# Patient Record
Sex: Female | Born: 1940 | Race: White | Hispanic: No | Smoking: Former smoker
Health system: Southern US, Community
[De-identification: ages and names within clinical notes are randomized; demographics above are authoritative.]

## PROBLEM LIST (undated history)

## (undated) DIAGNOSIS — I2510 Atherosclerotic heart disease of native coronary artery without angina pectoris: Secondary | ICD-10-CM

## (undated) DIAGNOSIS — Z9221 Personal history of antineoplastic chemotherapy: Secondary | ICD-10-CM

## (undated) DIAGNOSIS — K219 Gastro-esophageal reflux disease without esophagitis: Secondary | ICD-10-CM

## (undated) DIAGNOSIS — Z923 Personal history of irradiation: Secondary | ICD-10-CM

## (undated) DIAGNOSIS — M1990 Unspecified osteoarthritis, unspecified site: Secondary | ICD-10-CM

## (undated) DIAGNOSIS — F329 Major depressive disorder, single episode, unspecified: Secondary | ICD-10-CM

## (undated) DIAGNOSIS — E785 Hyperlipidemia, unspecified: Secondary | ICD-10-CM

## (undated) DIAGNOSIS — J449 Chronic obstructive pulmonary disease, unspecified: Secondary | ICD-10-CM

## (undated) DIAGNOSIS — R0600 Dyspnea, unspecified: Secondary | ICD-10-CM

## (undated) DIAGNOSIS — I10 Essential (primary) hypertension: Secondary | ICD-10-CM

## (undated) DIAGNOSIS — F32A Depression, unspecified: Secondary | ICD-10-CM

## (undated) DIAGNOSIS — I5022 Chronic systolic (congestive) heart failure: Secondary | ICD-10-CM

## (undated) DIAGNOSIS — F419 Anxiety disorder, unspecified: Secondary | ICD-10-CM

## (undated) DIAGNOSIS — C801 Malignant (primary) neoplasm, unspecified: Secondary | ICD-10-CM

## (undated) HISTORY — DX: Chronic systolic (congestive) heart failure: I50.22

## (undated) HISTORY — DX: Essential (primary) hypertension: I10

## (undated) HISTORY — DX: Atherosclerotic heart disease of native coronary artery without angina pectoris: I25.10

## (undated) HISTORY — PX: DILATION AND CURETTAGE OF UTERUS: SHX78

## (undated) HISTORY — DX: Chronic obstructive pulmonary disease, unspecified: J44.9

---

## 1988-04-24 HISTORY — PX: ABDOMINAL HYSTERECTOMY: SHX81

## 2014-04-08 ENCOUNTER — Emergency Department: Payer: Self-pay | Admitting: Emergency Medicine

## 2014-04-08 LAB — CBC
HCT: 42.6 % (ref 35.0–47.0)
HGB: 13.5 g/dL (ref 12.0–16.0)
MCH: 28.3 pg (ref 26.0–34.0)
MCHC: 31.7 g/dL — ABNORMAL LOW (ref 32.0–36.0)
MCV: 89 fL (ref 80–100)
Platelet: 267 10*3/uL (ref 150–440)
RBC: 4.76 10*6/uL (ref 3.80–5.20)
RDW: 14.8 % — ABNORMAL HIGH (ref 11.5–14.5)
WBC: 13.7 10*3/uL — AB (ref 3.6–11.0)

## 2014-04-08 LAB — BASIC METABOLIC PANEL
Anion Gap: 6 — ABNORMAL LOW (ref 7–16)
BUN: 11 mg/dL (ref 7–18)
CO2: 28 mmol/L (ref 21–32)
Calcium, Total: 8.8 mg/dL (ref 8.5–10.1)
Chloride: 107 mmol/L (ref 98–107)
Creatinine: 1.09 mg/dL (ref 0.60–1.30)
EGFR (African American): >60
EGFR (Non-African Amer.): 52 — ABNORMAL LOW
Glucose: 95 mg/dL (ref 65–99)
Osmolality: 280 (ref 275–301)
Potassium: 3.9 mmol/L (ref 3.5–5.1)
Sodium: 141 mmol/L (ref 136–145)

## 2014-04-08 LAB — PRO B NATRIURETIC PEPTIDE: B-TYPE NATIURETIC PEPTID: 407 pg/mL — AB (ref 0–125)

## 2014-04-08 LAB — TROPONIN I
Troponin-I: 0.03 ng/mL
Troponin-I: 0.03 ng/mL

## 2014-04-08 IMAGING — CR DG CHEST 2V
1 series · 2 of 2 positions shown · non-contrast
Comparison: None.

CLINICAL DATA: 73-year-old female with right side chest pain since
[REDACTED], pain now radiating to the left. Initial encounter.

EXAM:
CHEST  2 VIEW

[Series 1: dxr chest pa (or ap) and lateral · 0.14mm/px · 2 of 2 slices shown]
[im 1/2]
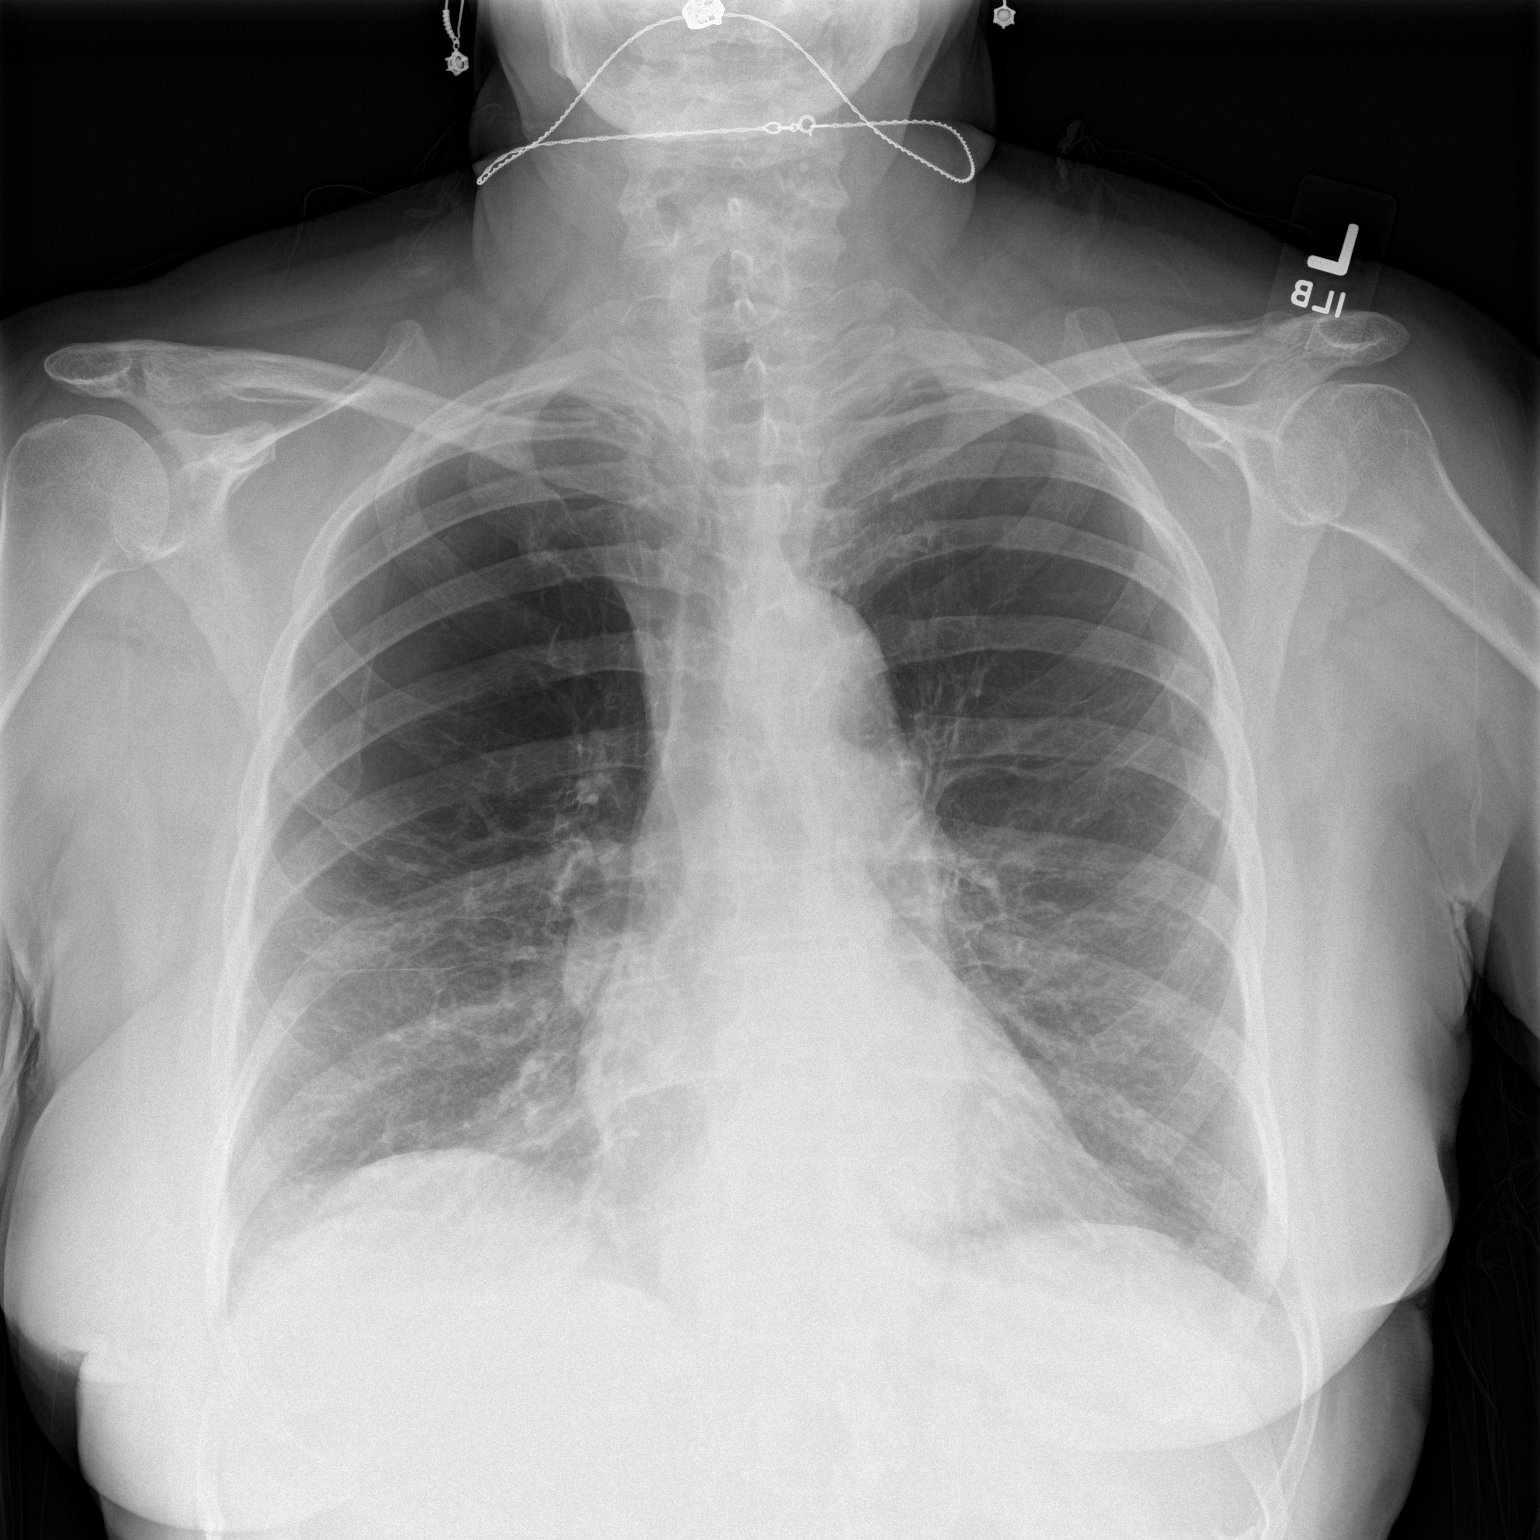
[im 2/2]
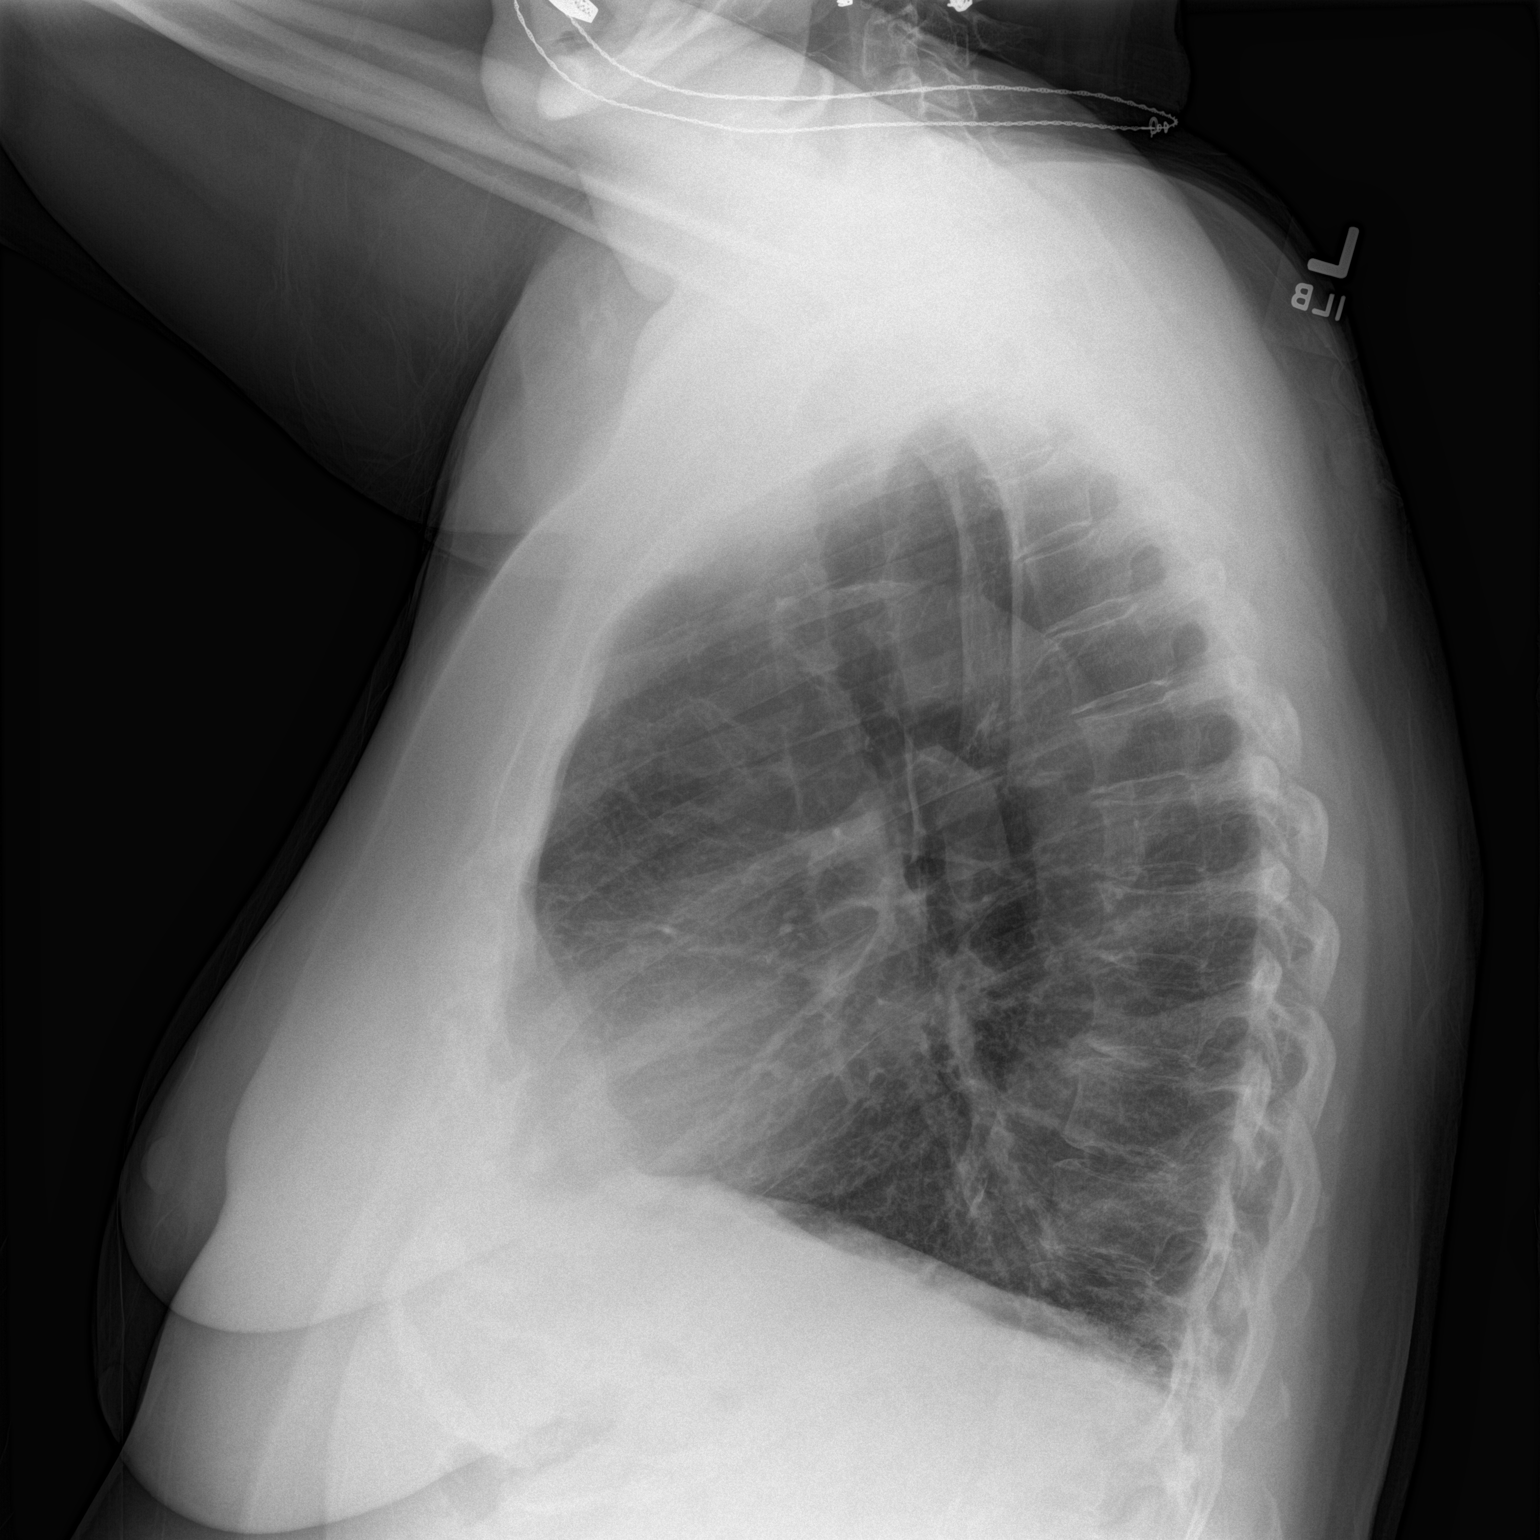

[2 of 2 positions shown; findings below may reference images not displayed]

FINDINGS: Attenuation of upper lobe bronchovascular markings compatible with
emphysema. Somewhat large lung volumes with flattening of the
diaphragm on the lateral view. Normal cardiac size and mediastinal
contours. Visualized tracheal air column is within normal limits. No
pneumothorax, pulmonary edema, pleural effusion or confluent
pulmonary opacity. Calcified atherosclerosis of the aorta.
Osteopenia. No acute osseous abnormality identified.
IMPRESSION: Emphysema.  No acute cardiopulmonary abnormality.

## 2014-04-08 IMAGING — CT CT ANGIO CHEST
2 of 6 series · 18 of 36 positions shown · IV contrast (APPLIED)
Comparison: Chest radiographs [T8] hr today.

CLINICAL DATA: 73-year-old female with chest pain radiating to the
right upper extremity. Shortness of Breath. Initial encounter.

EXAM:
CT ANGIOGRAPHY CHEST WITH CONTRAST
TECHNIQUE: Multidetector CT imaging of the chest was performed using the
standard protocol during bolus administration of intravenous
contrast. Multiplanar CT image reconstructions and MIPs were
obtained to evaluate the vascular anatomy.
CONTRAST:  100 mL Isovue 370

[Series 6: pe 1.0 thins · axial · 0.68mm/px · z∈[+652,+918]mm · 17 of 301 slices shown]
[im 17/301  lung]
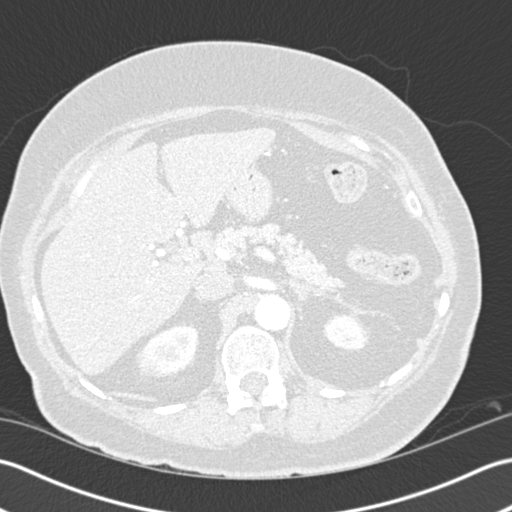
[im 34/301  mediastinal]
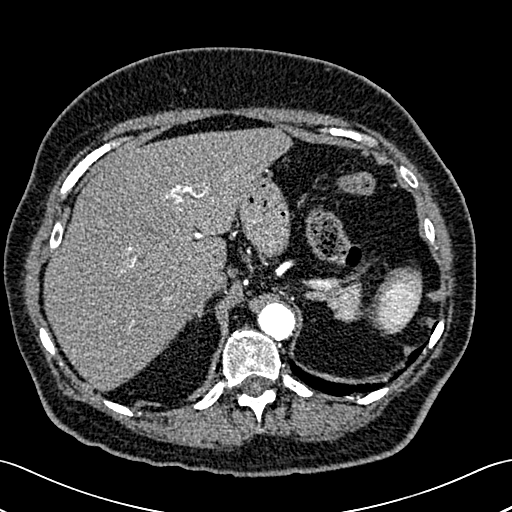
[im 51/301  lung]
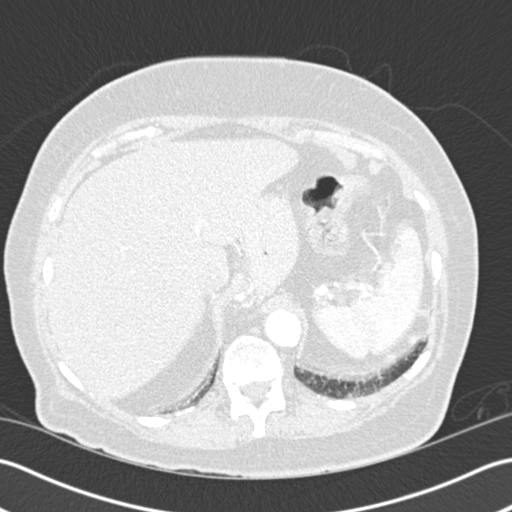
[im 67/301  mediastinal]
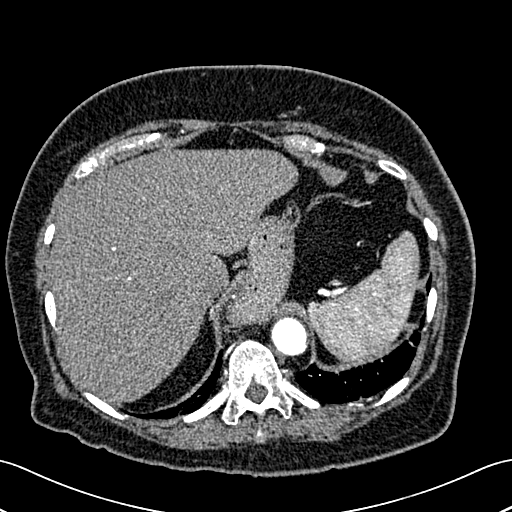
[im 84/301  lung]
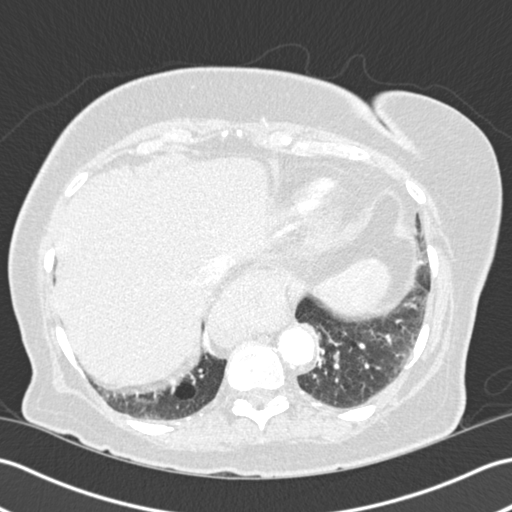
[im 101/301  mediastinal]
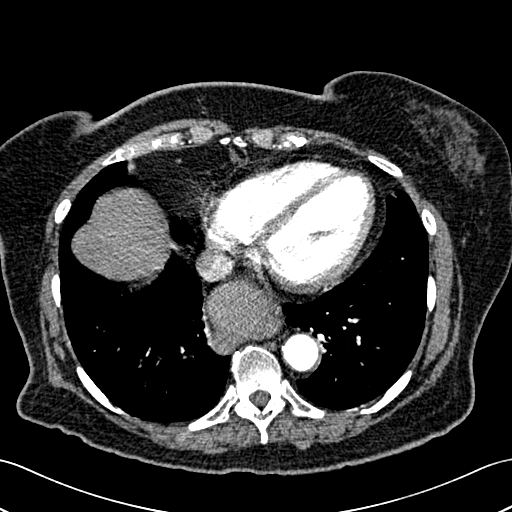
[im 117/301  lung]
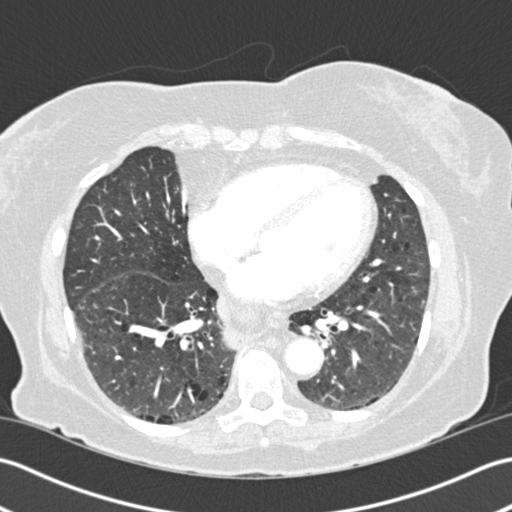
[im 134/301  mediastinal]
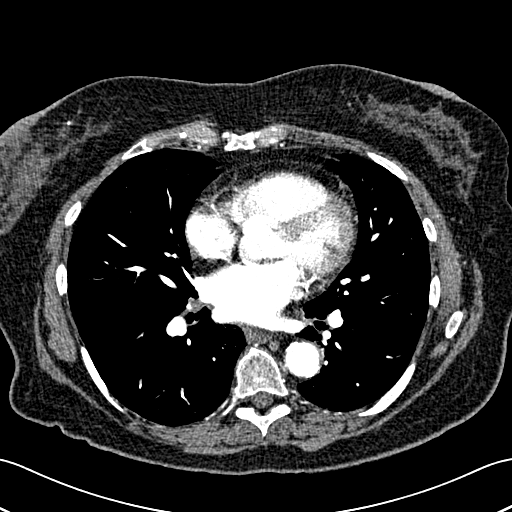
[im 151/301  lung]
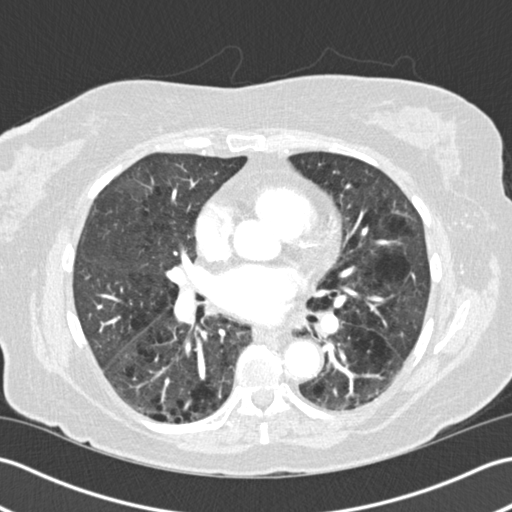
[im 167/301  mediastinal]
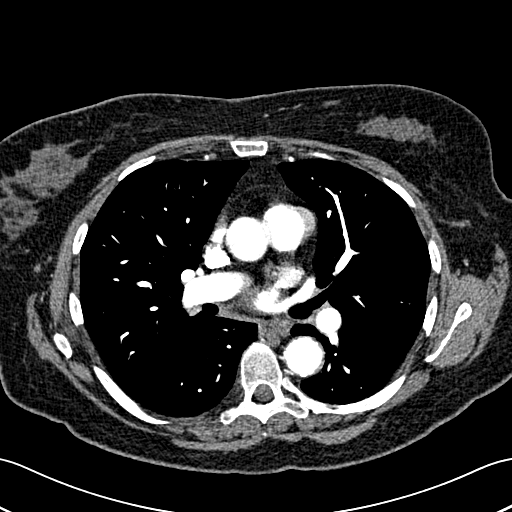
[im 184/301  lung]
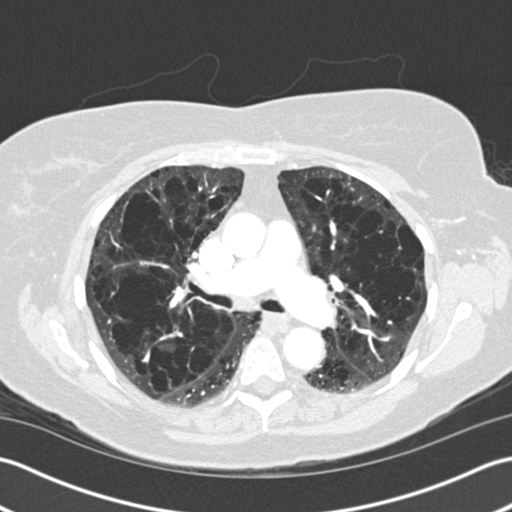
[im 201/301  mediastinal]
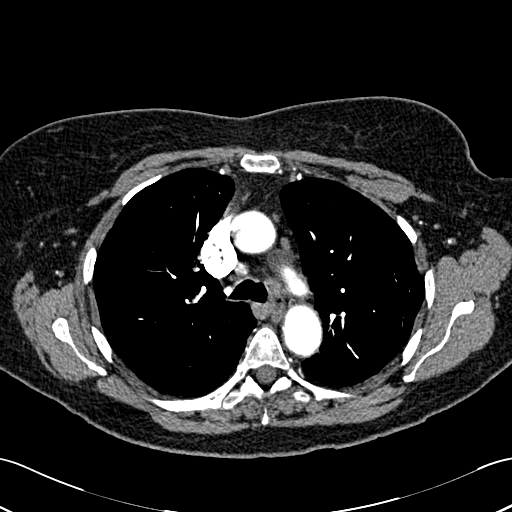
[im 217/301  lung]
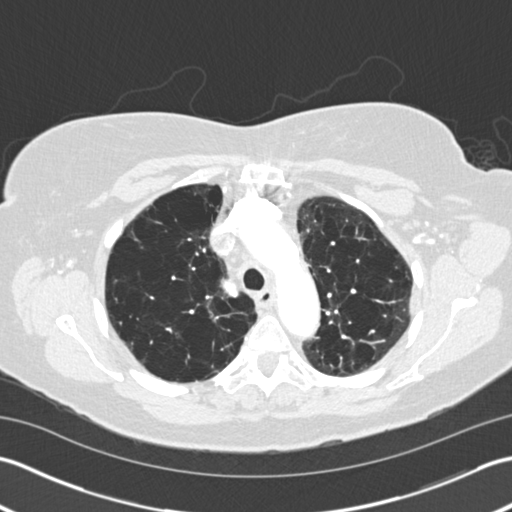
[im 234/301  mediastinal]
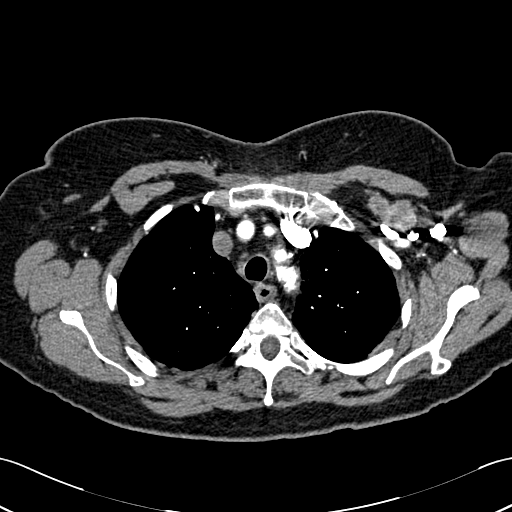
[im 251/301  lung]
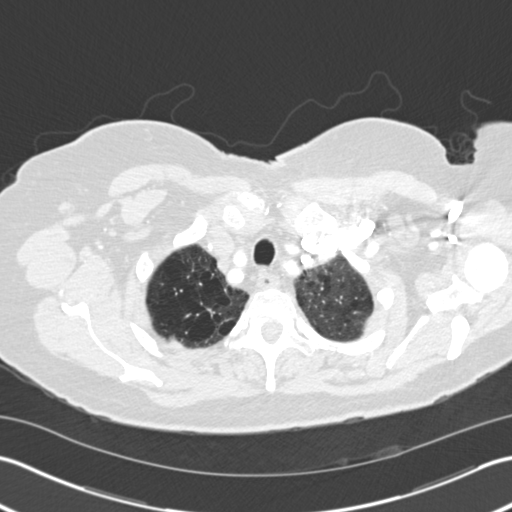
[im 267/301  mediastinal]
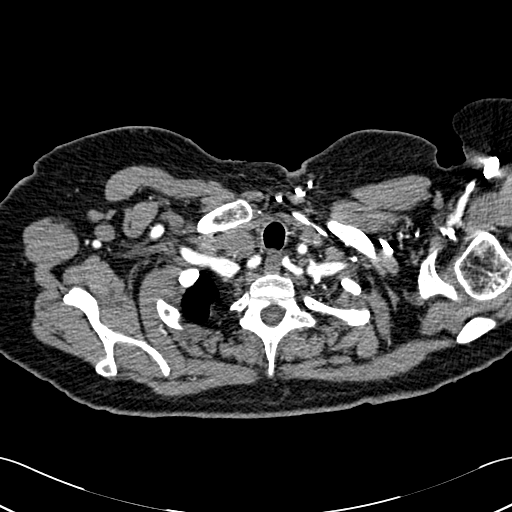
[im 284/301  lung]
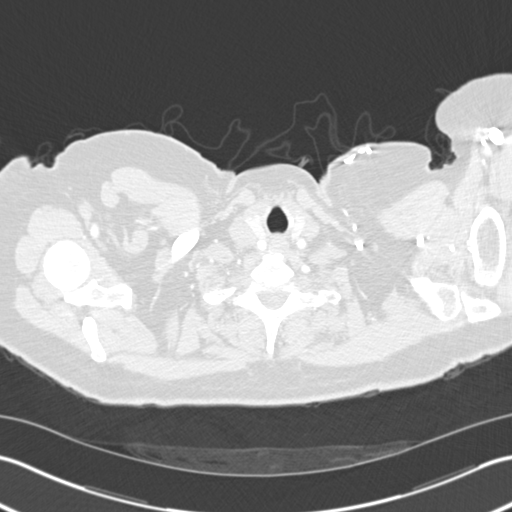

[Series 8: cor pe 2.0 mpr · coronal · 0.59mm/px · 1 of 152 slices shown]
[im 76/152  mediastinal]
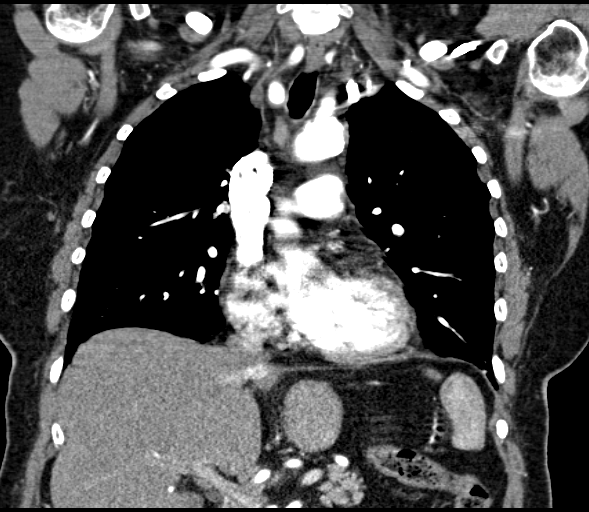

[18 of 36 positions shown; findings below may reference images not displayed]

FINDINGS: Good contrast bolus timing in the pulmonary arterial tree. Motion
artifact in the lower lobes. Some enlargement of central pulmonary
arteries with fairly rapid taper ring, especially in the upper
lobes. No focal filling defect identified in the pulmonary arterial
tree to suggest the presence of acute pulmonary embolism.

Bullous emphysema in both upper lungs. Centrilobular emphysema
elsewhere. Major airways are patent. Crowding of markings in the
costophrenic angles most resembles atelectasis. However, there are
tree-in-bud opacities in the lateral basal segment of the right
lower lobe (series 7, image 94). No consolidation. Calcified
granuloma in the right lung on series 7, image 78. 4 mm subpleural
nodule in the left costophrenic angle on image 114. No other
pulmonary nodule identified.

Mildly increased hilar nodal tissue. No pericardial or pleural
effusion. Mildly increased mediastinal nodes, appear reactive.
Negative thoracic inlet. Moderate-sized retrocardiac hiatal hernia.
Calcified atherosclerosis of the aorta and its branches, including
the proximal great vessels and the coronary arteries.

No axillary lymphadenopathy. Visible liver, gallbladder, spleen,
pancreas, adrenal glands, kidneys, and large bowel in the upper
abdomen are within normal limits.

No acute osseous abnormality identified.

Review of the MIP images confirms the above findings.
IMPRESSION: 1.  No evidence of acute pulmonary embolus.
2. Emphysema is moderate to severe with bullous disease in both
upper lobes. Tree-in-bud nodularity in the lateral basal segment of
the right lower lobe suspicious for acute distal airway infection.
3. Four mm left costophrenic angle nodule. Follow-up chest CT at 1
year is recommended. This recommendation follows the consensus
statement: Guidelines for Management of Small Pulmonary Nodules
Detected on CT Scans: A Statement from the [HOSPITAL] as
published in Radiology [T8]; [DATE].
4. Aortic and Coronary calcified atherosclerosis.

## 2014-04-09 ENCOUNTER — Ambulatory Visit: Payer: Self-pay | Admitting: Family Medicine

## 2014-04-09 IMAGING — CT CT ABD-PELV W/ CM
2 of 5 series · 16 of 46 positions shown, 18 images · IV contrast (omnipaque)
Comparison: None.

CLINICAL DATA: Left upper quadrant pain radiating to right upper
quadrant

EXAM:
CT ABDOMEN AND PELVIS WITH CONTRAST
TECHNIQUE: Multidetector CT imaging of the abdomen and pelvis was performed
using the standard protocol following bolus administration of
intravenous contrast.
CONTRAST:  100 cc Omnipaque 300

[Series 3: routine abd pel with · axial · 0.76mm/px · z∈[-436,-10]mm · 13 of 97 slices shown, 15 images]
[im 6/97  soft-tissue]
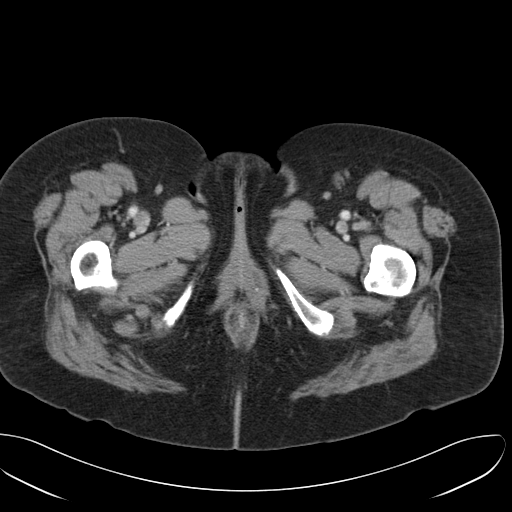
[im 6/97  bone]
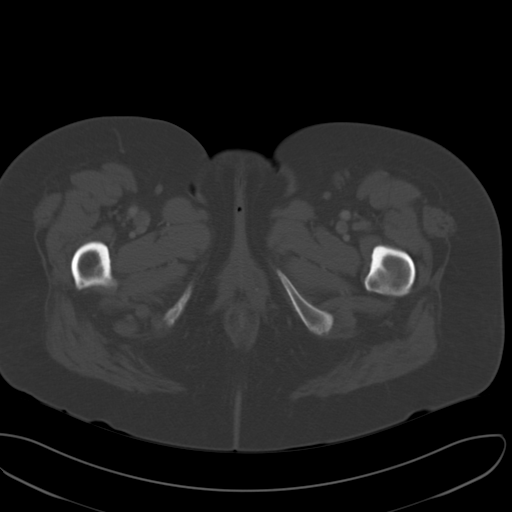
[im 16/97  soft-tissue]
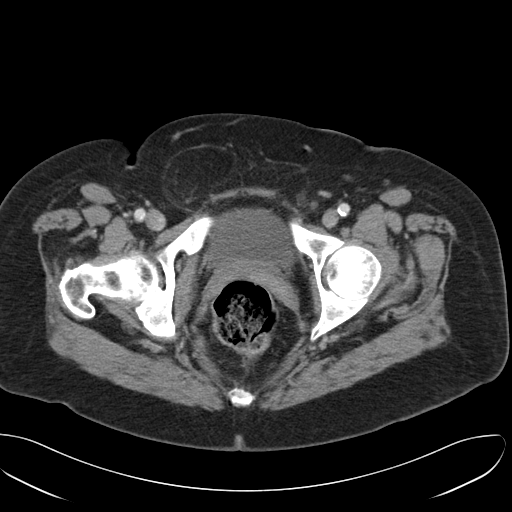
[im 21/97  soft-tissue]
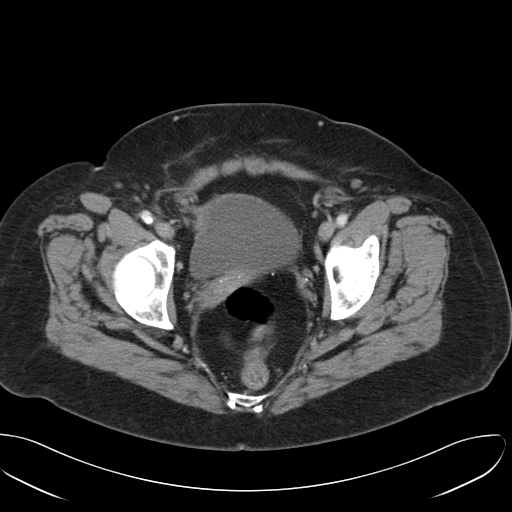
[im 26/97  soft-tissue]
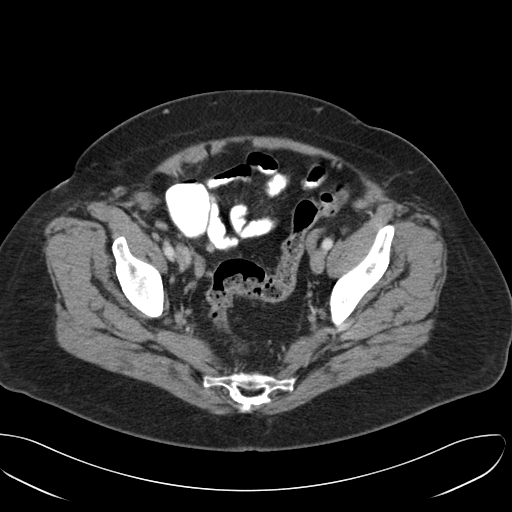
[im 36/97  soft-tissue]
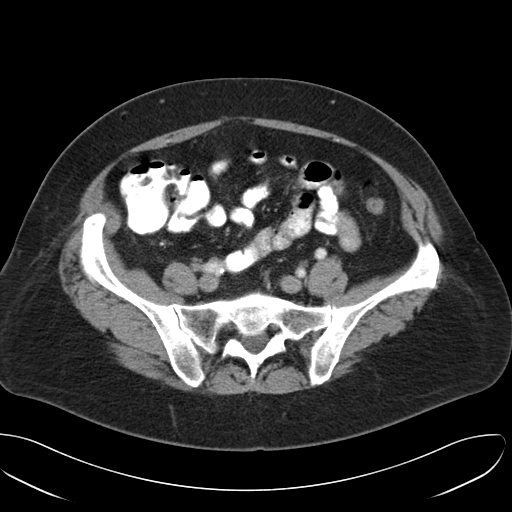
[im 41/97  soft-tissue]
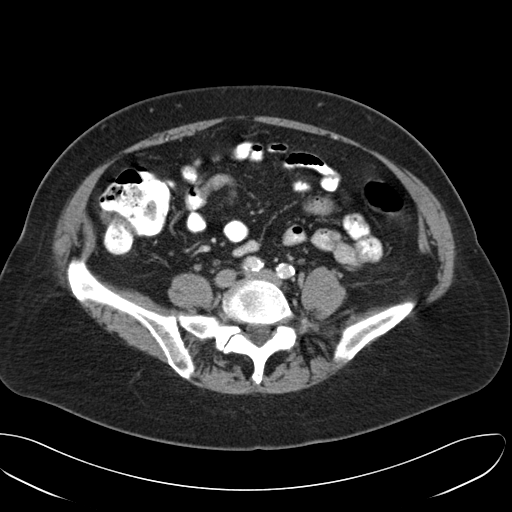
[im 51/97  soft-tissue]
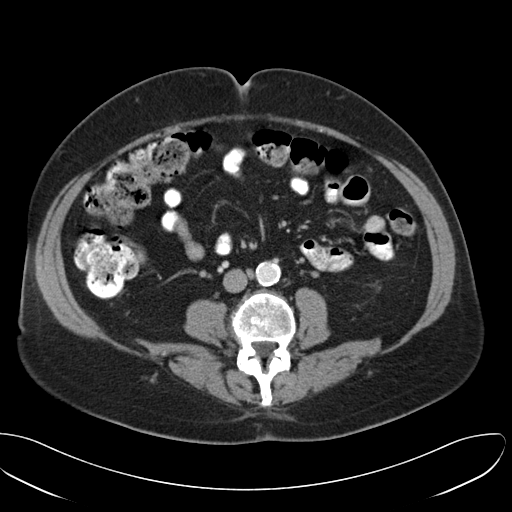
[im 56/97  soft-tissue]
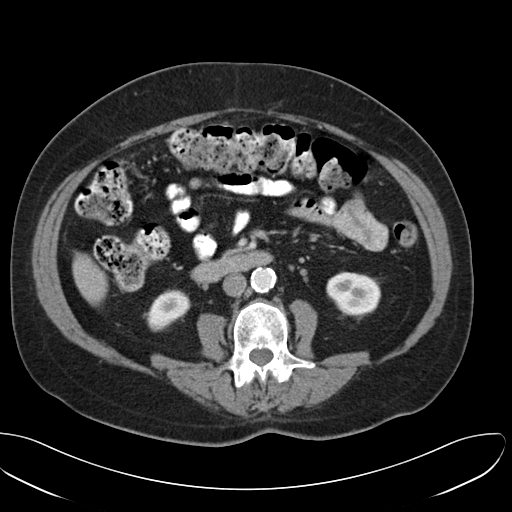
[im 61/97  soft-tissue]
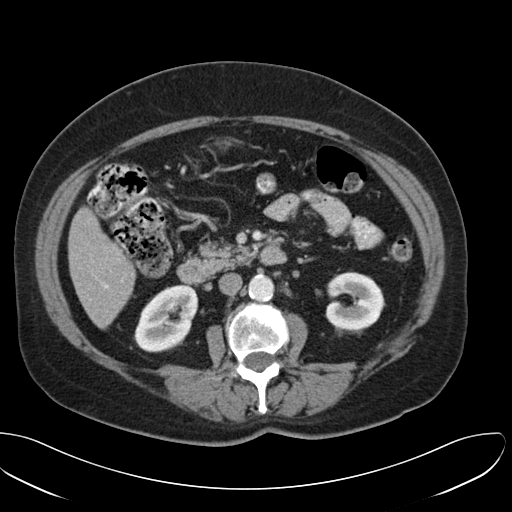
[im 61/97  bone]
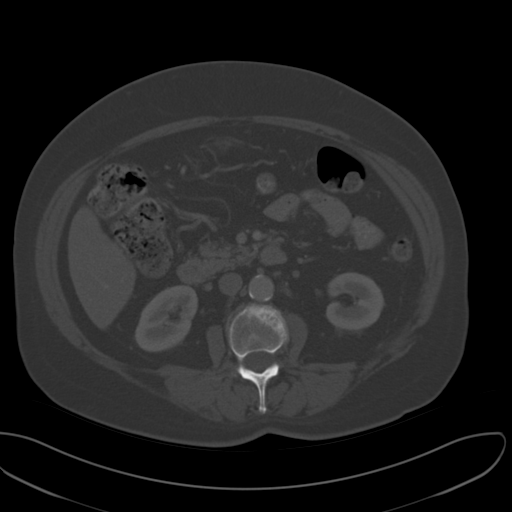
[im 71/97  soft-tissue]
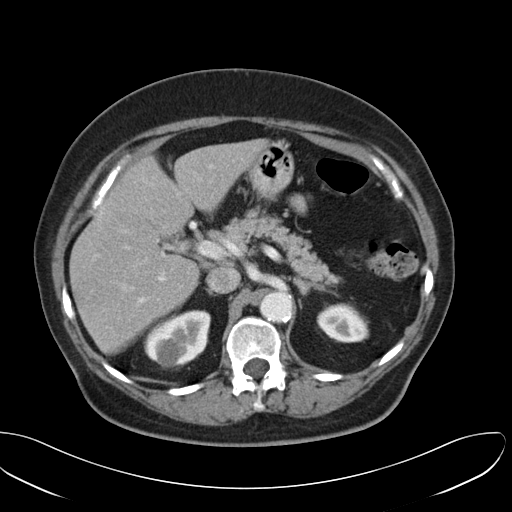
[im 76/97  soft-tissue]
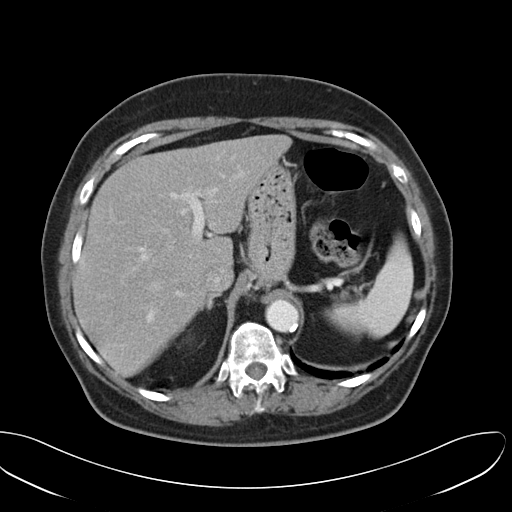
[im 81/97  soft-tissue]
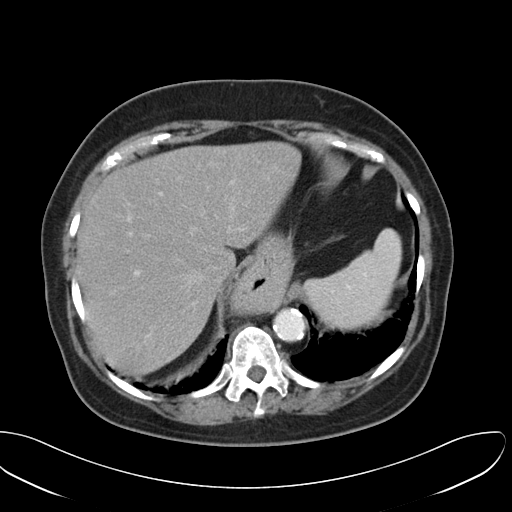
[im 91/97  soft-tissue]
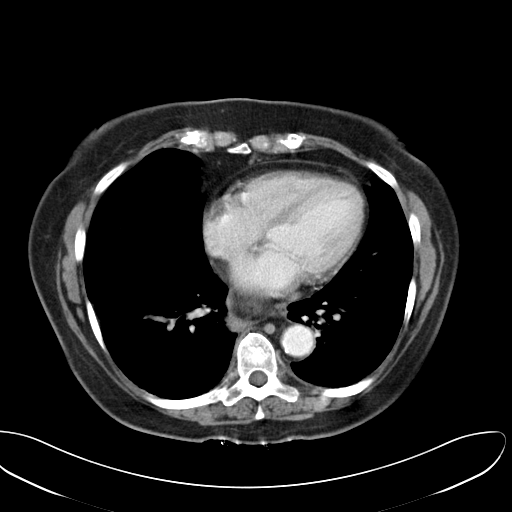

[Series 6: cor routine abd pel with · coronal · 0.77mm/px · 3 of 176 slices shown]
[im 59/176  soft-tissue]
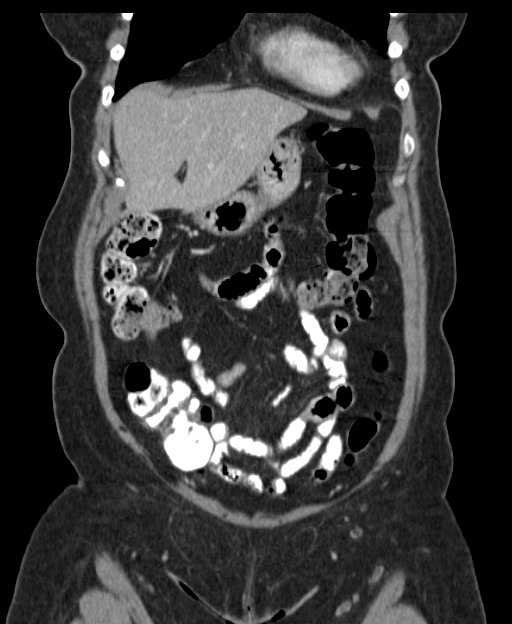
[im 78/176  soft-tissue]
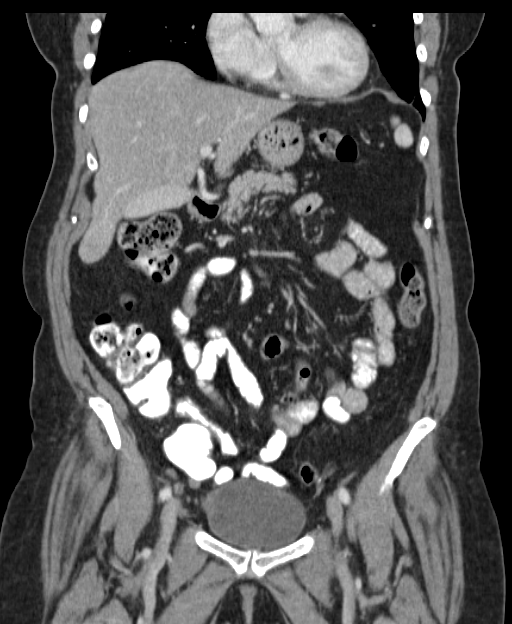
[im 98/176  soft-tissue]
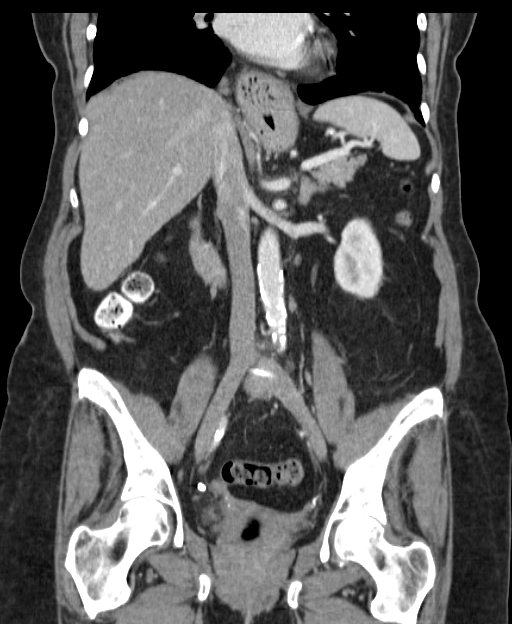

[16 of 46 positions shown; findings below may reference images not displayed]

FINDINGS: Changes of centrilobular emphysema are noted at the lung bases.
Coronary artery calcifications are present. A moderate size hiatal
hernia is noted. The liver enhances and is somewhat low in
attenuation possibly indicating mild fatty infiltration. Some
contrast is noted within the gallbladder which may indicate
vicarious excretion although a small gallstones are difficult to
exclude. No gallbladder wall thickening is seen. The pancreas is
normal in size in the pancreatic duct is not dilated. The adrenal
glands and spleen are unremarkable. The more distal stomach is
decompressed. The kidneys enhance, and there does appear to be a
cyst in the upper pole of the right kidney of 2.0 cm with
attenuation of 21 HU. No solid renal lesion is seen. On delayed
images the pelvocaliceal systems are unremarkable. The proximal
ureters are normal in caliber pre the abdominal aorta is normal in
caliber with moderate atheromatous change present. No aneurysm is
noted. No adenopathy is seen.

The urinary bladder is unremarkable. The uterus appears to have been
resected. No adnexal lesion is seen. No fluid is noted within the
pelvis. Fat does enter the right inguinal canal. The terminal ileum
and the appendix are unremarkable. Diffuse degenerative disc disease
is noted in the lumbar spine.
IMPRESSION: 1. Moderate sized hiatal hernia.
2. Somewhat low-attenuation of the liver may indicate fatty
infiltration. Correlate with LFTs.
3. Probable vicarious excretion of contrast into the gallbladder.
Small gallstones cannot be excluded.
4. Centrilobular emphysema.
5. Fat enters the right inguinal canal.

## 2014-04-10 DIAGNOSIS — E669 Obesity, unspecified: Secondary | ICD-10-CM | POA: Insufficient documentation

## 2014-04-10 DIAGNOSIS — F329 Major depressive disorder, single episode, unspecified: Secondary | ICD-10-CM | POA: Insufficient documentation

## 2014-04-10 DIAGNOSIS — J309 Allergic rhinitis, unspecified: Secondary | ICD-10-CM | POA: Insufficient documentation

## 2014-04-10 DIAGNOSIS — F32A Depression, unspecified: Secondary | ICD-10-CM | POA: Insufficient documentation

## 2014-04-28 ENCOUNTER — Ambulatory Visit: Payer: Self-pay | Admitting: Family Medicine

## 2014-04-28 IMAGING — US ABDOMEN ULTRASOUND LIMITED
1 series · 14 of 25 positions shown · non-contrast
Comparison: CT scan of the abdomen pelvis of [DATE]

CLINICAL DATA: Right upper quadrant pain evaluate for gallstones
suspected on prior CT scan.

EXAM:
US ABDOMEN LIMITED - RIGHT UPPER QUADRANT

[Series 1: abdomen ultrasound limited · 0.21mm/px · 14 of 48 slices shown]
[im 1/48]
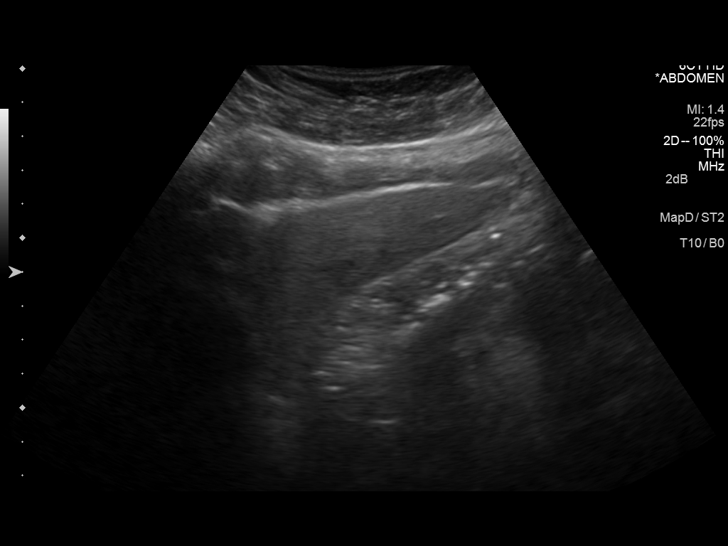
[im 4/48]
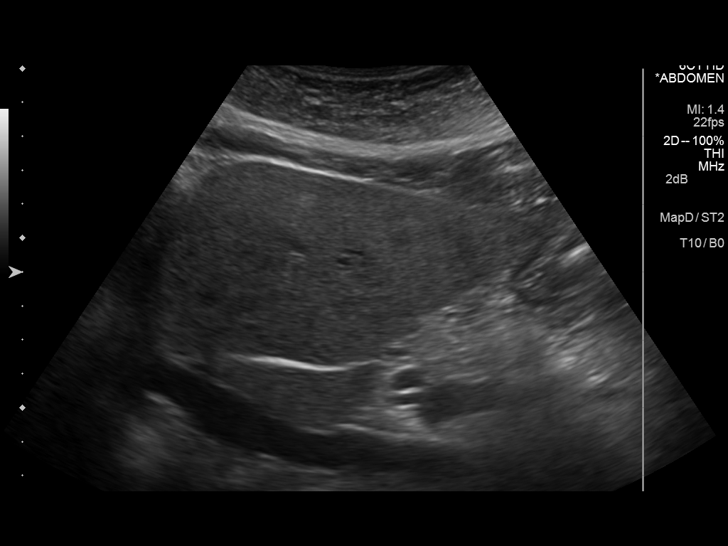
[im 8/48]
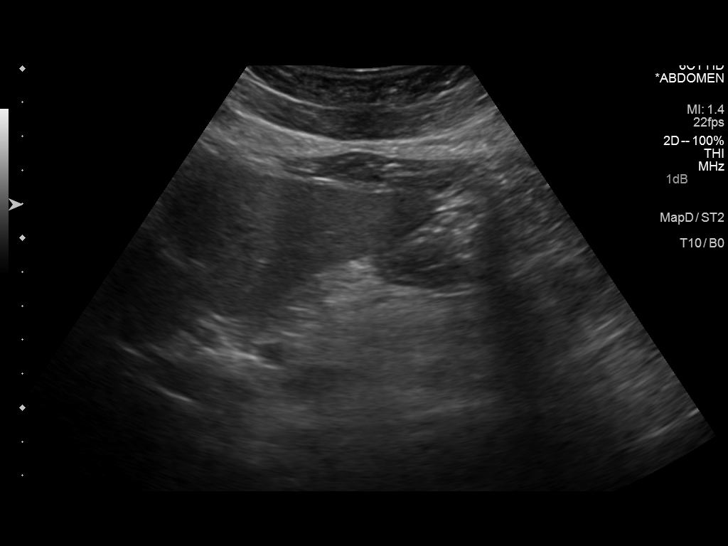
[im 12/48]
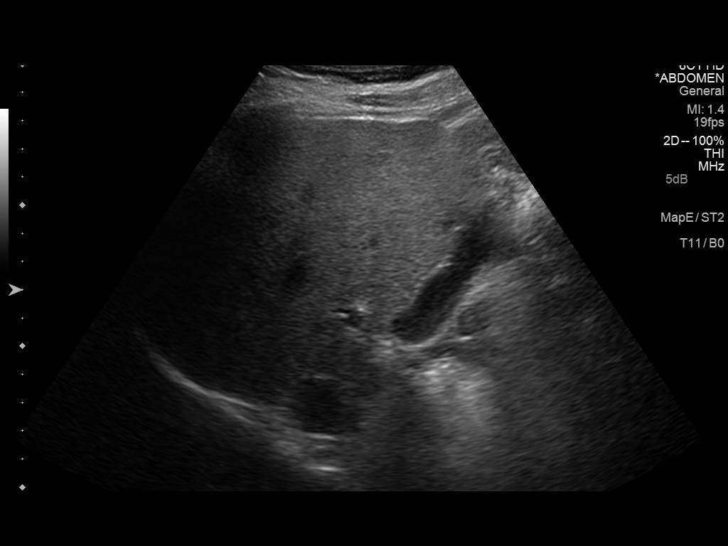
[im 16/48]
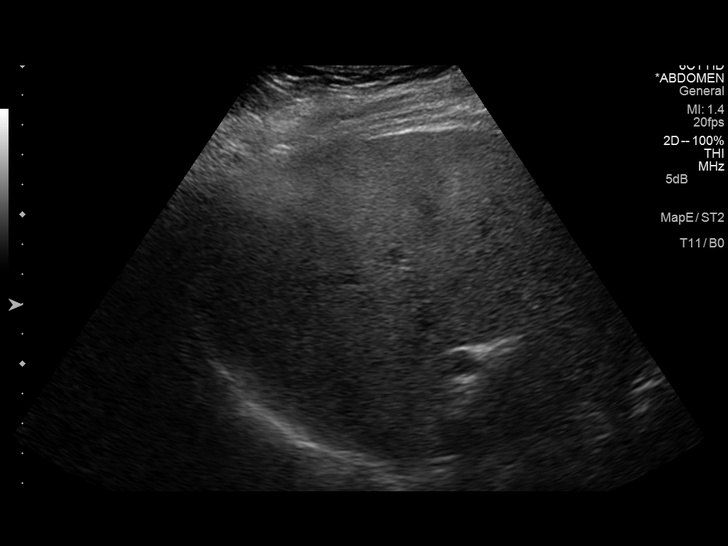
[im 18/48]
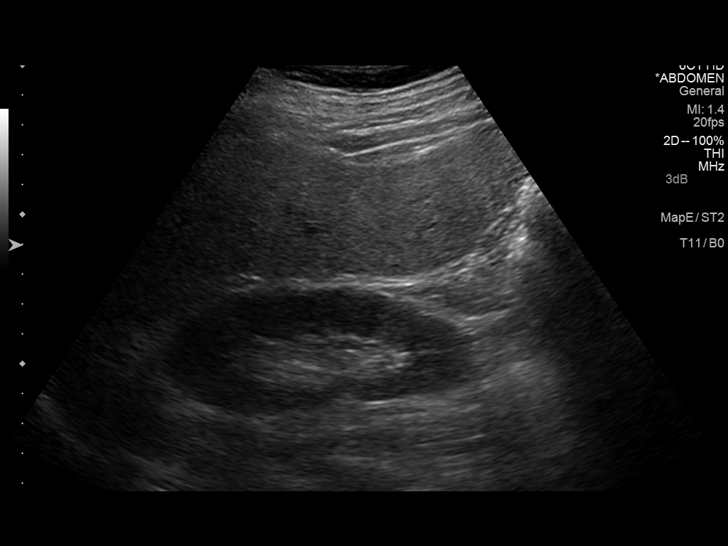
[im 22/48]
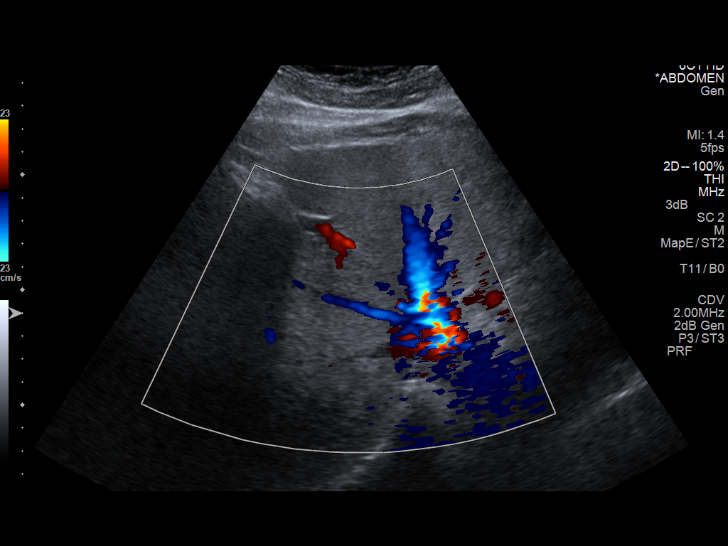
[im 26/48]
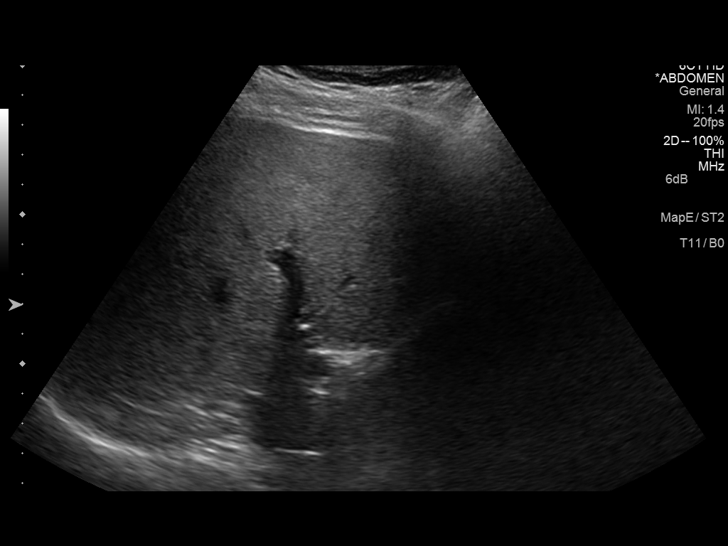
[im 30/48]
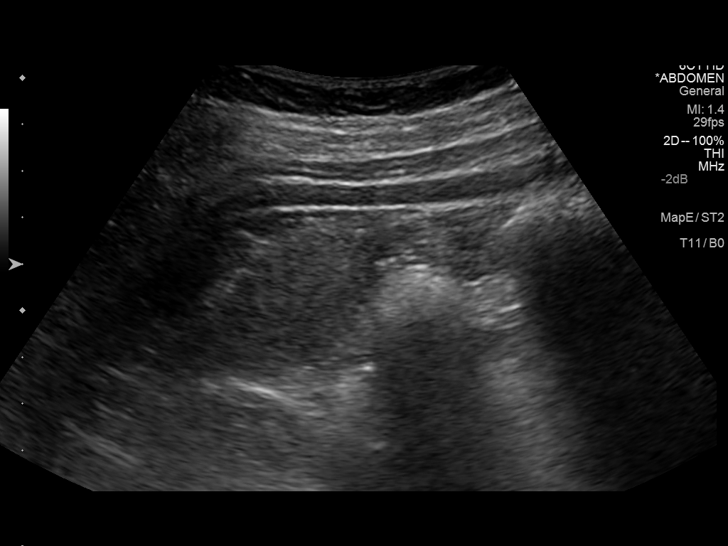
[im 32/48]
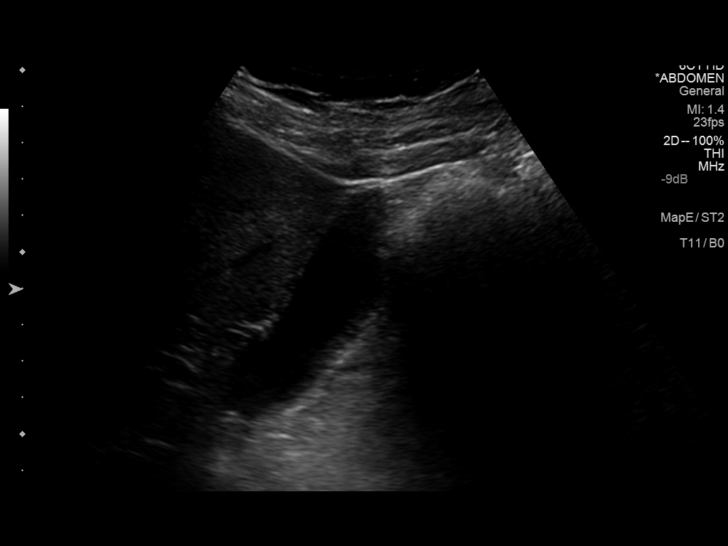
[im 36/48]
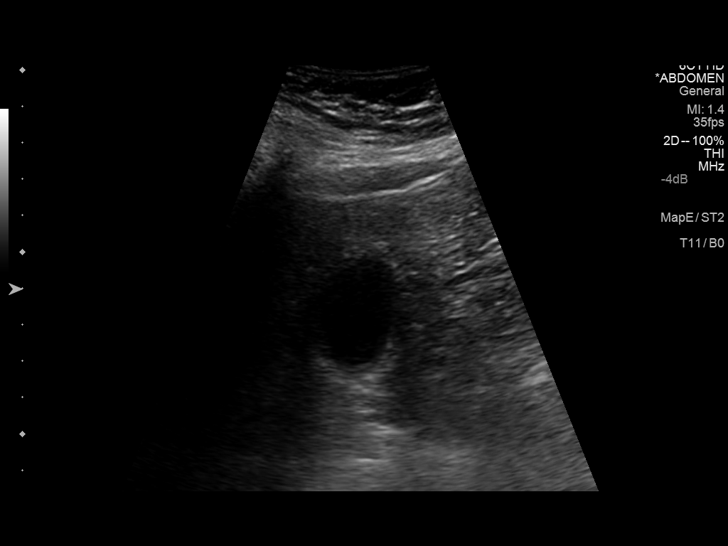
[im 40/48]
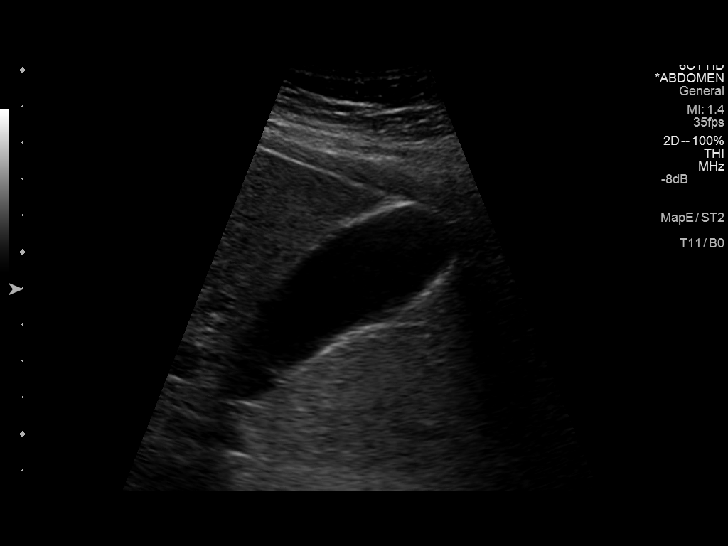
[im 44/48]
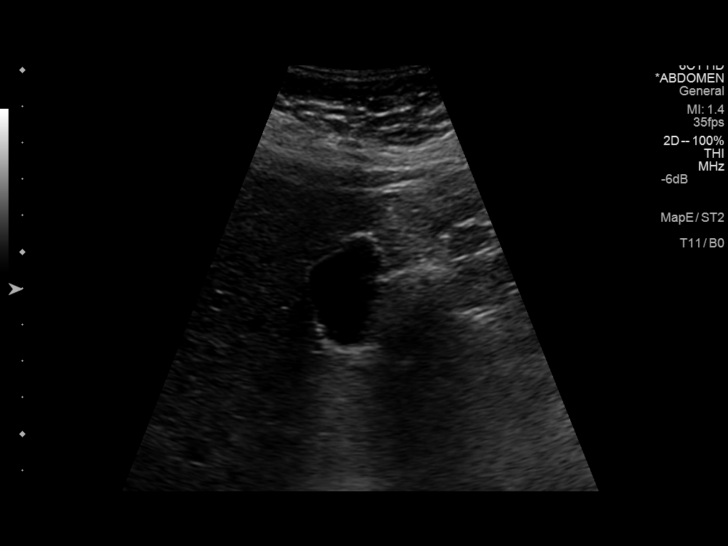
[im 48/48]
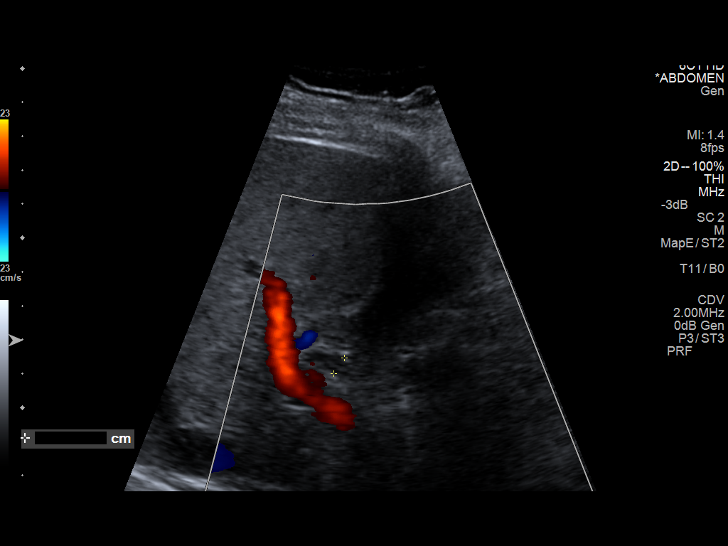

[14 of 25 positions shown; findings below may reference images not displayed]

FINDINGS: Gallbladder:

The gallbladder is adequately distended with no evidence of stones,
wall thickening, or pericholecystic fluid. There is no positive
sonographic Murphy's sign.

Common bile duct:

Diameter: 5.6 mm

Liver:

The liver exhibits increased echotexture diffusely a. There is no
focal mass or ductal dilation.
IMPRESSION: 1. No gallstones or other gallbladder pathology is demonstrated.
2. There is likely fatty infiltrative change of the liver.

## 2014-12-15 ENCOUNTER — Other Ambulatory Visit: Payer: Self-pay

## 2014-12-15 MED ORDER — DULOXETINE HCL 60 MG PO CPEP: 60.0000 mg | ORAL_CAPSULE | Freq: Every day | ORAL | Status: DC

## 2014-12-15 NOTE — Telephone Encounter (Signed)
Patient was last seen on 01/28/14, practice partner number is [phone], and pharmacy is [facility] in [place].

## 2014-12-15 NOTE — Telephone Encounter (Signed)
Needs seen further refills 

## 2014-12-16 NOTE — Telephone Encounter (Signed)
Called and left voicemail for pt to call back and schedule a follow up appointment.

## 2015-04-02 ENCOUNTER — Other Ambulatory Visit: Payer: Self-pay | Admitting: Unknown Physician Specialty

## 2015-04-19 ENCOUNTER — Emergency Department
Admission: EM | Admit: 2015-04-19 | Discharge: 2015-04-19 | Disposition: A | Discharge status: Home / Self Care | Payer: Medicare PPO | Attending: Emergency Medicine | Admitting: Emergency Medicine

## 2015-04-19 ENCOUNTER — Emergency Department: Payer: Medicare PPO

## 2015-04-19 ENCOUNTER — Encounter: Payer: Self-pay | Admitting: Emergency Medicine

## 2015-04-19 DIAGNOSIS — Z79899 Other long term (current) drug therapy: Secondary | ICD-10-CM | POA: Diagnosis not present

## 2015-04-19 DIAGNOSIS — J069 Acute upper respiratory infection, unspecified: Secondary | ICD-10-CM | POA: Diagnosis not present

## 2015-04-19 DIAGNOSIS — J439 Emphysema, unspecified: Secondary | ICD-10-CM | POA: Insufficient documentation

## 2015-04-19 DIAGNOSIS — Z87891 Personal history of nicotine dependence: Secondary | ICD-10-CM | POA: Insufficient documentation

## 2015-04-19 DIAGNOSIS — J988 Other specified respiratory disorders: Secondary | ICD-10-CM

## 2015-04-19 DIAGNOSIS — R030 Elevated blood-pressure reading, without diagnosis of hypertension: Secondary | ICD-10-CM | POA: Diagnosis not present

## 2015-04-19 DIAGNOSIS — Z880 Allergy status to penicillin: Secondary | ICD-10-CM | POA: Insufficient documentation

## 2015-04-19 DIAGNOSIS — J9811 Atelectasis: Secondary | ICD-10-CM | POA: Diagnosis not present

## 2015-04-19 DIAGNOSIS — R05 Cough: Secondary | ICD-10-CM | POA: Diagnosis present

## 2015-04-19 IMAGING — CR DG CHEST 2V
1 series · 2 of 2 positions shown · non-contrast
Comparison: CT chest [DATE]

CLINICAL DATA: Wheezing and productive cough for 3 days.

EXAM:
CHEST - 2 VIEW

[Series 1: dg chest 2 view · 0.14mm/px · 2 of 2 slices shown]
[im 1/2]
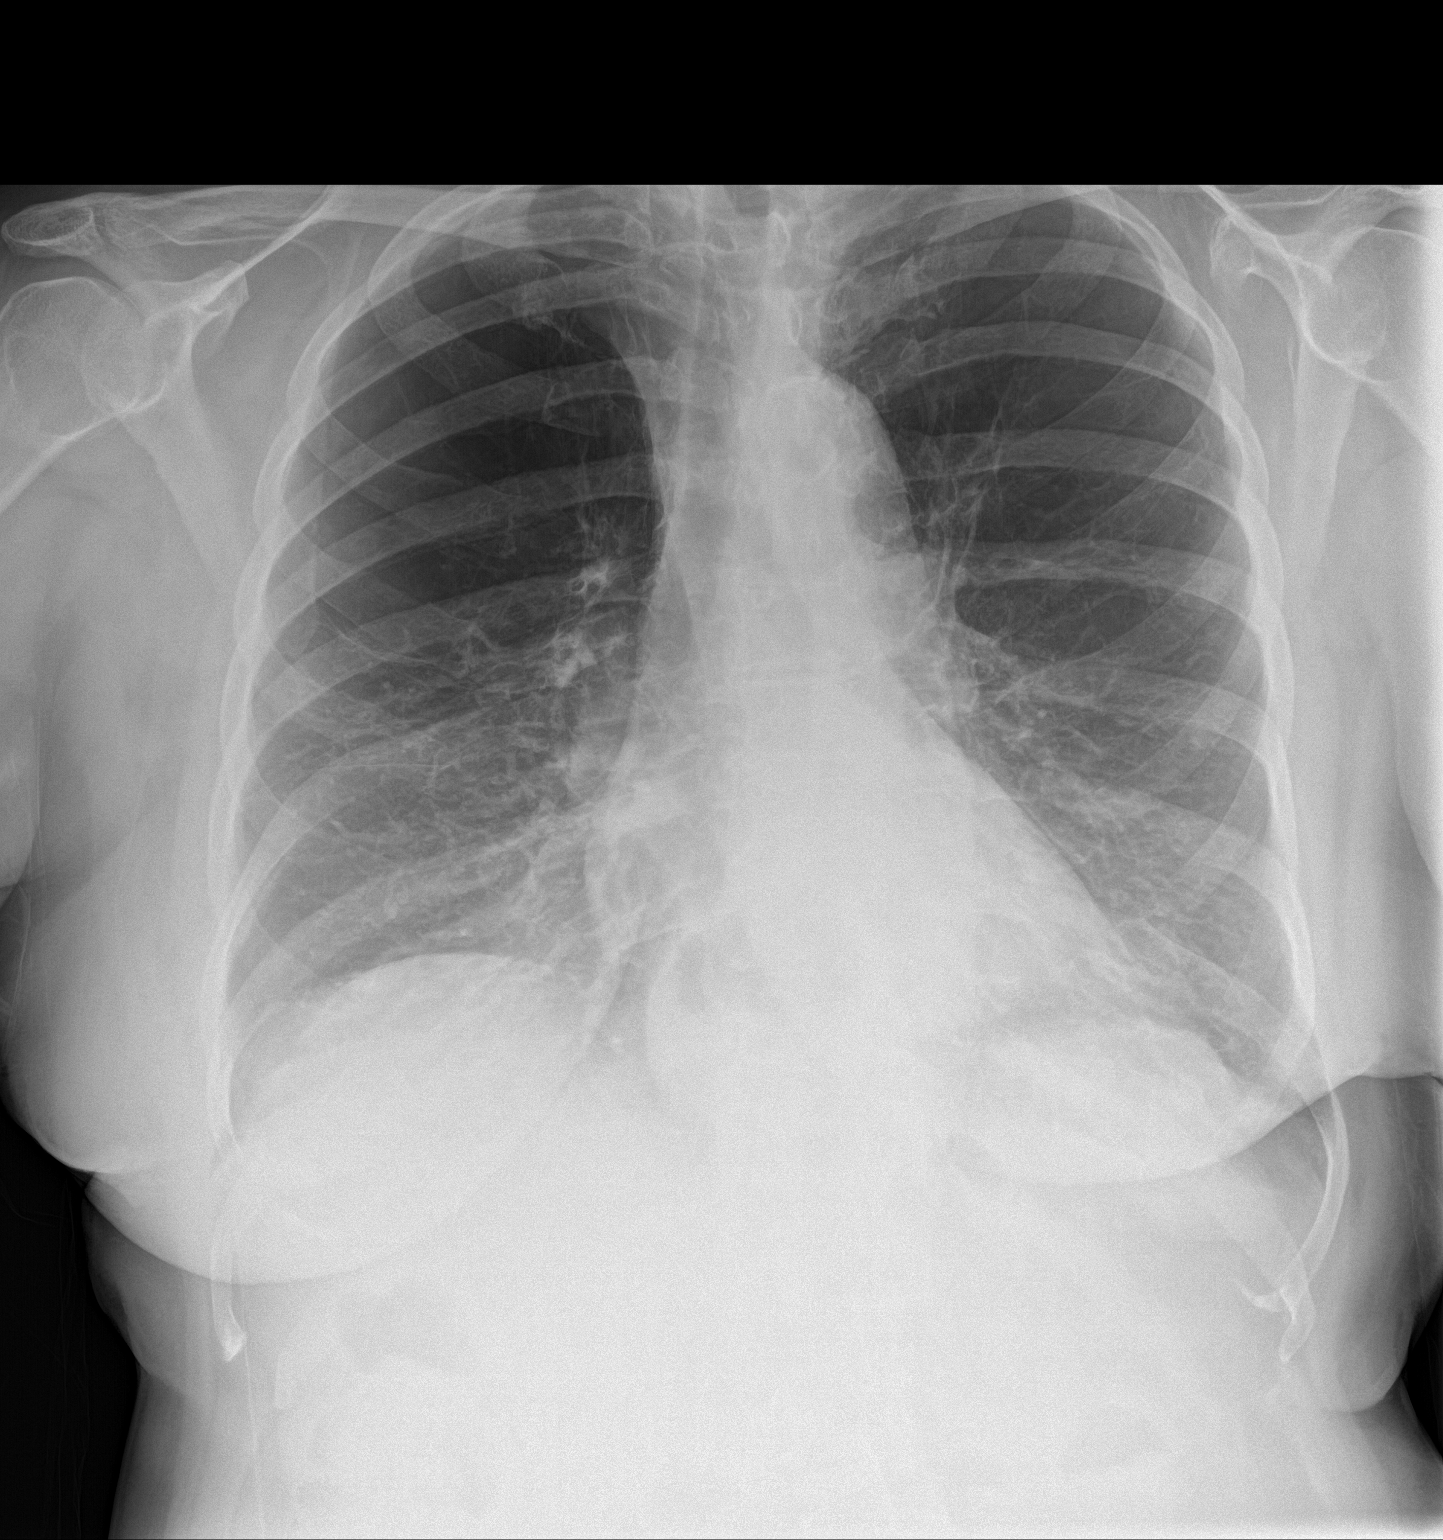
[im 2/2]
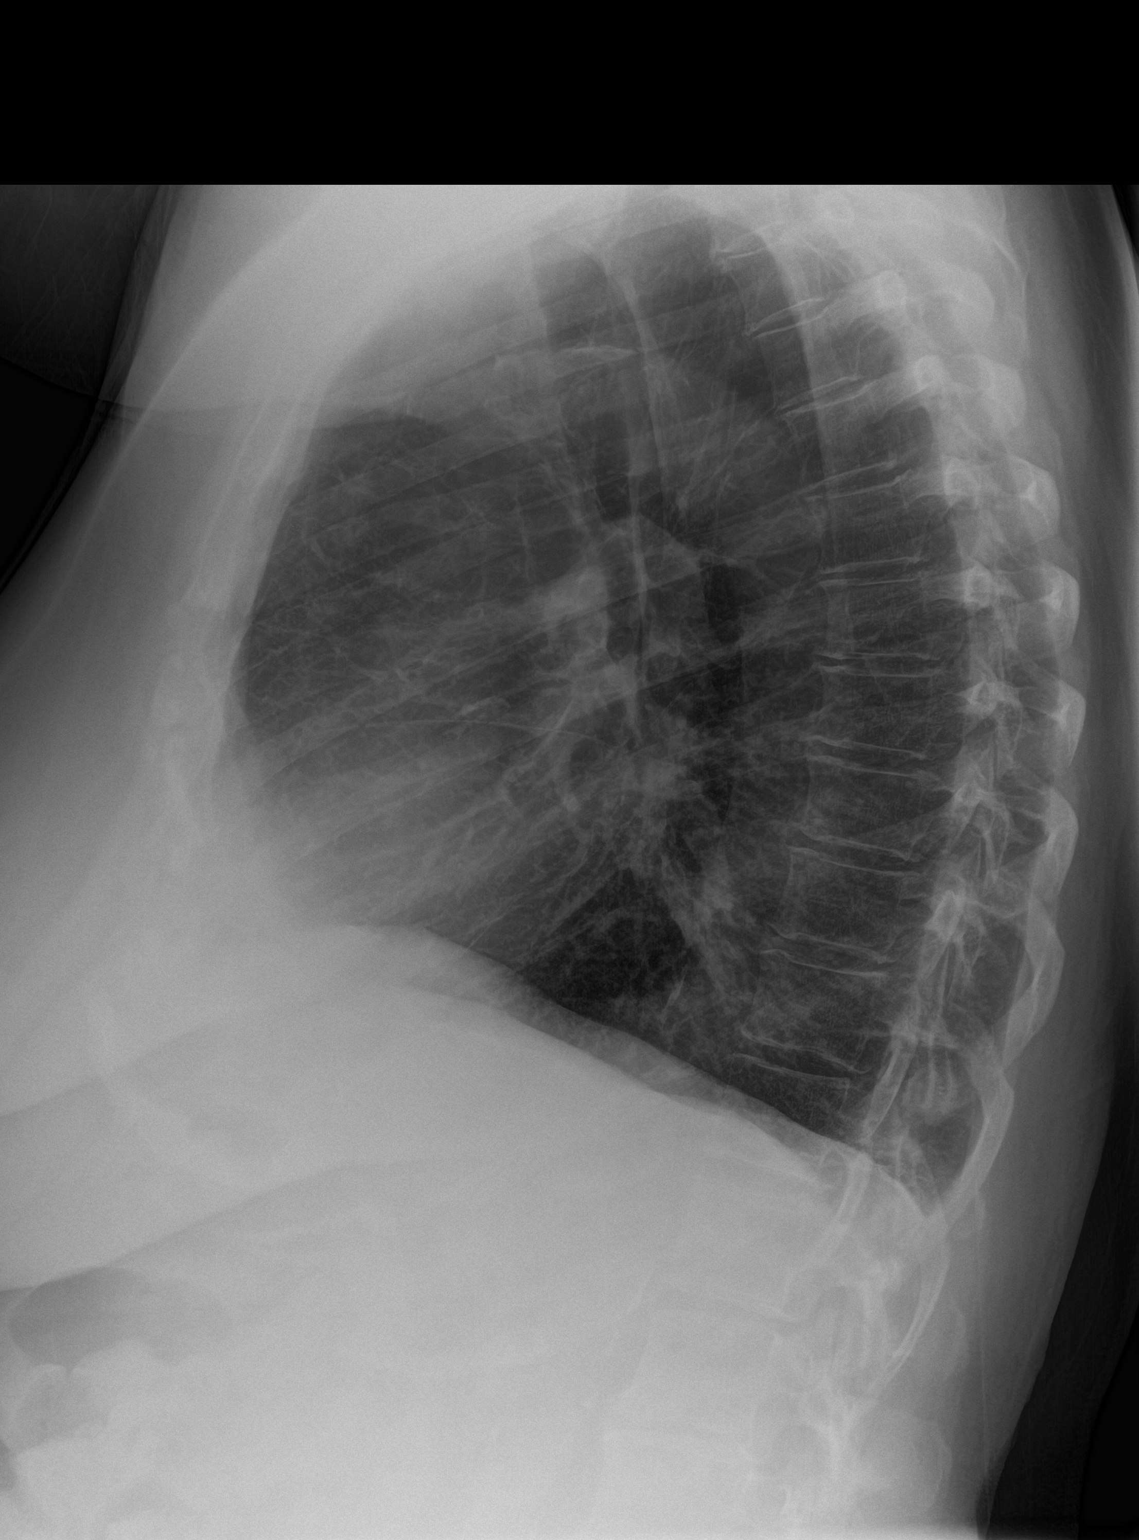

[2 of 2 positions shown; findings below may reference images not displayed]

FINDINGS: The heart is upper limits of normal for size. Ill-defined right
lower lobe airspace disease is present. There is chronic scarring
and atelectasis at the bases bilaterally. Emphysematous changes are
again noted. Atherosclerotic calcifications are present at the
aortic arch. The visualized soft tissues and bony thorax are
unremarkable.
IMPRESSION: 1. Ill-defined airspace disease at the right lung base may reflect
atelectasis.
2. Emphysema.
3. Atherosclerosis.

## 2015-04-19 MED ORDER — PROMETHAZINE-DM 6.25-15 MG/5ML PO SYRP: 5.0000 mL | ORAL_SOLUTION | Freq: Four times a day (QID) | ORAL | Status: DC | PRN

## 2015-04-19 MED ORDER — AZITHROMYCIN 250 MG PO TABS: ORAL_TABLET | ORAL | Status: AC

## 2015-04-19 MED ORDER — DEXAMETHASONE SODIUM PHOSPHATE 10 MG/ML IJ SOLN
10.0000 mg | Freq: Once | INTRAMUSCULAR | Status: AC
  Administered 2015-04-19: 10 mg via INTRAMUSCULAR
  Filled 2015-04-19: qty 1

## 2015-04-19 NOTE — ED Notes (Signed)
Pt reports cold sx's for the past 3-4 days. Pt reports coughed something up yesterday but none today. Pt denies fevers. Pt reports hx of COPD and wants to make sure she does not develop pneumonia in the future.

## 2015-04-19 NOTE — ED Provider Notes (Signed)
[facility] Emergency Department Provider Note  ____________________________________________  Time seen: Approximately 2:44 PM  I have reviewed the triage vital signs and the nursing notes.   HISTORY  Chief Complaint Cough and Nasal Congestion    HPI Krista Cisneros is a 74 y.o. female patient complaining of URI signs and symptoms for 4 days. Yesterday the patient had a productive cough but not today. Patient denies any fever or chills associated this complaint. Patient states she has a history of COPD that normally clears with a nebulized treatment. Patient patient had 2 nebulized treatments yesterday and one today which is not clearing up her complaint. Patient's concern for pneumonia. Patient currently uses 3 inhalers.  No past medical history on file.  There are no active problems to display for this patient.   No past surgical history on file.  Current Outpatient Rx  Name  Route  Sig  Dispense  Refill  . azithromycin (ZITHROMAX Z-PAK) 250 MG tablet      Take 2 tablets (500 mg) on  Day 1,  followed by 1 tablet (250 mg) once daily on Days 2 through 5.   6 each   0   . DULoxetine (CYMBALTA) 60 MG capsule   Oral   Take 1 capsule (60 mg total) by mouth daily.   30 capsule   0   . promethazine-dextromethorphan (PROMETHAZINE-DM) 6.25-15 MG/5ML syrup   Oral   Take 5 mLs by mouth 4 (four) times daily as needed for cough.   118 mL   0     Allergies Penicillins  No family history on file.  Social History Social History  Substance Use Topics  . Smoking status: Former Research scientist (life sciences)  . Smokeless tobacco: Not on file  . Alcohol Use: No    Review of Systems Constitutional: No fever/chills Eyes: No visual changes. ENT: No sore throat. Cardiovascular: Denies chest pain. Respiratory: Denies shortness of breath. Gastrointestinal: No abdominal pain.  No nausea, no vomiting.  No diarrhea.  No constipation. Genitourinary: Negative for  dysuria. Musculoskeletal: Negative for back pain. Skin: Negative for rash. Neurological: Negative for headaches, focal weakness or numbness. 10-point ROS otherwise negative.  ____________________________________________   PHYSICAL EXAM:  VITAL SIGNS: ED Triage Vitals  Enc Vitals Group     BP 04/19/15 1416 172/97 mmHg     Pulse Rate 04/19/15 1416 86     Resp 04/19/15 1416 20     Temp 04/19/15 1416 97.6 F (36.4 C)     Temp Source 04/19/15 1416 Oral     SpO2 04/19/15 1416 95 %     Weight 04/19/15 1416 180 lb (81.647 kg)     Height 04/19/15 1416 5\' 2"  (1.575 m)     Head Cir --      Peak Flow --      Pain Score --      Pain Loc --      Pain Edu? --      Excl. in [place]? --     Constitutional: Alert and oriented. Well appearing and in no acute distress. Eyes: Conjunctivae are normal. PERRL. EOMI. Head: Atraumatic. Nose: No congestion/rhinnorhea. Mouth/Throat: Mucous membranes are moist.  Oropharynx non-erythematous. Neck: No stridor. No cervical spine tenderness to palpation. Hematological/Lymphatic/Immunilogical: No cervical lymphadenopathy. Cardiovascular: Normal rate, regular rhythm. Grossly normal heart sounds.  Good peripheral circulation. Elevated blood pressure Respiratory: Normal respiratory effort.  No retractions. Lungs CTAB. Bilateral Rales Gastrointestinal: Soft and nontender. No distention. No abdominal bruits. No CVA tenderness. Musculoskeletal: No  lower extremity tenderness nor edema.  No joint effusions. Neurologic:  Normal speech and language. No gross focal neurologic deficits are appreciated. No gait instability. Skin:  Skin is warm, dry and intact. No rash noted. Psychiatric: Mood and affect are normal. Speech and behavior are normal.  ____________________________________________   LABS (all labs ordered are listed, but only abnormal results are displayed)  Labs Reviewed - No data to  display ____________________________________________  EKG   ____________________________________________  RADIOLOGY  Findings consistent with the same and COPD. I, Sable Feil, personally viewed and evaluated these images (plain radiographs) as part of my medical decision making, as well as reviewing the written report by the radiologist.  ____________________________________________   PROCEDURES  Procedure(s) performed: None  Critical Care performed: No  ____________________________________________   INITIAL IMPRESSION / ASSESSMENT AND PLAN / ED COURSE  Pertinent labs & imaging results that were available during my care of the patient were reviewed by me and considered in my medical decision making (see chart for details).  Lower respiratory infection. Chest x-ray findings with patient. Patient given a prescription for Zithromax and Phenergan DM. He is advised follow-up family doctor in one week if condition persists or return to ER if condition worsens. ____________________________________________   FINAL CLINICAL IMPRESSION(S) / ED DIAGNOSES  Final diagnoses:  Respiratory infection  Atelectasis of right lung  Emphysema, unspecified [facility])      Sable Feil, PA-C 04/19/15 1548  Lavonia Drafts, MD 04/20/15 [phone]

## 2015-05-04 ENCOUNTER — Other Ambulatory Visit: Payer: Self-pay | Admitting: Unknown Physician Specialty

## 2015-09-10 DIAGNOSIS — F332 Major depressive disorder, recurrent severe without psychotic features: Secondary | ICD-10-CM | POA: Insufficient documentation

## 2015-10-01 ENCOUNTER — Other Ambulatory Visit: Payer: Self-pay | Admitting: Unknown Physician Specialty

## 2015-10-06 ENCOUNTER — Telehealth: Payer: Self-pay

## 2015-10-06 NOTE — Telephone Encounter (Signed)
Tried calling patient because we got a refill request for patient's estradiol and the provider has already refused it stating that the patient needs appointment according to chart. So I tried calling the patient to schedule her an appointment but she did not answer so I left a voicemail asking for her to please return my call.

## 2015-10-07 NOTE — Telephone Encounter (Signed)
Called and left patient a voicemail asking for her to please return my call.  

## 2015-10-08 NOTE — Telephone Encounter (Signed)
Called and left patient a voicemail asking for her to please return my call. This was the 3rd attempt at reaching the patient so I will send her a letter.

## 2015-11-15 DIAGNOSIS — G629 Polyneuropathy, unspecified: Secondary | ICD-10-CM | POA: Insufficient documentation

## 2016-01-07 DIAGNOSIS — I700 Atherosclerosis of aorta: Secondary | ICD-10-CM | POA: Insufficient documentation

## 2016-01-07 DIAGNOSIS — R739 Hyperglycemia, unspecified: Secondary | ICD-10-CM | POA: Insufficient documentation

## 2016-04-24 DIAGNOSIS — Z9221 Personal history of antineoplastic chemotherapy: Secondary | ICD-10-CM

## 2016-04-24 HISTORY — DX: Personal history of antineoplastic chemotherapy: Z92.21

## 2016-05-29 ENCOUNTER — Ambulatory Visit (INDEPENDENT_AMBULATORY_CARE_PROVIDER_SITE_OTHER): Payer: Medicare HMO | Admitting: Vascular Surgery

## 2016-05-29 ENCOUNTER — Encounter (INDEPENDENT_AMBULATORY_CARE_PROVIDER_SITE_OTHER): Payer: Self-pay | Admitting: Vascular Surgery

## 2016-05-29 DIAGNOSIS — M79605 Pain in left leg: Secondary | ICD-10-CM

## 2016-05-29 DIAGNOSIS — I10 Essential (primary) hypertension: Secondary | ICD-10-CM | POA: Diagnosis not present

## 2016-05-29 DIAGNOSIS — M79609 Pain in unspecified limb: Secondary | ICD-10-CM | POA: Insufficient documentation

## 2016-05-29 DIAGNOSIS — M79604 Pain in right leg: Secondary | ICD-10-CM | POA: Diagnosis not present

## 2016-05-29 DIAGNOSIS — J449 Chronic obstructive pulmonary disease, unspecified: Secondary | ICD-10-CM | POA: Diagnosis not present

## 2016-05-29 NOTE — Progress Notes (Signed)
MRN : [id number]  Krista Cisneros is a 76 y.o. (1940/10/13) female who presents with chief complaint of  Chief Complaint  Patient presents with  . New Patient (Initial Visit)  .  History of Present Illness: The patient is seen for evaluation of painful lower extremities. Patient notes the pain is variable and not always associated with activity.  The pain is somewhat consistent day to day occurring on most days. The patient notes the pain also occurs with standing and routinely seems worse as the day wears on. The pain has been progressive over the past several years. The patient states these symptoms are causing  a profound negative impact on quality of life and daily activities.  The patient denies rest pain or dangling of an extremity off the side of the bed during the night for relief. No open wounds or sores at this time. No history of DVT or phlebitis. No prior interventions or surgeries.  There is a  history of back problems and DJD of the lumbar and sacral spine.   Current Meds  Medication Sig  . ADVAIR DISKUS 500-50 MCG/DOSE AEPB   . baclofen (LIORESAL) 10 MG tablet Take by mouth.  . DULoxetine (CYMBALTA) 60 MG capsule Take 1 capsule (60 mg total) by mouth daily.  Marland Kitchen losartan-hydrochlorothiazide (HYZAAR) 100-25 MG tablet   . montelukast (SINGULAIR) 10 MG tablet   . omeprazole (PRILOSEC) 20 MG capsule take 1 capsule by mouth once daily  . Potassium Chloride ER 20 MEQ TBCR   . tiotropium (SPIRIVA HANDIHALER) 18 MCG inhalation capsule inhale the contents of one capsule in the handihaler once daily    Past Medical History:  Diagnosis Date  . COPD (chronic obstructive pulmonary disease) ([place])   . Hypertension     Past Surgical History:  Procedure Laterality Date  . ABDOMINAL HYSTERECTOMY      Social History Social History  Substance Use Topics  . Smoking status: Former Research scientist (life sciences)  . Smokeless tobacco: Never Used  . Alcohol use No    Family History Family  History  Problem Relation Age of Onset  . Cancer Mother   No family history of bleeding/clotting disorders, porphyria or autoimmune disease   Allergies  Allergen Reactions  . Gabapentin Swelling  . Penicillins Swelling  . Atorvastatin Other (See Comments)     REVIEW OF SYSTEMS (Negative unless checked)  Constitutional: [] Weight loss  [] Fever  [] Chills Cardiac: [] Chest pain   [] Chest pressure   [] Palpitations   [] Shortness of breath when laying flat   [] Shortness of breath with exertion. Vascular:  [] Pain in legs with walking   [] Pain in legs at rest  [] History of DVT   [] Phlebitis   [x] Swelling in legs   [x] Varicose veins   [] Non-healing ulcers Pulmonary:   [] Uses home oxygen   [] Productive cough   [] Hemoptysis   [] Wheeze  [] COPD   [] Asthma Neurologic:  [] Dizziness   [] Seizures   [] History of stroke   [] History of TIA  [] Aphasia   [] Vissual changes   [] Weakness or numbness in arm   [] Weakness or numbness in leg Musculoskeletal:   [] Joint swelling   [] Joint pain   [] Low back pain Hematologic:  [] Easy bruising  [] Easy bleeding   [] Hypercoagulable state   [] Anemic Gastrointestinal:  [] Diarrhea   [] Vomiting  [] Gastroesophageal reflux/heartburn   [] Difficulty swallowing. Genitourinary:  [] Chronic kidney disease   [] Difficult urination  [] Frequent urination   [] Blood in urine Skin:  [] Rashes   [] Ulcers  Psychological:  [] History of  anxiety   []  History of major depression.  Physical Examination  Vitals:   05/29/16 1002  BP: (!) 174/90  Pulse: 82  Resp: 16  Weight: 183 lb 9.6 oz (83.3 kg)  Height: 5\' 2"  (1.575 m)   Body mass index is 33.58 kg/m. Gen: WD/WN, NAD Head: [place]/AT, No temporalis wasting.  Ear/Nose/Throat: Hearing grossly intact, nares w/o erythema or drainage, poor dentition Eyes: PER, EOMI, sclera nonicteric.  Neck: Supple, no masses.  No bruit or JVD.  Pulmonary:  Good air movement, clear to auscultation bilaterally, no use of accessory muscles.  Cardiac: RRR, normal  S1, S2, no Murmurs. Vascular: 3+ pitting edema with moderate venous stasis changes Vessel Right Left  Radial Palpable Palpable  Ulnar Palpable Palpable  Brachial Palpable Palpable  Carotid Palpable Palpable  Femoral Palpable Palpable  Popliteal Palpable Palpable  PT 1+ Palpable Trace Palpable  DP 2+ Palpable 2+Palpable  Gastrointestinal: soft, non-distended. No guarding/no peritoneal signs.  Musculoskeletal: M/S 5/5 throughout.  No deformity or atrophy.  Neurologic: CN 2-12 intact. Pain and light touch intact in extremities.  Symmetrical.  Speech is fluent. Motor exam as listed above. Psychiatric: Judgment intact, Mood & affect appropriate for pt's clinical situation. Dermatologic: No rashes or ulcers noted.  No changes consistent with cellulitis. Lymph : No Cervical lymphadenopathy, no lichenification or skin changes of chronic lymphedema.  CBC Lab Results  Component Value Date   WBC 13.7 (H) 04/08/2014   HGB 13.5 04/08/2014   HCT 42.6 04/08/2014   MCV 89 04/08/2014   PLT 267 04/08/2014    BMET    Component Value Date/Time   NA 141 04/08/2014 1422   K 3.9 04/08/2014 1422   CL 107 04/08/2014 1422   CO2 28 04/08/2014 1422   GLUCOSE 95 04/08/2014 1422   BUN 11 04/08/2014 1422   CREATININE 1.09 04/08/2014 1422   CALCIUM 8.8 04/08/2014 1422   GFRNONAA 52 (L) 04/08/2014 1422   GFRAA >60 04/08/2014 1422   CrCl cannot be calculated (Patient's most recent lab result is older than the maximum 21 days allowed.).  COAG No results found for: INR, PROTIME  Radiology No results found.   Assessment/Plan 1. Pain in both lower extremities  Recommend:  The patient has atypical pain symptoms for pure atherosclerotic disease. 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  Noninvasive studies including ABI's and aorta iliac ultrasound of the legs will be  obtained and the patient will follow up with me to review these studies.  The patient should continue walking and begin a more formal exercise program. The patient should continue his antiplatelet therapy and aggressive treatment of the lipid abnormalities.  The patient should begin wearing graduated compression socks 15-20 mmHg strength to control edema.   2. Essential hypertension Continue antihypertensive medications as already ordered, these medications have been reviewed and there are no changes at this time.   3. Chronic obstructive pulmonary disease, unspecified COPD type ([place]) Continue pulmonary medications and aerosols as already ordered, these medications have been reviewed and there are no changes at this time.   Hortencia Pilar, MD  05/29/2016 10:12 AM

## 2016-07-24 ENCOUNTER — Encounter (INDEPENDENT_AMBULATORY_CARE_PROVIDER_SITE_OTHER): Payer: Self-pay | Admitting: Vascular Surgery

## 2016-07-24 ENCOUNTER — Ambulatory Visit (INDEPENDENT_AMBULATORY_CARE_PROVIDER_SITE_OTHER): Payer: Medicare HMO

## 2016-07-24 ENCOUNTER — Encounter (INDEPENDENT_AMBULATORY_CARE_PROVIDER_SITE_OTHER): Payer: Self-pay

## 2016-07-24 ENCOUNTER — Ambulatory Visit (INDEPENDENT_AMBULATORY_CARE_PROVIDER_SITE_OTHER): Payer: Medicare HMO | Admitting: Vascular Surgery

## 2016-07-24 VITALS — BP 181/85 | HR 79 | Resp 16 | Wt 182.8 lb

## 2016-07-24 DIAGNOSIS — M79604 Pain in right leg: Secondary | ICD-10-CM | POA: Diagnosis not present

## 2016-07-24 DIAGNOSIS — M79605 Pain in left leg: Secondary | ICD-10-CM

## 2016-07-24 DIAGNOSIS — I10 Essential (primary) hypertension: Secondary | ICD-10-CM | POA: Diagnosis not present

## 2016-07-24 DIAGNOSIS — J449 Chronic obstructive pulmonary disease, unspecified: Secondary | ICD-10-CM | POA: Diagnosis not present

## 2016-07-24 NOTE — Progress Notes (Signed)
Subjective:    Patient ID: Krista Cisneros, female    DOB: 1940/09/30, 76 y.o.   MRN: [id number] Chief Complaint  Patient presents with  . Follow-up   Patient presents to review vascular studies. She was last seen in 05/2016 for lower extremity pain. She is being worked up for arterial vs venous disease as a contributing factor to her pain. Her symptoms are stable. Denies any rest pain or ulceration to her lower extremity. Denies any fever, nausea or vomiting. The patient underwent an ABI which showed no signs of arterial disease.    Review of Systems  Constitutional: Negative.   HENT: Negative.   Eyes: Negative.   Respiratory: Negative.   Cardiovascular: Positive for leg swelling.       Bilateral Lower Extremity Pain  Gastrointestinal: Negative.   Endocrine: Negative.   Genitourinary: Negative.   Musculoskeletal: Negative.   Skin: Negative.   Allergic/Immunologic: Negative.   Neurological: Negative.   Hematological: Negative.   Psychiatric/Behavioral: Negative.       Objective:   Physical Exam  Constitutional: She is oriented to person, place, and time. She appears well-developed and well-nourished.  HENT:  Head: Normocephalic and atraumatic.  Eyes: Conjunctivae are normal. Pupils are equal, round, and reactive to light.  Neck: Normal range of motion.  Cardiovascular: Normal rate, regular rhythm, normal heart sounds and intact distal pulses.   Pulses:      Radial pulses are 2+ on the right side, and 2+ on the left side.  Hard to appreciate pedal pulses due to body habitus and edema.  Pulmonary/Chest: Effort normal.  Musculoskeletal: Normal range of motion. She exhibits edema (Moderate Bilateral 1+ pitting edema).  Neurological: She is alert and oriented to person, place, and time.  Skin: Skin is warm and dry.  Psychiatric: She has a normal mood and affect. Her behavior is normal. Judgment and thought content normal.   BP (!) 181/85   Pulse 79   Resp 16   Wt 182  lb 12.8 oz (82.9 kg)   BMI 33.43 kg/m   Past Medical History:  Diagnosis Date  . COPD (chronic obstructive pulmonary disease) ([place])   . Hypertension    Social History   Social History  . Marital status: Widowed    Spouse name: N/A  . Number of children: N/A  . Years of education: N/A   Occupational History  . Not on file.   Social History Main Topics  . Smoking status: Former Research scientist (life sciences)  . Smokeless tobacco: Never Used  . Alcohol use No  . Drug use: No  . Sexual activity: Not on file   Other Topics Concern  . Not on file   Social History Narrative  . No narrative on file   Past Surgical History:  Procedure Laterality Date  . ABDOMINAL HYSTERECTOMY     Family History  Problem Relation Age of Onset  . Cancer Mother    Allergies  Allergen Reactions  . Gabapentin Swelling  . Penicillins Swelling  . Atorvastatin Other (See Comments)      Assessment & Plan:  Patient presents to review vascular studies. She was last seen in 05/2016 for lower extremity pain. She is being worked up for arterial vs venous disease as a contributing factor to her pain. Her symptoms are stable. Denies any rest pain or ulceration to her lower extremity. Denies any fever, nausea or vomiting. The patient underwent an ABI which showed no signs of arterial disease.   1. Pain in both  lower extremities - Stable ABI with no significant arterial disease.  Arterial insufficieny ruled out. Will bring patient back for venous reflux to rule out venous vs lymphatic disease.  The patient was encouraged to wear graduated compression stockings (20-30 mmHg) on a daily basis. The patient was instructed to begin wearing the stockings first thing in the morning and removing them in the evening. The patient was instructed specifically not to sleep in the stockings. Prescription given. In addition, behavioral modification including elevation during the day will be initiated.  - VAS [place] LOWER EXTREMITY VENOUS REFLUX;  Future  2. Chronic obstructive pulmonary disease, unspecified COPD type ([place]) - Stable Followed by PCP. :Lungs clear on PE today.   3. Essential hypertension - Stable Encouraged good control as its slows the progression of atherosclerotic disease  Current Outpatient Prescriptions on File Prior to Visit  Medication Sig Dispense Refill  . ADVAIR DISKUS 500-50 MCG/DOSE AEPB     . baclofen (LIORESAL) 10 MG tablet Take by mouth.    . DULoxetine (CYMBALTA) 60 MG capsule Take 1 capsule (60 mg total) by mouth daily. 30 capsule 0  . estradiol (ESTRACE) 1 MG tablet take 1 tablet by mouth once daily 30 tablet 4  . losartan-hydrochlorothiazide (HYZAAR) 100-25 MG tablet     . montelukast (SINGULAIR) 10 MG tablet     . omeprazole (PRILOSEC) 20 MG capsule take 1 capsule by mouth once daily    . Potassium Chloride ER 20 MEQ TBCR     . promethazine-dextromethorphan (PROMETHAZINE-DM) 6.25-15 MG/5ML syrup Take 5 mLs by mouth 4 (four) times daily as needed for cough. (Patient not taking: Reported on 05/29/2016) 118 mL 0  . tiotropium (SPIRIVA HANDIHALER) 18 MCG inhalation capsule inhale the contents of one capsule in the handihaler once daily     No current facility-administered medications on file prior to visit.     There are no Patient Instructions on file for this visit. No Follow-up on file.   Kortney Schoenfelder A Rahman Ferrall, PA-C

## 2016-08-18 ENCOUNTER — Other Ambulatory Visit: Payer: Self-pay | Admitting: Family Medicine

## 2016-08-18 DIAGNOSIS — Z1231 Encounter for screening mammogram for malignant neoplasm of breast: Secondary | ICD-10-CM

## 2016-09-11 ENCOUNTER — Ambulatory Visit
Admission: RE | Admit: 2016-09-11 | Discharge: 2016-09-11 | Disposition: A | Discharge status: Home / Self Care | Payer: Medicare HMO | Source: Ambulatory Visit | Attending: Family Medicine | Admitting: Family Medicine

## 2016-09-11 DIAGNOSIS — Z1231 Encounter for screening mammogram for malignant neoplasm of breast: Secondary | ICD-10-CM | POA: Insufficient documentation

## 2016-09-11 IMAGING — MG MM DIGITAL SCREENING BILAT W/ TOMO W/ CAD
8 of 16 series · 8 of 32 positions shown · non-contrast
Comparison: Previous exam(s).

CLINICAL DATA: Screening.

EXAM:
2D DIGITAL SCREENING BILATERAL MAMMOGRAM WITH CAD AND ADJUNCT TOMO

[L MLO (1 of 2)]
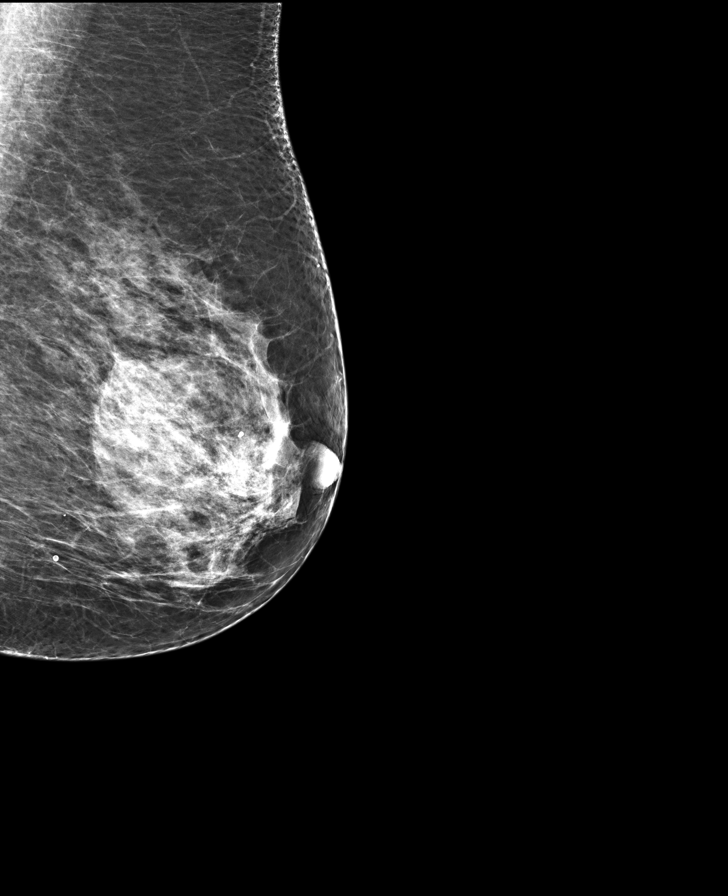

[R CC]
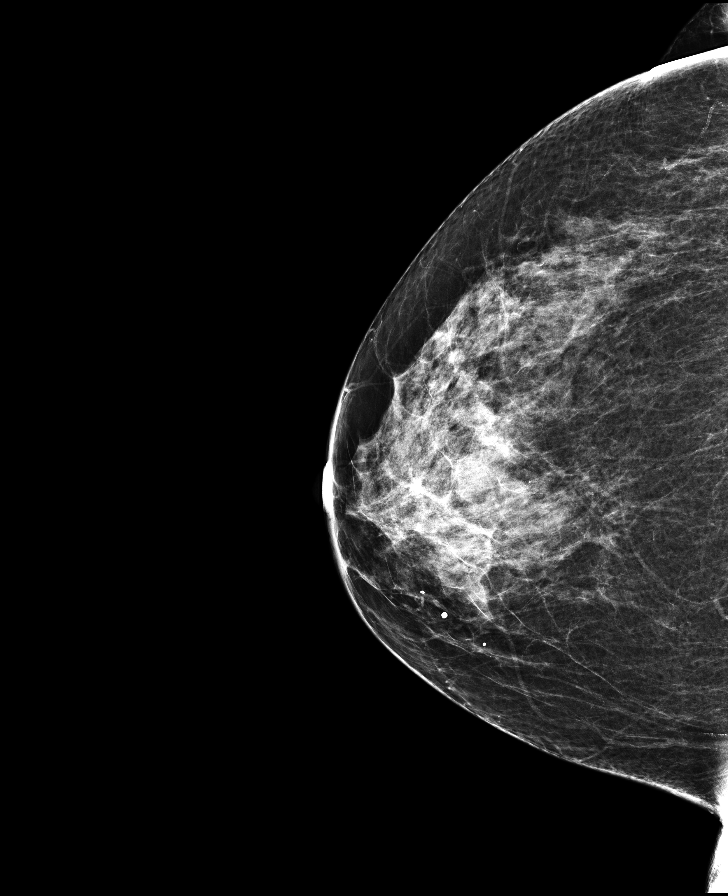

[R MLO]
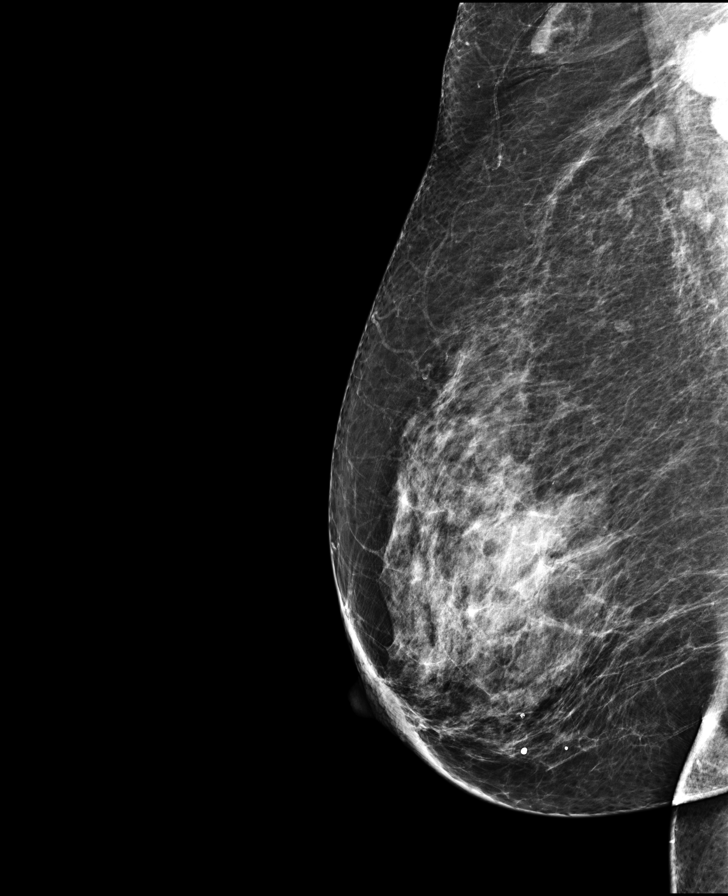

[R MLO synth-2D]
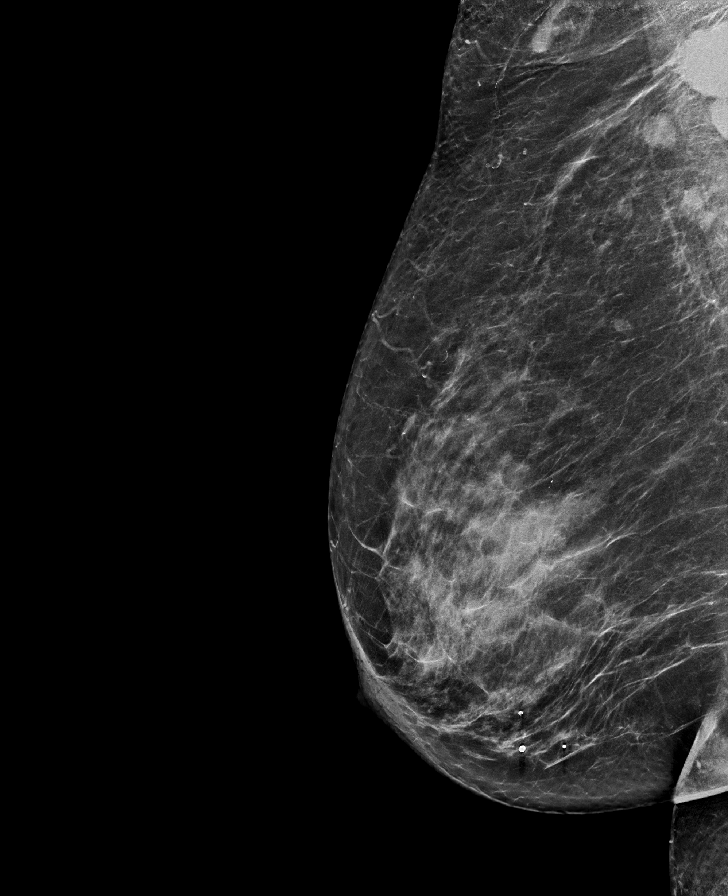

[L CC]
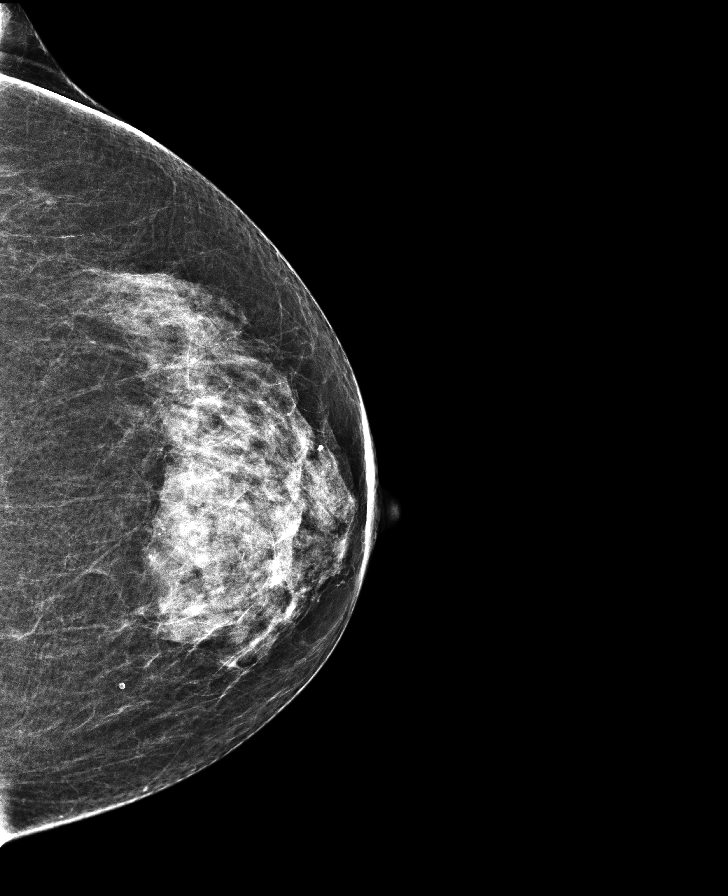

[L MLO (2 of 2)]
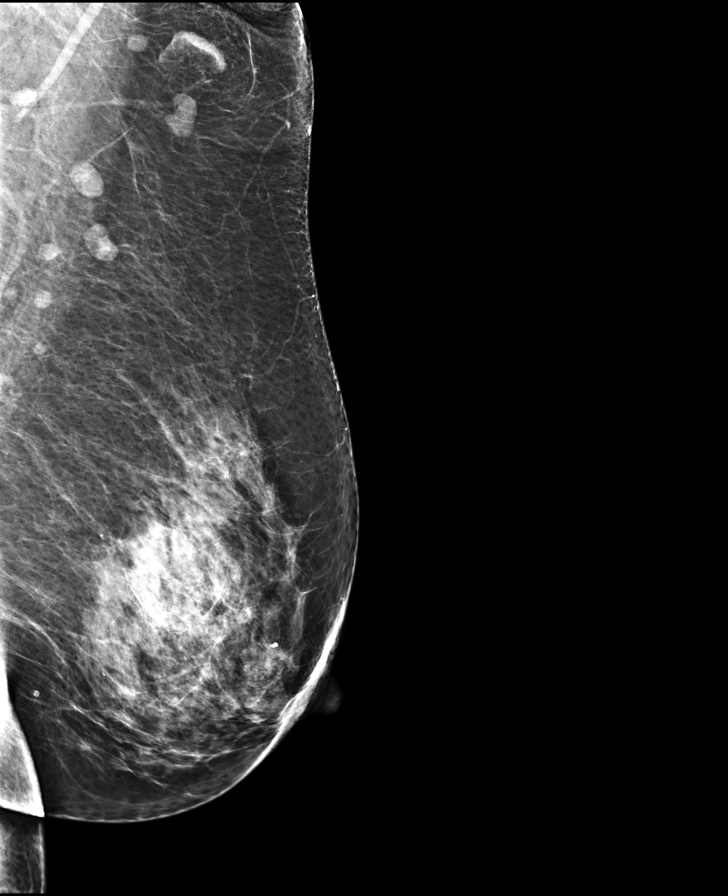

[L MLO synth-2D]
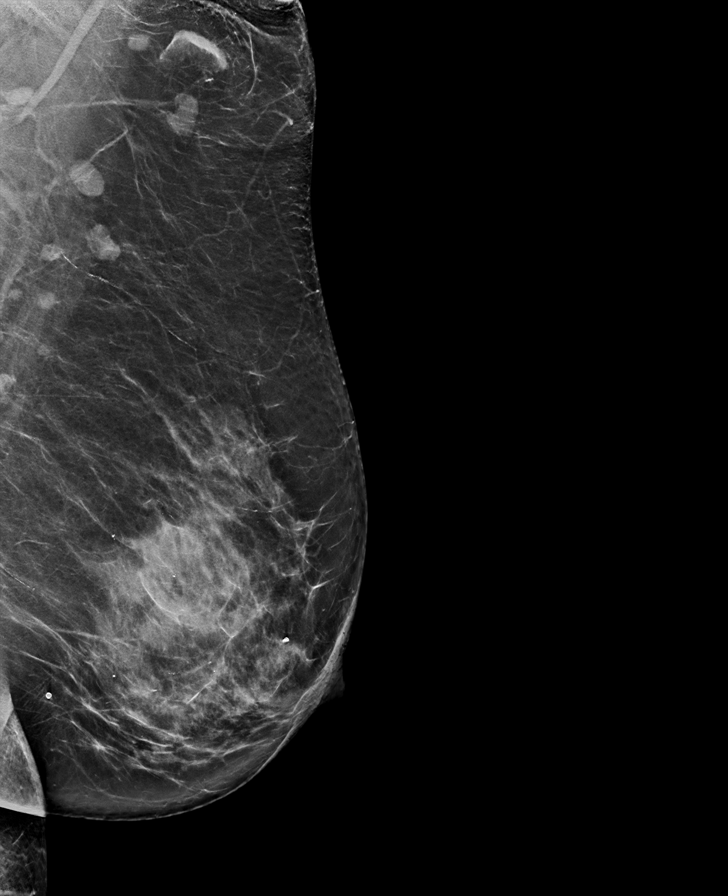

[L CC synth-2D]
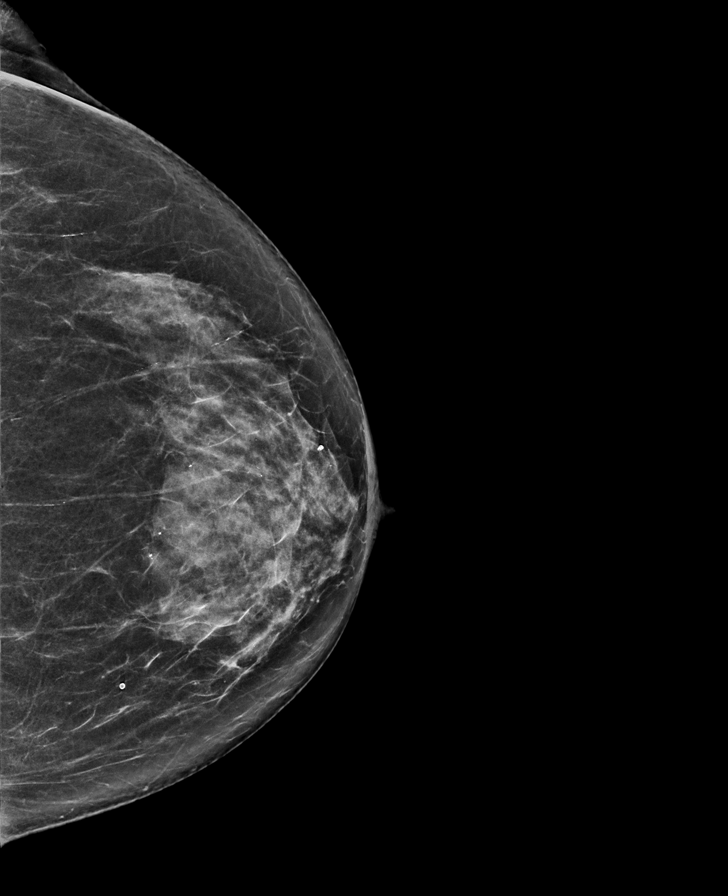

[8 of 32 positions shown; findings below may reference images not displayed]

ACR Breast Density Category c: The breast tissue is heterogeneously
dense, which may obscure small masses.
FINDINGS: In the right breast, possible asymmetries warrant further
evaluation. In the left breast, no findings suspicious for
malignancy. Images were processed with CAD.
IMPRESSION: Further evaluation is suggested for possible asymmetries in the
right breast.

RECOMMENDATION:
Diagnostic mammogram and possibly ultrasound of the right breast.
(Code:[57])

The patient will be contacted regarding the findings, and additional
imaging will be scheduled.

BI-RADS CATEGORY  0: Incomplete. Need additional imaging evaluation
and/or prior mammograms for comparison.

## 2016-09-13 ENCOUNTER — Other Ambulatory Visit: Payer: Self-pay | Admitting: *Deleted

## 2016-09-13 ENCOUNTER — Inpatient Hospital Stay
Admission: RE | Admit: 2016-09-13 | Discharge: 2016-09-13 | Disposition: A | Discharge status: Home / Self Care | Payer: Self-pay | Source: Ambulatory Visit | Attending: *Deleted | Admitting: *Deleted

## 2016-09-13 DIAGNOSIS — Z9289 Personal history of other medical treatment: Secondary | ICD-10-CM

## 2016-09-14 ENCOUNTER — Ambulatory Visit (INDEPENDENT_AMBULATORY_CARE_PROVIDER_SITE_OTHER): Payer: Medicare HMO | Admitting: Vascular Surgery

## 2016-09-14 ENCOUNTER — Encounter (INDEPENDENT_AMBULATORY_CARE_PROVIDER_SITE_OTHER): Payer: Medicare HMO

## 2016-09-14 DIAGNOSIS — I872 Venous insufficiency (chronic) (peripheral): Secondary | ICD-10-CM | POA: Insufficient documentation

## 2016-09-14 DIAGNOSIS — K219 Gastro-esophageal reflux disease without esophagitis: Secondary | ICD-10-CM | POA: Insufficient documentation

## 2016-09-14 NOTE — Progress Notes (Deleted)
MRN : [id number]  Krista Cisneros is a 76 y.o. (10/15/1940) female who presents with chief complaint of No chief complaint on file. Marland Kitchen  History of Present Illness: The patient returns to the office for followup evaluation regarding leg swelling.  The swelling has persisted and the pain associated with swelling continues. There have not been any interval development of a ulcerations or wounds.  Since the previous visit the patient has been wearing graduated compression stockings and has noted little if any improvement in the lymphedema. The patient has been using compression routinely morning until night.  The patient also states elevation during the day and exercise is being done too.   No outpatient prescriptions have been marked as taking for the 09/14/16 encounter (Appointment) with Delana Meyer, Dolores Lory, MD.    Past Medical History:  Diagnosis Date  . COPD (chronic obstructive pulmonary disease) ([place])   . Hypertension     Past Surgical History:  Procedure Laterality Date  . ABDOMINAL HYSTERECTOMY      Social History Social History  Substance Use Topics  . Smoking status: Former Research scientist (life sciences)  . Smokeless tobacco: Never Used  . Alcohol use No    Family History Family History  Problem Relation Age of Onset  . Cancer Mother     Allergies  Allergen Reactions  . Gabapentin Swelling  . Penicillins Swelling  . Atorvastatin Other (See Comments)     REVIEW OF SYSTEMS (Negative unless checked)  Constitutional: [] Weight loss  [] Fever  [] Chills Cardiac: [] Chest pain   [] Chest pressure   [] Palpitations   [] Shortness of breath when laying flat   [] Shortness of breath with exertion. Vascular:  [] Pain in legs with walking   [] Pain in legs at rest  [] History of DVT   [] Phlebitis   [] Swelling in legs   [] Varicose veins   [] Non-healing ulcers Pulmonary:   [] Uses home oxygen   [] Productive cough   [] Hemoptysis   [] Wheeze  [] COPD   [] Asthma Neurologic:  [] Dizziness   [] Seizures    [] History of stroke   [] History of TIA  [] Aphasia   [] Vissual changes   [] Weakness or numbness in arm   [] Weakness or numbness in leg Musculoskeletal:   [] Joint swelling   [] Joint pain   [] Low back pain Hematologic:  [] Easy bruising  [] Easy bleeding   [] Hypercoagulable state   [] Anemic Gastrointestinal:  [] Diarrhea   [] Vomiting  [] Gastroesophageal reflux/heartburn   [] Difficulty swallowing. Genitourinary:  [] Chronic kidney disease   [] Difficult urination  [] Frequent urination   [] Blood in urine Skin:  [] Rashes   [] Ulcers  Psychological:  [] History of anxiety   []  History of major depression.  Physical Examination  There were no vitals filed for this visit. There is no height or weight on file to calculate BMI. Gen: WD/WN, NAD Head: [place]/AT, No temporalis wasting.  Ear/Nose/Throat: Hearing grossly intact, nares w/o erythema or drainage Eyes: PER, EOMI, sclera nonicteric.  Neck: Supple, no large masses.   Pulmonary:  Good air movement, no audible wheezing bilaterally, no use of accessory muscles.  Cardiac: RRR, no JVD Vascular: *** Vessel Right Left  Radial Palpable Palpable  Ulnar Palpable Palpable  Brachial Palpable Palpable  Carotid Palpable Palpable  Femoral Palpable Palpable  Popliteal Palpable Palpable  PT Palpable Palpable  DP Palpable Palpable  Gastrointestinal: Non-distended. No guarding/no peritoneal signs.  Musculoskeletal: M/S 5/5 throughout.  No deformity or atrophy.  Neurologic: CN 2-12 intact. Symmetrical.  Speech is fluent. Motor exam as listed above. Psychiatric: Judgment intact, Mood &  affect appropriate for pt's clinical situation. Dermatologic: No rashes or ulcers noted.  No changes consistent with cellulitis. Lymph : No lichenification or skin changes of chronic lymphedema.  CBC Lab Results  Component Value Date   WBC 13.7 (H) 04/08/2014   HGB 13.5 04/08/2014   HCT 42.6 04/08/2014   MCV 89 04/08/2014   PLT 267 04/08/2014    BMET    Component Value  Date/Time   NA 141 04/08/2014 1422   K 3.9 04/08/2014 1422   CL 107 04/08/2014 1422   CO2 28 04/08/2014 1422   GLUCOSE 95 04/08/2014 1422   BUN 11 04/08/2014 1422   CREATININE 1.09 04/08/2014 1422   CALCIUM 8.8 04/08/2014 1422   GFRNONAA 52 (L) 04/08/2014 1422   GFRAA >60 04/08/2014 1422   CrCl cannot be calculated (Patient's most recent lab result is older than the maximum 21 days allowed.).  COAG No results found for: INR, PROTIME  Radiology Mm Screening Breast Tomo Bilateral  Result Date: 09/14/2016 CLINICAL DATA:  Screening. EXAM: 2D DIGITAL SCREENING BILATERAL MAMMOGRAM WITH CAD AND ADJUNCT TOMO COMPARISON:  Previous exam(s). ACR Breast Density Category c: The breast tissue is heterogeneously dense, which may obscure small masses. FINDINGS: In the right breast, possible asymmetries warrant further evaluation. In the left breast, no findings suspicious for malignancy. Images were processed with CAD. IMPRESSION: Further evaluation is suggested for possible asymmetries in the right breast. RECOMMENDATION: Diagnostic mammogram and possibly ultrasound of the right breast. (Code:FI-R-20M) The patient will be contacted regarding the findings, and additional imaging will be scheduled. BI-RADS CATEGORY  0: Incomplete. Need additional imaging evaluation and/or prior mammograms for comparison. Electronically Signed   By: Pamelia Hoit M.D.   On: 09/14/2016 06:28   Mm Outside Films Mammo  Result Date: 09/13/2016 This examination belongs to an outside facility and is stored here for comparison purposes only.  Contact the originating outside institution for any associated report or interpretation.   Outside Studies/Documentation *** pages of outside documents were reviewed.  They showed ***.  Assessment/Plan 1. Chronic venous insufficiency ***  2. Pain in both lower extremities ***  3. Essential hypertension ***  4. Chronic obstructive pulmonary disease, unspecified COPD type  ([facility]) ***  5. Gastroesophageal reflux disease without esophagitis ***    Hortencia Pilar, MD  09/14/2016 12:56 PM

## 2016-09-20 ENCOUNTER — Other Ambulatory Visit: Payer: Self-pay | Admitting: Family Medicine

## 2016-09-20 DIAGNOSIS — R928 Other abnormal and inconclusive findings on diagnostic imaging of breast: Secondary | ICD-10-CM

## 2016-09-20 DIAGNOSIS — N6489 Other specified disorders of breast: Secondary | ICD-10-CM

## 2016-09-28 ENCOUNTER — Ambulatory Visit
Admission: RE | Admit: 2016-09-28 | Discharge: 2016-09-28 | Disposition: A | Discharge status: Home / Self Care | Payer: Medicare HMO | Source: Ambulatory Visit | Attending: Family Medicine | Admitting: Family Medicine

## 2016-09-28 DIAGNOSIS — R590 Localized enlarged lymph nodes: Secondary | ICD-10-CM | POA: Insufficient documentation

## 2016-09-28 DIAGNOSIS — N6489 Other specified disorders of breast: Secondary | ICD-10-CM | POA: Diagnosis present

## 2016-09-28 DIAGNOSIS — N6331 Unspecified lump in axillary tail of the right breast: Secondary | ICD-10-CM | POA: Insufficient documentation

## 2016-09-28 DIAGNOSIS — R928 Other abnormal and inconclusive findings on diagnostic imaging of breast: Secondary | ICD-10-CM

## 2016-09-28 IMAGING — MG MM DIGITAL DIAGNOSTIC UNILAT*R* W/ TOMO W/ CAD
6 of 9 series · 6 of 21 positions shown · non-contrast
Comparison: Previous exam(s).

CLINICAL DATA: Screening recall for right breast asymmetries.

EXAM:
2D DIGITAL DIAGNOSTIC UNILATERAL RIGHT MAMMOGRAM WITH CAD AND
ADJUNCT TOMO
RIGHT BREAST ULTRASOUND

[R MLO synth-2D]
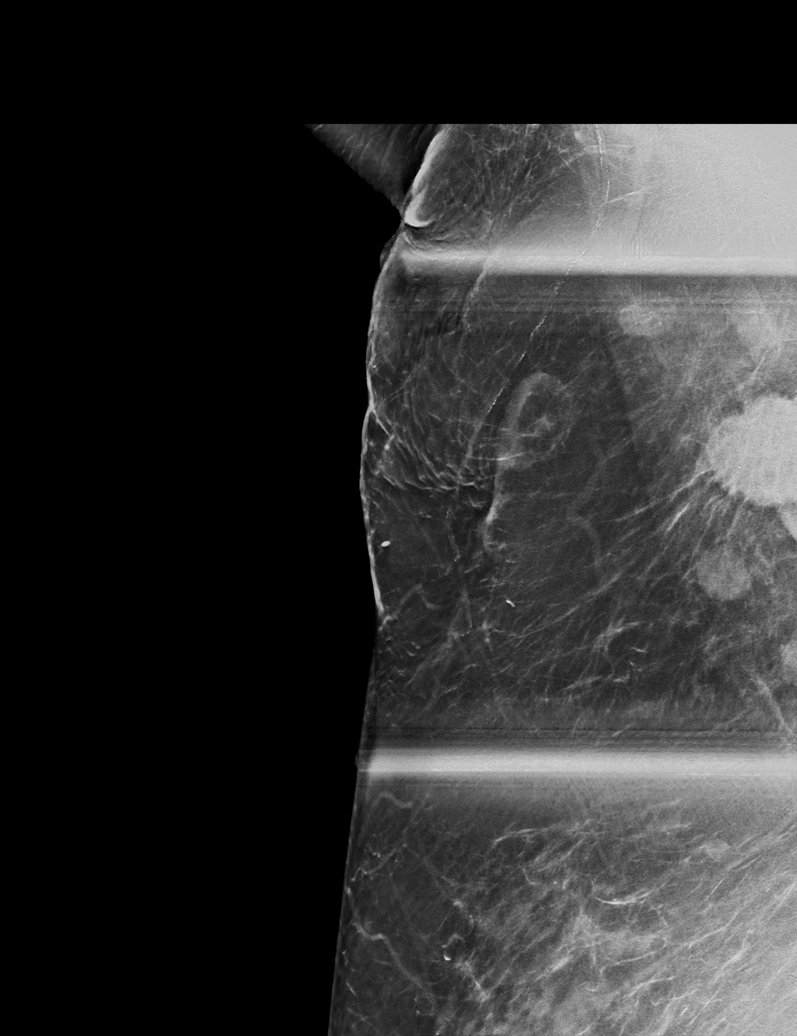

[R CC]
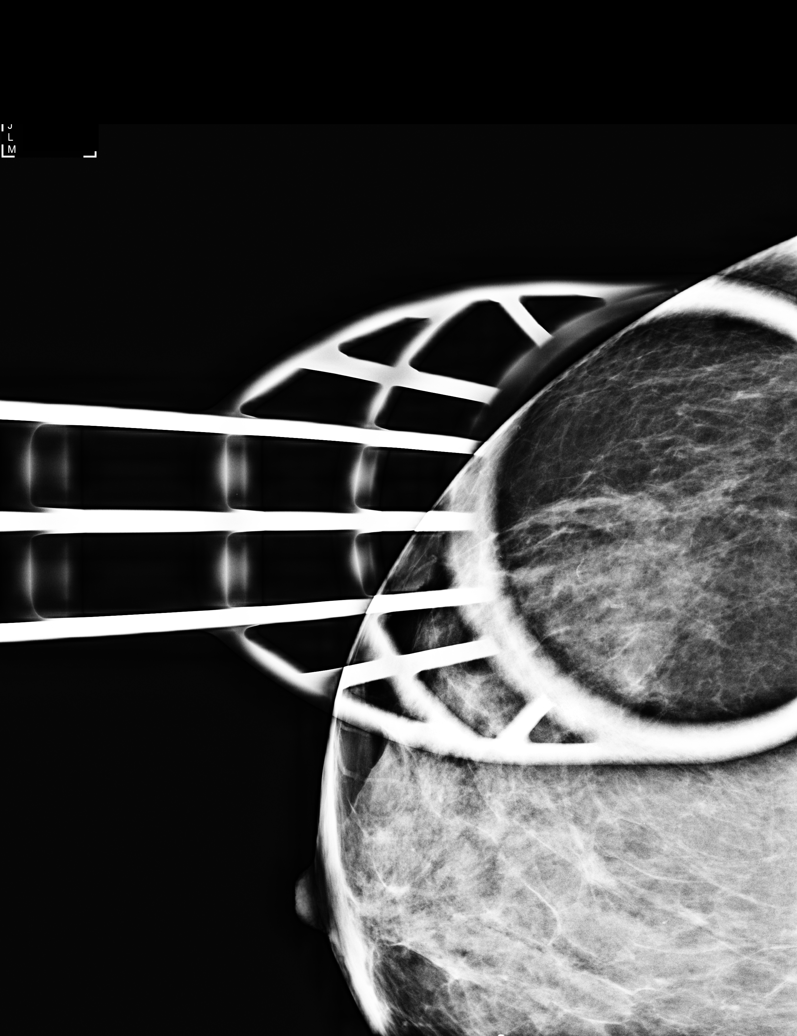

[R XCCL]
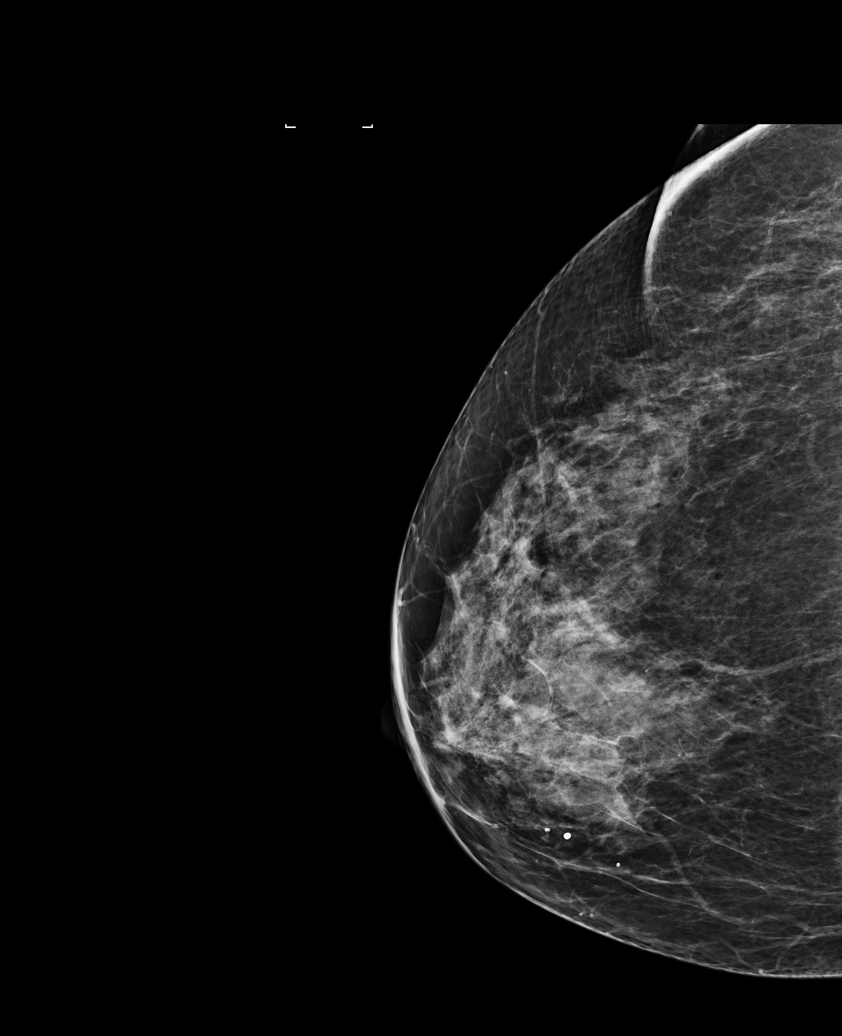

[R CC synth-2D]
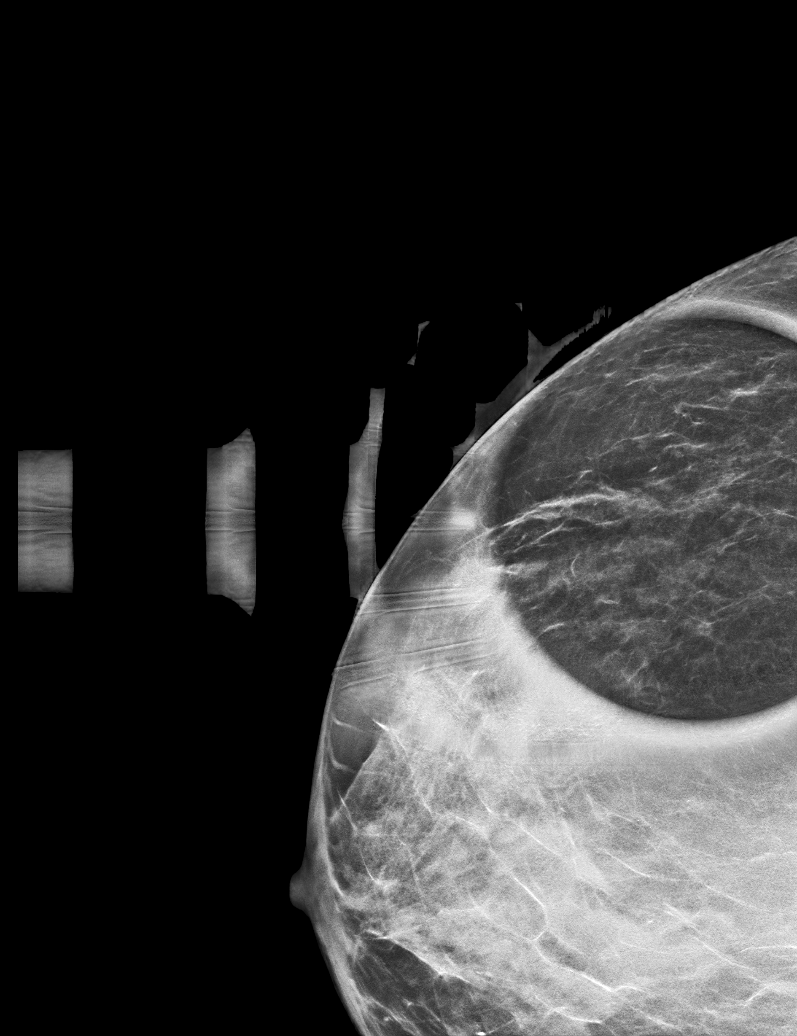

[R MLO]
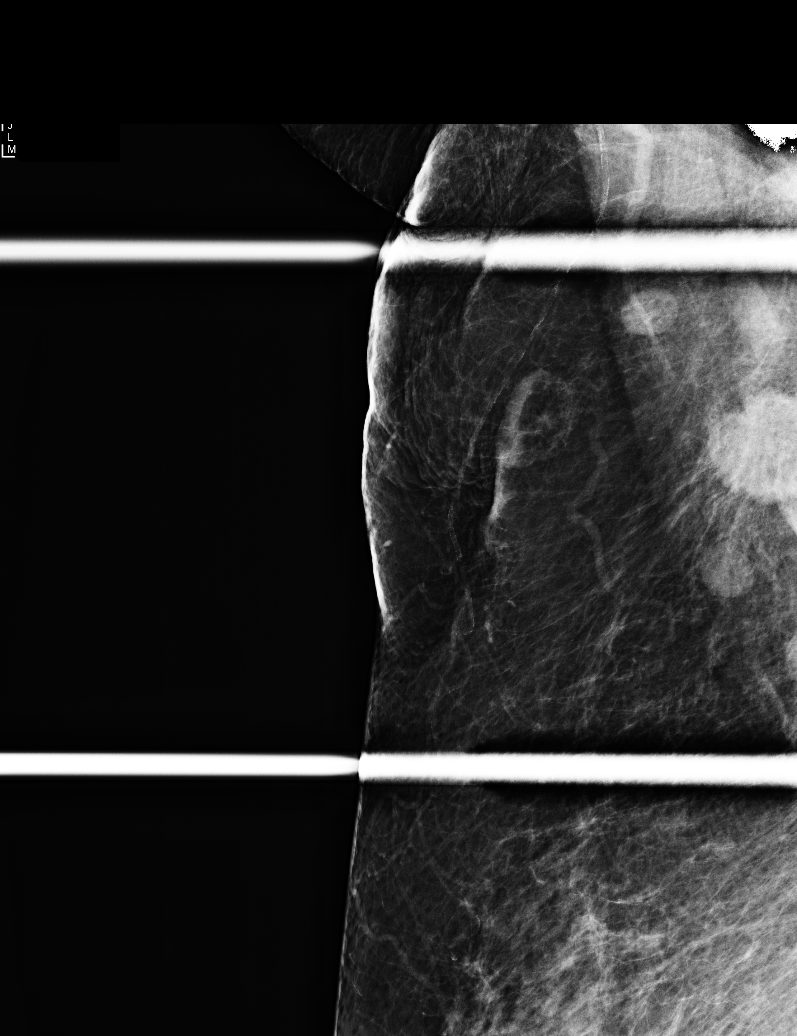

[R XCCL synth-2D]
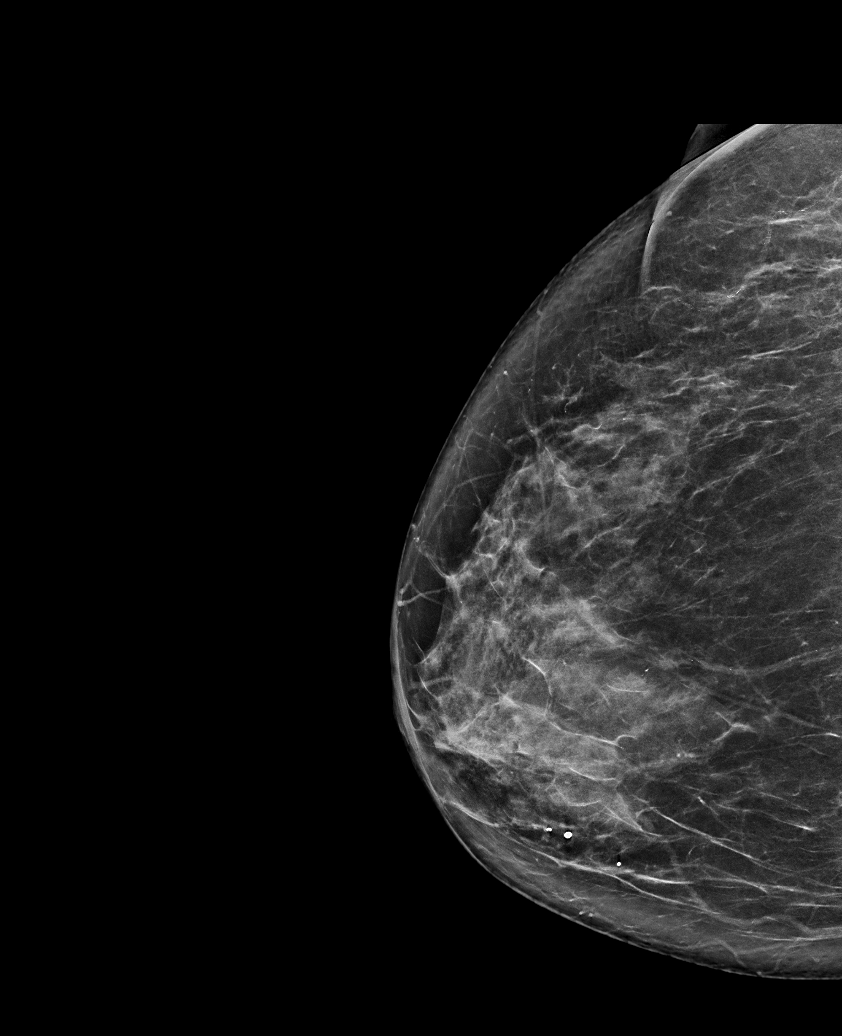

[6 of 21 positions shown; findings below may reference images not displayed]

ACR Breast Density Category c: The breast tissue is heterogeneously
dense, which may obscure small masses.
FINDINGS: Spot compression CC and MLO tomograms as well as XCCL tomograms were
performed of the right breast. The initially questioned asymmetry
seen in the outer right breast on the CC view only appears to
resolve on the additional imaging with findings suggestive of an
area of overlapping fibroglandular tissue. There is a spiculated
mass partially visualized in the right axilla with several adjacent
enlarged axillary lymph nodes, with the large mass measuring
approximately 2.4 cm.

Mammographic images were processed with CAD.

Physical examination of the right axillary tail reveals extreme
tenderness with palpation.

Targeted ultrasound of the outer right breast as well as right
axilla was performed. No definite abnormality seen sonographically
in the outer right breast.

There is an irregular hypoechoic mass in the right axillary tail
measuring 2.2 x 2.3 by 2.2 cm. This mass corresponds well with the
mass seen at mammography. At least 3 adjacent enlarged right
axillary lymph nodes are identified, the larger of which has a
cortex thickness of 0.9 cm.
IMPRESSION: 1. Highly suspicious spiculated mass in the right axillary tail,
with findings concerning for breast carcinoma versus a large nodal
metastasis with extra capsular extension.

2.  Several adjacent abnormal axillary lymph nodes.

RECOMMENDATION:
1. Recommend ultrasound-guided biopsy of the spiculated mass in the
right axillary tail.

2. Recommend ultrasound-guided biopsy of an abnormal lymph node in
the right axilla.

3. Recommend breast MRI to evaluate for primary site of malignancy
if pathology from the right axillary tail mass does not demonstrate
any lymphoid tissue.

I have discussed the findings and recommendations with the patient.
Results were also provided in writing at the conclusion of the
visit. If applicable, a reminder letter will be sent to the patient
regarding the next appointment.

BI-RADS CATEGORY  5: Highly suggestive of malignancy.

## 2016-09-29 ENCOUNTER — Other Ambulatory Visit: Payer: Self-pay | Admitting: Family Medicine

## 2016-09-29 DIAGNOSIS — R2231 Localized swelling, mass and lump, right upper limb: Secondary | ICD-10-CM

## 2016-09-29 DIAGNOSIS — R928 Other abnormal and inconclusive findings on diagnostic imaging of breast: Secondary | ICD-10-CM

## 2016-10-04 ENCOUNTER — Ambulatory Visit
Admission: RE | Admit: 2016-10-04 | Discharge: 2016-10-04 | Disposition: A | Discharge status: Home / Self Care | Payer: Medicare HMO | Source: Ambulatory Visit | Attending: Family Medicine | Admitting: Family Medicine

## 2016-10-04 DIAGNOSIS — C50611 Malignant neoplasm of axillary tail of right female breast: Secondary | ICD-10-CM | POA: Insufficient documentation

## 2016-10-04 DIAGNOSIS — R2231 Localized swelling, mass and lump, right upper limb: Secondary | ICD-10-CM

## 2016-10-04 DIAGNOSIS — R928 Other abnormal and inconclusive findings on diagnostic imaging of breast: Secondary | ICD-10-CM

## 2016-10-04 HISTORY — PX: BREAST BIOPSY: SHX20

## 2016-10-04 IMAGING — MG MM BREAST LOCALIZATION CLIP
1 series · 1 of 1 positions shown · non-contrast
Comparison: Previous exam(s).

CLINICAL DATA: 76-year-old female status post ultrasound-guided
biopsies of a right axillary tail mass and an enlarged right
axillary lymph node

EXAM:
DIAGNOSTIC RIGHT MAMMOGRAM POST ULTRASOUND BIOPSY

[R MLO]
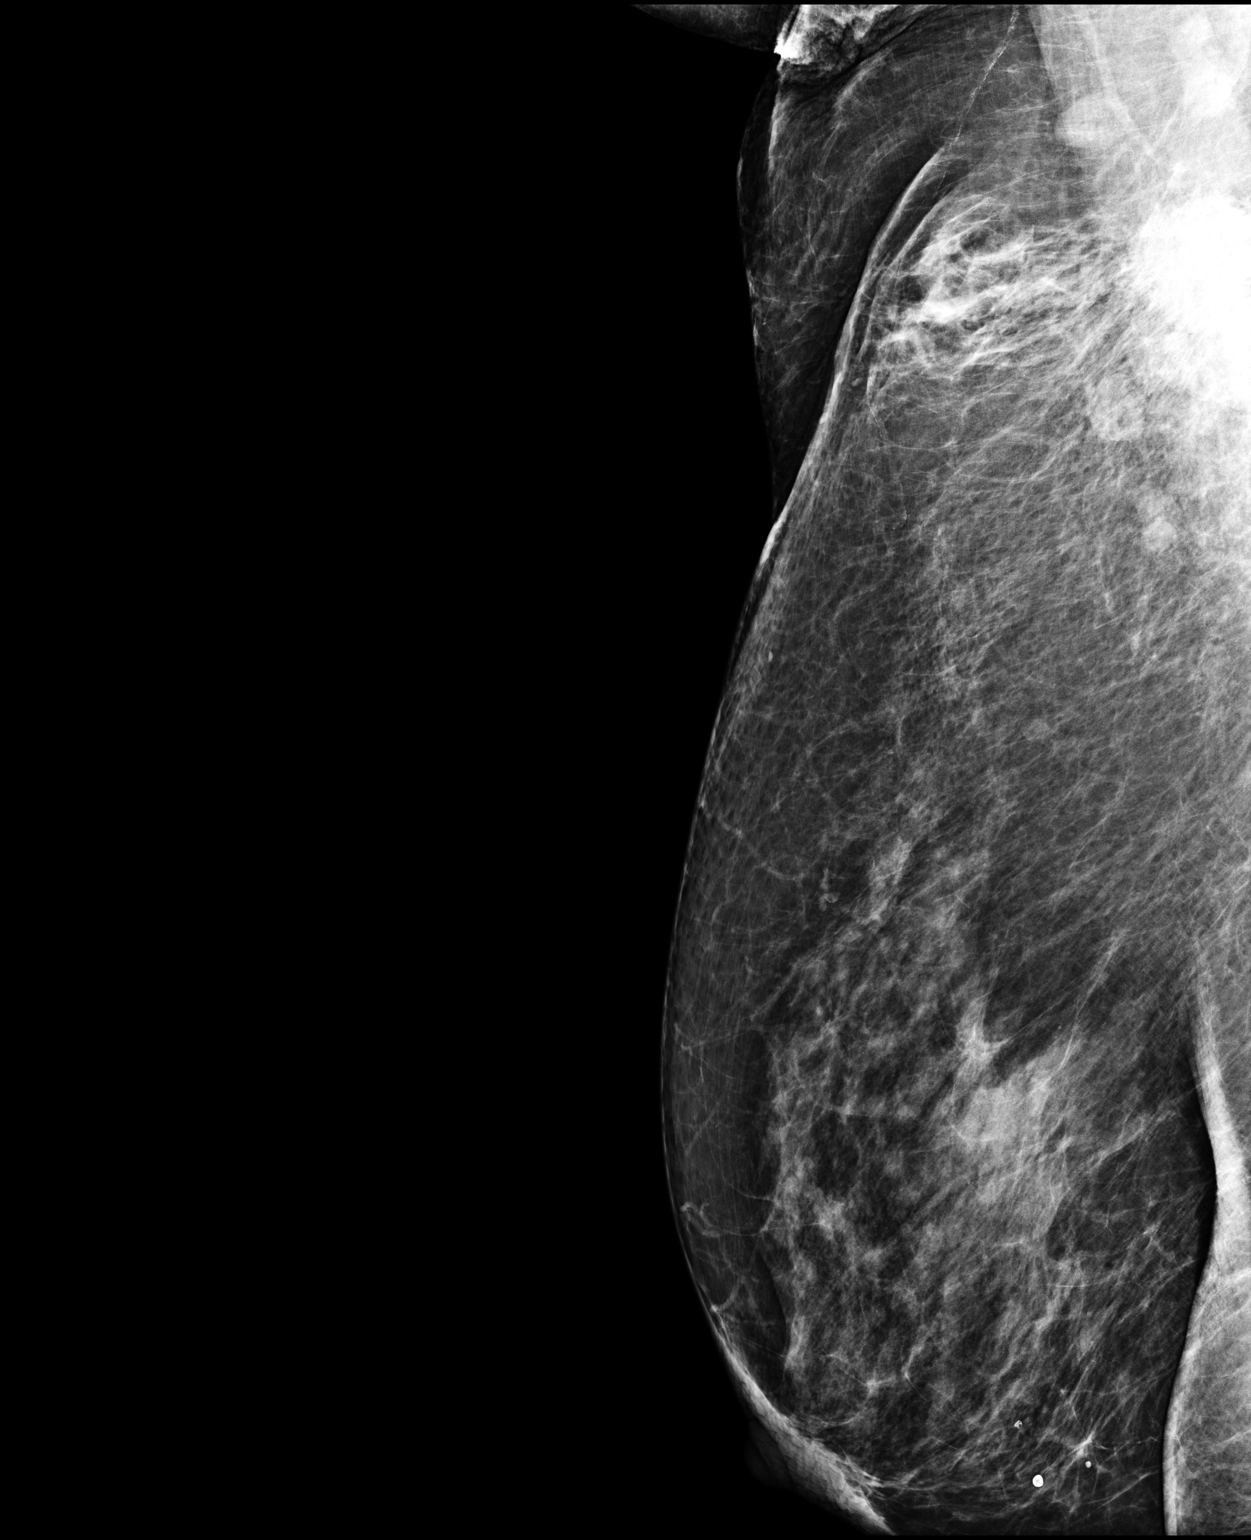

[1 of 1 positions shown; findings below may reference images not displayed]

FINDINGS: A right MLO view was obtained following ultrasound-guided biopsies
of a right axillary tail mass and an enlarged right axillary lymph
node. Post biopsy mammogram demonstrates the HydroMARK shape 4
butterfly marker to be in the expected location of the enlarged
right axillary lymph node and the ribbon shaped biopsy marker to be
in the expected location of the right axillary tail mass.
IMPRESSION: Appropriate marker positions as above.

Final Assessment: Post Procedure Mammograms for Marker Placement

## 2016-10-08 DIAGNOSIS — E782 Mixed hyperlipidemia: Secondary | ICD-10-CM | POA: Insufficient documentation

## 2016-10-09 LAB — SURGICAL PATHOLOGY

## 2016-10-10 ENCOUNTER — Other Ambulatory Visit: Payer: Self-pay

## 2016-10-12 NOTE — Progress Notes (Signed)
  Oncology Nurse Navigator Documentation  Navigator Location: [facility] (10/12/16 1000)   )Navigator Encounter Type: Introductory phone call (10/12/16 1000)   Abnormal Finding Date: 09/28/16 (10/12/16 1000) Confirmed Diagnosis Date: 10/04/16 (10/12/16 1000)               Patient Visit Type: Initial (10/12/16 1000)   Barriers/Navigation Needs: Coordination of Care;Education (10/12/16 1000) Education: Accessing Care/ Finding Providers;Coping with Diagnosis/ Prognosis;Newly Diagnosed Cancer Education (10/12/16 1000) Interventions: Coordination of Care (10/12/16 1000)   Coordination of Care: Appts (10/12/16 1000)                  Time Spent with Patient: 60 (10/12/16 1000)   Phoned patient to introduce Navigation Service, and confirm MedOnc visit scheduled for 10/18/26 at 10:00.  Reviewed data for case conference .

## 2016-10-16 DIAGNOSIS — C50911 Malignant neoplasm of unspecified site of right female breast: Secondary | ICD-10-CM | POA: Insufficient documentation

## 2016-10-16 NOTE — Progress Notes (Signed)
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  INTERVAL HISTORY: Patient is a 76 year old female who noted an enlarging mass in her right breast. Subsequent ultrasound and biopsy revealed the above stated breast cancer. She is anxious, but otherwise feels well. She has no neurologic complaints. She denies any recent fevers or illnesses. She has a good appetite and denies weight loss. She has no chest pain or shortness of breath. She denies any nausea, vomiting, constipation, or diarrhea. She has no urinary complaints. Patient offers no further specific complaints today.  REVIEW OF SYSTEMS:   Review of Systems  Constitutional: Negative.  Negative for fever, malaise/fatigue and weight loss.  Respiratory: Negative.  Negative for cough and shortness of breath.   Cardiovascular: Negative.  Negative for chest pain and leg swelling.  Gastrointestinal: Negative.  Negative for abdominal pain.  Genitourinary: Negative.   Musculoskeletal: Negative.   Skin: Negative.  Negative for rash.  Neurological: Negative.  Negative for weakness.  Psychiatric/Behavioral: The patient is nervous/anxious.     As per HPI. Otherwise, a complete review of systems is negative.  PAST MEDICAL HISTORY: Past Medical History:  Diagnosis Date  . Anxiety   . Arthritis   . Cancer [facility])    Right Breast Cancer  . COPD (chronic obstructive pulmonary disease) ([place])   . Dyspnea    with exertion  . GERD (gastroesophageal reflux disease)   . Hypertension     PAST SURGICAL HISTORY: Past Surgical History:  Procedure Laterality Date  . ABDOMINAL HYSTERECTOMY  1990   Partial  . BREAST BIOPSY Right 10/04/2016   axilla lymph node and axillay tail mass biopsy. Path pending  . DILATION AND CURETTAGE OF UTERUS      FAMILY HISTORY: Family History  Problem Relation Age of Onset  . Cancer Mother   . Breast cancer Neg Hx     ADVANCED DIRECTIVES (Y/N):  N  HEALTH MAINTENANCE: Social History  Substance Use Topics  . Smoking status: Former Smoker    Packs/day: 0.50    Types: Cigarettes    Quit date: 07/23/1988  . Smokeless tobacco: Never Used  . Alcohol use No     Colonoscopy:  PAP:  Bone density:  Lipid panel:  Allergies  Allergen Reactions  . Atorvastatin Other (See Comments)    Muscle Pain  . Gabapentin Swelling  . Penicillins Swelling    Current Outpatient Prescriptions  Medication Sig Dispense Refill  . ADVAIR DISKUS 500-50 MCG/DOSE AEPB Inhale 1 puff into the lungs 2 (two) times daily.     . baclofen (LIORESAL) 10 MG tablet Take 10 mg by mouth at bedtime.     Marland Kitchen co-enzyme Q-10 30 MG capsule Take 30 mg by mouth daily.    . DULoxetine (CYMBALTA) 60 MG capsule Take 1 capsule (60 mg total) by mouth daily. 30 capsule 0  . losartan-hydrochlorothiazide (HYZAAR) 100-25 MG tablet Take 1 tablet by mouth daily.     . montelukast (SINGULAIR) 10 MG tablet Take 10 mg by mouth daily.     . Potassium Chloride ER 20 MEQ TBCR Take 1 tablet by mouth daily.     Marland Kitchen ALPRAZolam (XANAX) 0.25 MG tablet Take 1 tablet (0.25 mg total) by mouth at bedtime as  needed for anxiety. 30 tablet 0  . DULoxetine (CYMBALTA) 30 MG capsule Take 30 mg by mouth daily.    Marland Kitchen ipratropium (ATROVENT HFA) 17 MCG/ACT inhaler Inhale 2 puffs into the lungs 3 (three) times daily.    . Omega-3 Fatty Acids (FISH OIL PO) Take 1 capsule by mouth daily.     Marland Kitchen omeprazole (PRILOSEC) 20 MG capsule Take 20 mg by mouth daily.     No current facility-administered medications for this visit.     OBJECTIVE: Vitals:   10/17/16 1038  BP: 140/72  Pulse: 75  Resp: 20  Temp: (!) 96.8 F (36 C)     Body mass index is 33.22 kg/m.    ECOG  FS:0 - Asymptomatic  General: Well-developed, well-nourished, no acute distress. Eyes: Pink conjunctiva, anicteric sclera. HEENT: Normocephalic, moist mucous membranes, clear oropharnyx. Breasts: Easily palpable right breast mass. Lungs: Clear to auscultation bilaterally. Heart: Regular rate and rhythm. No rubs, murmurs, or gallops. Abdomen: Soft, nontender, nondistended. No organomegaly noted, normoactive bowel sounds. Musculoskeletal: No edema, cyanosis, or clubbing. Neuro: Alert, answering all questions appropriately. Cranial nerves grossly intact. Skin: No rashes or petechiae noted. Psych: Normal affect. Lymphatics: No cervical, calvicular, axillary or inguinal LAD.   LAB RESULTS:  Lab Results  Component Value Date   NA 141 04/08/2014   K 3.9 04/08/2014   CL 107 04/08/2014   CO2 28 04/08/2014   GLUCOSE 95 04/08/2014   BUN 11 04/08/2014   CREATININE 1.09 04/08/2014   CALCIUM 8.8 04/08/2014   GFRNONAA 52 (L) 04/08/2014   GFRAA >60 04/08/2014    Lab Results  Component Value Date   WBC 13.7 (H) 04/08/2014   HGB 13.5 04/08/2014   HCT 42.6 04/08/2014   MCV 89 04/08/2014   PLT 267 04/08/2014     STUDIES: US Breast Ltd Uni Right Inc Axilla  Result Date: 09/28/2016 CLINICAL DATA:  Screening recall for right breast asymmetries. EXAM: 2D DIGITAL DIAGNOSTIC UNILATERAL RIGHT MAMMOGRAM WITH CAD AND ADJUNCT TOMO RIGHT BREAST ULTRASOUND COMPARISON:  Previous exam(s). ACR Breast Density Category c: The breast tissue is heterogeneously dense, which may obscure small masses. FINDINGS: Spot compression CC and MLO tomograms as well as XCCL tomograms were performed of the right breast. The initially questioned asymmetry seen in the outer right breast on the CC view only appears to resolve on the additional imaging with findings suggestive of an area of overlapping fibroglandular tissue. There is a spiculated mass partially visualized in the right axilla with several adjacent enlarged  axillary lymph nodes, with the large mass measuring approximately 2.4 cm. Mammographic images were processed with CAD. Physical examination of the right axillary tail reveals extreme tenderness with palpation. Targeted ultrasound of the outer right breast as well as right axilla was performed. No definite abnormality seen sonographically in the outer right breast. There is an irregular hypoechoic mass in the right axillary tail measuring 2.2 x 2.3 by 2.2 cm. This mass corresponds well with the mass seen at mammography. At least 3 adjacent enlarged right axillary lymph nodes are identified, the larger of which has a cortex thickness of 0.9 cm. IMPRESSION: 1. Highly suspicious spiculated mass in the right axillary tail, with findings concerning for breast carcinoma versus a large nodal metastasis with extra capsular extension. 2.  Several adjacent abnormal axillary lymph nodes. RECOMMENDATION: 1. Recommend ultrasound-guided biopsy of the spiculated mass in the right axillary tail. 2. Recommend ultrasound-guided biopsy of an abnormal lymph node in the right axilla. 3. Recommend breast MRI  to evaluate for primary site of malignancy if pathology from the right axillary tail mass does not demonstrate any lymphoid tissue. I have discussed the findings and recommendations with the patient. Results were also provided in writing at the conclusion of the visit. If applicable, a reminder letter will be sent to the patient regarding the next appointment. BI-RADS CATEGORY  5: Highly suggestive of malignancy. Electronically Signed   By: Everlean Alstrom M.D.   On: 09/28/2016 16:22   Mm Diag Breast Tomo Uni Right  Result Date: 09/28/2016 CLINICAL DATA:  Screening recall for right breast asymmetries. EXAM: 2D DIGITAL DIAGNOSTIC UNILATERAL RIGHT MAMMOGRAM WITH CAD AND ADJUNCT TOMO RIGHT BREAST ULTRASOUND COMPARISON:  Previous exam(s). ACR Breast Density Category c: The breast tissue is heterogeneously dense, which may obscure  small masses. FINDINGS: Spot compression CC and MLO tomograms as well as XCCL tomograms were performed of the right breast. The initially questioned asymmetry seen in the outer right breast on the CC view only appears to resolve on the additional imaging with findings suggestive of an area of overlapping fibroglandular tissue. There is a spiculated mass partially visualized in the right axilla with several adjacent enlarged axillary lymph nodes, with the large mass measuring approximately 2.4 cm. Mammographic images were processed with CAD. Physical examination of the right axillary tail reveals extreme tenderness with palpation. Targeted ultrasound of the outer right breast as well as right axilla was performed. No definite abnormality seen sonographically in the outer right breast. There is an irregular hypoechoic mass in the right axillary tail measuring 2.2 x 2.3 by 2.2 cm. This mass corresponds well with the mass seen at mammography. At least 3 adjacent enlarged right axillary lymph nodes are identified, the larger of which has a cortex thickness of 0.9 cm. IMPRESSION: 1. Highly suspicious spiculated mass in the right axillary tail, with findings concerning for breast carcinoma versus a large nodal metastasis with extra capsular extension. 2.  Several adjacent abnormal axillary lymph nodes. RECOMMENDATION: 1. Recommend ultrasound-guided biopsy of the spiculated mass in the right axillary tail. 2. Recommend ultrasound-guided biopsy of an abnormal lymph node in the right axilla. 3. Recommend breast MRI to evaluate for primary site of malignancy if pathology from the right axillary tail mass does not demonstrate any lymphoid tissue. I have discussed the findings and recommendations with the patient. Results were also provided in writing at the conclusion of the visit. If applicable, a reminder letter will be sent to the patient regarding the next appointment. BI-RADS CATEGORY  5: Highly suggestive of malignancy.  Electronically Signed   By: Everlean Alstrom M.D.   On: 09/28/2016 16:22   Mm Clip Placement Right  Result Date: 10/04/2016 CLINICAL DATA:  76 year old female status post ultrasound-guided biopsies of a right axillary tail mass and an enlarged right axillary lymph node EXAM: DIAGNOSTIC RIGHT MAMMOGRAM POST ULTRASOUND BIOPSY COMPARISON:  Previous exam(s). FINDINGS: A right MLO view was obtained following ultrasound-guided biopsies of a right axillary tail mass and an enlarged right axillary lymph node. Post biopsy mammogram demonstrates the [facility] shape 4 butterfly marker to be in the expected location of the enlarged right axillary lymph node and the ribbon shaped biopsy marker to be in the expected location of the right axillary tail mass. IMPRESSION: Appropriate marker positions as above. Final Assessment: Post Procedure Mammograms for Marker Placement Electronically Signed   By: Pamelia Hoit M.D.   On: 10/04/2016 09:23   [place] Rt Breast Bx W Loc Dev 1st Lesion Img Bx Spec  US Guide  Addendum Date: 10/11/2016   ADDENDUM REPORT: 10/11/2016 15:02 ADDENDUM: Pathology of the right axillary lymph node biopsy revealed A. LYMPH NODE, RIGHT AXILLA; ULTRASOUND-GUIDED CORE BIOPSY: METASTATIC MAMMARY CARCINOMA. Comment: The metastasis measures at least 10 mm. It is morphologically similar to the right axillary tail mass. Please see the concurrent biopsy from the right axillary tail mass for immunohistochemistry/biomarker results, [id number]. This was found to be concordant by Dr. Brigitte Pulse. Recommendation: Recommend bilateral breast MRI with and without contrast to evaluate for a primary site. Also recommend oncology referral. The patient was contacted by phone by Jetta Lout, [place] on 10/06/16 for a post biopsy check. The patient stated she has a bruise but no bleeding or hematoma. Post biopsy instructions were reviewed with the patient. All of her questions were answered. She was encouraged to call the [facility] with any further questions or concerns. The pathology report was not available at the time of conversation. The patient was told that she will be contacted with results as soon as possible. A preliminary report had been relayed to Dr. Iona Beard by the pathologist on 10/06/16. Per notes in [place], Dr. Iona Beard will contact the patient and make the referral. On 10/10/16 following the final report on the evening of 10/09/16, recommendations for the breast MRI was relayed to Dr. Cleon Dew nurse Durward Parcel, RN. She will relay that information to Dr. Iona Beard. The patient was contacted again on 10/11/16 by Jetta Lout, RRA to confirm that the patient had been given her results. She stated that Dr. Iona Beard called her on 10/08/16. An oncology appointment has been made with Dr. Grayland Ormond for 10/16/16 at 5:15 PM. Addendum by Jetta Lout, [place] on 10/11/16. Electronically Signed   By: Pamelia Hoit M.D.   On: 10/11/2016 15:02   Result Date: 10/11/2016 CLINICAL DATA:  76 year old female for ultrasound-guided biopsy of an enlarged right axillary lymph nodes EXAM: ULTRASOUND GUIDED CORE NEEDLE BIOPSY OF A RIGHT AXILLARY NODE COMPARISON:  Previous exam(s). FINDINGS: I met with the patient and we discussed the procedure of ultrasound-guided biopsy, including benefits and alternatives. We discussed the high likelihood of a successful procedure. We discussed the risks of the procedure, including infection, bleeding, tissue injury, clip migration, and inadequate sampling. Informed written consent was given. The usual time-out protocol was performed immediately prior to the procedure. Using sterile technique and 1% Lidocaine as local anesthetic, under direct ultrasound visualization, a 14 gauge spring-loaded device was used to perform biopsy of an enlarged right axillary lymph node using a lateral to medial approach. Core samples were sent in both formalin and saline. At the conclusion of the procedure a HydroMARK shape 4 butterfly tissue  marker clip was deployed into the biopsy cavity. Follow up was performed and dictated separately. IMPRESSION: Ultrasound guided biopsy of an enlarged right axillary lymph node. No apparent complications. Electronically Signed: By: Pamelia Hoit M.D. On: 10/04/2016 09:24   [place] Rt Breast Bx W Loc Dev Ea Add Lesion Img Bx Spec US Guide  Addendum Date: 10/11/2016   ADDENDUM REPORT: 10/11/2016 14:33 ADDENDUM: Pathology of the right axillary tail mass revealed A. AXILLARY TAIL MASS, RIGHT; ULTRASOUND-GUIDED CORE BIOPSY: INVASIVE MAMMARY CARCINOMA, NO SPECIAL TYPE, GRADE 3, SEE COMMENT. Comment: No nodal remnant, in situ component, or benign mammary tissue is present in this specimen, so it cannot be reliably determined if this is the primary site by histology. Correlation with imaging results is recommended; additional evaluation may be needed if there is concern about another possible primary  site. Size of invasive carcinoma: 14 mm in this sample. Histologic grade of invasive carcinoma: Grade 3. Ductal carcinoma in situ: Not identified. Lymphovascular invasion: Not identified. Immunohistochemistry for GATA3 was performed, and the carcinoma demonstrates diffuse moderate nuclear staining, supporting breast origin. These findings were communicated to [place] in the [facility] on 10/09/16 at 3:00 PM. Read-back procedure was performed. Please see also the separate report from the concurrent right axillary lymph node biopsy showing metastatic carcinoma ([id number]). This was found to be concordant by Dr. Brigitte Pulse. Recommendation: Recommend bilateral breast MRI with and without contrast to evaluate for a primary site. Also recommend oncology referral. The patient was contacted by phone by Jetta Lout, [facility]) for a post biopsy check on 10/06/16 at 3:40 PM. The patient stated she had done well following the biopsy with only a bruise but no bleeding or hematoma. Post biopsy instructions  were reviewed with the patient. All of her questions were answered. Results were not available at the time of conversation. The patient was told she will be contacted with results. She was encouraged to call the [facility] with any further questions or concerns. Preliminary results were discussed with Dr. Iona Beard by the pathologist on 10/06/16. The final report became available on 10/09/16. Results and recommendations for the breast MRI was relayed to Durward Parcel, RN, nurse in Dr. Cleon Dew office. She will relay that information to Dr. Iona Beard. The patient was contacted again by phone by Jetta Lout, [place] on 10/11/16. The patient stated Dr. Iona Beard had contacted her with results on 10/08/16. An oncology appointment has been made with Dr. Grayland Ormond for 10/16/16 at 5:15 PM. Addendum by Jetta Lout, [place] on 10/11/16. Electronically Signed   By: Pamelia Hoit M.D.   On: 10/11/2016 14:33   Result Date: 10/11/2016 CLINICAL DATA:  76 year old female for ultrasound-guided biopsy of a right axillary tail mass EXAM: ULTRASOUND GUIDED RIGHT BREAST CORE NEEDLE BIOPSY COMPARISON:  Previous exam(s). FINDINGS: I met with the patient and we discussed the procedure of ultrasound-guided biopsy, including benefits and alternatives. We discussed the high likelihood of a successful procedure. We discussed the risks of the procedure, including infection, bleeding, tissue injury, clip migration, and inadequate sampling. Informed written consent was given. The usual time-out protocol was performed immediately prior to the procedure. Using sterile technique and 1% Lidocaine as local anesthetic, under direct ultrasound visualization, a 14 gauge spring-loaded device was used to perform biopsy of a right axillary tail mass using a lateral to medial approach. At the conclusion of the procedure a ribbon shaped tissue marker clip was deployed into the biopsy cavity. Follow up mammogram was performed and dictated separately. IMPRESSION:  Ultrasound guided biopsy of a right axillary tail mass. No apparent complications. Electronically Signed: By: Pamelia Hoit M.D. 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  PLAN:    1. 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 Patient will require Neulasta support. Prior to initiating treatment patient will require MUGA scan as well as port placement. At the conclusion of her treatment, she will be referred to surgery for lumpectomy. Patient will require adjuvant XRT if she has a lumpectomy. Finally, given the PR positivity of her tumor  she will benefit from an aromatase inhibitor for 5 years. Return to clinic on October 31, 2016 to initiate cycle 1 of 6 of neoadjuvant TCH-P.  Patient expressed understanding and was in agreement with this plan. She also understands that She can call clinic at any time with any questions, concerns, or complaints.   Cancer Staging Primary cancer of upper outer quadrant of right female breast [facility]) Staging form: Breast, AJCC 8th Edition - Clinical stage from 10/16/2016: Stage IIB (cT2, cN1, cM0, G3, ER: Negative, PR: Positive, HER2: Positive) - Signed by Lloyd Huger, MD on 10/16/2016   Lloyd Huger, MD   10/20/2016 3:59 PM

## 2016-10-17 ENCOUNTER — Encounter: Payer: Self-pay | Admitting: *Deleted

## 2016-10-17 ENCOUNTER — Encounter (INDEPENDENT_AMBULATORY_CARE_PROVIDER_SITE_OTHER): Payer: Self-pay

## 2016-10-17 ENCOUNTER — Inpatient Hospital Stay: Payer: Medicare HMO | Attending: Oncology | Admitting: Oncology

## 2016-10-17 VITALS — BP 140/72 | HR 75 | Temp 96.8°F | Resp 20 | Wt 181.6 lb

## 2016-10-17 DIAGNOSIS — C773 Secondary and unspecified malignant neoplasm of axilla and upper limb lymph nodes: Secondary | ICD-10-CM | POA: Diagnosis not present

## 2016-10-17 DIAGNOSIS — I10 Essential (primary) hypertension: Secondary | ICD-10-CM | POA: Diagnosis not present

## 2016-10-17 DIAGNOSIS — Z171 Estrogen receptor negative status [ER-]: Secondary | ICD-10-CM | POA: Diagnosis not present

## 2016-10-17 DIAGNOSIS — K219 Gastro-esophageal reflux disease without esophagitis: Secondary | ICD-10-CM | POA: Insufficient documentation

## 2016-10-17 DIAGNOSIS — Z87891 Personal history of nicotine dependence: Secondary | ICD-10-CM | POA: Diagnosis not present

## 2016-10-17 DIAGNOSIS — C50411 Malignant neoplasm of upper-outer quadrant of right female breast: Secondary | ICD-10-CM | POA: Diagnosis present

## 2016-10-17 DIAGNOSIS — J449 Chronic obstructive pulmonary disease, unspecified: Secondary | ICD-10-CM | POA: Insufficient documentation

## 2016-10-17 DIAGNOSIS — F419 Anxiety disorder, unspecified: Secondary | ICD-10-CM | POA: Insufficient documentation

## 2016-10-17 DIAGNOSIS — Z79899 Other long term (current) drug therapy: Secondary | ICD-10-CM | POA: Diagnosis not present

## 2016-10-17 DIAGNOSIS — Z7189 Other specified counseling: Secondary | ICD-10-CM

## 2016-10-17 MED ORDER — ALPRAZOLAM 0.25 MG PO TABS: 0.2500 mg | ORAL_TABLET | Freq: Every evening | ORAL | 0 refills | Status: DC | PRN

## 2016-10-17 NOTE — Progress Notes (Signed)
Patient here today for initial evaluation regarding breast cancer.  

## 2016-10-17 NOTE — Progress Notes (Signed)
  Oncology Nurse Navigator Documentation  Navigator Location: [facility]-Med Onc (10/17/16 1400)   )Navigator Encounter Type: Initial MedOnc (10/17/16 1400)                       Treatment Phase: Pre-Tx/Tx Discussion (10/17/16 1400) Barriers/Navigation Needs: Education (10/17/16 1400) Education: Understanding Cancer/ Treatment Options;Newly Diagnosed Cancer Education (10/17/16 1400)                        Time Spent with Patient: 60 (10/17/16 1400)   Met with patient and her daughter today during her initial medical oncology consult with Dr. Grayland Ormond.  Patient is being scheduled for neoadjuvant chemotherapy with Taxotere/Carboplatin/Herceptin and Perjeta.  Gave patient breast cancer educational literature, "My Breast Cancer Treatment Handbook" by Josephine Igo, RN.  She or her daughter are to call if they have any questions or needs.

## 2016-10-18 ENCOUNTER — Other Ambulatory Visit: Payer: Self-pay

## 2016-10-19 NOTE — Patient Instructions (Signed)
Pertuzumab injection What is this medicine? PERTUZUMAB (per TOOZ ue mab) is a monoclonal antibody. It is used to treat breast cancer. This medicine may be used for other purposes; ask your health care provider or pharmacist if you have questions. COMMON BRAND NAME(S): PERJETA What should I tell my health care provider before I take this medicine? They need to know if you have any of these conditions: -heart disease -heart failure -high blood pressure -history of irregular heart beat -recent or ongoing radiation therapy -an unusual or allergic reaction to pertuzumab, other medicines, foods, dyes, or preservatives -pregnant or trying to get pregnant -breast-feeding How should I use this medicine? This medicine is for infusion into a vein. It is given by a health care professional in a hospital or clinic setting. Talk to your pediatrician regarding the use of this medicine in children. Special care may be needed. Overdosage: If you think you have taken too much of this medicine contact a poison control center or emergency room at once. NOTE: This medicine is only for you. Do not share this medicine with others. What if I miss a dose? It is important not to miss your dose. Call your doctor or health care professional if you are unable to keep an appointment. What may interact with this medicine? Interactions are not expected. Give your health care provider a list of all the medicines, herbs, non-prescription drugs, or dietary supplements you use. Also tell them if you smoke, drink alcohol, or use illegal drugs. Some items may interact with your medicine. This list may not describe all possible interactions. Give your health care provider a list of all the medicines, herbs, non-prescription drugs, or dietary supplements you use. Also tell them if you smoke, drink alcohol, or use illegal drugs. Some items may interact with your medicine. What should I watch for while using this medicine? Your  condition will be monitored carefully while you are receiving this medicine. Report any side effects. Continue your course of treatment even though you feel ill unless your doctor tells you to stop. Do not become pregnant while taking this medicine or for 7 months after stopping it. Women should inform their doctor if they wish to become pregnant or think they might be pregnant. Women of child-bearing potential will need to have a negative pregnancy test before starting this medicine. There is a potential for serious side effects to an unborn child. Talk to your health care professional or pharmacist for more information. Do not breast-feed an infant while taking this medicine or for 7 months after stopping it. Women must use effective birth control with this medicine. Call your doctor or health care professional for advice if you get a fever, chills or sore throat, or other symptoms of a cold or flu. Do not treat yourself. Try to avoid being around people who are sick. You may experience fever, chills, and headache during the infusion. Report any side effects during the infusion to your health care professional. What side effects may I notice from receiving this medicine? Side effects that you should report to your doctor or health care professional as soon as possible: -breathing problems -chest pain or palpitations -dizziness -feeling faint or lightheaded -fever or chills -skin rash, itching or hives -sore throat -swelling of the face, lips, or tongue -swelling of the legs or ankles -unusually weak or tired Side effects that usually do not require medical attention (report to your doctor or health care professional if they continue or are bothersome): -diarrhea -hair   loss -nausea, vomiting -tiredness This list may not describe all possible side effects. Call your doctor for medical advice about side effects. You may report side effects to FDA at 1-800-FDA-1088. Where should I keep my  medicine? This drug is given in a hospital or clinic and will not be stored at home. NOTE: This sheet is a summary. It may not cover all possible information. If you have questions about this medicine, talk to your doctor, pharmacist, or health care provider.  2018 Else[employer]/Gold Standard (2015-05-13 12:08:50) Trastuzumab injection for infusion What is this medicine? TRASTUZUMAB (tras TOO zoo mab) is a monoclonal antibody. It is used to treat breast cancer and stomach cancer. This medicine may be used for other purposes; ask your health care provider or pharmacist if you have questions. COMMON BRAND NAME(S): Herceptin What should I tell my health care provider before I take this medicine? They need to know if you have any of these conditions: -heart disease -heart failure -lung or breathing disease, like asthma -an unusual or allergic reaction to trastuzumab, benzyl alcohol, or other medications, foods, dyes, or preservatives -pregnant or trying to get pregnant -breast-feeding How should I use this medicine? This drug is given as an infusion into a vein. It is administered in a hospital or clinic by a specially trained health care professional. Talk to your pediatrician regarding the use of this medicine in children. This medicine is not approved for use in children. Overdosage: If you think you have taken too much of this medicine contact a poison control center or emergency room at once. NOTE: This medicine is only for you. Do not share this medicine with others. What if I miss a dose? It is important not to miss a dose. Call your doctor or health care professional if you are unable to keep an appointment. What may interact with this medicine? This medicine may interact with the following medications: -certain types of chemotherapy, such as daunorubicin, doxorubicin, epirubicin, and idarubicin This list may not describe all possible interactions. Give your health care provider a list of  all the medicines, herbs, non-prescription drugs, or dietary supplements you use. Also tell them if you smoke, drink alcohol, or use illegal drugs. Some items may interact with your medicine. What should I watch for while using this medicine? Visit your doctor for checks on your progress. Report any side effects. Continue your course of treatment even though you feel ill unless your doctor tells you to stop. Call your doctor or health care professional for advice if you get a fever, chills or sore throat, or other symptoms of a cold or flu. Do not treat yourself. Try to avoid being around people who are sick. You may experience fever, chills and shaking during your first infusion. These effects are usually mild and can be treated with other medicines. Report any side effects during the infusion to your health care professional. Fever and chills usually do not happen with later infusions. Do not become pregnant while taking this medicine or for 7 months after stopping it. Women should inform their doctor if they wish to become pregnant or think they might be pregnant. Women of child-bearing potential will need to have a negative pregnancy test before starting this medicine. There is a potential for serious side effects to an unborn child. Talk to your health care professional or pharmacist for more information. Do not breast-feed an infant while taking this medicine or for 7 months after stopping it. Women must use effective birth control   with this medicine. What side effects may I notice from receiving this medicine? Side effects that you should report to your doctor or health care professional as soon as possible: -allergic reactions like skin rash, itching or hives, swelling of the face, lips, or tongue -chest pain or palpitations -cough -dizziness -feeling faint or lightheaded, falls -fever -general ill feeling or flu-like symptoms -signs of worsening heart failure like breathing problems;  swelling in your legs and feet -unusually weak or tired Side effects that usually do not require medical attention (report to your doctor or health care professional if they continue or are bothersome): -bone pain -changes in taste -diarrhea -joint pain -nausea/vomiting -weight loss This list may not describe all possible side effects. Call your doctor for medical advice about side effects. You may report side effects to FDA at 1-800-FDA-1088. Where should I keep my medicine? This drug is given in a hospital or clinic and will not be stored at home. NOTE: This sheet is a summary. It may not cover all possible information. If you have questions about this medicine, talk to your doctor, pharmacist, or health care provider.  2018 Elsevier/Gold Standard (2016-04-04 14:37:52) Docetaxel injection What is this medicine? DOCETAXEL (doe se TAX el) is a chemotherapy drug. It targets fast dividing cells, like cancer cells, and causes these cells to die. This medicine is used to treat many types of cancers like breast cancer, certain stomach cancers, head and neck cancer, lung cancer, and prostate cancer. This medicine may be used for other purposes; ask your health care provider or pharmacist if you have questions. COMMON BRAND NAME(S): Docefrez, Taxotere What should I tell my health care provider before I take this medicine? They need to know if you have any of these conditions: -infection (especially a virus infection such as chickenpox, cold sores, or herpes) -liver disease -low blood counts, like low white cell, platelet, or red cell counts -an unusual or allergic reaction to docetaxel, polysorbate 80, other chemotherapy agents, other medicines, foods, dyes, or preservatives -pregnant or trying to get pregnant -breast-feeding How should I use this medicine? This drug is given as an infusion into a vein. It is administered in a hospital or clinic by a specially trained health care  professional. Talk to your pediatrician regarding the use of this medicine in children. Special care may be needed. Overdosage: If you think you have taken too much of this medicine contact a poison control center or emergency room at once. NOTE: This medicine is only for you. Do not share this medicine with others. What if I miss a dose? It is important not to miss your dose. Call your doctor or health care professional if you are unable to keep an appointment. What may interact with this medicine? -cyclosporine -erythromycin -ketoconazole -medicines to increase blood counts like filgrastim, pegfilgrastim, sargramostim -vaccines Talk to your doctor or health care professional before taking any of these medicines: -acetaminophen -aspirin -ibuprofen -ketoprofen -naproxen This list may not describe all possible interactions. Give your health care provider a list of all the medicines, herbs, non-prescription drugs, or dietary supplements you use. Also tell them if you smoke, drink alcohol, or use illegal drugs. Some items may interact with your medicine. What should I watch for while using this medicine? Your condition will be monitored carefully while you are receiving this medicine. You will need important blood work done while you are taking this medicine. This drug may make you feel generally unwell. This is not uncommon, as chemotherapy can   affect healthy cells as well as cancer cells. Report any side effects. Continue your course of treatment even though you feel ill unless your doctor tells you to stop. In some cases, you may be given additional medicines to help with side effects. Follow all directions for their use. Call your doctor or health care professional for advice if you get a fever, chills or sore throat, or other symptoms of a cold or flu. Do not treat yourself. This drug decreases your body's ability to fight infections. Try to avoid being around people who are sick. This  medicine may increase your risk to bruise or bleed. Call your doctor or health care professional if you notice any unusual bleeding. This medicine may contain alcohol in the product. You may get drowsy or dizzy. Do not drive, use machinery, or do anything that needs mental alertness until you know how this medicine affects you. Do not stand or sit up quickly, especially if you are an older patient. This reduces the risk of dizzy or fainting spells. Avoid alcoholic drinks. Do not become pregnant while taking this medicine. Women should inform their doctor if they wish to become pregnant or think they might be pregnant. There is a potential for serious side effects to an unborn child. Talk to your health care professional or pharmacist for more information. Do not breast-feed an infant while taking this medicine. What side effects may I notice from receiving this medicine? Side effects that you should report to your doctor or health care professional as soon as possible: -allergic reactions like skin rash, itching or hives, swelling of the face, lips, or tongue -low blood counts - This drug may decrease the number of white blood cells, red blood cells and platelets. You may be at increased risk for infections and bleeding. -signs of infection - fever or chills, cough, sore throat, pain or difficulty passing urine -signs of decreased platelets or bleeding - bruising, pinpoint red spots on the skin, black, tarry stools, nosebleeds -signs of decreased red blood cells - unusually weak or tired, fainting spells, lightheadedness -breathing problems -fast or irregular heartbeat -low blood pressure -mouth sores -nausea and vomiting -pain, swelling, redness or irritation at the injection site -pain, tingling, numbness in the hands or feet -swelling of the ankle, feet, hands -weight gain Side effects that usually do not require medical attention (report to your doctor or health care professional if they  continue or are bothersome): -bone pain -complete hair loss including hair on your head, underarms, pubic hair, eyebrows, and eyelashes -diarrhea -excessive tearing -changes in the color of fingernails -loosening of the fingernails -nausea -muscle pain -red flush to skin -sweating -weak or tired This list may not describe all possible side effects. Call your doctor for medical advice about side effects. You may report side effects to FDA at 1-800-FDA-1088. Where should I keep my medicine? This drug is given in a hospital or clinic and will not be stored at home. NOTE: This sheet is a summary. It may not cover all possible information. If you have questions about this medicine, talk to your doctor, pharmacist, or health care provider.  2018 Elsevier/Gold Standard (2015-05-13 12:32:56) Carboplatin injection What is this medicine? CARBOPLATIN (KAR boe pla tin) is a chemotherapy drug. It targets fast dividing cells, like cancer cells, and causes these cells to die. This medicine is used to treat ovarian cancer and many other cancers. This medicine may be used for other purposes; ask your health care provider or pharmacist if   you have questions. COMMON BRAND NAME(S): Paraplatin What should I tell my health care provider before I take this medicine? They need to know if you have any of these conditions: -blood disorders -hearing problems -kidney disease -recent or ongoing radiation therapy -an unusual or allergic reaction to carboplatin, cisplatin, other chemotherapy, other medicines, foods, dyes, or preservatives -pregnant or trying to get pregnant -breast-feeding How should I use this medicine? This drug is usually given as an infusion into a vein. It is administered in a hospital or clinic by a specially trained health care professional. Talk to your pediatrician regarding the use of this medicine in children. Special care may be needed. Overdosage: If you think you have taken too  much of this medicine contact a poison control center or emergency room at once. NOTE: This medicine is only for you. Do not share this medicine with others. What if I miss a dose? It is important not to miss a dose. Call your doctor or health care professional if you are unable to keep an appointment. What may interact with this medicine? -medicines for seizures -medicines to increase blood counts like filgrastim, pegfilgrastim, sargramostim -some antibiotics like amikacin, gentamicin, neomycin, streptomycin, tobramycin -vaccines Talk to your doctor or health care professional before taking any of these medicines: -acetaminophen -aspirin -ibuprofen -ketoprofen -naproxen This list may not describe all possible interactions. Give your health care provider a list of all the medicines, herbs, non-prescription drugs, or dietary supplements you use. Also tell them if you smoke, drink alcohol, or use illegal drugs. Some items may interact with your medicine. What should I watch for while using this medicine? Your condition will be monitored carefully while you are receiving this medicine. You will need important blood work done while you are taking this medicine. This drug may make you feel generally unwell. This is not uncommon, as chemotherapy can affect healthy cells as well as cancer cells. Report any side effects. Continue your course of treatment even though you feel ill unless your doctor tells you to stop. In some cases, you may be given additional medicines to help with side effects. Follow all directions for their use. Call your doctor or health care professional for advice if you get a fever, chills or sore throat, or other symptoms of a cold or flu. Do not treat yourself. This drug decreases your body's ability to fight infections. Try to avoid being around people who are sick. This medicine may increase your risk to bruise or bleed. Call your doctor or health care professional if you  notice any unusual bleeding. Be careful brushing and flossing your teeth or using a toothpick because you may get an infection or bleed more easily. If you have any dental work done, tell your dentist you are receiving this medicine. Avoid taking products that contain aspirin, acetaminophen, ibuprofen, naproxen, or ketoprofen unless instructed by your doctor. These medicines may hide a fever. Do not become pregnant while taking this medicine. Women should inform their doctor if they wish to become pregnant or think they might be pregnant. There is a potential for serious side effects to an unborn child. Talk to your health care professional or pharmacist for more information. Do not breast-feed an infant while taking this medicine. What side effects may I notice from receiving this medicine? Side effects that you should report to your doctor or health care professional as soon as possible: -allergic reactions like skin rash, itching or hives, swelling of the face, lips, or tongue -signs   of infection - fever or chills, cough, sore throat, pain or difficulty passing urine -signs of decreased platelets or bleeding - bruising, pinpoint red spots on the skin, black, tarry stools, nosebleeds -signs of decreased red blood cells - unusually weak or tired, fainting spells, lightheadedness -breathing problems -changes in hearing -changes in vision -chest pain -high blood pressure -low blood counts - This drug may decrease the number of white blood cells, red blood cells and platelets. You may be at increased risk for infections and bleeding. -nausea and vomiting -pain, swelling, redness or irritation at the injection site -pain, tingling, numbness in the hands or feet -problems with balance, talking, walking -trouble passing urine or change in the amount of urine Side effects that usually do not require medical attention (report to your doctor or health care professional if they continue or are  bothersome): -hair loss -loss of appetite -metallic taste in the mouth or changes in taste This list may not describe all possible side effects. Call your doctor for medical advice about side effects. You may report side effects to FDA at 1-800-FDA-1088. Where should I keep my medicine? This drug is given in a hospital or clinic and will not be stored at home. NOTE: This sheet is a summary. It may not cover all possible information. If you have questions about this medicine, talk to your doctor, pharmacist, or health care provider.  2018 Elsevier/Gold Standard (2007-07-16 14:38:05)  

## 2016-10-20 ENCOUNTER — Ambulatory Visit
Admission: RE | Admit: 2016-10-20 | Discharge: 2016-10-20 | Disposition: A | Discharge status: Home / Self Care | Payer: Medicare HMO | Source: Ambulatory Visit | Attending: Oncology | Admitting: Oncology

## 2016-10-20 ENCOUNTER — Ambulatory Visit
Admission: RE | Admit: 2016-10-20 | Discharge: 2016-10-20 | Disposition: A | Discharge status: Home / Self Care | Payer: Medicare HMO | Source: Ambulatory Visit | Attending: Cardiothoracic Surgery | Admitting: Cardiothoracic Surgery

## 2016-10-20 ENCOUNTER — Ambulatory Visit (INDEPENDENT_AMBULATORY_CARE_PROVIDER_SITE_OTHER): Payer: Medicare HMO | Admitting: Cardiothoracic Surgery

## 2016-10-20 ENCOUNTER — Encounter
Admission: RE | Admit: 2016-10-20 | Discharge: 2016-10-20 | Disposition: A | Discharge status: Home / Self Care | Payer: Medicare HMO | Source: Ambulatory Visit | Attending: Cardiothoracic Surgery | Admitting: Cardiothoracic Surgery

## 2016-10-20 ENCOUNTER — Encounter: Payer: Self-pay | Admitting: Cardiothoracic Surgery

## 2016-10-20 VITALS — BP 174/92 | HR 86 | Temp 98.1°F | Resp 18 | Ht 62.0 in | Wt 182.8 lb

## 2016-10-20 DIAGNOSIS — Z7189 Other specified counseling: Secondary | ICD-10-CM | POA: Insufficient documentation

## 2016-10-20 DIAGNOSIS — C50011 Malignant neoplasm of nipple and areola, right female breast: Secondary | ICD-10-CM | POA: Diagnosis not present

## 2016-10-20 DIAGNOSIS — C50411 Malignant neoplasm of upper-outer quadrant of right female breast: Secondary | ICD-10-CM | POA: Diagnosis present

## 2016-10-20 DIAGNOSIS — I10 Essential (primary) hypertension: Secondary | ICD-10-CM | POA: Insufficient documentation

## 2016-10-20 DIAGNOSIS — I700 Atherosclerosis of aorta: Secondary | ICD-10-CM | POA: Diagnosis not present

## 2016-10-20 DIAGNOSIS — Z01812 Encounter for preprocedural laboratory examination: Secondary | ICD-10-CM | POA: Diagnosis present

## 2016-10-20 DIAGNOSIS — Z01818 Encounter for other preprocedural examination: Secondary | ICD-10-CM | POA: Insufficient documentation

## 2016-10-20 DIAGNOSIS — J439 Emphysema, unspecified: Secondary | ICD-10-CM | POA: Insufficient documentation

## 2016-10-20 HISTORY — DX: Unspecified osteoarthritis, unspecified site: M19.90

## 2016-10-20 HISTORY — DX: Malignant (primary) neoplasm, unspecified: C80.1

## 2016-10-20 HISTORY — DX: Anxiety disorder, unspecified: F41.9

## 2016-10-20 HISTORY — DX: Gastro-esophageal reflux disease without esophagitis: K21.9

## 2016-10-20 HISTORY — DX: Dyspnea, unspecified: R06.00

## 2016-10-20 LAB — COMPREHENSIVE METABOLIC PANEL
ALBUMIN: 3.6 g/dL (ref 3.5–5.0)
ALT: 15 U/L (ref 14–54)
AST: 23 U/L (ref 15–41)
Alkaline Phosphatase: 70 U/L (ref 38–126)
Anion gap: 8 (ref 5–15)
BUN: 16 mg/dL (ref 6–20)
CHLORIDE: 103 mmol/L (ref 101–111)
CO2: 30 mmol/L (ref 22–32)
Calcium: 9.4 mg/dL (ref 8.9–10.3)
Creatinine, Ser: 1.03 mg/dL — ABNORMAL HIGH (ref 0.44–1.00)
GFR calc Af Amer: >60 mL/min (ref >60)
GFR calc non Af Amer: 52 mL/min — ABNORMAL LOW (ref >60)
GLUCOSE: 115 mg/dL — AB (ref 65–99)
POTASSIUM: 3.6 mmol/L (ref 3.5–5.1)
Sodium: 141 mmol/L (ref 135–145)
Total Bilirubin: 0.6 mg/dL (ref 0.3–1.2)
Total Protein: 7.7 g/dL (ref 6.5–8.1)

## 2016-10-20 LAB — CBC
HEMATOCRIT: 44.7 % (ref 35.0–47.0)
Hemoglobin: 14.5 g/dL (ref 12.0–16.0)
MCH: 29 pg (ref 26.0–34.0)
MCHC: 32.4 g/dL (ref 32.0–36.0)
MCV: 89.6 fL (ref 80.0–100.0)
PLATELETS: 316 10*3/uL (ref 150–440)
RBC: 4.99 MIL/uL (ref 3.80–5.20)
RDW: 14.4 % (ref 11.5–14.5)
WBC: 11.6 10*3/uL — AB (ref 3.6–11.0)

## 2016-10-20 LAB — PROTIME-INR
INR: 1.11
Prothrombin Time: 14.4 seconds (ref 11.4–15.2)

## 2016-10-20 LAB — APTT: APTT: 34 s (ref 24–36)

## 2016-10-20 IMAGING — NM NM CARDIA MUGA REST
7 series · 27 of 27 positions shown · non-contrast
Comparison: None.

CLINICAL DATA: Pre chemotherapy for breast cancer with cardiotoxic
drugs.

EXAM:
NUCLEAR MEDICINE CARDIAC BLOOD POOL IMAGING (MUGA)
TECHNIQUE: Cardiac multi-gated acquisition was performed at rest following
intravenous injection of [40] labeled red blood cells.
RADIOPHARMACEUTICALS:  21.2 mCi [40] MDP in-vitro labeled red
blood cells IV

[Series 1000: lao 70 · 3.30mm/px · 1 of 1 slices shown]
[im 1/1]
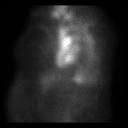

[Series 1000: ant-gated · 3.30mm/px · 6 of 24 frames shown]
[frame 3/24]
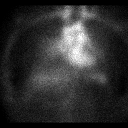
[frame 7/24]
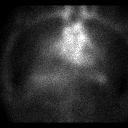
[frame 11/24]
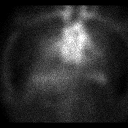
[frame 15/24]
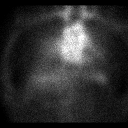
[frame 19/24]
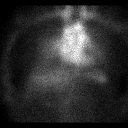
[frame 23/24]
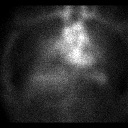

[Series 1000: ant · 3.30mm/px · 1 of 1 slices shown]
[im 1/1]
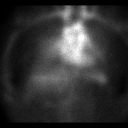

[Series 1000: lao 45-gated (results) · 3.30mm/px · 6 of 24 frames shown]
[frame 3/24]
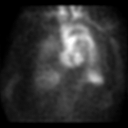
[frame 7/24]
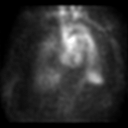
[frame 11/24]
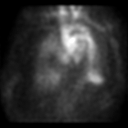
[frame 15/24]
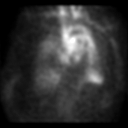
[frame 19/24]
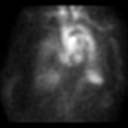
[frame 23/24]
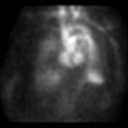

[Series 1000: lao 45 · 3.30mm/px · 1 of 1 slices shown]
[im 1/1]
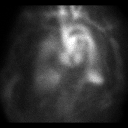

[Series 1000: lao 45-gated · 3.30mm/px · 6 of 24 frames shown]
[frame 3/24]
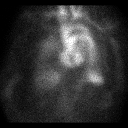
[frame 7/24]
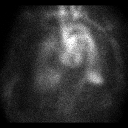
[frame 11/24]
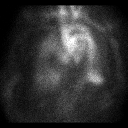
[frame 15/24]
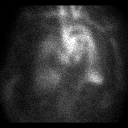
[frame 19/24]
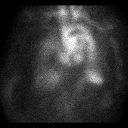
[frame 23/24]
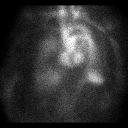

[Series 1000: lao 70-gated · 3.30mm/px · 6 of 24 frames shown]
[frame 3/24]
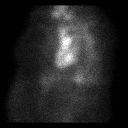
[frame 7/24]
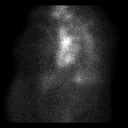
[frame 11/24]
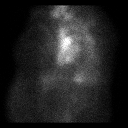
[frame 15/24]
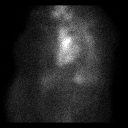
[frame 19/24]
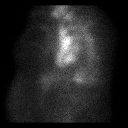
[frame 23/24]
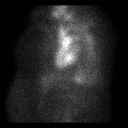

[27 of 27 positions shown; findings below may reference images not displayed]

FINDINGS: The cine images demonstrate normal left ventricular wall motion. No
akinetic or dyskinetic segments are identified. The patient's
ejection fraction was calculated at 68%.
IMPRESSION: Normal left ventricular wall motion with ejection fraction of 68%.

## 2016-10-20 IMAGING — CR DG CHEST 2V
1 series · 2 of 2 positions shown · non-contrast
Comparison: Chest x-ray dated [DATE].

CLINICAL DATA: Pre-op CXR for port placement scheduled for [DATE].
No current chest complaints. Hx of right breast cancer, COPD, HTN.
Fomer smoker.

EXAM:
CHEST  2 VIEW

[Series 1: dg chest 2 view · 0.14mm/px · 2 of 2 slices shown]
[im 1/2]
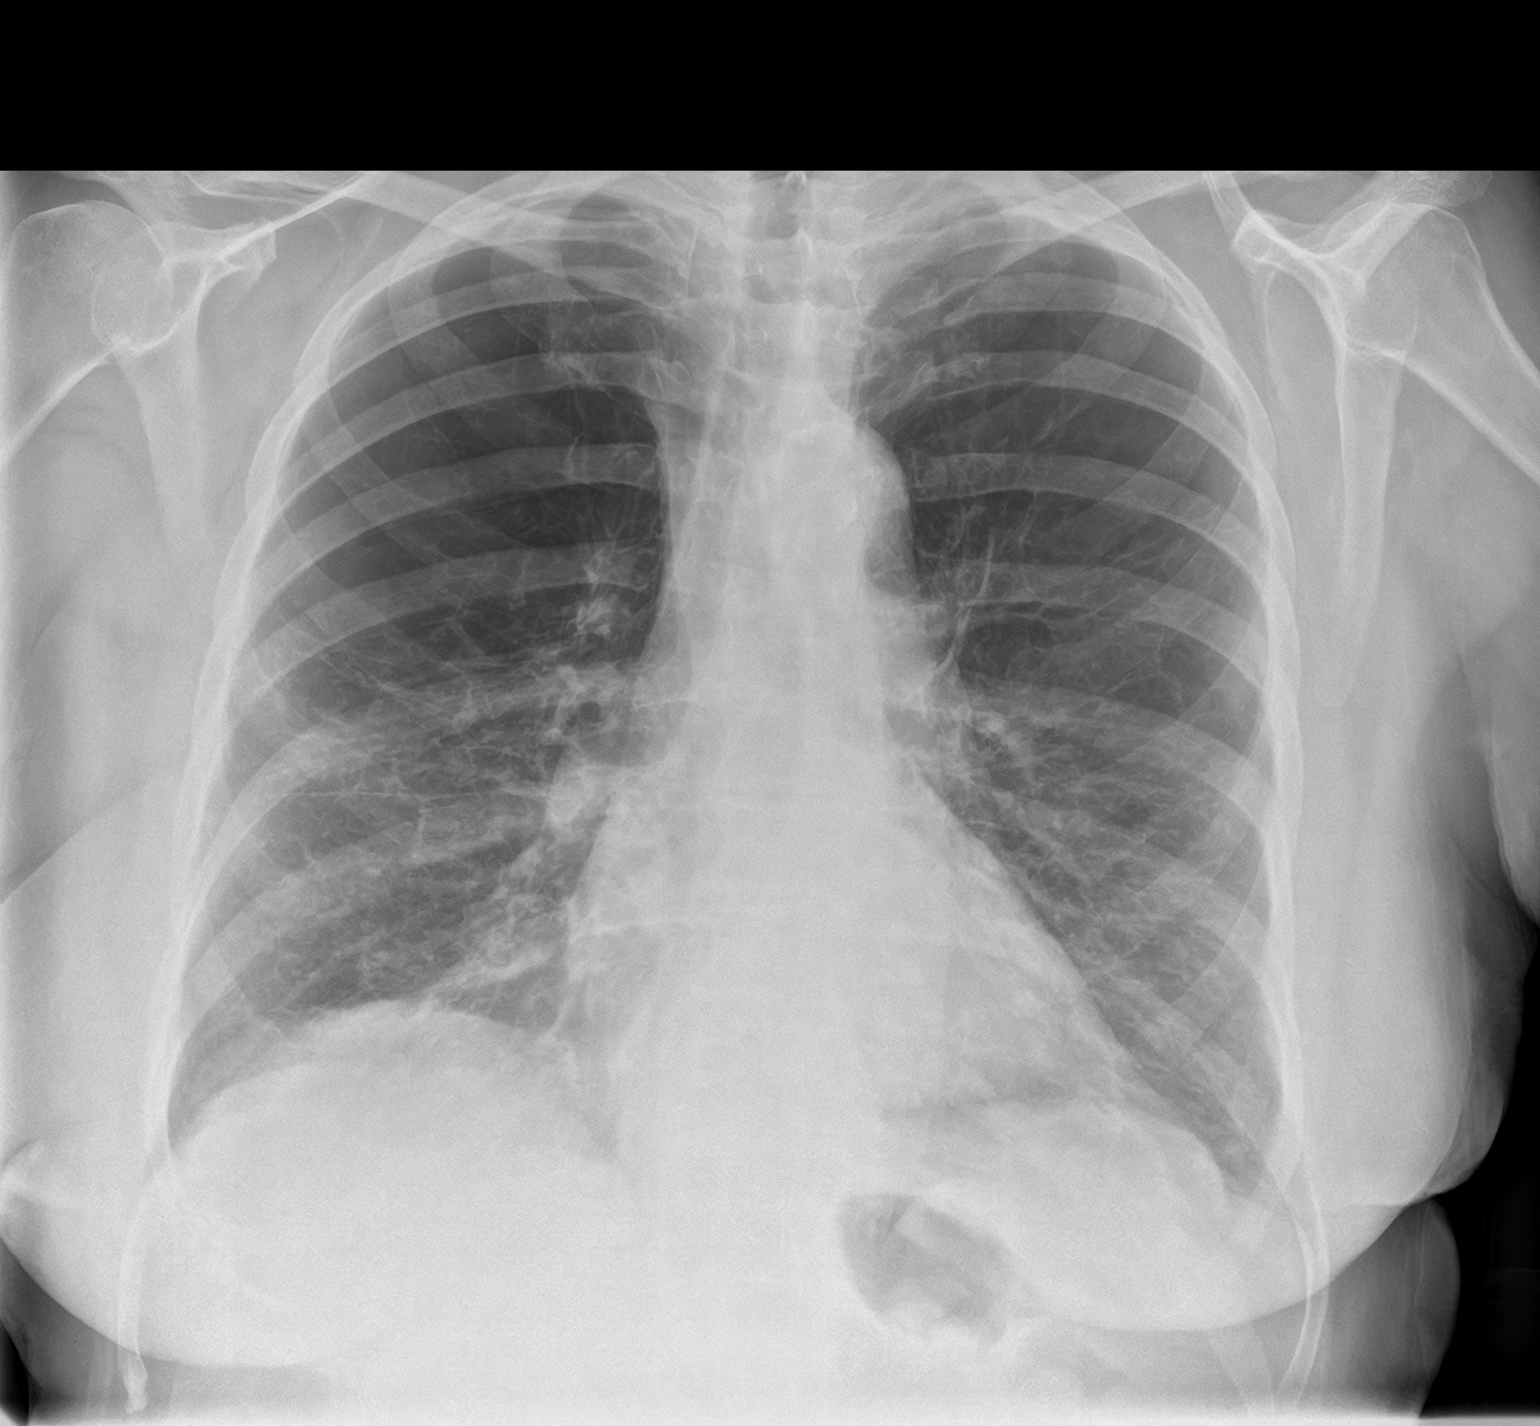
[im 2/2]
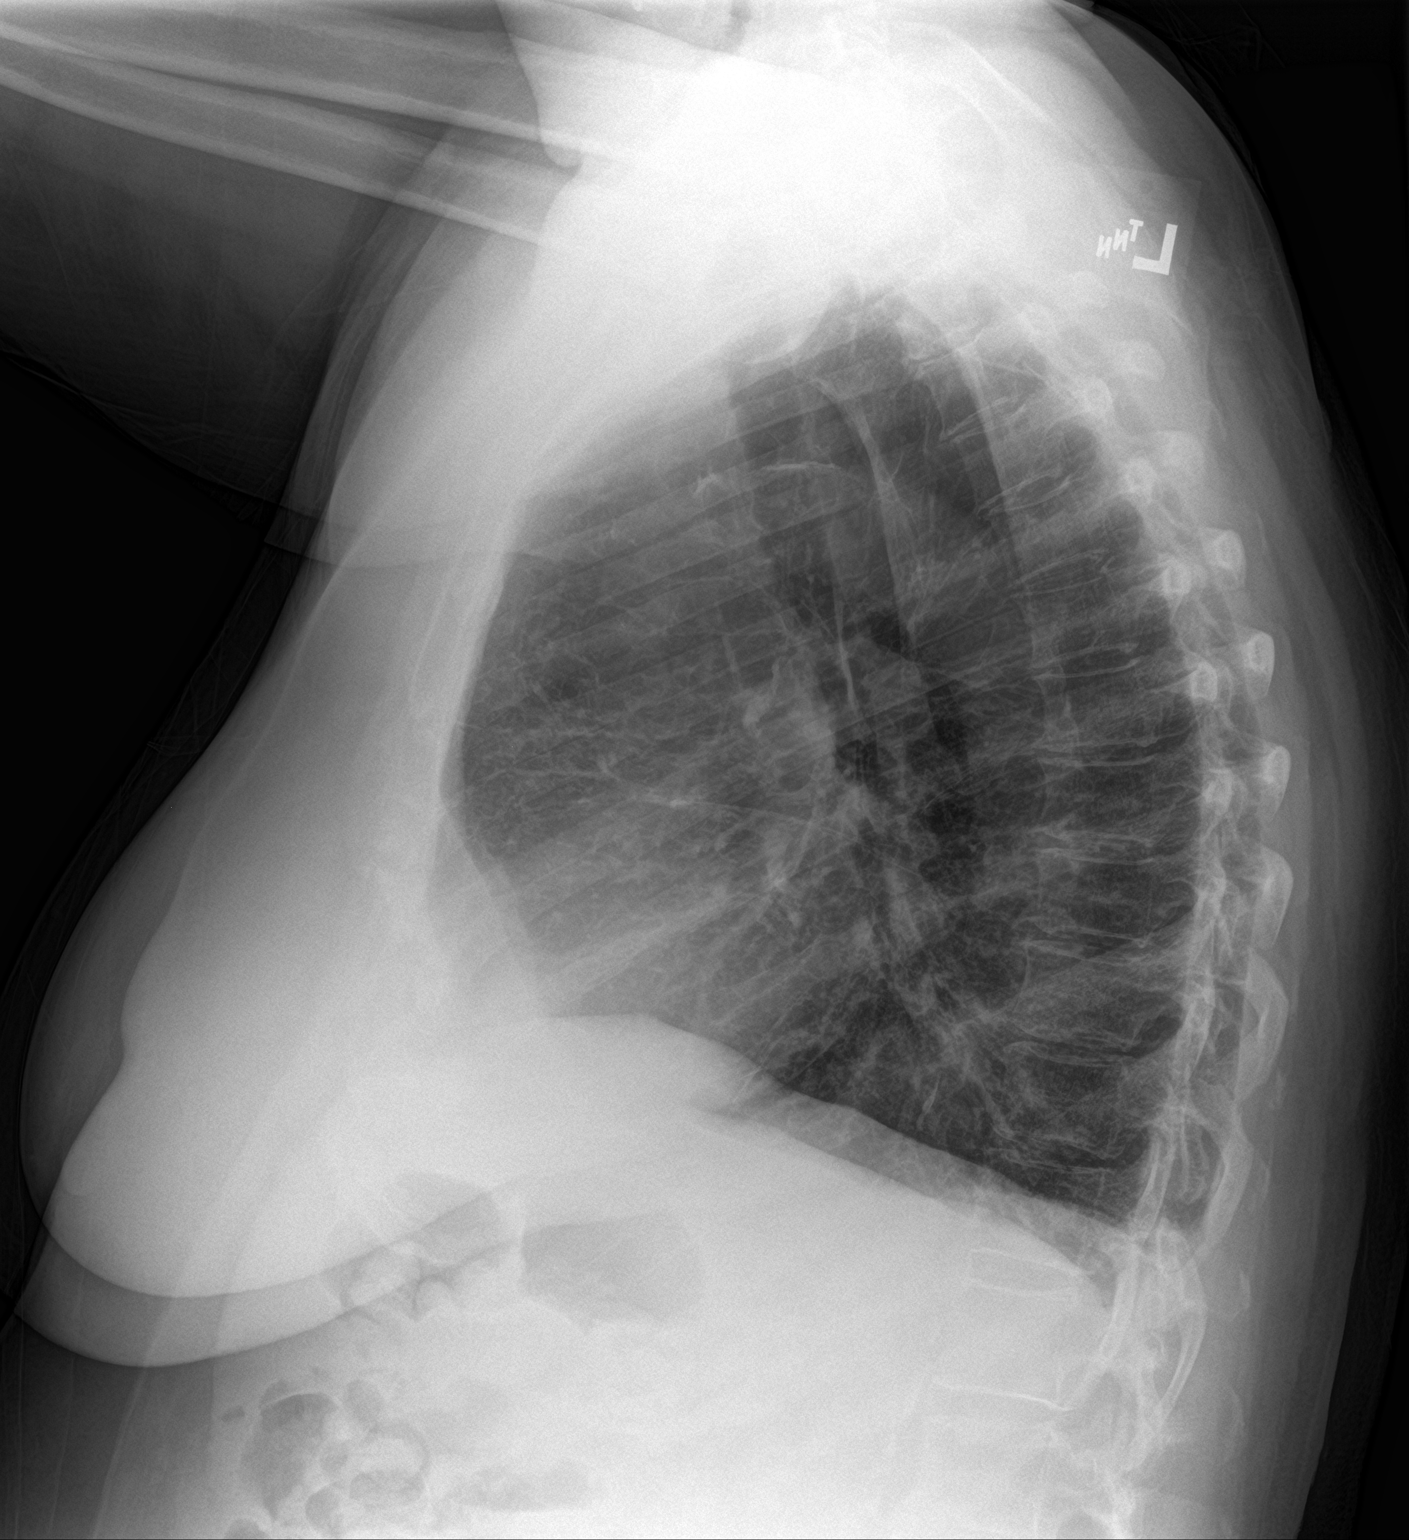

[2 of 2 positions shown; findings below may reference images not displayed]

FINDINGS: Heart size and mediastinal contours are normal. Atherosclerotic
change noted at the aortic arch.

Chronic scarring/ fibrosis again noted at each lung base, stable.
Emphysematous bulla again noted within the upper lungs, stable. No
new opacity to suggest a developing pneumonia. No pleural effusion
or pneumothorax seen. No acute or suspicious osseous finding.
IMPRESSION: 1. No acute findings.  No evidence of pneumonia or pulmonary edema.
2. Emphysema.
3. Aortic atherosclerosis.

## 2016-10-20 MED ORDER — LIDOCAINE-PRILOCAINE 2.5-2.5 % EX CREA: TOPICAL_CREAM | CUTANEOUS | 3 refills | Status: DC

## 2016-10-20 MED ORDER — ONDANSETRON HCL 8 MG PO TABS: 8.0000 mg | ORAL_TABLET | Freq: Two times a day (BID) | ORAL | 2 refills | Status: DC | PRN

## 2016-10-20 MED ORDER — PROCHLORPERAZINE MALEATE 10 MG PO TABS: 10.0000 mg | ORAL_TABLET | Freq: Four times a day (QID) | ORAL | 2 refills | Status: DC | PRN

## 2016-10-20 MED ORDER — TECHNETIUM TC 99M-LABELED RED BLOOD CELLS IV KIT
20.0000 | PACK | Freq: Once | INTRAVENOUS | Status: AC | PRN
  Administered 2016-10-20: 21.21 via INTRAVENOUS

## 2016-10-20 NOTE — Progress Notes (Signed)
Patient ID: Krista Cisneros, female   DOB: [birth year], 76 y.o.   MRN: [id number]  Chief Complaint  Patient presents with  . New Patient (Initial Visit)  . Pre-op Exam    Port Placement    Referred By Dr. Grayland Ormond Reason for Referral Insertion of Port A Cath  HPI Location, Quality, Duration, Severity, Timing, Context, Modifying Factors, Associated Signs and Symptoms.  Krista Cisneros is a 76 y.o. female.  She had her annual mammogram done and was found to have a mass in the right breast. 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 She's undergone a complete staging evaluation and requires a Port-A-Cath insertion for chemotherapy. She states that she is scheduled to begin her chemotherapy on July 10. The plan is for her to get chemotherapy followed by radiation therapy and subsequent lumpectomy and axillary node biopsy. She has no prior history of lung disease. She has no prior history of malignancy. Her only other surgery has been a hysterectomy in her early adult life. She is not a smoker. She's had no cardiovascular problems over the years.   Past Medical History:  Diagnosis Date  . COPD (chronic obstructive pulmonary disease) ([place])   . Hypertension     Past Surgical History:  Procedure Laterality Date  . ABDOMINAL HYSTERECTOMY  1990   Partial  . BREAST BIOPSY Right 10/04/2016   axilla lymph node and axillay tail mass biopsy. Path pending    Family History  Problem Relation Age of Onset  . Cancer Mother   . Breast cancer Neg Hx     Social History Social History  Substance Use Topics  . Smoking status: Former Research scientist (life sciences)  . Smokeless tobacco: Never Used  . Alcohol use No    Allergies  Allergen Reactions  . Atorvastatin Other (See Comments)    Muscle Pain  . Gabapentin Swelling  . Penicillins Swelling    Current Outpatient Prescriptions  Medication Sig Dispense Refill  . ADVAIR DISKUS 500-50 MCG/DOSE AEPB     . ALPRAZolam (XANAX)  0.25 MG tablet Take 1 tablet (0.25 mg total) by mouth at bedtime as needed for anxiety. 30 tablet 0  . baclofen (LIORESAL) 10 MG tablet Take by mouth.    . co-enzyme Q-10 30 MG capsule Take 30 mg by mouth daily.    . DULoxetine (CYMBALTA) 60 MG capsule Take 1 capsule (60 mg total) by mouth daily. 30 capsule 0  . ipratropium (ATROVENT HFA) 17 MCG/ACT inhaler Inhale 2 puffs into the lungs 3 (three) times daily.    Marland Kitchen losartan-hydrochlorothiazide (HYZAAR) 100-25 MG tablet     . montelukast (SINGULAIR) 10 MG tablet     . Omega-3 Fatty Acids (FISH OIL PO) Take 1 capsule by mouth 2 times daily at 12 noon and 4 pm.    . omeprazole (PRILOSEC) 20 MG capsule Take 20 mg by mouth daily.    . Potassium Chloride ER 20 MEQ TBCR      No current facility-administered medications for this visit.       Review of Systems A complete review of systems was asked and was negative except for the following positive findingsRecent weight loss, cough, shortness of breath, headaches, depression.  Blood pressure (!) 174/92, pulse 86, temperature 98.1 F (36.7 C), temperature source Oral, resp. rate 18, height 5\' 2"  (1.575 m), weight 182 lb 12.8 oz (82.9 kg), SpO2 98 %.  Physical Exam CONSTITUTIONAL:  Pleasant, well-developed, well-nourished, and in no acute distress. EYES: Pupils equal and reactive  to light, Sclera non-icteric EARS, NOSE, MOUTH AND THROAT:  The oropharynx was clear.  Dentition is good repair.  Oral mucosa pink and moist. LYMPH NODES:  Lymph nodes in the neck and axillae were normal RESPIRATORY:  Lungs were clear.  Normal respiratory effort without pathologic use of accessory muscles of respiration.  There were no palpable masses present within the right breast CARDIOVASCULAR: Heart was regular without murmurs.  There were no carotid bruits. GI: The abdomen was soft, nontender, and nondistended. There were no palpable masses. There was no hepatosplenomegaly. There were normal bowel sounds in all  quadrants. GU:  Rectal deferred.   MUSCULOSKELETAL:  Normal muscle strength and tone.  No clubbing or cyanosis.   SKIN:  There were no pathologic skin lesions.  There were no nodules on palpation. NEUROLOGIC:  Sensation is normal.  Cranial nerves are grossly intact. PSYCH:  Oriented to person, place and time.  Mood and affect are normal.  Data Reviewed   I have personally reviewed the patient's imaging, laboratory findings and medical records.    Assessment    Right-sided breast carcinoma with a need for Port-A-Cath insertion    Plan    I explained the patient indications and risks of Port-A-Cath insertion. Risks of bleeding, infection, pneumothorax and death were reviewed. I also discussed with her the possibility of removal once her chemotherapy is complete. All her questions were answered. She will stop her aspirin.       [name], MD 10/20/2016, 11:55 AM

## 2016-10-20 NOTE — Progress Notes (Signed)
START ON PATHWAY REGIMEN - Breast     A cycle is every 21 days:     Pertuzumab      Pertuzumab      Trastuzumab      Trastuzumab      Carboplatin      Docetaxel   **Always confirm dose/schedule in your pharmacy ordering system**    Patient Characteristics: Preoperative or Nonsurgical Candidate (Clinical Staging), Neoadjuvant Therapy followed by Surgery, Invasive Disease, Chemotherapy, HER2 Positive, ER Negative/Unknown Therapeutic Status: Preoperative or Nonsurgical Candidate (Clinical Staging) AJCC M Category: cM0 AJCC Grade: G3 Breast Surgical Plan: Neoadjuvant Therapy followed by Surgery ER Status: Negative (-) AJCC 8 Stage Grouping: IIB HER2 Status: Positive (+) AJCC T Category: cT2 AJCC N Category: cN1 PR Status: Positive (+) Intent of Therapy: Curative Intent, Discussed with Patient

## 2016-10-20 NOTE — Patient Instructions (Signed)
We have seen you today and have spoken about your port placement. This has been scheduled for 10/24/16 at [facility].  Please see the [facility]) Sheet provided for further details.  Please call our office with any questions or concerns that you have.  Please stop taking your Aspirin today.      [employer], Adult A central line is a soft, flexible tube (catheter) that can be used to collect blood for testing or to give medicine or nutrition through a vein. The tip of the central line ends in a large vein just above the heart called the vena cava. A central line may be placed because:  You need to get medicines or fluids through an IV tube for a long period of time.  You need nutrition but cannot eat or absorb nutrients.  The veins in your hands or arms are hard to access.  You need to have blood taken often for blood tests.  You need a blood transfusion  You need chemotherapy or dialysis.  There are many types of central lines:  Peripherally inserted central catheter (PICC) line. This type is used for intermediate access to long-term access of one week or more. It can be used to draw blood and give fluids or medicines. A PICC looks like an IV tube, but it goes up the arm to the heart. It is usually inserted in the upper arm and taped in place on the arm.  Tunneled central line. This type is used for long-term therapy and dialysis. It is placed in a large vein in the neck, chest, or groin. A tunneled central line is inserted through a small incision made over the vein and is advanced into the heart. It is tunneled beneath the skin and brought out through a second incision.  Non-tunneled central line. This type is used for short-term access, usually of a maximum of 7 days. It is often used in the emergency department. A non-tunneled central line is inserted in the neck, chest, or groin.  Implanted port. This type is used for long-term therapy. It can stay in place longer than other  types of central lines. An implanted port is normally inserted in the upper chest but can also be placed in the upper arm or in the abdomen. It is inserted and removed with surgery, and it is accessed using a special needle.  The type of central line that you receive depends on how long you will need it, your medical condition, and the condition of your veins. What are the risks? Using any type of central line has risks that you should be aware of, including:  Infection.  A blood clot that blocks the central line or forms in the vein and travels to the heart.  Bleeding from the place where the central line was put in.  Developing a hole or crack within the central line. If this happens, the central line will need to be replaced.  Developing an abnormal heart rhythm (arrhythmia). This is rare.  Central line failure.  Follow these instructions at home: Flushing and cleaning the central line  Follow instructions from the health care provider about flushing and cleaning the central line.  Wear a mask when flushing or cleaning the central line.  Before you flush or clean the central line: ? Wash your hands with soap and water. ? Clean the central line hub with rubbing alcohol. Insertion site care  Keep the insertion site of your central line clean and dry at all  times.  Check your incision or central line site every day for signs of infection. Check for: ? More redness, swelling, or pain. ? More fluid or blood. ? Warmth. ? Pus or a bad smell. General instructions  Follow instructions from your health care provider for the type of device that you have.  If the central line accidentally gets pulled on, make sure: ? The bandage (dressing) is okay. ? There is no bleeding. ? The line has not been pulled out.  Return to your normal activities as told by your health care provider. Ask your health care provider what activities are safe for you. You may be restricted from lifting or  making repetitive arm movements on the side with the catheter.  Do not swim or bathe unless your health care provider approves.  Keep your dressing dry. Your health care provider can instruct you about how to keep your specific type of dressing from getting wet.  Keep all follow-up visits as told by your health care provider. This is important. Contact a health care provider if:  You have more redness, swelling, or pain around your incision.  You have more fluid or blood coming from your incision.  Your incision feels warm to the touch.  You have pus or a bad smell coming from your incision. Get help right away if:  You have: ? Chills. ? A fever. ? Shortness of breath. ? Trouble breathing. ? Chest pain. ? Swelling in your neck, face, chest, or arm on the side of your central line.  You are coughing.  You feel your heart beating rapidly or skipping beats.  You feel dizzy or you faint.  Your incision or central line site has red streaks spreading away from the area.  Your incision or central line site is bleeding and does not stop.  Your central line is difficult to flush or will not flush.  You do not get a blood return from the central line.  Your central line gets loose or comes out.  Your central line gets damaged.  Your catheter leaks when flushed or when fluids are infused into it. This information is not intended to replace advice given to you by your health care provider. Make sure you discuss any questions you have with your health care provider. Document Released: 06/01/2005 Document Revised: 12/08/2015 Document Reviewed: 11/17/2015 Elsevier Interactive Patient Education  2017 [employer].

## 2016-10-20 NOTE — Patient Instructions (Signed)
Your procedure is scheduled on: October 24, 2016 [facility]) Report to Same Day Surgery 2nd floor medical mall ([place] Entrance-take elevator on left to 2nd floor.  Check in with surgery information desk.) To find out your arrival time please call [phone] between 1PM - 3PM on October 23, 2016 (MONDAY)    Remember: Instructions that are not followed completely may result in serious medical risk, up to and including death, or upon the discretion of your surgeon and anesthesiologist your surgery may need to be rescheduled.    _x___ 1. Do not eat food or drink liquids after midnight. No gum chewing or hard candies                                   __x__ 2. No Alcohol for 24 hours before or after surgery.   __x__3. No Smoking for 24 prior to surgery.   ____  4. Bring all medications with you on the day of surgery if instructed.    __x__ 5. Notify your doctor if there is any change in your medical condition     (cold, fever, infections).     Do not wear jewelry, make-up, hairpins, clips or nail polish.  Do not wear lotions, powders, or perfumes.   Do not shave 48 hours prior to surgery. Men may shave face and neck.  Do not bring valuables to the hospital.    [facility] is not responsible for any belongings or valuables.               Contacts, dentures or bridgework may not be worn into surgery.  Leave your suitcase in the car. After surgery it may be brought to your room.  For patients admitted to the hospital, discharge time is determined by your treatment team                       Patients discharged the day of surgery will not be allowed to drive home.  You will need someone to drive you home and stay with you the night of your procedure.    Please read over the following fact sheets that you were given:   [facility] Preparing for Surgery and or MRSA Information   _x___ Take the following medications with a sip of water the morning of surgery :   1.CYMBALTA  2.SINGULAIR.  3.  PRILOSEC (PRILOSEC AT BEDTIME ON MONDAY NIGHT PRIOR TO SURGERY )    ____Fleets enema or Magnesium Citrate as directed.   _x___ Use CHG Soap or sage wipes as directed on instruction sheet   _x___ Use inhalers on the day of surgery and bring to hospital day of surgery (USE ADVAIR AND [place] )  ____ Stop Metformin and Janumet 2 days prior to surgery.    ____ Take 1/2 of usual insulin dose the night before surgery and none on the morning surgery  _x___ Follow recommendations from Cardiologist, Pulmonologist or PCP regarding          stopping Aspirin, Coumadin, Plavix ,Eliquis, Effient, or Pradaxa, and Pletal. (PATIENT STOPPED ASPIRIN TODAY PER DR OAKS INSTRUCTIONS PER PATIENT )  X____Stop Anti-inflammatories such as Advil, Aleve, Ibuprofen, Motrin, Naproxen, Naprosyn, Goodies powders or aspirin products. OK to take Tylenol   _x___ Stop supplements until after surgery.  But may continue Vitamin D, Vitamin B, and  multivitamin . (STOP FISH OIL AND CO Q 10 NOW)        ____ Bring C-Pap to the hospital.

## 2016-10-23 ENCOUNTER — Encounter: Payer: Self-pay | Admitting: *Deleted

## 2016-10-23 ENCOUNTER — Telehealth: Payer: Self-pay | Admitting: Cardiothoracic Surgery

## 2016-10-23 MED ORDER — VANCOMYCIN HCL IN DEXTROSE 1-5 GM/200ML-% IV SOLN
1000.0000 mg | INTRAVENOUS | Status: AC
  Administered 2016-10-24: 1000 mg via INTRAVENOUS

## 2016-10-23 NOTE — Pre-Procedure Instructions (Signed)
AS INSTRUCTED BY DR P CARROLL, EKG CALLED AND FAXED TO DR Iona Beard. SPOKE WITH ASHLEY

## 2016-10-23 NOTE — Telephone Encounter (Signed)
Pt advised of pre op date/time and sx date. Sx: 10/24/16 with Dr Rolley Sims Placement Pre op: 10/20/16 @ 12:00pm--office.   Patient made aware to call [phone], between 1-3:00pm the day before surgery, to find out what time to arrive.

## 2016-10-23 NOTE — Pre-Procedure Instructions (Signed)
Dr. Hoy Morn cleared patient for upcoming surgery ( insertion of port-a-cath).

## 2016-10-24 ENCOUNTER — Inpatient Hospital Stay: Payer: Medicare HMO

## 2016-10-24 ENCOUNTER — Ambulatory Visit: Payer: Medicare HMO | Admitting: Anesthesiology

## 2016-10-24 ENCOUNTER — Ambulatory Visit
Admission: RE | Admit: 2016-10-24 | Discharge: 2016-10-24 | Disposition: A | Discharge status: Home / Self Care | Payer: Medicare HMO | Source: Ambulatory Visit | Attending: Cardiothoracic Surgery | Admitting: Cardiothoracic Surgery

## 2016-10-24 ENCOUNTER — Ambulatory Visit: Payer: Medicare HMO

## 2016-10-24 ENCOUNTER — Encounter: Payer: Self-pay | Admitting: *Deleted

## 2016-10-24 ENCOUNTER — Encounter
Admission: RE | Disposition: A | Discharge status: Home / Self Care | Payer: Self-pay | Source: Ambulatory Visit | Attending: Cardiothoracic Surgery

## 2016-10-24 DIAGNOSIS — F419 Anxiety disorder, unspecified: Secondary | ICD-10-CM | POA: Diagnosis not present

## 2016-10-24 DIAGNOSIS — I10 Essential (primary) hypertension: Secondary | ICD-10-CM | POA: Insufficient documentation

## 2016-10-24 DIAGNOSIS — C50011 Malignant neoplasm of nipple and areola, right female breast: Secondary | ICD-10-CM | POA: Diagnosis not present

## 2016-10-24 DIAGNOSIS — M1990 Unspecified osteoarthritis, unspecified site: Secondary | ICD-10-CM | POA: Insufficient documentation

## 2016-10-24 DIAGNOSIS — K219 Gastro-esophageal reflux disease without esophagitis: Secondary | ICD-10-CM | POA: Insufficient documentation

## 2016-10-24 DIAGNOSIS — I739 Peripheral vascular disease, unspecified: Secondary | ICD-10-CM | POA: Insufficient documentation

## 2016-10-24 DIAGNOSIS — F329 Major depressive disorder, single episode, unspecified: Secondary | ICD-10-CM | POA: Diagnosis not present

## 2016-10-24 DIAGNOSIS — J449 Chronic obstructive pulmonary disease, unspecified: Secondary | ICD-10-CM | POA: Diagnosis not present

## 2016-10-24 DIAGNOSIS — Z87891 Personal history of nicotine dependence: Secondary | ICD-10-CM | POA: Insufficient documentation

## 2016-10-24 DIAGNOSIS — Z888 Allergy status to other drugs, medicaments and biological substances status: Secondary | ICD-10-CM | POA: Diagnosis not present

## 2016-10-24 DIAGNOSIS — C50911 Malignant neoplasm of unspecified site of right female breast: Secondary | ICD-10-CM | POA: Insufficient documentation

## 2016-10-24 DIAGNOSIS — Z79899 Other long term (current) drug therapy: Secondary | ICD-10-CM | POA: Insufficient documentation

## 2016-10-24 DIAGNOSIS — Z09 Encounter for follow-up examination after completed treatment for conditions other than malignant neoplasm: Secondary | ICD-10-CM

## 2016-10-24 DIAGNOSIS — Z880 Allergy status to penicillin: Secondary | ICD-10-CM | POA: Insufficient documentation

## 2016-10-24 HISTORY — PX: PORTACATH PLACEMENT: SHX2246

## 2016-10-24 IMAGING — DX DG CHEST 1V PORT
1 series · 1 of 1 positions shown · non-contrast
Comparison: [DATE]; [DATE]

CLINICAL DATA: Post Port a catheter insertion. History of COPD and
hypertension. Former smoker.

EXAM:
PORTABLE CHEST 1 VIEW

[chest ap]
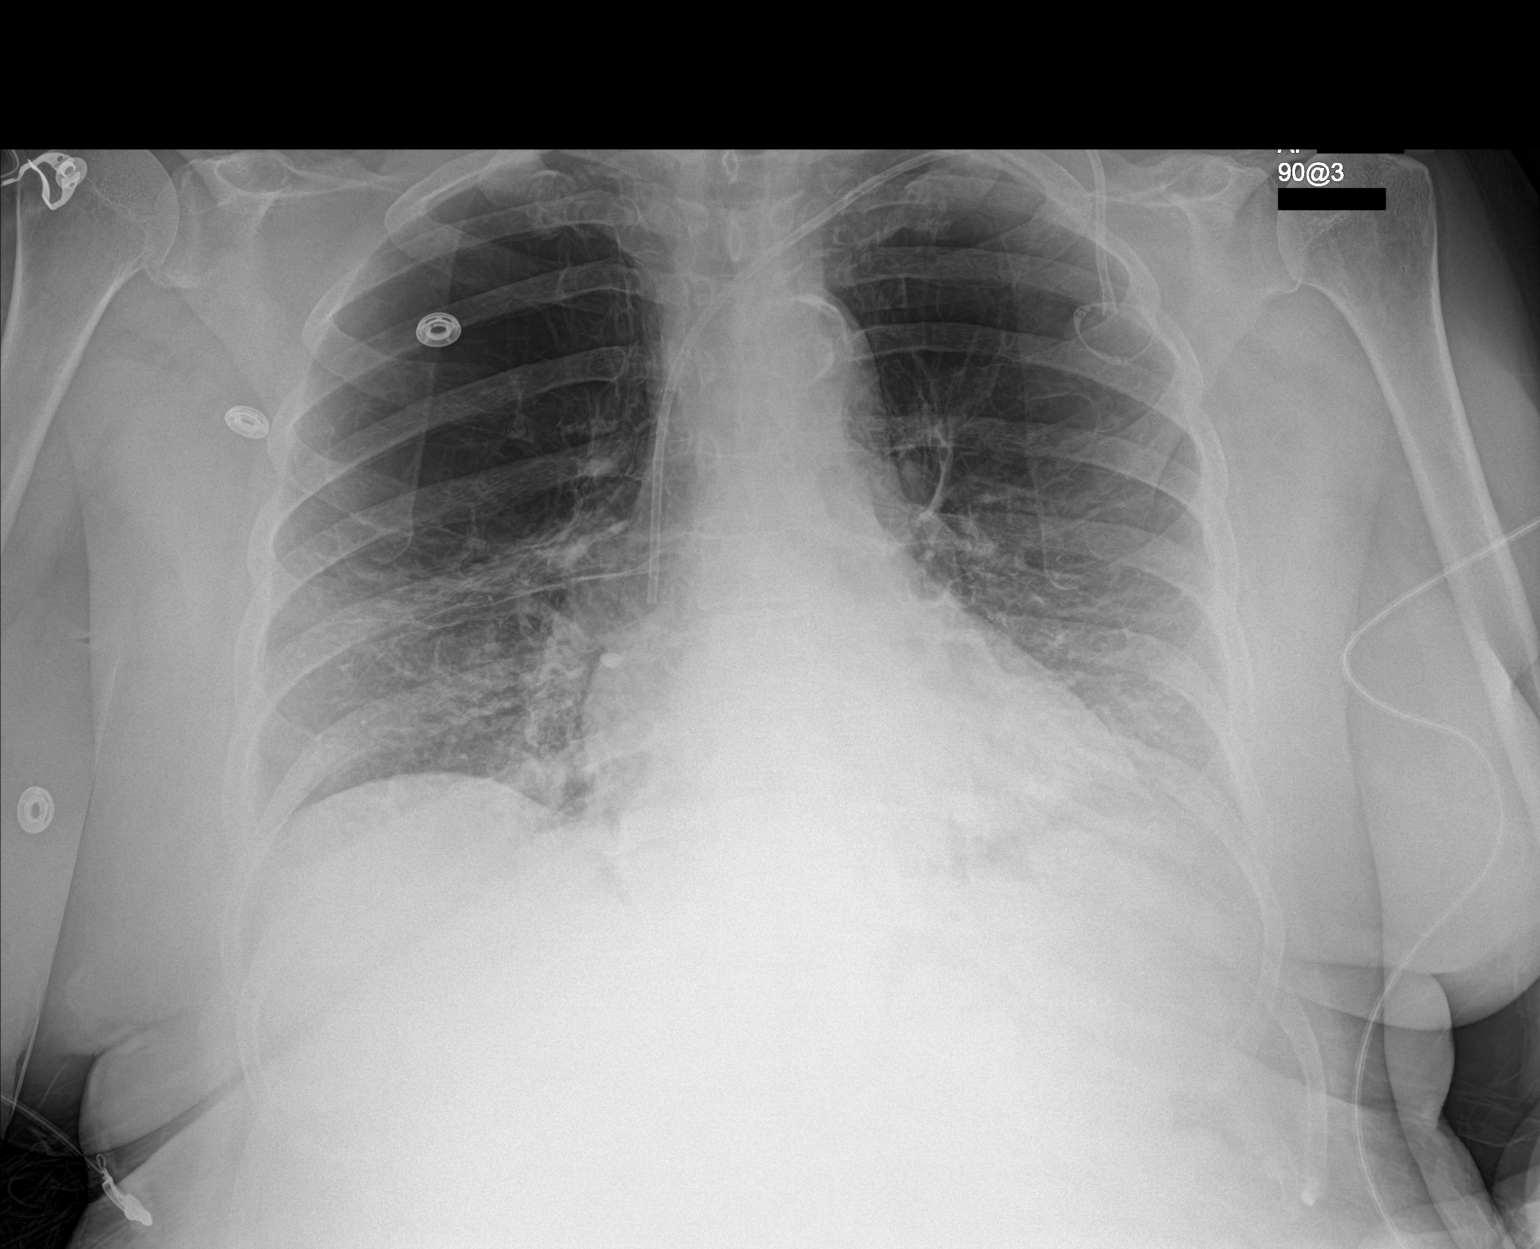

[1 of 1 positions shown; findings below may reference images not displayed]

FINDINGS: Grossly unchanged cardiac silhouette and mediastinal contours with
atherosclerotic plaque within thoracic aorta. Interval placement of
left anterior chest wall subclavian vein approach port a catheter
with tip projected over the superior cavoatrial junction.

The lungs remain hyperexpanded with flattening of the diaphragms and
thinning of the biapical pulmonary parenchyma. Slight worsening of
bilateral mid and lower lung heterogeneous opacities favored to
represent atelectasis. No definite pleural effusion. No evidence of
edema. No acute osseus abnormalities.
IMPRESSION: 1. Uncomplicated placement of left subclavian vein approach port a
catheter with tip projected over the superior cavoatrial junction.
Specifically, no evidence of pneumothorax.
2. Worsening bibasilar atelectasis without superimposed acute
cardiopulmonary disease.
3. Aortic Atherosclerosis ([22]-[22]) and Emphysema ([22]-[22]).

## 2016-10-24 SURGERY — INSERTION, TUNNELED CENTRAL VENOUS DEVICE, WITH PORT
Anesthesia: General | Laterality: Left | Wound class: Clean

## 2016-10-24 MED ORDER — OXYCODONE HCL 5 MG PO TABS: 5.0000 mg | ORAL_TABLET | Freq: Once | ORAL | 0 refills | Status: DC | PRN

## 2016-10-24 MED ORDER — ONDANSETRON HCL 4 MG/2ML IJ SOLN
INTRAMUSCULAR | Status: DC | PRN
  Administered 2016-10-24: 4 mg via INTRAVENOUS

## 2016-10-24 MED ORDER — LIDOCAINE HCL (PF) 1 % IJ SOLN
INTRAMUSCULAR | Status: AC
  Filled 2016-10-24: qty 30

## 2016-10-24 MED ORDER — DEXAMETHASONE SODIUM PHOSPHATE 10 MG/ML IJ SOLN
INTRAMUSCULAR | Status: DC | PRN
  Administered 2016-10-24: 8 mg via INTRAVENOUS

## 2016-10-24 MED ORDER — PHENYLEPHRINE HCL 10 MG/ML IJ SOLN
INTRAMUSCULAR | Status: DC | PRN
  Administered 2016-10-24 (×5): 100 ug via INTRAVENOUS

## 2016-10-24 MED ORDER — EPHEDRINE SULFATE 50 MG/ML IJ SOLN
INTRAMUSCULAR | Status: DC | PRN
  Administered 2016-10-24: 10 mg via INTRAVENOUS
  Administered 2016-10-24: 5 mg via INTRAVENOUS

## 2016-10-24 MED ORDER — MIDAZOLAM HCL 2 MG/2ML IJ SOLN
INTRAMUSCULAR | Status: DC | PRN
  Administered 2016-10-24: 1 mg via INTRAVENOUS

## 2016-10-24 MED ORDER — LACTATED RINGERS IV SOLN
INTRAVENOUS | Status: DC
  Administered 2016-10-24: 08:00:00 via INTRAVENOUS

## 2016-10-24 MED ORDER — OXYCODONE HCL 5 MG PO TABS: 5.0000 mg | ORAL_TABLET | Freq: Once | ORAL | Status: DC | PRN

## 2016-10-24 MED ORDER — LIDOCAINE HCL 1 % IJ SOLN
INTRAMUSCULAR | Status: DC | PRN
  Administered 2016-10-24: 18 mL

## 2016-10-24 MED ORDER — FENTANYL CITRATE (PF) 100 MCG/2ML IJ SOLN
INTRAMUSCULAR | Status: DC | PRN
  Administered 2016-10-24: 50 ug via INTRAVENOUS
  Administered 2016-10-24: 25 ug via INTRAVENOUS

## 2016-10-24 MED ORDER — GLYCOPYRROLATE 0.2 MG/ML IJ SOLN
INTRAMUSCULAR | Status: DC | PRN
  Administered 2016-10-24: .2 mg via INTRAVENOUS

## 2016-10-24 MED ORDER — LIDOCAINE HCL (CARDIAC) 20 MG/ML IV SOLN
INTRAVENOUS | Status: DC | PRN
  Administered 2016-10-24: 40 mg via INTRAVENOUS

## 2016-10-24 MED ORDER — HEPARIN SODIUM (PORCINE) 5000 UNIT/ML IJ SOLN
INTRAMUSCULAR | Status: AC
  Filled 2016-10-24: qty 1

## 2016-10-24 MED ORDER — FENTANYL CITRATE (PF) 100 MCG/2ML IJ SOLN: 25.0000 ug | INTRAMUSCULAR | Status: DC | PRN

## 2016-10-24 MED ORDER — PROPOFOL 10 MG/ML IV BOLUS
INTRAVENOUS | Status: DC | PRN
  Administered 2016-10-24: 30 mg via INTRAVENOUS
  Administered 2016-10-24: 120 mg via INTRAVENOUS

## 2016-10-24 MED ORDER — OXYCODONE HCL 5 MG/5ML PO SOLN: 5.0000 mg | Freq: Once | ORAL | Status: DC | PRN

## 2016-10-24 SURGICAL SUPPLY — 38 items
BAG DECANTER FOR FLEXI CONT (MISCELLANEOUS) ×2 IMPLANT
BLADE SURG SZ11 CARB STEEL (BLADE) ×2 IMPLANT
CANISTER SUCT 1200ML W/VALVE (MISCELLANEOUS) ×2 IMPLANT
CHLORAPREP W/TINT 26ML (MISCELLANEOUS) ×2 IMPLANT
COVER LIGHT HANDLE STERIS (MISCELLANEOUS) IMPLANT
DRAPE C-ARM XRAY 36X54 (DRAPES) ×2 IMPLANT
DRSG TEGADERM 2-3/8X2-3/4 SM (GAUZE/BANDAGES/DRESSINGS) ×2 IMPLANT
DRSG TEGADERM 4X4.75 (GAUZE/BANDAGES/DRESSINGS) ×2 IMPLANT
DRSG TELFA 4X3 1S NADH ST (GAUZE/BANDAGES/DRESSINGS) ×2 IMPLANT
ELECT CAUTERY BLADE TIP 2.5 (TIP) ×2
ELECT REM PT RETURN 9FT ADLT (ELECTROSURGICAL) ×2
ELECTRODE CAUTERY BLDE TIP 2.5 (TIP) ×1 IMPLANT
ELECTRODE REM PT RTRN 9FT ADLT (ELECTROSURGICAL) ×1 IMPLANT
GLOVE SURG SYN 7.5  E (GLOVE) ×1
GLOVE SURG SYN 7.5 E (GLOVE) ×1 IMPLANT
GOWN STRL REUS W/ TWL LRG LVL3 (GOWN DISPOSABLE) ×2 IMPLANT
GOWN STRL REUS W/TWL LRG LVL3 (GOWN DISPOSABLE) ×2
IV NS 500ML (IV SOLUTION) ×1
IV NS 500ML BAXH (IV SOLUTION) ×1 IMPLANT
KIT PORT POWER 8FR ISP CVUE (Catheter) ×2 IMPLANT
KIT RM TURNOVER STRD PROC AR (KITS) ×2 IMPLANT
LABEL OR SOLS (LABEL) ×2 IMPLANT
MARKER SKIN DUAL TIP RULER LAB (MISCELLANEOUS) ×2 IMPLANT
NDL SAFETY 22GX1.5 (NEEDLE) ×2 IMPLANT
NEEDLE FILTER BLUNT 18X 1/2SAF (NEEDLE) ×1
NEEDLE FILTER BLUNT 18X1 1/2 (NEEDLE) ×1 IMPLANT
NS IRRIG 500ML POUR BTL (IV SOLUTION) IMPLANT
PACK PORT-A-CATH (MISCELLANEOUS) ×2 IMPLANT
PAD TELFA 2X3 NADH STRL (GAUZE/BANDAGES/DRESSINGS) ×2 IMPLANT
SUT ETHILON 4-0 (SUTURE) ×2
SUT ETHILON 4-0 FS2 18XMFL BLK (SUTURE) ×2
SUT PROLENE 2 0 SH DA (SUTURE) ×4 IMPLANT
SUT VIC AB 3-0 SH 27 (SUTURE) ×1
SUT VIC AB 3-0 SH 27X BRD (SUTURE) ×1 IMPLANT
SUTURE ETHLN 4-0 FS2 18XMF BLK (SUTURE) ×2 IMPLANT
SYR 3ML LL SCALE MARK (SYRINGE) ×2 IMPLANT
SYRINGE 10CC LL (SYRINGE) ×2 IMPLANT
TAPE TRANSPORE STRL 2 31045 (GAUZE/BANDAGES/DRESSINGS) ×2 IMPLANT

## 2016-10-24 NOTE — Anesthesia Post-op Follow-up Note (Cosign Needed)
Anesthesia QCDR form completed.        

## 2016-10-24 NOTE — OR Nursing (Signed)
Patient and daughter state that they have never received prescriptions for anti nausea meds or Emla cream.  To date, nothing has been called into their pharmacy. Will follow up with oncology.

## 2016-10-24 NOTE — Anesthesia Postprocedure Evaluation (Signed)
Anesthesia Post Note  Patient: Krista Cisneros  Procedure(s) Performed: Procedure(s) (LRB): INSERTION PORT-A-CATH (Left)  Patient location during evaluation: PACU Anesthesia Type: General Level of consciousness: awake and alert Pain management: pain level controlled Vital Signs Assessment: post-procedure vital signs reviewed and stable Respiratory status: spontaneous breathing, nonlabored ventilation, respiratory function stable and patient connected to nasal cannula oxygen Cardiovascular status: blood pressure returned to baseline and stable Postop Assessment: no signs of nausea or vomiting Anesthetic complications: no     Last Vitals:  Vitals:   10/24/16 0922 10/24/16 0927  BP: (!) 145/82 (!) 168/88  Pulse: 83 84  Resp: 14 15  Temp: 36.4 C (!) 36 C    Last Pain:  Vitals:   10/24/16 0927  TempSrc: Temporal  PainSc: 0-No pain                 Precious Haws Jenille Laszlo

## 2016-10-24 NOTE — Transfer of Care (Addendum)
Immediate Anesthesia Transfer of Care Note  Patient: Krista Cisneros  Procedure(s) Performed: Procedure(s): INSERTION PORT-A-CATH (Left)  Patient Location: PACU  Anesthesia Type:General  Level of Consciousness: sedated  Airway & Oxygen Therapy: Patient Spontanous Breathing  Post-op Assessment: Report given to RN and Post -op Vital signs reviewed and stable  Post vital signs: Reviewed and stable  Last Vitals:  Vitals:   10/24/16 0901 10/24/16 0916  BP: (!) 154/81 (!) 152/77  Pulse: 90 86  Resp: 18 14  Temp:      Last Pain:  Vitals:   10/24/16 0916  TempSrc:   PainSc: 0-No pain         Complications: No apparent anesthesia complications

## 2016-10-24 NOTE — Interval H&P Note (Signed)
History and Physical Interval Note:  10/24/2016 7:06 AM  Krista Cisneros  has presented today for surgery, with the diagnosis of CANCER RIGHT BREAST  The various methods of treatment have been discussed with the patient and family. After consideration of risks, benefits and other options for treatment, the patient has consented to  Procedure(s): INSERTION PORT-A-CATH (N/A) as a surgical intervention .  The patient's history has been reviewed, patient examined, no change in status, stable for surgery.  I have reviewed the patient's chart and labs.  Questions were answered to the patient's satisfaction.     [name]

## 2016-10-24 NOTE — OR Nursing (Signed)
/  Dr. Genevive Bi here to rewrite pain script and to evaluate incision. All cleared to go home.

## 2016-10-24 NOTE — Discharge Instructions (Signed)
AMBULATORY SURGERY  DISCHARGE INSTRUCTIONS   1) The drugs that you were given will stay in your system until tomorrow so for the next 24 hours you should not:  A) Drive an automobile B) Make any legal decisions C) Drink any alcoholic beverage   2) You may resume regular meals tomorrow.  Today it is better to start with liquids and gradually work up to solid foods.  You may eat anything you prefer, but it is better to start with liquids, then soup and crackers, and gradually work up to solid foods.   3) Please notify your doctor immediately if you have any unusual bleeding, trouble breathing, redness and pain at the surgery site, drainage, fever, or pain not relieved by medication.    4) Additional Instructions:  Drink plenty of fluids.  Do not use left arm to carry pocket book or heavy items. Do not use repetitive motion with left arm.  You may remove the dressing on Thursday and shower after that. Pat incision area dry. Do not apply any creams or lotions. May reapply a bandaid if need protection over site. Otherwise, you can leave open to air.    Please contact your physician with any problems or Same Day Surgery at [phone], Monday through Friday 6 am to 4 pm, or [place] at [facility] number at [phone].

## 2016-10-24 NOTE — Anesthesia Preprocedure Evaluation (Signed)
Anesthesia Evaluation  Patient identified by MRN, date of birth, ID band Patient awake    Reviewed: Allergy & Precautions, H&P , NPO status , Patient's Chart, lab work & pertinent test results  History of Anesthesia Complications Negative for: history of anesthetic complications  Airway Mallampati: III  TM Distance: >3 FB Neck ROM: limited    Dental  (+) Poor Dentition, Missing, Upper Dentures, Lower Dentures   Pulmonary neg shortness of breath, COPD,  COPD inhaler, former smoker,           Cardiovascular Exercise Tolerance: Good hypertension, (-) angina+ Peripheral Vascular Disease  (-) Past MI      Neuro/Psych PSYCHIATRIC DISORDERS Anxiety Depression negative neurological ROS     GI/Hepatic Neg liver ROS, GERD  Controlled and Medicated,  Endo/Other  negative endocrine ROS  Renal/GU      Musculoskeletal  (+) Arthritis ,   Abdominal   Peds  Hematology negative hematology ROS (+)   Anesthesia Other Findings Past Medical History: No date: Anxiety No date: Arthritis No date: Cancer [facility])     Comment: Right Breast Cancer No date: COPD (chronic obstructive pulmonary disease) (* No date: Dyspnea     Comment: with exertion No date: GERD (gastroesophageal reflux disease) No date: Hypertension  Past Surgical History: 1990: ABDOMINAL HYSTERECTOMY     Comment: Partial 10/04/2016: BREAST BIOPSY Right     Comment: axilla lymph node and axillay tail mass               biopsy. Path pending No date: DILATION AND CURETTAGE OF UTERUS  BMI    Body Mass Index:  29.26 kg/m      Reproductive/Obstetrics negative OB ROS                             Anesthesia Physical Anesthesia Plan  ASA: III  Anesthesia Plan: General   Post-op Pain Management:    Induction: Intravenous  PONV Risk Score and Plan: 3 and Ondansetron, Dexamethasone, Propofol and Midazolam  Airway Management Planned:  LMA  Additional Equipment:   Intra-op Plan:   Post-operative Plan: Extubation in OR  Informed Consent: I have reviewed the patients History and Physical, chart, labs and discussed the procedure including the risks, benefits and alternatives for the proposed anesthesia with the patient or authorized representative who has indicated his/her understanding and acceptance.   Dental Advisory Given  Plan Discussed with: Anesthesiologist, CRNA and Surgeon  Anesthesia Plan Comments: (Patient consented for risks of anesthesia including but not limited to:  - adverse reactions to medications - damage to teeth, lips or other oral mucosa - sore throat or hoarseness - Damage to heart, brain, lungs or loss of life  Patient voiced understanding.)        Anesthesia Quick Evaluation

## 2016-10-24 NOTE — H&P (View-Only) (Signed)
Patient ID: Krista Cisneros, female   DOB: 21-Jul-1940, 76 y.o.   MRN: [id number]  Chief Complaint  Patient presents with  . New Patient (Initial Visit)  . Pre-op Exam    Port Placement    Referred By Dr. Grayland Ormond Reason for Referral Insertion of Port A Cath  HPI Location, Quality, Duration, Severity, Timing, Context, Modifying Factors, Associated Signs and Symptoms.  Krista Cisneros is a 76 y.o. female.  She had her annual mammogram done and was found to have a mass in the right breast. 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 She's undergone a complete staging evaluation and requires a Port-A-Cath insertion for chemotherapy. She states that she is scheduled to begin her chemotherapy on July 10. The plan is for her to get chemotherapy followed by radiation therapy and subsequent lumpectomy and axillary node biopsy. She has no prior history of lung disease. She has no prior history of malignancy. Her only other surgery has been a hysterectomy in her early adult life. She is not a smoker. She's had no cardiovascular problems over the years.   Past Medical History:  Diagnosis Date  . COPD (chronic obstructive pulmonary disease) ([place])   . Hypertension     Past Surgical History:  Procedure Laterality Date  . ABDOMINAL HYSTERECTOMY  1990   Partial  . BREAST BIOPSY Right 10/04/2016   axilla lymph node and axillay tail mass biopsy. Path pending    Family History  Problem Relation Age of Onset  . Cancer Mother   . Breast cancer Neg Hx     Social History Social History  Substance Use Topics  . Smoking status: Former Research scientist (life sciences)  . Smokeless tobacco: Never Used  . Alcohol use No    Allergies  Allergen Reactions  . Atorvastatin Other (See Comments)    Muscle Pain  . Gabapentin Swelling  . Penicillins Swelling    Current Outpatient Prescriptions  Medication Sig Dispense Refill  . ADVAIR DISKUS 500-50 MCG/DOSE AEPB     . ALPRAZolam (XANAX)  0.25 MG tablet Take 1 tablet (0.25 mg total) by mouth at bedtime as needed for anxiety. 30 tablet 0  . baclofen (LIORESAL) 10 MG tablet Take by mouth.    . co-enzyme Q-10 30 MG capsule Take 30 mg by mouth daily.    . DULoxetine (CYMBALTA) 60 MG capsule Take 1 capsule (60 mg total) by mouth daily. 30 capsule 0  . ipratropium (ATROVENT HFA) 17 MCG/ACT inhaler Inhale 2 puffs into the lungs 3 (three) times daily.    Marland Kitchen losartan-hydrochlorothiazide (HYZAAR) 100-25 MG tablet     . montelukast (SINGULAIR) 10 MG tablet     . Omega-3 Fatty Acids (FISH OIL PO) Take 1 capsule by mouth 2 times daily at 12 noon and 4 pm.    . omeprazole (PRILOSEC) 20 MG capsule Take 20 mg by mouth daily.    . Potassium Chloride ER 20 MEQ TBCR      No current facility-administered medications for this visit.       Review of Systems A complete review of systems was asked and was negative except for the following positive findingsRecent weight loss, cough, shortness of breath, headaches, depression.  Blood pressure (!) 174/92, pulse 86, temperature 98.1 F (36.7 C), temperature source Oral, resp. rate 18, height 5\' 2"  (1.575 m), weight 182 lb 12.8 oz (82.9 kg), SpO2 98 %.  Physical Exam CONSTITUTIONAL:  Pleasant, well-developed, well-nourished, and in no acute distress. EYES: Pupils equal and reactive  to light, Sclera non-icteric EARS, NOSE, MOUTH AND THROAT:  The oropharynx was clear.  Dentition is good repair.  Oral mucosa pink and moist. LYMPH NODES:  Lymph nodes in the neck and axillae were normal RESPIRATORY:  Lungs were clear.  Normal respiratory effort without pathologic use of accessory muscles of respiration.  There were no palpable masses present within the right breast CARDIOVASCULAR: Heart was regular without murmurs.  There were no carotid bruits. GI: The abdomen was soft, nontender, and nondistended. There were no palpable masses. There was no hepatosplenomegaly. There were normal bowel sounds in all  quadrants. GU:  Rectal deferred.   MUSCULOSKELETAL:  Normal muscle strength and tone.  No clubbing or cyanosis.   SKIN:  There were no pathologic skin lesions.  There were no nodules on palpation. NEUROLOGIC:  Sensation is normal.  Cranial nerves are grossly intact. PSYCH:  Oriented to person, place and time.  Mood and affect are normal.  Data Reviewed   I have personally reviewed the patient's imaging, laboratory findings and medical records.    Assessment    Right-sided breast carcinoma with a need for Port-A-Cath insertion    Plan    I explained the patient indications and risks of Port-A-Cath insertion. Risks of bleeding, infection, pneumothorax and death were reviewed. I also discussed with her the possibility of removal once her chemotherapy is complete. All her questions were answered. She will stop her aspirin.       [name], MD 10/20/2016, 11:55 AM

## 2016-10-24 NOTE — Anesthesia Procedure Notes (Signed)
Procedure Name: LMA Insertion Date/Time: 10/24/2016 7:45 AM Performed by: Allean Found Pre-anesthesia Checklist: Patient identified, Emergency Drugs available, Suction available, Patient being monitored and Timeout performed Patient Re-evaluated:Patient Re-evaluated prior to inductionOxygen Delivery Method: Circle system utilized Preoxygenation: Pre-oxygenation with 100% oxygen Intubation Type: IV induction Ventilation: Mask ventilation without difficulty LMA: LMA inserted LMA Size: 4.0 Number of attempts: 1 Placement Confirmation: positive ETCO2 Tube secured with: Tape Dental Injury: Teeth and Oropharynx as per pre-operative assessment

## 2016-10-24 NOTE — Op Note (Signed)
10/24/2016  8:45 AM  PATIENT:  Krista Cisneros  76 y.o. female  PRE-OPERATIVE DIAGNOSIS:  CANCER RIGHT BREAST  POST-OPERATIVE DIAGNOSIS:  CANCER RIGHT BREAST  PROCEDURE:  Procedure(s): INSERTION PORT-A-CATH (Left)  SURGEON:  Surgeon(s) and Role:    * [name], MD - Primary  ASSISTANTS:    ANESTHESIA:     DICTATION:   The patient was brought to the operating suite and placed in the supine position. LMA was placed.  The patient was then prepped and draped in usual sterile fashion. The left subclavian vein was percutaneously catheterized. A wire was placed into the venous system under fluoroscopic guidance. An appropriate site was selected on the chest wall and a Port-A-Cath pocket was created. The catheter was tunneled from the port site up to the insertion site. The catheter was then inserted through a peel-away sheath and positioned at the appropriate level in the superior vena cava. The catheter was then assembled and aspirated and flushed easily. It was then secured to the anterior chest wall with interrupted Prolene sutures. The catheter was flushed one last time and the wounds were then closed. The subcutaneous tissues were closed with running absorbable sutures and the skin with nylon. Sterile dressings were applied. Patient was then transported to the recovery room in stable condition.   [name], MD

## 2016-10-26 ENCOUNTER — Other Ambulatory Visit: Payer: Self-pay | Admitting: *Deleted

## 2016-10-26 ENCOUNTER — Telehealth: Payer: Self-pay

## 2016-10-26 ENCOUNTER — Inpatient Hospital Stay: Payer: Medicare HMO | Attending: Oncology

## 2016-10-26 DIAGNOSIS — C773 Secondary and unspecified malignant neoplasm of axilla and upper limb lymph nodes: Secondary | ICD-10-CM | POA: Insufficient documentation

## 2016-10-26 DIAGNOSIS — R110 Nausea: Secondary | ICD-10-CM | POA: Insufficient documentation

## 2016-10-26 DIAGNOSIS — I10 Essential (primary) hypertension: Secondary | ICD-10-CM | POA: Insufficient documentation

## 2016-10-26 DIAGNOSIS — F17210 Nicotine dependence, cigarettes, uncomplicated: Secondary | ICD-10-CM | POA: Insufficient documentation

## 2016-10-26 DIAGNOSIS — E876 Hypokalemia: Secondary | ICD-10-CM | POA: Insufficient documentation

## 2016-10-26 DIAGNOSIS — D6481 Anemia due to antineoplastic chemotherapy: Secondary | ICD-10-CM | POA: Insufficient documentation

## 2016-10-26 DIAGNOSIS — Z5111 Encounter for antineoplastic chemotherapy: Secondary | ICD-10-CM | POA: Insufficient documentation

## 2016-10-26 DIAGNOSIS — Z171 Estrogen receptor negative status [ER-]: Secondary | ICD-10-CM | POA: Insufficient documentation

## 2016-10-26 DIAGNOSIS — K219 Gastro-esophageal reflux disease without esophagitis: Secondary | ICD-10-CM | POA: Insufficient documentation

## 2016-10-26 DIAGNOSIS — J449 Chronic obstructive pulmonary disease, unspecified: Secondary | ICD-10-CM | POA: Insufficient documentation

## 2016-10-26 DIAGNOSIS — R197 Diarrhea, unspecified: Secondary | ICD-10-CM | POA: Insufficient documentation

## 2016-10-26 DIAGNOSIS — Z79899 Other long term (current) drug therapy: Secondary | ICD-10-CM | POA: Insufficient documentation

## 2016-10-26 DIAGNOSIS — R918 Other nonspecific abnormal finding of lung field: Secondary | ICD-10-CM | POA: Insufficient documentation

## 2016-10-26 DIAGNOSIS — F419 Anxiety disorder, unspecified: Secondary | ICD-10-CM | POA: Insufficient documentation

## 2016-10-26 DIAGNOSIS — Z7689 Persons encountering health services in other specified circumstances: Secondary | ICD-10-CM | POA: Insufficient documentation

## 2016-10-26 DIAGNOSIS — R531 Weakness: Secondary | ICD-10-CM | POA: Insufficient documentation

## 2016-10-26 DIAGNOSIS — C50411 Malignant neoplasm of upper-outer quadrant of right female breast: Secondary | ICD-10-CM | POA: Insufficient documentation

## 2016-10-26 NOTE — Telephone Encounter (Signed)
Post-op call made to patient at this time. Left message for patient asking how she was feeling since having her port placement and if she had any questions or concerns to give our office a call back.

## 2016-10-27 ENCOUNTER — Telehealth: Payer: Self-pay | Admitting: *Deleted

## 2016-10-27 NOTE — Progress Notes (Signed)
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  INTERVAL HISTORY: Patient returns to clinic today to initiate cycle 1 of 6 of Taxotere, carboplatinum, Herceptin, and Perjeta. She continues to be anxious, but otherwise feels well. She has no neurologic complaints. She denies any recent fevers or illnesses. She has a good appetite and denies weight loss. She has no chest pain or shortness of breath. She denies any nausea, vomiting, constipation, or diarrhea. She has no urinary complaints. Patient offers no further specific complaints today.  REVIEW OF SYSTEMS:   Review of Systems  Constitutional: Negative.  Negative for fever, malaise/fatigue and weight loss.  Respiratory: Negative.  Negative for cough and shortness of breath.   Cardiovascular: Negative.  Negative for chest pain and leg swelling.  Gastrointestinal: Negative.  Negative for abdominal pain.  Genitourinary: Negative.   Musculoskeletal: Negative.   Skin: Negative.  Negative for rash.  Neurological: Negative.  Negative for weakness.  Psychiatric/Behavioral: The patient is nervous/anxious.     As per HPI. Otherwise, a complete review of systems is negative.  PAST MEDICAL HISTORY: Past Medical History:  Diagnosis Date  . Anxiety   . Arthritis   . Cancer [facility])    Right Breast Cancer  . COPD (chronic obstructive pulmonary disease) ([place])   . Dyspnea    with exertion  . GERD (gastroesophageal reflux disease)   . Hypertension     PAST SURGICAL HISTORY: Past Surgical History:  Procedure Laterality Date  . ABDOMINAL HYSTERECTOMY  1990   Partial  . BREAST BIOPSY Right 10/04/2016   axilla lymph node and axillay tail  mass biopsy. Path pending  . DILATION AND CURETTAGE OF UTERUS    . PORTACATH PLACEMENT Left 10/24/2016   Procedure: INSERTION PORT-A-CATH;  Surgeon: [name], MD;  Location: [facility] ORS;  Service: General;  Laterality: Left;    FAMILY HISTORY: Family History  Problem Relation Age of Onset  . Cancer Mother   . Breast cancer Neg Hx     ADVANCED DIRECTIVES (Y/N):  N  HEALTH MAINTENANCE: Social History  Substance Use Topics  . Smoking status: Former Smoker    Packs/day: 0.50    Types: Cigarettes    Quit date: 07/23/1988  . Smokeless tobacco: Never Used  . Alcohol use No     Colonoscopy:  PAP:  Bone density:  Lipid panel:  Allergies  Allergen Reactions  . Atorvastatin Other (See Comments)    Muscle Pain  . Gabapentin Swelling  . Penicillins Swelling    Current Outpatient Prescriptions  Medication Sig Dispense Refill  . ADVAIR DISKUS 500-50 MCG/DOSE AEPB Inhale 1 puff into the lungs 2 (two) times daily.     Marland Kitchen ALPRAZolam (XANAX) 0.25 MG tablet Take 1 tablet (0.25 mg total) by mouth at bedtime as needed for anxiety. 30 tablet 0  . baclofen (LIORESAL) 10 MG tablet Take 10 mg by mouth at bedtime.     Marland Kitchen co-enzyme Q-10 30 MG capsule Take 30 mg by mouth daily.    . DULoxetine (CYMBALTA) 30 MG capsule Take 30 mg by mouth daily.    . DULoxetine (CYMBALTA) 60 MG capsule Take 1 capsule (60 mg total) by mouth daily. 30 capsule 0  . ipratropium (ATROVENT HFA) 17  MCG/ACT inhaler Inhale 2 puffs into the lungs 3 (three) times daily.    Marland Kitchen lidocaine-prilocaine (EMLA) cream Apply to affected area once 30 g 3  . losartan-hydrochlorothiazide (HYZAAR) 100-25 MG tablet Take 1 tablet by mouth daily.     . montelukast (SINGULAIR) 10 MG tablet Take 10 mg by mouth daily.     . Omega-3 Fatty Acids (FISH OIL PO) Take 1 capsule by mouth daily.     Marland Kitchen omeprazole (PRILOSEC) 20 MG capsule Take 20 mg by mouth daily.    . ondansetron (ZOFRAN) 8 MG tablet Take 1 tablet (8 mg total) by mouth 2 (two) times  daily as needed for refractory nausea / vomiting. 60 tablet 2  . oxyCODONE (OXY IR/ROXICODONE) 5 MG immediate release tablet Take 1-2 tablets (5-10 mg total) by mouth once as needed for moderate pain. 15 tablet 0  . Potassium Chloride ER 20 MEQ TBCR Take 1 tablet by mouth daily.     . prochlorperazine (COMPAZINE) 10 MG tablet Take 1 tablet (10 mg total) by mouth every 6 (six) hours as needed (Nausea or vomiting). 60 tablet 2   No current facility-administered medications for this visit.     OBJECTIVE: Vitals:   10/31/16 0900  BP: (!) 141/85  Pulse: 90  Resp: 20  Temp: 98.4 F (36.9 C)     Body mass index is 33.25 kg/m.    ECOG FS:0 - Asymptomatic  General: Well-developed, well-nourished, no acute distress. Eyes: Pink conjunctiva, anicteric sclera. Breasts: Easily palpable right breast mass. Lungs: Clear to auscultation bilaterally. Heart: Regular rate and rhythm. No rubs, murmurs, or gallops. Abdomen: Soft, nontender, nondistended. No organomegaly noted, normoactive bowel sounds. Musculoskeletal: No edema, cyanosis, or clubbing. Neuro: Alert, answering all questions appropriately. Cranial nerves grossly intact. Skin: No rashes or petechiae noted. Psych: Normal affect.   LAB RESULTS:  Lab Results  Component Value Date   NA 138 10/31/2016   K 3.6 10/31/2016   CL 103 10/31/2016   CO2 25 10/31/2016   GLUCOSE 182 (H) 10/31/2016   BUN 19 10/31/2016   CREATININE 1.08 (H) 10/31/2016   CALCIUM 8.8 (L) 10/31/2016   PROT 7.1 10/31/2016   ALBUMIN 3.3 (L) 10/31/2016   AST 24 10/31/2016   ALT 14 10/31/2016   ALKPHOS 65 10/31/2016   BILITOT 0.6 10/31/2016   GFRNONAA 49 (L) 10/31/2016   GFRAA 57 (L) 10/31/2016    Lab Results  Component Value Date   WBC 14.7 (H) 10/31/2016   NEUTROABS 9.5 (H) 10/31/2016   HGB 13.5 10/31/2016   HCT 40.1 10/31/2016   MCV 87.1 10/31/2016   PLT 315 10/31/2016     STUDIES: Dg Chest 2 View  Result Date: 10/20/2016 CLINICAL DATA:  Pre-op  CXR for port placement scheduled for 10-24-16. No current chest complaints. Hx of right breast cancer, COPD, HTN. Fomer smoker. EXAM: CHEST  2 VIEW COMPARISON:  Chest x-ray dated 04/19/2015. FINDINGS: Heart size and mediastinal contours are normal. Atherosclerotic change noted at the aortic arch. 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 Emphysematous bulla again noted within the upper lungs, stable. No new opacity to suggest a developing pneumonia. No pleural effusion or pneumothorax seen. No acute or suspicious osseous finding. IMPRESSION: 1. No acute findings.  No evidence of pneumonia or pulmonary edema. 2. Emphysema. 3. Aortic atherosclerosis. Electronically Signed   By: Franki Cabot M.D.   On: 10/20/2016 16:02   Nm Cardiac Muga Rest  Result Date: 10/20/2016 CLINICAL DATA:  Pre chemotherapy  for breast cancer with cardiotoxic drugs. EXAM: NUCLEAR MEDICINE CARDIAC BLOOD POOL IMAGING (MUGA) TECHNIQUE: Cardiac multi-gated acquisition was performed at rest following intravenous injection of Tc-63mlabeled red blood cells. RADIOPHARMACEUTICALS:  21.2 mCi Tc-965mDP in-vitro labeled red blood cells IV COMPARISON:  None. FINDINGS: The cine images demonstrate normal left ventricular wall motion. No akinetic or dyskinetic segments are identified. The patient's ejection fraction was calculated at 68%. IMPRESSION: Normal left ventricular wall motion with ejection fraction of 68%. Electronically Signed   By: P.Marijo Sanes.D.   On: 10/20/2016 16:46   Dg Chest Port 1 View  Result Date: 10/24/2016 CLINICAL DATA:  Post Port a catheter insertion. History of COPD and hypertension. Former smoker. EXAM: PORTABLE CHEST 1 VIEW COMPARISON:  10/20/2016; 04/19/2015 FINDINGS: Grossly unchanged cardiac silhouette and mediastinal contours with atherosclerotic plaque within thoracic aorta. Interval placement of left anterior chest wall subclavian vein approach port a catheter with tip projected over the  superior cavoatrial junction. The lungs remain hyperexpanded with flattening of the diaphragms and thinning of the biapical pulmonary parenchyma. Slight worsening of bilateral mid and lower lung heterogeneous opacities favored to represent atelectasis. No definite pleural effusion. No evidence of edema. No acute osseus abnormalities. IMPRESSION: 1. Uncomplicated placement of left subclavian vein approach port a catheter with tip projected over the superior cavoatrial junction. Specifically, no evidence of pneumothorax. 2. Worsening bibasilar atelectasis without superimposed acute cardiopulmonary disease. 3. Aortic Atherosclerosis (ICD10-I70.0) and Emphysema (ICD10-J43.9). Electronically Signed   By: JoSandi Mariscal.D.   On: 10/24/2016 09:15   Dg C-arm 1-60 Min-no Report  Result Date: 10/24/2016 Fluoroscopy was utilized by the requesting physician.  No radiographic interpretation.   Mm Clip Placement Right  Result Date: 10/04/2016 CLINICAL DATA:  [age]ear old female status post ultrasound-guided biopsies of a right axillary tail mass and an enlarged right axillary lymph node EXAM: DIAGNOSTIC RIGHT MAMMOGRAM POST ULTRASOUND BIOPSY COMPARISON:  Previous exam(s). FINDINGS: A right MLO view was obtained following ultrasound-guided biopsies of a right axillary tail mass and an enlarged right axillary lymph node. Post biopsy mammogram demonstrates the [facility] 4 butterfly marker to be in the expected location of the enlarged right axillary lymph node and the ribbon shaped biopsy marker to be in the expected location of the right axillary tail mass. IMPRESSION: Appropriate marker positions as above. Final Assessment: Post Procedure Mammograms for Marker Placement Electronically Signed   By: ErPamelia Hoit.D.   On: 10/04/2016 09:23   Us[place]oreat Breast Bx W Loc Dev 1st Lesion Img Bx Spec UsKoreauide  Addendum Date: 10/11/2016   ADDENDUM REPORT: 10/11/2016 15:02 ADDENDUM: Pathology of the right axillary lymph node biopsy  revealed A. LYMPH NODE, RIGHT AXILLA; ULTRASOUND-GUIDED CORE BIOPSY: METASTATIC MAMMARY CARCINOMA. Comment: The metastasis measures at least 10 mm. It is morphologically similar to the right axillary tail mass. Please see the concurrent biopsy from the right axillary tail mass for immunohistochemistry/biomarker results, [id number]. This was found to be concordant by Dr. ShBrigitte PulseRecommendation: Recommend bilateral breast MRI with and without contrast to evaluate for a primary site. Also recommend oncology referral. The patient was contacted by phone by JoJetta LoutRRAnon Raicesn 10/06/16 for a post biopsy check. The patient stated she has a bruise but no bleeding or hematoma. Post biopsy instructions were reviewed with the patient. All of her questions were answered. She was encouraged to [facility] any further questions or concerns. The pathology report was not available at the time of conversation. The  patient was told that she will be contacted with results as soon as possible. A preliminary report had been relayed to Dr. Iona Beard by the pathologist on 10/06/16. Per notes in [place], Dr. Iona Beard will contact the patient and make the referral. On 10/10/16 following the final report on the evening of 10/09/16, recommendations for the breast MRI was relayed to Dr. Cleon Dew nurse Durward Parcel, RN. She will relay that information to Dr. Iona Beard. The patient was contacted again on 10/11/16 by Jetta Lout, RRA to confirm that the patient had been given her results. She stated that Dr. Iona Beard called her on 10/08/16. An oncology appointment has been made with Dr. Grayland Ormond for 10/16/16 at 5:15 PM. Addendum by Jetta Lout, [place] on 10/11/16. Electronically Signed   By: Pamelia Hoit M.D.   On: 10/11/2016 15:02   Result Date: 10/11/2016 CLINICAL DATA:  76 year old female for ultrasound-guided biopsy of an enlarged right axillary lymph nodes EXAM: ULTRASOUND GUIDED CORE NEEDLE BIOPSY OF A RIGHT AXILLARY NODE  COMPARISON:  Previous exam(s). FINDINGS: I met with the patient and we discussed the procedure of ultrasound-guided biopsy, including benefits and alternatives. We discussed the high likelihood of a successful procedure. We discussed the risks of the procedure, including infection, bleeding, tissue injury, clip migration, and inadequate sampling. Informed written consent was given. The usual time-out protocol was performed immediately prior to the procedure. Using sterile technique and 1% Lidocaine as local anesthetic, under direct ultrasound visualization, a 14 gauge spring-loaded device was used to perform biopsy of an enlarged right axillary lymph node using a lateral to medial approach. Core samples were sent in both formalin and saline. At the conclusion of the procedure a HydroMARK shape 4 butterfly tissue marker clip was deployed into the biopsy cavity. Follow up was performed and dictated separately. IMPRESSION: Ultrasound guided biopsy of an enlarged right axillary lymph node. No apparent complications. Electronically Signed: By: Pamelia Hoit M.D. On: 10/04/2016 09:24   [place] Rt Breast Bx W Loc Dev Ea Add Lesion Img Bx Spec US Guide  Addendum Date: 10/11/2016   ADDENDUM REPORT: 10/11/2016 14:33 ADDENDUM: Pathology of the right axillary tail mass revealed A. AXILLARY TAIL MASS, RIGHT; ULTRASOUND-GUIDED CORE BIOPSY: INVASIVE MAMMARY CARCINOMA, NO SPECIAL TYPE, GRADE 3, SEE COMMENT. Comment: No nodal remnant, in situ component, or benign mammary tissue is present in this specimen, so it cannot be reliably determined if this is the primary site by histology. Correlation with imaging results is recommended; additional evaluation may be needed if there is concern about another possible primary site. Size of invasive carcinoma: 14 mm in this sample. Histologic grade of invasive carcinoma: Grade 3. Ductal carcinoma in situ: Not identified. Lymphovascular invasion: Not identified. Immunohistochemistry for GATA3 was  performed, and the carcinoma demonstrates diffuse moderate nuclear staining, supporting breast origin. These findings were communicated to [place] in the [facility] on 10/09/16 at 3:00 PM. Read-back procedure was performed. Please see also the separate report from the concurrent right axillary lymph node biopsy showing metastatic carcinoma ([id number]). This was found to be concordant by Dr. Brigitte Pulse. Recommendation: Recommend bilateral breast MRI with and without contrast to evaluate for a primary site. Also recommend oncology referral. The patient was contacted by phone by Jetta Lout, [facility]) for a post biopsy check on 10/06/16 at 3:40 PM. The patient stated she had done well following the biopsy with only a bruise but no bleeding or hematoma. Post biopsy instructions were reviewed with the patient. All of her questions  were answered. Results were not available at the time of conversation. The patient was told she will be contacted with results. She was encouraged to call the [facility] with any further questions or concerns. Preliminary results were discussed with Dr. Iona Beard by the pathologist on 10/06/16. The final report became available on 10/09/16. Results and recommendations for the breast MRI was relayed to Durward Parcel, RN, nurse in Dr. Cleon Dew office. She will relay that information to Dr. Iona Beard. The patient was contacted again by phone by Jetta Lout, [place] on 10/11/16. The patient stated Dr. Iona Beard had contacted her with results on 10/08/16. An oncology appointment has been made with Dr. Grayland Ormond for 10/16/16 at 5:15 PM. Addendum by Jetta Lout, [place] on 10/11/16. Electronically Signed   By: Pamelia Hoit M.D.   On: 10/11/2016 14:33   Result Date: 10/11/2016 CLINICAL DATA:  76 year old female for ultrasound-guided biopsy of a right axillary tail mass EXAM: ULTRASOUND GUIDED RIGHT BREAST CORE NEEDLE BIOPSY COMPARISON:  Previous exam(s). FINDINGS: I  met with the patient and we discussed the procedure of ultrasound-guided biopsy, including benefits and alternatives. We discussed the high likelihood of a successful procedure. We discussed the risks of the procedure, including infection, bleeding, tissue injury, clip migration, and inadequate sampling. Informed written consent was given. The usual time-out protocol was performed immediately prior to the procedure. Using sterile technique and 1% Lidocaine as local anesthetic, under direct ultrasound visualization, a 14 gauge spring-loaded device was used to perform biopsy of a right axillary tail mass using a lateral to medial approach. At the conclusion of the procedure a ribbon shaped tissue marker clip was deployed into the biopsy cavity. Follow up mammogram was performed and dictated separately. IMPRESSION: Ultrasound guided biopsy of a right axillary tail mass. No apparent complications. Electronically Signed: By: Pamelia Hoit M.D. 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  PLAN:    1. 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 Patient will require Neulasta support. Pretreatment MUGA scan revealed an EF of 68%. Repeat in October 2018. Proceed with cycle 1 of 6 of Taxotere, carboplatinum, Herceptin, and Perjeta. Patient will also require a total of one year of Herceptin. At the conclusion of her treatment, she will be referred to surgery for lumpectomy. Patient will require adjuvant XRT if she has a lumpectomy. Finally, given the PR positivity of her tumor she will benefit from an aromatase inhibitor for 5 years. Return to clinic on in 1 week for laboratory work and  further evaluation and then in 3 weeks for consideration of cycle 2.  Approximately 30 minutes was spent in discussion of which greater than 50% was consultation.   Patient expressed understanding and was in agreement with this plan. She also understands that She can call clinic at any time with any questions, concerns, or complaints.   Cancer Staging Malignant neoplasm of upper outer quadrant of female breast [facility]) Staging form: Breast, AJCC 8th Edition - Clinical stage from 10/16/2016: Stage IIB (cT2, cN1, cM0, G3, ER: Negative, PR: Positive, HER2: Positive) - Signed by Lloyd Huger, MD on 10/16/2016   Lloyd Huger, MD   11/01/2016 11:49 AM

## 2016-10-27 NOTE — Telephone Encounter (Signed)
error 

## 2016-10-30 ENCOUNTER — Ambulatory Visit (INDEPENDENT_AMBULATORY_CARE_PROVIDER_SITE_OTHER): Payer: Medicare HMO | Admitting: Cardiothoracic Surgery

## 2016-10-30 ENCOUNTER — Encounter: Payer: Self-pay | Admitting: Cardiothoracic Surgery

## 2016-10-30 VITALS — BP 149/86 | HR 97 | Temp 98.2°F | Resp 18 | Ht 62.0 in | Wt 181.6 lb

## 2016-10-30 DIAGNOSIS — C50411 Malignant neoplasm of upper-outer quadrant of right female breast: Secondary | ICD-10-CM

## 2016-10-30 NOTE — Patient Instructions (Signed)
We would like for you to see DR.Eulogio Bear today @ 3:40. We will plan to remove your sutures on 11-03-16. Please come to our office around 9 am. If you have any questions or concerns please call our office.

## 2016-10-30 NOTE — Progress Notes (Signed)
She returns today in follow-up. She is now 6 days out from her Port-A-Cath insertion. The only new complaint she has today is that she passed some bright red blood per vagina. This has been the first time occurrence. She stopped after one episode. There's been no further bleeding. It began this morning. I told her that should need to follow-up with her primary care physician for that. Our office will contact her primary care physician and arrange for that.  Her Port-A-Cath incisions are all well-healed. The Port-A-Cath itself is in good position without any problems. I would like her to leave the sutures in for a few more days as they do not appear to have any significant reaction to them. She'll come back later this week to have those removed. No further follow-up was made.

## 2016-10-31 ENCOUNTER — Inpatient Hospital Stay (HOSPITAL_BASED_OUTPATIENT_CLINIC_OR_DEPARTMENT_OTHER): Payer: Medicare HMO | Admitting: Oncology

## 2016-10-31 ENCOUNTER — Inpatient Hospital Stay: Payer: Medicare HMO

## 2016-10-31 ENCOUNTER — Encounter: Payer: Self-pay | Admitting: *Deleted

## 2016-10-31 VITALS — BP 141/85 | HR 90 | Temp 98.4°F | Resp 20 | Wt 181.8 lb

## 2016-10-31 VITALS — BP 140/81 | HR 85 | Temp 98.3°F | Resp 18

## 2016-10-31 DIAGNOSIS — I10 Essential (primary) hypertension: Secondary | ICD-10-CM | POA: Diagnosis not present

## 2016-10-31 DIAGNOSIS — J449 Chronic obstructive pulmonary disease, unspecified: Secondary | ICD-10-CM | POA: Diagnosis not present

## 2016-10-31 DIAGNOSIS — C773 Secondary and unspecified malignant neoplasm of axilla and upper limb lymph nodes: Secondary | ICD-10-CM | POA: Diagnosis not present

## 2016-10-31 DIAGNOSIS — R197 Diarrhea, unspecified: Secondary | ICD-10-CM | POA: Diagnosis not present

## 2016-10-31 DIAGNOSIS — C50411 Malignant neoplasm of upper-outer quadrant of right female breast: Secondary | ICD-10-CM | POA: Diagnosis not present

## 2016-10-31 DIAGNOSIS — R531 Weakness: Secondary | ICD-10-CM | POA: Diagnosis not present

## 2016-10-31 DIAGNOSIS — R918 Other nonspecific abnormal finding of lung field: Secondary | ICD-10-CM

## 2016-10-31 DIAGNOSIS — F17210 Nicotine dependence, cigarettes, uncomplicated: Secondary | ICD-10-CM | POA: Diagnosis not present

## 2016-10-31 DIAGNOSIS — K219 Gastro-esophageal reflux disease without esophagitis: Secondary | ICD-10-CM | POA: Diagnosis not present

## 2016-10-31 DIAGNOSIS — R110 Nausea: Secondary | ICD-10-CM | POA: Diagnosis not present

## 2016-10-31 DIAGNOSIS — Z79899 Other long term (current) drug therapy: Secondary | ICD-10-CM

## 2016-10-31 DIAGNOSIS — Z5111 Encounter for antineoplastic chemotherapy: Secondary | ICD-10-CM | POA: Diagnosis present

## 2016-10-31 DIAGNOSIS — Z171 Estrogen receptor negative status [ER-]: Secondary | ICD-10-CM

## 2016-10-31 DIAGNOSIS — E876 Hypokalemia: Secondary | ICD-10-CM | POA: Diagnosis not present

## 2016-10-31 DIAGNOSIS — F419 Anxiety disorder, unspecified: Secondary | ICD-10-CM | POA: Diagnosis not present

## 2016-10-31 DIAGNOSIS — D6481 Anemia due to antineoplastic chemotherapy: Secondary | ICD-10-CM | POA: Diagnosis not present

## 2016-10-31 DIAGNOSIS — Z7689 Persons encountering health services in other specified circumstances: Secondary | ICD-10-CM | POA: Diagnosis not present

## 2016-10-31 LAB — CBC WITH DIFFERENTIAL/PLATELET
BASOS ABS: 0.2 10*3/uL — AB (ref 0–0.1)
Basophils Relative: 1 %
EOS PCT: 6 %
Eosinophils Absolute: 0.9 10*3/uL — ABNORMAL HIGH (ref 0–0.7)
HCT: 40.1 % (ref 35.0–47.0)
Hemoglobin: 13.5 g/dL (ref 12.0–16.0)
LYMPHS PCT: 18 %
Lymphs Abs: 2.7 10*3/uL (ref 1.0–3.6)
MCH: 29.4 pg (ref 26.0–34.0)
MCHC: 33.8 g/dL (ref 32.0–36.0)
MCV: 87.1 fL (ref 80.0–100.0)
MONO ABS: 1.3 10*3/uL — AB (ref 0.2–0.9)
MONOS PCT: 9 %
Neutro Abs: 9.5 10*3/uL — ABNORMAL HIGH (ref 1.4–6.5)
Neutrophils Relative %: 66 %
PLATELETS: 315 10*3/uL (ref 150–440)
RBC: 4.6 MIL/uL (ref 3.80–5.20)
RDW: 14.3 % (ref 11.5–14.5)
WBC: 14.7 10*3/uL — ABNORMAL HIGH (ref 3.6–11.0)

## 2016-10-31 LAB — COMPREHENSIVE METABOLIC PANEL
ALT: 14 U/L (ref 14–54)
ANION GAP: 10 (ref 5–15)
AST: 24 U/L (ref 15–41)
Albumin: 3.3 g/dL — ABNORMAL LOW (ref 3.5–5.0)
Alkaline Phosphatase: 65 U/L (ref 38–126)
BUN: 19 mg/dL (ref 6–20)
CHLORIDE: 103 mmol/L (ref 101–111)
CO2: 25 mmol/L (ref 22–32)
Calcium: 8.8 mg/dL — ABNORMAL LOW (ref 8.9–10.3)
Creatinine, Ser: 1.08 mg/dL — ABNORMAL HIGH (ref 0.44–1.00)
GFR, EST AFRICAN AMERICAN: 57 mL/min — AB (ref >60)
GFR, EST NON AFRICAN AMERICAN: 49 mL/min — AB (ref >60)
Glucose, Bld: 182 mg/dL — ABNORMAL HIGH (ref 65–99)
POTASSIUM: 3.6 mmol/L (ref 3.5–5.1)
Sodium: 138 mmol/L (ref 135–145)
Total Bilirubin: 0.6 mg/dL (ref 0.3–1.2)
Total Protein: 7.1 g/dL (ref 6.5–8.1)

## 2016-10-31 MED ORDER — SODIUM CHLORIDE 0.9 % IV SOLN
Freq: Once | INTRAVENOUS | Status: AC
  Filled 2016-10-31: qty 1000

## 2016-10-31 MED ORDER — SODIUM CHLORIDE 0.9 % IV SOLN: 459.6000 mg | Freq: Once | INTRAVENOUS | Status: DC

## 2016-10-31 MED ORDER — DEXAMETHASONE SODIUM PHOSPHATE 10 MG/ML IJ SOLN
10.0000 mg | Freq: Once | INTRAMUSCULAR | Status: AC
  Administered 2016-10-31: 10 mg via INTRAVENOUS

## 2016-10-31 MED ORDER — TRASTUZUMAB CHEMO 150 MG IV SOLR
600.0000 mg | Freq: Once | INTRAVENOUS | Status: AC
  Administered 2016-10-31: 600 mg via INTRAVENOUS
  Filled 2016-10-31: qty 28.57

## 2016-10-31 MED ORDER — SODIUM CHLORIDE 0.9 % IV SOLN
500.0000 mg | Freq: Once | INTRAVENOUS | Status: AC
  Administered 2016-10-31: 500 mg via INTRAVENOUS
  Filled 2016-10-31: qty 50

## 2016-10-31 MED ORDER — SODIUM CHLORIDE 0.9% FLUSH
10.0000 mL | Freq: Once | INTRAVENOUS | Status: AC
  Administered 2016-10-31: 10 mL via INTRAVENOUS
  Filled 2016-10-31: qty 10

## 2016-10-31 MED ORDER — DIPHENHYDRAMINE HCL 25 MG PO CAPS
25.0000 mg | ORAL_CAPSULE | Freq: Once | ORAL | Status: AC
  Administered 2016-10-31: 25 mg via ORAL

## 2016-10-31 MED ORDER — PALONOSETRON HCL INJECTION 0.25 MG/5ML
0.2500 mg | Freq: Once | INTRAVENOUS | Status: AC
  Administered 2016-10-31: 0.25 mg via INTRAVENOUS

## 2016-10-31 MED ORDER — HEPARIN SOD (PORK) LOCK FLUSH 100 UNIT/ML IV SOLN
500.0000 [IU] | Freq: Once | INTRAVENOUS | Status: AC
  Administered 2016-10-31: 500 [IU] via INTRAVENOUS

## 2016-10-31 MED ORDER — SODIUM CHLORIDE 0.9 % IV SOLN
840.0000 mg | Freq: Once | INTRAVENOUS | Status: AC
  Administered 2016-10-31: 840 mg via INTRAVENOUS
  Filled 2016-10-31: qty 28

## 2016-10-31 MED ORDER — PEGFILGRASTIM 6 MG/0.6ML ~~LOC~~ PSKT
6.0000 mg | PREFILLED_SYRINGE | Freq: Once | SUBCUTANEOUS | Status: AC
  Administered 2016-10-31: 6 mg via SUBCUTANEOUS

## 2016-10-31 MED ORDER — DOCETAXEL CHEMO INJECTION 160 MG/16ML
75.0000 mg/m2 | Freq: Once | INTRAVENOUS | Status: AC
  Administered 2016-10-31: 140 mg via INTRAVENOUS
  Filled 2016-10-31: qty 14

## 2016-10-31 MED ORDER — DEXAMETHASONE SODIUM PHOSPHATE 100 MG/10ML IJ SOLN: 10.0000 mg | Freq: Once | INTRAMUSCULAR | Status: DC

## 2016-10-31 MED ORDER — ACETAMINOPHEN 325 MG PO TABS
650.0000 mg | ORAL_TABLET | Freq: Once | ORAL | Status: AC
  Administered 2016-10-31: 650 mg via ORAL

## 2016-10-31 MED ORDER — DOCETAXEL CHEMO INJECTION 160 MG/16ML: 75.0000 mg/m2 | Freq: Once | INTRAVENOUS | Status: DC

## 2016-10-31 NOTE — Progress Notes (Signed)
  Oncology Nurse Navigator Documentation  Navigator Location: [facility] (10/31/16 1400)   )Navigator Encounter Type: Treatment (10/31/16 1400)                       Treatment Phase: First Chemo Tx (10/31/16 1400)                            Time Spent with Patient: 15 (10/31/16 1400)   Met patient and her daughter during her first chemo treatment.  Offered support.  She is to call with any questions or needs.

## 2016-10-31 NOTE — Progress Notes (Signed)
Patient here today for ongoing follow up and treatment consideration for breast cancer, reports feeling anxious about first treatment today.

## 2016-11-03 ENCOUNTER — Ambulatory Visit: Payer: Medicare HMO | Admitting: Cardiothoracic Surgery

## 2016-11-03 VITALS — BP 144/83 | HR 96 | Ht 62.0 in | Wt 179.0 lb

## 2016-11-03 DIAGNOSIS — Z4802 Encounter for removal of sutures: Secondary | ICD-10-CM

## 2016-11-03 NOTE — Progress Notes (Signed)
Incision looked well healed. No redness, no drainage.  Sutures removed without difficulty and steri strips applied. Discussed signs and symptoms of infection and patient was asked to call our office if she has any issues.

## 2016-11-03 NOTE — Patient Instructions (Signed)
Your sutures were removed today and steri strips were placed over the incision. These will begin to fall off in 7-10 days.  Please call our office if you have any questions or concerns.

## 2016-11-05 NOTE — Progress Notes (Signed)
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  INTERVAL HISTORY: Patient returns to clinic today for further evaluation and to assess her toleration of cycle 1 of 6 of Taxotere, carboplatinum, Herceptin, and Perjeta. She had increased nausea which was not helped with either Compazine or Zofran, but states Phenergan did work for her. She continues to have a poor appetite and increased diarrhea. She has increased weakness and fatigue. She has no neurologic complaints. She denies any recent fevers or illnesses. She has no chest pain or shortness of breath.  She has no urinary complaints. Patient feels generally terrible, but offers no further specific complaints today.  REVIEW OF SYSTEMS:   Review of Systems  Constitutional: Positive for malaise/fatigue and weight loss. Negative for fever.  Respiratory: Negative.  Negative for cough and shortness of breath.   Cardiovascular: Negative.  Negative for chest pain and leg swelling.  Gastrointestinal: Positive for diarrhea and nausea. Negative for abdominal pain.  Genitourinary: Negative.   Musculoskeletal: Negative.   Skin: Negative.  Negative for rash.  Neurological: Positive for weakness.  Psychiatric/Behavioral: The patient is nervous/anxious.     As per HPI. Otherwise, a complete review of systems is negative.  PAST MEDICAL HISTORY: Past Medical History:  Diagnosis Date  . Anxiety   . Arthritis   . Cancer [facility])    Right Breast Cancer  . COPD (chronic obstructive pulmonary disease) ([place])   . Dyspnea    with exertion  . GERD (gastroesophageal reflux disease)   . Hypertension     PAST SURGICAL HISTORY: Past Surgical History:   Procedure Laterality Date  . ABDOMINAL HYSTERECTOMY  1990   Partial  . BREAST BIOPSY Right 10/04/2016   axilla lymph node and axillay tail mass biopsy. Path pending  . DILATION AND CURETTAGE OF UTERUS    . PORTACATH PLACEMENT Left 10/24/2016   Procedure: INSERTION PORT-A-CATH;  Surgeon: [name], MD;  Location: [facility] ORS;  Service: General;  Laterality: Left;    FAMILY HISTORY: Family History  Problem Relation Age of Onset  . Cancer Mother   . Breast cancer Neg Hx     ADVANCED DIRECTIVES (Y/N):  N  HEALTH MAINTENANCE: Social History  Substance Use Topics  . Smoking status: Former Smoker    Packs/day: 0.50    Types: Cigarettes    Quit date: 07/23/1988  . Smokeless tobacco: Never Used  . Alcohol use No     Colonoscopy:  PAP:  Bone density:  Lipid panel:  Allergies  Allergen Reactions  . Atorvastatin Other (See Comments)    Muscle Pain  . Gabapentin Swelling  . Penicillins Swelling    Current Outpatient Prescriptions  Medication Sig Dispense Refill  . ADVAIR DISKUS 500-50 MCG/DOSE AEPB Inhale 1 puff into the lungs 2 (two) times daily.     Marland Kitchen ALPRAZolam (XANAX) 0.25 MG tablet Take 1 tablet (0.25 mg total) by mouth at bedtime as needed for anxiety. 30 tablet 0  . baclofen (LIORESAL) 10 MG tablet Take 10 mg by mouth at bedtime.     Marland Kitchen co-enzyme Q-10 30 MG capsule Take 30 mg by mouth daily.    . DULoxetine (CYMBALTA) 30 MG capsule Take 30 mg by mouth daily.    Marland Kitchen  DULoxetine (CYMBALTA) 60 MG capsule Take 1 capsule (60 mg total) by mouth daily. 30 capsule 0  . ipratropium (ATROVENT HFA) 17 MCG/ACT inhaler Inhale 2 puffs into the lungs 3 (three) times daily.    Marland Kitchen lidocaine-prilocaine (EMLA) cream Apply to affected area once 30 g 3  . losartan-hydrochlorothiazide (HYZAAR) 100-25 MG tablet Take 1 tablet by mouth daily.     . montelukast (SINGULAIR) 10 MG tablet Take 10 mg by mouth daily.     . Omega-3 Fatty Acids (FISH OIL PO) Take 1 capsule by mouth daily.     Marland Kitchen omeprazole  (PRILOSEC) 20 MG capsule Take 20 mg by mouth daily.    . ondansetron (ZOFRAN) 8 MG tablet Take 1 tablet (8 mg total) by mouth 2 (two) times daily as needed for refractory nausea / vomiting. 60 tablet 2  . oxyCODONE (OXY IR/ROXICODONE) 5 MG immediate release tablet Take 1-2 tablets (5-10 mg total) by mouth once as needed for moderate pain. 15 tablet 0  . Potassium Chloride ER 20 MEQ TBCR Take 1 tablet by mouth daily.     . prochlorperazine (COMPAZINE) 10 MG tablet Take 1 tablet (10 mg total) by mouth every 6 (six) hours as needed (Nausea or vomiting). 60 tablet 2  . promethazine (PHENERGAN) 25 MG tablet TAKE 1 TABLET BY MOUTH EVERY 6-8 HOURS AS NEEDED FOR NAUSEA  2  . diphenoxylate-atropine (LOMOTIL) 2.5-0.025 MG tablet Take 1 tablet by mouth 4 (four) times daily as needed for diarrhea or loose stools. 30 tablet 1   No current facility-administered medications for this visit.     OBJECTIVE: Vitals:   11/07/16 1516  BP: 123/81  Pulse: 97  Resp: 18  Temp: 98.7 F (37.1 C)     Body mass index is 32.17 kg/m.    ECOG FS:0 - Asymptomatic  General: Well-developed, well-nourished, no acute distress. Eyes: Pink conjunctiva, anicteric sclera. Breasts: Easily palpable right breast mass. Lungs: Clear to auscultation bilaterally. Heart: Regular rate and rhythm. No rubs, murmurs, or gallops. Abdomen: Soft, nontender, nondistended. No organomegaly noted, normoactive bowel sounds. Musculoskeletal: No edema, cyanosis, or clubbing. Neuro: Alert, answering all questions appropriately. Cranial nerves grossly intact. Skin: No rashes or petechiae noted. Psych: Normal affect.   LAB RESULTS:  Lab Results  Component Value Date   NA 134 (L) 11/07/2016   K 4.2 11/07/2016   CL 100 (L) 11/07/2016   CO2 25 11/07/2016   GLUCOSE 115 (H) 11/07/2016   BUN 31 (H) 11/07/2016   CREATININE 1.57 (H) 11/07/2016   CALCIUM 9.3 11/07/2016   PROT 7.6 11/07/2016   ALBUMIN 3.4 (L) 11/07/2016   AST 28 11/07/2016    ALT 27 11/07/2016   ALKPHOS 91 11/07/2016   BILITOT 0.4 11/07/2016   GFRNONAA 31 (L) 11/07/2016   GFRAA 36 (L) 11/07/2016    Lab Results  Component Value Date   WBC 17.1 (H) 11/07/2016   NEUTROABS 11.8 (H) 11/07/2016   HGB 14.1 11/07/2016   HCT 42.3 11/07/2016   MCV 86.6 11/07/2016   PLT 297 11/07/2016     STUDIES: Dg Chest 2 View  Result Date: 10/20/2016 CLINICAL DATA:  Pre-op CXR for port placement scheduled for 10-24-16. No current chest complaints. Hx of right breast cancer, COPD, HTN. Fomer smoker. EXAM: CHEST  2 VIEW COMPARISON:  Chest x-ray dated 04/19/2015. FINDINGS: Heart size and mediastinal contours are normal. Atherosclerotic change noted at the aortic arch. 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 Emphysematous bulla again noted within  the upper lungs, stable. No new opacity to suggest a developing pneumonia. No pleural effusion or pneumothorax seen. No acute or suspicious osseous finding. IMPRESSION: 1. No acute findings.  No evidence of pneumonia or pulmonary edema. 2. Emphysema. 3. Aortic atherosclerosis. Electronically Signed   By: Franki Cabot M.D.   On: 10/20/2016 16:02   Nm Cardiac Muga Rest  Result Date: 10/20/2016 CLINICAL DATA:  Pre chemotherapy for breast cancer with cardiotoxic drugs. EXAM: NUCLEAR MEDICINE CARDIAC BLOOD POOL IMAGING (MUGA) TECHNIQUE: Cardiac multi-gated acquisition was performed at rest following intravenous injection of Tc-13mlabeled red blood cells. RADIOPHARMACEUTICALS:  21.2 mCi Tc-964mDP in-vitro labeled red blood cells IV COMPARISON:  None. FINDINGS: The cine images demonstrate normal left ventricular wall motion. No akinetic or dyskinetic segments are identified. The patient's ejection fraction was calculated at 68%. IMPRESSION: Normal left ventricular wall motion with ejection fraction of 68%. Electronically Signed   By: P.Marijo Sanes.D.   On: 10/20/2016 16:46   Dg Chest Port 1 View  Result Date:  10/24/2016 CLINICAL DATA:  Post Port a catheter insertion. History of COPD and hypertension. Former smoker. EXAM: PORTABLE CHEST 1 VIEW COMPARISON:  10/20/2016; 04/19/2015 FINDINGS: Grossly unchanged cardiac silhouette and mediastinal contours with atherosclerotic plaque within thoracic aorta. Interval placement of left anterior chest wall subclavian vein approach port a catheter with tip projected over the superior cavoatrial junction. The lungs remain hyperexpanded with flattening of the diaphragms and thinning of the biapical pulmonary parenchyma. Slight worsening of bilateral mid and lower lung heterogeneous opacities favored to represent atelectasis. No definite pleural effusion. No evidence of edema. No acute osseus abnormalities. IMPRESSION: 1. Uncomplicated placement of left subclavian vein approach port a catheter with tip projected over the superior cavoatrial junction. Specifically, no evidence of pneumothorax. 2. Worsening bibasilar atelectasis without superimposed acute cardiopulmonary disease. 3. Aortic Atherosclerosis (ICD10-I70.0) and Emphysema (ICD10-J43.9). Electronically Signed   By: JoSandi Mariscal.D.   On: 10/24/2016 09:15   Dg C-arm 1-60 Min-no Report  Result Date: 10/24/2016 Fluoroscopy was utilized by the requesting physician.  No radiographic interpretation.    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  PLAN:    1. 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 Patient will require Neulasta support. Pretreatment MUGA scan revealed an EF of 68%. Repeat in October 2018. Patient had a difficult time with cycle 1 of 6 of Taxotere, carboplatinum, Herceptin, and Perjeta and  will consider dose reducing for subsequent cycles. Patient will also require a total of one year of Herceptin. At the conclusion of cycle 6 of chemotherapy, she will be referred to surgery for lumpectomy. Patient will require adjuvant XRT if she has a lumpectomy. Finally, given the PR positivity of her tumor she will benefit from an aromatase inhibitor for 5 years. Return to clinic on in 2 weeks for consideration of cycle 2. 2. Nausea: Patient will receive IV fluids as well as IV Zofran and Decadron today. Continue Phenergan as prescribed. 3. Diarrhea: Patient was given a prescription for Lomotil today. 4. Weakness and fatigue: Possibly secondary to dehydration. IV fluids as above.  Approximately 30 minutes was spent in discussion of which greater than 50% was consultation.   Patient expressed understanding and was in agreement with this  plan. She also understands that She can call clinic at any time with any questions, concerns, or complaints.   Cancer Staging Malignant neoplasm of upper outer quadrant of female breast [facility]) Staging form: Breast, AJCC 8th Edition - Clinical stage from 10/16/2016: Stage IIB (cT2, cN1, cM0, G3, ER: Negative, PR: Positive, HER2: Positive) - Signed by Lloyd Huger, MD on 10/16/2016   Lloyd Huger, MD   11/12/2016 11:57 AM

## 2016-11-06 ENCOUNTER — Ambulatory Visit: Payer: Self-pay | Admitting: Cardiothoracic Surgery

## 2016-11-07 ENCOUNTER — Inpatient Hospital Stay: Payer: Medicare HMO

## 2016-11-07 ENCOUNTER — Inpatient Hospital Stay (HOSPITAL_BASED_OUTPATIENT_CLINIC_OR_DEPARTMENT_OTHER): Payer: Medicare HMO | Admitting: Oncology

## 2016-11-07 VITALS — BP 123/81 | HR 97 | Temp 98.7°F | Resp 18 | Wt 175.9 lb

## 2016-11-07 DIAGNOSIS — R918 Other nonspecific abnormal finding of lung field: Secondary | ICD-10-CM | POA: Diagnosis not present

## 2016-11-07 DIAGNOSIS — C773 Secondary and unspecified malignant neoplasm of axilla and upper limb lymph nodes: Secondary | ICD-10-CM

## 2016-11-07 DIAGNOSIS — Z171 Estrogen receptor negative status [ER-]: Secondary | ICD-10-CM

## 2016-11-07 DIAGNOSIS — Z79899 Other long term (current) drug therapy: Secondary | ICD-10-CM | POA: Diagnosis not present

## 2016-11-07 DIAGNOSIS — R110 Nausea: Secondary | ICD-10-CM

## 2016-11-07 DIAGNOSIS — F17210 Nicotine dependence, cigarettes, uncomplicated: Secondary | ICD-10-CM | POA: Diagnosis not present

## 2016-11-07 DIAGNOSIS — R531 Weakness: Secondary | ICD-10-CM

## 2016-11-07 DIAGNOSIS — R197 Diarrhea, unspecified: Secondary | ICD-10-CM | POA: Diagnosis not present

## 2016-11-07 DIAGNOSIS — C50411 Malignant neoplasm of upper-outer quadrant of right female breast: Secondary | ICD-10-CM

## 2016-11-07 DIAGNOSIS — Z5111 Encounter for antineoplastic chemotherapy: Secondary | ICD-10-CM | POA: Diagnosis not present

## 2016-11-07 LAB — COMPREHENSIVE METABOLIC PANEL
ALBUMIN: 3.4 g/dL — AB (ref 3.5–5.0)
ALK PHOS: 91 U/L (ref 38–126)
ALT: 27 U/L (ref 14–54)
AST: 28 U/L (ref 15–41)
Anion gap: 9 (ref 5–15)
BILIRUBIN TOTAL: 0.4 mg/dL (ref 0.3–1.2)
BUN: 31 mg/dL — AB (ref 6–20)
CALCIUM: 9.3 mg/dL (ref 8.9–10.3)
CO2: 25 mmol/L (ref 22–32)
Chloride: 100 mmol/L — ABNORMAL LOW (ref 101–111)
Creatinine, Ser: 1.57 mg/dL — ABNORMAL HIGH (ref 0.44–1.00)
GFR calc Af Amer: 36 mL/min — ABNORMAL LOW (ref >60)
GFR calc non Af Amer: 31 mL/min — ABNORMAL LOW (ref >60)
GLUCOSE: 115 mg/dL — AB (ref 65–99)
Potassium: 4.2 mmol/L (ref 3.5–5.1)
Sodium: 134 mmol/L — ABNORMAL LOW (ref 135–145)
TOTAL PROTEIN: 7.6 g/dL (ref 6.5–8.1)

## 2016-11-07 LAB — CBC WITH DIFFERENTIAL/PLATELET
BASOS ABS: 0.1 10*3/uL (ref 0–0.1)
BASOS PCT: 1 %
Eosinophils Absolute: 0.1 10*3/uL (ref 0–0.7)
Eosinophils Relative: 0 %
HEMATOCRIT: 42.3 % (ref 35.0–47.0)
HEMOGLOBIN: 14.1 g/dL (ref 12.0–16.0)
LYMPHS PCT: 19 %
Lymphs Abs: 3.3 10*3/uL (ref 1.0–3.6)
MCH: 28.9 pg (ref 26.0–34.0)
MCHC: 33.3 g/dL (ref 32.0–36.0)
MCV: 86.6 fL (ref 80.0–100.0)
MONOS PCT: 11 %
Monocytes Absolute: 1.9 10*3/uL — ABNORMAL HIGH (ref 0.2–0.9)
NEUTROS ABS: 11.8 10*3/uL — AB (ref 1.4–6.5)
NEUTROS PCT: 69 %
Platelets: 297 10*3/uL (ref 150–440)
RBC: 4.88 MIL/uL (ref 3.80–5.20)
RDW: 13.8 % (ref 11.5–14.5)
WBC: 17.1 10*3/uL — ABNORMAL HIGH (ref 3.6–11.0)

## 2016-11-07 MED ORDER — HEPARIN SOD (PORK) LOCK FLUSH 100 UNIT/ML IV SOLN
INTRAVENOUS | Status: AC
  Filled 2016-11-07: qty 5

## 2016-11-07 MED ORDER — SODIUM CHLORIDE 0.9 % IV SOLN
Freq: Once | INTRAVENOUS | Status: AC
  Administered 2016-11-07: 16:00:00 via INTRAVENOUS
  Filled 2016-11-07: qty 4

## 2016-11-07 MED ORDER — SODIUM CHLORIDE 0.9 % IV SOLN
Freq: Once | INTRAVENOUS | Status: AC
  Administered 2016-11-07: 16:00:00 via INTRAVENOUS
  Filled 2016-11-07: qty 1000

## 2016-11-07 MED ORDER — HEPARIN SOD (PORK) LOCK FLUSH 100 UNIT/ML IV SOLN
500.0000 [IU] | Freq: Once | INTRAVENOUS | Status: AC
  Administered 2016-11-07: 500 [IU] via INTRAVENOUS

## 2016-11-07 NOTE — Progress Notes (Signed)
Patient reports nausea, diarrhea and decreased appetite today. Patient has tried imodium without much relief.

## 2016-11-08 ENCOUNTER — Telehealth: Payer: Self-pay | Admitting: *Deleted

## 2016-11-08 ENCOUNTER — Inpatient Hospital Stay: Payer: Medicare HMO

## 2016-11-08 MED ORDER — DIPHENOXYLATE-ATROPINE 2.5-0.025 MG PO TABS: 1.0000 | ORAL_TABLET | Freq: Four times a day (QID) | ORAL | 1 refills | Status: DC | PRN

## 2016-11-08 NOTE — Telephone Encounter (Signed)
Diarrhea medicine did not get sent to pharmacy yesterday. Please send in and notify daughter so she knows to go pick it up at pharmacy. Patient is still having problems with diarrhea today

## 2016-11-08 NOTE — Telephone Encounter (Signed)
Rx faxed and daughter informed

## 2016-11-08 NOTE — Telephone Encounter (Signed)
Lomotil.  Thanks!

## 2016-11-17 ENCOUNTER — Other Ambulatory Visit: Payer: Self-pay | Admitting: Oncology

## 2016-11-21 ENCOUNTER — Other Ambulatory Visit: Payer: Self-pay

## 2016-11-21 ENCOUNTER — Inpatient Hospital Stay: Payer: Medicare HMO

## 2016-11-21 ENCOUNTER — Inpatient Hospital Stay (HOSPITAL_BASED_OUTPATIENT_CLINIC_OR_DEPARTMENT_OTHER): Payer: Medicare HMO | Admitting: Oncology

## 2016-11-21 VITALS — BP 120/76 | HR 80 | Temp 96.3°F | Resp 20 | Wt 176.3 lb

## 2016-11-21 DIAGNOSIS — F17210 Nicotine dependence, cigarettes, uncomplicated: Secondary | ICD-10-CM

## 2016-11-21 DIAGNOSIS — C773 Secondary and unspecified malignant neoplasm of axilla and upper limb lymph nodes: Secondary | ICD-10-CM

## 2016-11-21 DIAGNOSIS — R918 Other nonspecific abnormal finding of lung field: Secondary | ICD-10-CM | POA: Diagnosis not present

## 2016-11-21 DIAGNOSIS — E876 Hypokalemia: Secondary | ICD-10-CM | POA: Diagnosis not present

## 2016-11-21 DIAGNOSIS — Z79899 Other long term (current) drug therapy: Secondary | ICD-10-CM

## 2016-11-21 DIAGNOSIS — Z171 Estrogen receptor negative status [ER-]: Secondary | ICD-10-CM

## 2016-11-21 DIAGNOSIS — R110 Nausea: Secondary | ICD-10-CM

## 2016-11-21 DIAGNOSIS — R197 Diarrhea, unspecified: Secondary | ICD-10-CM

## 2016-11-21 DIAGNOSIS — D6481 Anemia due to antineoplastic chemotherapy: Secondary | ICD-10-CM

## 2016-11-21 DIAGNOSIS — C50411 Malignant neoplasm of upper-outer quadrant of right female breast: Secondary | ICD-10-CM

## 2016-11-21 DIAGNOSIS — R531 Weakness: Secondary | ICD-10-CM | POA: Diagnosis not present

## 2016-11-21 DIAGNOSIS — Z5111 Encounter for antineoplastic chemotherapy: Secondary | ICD-10-CM | POA: Diagnosis not present

## 2016-11-21 LAB — CBC WITH DIFFERENTIAL/PLATELET
BASOS PCT: 2 %
Basophils Absolute: 0.3 10*3/uL — ABNORMAL HIGH (ref 0–0.1)
EOS PCT: 0 %
Eosinophils Absolute: 0 10*3/uL (ref 0–0.7)
HEMATOCRIT: 35.3 % (ref 35.0–47.0)
HEMOGLOBIN: 11.9 g/dL — AB (ref 12.0–16.0)
LYMPHS ABS: 2.3 10*3/uL (ref 1.0–3.6)
Lymphocytes Relative: 15 %
MCH: 29.4 pg (ref 26.0–34.0)
MCHC: 33.8 g/dL (ref 32.0–36.0)
MCV: 86.9 fL (ref 80.0–100.0)
Monocytes Absolute: 2 10*3/uL — ABNORMAL HIGH (ref 0.2–0.9)
Monocytes Relative: 13 %
NEUTROS ABS: 11 10*3/uL — AB (ref 1.4–6.5)
Neutrophils Relative %: 70 %
Platelets: 307 10*3/uL (ref 150–440)
RBC: 4.07 MIL/uL (ref 3.80–5.20)
RDW: 14.5 % (ref 11.5–14.5)
Smear Review: ADEQUATE
WBC: 15.6 10*3/uL — ABNORMAL HIGH (ref 3.6–11.0)

## 2016-11-21 LAB — COMPREHENSIVE METABOLIC PANEL
ALK PHOS: 71 U/L (ref 38–126)
ALT: 14 U/L (ref 14–54)
ANION GAP: 9 (ref 5–15)
AST: 18 U/L (ref 15–41)
Albumin: 3 g/dL — ABNORMAL LOW (ref 3.5–5.0)
BILIRUBIN TOTAL: 0.6 mg/dL (ref 0.3–1.2)
BUN: 19 mg/dL (ref 6–20)
CALCIUM: 8.5 mg/dL — AB (ref 8.9–10.3)
CO2: 24 mmol/L (ref 22–32)
Chloride: 100 mmol/L — ABNORMAL LOW (ref 101–111)
Creatinine, Ser: 1.26 mg/dL — ABNORMAL HIGH (ref 0.44–1.00)
GFR calc Af Amer: 47 mL/min — ABNORMAL LOW (ref >60)
GFR, EST NON AFRICAN AMERICAN: 41 mL/min — AB (ref >60)
Glucose, Bld: 130 mg/dL — ABNORMAL HIGH (ref 65–99)
POTASSIUM: 3.1 mmol/L — AB (ref 3.5–5.1)
Sodium: 133 mmol/L — ABNORMAL LOW (ref 135–145)
TOTAL PROTEIN: 6.8 g/dL (ref 6.5–8.1)

## 2016-11-21 MED ORDER — TRASTUZUMAB CHEMO 150 MG IV SOLR
450.0000 mg | Freq: Once | INTRAVENOUS | Status: AC
  Administered 2016-11-21: 450 mg via INTRAVENOUS
  Filled 2016-11-21: qty 21.43

## 2016-11-21 MED ORDER — PEGFILGRASTIM 6 MG/0.6ML ~~LOC~~ PSKT
6.0000 mg | PREFILLED_SYRINGE | Freq: Once | SUBCUTANEOUS | Status: AC
  Administered 2016-11-21: 6 mg via SUBCUTANEOUS
  Filled 2016-11-21: qty 0.6

## 2016-11-21 MED ORDER — DEXAMETHASONE SODIUM PHOSPHATE 10 MG/ML IJ SOLN
10.0000 mg | Freq: Once | INTRAMUSCULAR | Status: AC
  Administered 2016-11-21: 10 mg via INTRAVENOUS
  Filled 2016-11-21: qty 1

## 2016-11-21 MED ORDER — SODIUM CHLORIDE 0.9 % IV SOLN
Freq: Once | INTRAVENOUS | Status: AC
  Administered 2016-11-21: 10:00:00 via INTRAVENOUS
  Filled 2016-11-21: qty 1000

## 2016-11-21 MED ORDER — PALONOSETRON HCL INJECTION 0.25 MG/5ML
0.2500 mg | Freq: Once | INTRAVENOUS | Status: AC
  Administered 2016-11-21: 0.25 mg via INTRAVENOUS
  Filled 2016-11-21: qty 5

## 2016-11-21 MED ORDER — CARBOPLATIN CHEMO INJECTION 600 MG/60ML
440.0000 mg | Freq: Once | INTRAVENOUS | Status: AC
  Administered 2016-11-21: 440 mg via INTRAVENOUS
  Filled 2016-11-21: qty 44

## 2016-11-21 MED ORDER — HEPARIN SOD (PORK) LOCK FLUSH 100 UNIT/ML IV SOLN
500.0000 [IU] | Freq: Once | INTRAVENOUS | Status: AC | PRN
  Administered 2016-11-21: 500 [IU]

## 2016-11-21 MED ORDER — ACETAMINOPHEN 325 MG PO TABS
650.0000 mg | ORAL_TABLET | Freq: Once | ORAL | Status: AC
  Administered 2016-11-21: 650 mg via ORAL
  Filled 2016-11-21: qty 2

## 2016-11-21 MED ORDER — DEXAMETHASONE SODIUM PHOSPHATE 100 MG/10ML IJ SOLN: 10.0000 mg | Freq: Once | INTRAMUSCULAR | Status: DC

## 2016-11-21 MED ORDER — DIPHENHYDRAMINE HCL 25 MG PO CAPS
25.0000 mg | ORAL_CAPSULE | Freq: Once | ORAL | Status: AC
  Administered 2016-11-21: 25 mg via ORAL
  Filled 2016-11-21: qty 1

## 2016-11-21 MED ORDER — DOCETAXEL CHEMO INJECTION 160 MG/16ML
140.0000 mg | Freq: Once | INTRAVENOUS | Status: AC
  Administered 2016-11-21: 140 mg via INTRAVENOUS
  Filled 2016-11-21: qty 14

## 2016-11-21 MED ORDER — DEXAMETHASONE 4 MG PO TABS: 4.0000 mg | ORAL_TABLET | Freq: Two times a day (BID) | ORAL | 0 refills | Status: DC

## 2016-11-21 MED ORDER — SODIUM CHLORIDE 0.9 % IV SOLN
420.0000 mg | Freq: Once | INTRAVENOUS | Status: AC
  Administered 2016-11-21: 420 mg via INTRAVENOUS
  Filled 2016-11-21: qty 14

## 2016-11-21 NOTE — Progress Notes (Addendum)
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  INTERVAL HISTORY: Patient returns to clinic today for further evaluation and to assess her toleration of cycle 2 of 6 of Taxotere, carboplatinum, Herceptin, and Perjeta. Nausea is much better. She no longer has diarrhea. She continues to take her Lomotil. Her biggest complaint is severe weakness and fatigue. She has no neurologic complaints. She denies any recent fevers or illnesses. She has no chest pain or shortness of breath.  She has no urinary complaints. Patient feels Better than last week. She offers no further specific complaints today.  REVIEW OF SYSTEMS:   Review of Systems  Constitutional: Positive for malaise/fatigue and weight loss. Negative for fever.  Respiratory: Negative.  Negative for cough and shortness of breath.   Cardiovascular: Negative.  Negative for chest pain and leg swelling.  Gastrointestinal: Positive for nausea. Negative for abdominal pain.  Genitourinary: Negative.   Musculoskeletal: Negative.   Skin: Negative.  Negative for rash.  Neurological: Positive for weakness.  Psychiatric/Behavioral: The patient is nervous/anxious.     As per HPI. Otherwise, a complete review of systems is negative.  PAST MEDICAL HISTORY: Past Medical History:  Diagnosis Date  . Anxiety   . Arthritis   . Cancer [facility])    Right Breast Cancer  . COPD (chronic obstructive pulmonary disease) ([place])   . Dyspnea    with exertion  . GERD (gastroesophageal reflux disease)   . Hypertension     PAST SURGICAL HISTORY: Past Surgical History:  Procedure Laterality Date  . ABDOMINAL HYSTERECTOMY  1990   Partial  . BREAST BIOPSY  Right 10/04/2016   axilla lymph node and axillay tail mass biopsy. Path pending  . DILATION AND CURETTAGE OF UTERUS    . PORTACATH PLACEMENT Left 10/24/2016   Procedure: INSERTION PORT-A-CATH;  Surgeon: [name], MD;  Location: [facility] ORS;  Service: General;  Laterality: Left;    FAMILY HISTORY: Family History  Problem Relation Age of Onset  . Cancer Mother   . Breast cancer Neg Hx     ADVANCED DIRECTIVES (Y/N):  N  HEALTH MAINTENANCE: Social History  Substance Use Topics  . Smoking status: Former Smoker    Packs/day: 0.50    Types: Cigarettes    Quit date: 07/23/1988  . Smokeless tobacco: Never Used  . Alcohol use No     Colonoscopy:  PAP:  Bone density:  Lipid panel:  Allergies  Allergen Reactions  . Atorvastatin Other (See Comments)    Muscle Pain  . Gabapentin Swelling  . Penicillins Swelling    Current Outpatient Prescriptions  Medication Sig Dispense Refill  . ADVAIR DISKUS 500-50 MCG/DOSE AEPB Inhale 1 puff into the lungs 2 (two) times daily.     Marland Kitchen ALPRAZolam (XANAX) 0.25 MG tablet Take 1 tablet (0.25 mg total) by mouth at bedtime as needed for anxiety. 30 tablet 0  . baclofen (LIORESAL) 10 MG tablet Take 10 mg by mouth at bedtime.     Marland Kitchen co-enzyme Q-10 30 MG capsule Take 30 mg by mouth daily.    . diphenoxylate-atropine (LOMOTIL) 2.5-0.025 MG tablet Take 1 tablet by mouth 4 (four) times daily as needed for diarrhea or loose stools. 30 tablet 1  . DULoxetine (CYMBALTA)  30 MG capsule Take 30 mg by mouth daily.    . DULoxetine (CYMBALTA) 60 MG capsule Take 1 capsule (60 mg total) by mouth daily. 30 capsule 0  . ipratropium (ATROVENT HFA) 17 MCG/ACT inhaler Inhale 2 puffs into the lungs 3 (three) times daily.    Marland Kitchen lidocaine-prilocaine (EMLA) cream Apply to affected area once 30 g 3  . losartan-hydrochlorothiazide (HYZAAR) 100-25 MG tablet Take 1 tablet by mouth daily.     . montelukast (SINGULAIR) 10 MG tablet Take 10 mg by mouth daily.     . Omega-3 Fatty Acids  (FISH OIL PO) Take 1 capsule by mouth daily.     Marland Kitchen omeprazole (PRILOSEC) 20 MG capsule Take 20 mg by mouth daily.    . ondansetron (ZOFRAN) 8 MG tablet Take 1 tablet (8 mg total) by mouth 2 (two) times daily as needed for refractory nausea / vomiting. 60 tablet 2  . Potassium Chloride ER 20 MEQ TBCR Take 1 tablet by mouth daily.     . prochlorperazine (COMPAZINE) 10 MG tablet Take 1 tablet (10 mg total) by mouth every 6 (six) hours as needed (Nausea or vomiting). 60 tablet 2  . promethazine (PHENERGAN) 25 MG tablet TAKE 1 TABLET BY MOUTH EVERY 6-8 HOURS AS NEEDED FOR NAUSEA  2   No current facility-administered medications for this visit.     OBJECTIVE: Vitals:   11/21/16 0901  BP: 120/76  Pulse: 80  Resp: 20  Temp: (!) 96.3 F (35.7 C)     Body mass index is 32.25 kg/m.    ECOG FS:0 - Asymptomatic  General: Well-developed, well-nourished, no acute distress. Eyes: Pink conjunctiva, anicteric sclera. Breasts: Not assess today. Lungs: Clear to auscultation bilaterally. Heart: Regular rate and rhythm. No rubs, murmurs, or gallops. Abdomen: Soft, nontender, nondistended. No organomegaly noted, normoactive bowel sounds. Musculoskeletal: No edema, cyanosis, or clubbing. Neuro: Alert, answering all questions appropriately. Cranial nerves grossly intact. Skin: No rashes or petechiae noted. Psych: Normal affect.   LAB RESULTS:  Lab Results  Component Value Date   NA 133 (L) 11/21/2016   K 3.1 (L) 11/21/2016   CL 100 (L) 11/21/2016   CO2 24 11/21/2016   GLUCOSE 130 (H) 11/21/2016   BUN 19 11/21/2016   CREATININE 1.26 (H) 11/21/2016   CALCIUM 8.5 (L) 11/21/2016   PROT 6.8 11/21/2016   ALBUMIN 3.0 (L) 11/21/2016   AST 18 11/21/2016   ALT 14 11/21/2016   ALKPHOS 71 11/21/2016   BILITOT 0.6 11/21/2016   GFRNONAA 41 (L) 11/21/2016   GFRAA 47 (L) 11/21/2016    Lab Results  Component Value Date   WBC 15.6 (H) 11/21/2016   NEUTROABS 11.0 (H) 11/21/2016   HGB 11.9 (L)  11/21/2016   HCT 35.3 11/21/2016   MCV 86.9 11/21/2016   PLT 307 11/21/2016     STUDIES: Dg Chest Port 1 View  Result Date: 10/24/2016 CLINICAL DATA:  Post Port a catheter insertion. History of COPD and hypertension. Former smoker. EXAM: PORTABLE CHEST 1 VIEW COMPARISON:  10/20/2016; 04/19/2015 FINDINGS: Grossly unchanged cardiac silhouette and mediastinal contours with atherosclerotic plaque within thoracic aorta. Interval placement of left anterior chest wall subclavian vein approach port a catheter with tip projected over the superior cavoatrial junction. The lungs remain hyperexpanded with flattening of the diaphragms and thinning of the biapical pulmonary parenchyma. Slight worsening of bilateral mid and lower lung heterogeneous opacities favored to represent atelectasis. No definite pleural effusion. No evidence of edema. No acute osseus abnormalities. IMPRESSION: 1.  Uncomplicated placement of left subclavian vein approach port a catheter with tip projected over the superior cavoatrial junction. Specifically, no evidence of pneumothorax. 2. Worsening bibasilar atelectasis without superimposed acute cardiopulmonary disease. 3. Aortic Atherosclerosis (ICD10-I70.0) and Emphysema (ICD10-J43.9). Electronically Signed   By: Sandi Mariscal M.D.   On: 10/24/2016 09:15   Dg C-arm 1-60 Min-no Report  Result Date: 10/24/2016 Fluoroscopy was utilized by the requesting physician.  No radiographic interpretation.    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  PLAN:    1. 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 Patient will require Neulasta support. Pretreatment MUGA scan revealed an EF of  68%. Repeat in October 2018. Patient had a difficult time with cycle 1 of 6 of Taxotere, carboplatinum, Herceptin, and Perjeta and will consider dose reducing for subsequent cycles. Patient will also require a total of one year of Herceptin. At the conclusion of cycle 6 of chemotherapy, she will be referred to surgery for lumpectomy. Patient will require adjuvant XRT if she has a lumpectomy. Finally, given the PR positivity of her tumor she will benefit from an aromatase inhibitor for 5 years. Proceed with treatment today. Return to clinic on in 3 weeks for consideration of cycle 3. 2. Nausea: She will receive nausea medications with treatment today. Continue other nausea medications at home. Return on Friday for fluids and nausea medications.  3. Diarrhea: Continue Lomotil as needed.  4. Weakness and fatigue: She will have 1 L of IV fluids administered on Friday. RX 4 mg Decadron for one week.  5. Hypokalemia: Continue potassium supplements. 6. Anemia: Chemo induced. Continue to monitor.  Approximately 30 minutes was spent in discussion of which greater than 50% was consultation.   Patient expressed understanding and was in agreement with this plan. She also understands that She can call clinic at any time with any questions, concerns, or complaints.   Cancer Staging Malignant neoplasm of upper outer quadrant of female breast [facility]) Staging form: Breast, AJCC 8th Edition - Clinical stage from 10/16/2016: Stage IIB (cT2, cN1, cM0, G3, ER: Negative, PR: Positive, HER2: Positive) - Signed by Lloyd Huger, MD on 10/16/2016   Jacquelin Hawking, NP   11/21/2016 9:15 AM

## 2016-11-21 NOTE — Progress Notes (Signed)
Patient reports weakness today, decreased appetite.

## 2016-11-23 ENCOUNTER — Other Ambulatory Visit: Payer: Self-pay | Admitting: *Deleted

## 2016-11-23 DIAGNOSIS — E860 Dehydration: Secondary | ICD-10-CM

## 2016-11-23 MED ORDER — SODIUM CHLORIDE 0.9 % IV SOLN
Freq: Once | INTRAVENOUS | Status: DC
  Filled 2016-11-23: qty 1000

## 2016-11-23 MED ORDER — SODIUM CHLORIDE 0.9 % IV SOLN
Freq: Once | INTRAVENOUS | Status: DC
  Filled 2016-11-23: qty 4

## 2016-11-24 ENCOUNTER — Inpatient Hospital Stay: Payer: Medicare HMO | Attending: Oncology

## 2016-11-24 VITALS — BP 117/69 | HR 85 | Temp 96.8°F | Resp 20

## 2016-11-24 DIAGNOSIS — J449 Chronic obstructive pulmonary disease, unspecified: Secondary | ICD-10-CM | POA: Diagnosis not present

## 2016-11-24 DIAGNOSIS — R21 Rash and other nonspecific skin eruption: Secondary | ICD-10-CM | POA: Insufficient documentation

## 2016-11-24 DIAGNOSIS — C50419 Malignant neoplasm of upper-outer quadrant of unspecified female breast: Secondary | ICD-10-CM

## 2016-11-24 DIAGNOSIS — Z171 Estrogen receptor negative status [ER-]: Secondary | ICD-10-CM | POA: Diagnosis not present

## 2016-11-24 DIAGNOSIS — F419 Anxiety disorder, unspecified: Secondary | ICD-10-CM | POA: Insufficient documentation

## 2016-11-24 DIAGNOSIS — C50411 Malignant neoplasm of upper-outer quadrant of right female breast: Secondary | ICD-10-CM | POA: Diagnosis not present

## 2016-11-24 DIAGNOSIS — R634 Abnormal weight loss: Secondary | ICD-10-CM | POA: Diagnosis not present

## 2016-11-24 DIAGNOSIS — Z5111 Encounter for antineoplastic chemotherapy: Secondary | ICD-10-CM | POA: Insufficient documentation

## 2016-11-24 DIAGNOSIS — Z7689 Persons encountering health services in other specified circumstances: Secondary | ICD-10-CM | POA: Insufficient documentation

## 2016-11-24 DIAGNOSIS — E876 Hypokalemia: Secondary | ICD-10-CM | POA: Diagnosis not present

## 2016-11-24 DIAGNOSIS — R110 Nausea: Secondary | ICD-10-CM | POA: Diagnosis not present

## 2016-11-24 DIAGNOSIS — Z79899 Other long term (current) drug therapy: Secondary | ICD-10-CM | POA: Insufficient documentation

## 2016-11-24 DIAGNOSIS — K219 Gastro-esophageal reflux disease without esophagitis: Secondary | ICD-10-CM | POA: Insufficient documentation

## 2016-11-24 DIAGNOSIS — E860 Dehydration: Secondary | ICD-10-CM | POA: Insufficient documentation

## 2016-11-24 DIAGNOSIS — E871 Hypo-osmolality and hyponatremia: Secondary | ICD-10-CM | POA: Diagnosis not present

## 2016-11-24 DIAGNOSIS — I10 Essential (primary) hypertension: Secondary | ICD-10-CM | POA: Diagnosis not present

## 2016-11-24 DIAGNOSIS — Z87891 Personal history of nicotine dependence: Secondary | ICD-10-CM | POA: Diagnosis not present

## 2016-11-24 DIAGNOSIS — R197 Diarrhea, unspecified: Secondary | ICD-10-CM | POA: Insufficient documentation

## 2016-11-24 MED ORDER — SODIUM CHLORIDE 0.9 % IV SOLN
Freq: Once | INTRAVENOUS | Status: AC
  Administered 2016-11-24: 12:00:00 via INTRAVENOUS
  Filled 2016-11-24: qty 1000

## 2016-11-24 MED ORDER — HEPARIN SOD (PORK) LOCK FLUSH 100 UNIT/ML IV SOLN
500.0000 [IU] | Freq: Once | INTRAVENOUS | Status: AC
  Administered 2016-11-24: 500 [IU] via INTRAVENOUS
  Filled 2016-11-24: qty 5

## 2016-11-24 MED ORDER — SODIUM CHLORIDE 0.9 % IV SOLN
Freq: Once | INTRAVENOUS | Status: AC
  Administered 2016-11-24: 12:00:00 via INTRAVENOUS
  Filled 2016-11-24: qty 50

## 2016-11-24 MED ORDER — SODIUM CHLORIDE 0.9% FLUSH
10.0000 mL | Freq: Once | INTRAVENOUS | Status: AC
  Administered 2016-11-24: 10 mL via INTRAVENOUS
  Filled 2016-11-24: qty 10

## 2016-11-29 ENCOUNTER — Inpatient Hospital Stay: Payer: Medicare HMO

## 2016-11-29 ENCOUNTER — Inpatient Hospital Stay (HOSPITAL_BASED_OUTPATIENT_CLINIC_OR_DEPARTMENT_OTHER): Payer: Medicare HMO | Admitting: Oncology

## 2016-11-29 VITALS — Temp 98.5°F | Wt 169.2 lb

## 2016-11-29 DIAGNOSIS — R110 Nausea: Secondary | ICD-10-CM

## 2016-11-29 DIAGNOSIS — Z79899 Other long term (current) drug therapy: Secondary | ICD-10-CM

## 2016-11-29 DIAGNOSIS — C50411 Malignant neoplasm of upper-outer quadrant of right female breast: Secondary | ICD-10-CM

## 2016-11-29 DIAGNOSIS — R21 Rash and other nonspecific skin eruption: Secondary | ICD-10-CM

## 2016-11-29 DIAGNOSIS — R634 Abnormal weight loss: Secondary | ICD-10-CM

## 2016-11-29 DIAGNOSIS — C50419 Malignant neoplasm of upper-outer quadrant of unspecified female breast: Secondary | ICD-10-CM

## 2016-11-29 DIAGNOSIS — E871 Hypo-osmolality and hyponatremia: Secondary | ICD-10-CM

## 2016-11-29 DIAGNOSIS — R197 Diarrhea, unspecified: Secondary | ICD-10-CM

## 2016-11-29 DIAGNOSIS — Z5111 Encounter for antineoplastic chemotherapy: Secondary | ICD-10-CM | POA: Diagnosis not present

## 2016-11-29 DIAGNOSIS — D860 Sarcoidosis of lung: Secondary | ICD-10-CM

## 2016-11-29 DIAGNOSIS — Z171 Estrogen receptor negative status [ER-]: Secondary | ICD-10-CM

## 2016-11-29 DIAGNOSIS — E876 Hypokalemia: Secondary | ICD-10-CM

## 2016-11-29 LAB — COMPREHENSIVE METABOLIC PANEL
ALBUMIN: 3.3 g/dL — AB (ref 3.5–5.0)
ALT: 25 U/L (ref 14–54)
ANION GAP: 11 (ref 5–15)
AST: 33 U/L (ref 15–41)
Alkaline Phosphatase: 91 U/L (ref 38–126)
BUN: 20 mg/dL (ref 6–20)
CHLORIDE: 99 mmol/L — AB (ref 101–111)
CO2: 23 mmol/L (ref 22–32)
Calcium: 9 mg/dL (ref 8.9–10.3)
Creatinine, Ser: 1.21 mg/dL — ABNORMAL HIGH (ref 0.44–1.00)
GFR calc Af Amer: 49 mL/min — ABNORMAL LOW (ref >60)
GFR calc non Af Amer: 43 mL/min — ABNORMAL LOW (ref >60)
GLUCOSE: 117 mg/dL — AB (ref 65–99)
POTASSIUM: 3.2 mmol/L — AB (ref 3.5–5.1)
SODIUM: 133 mmol/L — AB (ref 135–145)
Total Bilirubin: 0.3 mg/dL (ref 0.3–1.2)
Total Protein: 7 g/dL (ref 6.5–8.1)

## 2016-11-29 LAB — CBC WITH DIFFERENTIAL/PLATELET
BASOS PCT: 0 %
Basophils Absolute: 0 10*3/uL (ref 0–0.1)
EOS ABS: 0 10*3/uL (ref 0–0.7)
EOS PCT: 0 %
HCT: 37.7 % (ref 35.0–47.0)
Hemoglobin: 12.4 g/dL (ref 12.0–16.0)
Lymphocytes Relative: 19 %
Lymphs Abs: 2.7 10*3/uL (ref 1.0–3.6)
MCH: 28.7 pg (ref 26.0–34.0)
MCHC: 32.9 g/dL (ref 32.0–36.0)
MCV: 87.3 fL (ref 80.0–100.0)
MONO ABS: 1.3 10*3/uL — AB (ref 0.2–0.9)
MONOS PCT: 9 %
Neutro Abs: 10.1 10*3/uL — ABNORMAL HIGH (ref 1.4–6.5)
Neutrophils Relative %: 72 %
PLATELETS: 243 10*3/uL (ref 150–440)
RBC: 4.32 MIL/uL (ref 3.80–5.20)
RDW: 14.6 % — AB (ref 11.5–14.5)
WBC: 14.1 10*3/uL — ABNORMAL HIGH (ref 3.6–11.0)

## 2016-11-29 LAB — MAGNESIUM: Magnesium: 1.2 mg/dL — ABNORMAL LOW (ref 1.7–2.4)

## 2016-11-29 MED ORDER — MAGNESIUM SULFATE 4 GM/100ML IV SOLN
4.0000 g | Freq: Once | INTRAVENOUS | Status: AC
  Administered 2016-11-29: 4 g via INTRAVENOUS
  Filled 2016-11-29: qty 100

## 2016-11-29 MED ORDER — SODIUM CHLORIDE 0.9 % IV SOLN
Freq: Once | INTRAVENOUS | Status: AC
  Administered 2016-11-29: 13:00:00 via INTRAVENOUS
  Filled 2016-11-29: qty 1000

## 2016-11-29 MED ORDER — ONDANSETRON 8 MG PO TBDP
8.0000 mg | ORAL_TABLET | Freq: Once | ORAL | Status: AC
  Administered 2016-11-29: 8 mg via ORAL
  Filled 2016-11-29: qty 1

## 2016-11-29 MED ORDER — SODIUM CHLORIDE 0.9 % IV SOLN
INTRAVENOUS | Status: DC
  Administered 2016-11-29: 13:00:00 via INTRAVENOUS
  Filled 2016-11-29: qty 1000

## 2016-11-29 MED ORDER — SODIUM CHLORIDE 0.9 % IV SOLN: 4.0000 g | Freq: Once | INTRAVENOUS | Status: DC

## 2016-11-29 MED ORDER — DEXAMETHASONE SODIUM PHOSPHATE 10 MG/ML IJ SOLN
10.0000 mg | Freq: Once | INTRAMUSCULAR | Status: AC
  Administered 2016-11-29: 10 mg via INTRAVENOUS
  Filled 2016-11-29: qty 1

## 2016-11-29 MED ORDER — SODIUM CHLORIDE 0.9 % IV SOLN: Freq: Once | INTRAVENOUS | Status: DC

## 2016-11-29 MED ORDER — TRIAMCINOLONE ACETONIDE 0.5 % EX OINT: 1.0000 "application " | TOPICAL_OINTMENT | Freq: Two times a day (BID) | CUTANEOUS | 0 refills | Status: DC

## 2016-11-29 NOTE — Progress Notes (Signed)
Patient here today for an acute visit.  Patient has rash all over her arms, started 3 days ago, diarrhea 4-5 times daily

## 2016-11-29 NOTE — Progress Notes (Signed)
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 Patient will require Neulasta support. Pretreatment MUGA scan revealed an EF of 68%. Repeat in October 2018. Patient had a difficult time with cycle 1 of 6 of Taxotere, carboplatinum, Herceptin, and Perjeta and will consider dose reducing for subsequent cycles. Patient will also require a total of one year of Herceptin. At the conclusion of cycle 6 of chemotherapy, she will be referred to surgery for lumpectomy. Patient will require adjuvant XRT if she has a lumpectomy. Finally, given the PR positivity of her tumor she will benefit from an aromatase inhibitor for 5 years.  Interval history- Patient presents today for diarrhea that is unrelieved with Lomotil, rash on bilateral upper extremities, nausea and lack of appetite. The diarrhea began Saturday and is very loose. She is having 2-3 loose BM's per day. She states after each treatment she has diarrhea but this time it is worse. She continues to have nausea that is mostly relieved with antiemetics prescribed. She is unable to eat when she is nauseated. She also has developed a rash on bilateral upper extremities that is pruritic in nature. The rash started on Monday. She Denies any shortness of breath,  constipation, chest pain or urinary issues.  ECOG FS:1 - Symptomatic but completely ambulatory  Review of systems- Review of Systems  Constitutional: Positive for malaise/fatigue and weight loss. Negative for chills, diaphoresis and fever.  HENT: Negative.   Eyes: Negative.   Respiratory: Negative for shortness of breath and wheezing.   Cardiovascular: Negative.  Negative for chest pain and leg swelling.  Gastrointestinal: Positive for diarrhea and nausea. Negative for abdominal pain, constipation and vomiting.  Genitourinary: Negative.   Musculoskeletal: Negative.   Skin: Positive for rash.       On bilateral upper extremities.  Neurological: Positive for weakness.  Endo/Heme/Allergies: Negative.   Psychiatric/Behavioral: Negative.      Current treatment- Last treatment was on 11/21/16. She received two cycles (10/31/16 and 11/21/16) of neoadjuvant chemotherapy using Taxotere, carboplatinum, Herceptin, and Perjeta.   Allergies  Allergen Reactions  . Atorvastatin Other (See Comments)    Muscle Pain  . Gabapentin Swelling  . Penicillins Swelling     Past Medical History:  Diagnosis Date  . Anxiety   . Arthritis   . Cancer [facility])    Right Breast Cancer  . COPD (chronic obstructive pulmonary disease) ([place])   . Dyspnea    with exertion  . GERD (gastroesophageal reflux disease)   . Hypertension      Past Surgical History:  Procedure Laterality Date  . ABDOMINAL HYSTERECTOMY  1990   Partial  . BREAST BIOPSY Right 10/04/2016   axilla lymph node and axillay tail mass biopsy. Path pending  . DILATION AND CURETTAGE OF UTERUS    . PORTACATH PLACEMENT Left 10/24/2016   Procedure: INSERTION PORT-A-CATH;  Surgeon: [name],  MD;  Location: [facility] ORS;  Service: General;  Laterality: Left;    Social History   Social History  . Marital status: Widowed    Spouse name: N/A  . Number of children: N/A  . Years of education: N/A   Occupational History  . Not on file.    Social History Main Topics  . Smoking status: Former Smoker    Packs/day: 0.50    Types: Cigarettes    Quit date: 07/23/1988  . Smokeless tobacco: Never Used  . Alcohol use No  . Drug use: No  . Sexual activity: Not on file   Other Topics Concern  . Not on file   Social History Narrative  . No narrative on file    Family History  Problem Relation Age of Onset  . Cancer Mother   . Breast cancer Neg Hx      Current Outpatient Prescriptions:  .  ADVAIR DISKUS 500-50 MCG/DOSE AEPB, Inhale 1 puff into the lungs 2 (two) times daily. , Disp: , Rfl:  .  ALPRAZolam (XANAX) 0.25 MG tablet, Take 1 tablet (0.25 mg total) by mouth at bedtime as needed for anxiety., Disp: 30 tablet, Rfl: 0 .  baclofen (LIORESAL) 10 MG tablet, Take 10 mg by mouth at bedtime. , Disp: , Rfl:  .  co-enzyme Q-10 30 MG capsule, Take 30 mg by mouth daily., Disp: , Rfl:  .  diphenoxylate-atropine (LOMOTIL) 2.5-0.025 MG tablet, Take 1 tablet by mouth 4 (four) times daily as needed for diarrhea or loose stools., Disp: 30 tablet, Rfl: 1 .  DULoxetine (CYMBALTA) 60 MG capsule, Take 1 capsule (60 mg total) by mouth daily., Disp: 30 capsule, Rfl: 0 .  ipratropium (ATROVENT HFA) 17 MCG/ACT inhaler, Inhale 2 puffs into the lungs 3 (three) times daily., Disp: , Rfl:  .  lidocaine-prilocaine (EMLA) cream, Apply to affected area once, Disp: 30 g, Rfl: 3 .  losartan-hydrochlorothiazide (HYZAAR) 100-25 MG tablet, Take 1 tablet by mouth daily. , Disp: , Rfl:  .  montelukast (SINGULAIR) 10 MG tablet, Take 10 mg by mouth daily. , Disp: , Rfl:  .  Omega-3 Fatty Acids (FISH OIL PO), Take 1 capsule by mouth daily. , Disp: , Rfl:  .  omeprazole (PRILOSEC) 20 MG capsule, Take 20 mg by mouth daily., Disp: , Rfl:  .  ondansetron (ZOFRAN) 8 MG tablet, Take 1 tablet (8 mg total) by mouth 2 (two) times daily as needed for refractory nausea / vomiting., Disp: 60 tablet, Rfl: 2 .  Potassium Chloride ER 20 MEQ TBCR, Take 1 tablet by mouth  daily. , Disp: , Rfl:  .  prochlorperazine (COMPAZINE) 10 MG tablet, Take 1 tablet (10 mg total) by mouth every 6 (six) hours as needed (Nausea or vomiting)., Disp: 60 tablet, Rfl: 2 .  promethazine (PHENERGAN) 25 MG tablet, TAKE 1 TABLET BY MOUTH EVERY 6-8 HOURS AS NEEDED FOR NAUSEA, Disp: , Rfl: 2 .  DULoxetine (CYMBALTA) 30 MG capsule, Take 30 mg by mouth daily., Disp: , Rfl:  .  triamcinolone ointment (KENALOG) 0.5 %, Apply 1 application topically 2 (two) times daily. Rash on bilateral upper extremities, Disp: 30 g, Rfl: 0  Physical exam:  Vitals:   11/29/16 1141  Temp: 98.5 F (36.9 C)  TempSrc: Tympanic  Weight: 169 lb 3 oz (76.7 kg)   Physical Exam  Constitutional: She is oriented to person, place, and time and well-developed, well-nourished, and in no distress. Vital signs are normal. She appears dehydrated.  HENT:  Head: Normocephalic and atraumatic.  Eyes: Pupils are equal, round, and reactive to light.  Neck: Normal range of motion. Neck supple.  Cardiovascular: Normal rate and regular rhythm.   Pulmonary/Chest: Effort normal and breath sounds normal.  Abdominal: Soft. Normal appearance and bowel sounds are normal.  Musculoskeletal: Normal range of motion.  Neurological: She is alert and oriented to person, place, and time. Gait normal.  Skin: Skin is warm, dry and intact. Rash noted. Rash is maculopapular. There is pallor.     Psychiatric: Mood, memory, affect and judgment normal.     CMP Latest Ref Rng & Units 11/29/2016  Glucose 65 - 99 mg/dL 117(H)  BUN 6 - 20 mg/dL 20  Creatinine 0.44 - 1.00 mg/dL 1.21(H)  Sodium 135 - 145 mmol/L 133(L)  Potassium 3.5 - 5.1 mmol/L 3.2(L)  Chloride 101 - 111 mmol/L 99(L)  CO2 22 - 32 mmol/L 23  Calcium 8.9 - 10.3 mg/dL 9.0  Total Protein 6.5 - 8.1 g/dL 7.0  Total Bilirubin 0.3 - 1.2 mg/dL 0.3  Alkaline Phos 38 - 126 U/L 91  AST 15 - 41 U/L 33  ALT 14 - 54 U/L 25   CBC Latest Ref Rng & Units 11/29/2016  WBC 3.6 - 11.0 K/uL  14.1(H)  Hemoglobin 12.0 - 16.0 g/dL 12.4  Hematocrit 35.0 - 47.0 % 37.7  Platelets 150 - 440 K/uL 243    No images are attached to the encounter.  No results found.   Assessment and plan- Patient is a 76 y.o. female who presents with diarrhea unrelived by Lomotil, nausea and a new rash on bilateral upper extremities since Monday. She appears pale and weak. Her oral mucous membranes are dry. Rash on bilateral upper extremities is macropapular in nature and extends from wrist to arm-pit. It is warm to the touch. Her sodium is 133, Potassium 3.2, Magnesium 1.2 and WBC 14.1.   1. Dehydration/Hyponatremia: I liter Normal Saline today.  2. Hypokalemia: Give 20 Meq Potassium IV today.  3. Hypomagnesemia: Give 4 mg Mag IV today.  4. Rash on bilateral upper extremities: RX Kenalog cream TID. OTC Benadryl PRN. 5. Diarrhea: continue Lomotil as prescribed. Spoke with Dr. Grayland Ormond about possible dose reduction for next cycle. If this continues, we will need a stool sample. Asked her to try and get a stool specimen at home and bring back to laboratory testing. 6. Nausea: Continue antiemetics.  7. Weight Loss: Encourage supplements such as ensure or boost. Encourage her to drink plenty of water.    Visit Diagnosis 1. Diarrhea, unspecified type   2. Malignant neoplasm of upper-outer quadrant of female breast, unspecified estrogen receptor status, unspecified laterality ([place])   3. Nausea without vomiting     Patient expressed understanding and was in agreement with this plan. She also understands that She can call clinic at any time with any questions, concerns, or complaints.    Marisue Humble [facility] Pager- [phone] 12/01/2016 3:52 PM

## 2016-12-07 ENCOUNTER — Other Ambulatory Visit: Payer: Self-pay | Admitting: Oncology

## 2016-12-07 NOTE — Progress Notes (Signed)
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  INTERVAL HISTORY: Patient returns to clinic today for further evaluation and consideration of cycle 3 of 6 of Taxotere, carboplatinum, Herceptin, and Perjeta. She continues to have weakness and fatigue, but states her diarrhea and nausea have resolved. She continues to have a poor appetite. She has increased weakness and fatigue. She has no neurologic complaints. She denies any recent fevers or illnesses. She has no chest pain or shortness of breath.  She has no urinary complaints. Patient offers no further specific complaints today.  REVIEW OF SYSTEMS:   Review of Systems  Constitutional: Positive for malaise/fatigue. Negative for fever and weight loss.  Respiratory: Negative.  Negative for cough and shortness of breath.   Cardiovascular: Negative.  Negative for chest pain and leg swelling.  Gastrointestinal: Negative for abdominal pain, diarrhea and nausea.  Genitourinary: Negative.   Musculoskeletal: Negative.   Skin: Negative.  Negative for rash.  Neurological: Positive for weakness.  Psychiatric/Behavioral: The patient is nervous/anxious.     As per HPI. Otherwise, a complete review of systems is negative.  PAST MEDICAL HISTORY: Past Medical History:  Diagnosis Date  . Anxiety   . Arthritis   . Cancer [facility])    Right Breast Cancer  . COPD (chronic obstructive pulmonary disease) ([place])   . Dyspnea    with exertion  . GERD (gastroesophageal reflux disease)   . Hypertension     PAST SURGICAL HISTORY: Past Surgical History:  Procedure Laterality Date  . ABDOMINAL HYSTERECTOMY  1990   Partial  . BREAST BIOPSY Right  10/04/2016   axilla lymph node and axillay tail mass biopsy. Path pending  . DILATION AND CURETTAGE OF UTERUS    . PORTACATH PLACEMENT Left 10/24/2016   Procedure: INSERTION PORT-A-CATH;  Surgeon: [name], MD;  Location: [facility] ORS;  Service: General;  Laterality: Left;    FAMILY HISTORY: Family History  Problem Relation Age of Onset  . Colon cancer Mother   . Breast cancer Neg Hx     ADVANCED DIRECTIVES (Y/N):  N  HEALTH MAINTENANCE: Social History  Substance Use Topics  . Smoking status: Former Smoker    Packs/day: 0.50    Types: Cigarettes    Quit date: 07/23/1988  . Smokeless tobacco: Never Used  . Alcohol use No     Colonoscopy:  PAP:  Bone density:  Lipid panel:  Allergies  Allergen Reactions  . Atorvastatin Other (See Comments)    Muscle Pain  . Gabapentin Swelling  . Penicillins Swelling    Current Outpatient Prescriptions  Medication Sig Dispense Refill  . ADVAIR DISKUS 500-50 MCG/DOSE AEPB Inhale 1 puff into the lungs 2 (two) times daily.     Marland Kitchen ALPRAZolam (XANAX) 0.25 MG tablet Take 1 tablet (0.25 mg total) by mouth at bedtime as needed for anxiety. 30 tablet 0  . baclofen (LIORESAL) 10 MG tablet Take 10 mg by mouth at bedtime.     Marland Kitchen co-enzyme Q-10 30 MG capsule Take 30 mg by mouth daily.    . diphenoxylate-atropine (LOMOTIL) 2.5-0.025 MG tablet Take 1 tablet by mouth 4 (four) times daily as needed for diarrhea or loose stools. 30 tablet 1  . DULoxetine (CYMBALTA) 60 MG capsule  Take 1 capsule (60 mg total) by mouth daily. 30 capsule 0  . lidocaine-prilocaine (EMLA) cream Apply to affected area once 30 g 3  . losartan-hydrochlorothiazide (HYZAAR) 100-25 MG tablet Take 1 tablet by mouth daily.     . montelukast (SINGULAIR) 10 MG tablet Take 10 mg by mouth daily.     . Omega-3 Fatty Acids (FISH OIL PO) Take 1 capsule by mouth daily.     Marland Kitchen omeprazole (PRILOSEC) 20 MG capsule Take 20 mg by mouth daily.    . ondansetron (ZOFRAN) 8 MG tablet Take 1 tablet (8 mg  total) by mouth 2 (two) times daily as needed for refractory nausea / vomiting. 60 tablet 2  . Potassium Chloride ER 20 MEQ TBCR Take 1 tablet by mouth daily.     . prochlorperazine (COMPAZINE) 10 MG tablet Take 1 tablet (10 mg total) by mouth every 6 (six) hours as needed (Nausea or vomiting). 60 tablet 2  . promethazine (PHENERGAN) 25 MG tablet TAKE 1 TABLET BY MOUTH EVERY 6-8 HOURS AS NEEDED FOR NAUSEA  2  . triamcinolone ointment (KENALOG) 0.5 % Apply 1 application topically 2 (two) times daily. Rash on bilateral upper extremities 30 g 0   No current facility-administered medications for this visit.    Facility-Administered Medications Ordered in Other Visits  Medication Dose Route Frequency Provider Last Rate Last Dose  . [START ON 12/15/2016] 0.9 %  sodium chloride infusion   Intravenous Once Lloyd Huger, MD      . Derrill Memo ON 12/20/2016] 0.9 %  sodium chloride infusion   Intravenous Once Lloyd Huger, MD        OBJECTIVE: Vitals:   12/12/16 0912  BP: 108/73  Pulse: 85  Resp: 18  Temp: 98.3 F (36.8 C)     Body mass index is 31.48 kg/m.    ECOG FS:0 - Asymptomatic  General: Well-developed, well-nourished, no acute distress. Eyes: Pink conjunctiva, anicteric sclera. Breasts: Easily palpable right breast mass. Lungs: Clear to auscultation bilaterally. Heart: Regular rate and rhythm. No rubs, murmurs, or gallops. Abdomen: Soft, nontender, nondistended. No organomegaly noted, normoactive bowel sounds. Musculoskeletal: No edema, cyanosis, or clubbing. Neuro: Alert, answering all questions appropriately. Cranial nerves grossly intact. Skin: No rashes or petechiae noted. Psych: Normal affect.   LAB RESULTS:  Lab Results  Component Value Date   NA 137 12/12/2016   K 2.7 (LL) 12/12/2016   CL 99 (L) 12/12/2016   CO2 26 12/12/2016   GLUCOSE 119 (H) 12/12/2016   BUN 16 12/12/2016   CREATININE 1.13 (H) 12/12/2016   CALCIUM 8.5 (L) 12/12/2016   PROT 6.8 12/12/2016     ALBUMIN 3.0 (L) 12/12/2016   AST 26 12/12/2016   ALT 19 12/12/2016   ALKPHOS 73 12/12/2016   BILITOT 0.6 12/12/2016   GFRNONAA 46 (L) 12/12/2016   GFRAA 54 (L) 12/12/2016    Lab Results  Component Value Date   WBC 7.2 12/12/2016   NEUTROABS 3.9 12/12/2016   HGB 11.4 (L) 12/12/2016   HCT 33.9 (L) 12/12/2016   MCV 88.7 12/12/2016   PLT 216 12/12/2016     STUDIES: No results found.  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  PLAN:    1. 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 Patient will require Neulasta support. Pretreatment MUGA scan revealed an EF of 68%. Repeat in October 2018. Proceed with cycle 3 of 6 of Taxotere, carboplatinum, Herceptin, and Perjeta today with a 10% dose reduction of Taxotere. Patient will also require a total of one year of Herceptin. At the conclusion of cycle 6 of chemotherapy, she will be referred to surgery for lumpectomy. Patient will require adjuvant XRT if she has a lumpectomy. Finally, given the PR positivity of her tumor she will benefit from an aromatase inhibitor for 5 years. Return to clinic in 3 weeks for consideration of cycle 2. 2. Nausea: Patient will return to clinic Friday and then in 1 week to receive IV fluids and IV Zofran and Decadron today. Continue Phenergan as prescribed. 3. Diarrhea: Continue Lomotil as needed.  4. Weakness and fatigue: Dose reduction of Taxotere and `IV fluids as above.   Patient expressed understanding and was in agreement with this plan. She also understands that She can call clinic at any time with any questions, concerns, or complaints.   Cancer Staging Malignant neoplasm of upper outer  quadrant of female breast [facility]) Staging form: Breast, AJCC 8th Edition - Clinical stage from 10/16/2016: Stage IIB (cT2, cN1, cM0, G3, ER: Negative, PR: Positive, HER2: Positive) - Signed by Lloyd Huger, MD on 10/16/2016   Lloyd Huger, MD   12/13/2016 9:04 AM

## 2016-12-12 ENCOUNTER — Inpatient Hospital Stay (HOSPITAL_BASED_OUTPATIENT_CLINIC_OR_DEPARTMENT_OTHER): Payer: Medicare HMO | Admitting: Oncology

## 2016-12-12 ENCOUNTER — Other Ambulatory Visit: Payer: Self-pay | Admitting: Oncology

## 2016-12-12 ENCOUNTER — Inpatient Hospital Stay: Payer: Medicare HMO

## 2016-12-12 ENCOUNTER — Encounter: Payer: Self-pay | Admitting: Oncology

## 2016-12-12 VITALS — BP 108/73 | HR 85 | Temp 98.3°F | Resp 18 | Ht 62.0 in | Wt 172.1 lb

## 2016-12-12 DIAGNOSIS — C50411 Malignant neoplasm of upper-outer quadrant of right female breast: Secondary | ICD-10-CM | POA: Diagnosis not present

## 2016-12-12 DIAGNOSIS — R110 Nausea: Secondary | ICD-10-CM | POA: Diagnosis not present

## 2016-12-12 DIAGNOSIS — R197 Diarrhea, unspecified: Secondary | ICD-10-CM | POA: Diagnosis not present

## 2016-12-12 DIAGNOSIS — Z171 Estrogen receptor negative status [ER-]: Secondary | ICD-10-CM

## 2016-12-12 DIAGNOSIS — C50919 Malignant neoplasm of unspecified site of unspecified female breast: Secondary | ICD-10-CM

## 2016-12-12 DIAGNOSIS — R531 Weakness: Secondary | ICD-10-CM

## 2016-12-12 DIAGNOSIS — E876 Hypokalemia: Secondary | ICD-10-CM

## 2016-12-12 DIAGNOSIS — Z79899 Other long term (current) drug therapy: Secondary | ICD-10-CM | POA: Diagnosis not present

## 2016-12-12 DIAGNOSIS — Z5111 Encounter for antineoplastic chemotherapy: Secondary | ICD-10-CM | POA: Diagnosis not present

## 2016-12-12 LAB — COMPREHENSIVE METABOLIC PANEL WITH GFR
ALT: 19 U/L (ref 14–54)
AST: 26 U/L (ref 15–41)
Albumin: 3 g/dL — ABNORMAL LOW (ref 3.5–5.0)
Alkaline Phosphatase: 73 U/L (ref 38–126)
Anion gap: 12 (ref 5–15)
BUN: 16 mg/dL (ref 6–20)
CO2: 26 mmol/L (ref 22–32)
Calcium: 8.5 mg/dL — ABNORMAL LOW (ref 8.9–10.3)
Chloride: 99 mmol/L — ABNORMAL LOW (ref 101–111)
Creatinine, Ser: 1.13 mg/dL — ABNORMAL HIGH (ref 0.44–1.00)
GFR calc Af Amer: 54 mL/min — ABNORMAL LOW (ref >60)
GFR calc non Af Amer: 46 mL/min — ABNORMAL LOW (ref >60)
Glucose, Bld: 119 mg/dL — ABNORMAL HIGH (ref 65–99)
Potassium: 2.7 mmol/L — CL (ref 3.5–5.1)
Sodium: 137 mmol/L (ref 135–145)
Total Bilirubin: 0.6 mg/dL (ref 0.3–1.2)
Total Protein: 6.8 g/dL (ref 6.5–8.1)

## 2016-12-12 LAB — CBC WITH DIFFERENTIAL/PLATELET
Basophils Absolute: 0 K/uL (ref 0–0.1)
Basophils Relative: 0 %
Eosinophils Absolute: 0.1 K/uL (ref 0–0.7)
Eosinophils Relative: 1 %
HCT: 33.9 % — ABNORMAL LOW (ref 35.0–47.0)
Hemoglobin: 11.4 g/dL — ABNORMAL LOW (ref 12.0–16.0)
Lymphocytes Relative: 33 %
Lymphs Abs: 2.4 K/uL (ref 1.0–3.6)
MCH: 29.9 pg (ref 26.0–34.0)
MCHC: 33.7 g/dL (ref 32.0–36.0)
MCV: 88.7 fL (ref 80.0–100.0)
Monocytes Absolute: 0.9 K/uL (ref 0.2–0.9)
Monocytes Relative: 12 %
Neutro Abs: 3.9 K/uL (ref 1.4–6.5)
Neutrophils Relative %: 54 %
Platelets: 216 K/uL (ref 150–440)
RBC: 3.82 MIL/uL (ref 3.80–5.20)
RDW: 17.6 % — ABNORMAL HIGH (ref 11.5–14.5)
WBC: 7.2 K/uL (ref 3.6–11.0)

## 2016-12-12 MED ORDER — SODIUM CHLORIDE 0.9 % IV SOLN
Freq: Once | INTRAVENOUS | Status: AC
  Administered 2016-12-12: 10:00:00 via INTRAVENOUS
  Filled 2016-12-12: qty 1000

## 2016-12-12 MED ORDER — SODIUM CHLORIDE 0.9 % IV SOLN
420.0000 mg | Freq: Once | INTRAVENOUS | Status: AC
  Administered 2016-12-12: 420 mg via INTRAVENOUS
  Filled 2016-12-12: qty 14

## 2016-12-12 MED ORDER — SODIUM CHLORIDE 0.9% FLUSH
10.0000 mL | Freq: Once | INTRAVENOUS | Status: AC
  Administered 2016-12-12: 10 mL via INTRAVENOUS
  Filled 2016-12-12: qty 10

## 2016-12-12 MED ORDER — SODIUM CHLORIDE 0.9 % IV SOLN
40.0000 meq | Freq: Once | INTRAVENOUS | Status: AC
  Administered 2016-12-12: 40 meq via INTRAVENOUS
  Filled 2016-12-12: qty 20

## 2016-12-12 MED ORDER — SODIUM CHLORIDE 0.9 % IV SOLN
Freq: Once | INTRAVENOUS | Status: DC
  Filled 2016-12-12: qty 1000

## 2016-12-12 MED ORDER — PEGFILGRASTIM 6 MG/0.6ML ~~LOC~~ PSKT
6.0000 mg | PREFILLED_SYRINGE | Freq: Once | SUBCUTANEOUS | Status: AC
  Administered 2016-12-12: 6 mg via SUBCUTANEOUS
  Filled 2016-12-12: qty 0.6

## 2016-12-12 MED ORDER — SODIUM CHLORIDE 0.9 % IV SOLN: Freq: Once | INTRAVENOUS | Status: DC

## 2016-12-12 MED ORDER — TRASTUZUMAB CHEMO 150 MG IV SOLR
450.0000 mg | Freq: Once | INTRAVENOUS | Status: AC
  Administered 2016-12-12: 450 mg via INTRAVENOUS
  Filled 2016-12-12: qty 21.43

## 2016-12-12 MED ORDER — DEXAMETHASONE SODIUM PHOSPHATE 10 MG/ML IJ SOLN
10.0000 mg | Freq: Once | INTRAMUSCULAR | Status: AC
  Administered 2016-12-12: 10 mg via INTRAVENOUS
  Filled 2016-12-12: qty 1

## 2016-12-12 MED ORDER — ACETAMINOPHEN 325 MG PO TABS
650.0000 mg | ORAL_TABLET | Freq: Once | ORAL | Status: AC
  Administered 2016-12-12: 650 mg via ORAL
  Filled 2016-12-12: qty 2

## 2016-12-12 MED ORDER — DOCETAXEL CHEMO INJECTION 160 MG/16ML
67.0000 mg/m2 | Freq: Once | INTRAVENOUS | Status: AC
  Administered 2016-12-12: 120 mg via INTRAVENOUS
  Filled 2016-12-12: qty 12

## 2016-12-12 MED ORDER — SODIUM CHLORIDE 0.9 % IV SOLN
440.0000 mg | Freq: Once | INTRAVENOUS | Status: AC
  Administered 2016-12-12: 440 mg via INTRAVENOUS
  Filled 2016-12-12: qty 44

## 2016-12-12 MED ORDER — HEPARIN SOD (PORK) LOCK FLUSH 100 UNIT/ML IV SOLN
500.0000 [IU] | Freq: Once | INTRAVENOUS | Status: AC
  Administered 2016-12-12: 500 [IU] via INTRAVENOUS
  Filled 2016-12-12: qty 5

## 2016-12-12 MED ORDER — DIPHENHYDRAMINE HCL 25 MG PO CAPS
25.0000 mg | ORAL_CAPSULE | Freq: Once | ORAL | Status: AC
  Administered 2016-12-12: 25 mg via ORAL
  Filled 2016-12-12: qty 1

## 2016-12-12 MED ORDER — PALONOSETRON HCL INJECTION 0.25 MG/5ML
0.2500 mg | Freq: Once | INTRAVENOUS | Status: AC
  Administered 2016-12-12: 0.25 mg via INTRAVENOUS
  Filled 2016-12-12: qty 5

## 2016-12-12 NOTE — Progress Notes (Signed)
Pt tired but ok, sob on exertion. Eating ok and some days better and some days. She does better with fluids on Friday.

## 2016-12-15 ENCOUNTER — Inpatient Hospital Stay: Payer: Medicare HMO

## 2016-12-15 ENCOUNTER — Telehealth: Payer: Self-pay | Admitting: *Deleted

## 2016-12-15 DIAGNOSIS — C50411 Malignant neoplasm of upper-outer quadrant of right female breast: Secondary | ICD-10-CM

## 2016-12-15 DIAGNOSIS — E876 Hypokalemia: Secondary | ICD-10-CM

## 2016-12-15 DIAGNOSIS — R110 Nausea: Secondary | ICD-10-CM

## 2016-12-15 DIAGNOSIS — Z5111 Encounter for antineoplastic chemotherapy: Secondary | ICD-10-CM | POA: Diagnosis not present

## 2016-12-15 LAB — COMPREHENSIVE METABOLIC PANEL
ALK PHOS: 80 U/L (ref 38–126)
ALT: 19 U/L (ref 14–54)
ANION GAP: 9 (ref 5–15)
AST: 32 U/L (ref 15–41)
Albumin: 2.9 g/dL — ABNORMAL LOW (ref 3.5–5.0)
BUN: 17 mg/dL (ref 6–20)
CALCIUM: 7.7 mg/dL — AB (ref 8.9–10.3)
CO2: 28 mmol/L (ref 22–32)
CREATININE: 1.02 mg/dL — AB (ref 0.44–1.00)
Chloride: 98 mmol/L — ABNORMAL LOW (ref 101–111)
GFR calc Af Amer: >60 mL/min (ref >60)
GFR calc non Af Amer: 52 mL/min — ABNORMAL LOW (ref >60)
GLUCOSE: 149 mg/dL — AB (ref 65–99)
Potassium: 2.8 mmol/L — ABNORMAL LOW (ref 3.5–5.1)
SODIUM: 135 mmol/L (ref 135–145)
Total Bilirubin: 0.7 mg/dL (ref 0.3–1.2)
Total Protein: 6.1 g/dL — ABNORMAL LOW (ref 6.5–8.1)

## 2016-12-15 LAB — CBC WITH DIFFERENTIAL/PLATELET
BASOS ABS: 0.1 10*3/uL (ref 0–0.1)
BASOS PCT: 1 %
EOS ABS: 0.2 10*3/uL (ref 0–0.7)
Eosinophils Relative: 2 %
HCT: 32.2 % — ABNORMAL LOW (ref 35.0–47.0)
HEMOGLOBIN: 10.7 g/dL — AB (ref 12.0–16.0)
Lymphocytes Relative: 11 %
Lymphs Abs: 1.4 10*3/uL (ref 1.0–3.6)
MCH: 29.8 pg (ref 26.0–34.0)
MCHC: 33.4 g/dL (ref 32.0–36.0)
MCV: 89.1 fL (ref 80.0–100.0)
Monocytes Absolute: 0.1 10*3/uL — ABNORMAL LOW (ref 0.2–0.9)
Monocytes Relative: 1 %
NEUTROS PCT: 85 %
Neutro Abs: 10.4 10*3/uL — ABNORMAL HIGH (ref 1.4–6.5)
Platelets: 165 10*3/uL (ref 150–440)
RBC: 3.61 MIL/uL — AB (ref 3.80–5.20)
RDW: 18.5 % — ABNORMAL HIGH (ref 11.5–14.5)
WBC: 12.2 10*3/uL — AB (ref 3.6–11.0)

## 2016-12-15 MED ORDER — ONDANSETRON 8 MG PO TBDP
8.0000 mg | ORAL_TABLET | Freq: Once | ORAL | Status: AC
Start: 2016-12-15 — End: 2016-12-15
  Administered 2016-12-15: 8 mg via ORAL
  Filled 2016-12-15: qty 1

## 2016-12-15 MED ORDER — SODIUM CHLORIDE 0.9 % IV SOLN: Freq: Once | INTRAVENOUS | Status: DC

## 2016-12-15 MED ORDER — SODIUM CHLORIDE 0.9 % IV SOLN
INTRAVENOUS | Status: DC
  Administered 2016-12-15: 14:00:00 via INTRAVENOUS
  Filled 2016-12-15: qty 1000

## 2016-12-15 MED ORDER — HEPARIN SOD (PORK) LOCK FLUSH 100 UNIT/ML IV SOLN
500.0000 [IU] | Freq: Once | INTRAVENOUS | Status: AC
  Administered 2016-12-15: 500 [IU] via INTRAVENOUS
  Filled 2016-12-15: qty 5

## 2016-12-15 MED ORDER — SODIUM CHLORIDE 0.9 % IV SOLN
40.0000 meq | Freq: Once | INTRAVENOUS | Status: AC
  Administered 2016-12-15: 40 meq via INTRAVENOUS
  Filled 2016-12-15: qty 20

## 2016-12-15 NOTE — Telephone Encounter (Signed)
Kim called with potassium 2.8, Dr. Grayland Ormond said to give 40 meq IV today and then tell her to cont. Her potassium at home.  Kim asked pt and she states she had rx from a while ago and she got it filed today and will start back taking it.  The pt also asked for iv nausea med since she is nauseated. Dr. Grayland Ormond gave me verbal orders for iv zofran and both orders entered. Kim aware.

## 2016-12-18 ENCOUNTER — Other Ambulatory Visit: Payer: Self-pay | Admitting: *Deleted

## 2016-12-18 MED ORDER — POTASSIUM CHLORIDE ER 20 MEQ PO TBCR: 1.0000 | EXTENDED_RELEASE_TABLET | Freq: Every day | ORAL | 2 refills | Status: DC

## 2016-12-18 MED ORDER — DIPHENOXYLATE-ATROPINE 2.5-0.025 MG PO TABS: 1.0000 | ORAL_TABLET | Freq: Four times a day (QID) | ORAL | 1 refills | Status: DC | PRN

## 2016-12-18 NOTE — Telephone Encounter (Signed)
Requesting refill on Lomotil and if you will fill Pottassium since her PCP is out on Maternity leave and no one is responding to refill request.

## 2016-12-20 ENCOUNTER — Inpatient Hospital Stay: Payer: Medicare HMO

## 2016-12-20 DIAGNOSIS — C50411 Malignant neoplasm of upper-outer quadrant of right female breast: Secondary | ICD-10-CM

## 2016-12-20 DIAGNOSIS — Z5111 Encounter for antineoplastic chemotherapy: Secondary | ICD-10-CM | POA: Diagnosis not present

## 2016-12-20 MED ORDER — HEPARIN SOD (PORK) LOCK FLUSH 100 UNIT/ML IV SOLN
500.0000 [IU] | Freq: Once | INTRAVENOUS | Status: AC
  Administered 2016-12-20: 500 [IU] via INTRAVENOUS
  Filled 2016-12-20: qty 5

## 2016-12-20 MED ORDER — SODIUM CHLORIDE 0.9 % IV SOLN
Freq: Once | INTRAVENOUS | Status: AC
  Administered 2016-12-20: 14:00:00 via INTRAVENOUS
  Filled 2016-12-20: qty 1000

## 2016-12-29 NOTE — Progress Notes (Signed)
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  INTERVAL HISTORY: Patient returns to clinic today for further evaluation and consideration of cycle 4 of 6 of Taxotere, carboplatinum, Herceptin, and Perjeta. She tolerated cycle 3 significantly better after dose reduction of Taxotere. She continues to have weakness and fatigue. She has a fair appetite. She has no neurologic complaints. She denies any recent fevers or illnesses. She has no chest pain or shortness of breath.  She denies any nausea, vomiting, constipation, or diarrhea. She has no urinary complaints. Patient offers no further specific complaints today.  REVIEW OF SYSTEMS:   Review of Systems  Constitutional: Positive for malaise/fatigue. Negative for fever and weight loss.  Respiratory: Negative.  Negative for cough and shortness of breath.   Cardiovascular: Negative.  Negative for chest pain and leg swelling.  Gastrointestinal: Negative for abdominal pain, diarrhea and nausea.  Genitourinary: Negative.   Musculoskeletal: Negative.   Skin: Negative.  Negative for rash.  Neurological: Positive for weakness.  Psychiatric/Behavioral: The patient is nervous/anxious.     As per HPI. Otherwise, a complete review of systems is negative.  PAST MEDICAL HISTORY: Past Medical History:  Diagnosis Date  . Anxiety   . Arthritis   . Cancer [facility])    Right Breast Cancer  . COPD (chronic obstructive pulmonary disease) ([place])   . Dyspnea    with exertion  . GERD (gastroesophageal reflux disease)   . Hypertension     PAST SURGICAL HISTORY: Past Surgical History:  Procedure Laterality Date  . ABDOMINAL HYSTERECTOMY  1990   Partial  . BREAST BIOPSY Right 10/04/2016   axilla lymph node and axillay tail mass biopsy. Path pending  . DILATION AND CURETTAGE OF UTERUS    . PORTACATH PLACEMENT Left 10/24/2016   Procedure: INSERTION PORT-A-CATH;  Surgeon: [name], MD;  Location: [facility] ORS;  Service: General;  Laterality: Left;    FAMILY HISTORY: Family History  Problem Relation Age of Onset  . Colon cancer Mother   . Breast cancer Neg Hx     ADVANCED DIRECTIVES (Y/N):  N  HEALTH MAINTENANCE: Social History  Substance Use Topics  . Smoking status: Former Smoker    Packs/day: 0.50    Types: Cigarettes    Quit date: 07/23/1988  . Smokeless tobacco: Never Used  . Alcohol use No     Colonoscopy:  PAP:  Bone density:  Lipid panel:  Allergies  Allergen Reactions  . Atorvastatin Other (See Comments)    Muscle Pain  . Gabapentin Swelling  . Penicillins Swelling    Current Outpatient Prescriptions  Medication Sig Dispense Refill  . ADVAIR DISKUS 500-50 MCG/DOSE AEPB Inhale 1 puff into the lungs 2 (two) times daily.     Marland Kitchen ALPRAZolam (XANAX) 0.25 MG tablet Take 1 tablet (0.25 mg total) by mouth at bedtime as needed for anxiety. 30 tablet 0  . baclofen (LIORESAL) 10 MG tablet Take 10 mg by mouth at bedtime.     Marland Kitchen co-enzyme Q-10 30 MG capsule Take 30 mg by mouth daily.    . diphenoxylate-atropine (LOMOTIL) 2.5-0.025 MG tablet Take 1 tablet by mouth 4 (four) times daily as needed for diarrhea or loose stools. 30 tablet 1  . DULoxetine (CYMBALTA) 60  MG capsule Take 1 capsule (60 mg total) by mouth daily. 30 capsule 0  . lidocaine-prilocaine (EMLA) cream Apply to affected area once 30 g 3  . losartan-hydrochlorothiazide (HYZAAR) 100-25 MG tablet Take 1 tablet by mouth daily.     . montelukast (SINGULAIR) 10 MG tablet Take 10 mg by mouth daily.     . Omega-3 Fatty Acids (FISH OIL PO) Take 1 capsule by mouth daily.     Marland Kitchen omeprazole (PRILOSEC) 20 MG capsule Take 20 mg by mouth daily.    . ondansetron (ZOFRAN)  8 MG tablet Take 1 tablet (8 mg total) by mouth 2 (two) times daily as needed for refractory nausea / vomiting. 60 tablet 2  . Potassium Chloride ER 20 MEQ TBCR Take 1 tablet by mouth daily. 60 tablet 2  . prochlorperazine (COMPAZINE) 10 MG tablet Take 1 tablet (10 mg total) by mouth every 6 (six) hours as needed (Nausea or vomiting). 60 tablet 2  . promethazine (PHENERGAN) 25 MG tablet TAKE 1 TABLET BY MOUTH EVERY 6-8 HOURS AS NEEDED FOR NAUSEA  2  . triamcinolone ointment (KENALOG) 0.5 % Apply 1 application topically 2 (two) times daily. Rash on bilateral upper extremities 30 g 0   No current facility-administered medications for this visit.    Facility-Administered Medications Ordered in Other Visits  Medication Dose Route Frequency Provider Last Rate Last Dose  . 0.9 %  sodium chloride infusion   Intravenous Once Lloyd Huger, MD      . 0.9 %  sodium chloride infusion   Intravenous Once Lloyd Huger, MD      . heparin lock flush 100 unit/mL  500 Units Intravenous Once Lloyd Huger, MD        OBJECTIVE: Vitals:   01/02/17 0912  BP: 124/80  Pulse: 96  Resp: 18  Temp: 97.7 F (36.5 C)     Body mass index is 30.86 kg/m.    ECOG FS:0 - Asymptomatic  General: Well-developed, well-nourished, no acute distress. Eyes: Pink conjunctiva, anicteric sclera. Breasts: Easily palpable right breast mass. Lungs: Clear to auscultation bilaterally. Heart: Regular rate and rhythm. No rubs, murmurs, or gallops. Abdomen: Soft, nontender, nondistended. No organomegaly noted, normoactive bowel sounds. Musculoskeletal: No edema, cyanosis, or clubbing. Neuro: Alert, answering all questions appropriately. Cranial nerves grossly intact. Skin: No rashes or petechiae noted. Psych: Normal affect.   LAB RESULTS:  Lab Results  Component Value Date   NA 140 01/02/2017   K 3.0 (L) 01/02/2017   CL 106 01/02/2017   CO2 24 01/02/2017   GLUCOSE 116 (H) 01/02/2017   BUN 16 01/02/2017     CREATININE 1.38 (H) 01/02/2017   CALCIUM 7.7 (L) 01/02/2017   PROT 6.6 01/02/2017   ALBUMIN 3.0 (L) 01/02/2017   AST 22 01/02/2017   ALT 9 (L) 01/02/2017   ALKPHOS 70 01/02/2017   BILITOT 0.4 01/02/2017   GFRNONAA 36 (L) 01/02/2017   GFRAA 42 (L) 01/02/2017    Lab Results  Component Value Date   WBC 8.7 01/02/2017   NEUTROABS 4.2 01/02/2017   HGB 10.5 (L) 01/02/2017   HCT 30.8 (L) 01/02/2017   MCV 91.8 01/02/2017   PLT 536 (H) 01/02/2017     STUDIES: No results found.  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  PLAN:    1. Clinical stage IIB ER negative, PR and HER-2 positive invasive carcinoma of the right upper outer quadrant  breast: Given the size and HER-2/neu positive nature of the patient's tumor, have recommended neoadjuvant chemotherapy using Taxotere, carboplatinum, Herceptin, and Perjeta. Patient will require Neulasta support. Pretreatment MUGA scan revealed an EF of 68%. Repeat in October 2018. Proceed with cycle 4 of 6 of Taxotere, carboplatinum, Herceptin, and Perjeta today with a 10% dose reduction of Taxotere. Patient will also require a total of one year of Herceptin. At the conclusion of cycle 6 of chemotherapy, she will be referred to surgery for lumpectomy. Patient will require adjuvant XRT if she has a lumpectomy. Finally, given the PR positivity of her tumor she will benefit from an aromatase inhibitor for 5 years. Return to clinic in 3 weeks for consideration of cycle 5. 2. Nausea: Patient will return to clinic Friday and then in 1 week to receive IV fluids and IV Zofran and Decadron. Continue Phenergan as prescribed. 3. Diarrhea: Continue Lomotil as needed.  4. Weakness and fatigue: Dose reduction of Taxotere and `IV fluids as above. 5. Hypokalemia: Continue oral potassium supplementation as prescribed.   Patient expressed understanding and was in agreement with this  plan. She also understands that She can call clinic at any time with any questions, concerns, or complaints.   Cancer Staging Malignant neoplasm of upper outer quadrant of female breast [facility]) Staging form: Breast, AJCC 8th Edition - Clinical stage from 10/16/2016: Stage IIB (cT2, cN1, cM0, G3, ER: Negative, PR: Positive, HER2: Positive) - Signed by Lloyd Huger, MD on 10/16/2016   Lloyd Huger, MD   01/02/2017 9:56 AM

## 2017-01-02 ENCOUNTER — Inpatient Hospital Stay: Payer: Medicare HMO

## 2017-01-02 ENCOUNTER — Inpatient Hospital Stay: Payer: Medicare HMO | Attending: Oncology | Admitting: Oncology

## 2017-01-02 ENCOUNTER — Other Ambulatory Visit: Payer: Self-pay | Admitting: Oncology

## 2017-01-02 VITALS — BP 124/80 | HR 96 | Temp 97.7°F | Resp 18 | Wt 168.7 lb

## 2017-01-02 DIAGNOSIS — M129 Arthropathy, unspecified: Secondary | ICD-10-CM | POA: Diagnosis not present

## 2017-01-02 DIAGNOSIS — R5383 Other fatigue: Secondary | ICD-10-CM | POA: Insufficient documentation

## 2017-01-02 DIAGNOSIS — Z800 Family history of malignant neoplasm of digestive organs: Secondary | ICD-10-CM | POA: Insufficient documentation

## 2017-01-02 DIAGNOSIS — Z5111 Encounter for antineoplastic chemotherapy: Secondary | ICD-10-CM | POA: Insufficient documentation

## 2017-01-02 DIAGNOSIS — Z7689 Persons encountering health services in other specified circumstances: Secondary | ICD-10-CM | POA: Insufficient documentation

## 2017-01-02 DIAGNOSIS — Z87891 Personal history of nicotine dependence: Secondary | ICD-10-CM | POA: Insufficient documentation

## 2017-01-02 DIAGNOSIS — E876 Hypokalemia: Secondary | ICD-10-CM

## 2017-01-02 DIAGNOSIS — C50411 Malignant neoplasm of upper-outer quadrant of right female breast: Secondary | ICD-10-CM | POA: Diagnosis not present

## 2017-01-02 DIAGNOSIS — I10 Essential (primary) hypertension: Secondary | ICD-10-CM

## 2017-01-02 DIAGNOSIS — K219 Gastro-esophageal reflux disease without esophagitis: Secondary | ICD-10-CM | POA: Diagnosis not present

## 2017-01-02 DIAGNOSIS — Z5112 Encounter for antineoplastic immunotherapy: Secondary | ICD-10-CM | POA: Insufficient documentation

## 2017-01-02 DIAGNOSIS — J449 Chronic obstructive pulmonary disease, unspecified: Secondary | ICD-10-CM | POA: Insufficient documentation

## 2017-01-02 DIAGNOSIS — R197 Diarrhea, unspecified: Secondary | ICD-10-CM | POA: Diagnosis not present

## 2017-01-02 DIAGNOSIS — C50919 Malignant neoplasm of unspecified site of unspecified female breast: Secondary | ICD-10-CM

## 2017-01-02 DIAGNOSIS — Z171 Estrogen receptor negative status [ER-]: Secondary | ICD-10-CM | POA: Diagnosis not present

## 2017-01-02 DIAGNOSIS — Z79899 Other long term (current) drug therapy: Secondary | ICD-10-CM | POA: Diagnosis not present

## 2017-01-02 DIAGNOSIS — R531 Weakness: Secondary | ICD-10-CM | POA: Insufficient documentation

## 2017-01-02 DIAGNOSIS — F419 Anxiety disorder, unspecified: Secondary | ICD-10-CM | POA: Diagnosis not present

## 2017-01-02 LAB — CBC WITH DIFFERENTIAL/PLATELET
Basophils Absolute: 0.1 10*3/uL (ref 0–0.1)
Basophils Relative: 1 %
EOS PCT: 1 %
Eosinophils Absolute: 0.1 10*3/uL (ref 0–0.7)
HEMATOCRIT: 30.8 % — AB (ref 35.0–47.0)
Hemoglobin: 10.5 g/dL — ABNORMAL LOW (ref 12.0–16.0)
LYMPHS PCT: 37 %
Lymphs Abs: 3.2 10*3/uL (ref 1.0–3.6)
MCH: 31.2 pg (ref 26.0–34.0)
MCHC: 34 g/dL (ref 32.0–36.0)
MCV: 91.8 fL (ref 80.0–100.0)
MONO ABS: 1.1 10*3/uL — AB (ref 0.2–0.9)
MONOS PCT: 13 %
NEUTROS ABS: 4.2 10*3/uL (ref 1.4–6.5)
Neutrophils Relative %: 48 %
Platelets: 536 10*3/uL — ABNORMAL HIGH (ref 150–440)
RBC: 3.36 MIL/uL — ABNORMAL LOW (ref 3.80–5.20)
RDW: 21.6 % — AB (ref 11.5–14.5)
WBC: 8.7 10*3/uL (ref 3.6–11.0)

## 2017-01-02 LAB — COMPREHENSIVE METABOLIC PANEL
ALT: 9 U/L — ABNORMAL LOW (ref 14–54)
ANION GAP: 10 (ref 5–15)
AST: 22 U/L (ref 15–41)
Albumin: 3 g/dL — ABNORMAL LOW (ref 3.5–5.0)
Alkaline Phosphatase: 70 U/L (ref 38–126)
BILIRUBIN TOTAL: 0.4 mg/dL (ref 0.3–1.2)
BUN: 16 mg/dL (ref 6–20)
CO2: 24 mmol/L (ref 22–32)
Calcium: 7.7 mg/dL — ABNORMAL LOW (ref 8.9–10.3)
Chloride: 106 mmol/L (ref 101–111)
Creatinine, Ser: 1.38 mg/dL — ABNORMAL HIGH (ref 0.44–1.00)
GFR, EST AFRICAN AMERICAN: 42 mL/min — AB (ref >60)
GFR, EST NON AFRICAN AMERICAN: 36 mL/min — AB (ref >60)
Glucose, Bld: 116 mg/dL — ABNORMAL HIGH (ref 65–99)
POTASSIUM: 3 mmol/L — AB (ref 3.5–5.1)
Sodium: 140 mmol/L (ref 135–145)
TOTAL PROTEIN: 6.6 g/dL (ref 6.5–8.1)

## 2017-01-02 MED ORDER — SODIUM CHLORIDE 0.9 % IV SOLN
Freq: Once | INTRAVENOUS | Status: AC
  Administered 2017-01-02: 10:00:00 via INTRAVENOUS
  Filled 2017-01-02: qty 1000

## 2017-01-02 MED ORDER — SODIUM CHLORIDE 0.9 % IV SOLN
390.0000 mg | Freq: Once | INTRAVENOUS | Status: AC
  Administered 2017-01-02: 390 mg via INTRAVENOUS
  Filled 2017-01-02: qty 39

## 2017-01-02 MED ORDER — HEPARIN SOD (PORK) LOCK FLUSH 100 UNIT/ML IV SOLN
500.0000 [IU] | Freq: Once | INTRAVENOUS | Status: AC
  Administered 2017-01-02: 500 [IU] via INTRAVENOUS
  Filled 2017-01-02: qty 5

## 2017-01-02 MED ORDER — SODIUM CHLORIDE 0.9 % IV SOLN
420.0000 mg | Freq: Once | INTRAVENOUS | Status: AC
  Administered 2017-01-02: 420 mg via INTRAVENOUS
  Filled 2017-01-02: qty 14

## 2017-01-02 MED ORDER — SODIUM CHLORIDE 0.9 % IV SOLN: Freq: Once | INTRAVENOUS | Status: DC

## 2017-01-02 MED ORDER — SODIUM CHLORIDE 0.9 % IV SOLN
Freq: Once | INTRAVENOUS | Status: DC
  Filled 2017-01-02: qty 1000

## 2017-01-02 MED ORDER — ACETAMINOPHEN 325 MG PO TABS
650.0000 mg | ORAL_TABLET | Freq: Once | ORAL | Status: AC
  Administered 2017-01-02: 650 mg via ORAL
  Filled 2017-01-02: qty 2

## 2017-01-02 MED ORDER — SODIUM CHLORIDE 0.9% FLUSH
10.0000 mL | Freq: Once | INTRAVENOUS | Status: AC
  Administered 2017-01-02: 10 mL via INTRAVENOUS
  Filled 2017-01-02: qty 10

## 2017-01-02 MED ORDER — SODIUM CHLORIDE 0.9 % IV SOLN
Freq: Once | INTRAVENOUS | Status: AC
  Filled 2017-01-02: qty 4

## 2017-01-02 MED ORDER — DIPHENHYDRAMINE HCL 25 MG PO CAPS
25.0000 mg | ORAL_CAPSULE | Freq: Once | ORAL | Status: AC
  Administered 2017-01-02: 25 mg via ORAL
  Filled 2017-01-02: qty 1

## 2017-01-02 MED ORDER — TRASTUZUMAB CHEMO 150 MG IV SOLR
450.0000 mg | Freq: Once | INTRAVENOUS | Status: AC
  Administered 2017-01-02: 450 mg via INTRAVENOUS
  Filled 2017-01-02: qty 21.43

## 2017-01-02 MED ORDER — PALONOSETRON HCL INJECTION 0.25 MG/5ML
0.2500 mg | Freq: Once | INTRAVENOUS | Status: AC
  Administered 2017-01-02: 0.25 mg via INTRAVENOUS
  Filled 2017-01-02: qty 5

## 2017-01-02 MED ORDER — PEGFILGRASTIM 6 MG/0.6ML ~~LOC~~ PSKT
6.0000 mg | PREFILLED_SYRINGE | Freq: Once | SUBCUTANEOUS | Status: AC
  Administered 2017-01-02: 6 mg via SUBCUTANEOUS
  Filled 2017-01-02: qty 0.6

## 2017-01-02 MED ORDER — DOCETAXEL CHEMO INJECTION 160 MG/16ML
67.0000 mg/m2 | Freq: Once | INTRAVENOUS | Status: AC
  Administered 2017-01-02: 120 mg via INTRAVENOUS
  Filled 2017-01-02: qty 12

## 2017-01-02 MED ORDER — DEXAMETHASONE SODIUM PHOSPHATE 10 MG/ML IJ SOLN
10.0000 mg | Freq: Once | INTRAMUSCULAR | Status: AC
  Administered 2017-01-02: 10 mg via INTRAVENOUS
  Filled 2017-01-02: qty 1

## 2017-01-02 NOTE — Progress Notes (Signed)
Proceed with treatment today per Dr. Grayland Ormond.  Patient states she is taking oral potassium. LJ

## 2017-01-02 NOTE — Progress Notes (Signed)
Cr =1.38, increased from last time patient got chemo.  Carbo dose changed (b/c >10% change in dose).

## 2017-01-05 ENCOUNTER — Inpatient Hospital Stay: Payer: Medicare HMO

## 2017-01-05 VITALS — BP 116/72 | HR 93 | Temp 96.7°F | Resp 20

## 2017-01-05 DIAGNOSIS — Z5112 Encounter for antineoplastic immunotherapy: Secondary | ICD-10-CM | POA: Diagnosis not present

## 2017-01-05 DIAGNOSIS — E860 Dehydration: Secondary | ICD-10-CM

## 2017-01-05 MED ORDER — SODIUM CHLORIDE 0.9 % IV SOLN
Freq: Once | INTRAVENOUS | Status: AC
  Administered 2017-01-05: 10:00:00 via INTRAVENOUS
  Filled 2017-01-05: qty 4

## 2017-01-05 MED ORDER — SODIUM CHLORIDE 0.9% FLUSH
10.0000 mL | Freq: Once | INTRAVENOUS | Status: AC
  Administered 2017-01-05: 10 mL via INTRAVENOUS
  Filled 2017-01-05: qty 10

## 2017-01-05 MED ORDER — HEPARIN SOD (PORK) LOCK FLUSH 100 UNIT/ML IV SOLN
500.0000 [IU] | Freq: Once | INTRAVENOUS | Status: AC
  Administered 2017-01-05: 500 [IU] via INTRAVENOUS
  Filled 2017-01-05: qty 5

## 2017-01-05 MED ORDER — SODIUM CHLORIDE 0.9 % IV SOLN
Freq: Once | INTRAVENOUS | Status: AC
  Administered 2017-01-05: 10:00:00 via INTRAVENOUS
  Filled 2017-01-05: qty 1000

## 2017-01-10 ENCOUNTER — Inpatient Hospital Stay: Payer: Medicare HMO

## 2017-01-10 VITALS — BP 118/72 | HR 92 | Temp 97.1°F | Resp 20

## 2017-01-10 DIAGNOSIS — Z5112 Encounter for antineoplastic immunotherapy: Secondary | ICD-10-CM | POA: Diagnosis not present

## 2017-01-10 DIAGNOSIS — C50411 Malignant neoplasm of upper-outer quadrant of right female breast: Secondary | ICD-10-CM

## 2017-01-10 MED ORDER — HEPARIN SOD (PORK) LOCK FLUSH 100 UNIT/ML IV SOLN
500.0000 [IU] | Freq: Once | INTRAVENOUS | Status: AC
  Administered 2017-01-10: 500 [IU] via INTRAVENOUS

## 2017-01-10 MED ORDER — SODIUM CHLORIDE 0.9 % IV SOLN
Freq: Once | INTRAVENOUS | Status: AC
  Administered 2017-01-10: 14:00:00 via INTRAVENOUS
  Filled 2017-01-10: qty 1000

## 2017-01-10 MED ORDER — SODIUM CHLORIDE 0.9% FLUSH
10.0000 mL | INTRAVENOUS | Status: DC | PRN
  Administered 2017-01-10: 10 mL via INTRAVENOUS
  Filled 2017-01-10: qty 10

## 2017-01-10 MED ORDER — SODIUM CHLORIDE 0.9 % IV SOLN
Freq: Once | INTRAVENOUS | Status: AC
  Administered 2017-01-10: 14:00:00 via INTRAVENOUS
  Filled 2017-01-10: qty 4

## 2017-01-19 ENCOUNTER — Encounter
Admission: RE | Admit: 2017-01-19 | Discharge: 2017-01-19 | Disposition: A | Discharge status: Home / Self Care | Payer: Medicare HMO | Source: Ambulatory Visit | Attending: Oncology | Admitting: Oncology

## 2017-01-19 DIAGNOSIS — C50411 Malignant neoplasm of upper-outer quadrant of right female breast: Secondary | ICD-10-CM | POA: Insufficient documentation

## 2017-01-19 IMAGING — NM NM CARDIA MUGA REST
7 series · 27 of 27 positions shown · non-contrast
Comparison: [DATE]

CLINICAL DATA: Breast cancer, undergoing chemotherapy

EXAM:
NUCLEAR MEDICINE CARDIAC BLOOD POOL IMAGING (MUGA)
TECHNIQUE: Cardiac multi-gated acquisition was performed at rest following
intravenous injection of [VW] labeled red blood cells.
RADIOPHARMACEUTICALS:  21.5 mCi [VW] MDP in-vitro labeled red
blood cells IV

[Series 1000: anterior · 3.30mm/px · 1 of 1 slices shown]
[im 1/1]
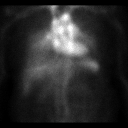

[Series 1000: 70 lao · 3.30mm/px · 1 of 1 slices shown]
[im 1/1]
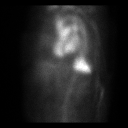

[Series 1000: 45 lao-gated (results) · 3.30mm/px · 6 of 24 frames shown]
[frame 3/24]
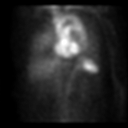
[frame 7/24]
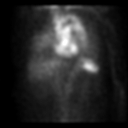
[frame 11/24]
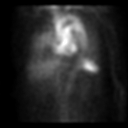
[frame 15/24]
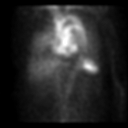
[frame 19/24]
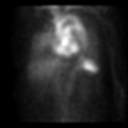
[frame 23/24]
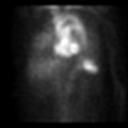

[Series 1000: 70 lao-gated · 3.30mm/px · 6 of 24 frames shown]
[frame 3/24]
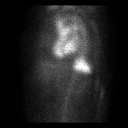
[frame 7/24]
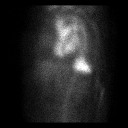
[frame 11/24]
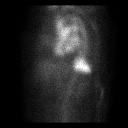
[frame 15/24]
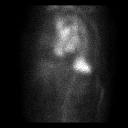
[frame 19/24]
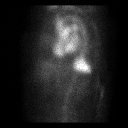
[frame 23/24]
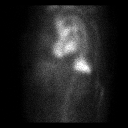

[Series 1000: anterior-gated · 3.30mm/px · 6 of 24 frames shown]
[frame 3/24]
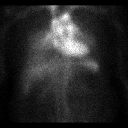
[frame 7/24]
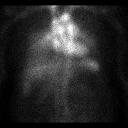
[frame 11/24]
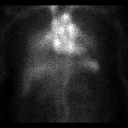
[frame 15/24]
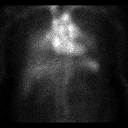
[frame 19/24]
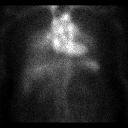
[frame 23/24]
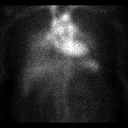

[Series 1000: 45 lao-gated · 3.30mm/px · 6 of 24 frames shown]
[frame 3/24]
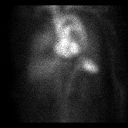
[frame 7/24]
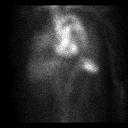
[frame 11/24]
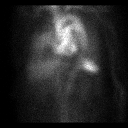
[frame 15/24]
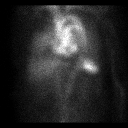
[frame 19/24]
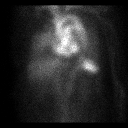
[frame 23/24]
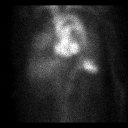

[Series 1000: 45 lao · 3.30mm/px · 1 of 1 slices shown]
[im 1/1]
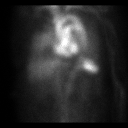

[27 of 27 positions shown; findings below may reference images not displayed]

FINDINGS: The cine images demonstrate normal left ventricular wall motion. No
akinetic or dyskinetic segments are identified.

The ejection fraction was calculated at 68%. This is stable when
compared to prior examination.
IMPRESSION: Normal left ventricular wall motion with left ventricular ejection
fraction of 68% (unchanged since prior study).

## 2017-01-19 MED ORDER — TECHNETIUM TC 99M-LABELED RED BLOOD CELLS IV KIT
21.4630 | PACK | Freq: Once | INTRAVENOUS | Status: AC | PRN
  Administered 2017-01-19: 21.463 via INTRAVENOUS

## 2017-01-21 NOTE — Progress Notes (Signed)
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  INTERVAL HISTORY: Patient returns to clinic today for further evaluation and consideration of cycle 5 of 6 of Taxotere, carboplatinum, Herceptin, and Perjeta. She was in the emergency room yesterday for pleuritic chest pain, but CT did not reveal pulmonary embolism. Today she is complaining of continued shortness of breath as well as significant left wrist pain. She continues to have weakness and fatigue. She has a fair appetite. She has no neurologic complaints. She denies any recent fevers or illnesses. She denies any nausea, vomiting, constipation, or diarrhea. She has no urinary complaints. Patient offers no further specific complaints today.  REVIEW OF SYSTEMS:   Review of Systems  Constitutional: Positive for malaise/fatigue. Negative for fever and weight loss.  Respiratory: Positive for shortness of breath. Negative for cough.   Cardiovascular: Negative.  Negative for chest pain and leg swelling.  Gastrointestinal: Negative for abdominal pain, diarrhea and nausea.  Genitourinary: Negative.   Musculoskeletal: Positive for joint pain.  Skin: Negative.  Negative for rash.  Neurological: Positive for weakness.  Psychiatric/Behavioral: Negative.  The patient is not nervous/anxious.     As per HPI. Otherwise, a complete review of systems is negative.  PAST MEDICAL HISTORY: Past Medical History:  Diagnosis Date  . Anxiety   . Arthritis   . Cancer [facility])    Right Breast Cancer  . COPD (chronic obstructive pulmonary disease) ([place])   . Dyspnea    with exertion  . GERD (gastroesophageal reflux disease)   . Hypertension     PAST  SURGICAL HISTORY: Past Surgical History:  Procedure Laterality Date  . ABDOMINAL HYSTERECTOMY  1990   Partial  . BREAST BIOPSY Right 10/04/2016   axilla lymph node and axillay tail mass biopsy. Path pending  . DILATION AND CURETTAGE OF UTERUS    . PORTACATH PLACEMENT Left 10/24/2016   Procedure: INSERTION PORT-A-CATH;  Surgeon: [name], MD;  Location: [facility] ORS;  Service: General;  Laterality: Left;    FAMILY HISTORY: Family History  Problem Relation Age of Onset  . Colon cancer Mother   . Breast cancer Neg Hx     ADVANCED DIRECTIVES (Y/N):  N  HEALTH MAINTENANCE: Social History  Substance Use Topics  . Smoking status: Former Smoker    Packs/day: 0.50    Types: Cigarettes    Quit date: 07/23/1988  . Smokeless tobacco: Never Used  . Alcohol use No     Colonoscopy:  PAP:  Bone density:  Lipid panel:  Allergies  Allergen Reactions  . Atorvastatin Other (See Comments)    Muscle Pain  . Gabapentin Swelling  . Penicillins Swelling    Current Outpatient Prescriptions  Medication Sig Dispense Refill  . ADVAIR DISKUS 500-50 MCG/DOSE AEPB Inhale 1 puff into the lungs 2 (two) times daily.     Marland Kitchen ALPRAZolam (XANAX) 0.25 MG tablet Take 1 tablet (0.25 mg total) by mouth at bedtime as needed for anxiety. 30 tablet 0  . baclofen (LIORESAL) 10 MG tablet Take 10 mg by mouth at bedtime.     . baclofen (LIORESAL) 10 MG tablet Take 10 mg by mouth at bedtime.  9  . co-enzyme Q-10 30 MG capsule Take 30 mg by mouth  daily.    . diphenoxylate-atropine (LOMOTIL) 2.5-0.025 MG tablet Take 1 tablet by mouth 4 (four) times daily as needed for diarrhea or loose stools. 30 tablet 1  . DULoxetine (CYMBALTA) 60 MG capsule Take 1 capsule (60 mg total) by mouth daily. 30 capsule 0  . lidocaine-prilocaine (EMLA) cream Apply to affected area once 30 g 3  . losartan-hydrochlorothiazide (HYZAAR) 100-25 MG tablet Take 1 tablet by mouth daily.     . montelukast (SINGULAIR) 10 MG tablet Take 10 mg by  mouth daily.     . Omega-3 Fatty Acids (FISH OIL PO) Take 1 capsule by mouth daily.     Marland Kitchen omeprazole (PRILOSEC) 20 MG capsule Take 20 mg by mouth daily.    . ondansetron (ZOFRAN) 8 MG tablet Take 1 tablet (8 mg total) by mouth 2 (two) times daily as needed for refractory nausea / vomiting. 60 tablet 2  . oxyCODONE-acetaminophen (ROXICET) 5-325 MG tablet Take 1 tablet by mouth every 6 (six) hours as needed. 20 tablet 0  . Potassium Chloride ER 20 MEQ TBCR Take 1 tablet by mouth daily. 60 tablet 2  . prochlorperazine (COMPAZINE) 10 MG tablet Take 1 tablet (10 mg total) by mouth every 6 (six) hours as needed (Nausea or vomiting). 60 tablet 2  . promethazine (PHENERGAN) 25 MG tablet TAKE 1 TABLET BY MOUTH EVERY 6-8 HOURS AS NEEDED FOR NAUSEA  2  . triamcinolone ointment (KENALOG) 0.5 % Apply 1 application topically 2 (two) times daily. Rash on bilateral upper extremities 30 g 0  . predniSONE (STERAPRED UNI-PAK 21 TAB) 10 MG (21) TBPK tablet Taper as directed 21 tablet 0   No current facility-administered medications for this visit.    Facility-Administered Medications Ordered in Other Visits  Medication Dose Route Frequency Provider Last Rate Last Dose  . 0.9 %  sodium chloride infusion   Intravenous Once Lloyd Huger, MD      . 0.9 %  sodium chloride infusion   Intravenous Once Lloyd Huger, MD      . 0.9 %  sodium chloride infusion   Intravenous Once Lloyd Huger, MD      . heparin lock flush 100 unit/mL  500 Units Intravenous Once Lloyd Huger, MD      . ondansetron [facility]) 8 mg in sodium chloride 0.9 % 50 mL IVPB   Intravenous Once Lloyd Huger, MD      . ondansetron (ZOFRAN) 8 mg, dexamethasone (DECADRON) 10 mg in sodium chloride 0.9 % 50 mL IVPB   Intravenous Once Lloyd Huger, MD      . sodium chloride 0.9 % 1,000 mL with potassium chloride 40 mEq, magnesium sulfate 4 g infusion   Intravenous Once Lloyd Huger, MD        OBJECTIVE: Vitals:    01/23/17 0934  BP: 136/75  Pulse: 96  Temp: 99.9 F (37.7 C)     Body mass index is 30.27 kg/m.    ECOG FS:0 - Asymptomatic  General: Well-developed, well-nourished, no acute distress. Eyes: Pink conjunctiva, anicteric sclera. Breasts: Easily palpable right breast mass. Lungs: Clear to auscultation bilaterally. Heart: Regular rate and rhythm. No rubs, murmurs, or gallops. Abdomen: Soft, nontender, nondistended. No organomegaly noted, normoactive bowel sounds. Musculoskeletal: No edema, cyanosis, or clubbing. Neuro: Alert, answering all questions appropriately. Cranial nerves grossly intact. Skin: No rashes or petechiae noted. Psych: Normal affect.   LAB RESULTS:  Lab Results  Component Value Date   NA 131 (L) 01/23/2017  K 2.9 (L) 01/23/2017   CL 96 (L) 01/23/2017   CO2 24 01/23/2017   GLUCOSE 155 (H) 01/23/2017   BUN 18 01/23/2017   CREATININE 1.06 (H) 01/23/2017   CALCIUM 7.5 (L) 01/23/2017   PROT 7.2 01/23/2017   ALBUMIN 2.6 (L) 01/23/2017   AST 18 01/23/2017   ALT 12 (L) 01/23/2017   ALKPHOS 75 01/23/2017   BILITOT 0.6 01/23/2017   GFRNONAA 50 (L) 01/23/2017   GFRAA 58 (L) 01/23/2017    Lab Results  Component Value Date   WBC 15.9 (H) 01/23/2017   NEUTROABS 12.1 (H) 01/23/2017   HGB 9.0 (L) 01/23/2017   HCT 26.2 (L) 01/23/2017   MCV 94.5 01/23/2017   PLT 154 01/23/2017     STUDIES: Dg Chest 2 View  Result Date: 01/22/2017 CLINICAL DATA:  Chest pain and shortness of Breath EXAM: CHEST  2 VIEW COMPARISON:  10/24/2016 FINDINGS: Cardiac shadow is within normal limits. Left chest wall port is seen in satisfactory position. Emphysematous changes are noted in both lungs in the upper lobes but stable. No focal infiltrate or sizable effusion is seen. Mild degenerative changes of thoracic spine are noted. IMPRESSION: COPD without acute abnormality. The overall appearance is stable from the prior study. Electronically Signed   By: Inez Catalina M.D.   On:  01/22/2017 16:06   Ct Angio Chest Pe W And/or Wo Contrast  Result Date: 01/22/2017 CLINICAL DATA:  Right-sided breast cancer with chest pain and shortness of breath EXAM: CT ANGIOGRAPHY CHEST WITH CONTRAST TECHNIQUE: Multidetector CT imaging of the chest was performed using the standard protocol during bolus administration of intravenous contrast. Multiplanar CT image reconstructions and MIPs were obtained to evaluate the vascular anatomy. CONTRAST:  75 mL Isovue 370 intravenous COMPARISON:  Radiographs 01/22/2017, CT chest 04/08/2014, 04/09/2014 FINDINGS: Cardiovascular: Satisfactory opacification of the pulmonary arteries to the segmental level. No evidence of pulmonary embolism. Aortic atherosclerosis. No dissection is seen. Left-sided central venous catheter tip terminates in the proximal right atrium. Borderline to mild cardiomegaly. Coronary artery calcification. No pericardial effusion Mediastinum/Nodes: Midline trachea. No thyroid mass. Mild distension and debris filled esophagus. Nonspecific subcentimeter mediastinal lymph nodes. Subcentimeter right axillary lymph nodes. Moderate to large hiatal hernia. Lungs/Pleura: Moderate severe emphysema. No consolidation, pleural effusion or pneumothorax Upper Abdomen: Stable low-density lesion in the upper pole of right kidney, intermediate density values but no change in size since 2015, favoring benign process. No acute abnormality. Musculoskeletal: No definite acute or suspicious bone lesion is seen. Review of the MIP images confirms the above findings. IMPRESSION: 1. Negative for acute pulmonary embolus or aortic dissection 2. Moderate severe emphysema without consolidation or effusion 3. Moderate hiatal hernia with diffuse esophageal distension and debris 4. Stable 2.6 cm low-density lesion in the upper pole right kidney, favor benign process given lack of interval growth since 2015 Aortic Atherosclerosis (ICD10-I70.0) and Emphysema (ICD10-J43.9).  Electronically Signed   By: Donavan Foil M.D.   On: 01/22/2017 19:46   Nm Cardiac Muga Rest  Result Date: 01/22/2017 CLINICAL DATA:  Breast cancer, undergoing chemotherapy EXAM: NUCLEAR MEDICINE CARDIAC BLOOD POOL IMAGING (MUGA) TECHNIQUE: Cardiac multi-gated acquisition was performed at rest following intravenous injection of Tc-9mlabeled red blood cells. RADIOPHARMACEUTICALS:  21.5 mCi Tc-969mDP in-vitro labeled red blood cells IV COMPARISON:  10/20/2016 FINDINGS: The cine images demonstrate normal left ventricular wall motion. No akinetic or dyskinetic segments are identified. The ejection fraction was calculated at 68%. This is stable when compared to prior examination. IMPRESSION: Normal left  ventricular wall motion with left ventricular ejection fraction of 68% (unchanged since prior study). Electronically Signed   By: Marijo Sanes M.D.   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  PLAN:    1. 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 Patient will require Neulasta support. MUGA scan from January 19, 2017 revealed an EF of 68% which is unchanged from previous. Delay cycle 5 of 6 of Taxotere, carboplatinum, Herceptin, and Perjeta today. Previously Taxotere has been dose reduced 10%. Patient will also require a total of one year of Herceptin. At the conclusion of cycle 6 of chemotherapy, she will be referred to surgery for lumpectomy. Patient will require adjuvant XRT if she has a lumpectomy. Finally, given the PR positivity of her tumor she will benefit from an aromatase inhibitor for 5 years. Return to clinic in 1 week for reconsideration of cycle 5.    2. Nausea: IV fluids today along with Zofran and Zometa. 3. Diarrhea: Continue Lomotil as needed.  4. Weakness and fatigue: Dose reduction of Taxotere and IV fluids as above. 5. Hypokalemia: Patient receive 40 mg IV potassium today. Continue oral potassium supplementation as prescribed. 6. Hypomagnesemia: Patient will receive 4 g IV magnesium today. Return to clinic in 2-3 days for repeat laboratory work and consideration of additional electrolytes. 7. Joint pain/shortness of breath: Patient was given a Medrol Dosepak.   Patient expressed understanding and was in agreement with this plan. She also understands that She can call clinic at any time with any questions, concerns, or complaints.   Cancer Staging Malignant neoplasm of upper outer quadrant of female breast [facility]) Staging form: Breast, AJCC 8th Edition - Clinical stage from 10/16/2016: Stage IIB (cT2, cN1, cM0, G3, ER: Negative, PR: Positive, HER2: Positive) - Signed by Lloyd Huger, MD on 10/16/2016   Lloyd Huger, MD   01/23/2017 10:37 AM

## 2017-01-22 ENCOUNTER — Telehealth: Payer: Self-pay | Admitting: *Deleted

## 2017-01-22 ENCOUNTER — Encounter: Payer: Self-pay | Admitting: Emergency Medicine

## 2017-01-22 ENCOUNTER — Emergency Department
Admission: EM | Admit: 2017-01-22 | Discharge: 2017-01-22 | Disposition: A | Discharge status: Home / Self Care | Payer: Medicare HMO | Attending: Emergency Medicine | Admitting: Emergency Medicine

## 2017-01-22 ENCOUNTER — Emergency Department: Payer: Medicare HMO

## 2017-01-22 ENCOUNTER — Other Ambulatory Visit: Payer: Self-pay

## 2017-01-22 DIAGNOSIS — Z79899 Other long term (current) drug therapy: Secondary | ICD-10-CM | POA: Diagnosis not present

## 2017-01-22 DIAGNOSIS — R079 Chest pain, unspecified: Secondary | ICD-10-CM | POA: Insufficient documentation

## 2017-01-22 DIAGNOSIS — Z87891 Personal history of nicotine dependence: Secondary | ICD-10-CM | POA: Insufficient documentation

## 2017-01-22 DIAGNOSIS — I10 Essential (primary) hypertension: Secondary | ICD-10-CM | POA: Diagnosis not present

## 2017-01-22 DIAGNOSIS — J449 Chronic obstructive pulmonary disease, unspecified: Secondary | ICD-10-CM | POA: Insufficient documentation

## 2017-01-22 DIAGNOSIS — Z5112 Encounter for antineoplastic immunotherapy: Secondary | ICD-10-CM | POA: Diagnosis not present

## 2017-01-22 LAB — CK: Total CK: 34 U/L — ABNORMAL LOW (ref 38–234)

## 2017-01-22 LAB — BASIC METABOLIC PANEL
ANION GAP: 14 (ref 5–15)
BUN: 16 mg/dL (ref 6–20)
CALCIUM: 7.9 mg/dL — AB (ref 8.9–10.3)
CHLORIDE: 100 mmol/L — AB (ref 101–111)
CO2: 23 mmol/L (ref 22–32)
CREATININE: 1.11 mg/dL — AB (ref 0.44–1.00)
GFR calc Af Amer: 55 mL/min — ABNORMAL LOW (ref >60)
GFR calc non Af Amer: 47 mL/min — ABNORMAL LOW (ref >60)
GLUCOSE: 127 mg/dL — AB (ref 65–99)
Potassium: 2.8 mmol/L — ABNORMAL LOW (ref 3.5–5.1)
Sodium: 137 mmol/L (ref 135–145)

## 2017-01-22 LAB — CBC
HCT: 29.1 % — ABNORMAL LOW (ref 35.0–47.0)
HEMOGLOBIN: 9.9 g/dL — AB (ref 12.0–16.0)
MCH: 32.3 pg (ref 26.0–34.0)
MCHC: 34.1 g/dL (ref 32.0–36.0)
MCV: 94.7 fL (ref 80.0–100.0)
Platelets: 145 10*3/uL — ABNORMAL LOW (ref 150–440)
RBC: 3.07 MIL/uL — AB (ref 3.80–5.20)
RDW: 21.8 % — ABNORMAL HIGH (ref 11.5–14.5)
WBC: 16 10*3/uL — ABNORMAL HIGH (ref 3.6–11.0)

## 2017-01-22 LAB — TROPONIN I

## 2017-01-22 LAB — URIC ACID: Uric Acid, Serum: 7.7 mg/dL — ABNORMAL HIGH (ref 2.3–6.6)

## 2017-01-22 IMAGING — CT CT ANGIO CHEST
2 of 7 series · 18 of 46 positions shown · IV contrast (APPLIED)
Comparison: Radiographs [DATE], CT chest [DATE], [DATE]

CLINICAL DATA: Right-sided breast cancer with chest pain and
shortness of breath

EXAM:
CT ANGIOGRAPHY CHEST WITH CONTRAST
TECHNIQUE: Multidetector CT imaging of the chest was performed using the
standard protocol during bolus administration of intravenous
contrast. Multiplanar CT image reconstructions and MIPs were
obtained to evaluate the vascular anatomy.
CONTRAST:  75 mL Isovue 370 intravenous

[Series 6: thins · axial · 0.72mm/px · z∈[-668,-390]mm · 16 of 311 slices shown]
[im 17/311  lung]
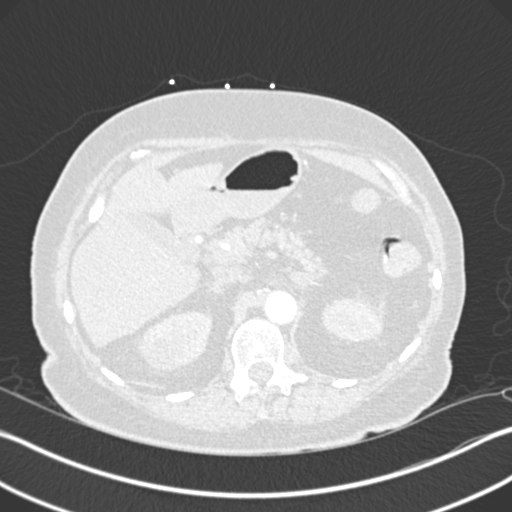
[im 33/311  soft-tissue]
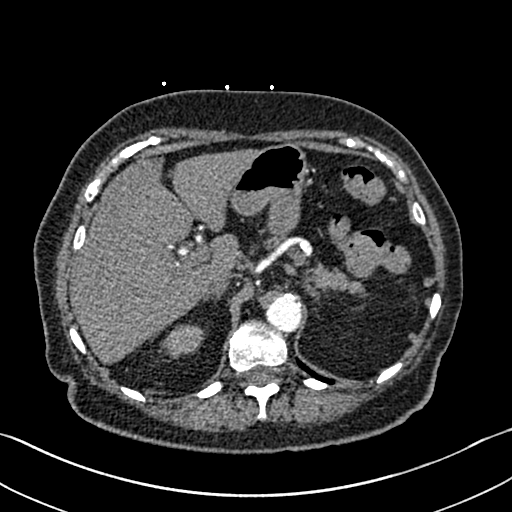
[im 49/311  lung]
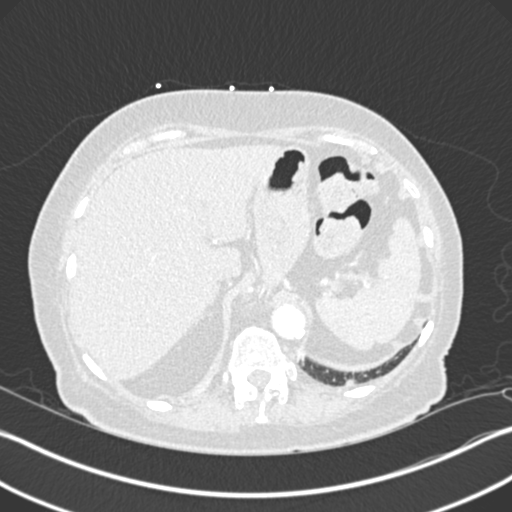
[im 66/311  soft-tissue]
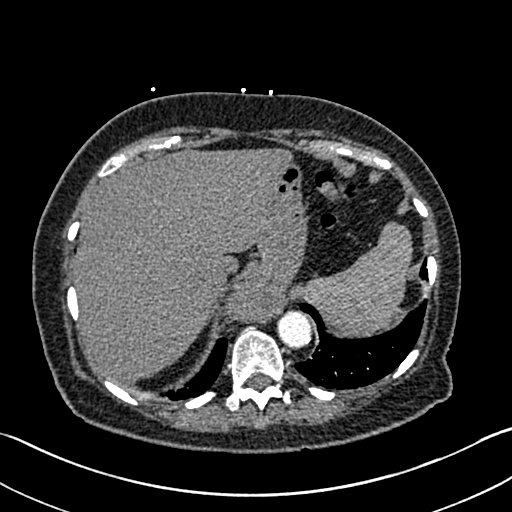
[im 98/311  lung]
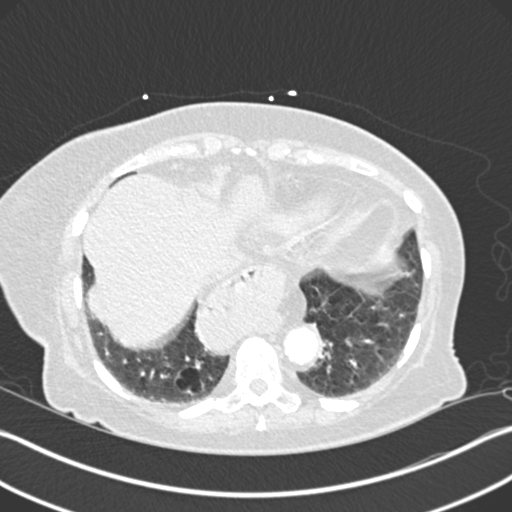
[im 115/311  soft-tissue]
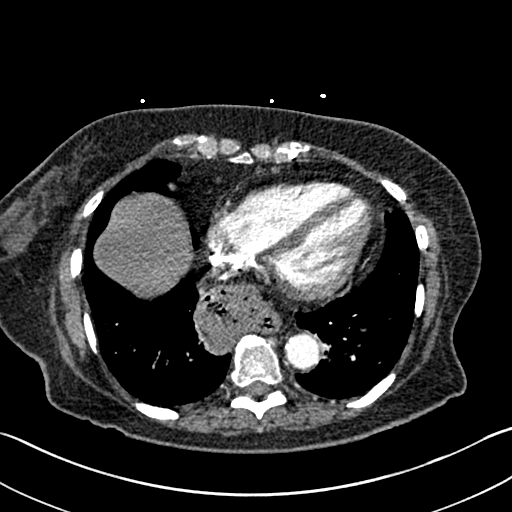
[im 131/311  lung]
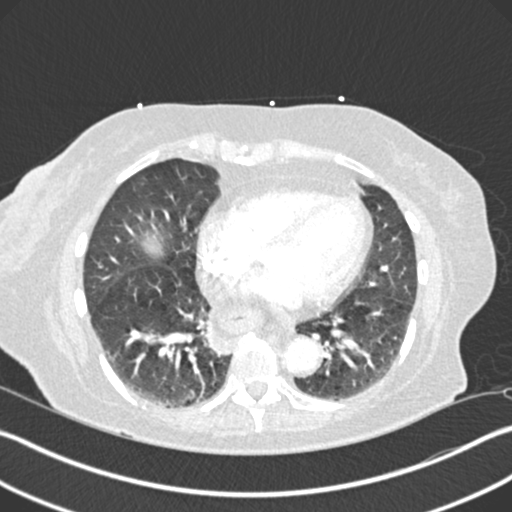
[im 147/311  soft-tissue]
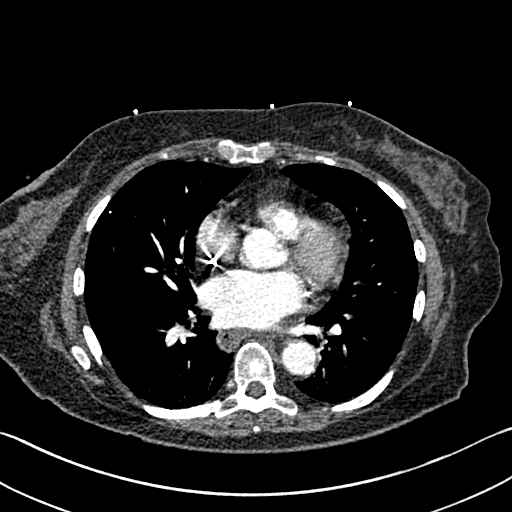
[im 164/311  lung]
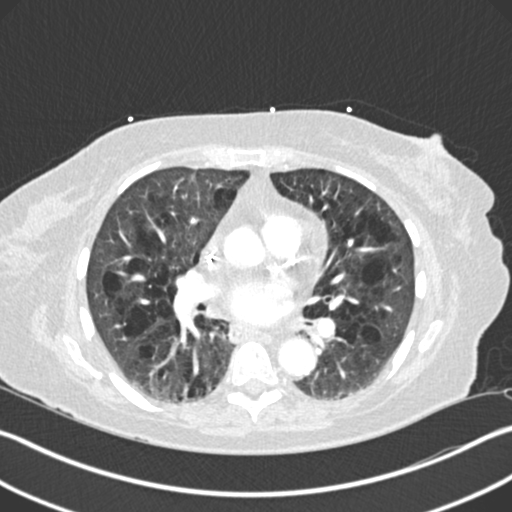
[im 180/311  soft-tissue]
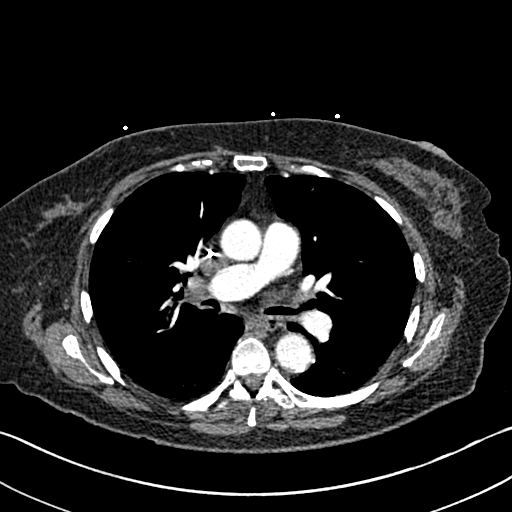
[im 196/311  lung]
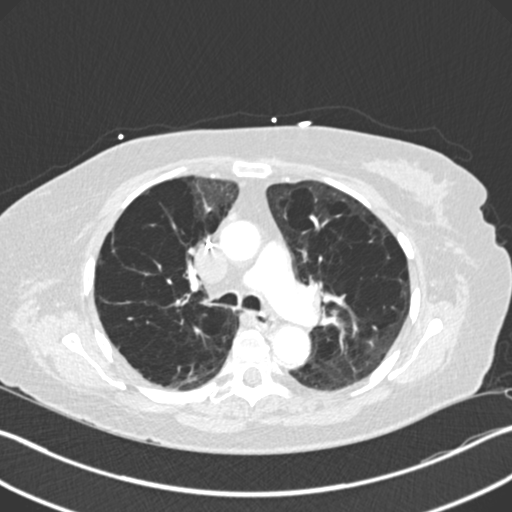
[im 213/311  soft-tissue]
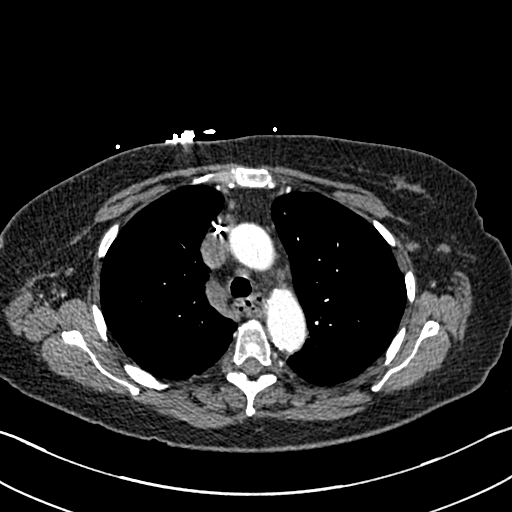
[im 245/311  lung]
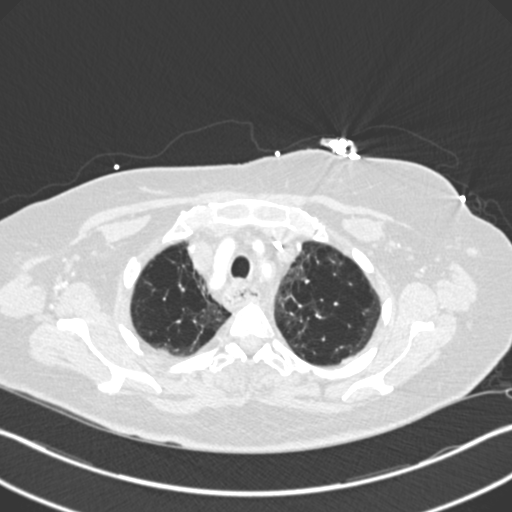
[im 262/311  soft-tissue]
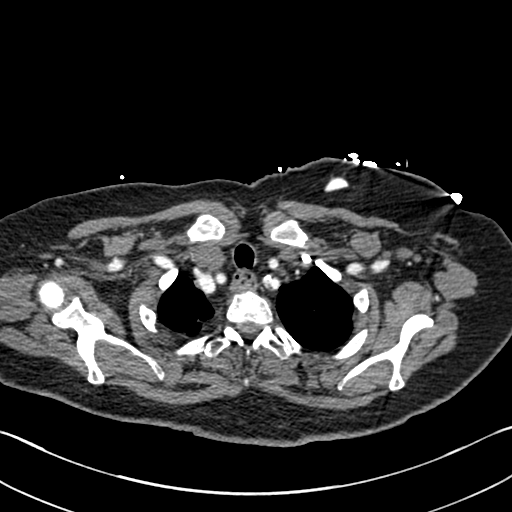
[im 278/311  lung]
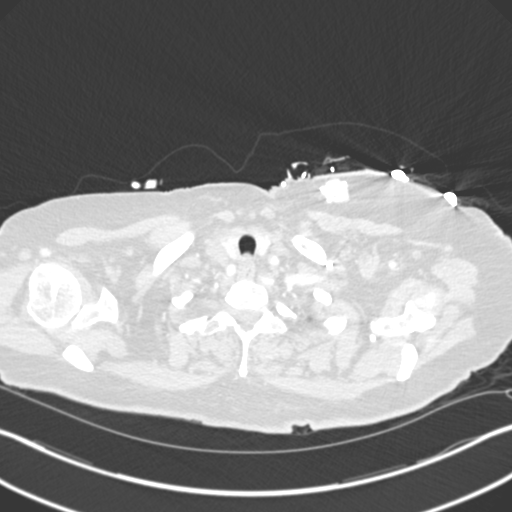
[im 294/311  soft-tissue]
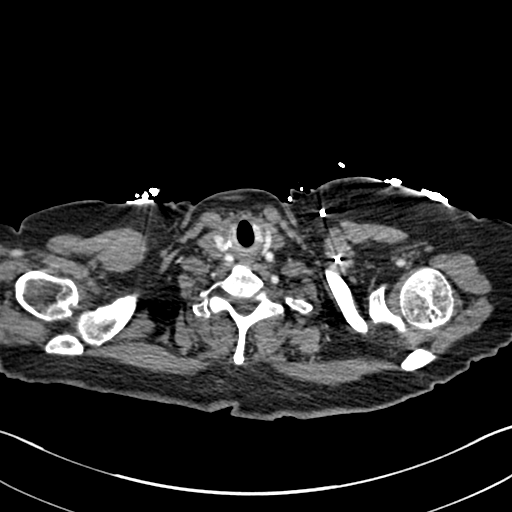

[Series 8: coronal mpr · coronal · 0.61mm/px · 2 of 78 slices shown]
[im 26/78  soft-tissue]
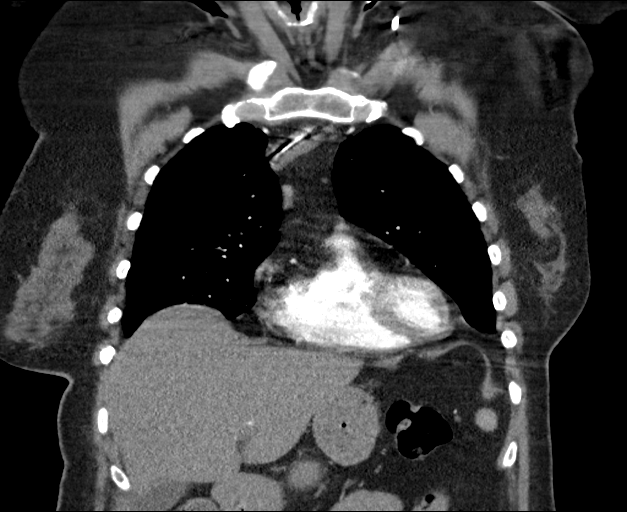
[im 52/78  soft-tissue]
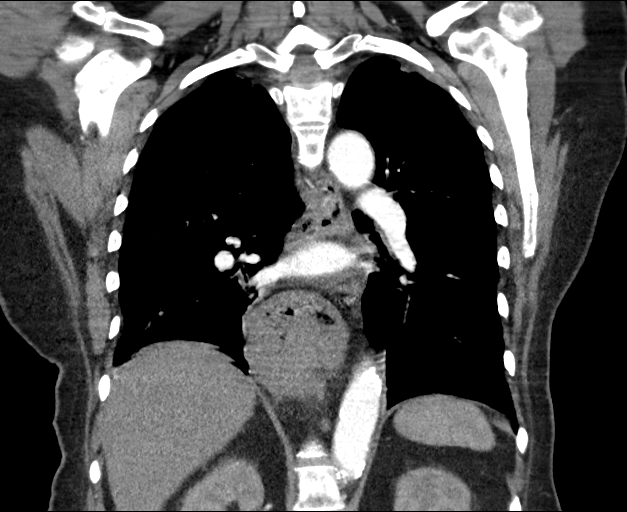

[18 of 46 positions shown; findings below may reference images not displayed]

FINDINGS: Cardiovascular: Satisfactory opacification of the pulmonary arteries
to the segmental level. No evidence of pulmonary embolism. Aortic
atherosclerosis. No dissection is seen. Left-sided central venous
catheter tip terminates in the proximal right atrium. Borderline to
mild cardiomegaly. Coronary artery calcification. No pericardial
effusion

Mediastinum/Nodes: Midline trachea. No thyroid mass. Mild distension
and debris filled esophagus. Nonspecific subcentimeter mediastinal
lymph nodes. Subcentimeter right axillary lymph nodes. Moderate to
large hiatal hernia.

Lungs/Pleura: Moderate severe emphysema. No consolidation, pleural
effusion or pneumothorax

Upper Abdomen: Stable low-density lesion in the upper pole of right
kidney, intermediate density values but no change in size since
[ZO], favoring benign process. No acute abnormality.

Musculoskeletal: No definite acute or suspicious bone lesion is
seen.

Review of the MIP images confirms the above findings.
IMPRESSION: 1. Negative for acute pulmonary embolus or aortic dissection
2. Moderate severe emphysema without consolidation or effusion
3. Moderate hiatal hernia with diffuse esophageal distension and
debris
4. Stable 2.6 cm low-density lesion in the upper pole right kidney,
favor benign process given lack of interval growth since [ZO]

Aortic Atherosclerosis ([ZO]-[ZO]) and Emphysema ([ZO]-[ZO]).

## 2017-01-22 IMAGING — CR DG CHEST 2V
1 series · 2 of 2 positions shown · non-contrast
Comparison: [DATE]

CLINICAL DATA: Chest pain and shortness of Breath

EXAM:
CHEST  2 VIEW

[Series 1: w chest lat · 0.14mm/px · 2 of 2 slices shown]
[im 1/2]
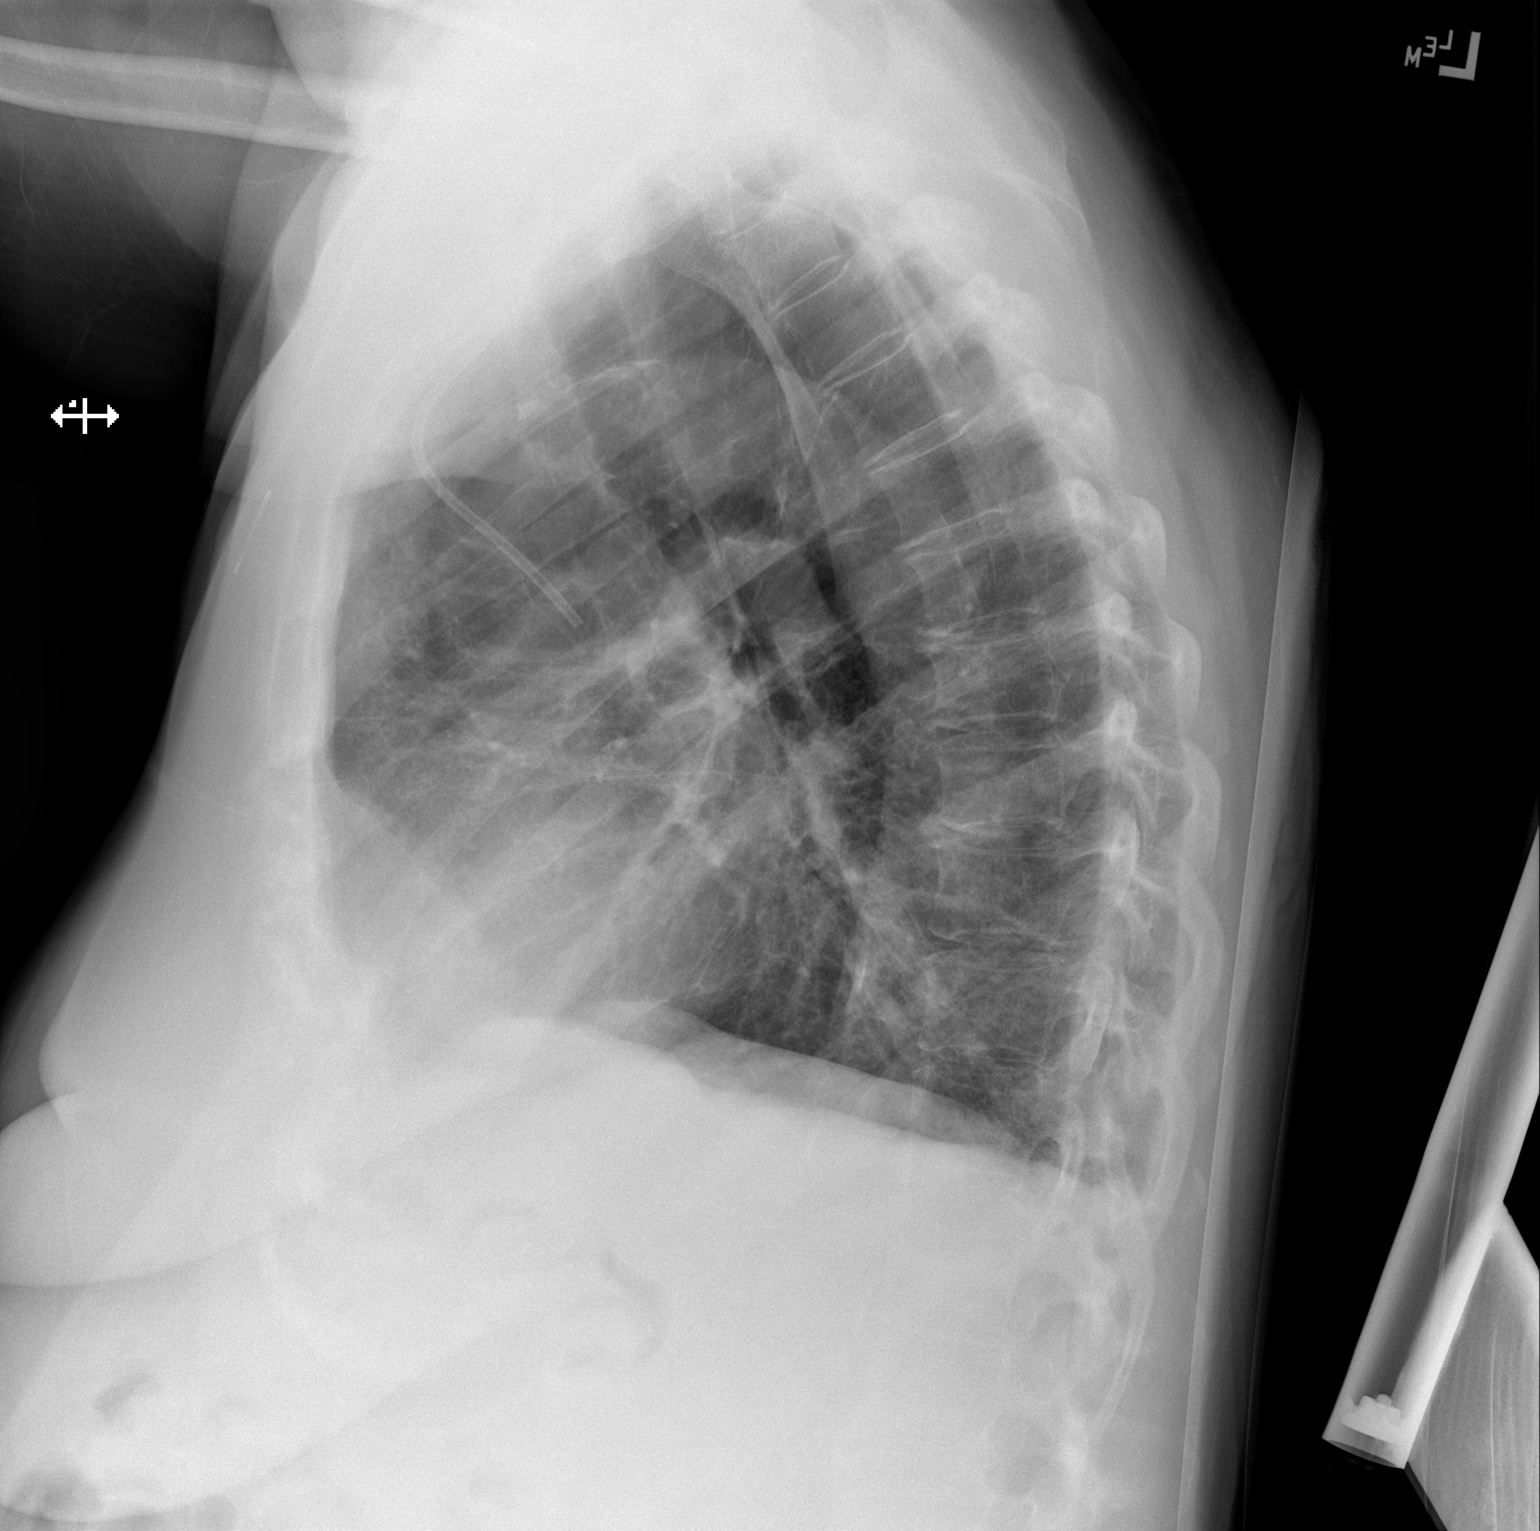
[im 2/2]
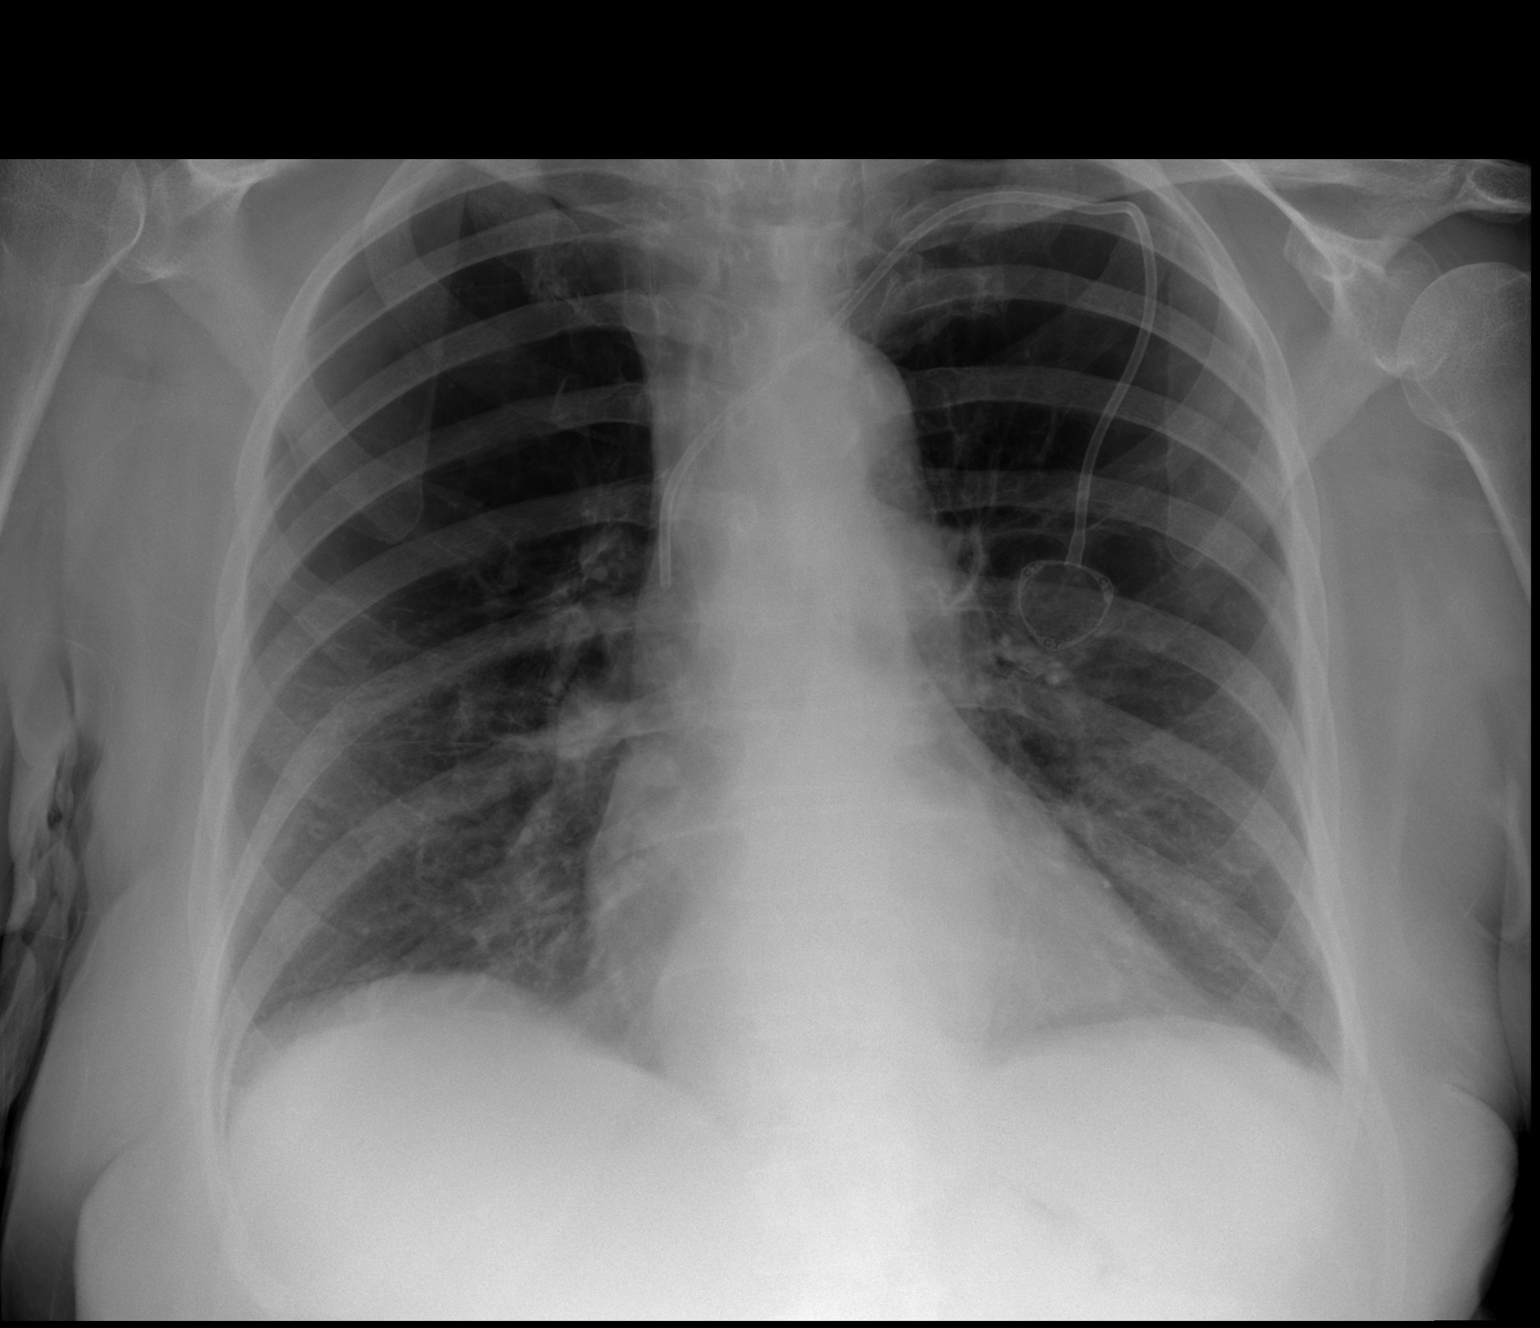

[2 of 2 positions shown; findings below may reference images not displayed]

FINDINGS: Cardiac shadow is within normal limits. Left chest wall port is seen
in satisfactory position. Emphysematous changes are noted in both
lungs in the upper lobes but stable. No focal infiltrate or sizable
effusion is seen. Mild degenerative changes of thoracic spine are
noted.
IMPRESSION: COPD without acute abnormality. The overall appearance is stable
from the prior study.

## 2017-01-22 MED ORDER — HEPARIN SOD (PORK) LOCK FLUSH 100 UNIT/ML IV SOLN
INTRAVENOUS | Status: AC
  Administered 2017-01-22: 500 [IU]
  Filled 2017-01-22: qty 5

## 2017-01-22 MED ORDER — OXYCODONE-ACETAMINOPHEN 5-325 MG PO TABS
2.0000 | ORAL_TABLET | Freq: Once | ORAL | Status: AC
  Administered 2017-01-22: 2 via ORAL
  Filled 2017-01-22: qty 2

## 2017-01-22 MED ORDER — MORPHINE SULFATE (PF) 4 MG/ML IV SOLN
4.0000 mg | Freq: Once | INTRAVENOUS | Status: AC
  Administered 2017-01-22: 4 mg via INTRAVENOUS
  Filled 2017-01-22: qty 1

## 2017-01-22 MED ORDER — IOPAMIDOL (ISOVUE-370) INJECTION 76%
75.0000 mL | Freq: Once | INTRAVENOUS | Status: AC | PRN
  Administered 2017-01-22: 75 mL via INTRAVENOUS

## 2017-01-22 MED ORDER — ONDANSETRON HCL 4 MG/2ML IJ SOLN
4.0000 mg | Freq: Once | INTRAMUSCULAR | Status: AC
  Administered 2017-01-22: 4 mg via INTRAVENOUS
  Filled 2017-01-22: qty 2

## 2017-01-22 MED ORDER — OXYCODONE-ACETAMINOPHEN 5-325 MG PO TABS: 1.0000 | ORAL_TABLET | Freq: Four times a day (QID) | ORAL | 0 refills | Status: DC | PRN

## 2017-01-22 MED ORDER — SODIUM CHLORIDE 0.9 % IV BOLUS (SEPSIS)
1000.0000 mL | Freq: Once | INTRAVENOUS | Status: AC
  Administered 2017-01-22: 1000 mL via INTRAVENOUS

## 2017-01-22 NOTE — ED Notes (Signed)
Pt taken to CT.

## 2017-01-22 NOTE — ED Provider Notes (Signed)
[facility] Emergency Department Provider Note  Time seen: 6:19 PM  I have reviewed the triage vital signs and the nursing notes.   HISTORY  Chief Complaint Chest Pain    HPI Krista Cisneros is a 76 y.o. female With a past medical history of anxiety, arthritis, COPD, gastric reflux, who presents to the emergency department for chest pain and generalized body aches. Patient currently undergoing chemotherapy for breast cancer. States this is week 4 of 6 of chemotherapy.  patient states 4 days ago she started with central chest pain, somewhat worse with deep inspiration. She states over the past 2 days the pain has spread into her arms and neck as well. States the chest pain is worse with deep inspiration. Denies any cough or fever.  Past Medical History:  Diagnosis Date  . Anxiety   . Arthritis   . Cancer [facility])    Right Breast Cancer  . COPD (chronic obstructive pulmonary disease) ([place])   . Dyspnea    with exertion  . GERD (gastroesophageal reflux disease)   . Hypertension     Patient Active Problem List   Diagnosis Date Noted  . Goals of care, counseling/discussion 10/20/2016  . Malignant neoplasm of upper outer quadrant of female breast ([place]) 10/16/2016  . Hyperlipidemia, mixed 10/08/2016  . Chronic venous insufficiency 09/14/2016  . GERD (gastroesophageal reflux disease) 09/14/2016  . Pain in limb 05/29/2016  . Essential hypertension 05/29/2016  . COPD (chronic obstructive pulmonary disease) ([place]) 05/29/2016  . Hardening of the aorta (main artery of the heart) ([place]) 01/07/2016  . Hyperglycemia, unspecified 01/07/2016  . Neuropathy 11/15/2015  . Severe recurrent major depression without psychotic features ([place]) 09/10/2015  . Allergic rhinitis 04/10/2014  . Depression 04/10/2014  . Obesity, unspecified 04/10/2014    Past Surgical History:  Procedure Laterality Date  . ABDOMINAL HYSTERECTOMY  1990   Partial  . BREAST BIOPSY Right 10/04/2016    axilla lymph node and axillay tail mass biopsy. Path pending  . DILATION AND CURETTAGE OF UTERUS    . PORTACATH PLACEMENT Left 10/24/2016   Procedure: INSERTION PORT-A-CATH;  Surgeon: [name], MD;  Location: [facility] ORS;  Service: General;  Laterality: Left;    Prior to Admission medications   Medication Sig Start Date End Date Taking? Authorizing Provider  ADVAIR DISKUS 500-50 MCG/DOSE AEPB Inhale 1 puff into the lungs 2 (two) times daily.  05/04/16   [provider]  ALPRAZolam Duanne Moron) 0.25 MG tablet Take 1 tablet (0.25 mg total) by mouth at bedtime as needed for anxiety. 10/17/16   Lloyd Huger, MD  co-enzyme Q-10 30 MG capsule Take 30 mg by mouth daily.    [provider]  diphenoxylate-atropine (LOMOTIL) 2.5-0.025 MG tablet Take 1 tablet by mouth 4 (four) times daily as needed for diarrhea or loose stools. 12/18/16   Lloyd Huger, MD  DULoxetine (CYMBALTA) 60 MG capsule Take 1 capsule (60 mg total) by mouth daily. 12/15/14   Kathrine Haddock, NP  lidocaine-prilocaine (EMLA) cream Apply to affected area once 10/20/16   Lloyd Huger, MD  losartan-hydrochlorothiazide (HYZAAR) 100-25 MG tablet Take 1 tablet by mouth daily.  05/12/16   [provider]  montelukast (SINGULAIR) 10 MG tablet Take 10 mg by mouth daily.  05/23/16   [provider]  Omega-3 Fatty Acids (FISH OIL PO) Take 1 capsule by mouth daily.     [provider]  omeprazole (PRILOSEC) 20 MG capsule Take 20 mg by mouth daily.  [provider]  ondansetron (ZOFRAN) 8 MG tablet Take 1 tablet (8 mg total) by mouth 2 (two) times daily as needed for refractory nausea / vomiting. 10/20/16   Lloyd Huger, MD  Potassium Chloride ER 20 MEQ TBCR Take 1 tablet by mouth daily. 12/18/16   Lloyd Huger, MD  prochlorperazine (COMPAZINE) 10 MG tablet Take 1 tablet (10 mg total) by mouth every 6 (six) hours as needed (Nausea or vomiting). 10/20/16   Lloyd Huger, MD  promethazine (PHENERGAN) 25 MG tablet TAKE 1 TABLET BY MOUTH EVERY 6-8 HOURS AS NEEDED FOR NAUSEA 11/04/16   [provider]  triamcinolone ointment (KENALOG) 0.5 % Apply 1 application topically 2 (two) times daily. Rash on bilateral upper extremities 11/29/16   Jacquelin Hawking, NP    Allergies  Allergen Reactions  . Atorvastatin Other (See Comments)    Muscle Pain  . Gabapentin Swelling  . Penicillins Swelling    Family History  Problem Relation Age of Onset  . Colon cancer Mother   . Breast cancer Neg Hx     Social History Social History  Substance Use Topics  . Smoking status: Former Smoker    Packs/day: 0.50    Types: Cigarettes    Quit date: 07/23/1988  . Smokeless tobacco: Never Used  . Alcohol use No    Review of Systems Constitutional: Negative for fever. Cardiovascular: positive for chest pain worse with deep inspiration Respiratory: Negative for shortness of breath. Gastrointestinal: Negative for abdominal pain, vomiting Musculoskeletal: somewhat diffuse body aches including bilateral arms and neck and chest. Neurological: Negative for headache All other ROS negative  ____________________________________________   PHYSICAL EXAM:  VITAL SIGNS: ED Triage Vitals [01/22/17 1453]  Enc Vitals Group     BP 137/71     Pulse Rate 98     Resp 20     Temp 99.2 F (37.3 C)     Temp Source Oral     SpO2 94 %     Weight      Height      Head Circumference      Peak Flow      Pain Score 8     Pain Loc      Pain Edu?      Excl. in [place]?     Constitutional: Alert and oriented. Well appearing and in no distress. Eyes: Normal exam ENT   Head: Normocephalic and atraumatic.   Mouth/Throat: Mucous membranes are moist. Cardiovascular: Normal rate, regular rhythm. Respiratory: Normal respiratory effort without tachypnea nor retractions. Breath sounds are clear  Gastrointestinal: Soft and nontender. No distention.  Musculoskeletal: she does  have tenderness of the left hand, she states this is somewhat chronic but has been worsening. History of arthritis. No appreciable edema or erythema. Moderate tenderness to palpation. No arm edema or proximal tenderness. Other extremities are normal. Neurologic:  Normal speech and language. No gross focal neurologic deficits  Skin:  Skin is warm, dry and intact.  Psychiatric: Mood and affect are normal.   ____________________________________________    EKG  EKG reviewed and interpreted by myself shows sinus tachycardia 101 bpm, narrow QRS, normal axis, normal intervals, nonspecific but no concerning ST changes.  ____________________________________________    RADIOLOGY  chest x-ray shows COPD without acute abnormality.  ____________________________________________   INITIAL IMPRESSION / ASSESSMENT AND PLAN / ED COURSE  Pertinent labs & imaging results that were available during my care of the patient were reviewed by me and considered in  my medical decision making (see chart for details).  patient presents the emergency department for chest pain worse with deep inspiration and diffuse body aches. Differential this time include ACS, PE, pneumothorax, pneumonia, chest wall pain, pleurisy. We will check labs, chest x-ray and continue to closely monitor.  Labs are largely at baseline with a moderate leukocytosis but the patient is on Neulasta. Chest x-ray shows no acute changes given the patient's pleuritic nature of her pain we will obtain a CT angiography to rule out pulmonary embolus. Uric acid and creatinine kinase levels are pending.  add on labs are largely within normal limits, slight uric acid elevation. We will discharge with pain medication. Patient states she is feeling well and is ready to go home.  ____________________________________________   FINAL CLINICAL IMPRESSION(S) / ED DIAGNOSES  chest pain bodyaches    Harvest Dark, MD 01/22/17 2106

## 2017-01-22 NOTE — Telephone Encounter (Signed)
Daughter called to report that patient is having chest pain, neck pain, and pain in her hand since Thursday. It is getting worse. She has an appointment tomorrow, but is asking if she needs to do anything today. I returned call and advised she take her to the ER. She stated she will take her there

## 2017-01-22 NOTE — ED Triage Notes (Signed)
Pt sent from home by Dr Grayland Ormond for further eval of chest pain and  Shortness of breath. Pt currently being treated for Breast Cancer.

## 2017-01-22 NOTE — ED Notes (Addendum)
Pt has R sided breast CA, been receiving chemo q3 weeks. Pt has had neck stiffness, CP, and pain in between shoulder blades x 2 days. SOB when moving. States pain with deep breath. Hx COPD, does NOT wear oxygen. Alert, oriented, port L chest. Family at bedside.  Denies fevers, N&V&D associated with this pain. Pt has diarrhea after receiving chemo she states. States "pain all over." states she doesn't want to be kept d/t chemo tomorrow.   Pt also c/o L hand pain. Family states pt has arthritis, has been using voltaren gel without relief.

## 2017-01-23 ENCOUNTER — Inpatient Hospital Stay: Payer: Medicare HMO

## 2017-01-23 ENCOUNTER — Inpatient Hospital Stay: Payer: Medicare HMO | Attending: Oncology | Admitting: Oncology

## 2017-01-23 ENCOUNTER — Encounter: Payer: Self-pay | Admitting: Oncology

## 2017-01-23 VITALS — BP 136/75 | HR 96 | Temp 99.9°F | Wt 165.5 lb

## 2017-01-23 DIAGNOSIS — R601 Generalized edema: Secondary | ICD-10-CM | POA: Insufficient documentation

## 2017-01-23 DIAGNOSIS — Z87891 Personal history of nicotine dependence: Secondary | ICD-10-CM | POA: Insufficient documentation

## 2017-01-23 DIAGNOSIS — I700 Atherosclerosis of aorta: Secondary | ICD-10-CM | POA: Insufficient documentation

## 2017-01-23 DIAGNOSIS — Z7689 Persons encountering health services in other specified circumstances: Secondary | ICD-10-CM | POA: Diagnosis not present

## 2017-01-23 DIAGNOSIS — M2550 Pain in unspecified joint: Secondary | ICD-10-CM | POA: Insufficient documentation

## 2017-01-23 DIAGNOSIS — Z5111 Encounter for antineoplastic chemotherapy: Secondary | ICD-10-CM | POA: Diagnosis not present

## 2017-01-23 DIAGNOSIS — E876 Hypokalemia: Secondary | ICD-10-CM | POA: Insufficient documentation

## 2017-01-23 DIAGNOSIS — C50411 Malignant neoplasm of upper-outer quadrant of right female breast: Secondary | ICD-10-CM | POA: Insufficient documentation

## 2017-01-23 DIAGNOSIS — R0602 Shortness of breath: Secondary | ICD-10-CM | POA: Diagnosis not present

## 2017-01-23 DIAGNOSIS — E860 Dehydration: Secondary | ICD-10-CM | POA: Insufficient documentation

## 2017-01-23 DIAGNOSIS — Z171 Estrogen receptor negative status [ER-]: Secondary | ICD-10-CM | POA: Insufficient documentation

## 2017-01-23 DIAGNOSIS — Z79899 Other long term (current) drug therapy: Secondary | ICD-10-CM | POA: Diagnosis not present

## 2017-01-23 DIAGNOSIS — I10 Essential (primary) hypertension: Secondary | ICD-10-CM | POA: Insufficient documentation

## 2017-01-23 DIAGNOSIS — F419 Anxiety disorder, unspecified: Secondary | ICD-10-CM | POA: Diagnosis not present

## 2017-01-23 DIAGNOSIS — J449 Chronic obstructive pulmonary disease, unspecified: Secondary | ICD-10-CM | POA: Diagnosis not present

## 2017-01-23 DIAGNOSIS — R110 Nausea: Secondary | ICD-10-CM | POA: Diagnosis not present

## 2017-01-23 DIAGNOSIS — K219 Gastro-esophageal reflux disease without esophagitis: Secondary | ICD-10-CM | POA: Diagnosis not present

## 2017-01-23 DIAGNOSIS — N179 Acute kidney failure, unspecified: Secondary | ICD-10-CM | POA: Diagnosis not present

## 2017-01-23 DIAGNOSIS — K449 Diaphragmatic hernia without obstruction or gangrene: Secondary | ICD-10-CM | POA: Diagnosis not present

## 2017-01-23 DIAGNOSIS — R531 Weakness: Secondary | ICD-10-CM | POA: Diagnosis not present

## 2017-01-23 DIAGNOSIS — R197 Diarrhea, unspecified: Secondary | ICD-10-CM | POA: Diagnosis not present

## 2017-01-23 LAB — CBC WITH DIFFERENTIAL/PLATELET
BASOS ABS: 0.1 10*3/uL (ref 0–0.1)
BASOS PCT: 1 %
EOS ABS: 0 10*3/uL (ref 0–0.7)
EOS PCT: 0 %
HCT: 26.2 % — ABNORMAL LOW (ref 35.0–47.0)
Hemoglobin: 9 g/dL — ABNORMAL LOW (ref 12.0–16.0)
Lymphocytes Relative: 11 %
Lymphs Abs: 1.7 10*3/uL (ref 1.0–3.6)
MCH: 32.4 pg (ref 26.0–34.0)
MCHC: 34.2 g/dL (ref 32.0–36.0)
MCV: 94.5 fL (ref 80.0–100.0)
Monocytes Absolute: 2 10*3/uL — ABNORMAL HIGH (ref 0.2–0.9)
Monocytes Relative: 12 %
NEUTROS PCT: 76 %
Neutro Abs: 12.1 10*3/uL — ABNORMAL HIGH (ref 1.4–6.5)
PLATELETS: 154 10*3/uL (ref 150–440)
RBC: 2.77 MIL/uL — AB (ref 3.80–5.20)
RDW: 22.3 % — ABNORMAL HIGH (ref 11.5–14.5)
WBC: 15.9 10*3/uL — AB (ref 3.6–11.0)

## 2017-01-23 LAB — COMPREHENSIVE METABOLIC PANEL
ALBUMIN: 2.6 g/dL — AB (ref 3.5–5.0)
ALT: 12 U/L — AB (ref 14–54)
AST: 18 U/L (ref 15–41)
Alkaline Phosphatase: 75 U/L (ref 38–126)
Anion gap: 11 (ref 5–15)
BUN: 18 mg/dL (ref 6–20)
CHLORIDE: 96 mmol/L — AB (ref 101–111)
CO2: 24 mmol/L (ref 22–32)
CREATININE: 1.06 mg/dL — AB (ref 0.44–1.00)
Calcium: 7.5 mg/dL — ABNORMAL LOW (ref 8.9–10.3)
GFR calc non Af Amer: 50 mL/min — ABNORMAL LOW (ref >60)
GFR, EST AFRICAN AMERICAN: 58 mL/min — AB (ref >60)
GLUCOSE: 155 mg/dL — AB (ref 65–99)
Potassium: 2.9 mmol/L — ABNORMAL LOW (ref 3.5–5.1)
SODIUM: 131 mmol/L — AB (ref 135–145)
Total Bilirubin: 0.6 mg/dL (ref 0.3–1.2)
Total Protein: 7.2 g/dL (ref 6.5–8.1)

## 2017-01-23 LAB — MAGNESIUM: Magnesium: 0.8 mg/dL — CL (ref 1.7–2.4)

## 2017-01-23 MED ORDER — SODIUM CHLORIDE 0.9 % IV SOLN
Freq: Once | INTRAVENOUS | Status: DC
  Filled 2017-01-23: qty 1000

## 2017-01-23 MED ORDER — MORPHINE SULFATE 2 MG/ML IJ SOLN
2.0000 mg | Freq: Once | INTRAMUSCULAR | Status: AC
  Administered 2017-01-23: 2 mg via INTRAVENOUS
  Filled 2017-01-23: qty 1

## 2017-01-23 MED ORDER — SODIUM CHLORIDE 0.9 % IV SOLN: 4.0000 g | Freq: Once | INTRAVENOUS | Status: DC

## 2017-01-23 MED ORDER — HEPARIN SOD (PORK) LOCK FLUSH 100 UNIT/ML IV SOLN
500.0000 [IU] | Freq: Once | INTRAVENOUS | Status: AC
  Administered 2017-01-23: 500 [IU] via INTRAVENOUS
  Filled 2017-01-23: qty 5

## 2017-01-23 MED ORDER — DEXAMETHASONE SODIUM PHOSPHATE 100 MG/10ML IJ SOLN
Freq: Once | INTRAMUSCULAR | Status: AC
  Administered 2017-01-23: 11:00:00 via INTRAVENOUS
  Filled 2017-01-23: qty 4

## 2017-01-23 MED ORDER — MAGNESIUM SULFATE 4 GM/100ML IV SOLN: 4.0000 g | Freq: Once | INTRAVENOUS | Status: DC

## 2017-01-23 MED ORDER — SODIUM CHLORIDE 0.9 % IV SOLN
Freq: Once | INTRAVENOUS | Status: AC
  Administered 2017-01-23: 11:00:00 via INTRAVENOUS
  Filled 2017-01-23: qty 1000

## 2017-01-23 MED ORDER — SODIUM CHLORIDE 0.9% FLUSH
10.0000 mL | Freq: Once | INTRAVENOUS | Status: AC
  Administered 2017-01-23: 10 mL via INTRAVENOUS
  Filled 2017-01-23: qty 10

## 2017-01-23 MED ORDER — PREDNISONE 10 MG (21) PO TBPK: ORAL_TABLET | ORAL | 0 refills | Status: DC

## 2017-01-24 ENCOUNTER — Other Ambulatory Visit: Payer: Self-pay | Admitting: *Deleted

## 2017-01-25 ENCOUNTER — Inpatient Hospital Stay: Payer: Medicare HMO

## 2017-01-25 DIAGNOSIS — Z5111 Encounter for antineoplastic chemotherapy: Secondary | ICD-10-CM | POA: Diagnosis not present

## 2017-01-25 DIAGNOSIS — C50411 Malignant neoplasm of upper-outer quadrant of right female breast: Secondary | ICD-10-CM

## 2017-01-25 LAB — COMPREHENSIVE METABOLIC PANEL
ALBUMIN: 2.5 g/dL — AB (ref 3.5–5.0)
ALT: 19 U/L (ref 14–54)
ANION GAP: 10 (ref 5–15)
AST: 31 U/L (ref 15–41)
Alkaline Phosphatase: 70 U/L (ref 38–126)
BILIRUBIN TOTAL: 0.3 mg/dL (ref 0.3–1.2)
BUN: 23 mg/dL — AB (ref 6–20)
CHLORIDE: 103 mmol/L (ref 101–111)
CO2: 23 mmol/L (ref 22–32)
Calcium: 8.6 mg/dL — ABNORMAL LOW (ref 8.9–10.3)
Creatinine, Ser: 1.15 mg/dL — ABNORMAL HIGH (ref 0.44–1.00)
GFR calc Af Amer: 53 mL/min — ABNORMAL LOW (ref >60)
GFR calc non Af Amer: 45 mL/min — ABNORMAL LOW (ref >60)
GLUCOSE: 155 mg/dL — AB (ref 65–99)
POTASSIUM: 3.7 mmol/L (ref 3.5–5.1)
Sodium: 136 mmol/L (ref 135–145)
TOTAL PROTEIN: 6.8 g/dL (ref 6.5–8.1)

## 2017-01-25 LAB — CBC WITH DIFFERENTIAL/PLATELET
BASOS ABS: 0 10*3/uL (ref 0–0.1)
BASOS PCT: 0 %
EOS ABS: 0 10*3/uL (ref 0–0.7)
Eosinophils Relative: 0 %
HEMATOCRIT: 25.2 % — AB (ref 35.0–47.0)
Hemoglobin: 8.6 g/dL — ABNORMAL LOW (ref 12.0–16.0)
Lymphocytes Relative: 12 %
Lymphs Abs: 1.5 10*3/uL (ref 1.0–3.6)
MCH: 32.4 pg (ref 26.0–34.0)
MCHC: 34.3 g/dL (ref 32.0–36.0)
MCV: 94.5 fL (ref 80.0–100.0)
Monocytes Absolute: 0.7 10*3/uL (ref 0.2–0.9)
Monocytes Relative: 5 %
NEUTROS PCT: 83 %
Neutro Abs: 10.6 10*3/uL — ABNORMAL HIGH (ref 1.4–6.5)
PLATELETS: 193 10*3/uL (ref 150–440)
RBC: 2.66 MIL/uL — ABNORMAL LOW (ref 3.80–5.20)
RDW: 22.1 % — AB (ref 11.5–14.5)
WBC: 12.8 10*3/uL — AB (ref 3.6–11.0)

## 2017-01-25 LAB — MAGNESIUM: MAGNESIUM: 1.6 mg/dL — AB (ref 1.7–2.4)

## 2017-01-25 MED ORDER — MAGNESIUM SULFATE 2 GM/50ML IV SOLN
2.0000 g | Freq: Once | INTRAVENOUS | Status: AC
  Administered 2017-01-25: 2 g via INTRAVENOUS
  Filled 2017-01-25: qty 50

## 2017-01-25 MED ORDER — HEPARIN SOD (PORK) LOCK FLUSH 100 UNIT/ML IV SOLN
500.0000 [IU] | Freq: Once | INTRAVENOUS | Status: AC
  Administered 2017-01-25: 500 [IU] via INTRAVENOUS
  Filled 2017-01-25: qty 5

## 2017-01-25 MED ORDER — OXYCODONE-ACETAMINOPHEN 5-325 MG PO TABS: 1.0000 | ORAL_TABLET | Freq: Four times a day (QID) | ORAL | 0 refills | Status: DC | PRN

## 2017-01-25 MED ORDER — SODIUM CHLORIDE 0.9 % IV SOLN
INTRAVENOUS | Status: DC
  Administered 2017-01-25: 09:00:00 via INTRAVENOUS
  Filled 2017-01-25 (×2): qty 1000

## 2017-01-25 MED ORDER — SODIUM CHLORIDE 0.9% FLUSH
10.0000 mL | Freq: Once | INTRAVENOUS | Status: AC
  Administered 2017-01-25: 10 mL via INTRAVENOUS
  Filled 2017-01-25: qty 10

## 2017-01-25 MED ORDER — MAGNESIUM SULFATE 50 % IJ SOLN: 2.0000 g | Freq: Once | INTRAMUSCULAR | Status: DC

## 2017-01-25 NOTE — Progress Notes (Signed)
Pt requesting RX for pain meds, Dr Grayland Ormond notified and RX given to pt to have filled. K is WNL today, Mg 1.6 supplemental IV Mg administered. Pt educated on nutritional resources for Mg and K, pt verbalizes understanding.

## 2017-01-28 NOTE — Progress Notes (Signed)
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  INTERVAL HISTORY: Patient returns to clinic today for further evaluation and re-consideration of cycle 5 of 6 of Taxotere, carboplatinum, Herceptin, and Perjeta.Last week she did not receive treatment due to shortness of breath and left wrist pain. Today, she feels much better. She was given a prednisone Dosepak and finished yesterday. She denies shortness of breath, chest pain, fevers or illnesses. She continues to complain of diarrhea but it is managed with Lomotil. She additionally complains of mild swelling in her bilateral lower extremities but states she thinks is from the prednisone. Her daughter states that her appetite has improved and her weakness and fatigue is much better.  REVIEW OF SYSTEMS:   Review of Systems  Constitutional: Negative for fever, malaise/fatigue and weight loss.  Respiratory: Negative for cough and shortness of breath.   Cardiovascular: Negative.  Negative for chest pain and leg swelling.  Gastrointestinal: Negative for abdominal pain, diarrhea and nausea.  Genitourinary: Negative.   Musculoskeletal: Negative for joint pain.  Skin: Negative.  Negative for rash.  Neurological: Negative for weakness.  Psychiatric/Behavioral: Negative.  The patient is not nervous/anxious.     As per HPI. Otherwise, a complete review of systems is negative.  PAST MEDICAL HISTORY: Past Medical History:  Diagnosis Date  . Anxiety   . Arthritis   . Cancer [facility])    Right Breast Cancer  . COPD (chronic obstructive pulmonary disease) ([place])   . Dyspnea    with exertion  . GERD (gastroesophageal reflux disease)   .  Hypertension     PAST SURGICAL HISTORY: Past Surgical History:  Procedure Laterality Date  . ABDOMINAL HYSTERECTOMY  1990   Partial  . BREAST BIOPSY Right 10/04/2016   axilla lymph node and axillay tail mass biopsy. Path pending  . DILATION AND CURETTAGE OF UTERUS    . PORTACATH PLACEMENT Left 10/24/2016   Procedure: INSERTION PORT-A-CATH;  Surgeon: [name], MD;  Location: [facility] ORS;  Service: General;  Laterality: Left;    FAMILY HISTORY: Family History  Problem Relation Age of Onset  . Colon cancer Mother   . Breast cancer Neg Hx     ADVANCED DIRECTIVES (Y/N):  N  HEALTH MAINTENANCE: Social History  Substance Use Topics  . Smoking status: Former Smoker    Packs/day: 0.50    Types: Cigarettes    Quit date: 07/23/1988  . Smokeless tobacco: Never Used  . Alcohol use No     Colonoscopy:  PAP:  Bone density:  Lipid panel:  Allergies  Allergen Reactions  . Atorvastatin Other (See Comments)    Muscle Pain  . Gabapentin Swelling  . Penicillins Swelling    Current Outpatient Prescriptions  Medication Sig Dispense Refill  . ADVAIR DISKUS 500-50 MCG/DOSE AEPB Inhale 1 puff into the lungs 2 (two) times daily.     Marland Kitchen ALPRAZolam (XANAX) 0.25 MG tablet Take 1 tablet (0.25 mg total) by mouth at bedtime as needed for anxiety. 30 tablet 0  . baclofen (LIORESAL) 10 MG tablet Take 10 mg by mouth at bedtime.  9  . co-enzyme Q-10 30 MG capsule Take 30 mg by mouth daily.    . diphenoxylate-atropine (LOMOTIL) 2.5-0.025 MG tablet Take 1  tablet by mouth 4 (four) times daily as needed for diarrhea or loose stools. 30 tablet 1  . DULoxetine (CYMBALTA) 60 MG capsule Take 1 capsule (60 mg total) by mouth daily. 30 capsule 0  . lidocaine-prilocaine (EMLA) cream Apply to affected area once 30 g 3  . losartan-hydrochlorothiazide (HYZAAR) 100-25 MG tablet Take 1 tablet by mouth daily.     . montelukast (SINGULAIR) 10 MG tablet Take 10 mg by mouth daily.     . Omega-3 Fatty Acids (FISH OIL  PO) Take 1 capsule by mouth daily.     Marland Kitchen omeprazole (PRILOSEC) 20 MG capsule Take 20 mg by mouth daily.    . ondansetron (ZOFRAN) 8 MG tablet Take 1 tablet (8 mg total) by mouth 2 (two) times daily as needed for refractory nausea / vomiting. 60 tablet 2  . oxyCODONE-acetaminophen (ROXICET) 5-325 MG tablet Take 1 tablet by mouth every 6 (six) hours as needed. 30 tablet 0  . Potassium Chloride ER 20 MEQ TBCR Take 1 tablet by mouth daily. 60 tablet 2  . prochlorperazine (COMPAZINE) 10 MG tablet Take 1 tablet (10 mg total) by mouth every 6 (six) hours as needed (Nausea or vomiting). 60 tablet 2  . promethazine (PHENERGAN) 25 MG tablet TAKE 1 TABLET BY MOUTH EVERY 6-8 HOURS AS NEEDED FOR NAUSEA  2  . triamcinolone ointment (KENALOG) 0.5 % Apply 1 application topically 2 (two) times daily. Rash on bilateral upper extremities 30 g 0   No current facility-administered medications for this visit.    Facility-Administered Medications Ordered in Other Visits  Medication Dose Route Frequency Provider Last Rate Last Dose  . 0.9 %  sodium chloride infusion   Intravenous Once Lloyd Huger, MD      . 0.9 %  sodium chloride infusion   Intravenous Once Lloyd Huger, MD      . 0.9 %  sodium chloride infusion   Intravenous Once Lloyd Huger, MD      . 0.9 %  sodium chloride infusion   Intravenous Continuous Lloyd Huger, MD 10 mL/hr at 01/25/17 [phone]    . CARBOplatin (PARAPLATIN) 460 mg in sodium chloride 0.9 % 250 mL chemo infusion  460 mg Intravenous Once Lloyd Huger, MD      . heparin lock flush 100 unit/mL  500 Units Intravenous Once Lloyd Huger, MD      . ondansetron [facility]) 8 mg in sodium chloride 0.9 % 50 mL IVPB   Intravenous Once Lloyd Huger, MD      . pegfilgrastim (NEULASTA ONPRO KIT) injection 6 mg  6 mg Subcutaneous Once Lloyd Huger, MD        OBJECTIVE: Vitals:   01/30/17 0916  BP: (!) 143/85  Pulse: 78  Resp: 18     Body mass index  is 30.76 kg/m.    ECOG FS:0 - Asymptomatic  General: Well-developed, well-nourished, no acute distress. Eyes: Pink conjunctiva, anicteric sclera. Breasts: Easily palpable right breast mass. Lungs: Clear to auscultation bilaterally. Heart: Regular rate and rhythm. No rubs, murmurs, or gallops. Abdomen: Soft, nontender, nondistended. No organomegaly noted, normoactive bowel sounds. Musculoskeletal: Non-pitting edema in BLE. No cyanosis, or clubbing. Neuro: Alert, answering all questions appropriately. Cranial nerves grossly intact. Skin: No rashes or petechiae noted. Psych: Normal affect.   LAB RESULTS:  Lab Results  Component Value Date   NA 138 01/30/2017   K 3.6 01/30/2017   CL 101 01/30/2017   CO2 26 01/30/2017  GLUCOSE 111 (H) 01/30/2017   BUN 26 (H) 01/30/2017   CREATININE 1.14 (H) 01/30/2017   CALCIUM 8.7 (L) 01/30/2017   PROT 6.6 01/30/2017   ALBUMIN 2.9 (L) 01/30/2017   AST 29 01/30/2017   ALT 23 01/30/2017   ALKPHOS 71 01/30/2017   BILITOT 0.3 01/30/2017   GFRNONAA 46 (L) 01/30/2017   GFRAA 53 (L) 01/30/2017    Lab Results  Component Value Date   WBC 13.6 (H) 01/30/2017   NEUTROABS 8.0 (H) 01/30/2017   HGB 9.7 (L) 01/30/2017   HCT 29.5 (L) 01/30/2017   MCV 98.0 01/30/2017   PLT 370 01/30/2017     STUDIES: Dg Chest 2 View  Result Date: 01/22/2017 CLINICAL DATA:  Chest pain and shortness of Breath EXAM: CHEST  2 VIEW COMPARISON:  10/24/2016 FINDINGS: Cardiac shadow is within normal limits. Left chest wall port is seen in satisfactory position. Emphysematous changes are noted in both lungs in the upper lobes but stable. No focal infiltrate or sizable effusion is seen. Mild degenerative changes of thoracic spine are noted. IMPRESSION: COPD without acute abnormality. The overall appearance is stable from the prior study. Electronically Signed   By: Inez Catalina M.D.   On: 01/22/2017 16:06   Ct Angio Chest Pe W And/or Wo Contrast  Result Date:  01/22/2017 CLINICAL DATA:  Right-sided breast cancer with chest pain and shortness of breath EXAM: CT ANGIOGRAPHY CHEST WITH CONTRAST TECHNIQUE: Multidetector CT imaging of the chest was performed using the standard protocol during bolus administration of intravenous contrast. Multiplanar CT image reconstructions and MIPs were obtained to evaluate the vascular anatomy. CONTRAST:  75 mL Isovue 370 intravenous COMPARISON:  Radiographs 01/22/2017, CT chest 04/08/2014, 04/09/2014 FINDINGS: Cardiovascular: Satisfactory opacification of the pulmonary arteries to the segmental level. No evidence of pulmonary embolism. Aortic atherosclerosis. No dissection is seen. Left-sided central venous catheter tip terminates in the proximal right atrium. Borderline to mild cardiomegaly. Coronary artery calcification. No pericardial effusion Mediastinum/Nodes: Midline trachea. No thyroid mass. Mild distension and debris filled esophagus. Nonspecific subcentimeter mediastinal lymph nodes. Subcentimeter right axillary lymph nodes. Moderate to large hiatal hernia. Lungs/Pleura: Moderate severe emphysema. No consolidation, pleural effusion or pneumothorax Upper Abdomen: Stable low-density lesion in the upper pole of right kidney, intermediate density values but no change in size since 2015, favoring benign process. No acute abnormality. Musculoskeletal: No definite acute or suspicious bone lesion is seen. Review of the MIP images confirms the above findings. IMPRESSION: 1. Negative for acute pulmonary embolus or aortic dissection 2. Moderate severe emphysema without consolidation or effusion 3. Moderate hiatal hernia with diffuse esophageal distension and debris 4. Stable 2.6 cm low-density lesion in the upper pole right kidney, favor benign process given lack of interval growth since 2015 Aortic Atherosclerosis (ICD10-I70.0) and Emphysema (ICD10-J43.9). Electronically Signed   By: Donavan Foil M.D.   On: 01/22/2017 19:46   Nm Cardiac  Muga Rest  Result Date: 01/22/2017 CLINICAL DATA:  Breast cancer, undergoing chemotherapy EXAM: NUCLEAR MEDICINE CARDIAC BLOOD POOL IMAGING (MUGA) TECHNIQUE: Cardiac multi-gated acquisition was performed at rest following intravenous injection of Tc-62mlabeled red blood cells. RADIOPHARMACEUTICALS:  21.5 mCi Tc-980mDP in-vitro labeled red blood cells IV COMPARISON:  10/20/2016 FINDINGS: The cine images demonstrate normal left ventricular wall motion. No akinetic or dyskinetic segments are identified. The ejection fraction was calculated at 68%. This is stable when compared to prior examination. IMPRESSION: Normal left ventricular wall motion with left ventricular ejection fraction of 68% (unchanged since prior study). Electronically Signed  By: Marijo Sanes M.D.   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  PLAN:    1. 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 Patient will require Neulasta support. MUGA scan from January 19, 2017 revealed an EF of 68% which is unchanged from previous. Continue with cycle 5 of 6 of Taxotere, carboplatinum, Herceptin, and Perjeta today. Previously Taxotere has been dose reduced 10%. Patient will also require a total of one year of Herceptin. At the conclusion of cycle 6 of chemotherapy, she will be referred to surgery for lumpectomy. Patient will require adjuvant XRT if she has a lumpectomy. Finally, given the PR positivity of her tumor she will benefit from an aromatase inhibitor for 5 years. Return to clinic in 3 weeks for consideration of cycle 6.  2. Nausea: Resolved. 3. Diarrhea: Continue Lomotil as needed. Resolved. 4.  Weakness and fatigue: Much Better.  5. Hypokalemia: Resolved. Continue oral potassium supplementation as prescribed. 6. Hypomagnesemia: Magnesium 1.3 today.Patient will receive 2 g IV magnesium today. Return to clinic on Friday for repeat laboratory work and fluids and then again on October 17th for labs and possible fluids. 7. Joint pain/shortness of breath: Resolved.  8. BLE Edema: Continue to monitor. Spoke with patient about wearing TED hose and to keep el;evated when sitting or laying. Likely from prednisone.    Patient expressed understanding and was in agreement with this plan. She also understands that She can call clinic at any time with any questions, concerns, or complaints.   Cancer Staging Malignant neoplasm of upper outer quadrant of female breast [facility]) Staging form: Breast, AJCC 8th Edition - Clinical stage from 10/16/2016: Stage IIB (cT2, cN1, cM0, G3, ER: Negative, PR: Positive, HER2: Positive) - Signed by Lloyd Huger, MD on 10/16/2016   Jacquelin Hawking, NP   01/30/2017 1:54 PM

## 2017-01-30 ENCOUNTER — Inpatient Hospital Stay: Payer: Medicare HMO

## 2017-01-30 ENCOUNTER — Inpatient Hospital Stay (HOSPITAL_BASED_OUTPATIENT_CLINIC_OR_DEPARTMENT_OTHER): Payer: Medicare HMO | Admitting: Oncology

## 2017-01-30 VITALS — BP 143/85 | HR 78 | Resp 18 | Wt 168.2 lb

## 2017-01-30 DIAGNOSIS — C50411 Malignant neoplasm of upper-outer quadrant of right female breast: Secondary | ICD-10-CM

## 2017-01-30 DIAGNOSIS — R197 Diarrhea, unspecified: Secondary | ICD-10-CM | POA: Diagnosis not present

## 2017-01-30 DIAGNOSIS — Z171 Estrogen receptor negative status [ER-]: Secondary | ICD-10-CM

## 2017-01-30 DIAGNOSIS — Z79899 Other long term (current) drug therapy: Secondary | ICD-10-CM | POA: Diagnosis not present

## 2017-01-30 DIAGNOSIS — M2550 Pain in unspecified joint: Secondary | ICD-10-CM

## 2017-01-30 DIAGNOSIS — Z5111 Encounter for antineoplastic chemotherapy: Secondary | ICD-10-CM | POA: Diagnosis not present

## 2017-01-30 DIAGNOSIS — R601 Generalized edema: Secondary | ICD-10-CM

## 2017-01-30 LAB — CBC WITH DIFFERENTIAL/PLATELET
BASOS ABS: 0.1 10*3/uL (ref 0–0.1)
BASOS PCT: 1 %
EOS ABS: 0.2 10*3/uL (ref 0–0.7)
Eosinophils Relative: 1 %
HCT: 29.5 % — ABNORMAL LOW (ref 35.0–47.0)
HEMOGLOBIN: 9.7 g/dL — AB (ref 12.0–16.0)
Lymphocytes Relative: 29 %
Lymphs Abs: 4 10*3/uL — ABNORMAL HIGH (ref 1.0–3.6)
MCH: 32.4 pg (ref 26.0–34.0)
MCHC: 33.1 g/dL (ref 32.0–36.0)
MCV: 98 fL (ref 80.0–100.0)
Monocytes Absolute: 1.3 10*3/uL — ABNORMAL HIGH (ref 0.2–0.9)
Monocytes Relative: 10 %
NEUTROS PCT: 59 %
Neutro Abs: 8 10*3/uL — ABNORMAL HIGH (ref 1.4–6.5)
Platelets: 370 10*3/uL (ref 150–440)
RBC: 3.01 MIL/uL — AB (ref 3.80–5.20)
RDW: 22.9 % — ABNORMAL HIGH (ref 11.5–14.5)
WBC: 13.6 10*3/uL — AB (ref 3.6–11.0)

## 2017-01-30 LAB — COMPREHENSIVE METABOLIC PANEL
ALBUMIN: 2.9 g/dL — AB (ref 3.5–5.0)
ALK PHOS: 71 U/L (ref 38–126)
ALT: 23 U/L (ref 14–54)
ANION GAP: 11 (ref 5–15)
AST: 29 U/L (ref 15–41)
BUN: 26 mg/dL — ABNORMAL HIGH (ref 6–20)
CALCIUM: 8.7 mg/dL — AB (ref 8.9–10.3)
CO2: 26 mmol/L (ref 22–32)
Chloride: 101 mmol/L (ref 101–111)
Creatinine, Ser: 1.14 mg/dL — ABNORMAL HIGH (ref 0.44–1.00)
GFR calc Af Amer: 53 mL/min — ABNORMAL LOW (ref >60)
GFR calc non Af Amer: 46 mL/min — ABNORMAL LOW (ref >60)
GLUCOSE: 111 mg/dL — AB (ref 65–99)
Potassium: 3.6 mmol/L (ref 3.5–5.1)
SODIUM: 138 mmol/L (ref 135–145)
Total Bilirubin: 0.3 mg/dL (ref 0.3–1.2)
Total Protein: 6.6 g/dL (ref 6.5–8.1)

## 2017-01-30 LAB — MAGNESIUM: Magnesium: 1.3 mg/dL — ABNORMAL LOW (ref 1.7–2.4)

## 2017-01-30 MED ORDER — HEPARIN SOD (PORK) LOCK FLUSH 100 UNIT/ML IV SOLN
500.0000 [IU] | Freq: Once | INTRAVENOUS | Status: AC
  Administered 2017-01-30: 500 [IU] via INTRAVENOUS
  Filled 2017-01-30: qty 5

## 2017-01-30 MED ORDER — MAGNESIUM SULFATE 2 GM/50ML IV SOLN
2.0000 g | Freq: Once | INTRAVENOUS | Status: AC
  Administered 2017-01-30: 2 g via INTRAVENOUS
  Filled 2017-01-30: qty 50

## 2017-01-30 MED ORDER — SODIUM CHLORIDE 0.9 % IV SOLN: 407.4000 mg | Freq: Once | INTRAVENOUS | Status: DC

## 2017-01-30 MED ORDER — SODIUM CHLORIDE 0.9 % IV SOLN
Freq: Once | INTRAVENOUS | Status: AC
  Administered 2017-01-30: 10:00:00 via INTRAVENOUS
  Filled 2017-01-30: qty 1000

## 2017-01-30 MED ORDER — CARBOPLATIN CHEMO INJECTION 600 MG/60ML
460.0000 mg | Freq: Once | INTRAVENOUS | Status: AC
  Administered 2017-01-30: 460 mg via INTRAVENOUS
  Filled 2017-01-30: qty 46

## 2017-01-30 MED ORDER — SODIUM CHLORIDE 0.9 % IV SOLN
420.0000 mg | Freq: Once | INTRAVENOUS | Status: AC
  Administered 2017-01-30: 420 mg via INTRAVENOUS
  Filled 2017-01-30: qty 14

## 2017-01-30 MED ORDER — MAGNESIUM SULFATE 50 % IJ SOLN: 2.0000 g | Freq: Once | INTRAMUSCULAR | Status: DC

## 2017-01-30 MED ORDER — DIPHENHYDRAMINE HCL 25 MG PO CAPS
25.0000 mg | ORAL_CAPSULE | Freq: Once | ORAL | Status: AC
  Administered 2017-01-30: 25 mg via ORAL
  Filled 2017-01-30: qty 1

## 2017-01-30 MED ORDER — TRASTUZUMAB CHEMO 150 MG IV SOLR
450.0000 mg | Freq: Once | INTRAVENOUS | Status: AC
  Administered 2017-01-30: 450 mg via INTRAVENOUS
  Filled 2017-01-30: qty 21.43

## 2017-01-30 MED ORDER — ACETAMINOPHEN 325 MG PO TABS
650.0000 mg | ORAL_TABLET | Freq: Once | ORAL | Status: AC
  Administered 2017-01-30: 650 mg via ORAL
  Filled 2017-01-30: qty 2

## 2017-01-30 MED ORDER — DEXAMETHASONE SODIUM PHOSPHATE 10 MG/ML IJ SOLN
10.0000 mg | Freq: Once | INTRAMUSCULAR | Status: AC
  Administered 2017-01-30: 10 mg via INTRAVENOUS
  Filled 2017-01-30: qty 1

## 2017-01-30 MED ORDER — DOCETAXEL CHEMO INJECTION 160 MG/16ML
67.0000 mg/m2 | Freq: Once | INTRAVENOUS | Status: AC
  Administered 2017-01-30: 120 mg via INTRAVENOUS
  Filled 2017-01-30: qty 12

## 2017-01-30 MED ORDER — PEGFILGRASTIM 6 MG/0.6ML ~~LOC~~ PSKT
6.0000 mg | PREFILLED_SYRINGE | Freq: Once | SUBCUTANEOUS | Status: AC
  Administered 2017-01-30: 6 mg via SUBCUTANEOUS
  Filled 2017-01-30: qty 0.6

## 2017-01-30 MED ORDER — PALONOSETRON HCL INJECTION 0.25 MG/5ML
0.2500 mg | Freq: Once | INTRAVENOUS | Status: AC
  Administered 2017-01-30: 0.25 mg via INTRAVENOUS
  Filled 2017-01-30: qty 5

## 2017-01-30 MED ORDER — SODIUM CHLORIDE 0.9 % IV SOLN: 443.4000 mg | Freq: Once | INTRAVENOUS | Status: DC

## 2017-01-30 NOTE — Progress Notes (Signed)
Pt in for follow up today and treatment.  Did not have treatment last week.  Reports improved breathing, no pain when takes deep breaths and denies any pain.  States "prednisone really helped me since last week".  Pt did complete her prednisone pack yesterday.

## 2017-02-02 ENCOUNTER — Inpatient Hospital Stay: Payer: Medicare HMO

## 2017-02-02 ENCOUNTER — Other Ambulatory Visit: Payer: Self-pay | Admitting: Oncology

## 2017-02-02 DIAGNOSIS — C50411 Malignant neoplasm of upper-outer quadrant of right female breast: Secondary | ICD-10-CM

## 2017-02-02 DIAGNOSIS — C50919 Malignant neoplasm of unspecified site of unspecified female breast: Secondary | ICD-10-CM

## 2017-02-02 DIAGNOSIS — Z5111 Encounter for antineoplastic chemotherapy: Secondary | ICD-10-CM | POA: Diagnosis not present

## 2017-02-02 DIAGNOSIS — Z170 Estrogen receptor positive status [ER+]: Principal | ICD-10-CM

## 2017-02-02 LAB — CBC WITH DIFFERENTIAL/PLATELET
BASOS ABS: 0.1 10*3/uL (ref 0–0.1)
BASOS PCT: 0 %
EOS ABS: 0 10*3/uL (ref 0–0.7)
Eosinophils Relative: 0 %
HCT: 30.7 % — ABNORMAL LOW (ref 35.0–47.0)
HEMOGLOBIN: 9.8 g/dL — AB (ref 12.0–16.0)
Lymphocytes Relative: 5 %
Lymphs Abs: 2 10*3/uL (ref 1.0–3.6)
MCH: 31.5 pg (ref 26.0–34.0)
MCHC: 32 g/dL (ref 32.0–36.0)
MCV: 98.4 fL (ref 80.0–100.0)
MONOS PCT: 1 %
Monocytes Absolute: 0.4 10*3/uL (ref 0.2–0.9)
NEUTROS PCT: 94 %
Neutro Abs: 39.1 10*3/uL — ABNORMAL HIGH (ref 1.4–6.5)
Platelets: 230 10*3/uL (ref 150–440)
RBC: 3.12 MIL/uL — ABNORMAL LOW (ref 3.80–5.20)
RDW: 21.8 % — AB (ref 11.5–14.5)
WBC: 41.7 10*3/uL — ABNORMAL HIGH (ref 3.6–11.0)

## 2017-02-02 LAB — COMPREHENSIVE METABOLIC PANEL
ALBUMIN: 2.9 g/dL — AB (ref 3.5–5.0)
ALT: 22 U/L (ref 14–54)
ANION GAP: 8 (ref 5–15)
AST: 26 U/L (ref 15–41)
Alkaline Phosphatase: 94 U/L (ref 38–126)
BUN: 21 mg/dL — AB (ref 6–20)
CHLORIDE: 96 mmol/L — AB (ref 101–111)
CO2: 28 mmol/L (ref 22–32)
Calcium: 8.5 mg/dL — ABNORMAL LOW (ref 8.9–10.3)
Creatinine, Ser: 1.01 mg/dL — ABNORMAL HIGH (ref 0.44–1.00)
GFR calc Af Amer: >60 mL/min (ref >60)
GFR calc non Af Amer: 53 mL/min — ABNORMAL LOW (ref >60)
GLUCOSE: 142 mg/dL — AB (ref 65–99)
POTASSIUM: 3.7 mmol/L (ref 3.5–5.1)
SODIUM: 132 mmol/L — AB (ref 135–145)
Total Bilirubin: 0.8 mg/dL (ref 0.3–1.2)
Total Protein: 6.5 g/dL (ref 6.5–8.1)

## 2017-02-02 LAB — MAGNESIUM: Magnesium: 1.3 mg/dL — ABNORMAL LOW (ref 1.7–2.4)

## 2017-02-02 MED ORDER — MAGNESIUM SULFATE 4 GM/100ML IV SOLN
4.0000 g | Freq: Once | INTRAVENOUS | Status: AC
  Administered 2017-02-02: 4 g via INTRAVENOUS
  Filled 2017-02-02: qty 100

## 2017-02-02 MED ORDER — SODIUM CHLORIDE 0.9 % IV SOLN
INTRAVENOUS | Status: DC
  Filled 2017-02-02: qty 1000

## 2017-02-02 MED ORDER — MAGNESIUM SULFATE 4 GM/100ML IV SOLN: 4.0000 g | Freq: Once | INTRAVENOUS | Status: DC

## 2017-02-02 MED ORDER — SODIUM CHLORIDE 0.9 % IV SOLN: 4.0000 g | Freq: Once | INTRAVENOUS | Status: DC

## 2017-02-02 MED ORDER — SODIUM CHLORIDE 0.9 % IV SOLN
Freq: Once | INTRAVENOUS | Status: AC
  Administered 2017-02-02: 13:00:00 via INTRAVENOUS
  Filled 2017-02-02: qty 1000

## 2017-02-06 ENCOUNTER — Other Ambulatory Visit: Payer: Self-pay | Admitting: *Deleted

## 2017-02-06 MED ORDER — DIPHENOXYLATE-ATROPINE 2.5-0.025 MG PO TABS: 1.0000 | ORAL_TABLET | Freq: Four times a day (QID) | ORAL | 1 refills | Status: DC | PRN

## 2017-02-07 ENCOUNTER — Inpatient Hospital Stay
Admission: AD | Admit: 2017-02-07 | Discharge: 2017-02-10 | DRG: 683 | Disposition: A | Discharge status: Home Health | Payer: Medicare HMO | Source: Ambulatory Visit | Attending: Internal Medicine | Admitting: Internal Medicine

## 2017-02-07 ENCOUNTER — Inpatient Hospital Stay: Payer: Medicare HMO

## 2017-02-07 ENCOUNTER — Inpatient Hospital Stay (HOSPITAL_BASED_OUTPATIENT_CLINIC_OR_DEPARTMENT_OTHER): Payer: Medicare HMO | Admitting: Oncology

## 2017-02-07 DIAGNOSIS — R197 Diarrhea, unspecified: Secondary | ICD-10-CM

## 2017-02-07 DIAGNOSIS — Z95828 Presence of other vascular implants and grafts: Secondary | ICD-10-CM | POA: Diagnosis not present

## 2017-02-07 DIAGNOSIS — E876 Hypokalemia: Secondary | ICD-10-CM | POA: Diagnosis present

## 2017-02-07 DIAGNOSIS — K449 Diaphragmatic hernia without obstruction or gangrene: Secondary | ICD-10-CM | POA: Diagnosis not present

## 2017-02-07 DIAGNOSIS — K219 Gastro-esophageal reflux disease without esophagitis: Secondary | ICD-10-CM | POA: Diagnosis not present

## 2017-02-07 DIAGNOSIS — T502X5A Adverse effect of carbonic-anhydrase inhibitors, benzothiadiazides and other diuretics, initial encounter: Secondary | ICD-10-CM | POA: Diagnosis present

## 2017-02-07 DIAGNOSIS — K521 Toxic gastroenteritis and colitis: Secondary | ICD-10-CM | POA: Diagnosis present

## 2017-02-07 DIAGNOSIS — R601 Generalized edema: Secondary | ICD-10-CM

## 2017-02-07 DIAGNOSIS — R0602 Shortness of breath: Secondary | ICD-10-CM | POA: Diagnosis not present

## 2017-02-07 DIAGNOSIS — T465X5A Adverse effect of other antihypertensive drugs, initial encounter: Secondary | ICD-10-CM | POA: Diagnosis present

## 2017-02-07 DIAGNOSIS — R110 Nausea: Secondary | ICD-10-CM

## 2017-02-07 DIAGNOSIS — Z79899 Other long term (current) drug therapy: Secondary | ICD-10-CM | POA: Diagnosis not present

## 2017-02-07 DIAGNOSIS — M1990 Unspecified osteoarthritis, unspecified site: Secondary | ICD-10-CM | POA: Diagnosis present

## 2017-02-07 DIAGNOSIS — Z171 Estrogen receptor negative status [ER-]: Secondary | ICD-10-CM

## 2017-02-07 DIAGNOSIS — C50411 Malignant neoplasm of upper-outer quadrant of right female breast: Secondary | ICD-10-CM

## 2017-02-07 DIAGNOSIS — E871 Hypo-osmolality and hyponatremia: Secondary | ICD-10-CM | POA: Diagnosis present

## 2017-02-07 DIAGNOSIS — M2550 Pain in unspecified joint: Secondary | ICD-10-CM | POA: Diagnosis not present

## 2017-02-07 DIAGNOSIS — R531 Weakness: Secondary | ICD-10-CM

## 2017-02-07 DIAGNOSIS — I959 Hypotension, unspecified: Secondary | ICD-10-CM | POA: Diagnosis present

## 2017-02-07 DIAGNOSIS — Z7951 Long term (current) use of inhaled steroids: Secondary | ICD-10-CM | POA: Diagnosis not present

## 2017-02-07 DIAGNOSIS — T451X5A Adverse effect of antineoplastic and immunosuppressive drugs, initial encounter: Secondary | ICD-10-CM | POA: Diagnosis present

## 2017-02-07 DIAGNOSIS — D6481 Anemia due to antineoplastic chemotherapy: Secondary | ICD-10-CM | POA: Diagnosis present

## 2017-02-07 DIAGNOSIS — F419 Anxiety disorder, unspecified: Secondary | ICD-10-CM | POA: Diagnosis present

## 2017-02-07 DIAGNOSIS — Z888 Allergy status to other drugs, medicaments and biological substances status: Secondary | ICD-10-CM

## 2017-02-07 DIAGNOSIS — E860 Dehydration: Secondary | ICD-10-CM

## 2017-02-07 DIAGNOSIS — N179 Acute kidney failure, unspecified: Secondary | ICD-10-CM | POA: Diagnosis present

## 2017-02-07 DIAGNOSIS — J449 Chronic obstructive pulmonary disease, unspecified: Secondary | ICD-10-CM | POA: Diagnosis not present

## 2017-02-07 DIAGNOSIS — I10 Essential (primary) hypertension: Secondary | ICD-10-CM | POA: Diagnosis present

## 2017-02-07 DIAGNOSIS — Z1501 Genetic susceptibility to malignant neoplasm of breast: Secondary | ICD-10-CM | POA: Diagnosis not present

## 2017-02-07 DIAGNOSIS — I700 Atherosclerosis of aorta: Secondary | ICD-10-CM | POA: Diagnosis not present

## 2017-02-07 DIAGNOSIS — Z87891 Personal history of nicotine dependence: Secondary | ICD-10-CM | POA: Diagnosis not present

## 2017-02-07 DIAGNOSIS — Z5111 Encounter for antineoplastic chemotherapy: Secondary | ICD-10-CM | POA: Diagnosis present

## 2017-02-07 DIAGNOSIS — N189 Chronic kidney disease, unspecified: Secondary | ICD-10-CM | POA: Diagnosis present

## 2017-02-07 DIAGNOSIS — Z7689 Persons encountering health services in other specified circumstances: Secondary | ICD-10-CM | POA: Diagnosis not present

## 2017-02-07 DIAGNOSIS — D638 Anemia in other chronic diseases classified elsewhere: Secondary | ICD-10-CM | POA: Diagnosis present

## 2017-02-07 LAB — COMPREHENSIVE METABOLIC PANEL
ALT: 16 U/L (ref 14–54)
AST: 21 U/L (ref 15–41)
Albumin: 2.9 g/dL — ABNORMAL LOW (ref 3.5–5.0)
Alkaline Phosphatase: 85 U/L (ref 38–126)
Anion gap: 15 (ref 5–15)
BILIRUBIN TOTAL: 0.7 mg/dL (ref 0.3–1.2)
BUN: 60 mg/dL — AB (ref 6–20)
CALCIUM: 8.5 mg/dL — AB (ref 8.9–10.3)
CO2: 21 mmol/L — ABNORMAL LOW (ref 22–32)
CREATININE: 3.15 mg/dL — AB (ref 0.44–1.00)
Chloride: 93 mmol/L — ABNORMAL LOW (ref 101–111)
GFR, EST AFRICAN AMERICAN: 16 mL/min — AB (ref >60)
GFR, EST NON AFRICAN AMERICAN: 13 mL/min — AB (ref >60)
Glucose, Bld: 184 mg/dL — ABNORMAL HIGH (ref 65–99)
Potassium: 3 mmol/L — ABNORMAL LOW (ref 3.5–5.1)
Sodium: 129 mmol/L — ABNORMAL LOW (ref 135–145)
TOTAL PROTEIN: 6.7 g/dL (ref 6.5–8.1)

## 2017-02-07 LAB — CBC WITH DIFFERENTIAL/PLATELET
BASOS ABS: 0 10*3/uL (ref 0–0.1)
Basophils Relative: 1 %
Eosinophils Absolute: 0 10*3/uL (ref 0–0.7)
Eosinophils Relative: 0 %
HEMATOCRIT: 28.7 % — AB (ref 35.0–47.0)
Hemoglobin: 9.5 g/dL — ABNORMAL LOW (ref 12.0–16.0)
LYMPHS ABS: 2.2 10*3/uL (ref 1.0–3.6)
LYMPHS PCT: 43 %
MCH: 32.8 pg (ref 26.0–34.0)
MCHC: 33.2 g/dL (ref 32.0–36.0)
MCV: 98.6 fL (ref 80.0–100.0)
MONO ABS: 0.5 10*3/uL (ref 0.2–0.9)
Monocytes Relative: 10 %
NEUTROS ABS: 2.4 10*3/uL (ref 1.4–6.5)
Neutrophils Relative %: 46 %
Platelets: 122 10*3/uL — ABNORMAL LOW (ref 150–440)
RBC: 2.91 MIL/uL — AB (ref 3.80–5.20)
RDW: 19.3 % — ABNORMAL HIGH (ref 11.5–14.5)
WBC: 5.1 10*3/uL (ref 3.6–11.0)

## 2017-02-07 LAB — MAGNESIUM: Magnesium: 1.8 mg/dL (ref 1.7–2.4)

## 2017-02-07 MED ORDER — HEPARIN SODIUM (PORCINE) 5000 UNIT/ML IJ SOLN
5000.0000 [IU] | Freq: Three times a day (TID) | INTRAMUSCULAR | Status: DC
  Administered 2017-02-07 – 2017-02-08 (×2): 5000 [IU] via SUBCUTANEOUS
  Filled 2017-02-07 (×2): qty 1

## 2017-02-07 MED ORDER — SODIUM CHLORIDE 0.9 % IV SOLN
INTRAVENOUS | Status: DC
  Administered 2017-02-07: 14:00:00 via INTRAVENOUS
  Filled 2017-02-07: qty 1000

## 2017-02-07 MED ORDER — SODIUM CHLORIDE 0.9% FLUSH
10.0000 mL | Freq: Once | INTRAVENOUS | Status: AC
  Administered 2017-02-07: 10 mL via INTRAVENOUS
  Filled 2017-02-07: qty 10

## 2017-02-07 MED ORDER — ACETAMINOPHEN 325 MG PO TABS: 650.0000 mg | ORAL_TABLET | Freq: Four times a day (QID) | ORAL | Status: DC | PRN

## 2017-02-07 MED ORDER — SODIUM CHLORIDE 0.9% FLUSH: 10.0000 mL | INTRAVENOUS | Status: DC | PRN

## 2017-02-07 MED ORDER — CALCIUM CARBONATE ANTACID 500 MG PO CHEW
1.0000 | CHEWABLE_TABLET | ORAL | Status: DC | PRN
  Administered 2017-02-07 (×2): 200 mg via ORAL
  Filled 2017-02-07 (×2): qty 1

## 2017-02-07 MED ORDER — HEPARIN SOD (PORK) LOCK FLUSH 100 UNIT/ML IV SOLN: 500.0000 [IU] | Freq: Once | INTRAVENOUS | Status: DC

## 2017-02-07 MED ORDER — MAGNESIUM SULFATE 2 GM/50ML IV SOLN
2.0000 g | Freq: Once | INTRAVENOUS | Status: AC
  Administered 2017-02-07: 18:00:00 2 g via INTRAVENOUS
  Filled 2017-02-07: qty 50

## 2017-02-07 MED ORDER — PANTOPRAZOLE SODIUM 40 MG PO TBEC
40.0000 mg | DELAYED_RELEASE_TABLET | Freq: Every day | ORAL | Status: DC
  Administered 2017-02-07 – 2017-02-10 (×4): 40 mg via ORAL
  Filled 2017-02-07 (×4): qty 1

## 2017-02-07 MED ORDER — BACLOFEN 10 MG PO TABS
10.0000 mg | ORAL_TABLET | Freq: Every day | ORAL | Status: DC
  Administered 2017-02-07 – 2017-02-09 (×3): 10 mg via ORAL
  Filled 2017-02-07 (×4): qty 1

## 2017-02-07 MED ORDER — SODIUM CHLORIDE 0.9 % IV SOLN
Freq: Once | INTRAVENOUS | Status: AC
  Administered 2017-02-07: 13:00:00 via INTRAVENOUS
  Filled 2017-02-07: qty 1000

## 2017-02-07 MED ORDER — DULOXETINE HCL 30 MG PO CPEP: 60.0000 mg | ORAL_CAPSULE | Freq: Every day | ORAL | Status: DC

## 2017-02-07 MED ORDER — MOMETASONE FURO-FORMOTEROL FUM 200-5 MCG/ACT IN AERO
2.0000 | INHALATION_SPRAY | Freq: Two times a day (BID) | RESPIRATORY_TRACT | Status: DC
  Administered 2017-02-07 – 2017-02-10 (×6): 2 via RESPIRATORY_TRACT
  Filled 2017-02-07: qty 8.8

## 2017-02-07 MED ORDER — ALPRAZOLAM 0.5 MG PO TABS
0.2500 mg | ORAL_TABLET | Freq: Every evening | ORAL | Status: DC | PRN
  Administered 2017-02-09: 0.25 mg via ORAL
  Filled 2017-02-07: qty 1

## 2017-02-07 MED ORDER — ACETAMINOPHEN 650 MG RE SUPP: 650.0000 mg | Freq: Four times a day (QID) | RECTAL | Status: DC | PRN

## 2017-02-07 MED ORDER — ONDANSETRON HCL 4 MG PO TABS: 4.0000 mg | ORAL_TABLET | Freq: Four times a day (QID) | ORAL | Status: DC | PRN

## 2017-02-07 MED ORDER — HYDROCODONE-ACETAMINOPHEN 5-325 MG PO TABS: 1.0000 | ORAL_TABLET | ORAL | Status: DC | PRN

## 2017-02-07 MED ORDER — ONDANSETRON HCL 4 MG/2ML IJ SOLN: 4.0000 mg | Freq: Four times a day (QID) | INTRAMUSCULAR | Status: DC | PRN

## 2017-02-07 MED ORDER — POTASSIUM CHLORIDE 20 MEQ PO PACK
40.0000 meq | PACK | Freq: Once | ORAL | Status: AC
  Administered 2017-02-07: 40 meq via ORAL
  Filled 2017-02-07: qty 2

## 2017-02-07 MED ORDER — POTASSIUM CHLORIDE IN NACL 20-0.9 MEQ/L-% IV SOLN
INTRAVENOUS | Status: DC
  Administered 2017-02-07 – 2017-02-08 (×3): via INTRAVENOUS
  Filled 2017-02-07 (×4): qty 1000

## 2017-02-07 MED ORDER — SENNOSIDES-DOCUSATE SODIUM 8.6-50 MG PO TABS: 1.0000 | ORAL_TABLET | Freq: Every evening | ORAL | Status: DC | PRN

## 2017-02-07 NOTE — Progress Notes (Signed)
Pt here today for labs/IVF. Pt reports diarrhea for four days and being unable to tolerate foods and liquids. Very weak. Lab work reviewed by Dr Grayland Ormond. Pt being admitted to room 109. 1L NS bolus given. Pt to be transported by hospital orderly. Family at chairside

## 2017-02-07 NOTE — Progress Notes (Signed)
PHARMACY CONSULT NOTE - INITIAL   Pharmacy Consult for Electrolyte monitoring and replacement.  Patient Measurements: Height: 5\' 3"  (160 cm) Weight: 163 lb (73.9 kg) IBW/kg (Calculated) : 52.4  Vital Signs: Temp: 98.6 F (37 C) (10/17 1538) Temp Source: Oral (10/17 1538) BP: 113/45 (10/17 1538) Pulse Rate: 78 (10/17 1538) Intake/Output from previous day: No intake/output data recorded. Intake/Output from this shift: No intake/output data recorded.   Recent Labs  02/07/17 1204  WBC 5.1  HGB 9.5*  HCT 28.7*  PLT 122*  CREATININE 3.15*  MG 1.8  ALBUMIN 2.9*  PROT 6.7  AST 21  ALT 16  ALKPHOS 85  BILITOT 0.7   Estimated Creatinine Clearance: 14.9 mL/min (A) (by C-G formula based on SCr of 3.15 mg/dL (H)).   Assessment: 76 yo female admitted with AKI and dehydration. Pharmacy consulted for electrolyte monitoring and replacement.   10/17: K: 3.0    Mg: 1.8  Goal of Therapy:  K: 3.5-5.1 mmol/L Mg: 1.9-2.4 mg/dL  Plan:  Patient is receiving NS w/KCL73mEq/L @ 168mL/hr. Will replace K+ with KCL 38mEq x 1 dose and Mg with Magnesium sulfate 2g IV x 1 dose.   Will recheck electrolytes with AM labs and continue to replace as needed.    Pernell Dupre, PharmD, BCPS Clinical Pharmacist 02/07/2017 5:15 PM

## 2017-02-07 NOTE — Progress Notes (Signed)
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  INTERVAL HISTORY: Patient returns to clinic today as an add-on with significant dehydration and acute renal failure. Patient states she has been nauseous and not taken much PO for the past 3 or 4 days. She last received chemotherapy approximately 9 days ago. She has no neurologic complaints. She denies any fevers. She has no chest pain, cough, or shortness of breath. She denies any constipation or diarrhea. She has no urinary complaints. Patient feels generally terrible, but offers no further specific complaints.  REVIEW OF SYSTEMS:   Review of Systems  Constitutional: Positive for malaise/fatigue. Negative for fever and weight loss.  Respiratory: Negative for cough and shortness of breath.   Cardiovascular: Negative.  Negative for chest pain and leg swelling.  Gastrointestinal: Positive for nausea. Negative for abdominal pain and diarrhea.  Genitourinary: Negative.   Musculoskeletal: Negative for joint pain.  Skin: Negative.  Negative for rash.  Neurological: Positive for weakness. Negative for sensory change.  Psychiatric/Behavioral: Negative.  The patient is not nervous/anxious.     As per HPI. Otherwise, a complete review of systems is negative.  PAST MEDICAL HISTORY: Past Medical History:  Diagnosis Date  . Anxiety   . Arthritis   . Cancer [facility])    Right Breast Cancer  . COPD (chronic obstructive pulmonary disease) (HCC)   . Dyspnea    with exertion  . GERD (gastroesophageal reflux disease)   . Hypertension     PAST SURGICAL HISTORY: Past Surgical History:  Procedure Laterality Date  . ABDOMINAL HYSTERECTOMY  1990     Partial  . BREAST BIOPSY Right 10/04/2016   axilla lymph node and axillay tail mass biopsy. Path pending  . DILATION AND CURETTAGE OF UTERUS    . PORTACATH PLACEMENT Left 10/24/2016   Procedure: INSERTION PORT-A-CATH;  Surgeon: Hulda Marin, MD;  Location: [facility] ORS;  Service: General;  Laterality: Left;    FAMILY HISTORY: Family History  Problem Relation Age of Onset  . Colon cancer Mother   . Breast cancer Neg Hx     ADVANCED DIRECTIVES (Y/N):  N  HEALTH MAINTENANCE: Social History  Substance Use Topics  . Smoking status: Former Smoker    Packs/day: 0.50    Types: Cigarettes    Quit date: 07/23/1988  . Smokeless tobacco: Never Used  . Alcohol use No     Colonoscopy:  PAP:  Bone density:  Lipid panel:  Allergies  Allergen Reactions  . Atorvastatin Other (See Comments)    Muscle Pain  . Gabapentin Swelling  . Penicillins Swelling    No current facility-administered medications for this visit.    No current outpatient prescriptions on file.   Facility-Administered Medications Ordered in Other Visits  Medication Dose Route Frequency Provider Last Rate Last Dose  . 0.9 %  sodium chloride infusion   Intravenous Once Jeralyn Ruths, MD      . 0.9 %  sodium chloride infusion   Intravenous Once Jeralyn Ruths, MD      . 0.9 %  sodium chloride infusion   Intravenous Once Jeralyn Ruths, MD      . 0.9 %  sodium chloride infusion   Intravenous Continuous Orlie Dakin, Tollie Pizza,  MD      . ondansetron (ZOFRAN) 8 mg in sodium chloride 0.9 % 50 mL IVPB   Intravenous Once Lloyd Huger, MD        OBJECTIVE: There were no vitals filed for this visit.   There is no height or weight on file to calculate BMI.    ECOG FS:0 - Asymptomatic  General: Well-developed, well-nourished, no acute distress. Eyes: Pink conjunctiva, anicteric sclera. Breasts: Easily palpable right breast mass. Lungs: Clear to auscultation bilaterally. Heart: Regular rate and rhythm. No  rubs, murmurs, or gallops. Abdomen: Soft, nontender, nondistended. No organomegaly noted, normoactive bowel sounds. Musculoskeletal: Non-pitting edema in BLE. No cyanosis, or clubbing. Neuro: Alert, answering all questions appropriately. Cranial nerves grossly intact. Skin: No rashes or petechiae noted. Psych: Normal affect.   LAB RESULTS:  Lab Results  Component Value Date   NA 129 (L) 02/07/2017   K 3.0 (L) 02/07/2017   CL 93 (L) 02/07/2017   CO2 21 (L) 02/07/2017   GLUCOSE 184 (H) 02/07/2017   BUN 60 (H) 02/07/2017   CREATININE 3.15 (H) 02/07/2017   CALCIUM 8.5 (L) 02/07/2017   PROT 6.7 02/07/2017   ALBUMIN 2.9 (L) 02/07/2017   AST 21 02/07/2017   ALT 16 02/07/2017   ALKPHOS 85 02/07/2017   BILITOT 0.7 02/07/2017   GFRNONAA 13 (L) 02/07/2017   GFRAA 16 (L) 02/07/2017    Lab Results  Component Value Date   WBC 5.1 02/07/2017   NEUTROABS 2.4 02/07/2017   HGB 9.5 (L) 02/07/2017   HCT 28.7 (L) 02/07/2017   MCV 98.6 02/07/2017   PLT 122 (L) 02/07/2017     STUDIES: Dg Chest 2 View  Result Date: 01/22/2017 CLINICAL DATA:  Chest pain and shortness of Breath EXAM: CHEST  2 VIEW COMPARISON:  10/24/2016 FINDINGS: Cardiac shadow is within normal limits. Left chest wall port is seen in satisfactory position. Emphysematous changes are noted in both lungs in the upper lobes but stable. No focal infiltrate or sizable effusion is seen. Mild degenerative changes of thoracic spine are noted. IMPRESSION: COPD without acute abnormality. The overall appearance is stable from the prior study. Electronically Signed   By: Inez Catalina M.D.   On: 01/22/2017 16:06   Ct Angio Chest Pe W And/or Wo Contrast  Result Date: 01/22/2017 CLINICAL DATA:  Right-sided breast cancer with chest pain and shortness of breath EXAM: CT ANGIOGRAPHY CHEST WITH CONTRAST TECHNIQUE: Multidetector CT imaging of the chest was performed using the standard protocol during bolus administration of intravenous contrast.  Multiplanar CT image reconstructions and MIPs were obtained to evaluate the vascular anatomy. CONTRAST:  75 mL Isovue 370 intravenous COMPARISON:  Radiographs 01/22/2017, CT chest 04/08/2014, 04/09/2014 FINDINGS: Cardiovascular: Satisfactory opacification of the pulmonary arteries to the segmental level. No evidence of pulmonary embolism. Aortic atherosclerosis. No dissection is seen. Left-sided central venous catheter tip terminates in the proximal right atrium. Borderline to mild cardiomegaly. Coronary artery calcification. No pericardial effusion Mediastinum/Nodes: Midline trachea. No thyroid mass. Mild distension and debris filled esophagus. Nonspecific subcentimeter mediastinal lymph nodes. Subcentimeter right axillary lymph nodes. Moderate to large hiatal hernia. Lungs/Pleura: Moderate severe emphysema. No consolidation, pleural effusion or pneumothorax Upper Abdomen: Stable low-density lesion in the upper pole of right kidney, intermediate density values but no change in size since 2015, favoring benign process. No acute abnormality. Musculoskeletal: No definite acute or suspicious bone lesion is seen. Review of the MIP images confirms the above findings. IMPRESSION: 1. Negative for acute pulmonary embolus or aortic  dissection 2. Moderate severe emphysema without consolidation or effusion 3. Moderate hiatal hernia with diffuse esophageal distension and debris 4. Stable 2.6 cm low-density lesion in the upper pole right kidney, favor benign process given lack of interval growth since 2015 Aortic Atherosclerosis (ICD10-I70.0) and Emphysema (ICD10-J43.9). Electronically Signed   By: Donavan Foil M.D.   On: 01/22/2017 19:46   Nm Cardiac Muga Rest  Result Date: 01/22/2017 CLINICAL DATA:  Breast cancer, undergoing chemotherapy EXAM: NUCLEAR MEDICINE CARDIAC BLOOD POOL IMAGING (MUGA) TECHNIQUE: Cardiac multi-gated acquisition was performed at rest following intravenous injection of Tc-76mlabeled red blood  cells. RADIOPHARMACEUTICALS:  21.5 mCi Tc-949mDP in-vitro labeled red blood cells IV COMPARISON:  10/20/2016 FINDINGS: The cine images demonstrate normal left ventricular wall motion. No akinetic or dyskinetic segments are identified. The ejection fraction was calculated at 68%. This is stable when compared to prior examination. IMPRESSION: Normal left ventricular wall motion with left ventricular ejection fraction of 68% (unchanged since prior study). Electronically Signed   By: P.Marijo Sanes.D.   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  PLAN:    1. 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 Patient will require Neulasta support. MUGA scan from January 19, 2017 revealed an EF of 68% which is unchanged from previous. Patient received cycle 5 of treatment on January 30, 2017. Given her difficulties with treatment, will discontinue chemotherapy altogether. Patient has been instructed to keep her previously scheduled follow-up appointment on February 20, 2017 for further evaluation and Herceptin only. At that time, patient will require a referal to surgery for lumpectomy. Patient will require adjuvant XRT if she has a lumpectomy. Finally, given the PR positivity of her tumor she will benefit from an aromatase inhibitor for 5 years.  2. Acute renal failure: Secondary dehydration. Admit to the hospital today for IV fluids and symptomatic treatment. 3. Nausea: Continue current antiemetics as ordered. 4. Hypokalemia: Patient may require IV replacement in the hospital. Continue oral potassium supplementation as prescribed. 6. Hypomagnesemia: Magnesium 1.8  today.  Patient expressed understanding and was in agreement with this plan. She also understands that She can call clinic at any time with any questions, concerns, or complaints.   Cancer Staging Malignant neoplasm of upper outer quadrant of female breast ([facility] form: Breast, AJCC 8th Edition - Clinical stage from 10/16/2016: Stage IIB (cT2, cN1, cM0, G3, ER: Negative, PR: Positive, HER2: Positive) - Signed by FiLloyd HugerMD on 10/16/2016   TiLloyd HugerMD   02/07/2017 3:51 PM

## 2017-02-07 NOTE — H&P (Signed)
[facility] - [place] [facility]   PATIENT NAME: Krista Cisneros    MR#:  [id number]  DATE OF BIRTH:  Feb 19, 1941  DATE OF ADMISSION:  (Not on file)  PRIMARY CARE PHYSICIAN: Rayetta Humphrey, MD   REQUESTING/REFERRING PHYSICIAN:  Dr Orlie Dakin  CHIEF COMPLAINT:   weakness HISTORY OF PRESENT ILLNESS:  Krista Cisneros  is a 76 y.o. 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 She was seen in the office a few days ago for dehydration after chemo and was treated with IVF and 2g of Mg. However, she continued to have weakness, diarrhea (from chemo) and poor po intake. She was seen today in Clinic and found to have AKI, hypokalemia and hyponatremia. Her diarrhea has improed and she was actually able to tolerate her breakfast this am  PAST MEDICAL HISTORY:   Past Medical History:  Diagnosis Date  . Anxiety   . Arthritis   . Cancer [facility])    Right Breast Cancer  . COPD (chronic obstructive pulmonary disease) (HCC)   . Dyspnea    with exertion  . GERD (gastroesophageal reflux disease)   . Hypertension     PAST SURGICAL HISTORY:   Past Surgical History:  Procedure Laterality Date  . ABDOMINAL HYSTERECTOMY  1990   Partial  . BREAST BIOPSY Right 10/04/2016   axilla lymph node and axillay tail mass biopsy. Path pending  . DILATION AND CURETTAGE OF UTERUS    . PORTACATH PLACEMENT Left 10/24/2016   Procedure: INSERTION PORT-A-CATH;  Surgeon: Hulda Marin, MD;  Location: [facility] ORS;  Service: General;  Laterality: Left;    SOCIAL HISTORY:   Social History  Substance Use Topics  . Smoking status: Former Smoker    Packs/day: 0.50    Types: Cigarettes    Quit date: 07/23/1988  . Smokeless tobacco: Never Used  . Alcohol use No    FAMILY HISTORY:   Family History   Problem Relation Age of Onset  . Colon cancer Mother   . Breast cancer Neg Hx     DRUG ALLERGIES:   Allergies  Allergen Reactions  . Atorvastatin Other (See Comments)    Muscle Pain  . Gabapentin Swelling  . Penicillins Swelling    REVIEW OF SYSTEMS:   Review of Systems  Constitutional: Positive for malaise/fatigue. Negative for chills and fever.  HENT: Negative.  Negative for ear discharge, ear pain, hearing loss, nosebleeds and sore throat.   Eyes: Negative.  Negative for blurred vision and pain.  Respiratory: Negative.  Negative for cough, hemoptysis, shortness of breath and wheezing.   Cardiovascular: Negative.  Negative for chest pain, palpitations and leg swelling.  Gastrointestinal: Positive for diarrhea (better). Negative for abdominal pain, blood in stool, nausea and vomiting.  Genitourinary: Negative.  Negative for dysuria.  Musculoskeletal: Negative.  Negative for back pain.  Skin: Negative.   Neurological: Positive for weakness. Negative for dizziness, tremors, speech change, focal weakness, seizures and headaches.  Endo/Heme/Allergies: Negative.  Does not bruise/bleed easily.  Psychiatric/Behavioral: Negative.  Negative for depression, hallucinations and suicidal ideas.    MEDICATIONS AT HOME:   Prior to Admission medications   Medication Sig Start Date End Date Taking? Authorizing Provider  ADVAIR DISKUS 500-50 MCG/DOSE AEPB Inhale 1 puff into the lungs 2 (two) times daily.  05/04/16   [provider]  ALPRAZolam (XANAX) 0.25 MG tablet Take 1 tablet (0.25 mg total) by mouth at bedtime as needed for anxiety. 10/17/16   Lloyd Huger, MD  baclofen (LIORESAL) 10 MG tablet Take 10 mg by mouth at bedtime. 01/14/17   [provider]  co-enzyme Q-10 30 MG capsule Take 30 mg by mouth daily.    [provider]  diphenoxylate-atropine (LOMOTIL) 2.5-0.025 MG tablet Take 1 tablet by mouth 4 (four) times daily as needed for diarrhea or loose  stools. 02/06/17   Jacquelin Hawking, NP  DULoxetine (CYMBALTA) 60 MG capsule Take 1 capsule (60 mg total) by mouth daily. 12/15/14   Kathrine Haddock, NP  lidocaine-prilocaine (EMLA) cream Apply to affected area once 10/20/16   Lloyd Huger, MD  losartan-hydrochlorothiazide (HYZAAR) 100-25 MG tablet Take 1 tablet by mouth daily.  05/12/16   [provider]  montelukast (SINGULAIR) 10 MG tablet Take 10 mg by mouth daily.  05/23/16   [provider]  Omega-3 Fatty Acids (FISH OIL PO) Take 1 capsule by mouth daily.     [provider]  omeprazole (PRILOSEC) 20 MG capsule Take 20 mg by mouth daily.    [provider]  ondansetron (ZOFRAN) 8 MG tablet Take 1 tablet (8 mg total) by mouth 2 (two) times daily as needed for refractory nausea / vomiting. 10/20/16   Lloyd Huger, MD  oxyCODONE-acetaminophen (ROXICET) 5-325 MG tablet Take 1 tablet by mouth every 6 (six) hours as needed. 01/25/17   Lloyd Huger, MD  Potassium Chloride ER 20 MEQ TBCR Take 1 tablet by mouth daily. 12/18/16   Lloyd Huger, MD  prochlorperazine (COMPAZINE) 10 MG tablet Take 1 tablet (10 mg total) by mouth every 6 (six) hours as needed (Nausea or vomiting). 10/20/16   Lloyd Huger, MD  promethazine (PHENERGAN) 25 MG tablet TAKE 1 TABLET BY MOUTH EVERY 6-8 HOURS AS NEEDED FOR NAUSEA 11/04/16   [provider]  triamcinolone ointment (KENALOG) 0.5 % Apply 1 application topically 2 (two) times daily. Rash on bilateral upper extremities 11/29/16   Jacquelin Hawking, NP      VITAL SIGNS:  There were no vitals taken for this visit.  PHYSICAL EXAMINATION:   Physical Exam  Constitutional: She is oriented to person, place, and time and well-developed, well-nourished, and in no distress. No distress.  HENT:  Head: Normocephalic.  Eyes: No scleral icterus.  Neck: Normal range of motion. Neck supple. No JVD present. No tracheal deviation present.  Cardiovascular:  Normal rate, regular rhythm and normal heart sounds.  Exam reveals no gallop and no friction rub.   No murmur heard. Pulmonary/Chest: Effort normal and breath sounds normal. No respiratory distress. She has no wheezes. She has no rales. She exhibits no tenderness.  Abdominal: Soft. Bowel sounds are normal. She exhibits no distension and no mass. There is no tenderness. There is no rebound and no guarding.  Musculoskeletal: Normal range of motion. She exhibits no edema.  Neurological: She is alert and oriented to person, place, and time.  Skin: Skin is warm. No rash noted. No erythema.  Psychiatric: Affect and judgment normal.      LABORATORY PANEL:   CBC  Recent Labs Lab 02/07/17 1204  WBC 5.1  HGB 9.5*  HCT 28.7*  PLT 122*   ------------------------------------------------------------------------------------------------------------------  Chemistries   Recent Labs Lab 02/07/17 1204  NA 129*  K 3.0*  CL 93*  CO2 21*  GLUCOSE 184*  BUN 60*  CREATININE 3.15*  CALCIUM 8.5*  MG 1.8  AST 21  ALT 16  ALKPHOS 85  BILITOT 0.7   ------------------------------------------------------------------------------------------------------------------  Cardiac Enzymes No results for input(s): TROPONINI in the last 168 hours. ------------------------------------------------------------------------------------------------------------------  RADIOLOGY:  No results found.  EKG:   Orders placed or performed during the hospital encounter of 01/22/17  . ED EKG within 10 minutes  . ED EKG within 10 minutes  . 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  1. AKI in the setting of dehydration and poor po intake in addition losartan/HCTZ: Start NS with KCL and  follow up BMP in am Stop losartan/Hctz for now  2. Hypotension from dehydration and BP medications Stop BP medications and continue IVF  3. Hypokalemia: Replete and recheck in am Check Mg level as well as it was recently low on 10/13  4. Hyponatremia: This is from dehydration and HCTZ.  5. Breast Cancer: Consult Dr Grayland Ormond if needed    All the records are reviewed and case discussed with ED provider. Management plans discussed with the patient and she in agreement  CODE STATUS: FULL  TOTAL TIME TAKING CARE OF THIS PATIENT: 42 minutes.    Shaely Gadberry M.D on 02/07/2017 at 1:52 PM  Between 7am to 6pm - Pager - [phone]  After 6pm go to [email or web address] - password [email or web address] [facility]  Office  [phone]  CC: Primary care physician; Sharyne Peach, MD

## 2017-02-08 LAB — CBC
HEMATOCRIT: 23.2 % — AB (ref 35.0–47.0)
HEMOGLOBIN: 7.6 g/dL — AB (ref 12.0–16.0)
MCH: 31.9 pg (ref 26.0–34.0)
MCHC: 32.7 g/dL (ref 32.0–36.0)
MCV: 97.6 fL (ref 80.0–100.0)
Platelets: 87 10*3/uL — ABNORMAL LOW (ref 150–440)
RBC: 2.38 MIL/uL — AB (ref 3.80–5.20)
RDW: 19.2 % — ABNORMAL HIGH (ref 11.5–14.5)
WBC: 5.8 10*3/uL (ref 3.6–11.0)

## 2017-02-08 LAB — BASIC METABOLIC PANEL
ANION GAP: 7 (ref 5–15)
BUN: 45 mg/dL — ABNORMAL HIGH (ref 6–20)
CO2: 21 mmol/L — ABNORMAL LOW (ref 22–32)
Calcium: 8.1 mg/dL — ABNORMAL LOW (ref 8.9–10.3)
Chloride: 106 mmol/L (ref 101–111)
Creatinine, Ser: 1.75 mg/dL — ABNORMAL HIGH (ref 0.44–1.00)
GFR, EST AFRICAN AMERICAN: 32 mL/min — AB (ref >60)
GFR, EST NON AFRICAN AMERICAN: 27 mL/min — AB (ref >60)
GLUCOSE: 102 mg/dL — AB (ref 65–99)
POTASSIUM: 3.2 mmol/L — AB (ref 3.5–5.1)
SODIUM: 134 mmol/L — AB (ref 135–145)

## 2017-02-08 LAB — FERRITIN: Ferritin: 813 ng/mL — ABNORMAL HIGH (ref 11–307)

## 2017-02-08 MED ORDER — POTASSIUM CHLORIDE 20 MEQ PO PACK
40.0000 meq | PACK | Freq: Once | ORAL | Status: AC
  Administered 2017-02-08: 40 meq via ORAL
  Filled 2017-02-08: qty 2

## 2017-02-08 NOTE — Progress Notes (Signed)
Patient ID: Krista Cisneros, female   DOB: 1940-06-02, 76 y.o.   MRN: [id number]  [facility] PROGRESS NOTE  Brandan Glauber GYK:[id number] DOB: 02-15-1941 DOA: 02/07/2017 PCP: Sharyne Peach, MD  HPI/Subjective: Patient states that she normally gets diarrhea around her chemotherapy. She has not been eating very well. The food tastes like sawdust. Last chemotherapy was on Tuesday with Dr. Grayland Ormond.  Was sent in for direct admission for weakness and electrolyte abnormalities and acute kidney injury.  Objective: Vitals:   02/08/17 0804 02/08/17 1335  BP: (!) 120/56 (!) 124/48  Pulse: 83 87  Resp: 18   Temp: 97.9 F (36.6 C) 98.5 F (36.9 C)  SpO2: 95% 96%    Filed Weights   02/07/17 1538 02/08/17 0500  Weight: 73.9 kg (163 lb) 75.3 kg (166 lb)    ROS: Review of Systems  Constitutional: Negative for chills and fever.  Eyes: Negative for blurred vision.  Respiratory: Negative for cough and shortness of breath.   Cardiovascular: Negative for chest pain.  Gastrointestinal: Positive for diarrhea. Negative for abdominal pain, constipation, nausea and vomiting.  Genitourinary: Negative for dysuria.  Musculoskeletal: Negative for joint pain.  Neurological: Negative for dizziness and headaches.   Exam: Physical Exam  Constitutional: She is oriented to person, place, and time.  HENT:  Nose: No mucosal edema.  Mouth/Throat: No oropharyngeal exudate or posterior oropharyngeal edema.  Eyes: Pupils are equal, round, and reactive to light. Conjunctivae, EOM and lids are normal.  Neck: No JVD present. Carotid bruit is not present. No edema present. No thyroid mass and no thyromegaly present.  Cardiovascular: S1 normal and S2 normal.  Exam reveals no gallop.   No murmur heard. Pulses:      Dorsalis pedis pulses are 2+ on the right side, and 2+ on the left side.  Respiratory: No respiratory distress. She has no wheezes. She has no rhonchi. She has no rales.  GI: Soft. Bowel  sounds are normal. There is no tenderness.  Musculoskeletal:       Right ankle: She exhibits no swelling.       Left ankle: She exhibits no swelling.  Lymphadenopathy:    She has no cervical adenopathy.  Neurological: She is alert and oriented to person, place, and time. No cranial nerve deficit.  Skin: Skin is warm. No rash noted. Nails show no clubbing.  Psychiatric: She has a normal mood and affect.      Data Reviewed: Basic Metabolic Panel:  Recent Labs Lab 02/02/17 1146 02/02/17 1147 02/07/17 1204 02/08/17 0455  NA 132*  --  129* 134*  K 3.7  --  3.0* 3.2*  CL 96*  --  93* 106  CO2 28  --  21* 21*  GLUCOSE 142*  --  184* 102*  BUN 21*  --  60* 45*  CREATININE 1.01*  --  3.15* 1.75*  CALCIUM 8.5*  --  8.5* 8.1*  MG  --  1.3* 1.8  --    Liver Function Tests:  Recent Labs Lab 02/02/17 1146 02/07/17 1204  AST 26 21  ALT 22 16  ALKPHOS 94 85  BILITOT 0.8 0.7  PROT 6.5 6.7  ALBUMIN 2.9* 2.9*   CBC:  Recent Labs Lab 02/02/17 1146 02/07/17 1204 02/08/17 0455  WBC 41.7* 5.1 5.8  NEUTROABS 39.1* 2.4  --   HGB 9.8* 9.5* 7.6*  HCT 30.7* 28.7* 23.2*  MCV 98.4 98.6 97.6  PLT 230 122* 87*   Cardiac Enzymes:  Scheduled Meds: .  baclofen  10 mg Oral QHS  . heparin  5,000 Units Subcutaneous Q8H  . mometasone-formoterol  2 puff Inhalation BID  . pantoprazole  40 mg Oral Daily   Continuous Infusions: . 0.9 % NaCl with KCl 20 mEq / L 40 mL/hr at 02/08/17 0903    Assessment/Plan:   1. Acute kidney injury. Continue IV fluid hydration. Creatinine has come down nicely from 3.15 down to 1.75.  Hold lisinopril hydrochlorothiazide.  Blood pressure low on presentation 2. Diarrhea has resolved and likely secondary to chemotherapy 3. Anemia of chronic disease. May end up needing a transfusion during the hospital course. I will get a type and cross and hemoglobin tomorrow morning. 4. Breast cancer history on chemotherapy as per Dr. Grayland Ormond 5. Hypokalemia potassium  in IV fluids. Oral potassium 6. Hypomagnesemia replaced IV check another level tomorrow morning 7. Essential hypertension blood pressure fine off medications at this point  Code Status:     Code Status Orders        Start     Ordered   02/07/17 1703  Full code  Continuous     02/07/17 1703    Code Status History    Date Active Date Inactive Code Status Order ID Comments User Context   This patient has a current code status but no historical code status.     Disposition Plan: To be determined  Time spent: 25 minutes  [place], [place]

## 2017-02-08 NOTE — Progress Notes (Signed)
Medications administered by student RN [id number] with supervision of Clinical Instructor Gaia Gullikson MSN, RN-BC or patient's assigned RN.   

## 2017-02-08 NOTE — Progress Notes (Signed)
PHARMACY CONSULT NOTE - INITIAL   Pharmacy Consult for Electrolyte monitoring and replacement.  Patient Measurements: Height: 5\' 3"  (160 cm) Weight: 166 lb (75.3 kg) IBW/kg (Calculated) : 52.4  Vital Signs: Temp: 97.9 F (36.6 C) (10/18 0804) Temp Source: Oral (10/18 0804) BP: 120/56 (10/18 0804) Pulse Rate: 83 (10/18 0804) Intake/Output from previous day: 10/17 0701 - 10/18 0700 In: 879.2 [I.V.:829.2; IV Piggyback:50] Out: 300 [Urine:300] Intake/Output from this shift: Total I/O In: 240 [P.O.:240] Out: 250 [Urine:250]   Recent Labs  02/07/17 1204 02/08/17 0455  WBC 5.1 5.8  HGB 9.5* 7.6*  HCT 28.7* 23.2*  PLT 122* 87*  CREATININE 3.15* 1.75*  MG 1.8  --   ALBUMIN 2.9*  --   PROT 6.7  --   AST 21  --   ALT 16  --   ALKPHOS 85  --   BILITOT 0.7  --    Estimated Creatinine Clearance: 27 mL/min (A) (by C-G formula based on SCr of 1.75 mg/dL (H)).   Assessment: 76 yo female admitted with AKI and dehydration. Pharmacy consulted for electrolyte monitoring and replacement.   10/17: K: 3.0    Mg: 1.8 10/18 K 3.2   Goal of Therapy:  K: 3.5-5.1 mmol/L Mg: 1.9-2.4 mg/dL  Plan:  Patient is receiving NS w/KCL11mEq/L @ 173mL/hr. Will replace K+ with KCL 75mEq x 1 dose   Will recheck electrolytes with AM labs and continue to replace as needed.    Thomasenia Sales, PharmD, BCPS Clinical Pharmacist 02/08/2017 10:22 AM

## 2017-02-09 LAB — URINALYSIS, COMPLETE (UACMP) WITH MICROSCOPIC
BILIRUBIN URINE: NEGATIVE
Glucose, UA: NEGATIVE mg/dL
KETONES UR: NEGATIVE mg/dL
Nitrite: POSITIVE — AB
PH: 5 (ref 5.0–8.0)
Protein, ur: NEGATIVE mg/dL
Specific Gravity, Urine: 1.011 (ref 1.005–1.030)

## 2017-02-09 LAB — BASIC METABOLIC PANEL
Anion gap: 5 (ref 5–15)
BUN: 27 mg/dL — AB (ref 6–20)
CHLORIDE: 110 mmol/L (ref 101–111)
CO2: 22 mmol/L (ref 22–32)
CREATININE: 1.45 mg/dL — AB (ref 0.44–1.00)
Calcium: 8.1 mg/dL — ABNORMAL LOW (ref 8.9–10.3)
GFR calc Af Amer: 40 mL/min — ABNORMAL LOW (ref >60)
GFR calc non Af Amer: 34 mL/min — ABNORMAL LOW (ref >60)
Glucose, Bld: 102 mg/dL — ABNORMAL HIGH (ref 65–99)
Potassium: 3.3 mmol/L — ABNORMAL LOW (ref 3.5–5.1)
Sodium: 137 mmol/L (ref 135–145)

## 2017-02-09 LAB — PHOSPHORUS: PHOSPHORUS: 2.3 mg/dL — AB (ref 2.5–4.6)

## 2017-02-09 LAB — HEMOGLOBIN: Hemoglobin: 7.5 g/dL — ABNORMAL LOW (ref 12.0–16.0)

## 2017-02-09 LAB — MAGNESIUM: Magnesium: 1.5 mg/dL — ABNORMAL LOW (ref 1.7–2.4)

## 2017-02-09 LAB — PREPARE RBC (CROSSMATCH)

## 2017-02-09 LAB — OCCULT BLOOD X 1 CARD TO LAB, STOOL: Fecal Occult Bld: POSITIVE — AB

## 2017-02-09 LAB — ABO/RH: ABO/RH(D): O POS

## 2017-02-09 MED ORDER — POTASSIUM CHLORIDE CRYS ER 20 MEQ PO TBCR
40.0000 meq | EXTENDED_RELEASE_TABLET | Freq: Once | ORAL | Status: AC
  Administered 2017-02-09: 40 meq via ORAL
  Filled 2017-02-09: qty 2

## 2017-02-09 MED ORDER — ACETAMINOPHEN 325 MG PO TABS
650.0000 mg | ORAL_TABLET | Freq: Once | ORAL | Status: AC
  Administered 2017-02-09: 650 mg via ORAL
  Filled 2017-02-09: qty 2

## 2017-02-09 MED ORDER — SODIUM CHLORIDE 0.9 % IV SOLN
Freq: Once | INTRAVENOUS | Status: AC
  Administered 2017-02-09: 15:00:00 via INTRAVENOUS

## 2017-02-09 MED ORDER — K PHOS MONO-SOD PHOS DI & MONO 155-852-130 MG PO TABS
500.0000 mg | ORAL_TABLET | ORAL | Status: AC
  Administered 2017-02-09 (×2): 500 mg via ORAL
  Filled 2017-02-09 (×2): qty 2

## 2017-02-09 MED ORDER — CIPROFLOXACIN HCL 500 MG PO TABS
500.0000 mg | ORAL_TABLET | Freq: Two times a day (BID) | ORAL | Status: DC
  Administered 2017-02-09 – 2017-02-10 (×2): 500 mg via ORAL
  Filled 2017-02-09 (×2): qty 1

## 2017-02-09 MED ORDER — FUROSEMIDE 10 MG/ML IJ SOLN
20.0000 mg | Freq: Once | INTRAMUSCULAR | Status: AC
  Administered 2017-02-09: 15:00:00 20 mg via INTRAVENOUS
  Filled 2017-02-09: qty 2

## 2017-02-09 MED ORDER — MAGNESIUM SULFATE 4 GM/100ML IV SOLN
4.0000 g | Freq: Once | INTRAVENOUS | Status: AC
  Administered 2017-02-09: 10:00:00 4 g via INTRAVENOUS
  Filled 2017-02-09: qty 100

## 2017-02-09 MED ORDER — LOPERAMIDE HCL 2 MG PO CAPS
2.0000 mg | ORAL_CAPSULE | Freq: Once | ORAL | Status: AC
  Administered 2017-02-09: 2 mg via ORAL
  Filled 2017-02-09: qty 1

## 2017-02-09 NOTE — Progress Notes (Signed)
Patient ID: Krista Cisneros, female   DOB: May 21, 1940, 76 y.o.   MRN: [id number]   [facility] PROGRESS NOTE  Shaquoia Miers YBO:[id number] DOB: [birth year] DOA: 02/07/2017 PCP: Sharyne Peach, MD  HPI/Subjective: Patient had 3 episodes of diarrhea since I saw her last.  No complaints of abdominal pain. Did have some shortness of breath.  Objective: Vitals:   02/09/17 1443 02/09/17 1515  BP: (!) 157/54 (!) 155/55  Pulse: 91 91  Resp: 16 16  Temp: 98.6 F (37 C) 98.2 F (36.8 C)  SpO2: 99% 100%    Filed Weights   02/07/17 1538 02/08/17 0500  Weight: 73.9 kg (163 lb) 75.3 kg (166 lb)    ROS: Review of Systems  Constitutional: Negative for chills and fever.  Eyes: Negative for blurred vision.  Respiratory: Positive for shortness of breath. Negative for cough.   Cardiovascular: Negative for chest pain.  Gastrointestinal: Positive for diarrhea. Negative for abdominal pain, constipation, nausea and vomiting.  Genitourinary: Negative for dysuria.  Musculoskeletal: Negative for joint pain.  Neurological: Negative for dizziness and headaches.   Exam: Physical Exam  Constitutional: She is oriented to person, place, and time.  HENT:  Nose: No mucosal edema.  Mouth/Throat: No oropharyngeal exudate or posterior oropharyngeal edema.  Eyes: Pupils are equal, round, and reactive to light. Conjunctivae, EOM and lids are normal.  Neck: No JVD present. Carotid bruit is not present. No edema present. No thyroid mass and no thyromegaly present.  Cardiovascular: S1 normal and S2 normal.  Exam reveals no gallop.   No murmur heard. Pulses:      Dorsalis pedis pulses are 2+ on the right side, and 2+ on the left side.  Respiratory: No respiratory distress. She has no wheezes. She has no rhonchi. She has no rales.  GI: Soft. Bowel sounds are normal. There is no tenderness.  Musculoskeletal:       Right ankle: She exhibits no swelling.       Left ankle: She exhibits no  swelling.  Lymphadenopathy:    She has no cervical adenopathy.  Neurological: She is alert and oriented to person, place, and time. No cranial nerve deficit.  Skin: Skin is warm. No rash noted. Nails show no clubbing.  Psychiatric: She has a normal mood and affect.      Data Reviewed: Basic Metabolic Panel:  Recent Labs Lab 02/07/17 1204 02/08/17 0455 02/09/17 0404  NA 129* 134* 137  K 3.0* 3.2* 3.3*  CL 93* 106 110  CO2 21* 21* 22  GLUCOSE 184* 102* 102*  BUN 60* 45* 27*  CREATININE 3.15* 1.75* 1.45*  CALCIUM 8.5* 8.1* 8.1*  MG 1.8  --  1.5*  PHOS  --   --  2.3*   Liver Function Tests:  Recent Labs Lab 02/07/17 1204  AST 21  ALT 16  ALKPHOS 85  BILITOT 0.7  PROT 6.7  ALBUMIN 2.9*   CBC:  Recent Labs Lab 02/07/17 1204 02/08/17 0455 02/09/17 0404  WBC 5.1 5.8  --   NEUTROABS 2.4  --   --   HGB 9.5* 7.6* 7.5*  HCT 28.7* 23.2*  --   MCV 98.6 97.6  --   PLT 122* 87*  --    Cardiac Enzymes:  Scheduled Meds: . baclofen  10 mg Oral QHS  . ciprofloxacin  500 mg Oral BID  . mometasone-formoterol  2 puff Inhalation BID  . pantoprazole  40 mg Oral Daily    Assessment/Plan:   1. Acute  kidney injury. Continue IV fluid hydration. Creatinine has come down nicely from 3.15 down to 1.45.  Hold lisinopril hydrochlorothiazide.  Blood pressure low on presentation 2. Diarrhea, secondary to chemotherapy. 1 dose of Imodium given 3. Anemia of chronic disease. I did transfuse her 1 unit of packed red blood cells today. Recheck hemoglobin tomorrow 4. Hypokalemia potassium in IV fluids. Oral potassium. Recheck tomorrow. Stop IV fluids before blood. 5. Hypomagnesemia-  replaced IV again today and recheck tomorrow 6. History of breast cancer follow-up as outpatient with Dr. Grayland Ormond 7. Essential hypertension blood pressure fine off medications at this point 8. Weakness. Physical therapy recommended home with home health  Code Status:     Code Status Orders         Start     Ordered   02/07/17 1703  Full code  Continuous     02/07/17 1703    Code Status History    Date Active Date Inactive Code Status Order ID Comments User Context   This patient has a current code status but no historical code status.     Disposition Plan: home with home health, potentially tomorrow  Time spent: 25 minutes.   Loletha Grayer  [employer]

## 2017-02-09 NOTE — Progress Notes (Signed)
PHARMACY CONSULT NOTE - FOLLOW UP  Pharmacy Consult for Electrolyte monitoring and replacement  Patient Measurements: Height: 5\' 3"  (160 cm) Weight: 166 lb (75.3 kg) IBW/kg (Calculated) : 52.4  Vital Signs: Temp: 98.6 F (37 C) (10/19 0856) Temp Source: Oral (10/19 0856) BP: 131/63 (10/19 0856) Pulse Rate: 83 (10/19 0856) Intake/Output from previous day: 10/18 0701 - 10/19 0700 In: 1819.7 [P.O.:240; I.V.:1579.7] Out: 750 [Urine:750] Intake/Output from this shift: No intake/output data recorded.   Recent Labs  02/07/17 1204 02/08/17 0455 02/09/17 0404  WBC 5.1 5.8  --   HGB 9.5* 7.6*  --   HCT 28.7* 23.2*  --   PLT 122* 87*  --   CREATININE 3.15* 1.75* 1.45*  MG 1.8  --  1.5*  PHOS  --   --  2.3*  ALBUMIN 2.9*  --   --   PROT 6.7  --   --   AST 21  --   --   ALT 16  --   --   ALKPHOS 85  --   --   BILITOT 0.7  --   --    Estimated Creatinine Clearance: 32.6 mL/min (A) (by C-G formula based on SCr of 1.45 mg/dL (H)).   Assessment: 76 yo female directly admitted with electrolyte abnormalities. Patient is undergoing chemotherapy treatment PTA and experience diarrhea and decreased PO intake.  K = 3.3, Mg = 1.5, Phos = 2.3 today  Goal of Therapy: Electrolytes WNL  Plan:  Patient is receiving NS w/KCL38mEq/L @ 40 mL/hr (~19 mEq/day).  Will order the following supplementation:  Magnesium 4 g IV once  KCl 40 mEq PO once  K-Phos Neutra 500 mg q4h x 2 doses  Will recheck electrolytes with AM labs and continue to replace as needed.    Lenis Noon, PharmD, BCPS Clinical Pharmacist 02/09/2017 9:35 AM

## 2017-02-09 NOTE — Care Management Note (Addendum)
Case Cisneros Note  Patient Details  Name: Krista Cisneros MRN: [id number] Date of Birth: 07-Aug-1940  Subjective/Objective:   Admitted to [facility] with the diagnosis of acute kidney injury. Lives with daughter, Krista Cisneros [phone]). Last seen Dr.George in July. Prescriptions are filled at [facility] in [place]. No home health. No skilled facility. No home oxygen. Bedside commode and rolling walker in the home. Takes care of all basic activities of daily living herself, can drive,if needed.  Fell August 08, 2022. Deceased appetite. Lost 20 pounds since July.    Family will transport               Action/Plan: physical therapy evaluation done. Recommended Home with Home Health and physical  Chose [facility]   Expected Discharge Date:                  Expected Discharge Plan:     In-House Referral:     Discharge planning Services     Post Acute Care Choice:   yes Choice offered to:   Krista Cisneros  DME Arranged:    DME Agency:     HH Arranged:   yes HH Agency:   [facility]  Status of Service:     If discussed at [place] of Stay Meetings, dates discussed:    Additional Comments:  Krista Cisneros, Krista Cisneros [phone] 02/09/2017, 2:14 PM

## 2017-02-09 NOTE — Care Management Important Message (Signed)
Important Message  Patient Details  Name: Alexandra Lipps MRN: [id number] Date of Birth: 1941/01/16   Medicare Important Message Given:  Yes    Shelbie Ammons, RN 02/09/2017, 8:25 AM

## 2017-02-09 NOTE — Evaluation (Signed)
Physical Therapy Evaluation Patient Details Name: Krista Cisneros MRN: [id number] DOB: 06/28/1940 Today's Date: 02/09/2017   History of Present Illness  Patient is a pleasant 76 year old female admitted to hospital on 02/07/17 for weakness and electrolyte abnormalities. Patient has a history of R  breast cancer and had a change in blood pressure medication leading to increased dehydration. Acute kidney injury resulted.   Clinical Impression  Patient is a pleasant 76 year old female who presents with gross weakness of BLEs. Patient demonstrates ability to ambulate 160 ft with walker and CGA, however is limited in further ambulatory capacity at this time due to fatigue. Ambulation deteriorated with fatigue increasing shuffling preference and decreased velocity. Pt. Requires BUE support to perform dynamic tasks at this time as well as to transfer from sit to stand due to weak LE's. Patient will benefit from skilled physical therapy to improve mobility to return to previous level of function.     Follow Up Recommendations Home health PT    Equipment Recommendations  Other (comment) (shower chair)    Recommendations for Other Services       Precautions / Restrictions Precautions Precautions: Fall Restrictions Weight Bearing Restrictions: No Other Position/Activity Restrictions: L chest port      Mobility  Bed Mobility Overal bed mobility: Independent             General bed mobility comments: Independent roll, side lie, supine <> sit  Transfers Overall transfer level: Modified independent Equipment used: Standard walker             General transfer comment: Supervision, needs UE assistance.   Ambulation/Gait Ambulation/Gait assistance: Min guard;Supervision Ambulation Distance (Feet): 160 Feet Assistive device: Standard walker Gait Pattern/deviations: Step-through pattern;Decreased stride length;Shuffle;Trunk flexed Gait velocity: .46 m/s gait speed  Gait  velocity interpretation: Below normal speed for age/gender General Gait Details: decreased velocity, shuffle due to limited ambulatory capacity from LE fatigue.   Stairs            Wheelchair Mobility    Modified Rankin (Stroke Patients Only)       Balance Overall balance assessment: Modified Independent;Needs assistance Sitting-balance support: Feet supported Sitting balance-Leahy Scale: Fair Sitting balance - Comments: Require feet supported, able to reach across midline without LOB.      Standing balance-Leahy Scale: Fair Standing balance comment: Able to maintain standing without UE support 28 seconds, requires UE support for dynamic balance such as gait.                              Pertinent Vitals/Pain Pain Assessment: No/denies pain    Home Living Family/patient expects to be discharged to:: Private residence Living Arrangements: Children Available Help at Discharge: Family Type of Home: House Home Access: Stairs to enter Entrance Stairs-Rails: Right Entrance Stairs-Number of Steps: 3 stairs Home Layout: Two level Home Equipment: Walker - 2 wheels;Grab bars - tub/shower;Grab bars - toilet Additional Comments: Patient lives with daughter who has lymphedema, currently resides on top level of house, bathrooms downstairs are handicap for daughter.     Prior Function Level of Independence: Independent         Comments: Patient occasionally used walker when ambulating outside. Was independent within and outside the home. On current chemo/oncology plan.      Hand Dominance        Extremity/Trunk Assessment   Upper Extremity Assessment Upper Extremity Assessment: Overall WFL for tasks assessed  Lower Extremity Assessment Lower Extremity Assessment: Generalized weakness (RLE and LLE gross 4/5 strength seated EOB. )    Cervical / Trunk Assessment Cervical / Trunk Assessment: Normal  Communication   Communication: No difficulties   Cognition Arousal/Alertness: Awake/alert Behavior During Therapy: WFL for tasks assessed/performed Overall Cognitive Status: Within Functional Limits for tasks assessed                                 General Comments: A & O x 4      General Comments General comments (skin integrity, edema, etc.): cut noted on R forearm, nonpainful no active bleeding. No edema noted in limbs.     Exercises Other Exercises Other Exercises: supine activities performed bilaterally : ankle pumps 10x,  abduction 8x, heel slides 8x Other Exercises: Transfers: Supine<> EOB, sit to stand with BUE support and CGA.  Other Exercises: Ambulation: Ambulate 160 ft with standard walker and CGA. Cues for lifting LE's to decrease shuffle.    Assessment/Plan    PT Assessment Patient needs continued PT services  PT Problem List Decreased strength;Decreased activity tolerance;Decreased balance;Decreased mobility       PT Treatment Interventions DME instruction;Gait training;Stair training;Functional mobility training;Therapeutic activities;Therapeutic exercise;Balance training;Patient/family education;Neuromuscular re-education    PT Goals (Current goals can be found in the Care Plan section)  Acute Rehab PT Goals Patient Stated Goal: To return home at previous level of function.  PT Goal Formulation: With patient Time For Goal Achievement: 02/23/17 Potential to Achieve Goals: Good    Frequency Min 2X/week   Barriers to discharge   needs to negotiate a flight of stairs with single railing     Co-evaluation               AM-PAC PT "6 Clicks" Daily Activity  Outcome Measure Difficulty turning over in bed (including adjusting bedclothes, sheets and blankets)?: A Little Difficulty moving from lying on back to sitting on the side of the bed? : A Little Difficulty sitting down on and standing up from a chair with arms (e.g., wheelchair, bedside commode, etc,.)?: Unable Help needed moving to  and from a bed to chair (including a wheelchair)?: A Little Help needed walking in hospital room?: A Little Help needed climbing 3-5 steps with a railing? : A Lot 6 Click Score: 15    End of Session Equipment Utilized During Treatment: Gait belt Activity Tolerance: Patient tolerated treatment well;Patient limited by fatigue Patient left: in bed;with call bell/phone within reach;with bed alarm set;with nursing/sitter in room Nurse Communication: Other (comment) (leaving walker in room for patient use with supervision) PT Visit Diagnosis: Other abnormalities of gait and mobility (R26.89);Muscle weakness (generalized) (M62.81);Unsteadiness on feet (R26.81)    Time: 6195-0932 PT Time Calculation (min) (ACUTE ONLY): 27 min   Charges:   PT Evaluation $PT Eval Low Complexity: 1 Low PT Treatments $Therapeutic Exercise: 8-22 mins   PT G Codes:   PT G-Codes **NOT FOR INPATIENT CLASS** Functional Assessment Tool Used: AM-PAC 6 Clicks Basic Mobility;Clinical judgement Functional Limitation: Mobility: Walking and moving around Mobility: Walking and Moving Around Current Status (I7124): At least 20 percent but less than 40 percent impaired, limited or restricted Mobility: Walking and Moving Around Goal Status [phone]): At least 1 percent but less than 20 percent impaired, limited or restricted    Janna Arch, PT, DPT    Janna Arch 02/09/2017, 11:21 AM

## 2017-02-10 LAB — BPAM RBC
BLOOD PRODUCT EXPIRATION DATE: 201811142359
ISSUE DATE / TIME: 201810191448
UNIT TYPE AND RH: 5100

## 2017-02-10 LAB — CBC
HEMATOCRIT: 28.2 % — AB (ref 35.0–47.0)
HEMOGLOBIN: 9.5 g/dL — AB (ref 12.0–16.0)
MCH: 32.1 pg (ref 26.0–34.0)
MCHC: 33.5 g/dL (ref 32.0–36.0)
MCV: 95.9 fL (ref 80.0–100.0)
Platelets: 76 10*3/uL — ABNORMAL LOW (ref 150–440)
RBC: 2.94 MIL/uL — AB (ref 3.80–5.20)
RDW: 19.5 % — ABNORMAL HIGH (ref 11.5–14.5)
WBC: 8.9 10*3/uL (ref 3.6–11.0)

## 2017-02-10 LAB — BASIC METABOLIC PANEL
ANION GAP: 6 (ref 5–15)
BUN: 18 mg/dL (ref 6–20)
CO2: 20 mmol/L — AB (ref 22–32)
Calcium: 8.2 mg/dL — ABNORMAL LOW (ref 8.9–10.3)
Chloride: 108 mmol/L (ref 101–111)
Creatinine, Ser: 1.38 mg/dL — ABNORMAL HIGH (ref 0.44–1.00)
GFR calc Af Amer: 42 mL/min — ABNORMAL LOW (ref >60)
GFR, EST NON AFRICAN AMERICAN: 36 mL/min — AB (ref >60)
GLUCOSE: 106 mg/dL — AB (ref 65–99)
Potassium: 3.2 mmol/L — ABNORMAL LOW (ref 3.5–5.1)
Sodium: 134 mmol/L — ABNORMAL LOW (ref 135–145)

## 2017-02-10 LAB — TYPE AND SCREEN
ABO/RH(D): O POS
ANTIBODY SCREEN: NEGATIVE
UNIT DIVISION: 0

## 2017-02-10 LAB — MAGNESIUM: MAGNESIUM: 1.8 mg/dL (ref 1.7–2.4)

## 2017-02-10 LAB — PHOSPHORUS: Phosphorus: 2.9 mg/dL (ref 2.5–4.6)

## 2017-02-10 MED ORDER — CIPROFLOXACIN HCL 500 MG PO TABS: 500.0000 mg | ORAL_TABLET | Freq: Two times a day (BID) | ORAL | 0 refills | Status: AC

## 2017-02-10 MED ORDER — HEPARIN SOD (PORK) LOCK FLUSH 100 UNIT/ML IV SOLN
500.0000 [IU] | Freq: Once | INTRAVENOUS | Status: AC
  Administered 2017-02-10: 500 [IU] via INTRAVENOUS
  Filled 2017-02-10: qty 5

## 2017-02-10 MED ORDER — POTASSIUM CHLORIDE CRYS ER 20 MEQ PO TBCR: 40.0000 meq | EXTENDED_RELEASE_TABLET | Freq: Every day | ORAL | 0 refills | Status: DC

## 2017-02-10 MED ORDER — POTASSIUM CHLORIDE CRYS ER 20 MEQ PO TBCR
40.0000 meq | EXTENDED_RELEASE_TABLET | Freq: Once | ORAL | Status: AC
  Administered 2017-02-10: 40 meq via ORAL
  Filled 2017-02-10: qty 2

## 2017-02-10 NOTE — Care Management Note (Signed)
Case Management Note  Patient Details  Name: Krista Cisneros MRN: [id number] Date of Birth: 28-Aug-1940  Subjective/Objective:    A referral for home health PT, Aide was called to Malachy Mood at [facility] per Ms Villasenor chose [facility] to be her home health services provider. Ms Lapre already has a BSC and a RW at home.                 Action/Plan:   Expected Discharge Date:  02/10/17               Expected Discharge Plan:  [place]  In-House Referral:  NA  Discharge planning Services  CM Consult  Post Acute Care Choice:  Home Health Choice offered to:  Patient  DME Arranged:  N/A, NIV DME Agency:  NA  HH Arranged:  PT, Nurse's Aide HH Agency:  [place]  Status of Service:  Completed, signed off  If discussed at [place] of Stay Meetings, dates discussed:    Additional Comments:  Jerrin Recore A, RN 02/10/2017, 11:04 AM

## 2017-02-10 NOTE — Progress Notes (Signed)
Patient discharged home with home health. All discharge instructions given and all questions answered. 

## 2017-02-10 NOTE — Discharge Summary (Signed)
[facility] - [place] at [facility], 76 y.o., DOB 01-23-1941, MRN [id number]. Admission date: 02/07/2017 Discharge Date 02/10/2017 Primary MD Sharyne Peach, MD Admitting Physician Bettey Costa, MD  Admission Diagnosis  Acute Renal Failure  Discharge Diagnosis   Active Problems: Acute kidney injury in setting of diarrhea and dehydration Hypotension due to dehyration Diarrhea due to chemotherapy Hypokalemia Hyponatremia Anemia of chronic disease with the drop in hemoglobin status post transfusion,acute anemia due to chemotherapy Breast cancer Generalized weakness       Hospital Course  Krista Cisneros  is a 76 y.o. 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 Patient was having diarrhea related to her chemotherapy. She was admitted given IV fluids.her renal function improved with IV hydration. Her electrolytes were replaced. Patient is doing much better. Her potassium is still low which will be orally replaced. Otherwise she is doing much better stable for discharge.           Consults  None  Significant Tests:  See full reports for all details     Dg Chest 2 View  Result Date: 01/22/2017 CLINICAL DATA:  Chest pain and shortness of Breath EXAM: CHEST  2 VIEW COMPARISON:  10/24/2016 FINDINGS: Cardiac shadow is within normal limits. Left chest wall port is seen in satisfactory position. Emphysematous changes are noted in both lungs in the upper lobes but stable. No focal infiltrate or sizable effusion is seen. Mild degenerative changes of thoracic spine are noted. IMPRESSION: COPD without acute abnormality. The overall appearance is stable from the prior study. Electronically Signed   By: Inez Catalina M.D.   On: 01/22/2017 16:06   Ct Angio  Chest Pe W And/or Wo Contrast  Result Date: 01/22/2017 CLINICAL DATA:  Right-sided breast cancer with chest pain and shortness of breath EXAM: CT ANGIOGRAPHY CHEST WITH CONTRAST TECHNIQUE: Multidetector CT imaging of the chest was performed using the standard protocol during bolus administration of intravenous contrast. Multiplanar CT image reconstructions and MIPs were obtained to evaluate the vascular anatomy. CONTRAST:  75 mL Isovue 370 intravenous COMPARISON:  Radiographs 01/22/2017, CT chest 04/08/2014, 04/09/2014 FINDINGS: Cardiovascular: Satisfactory opacification of the pulmonary arteries to the segmental level. No evidence of pulmonary embolism. Aortic atherosclerosis. No dissection is seen. Left-sided central venous catheter tip terminates in the proximal right atrium. Borderline to mild cardiomegaly. Coronary artery calcification. No pericardial effusion Mediastinum/Nodes: Midline trachea. No thyroid mass. Mild distension and debris filled esophagus. Nonspecific subcentimeter mediastinal lymph nodes. Subcentimeter right axillary lymph nodes. Moderate to large hiatal hernia. Lungs/Pleura: Moderate severe emphysema. No consolidation, pleural effusion or pneumothorax Upper Abdomen: Stable low-density lesion in the upper pole of right kidney, intermediate density values but no change in size since 2015, favoring benign process. No acute abnormality. Musculoskeletal: No definite acute or suspicious bone lesion is seen. Review of the MIP images confirms the above findings. IMPRESSION: 1. Negative for acute pulmonary embolus or aortic dissection 2. Moderate severe emphysema without consolidation or effusion 3. Moderate hiatal hernia with diffuse esophageal distension and debris 4. Stable 2.6 cm low-density lesion in the upper pole right kidney, favor benign process given lack of interval growth since 2015 Aortic Atherosclerosis (ICD10-I70.0) and Emphysema (ICD10-J43.9). Electronically Signed   By: Donavan Foil M.D.   On: 01/22/2017 19:46   Nm  Cardiac Muga Rest  Result Date: 01/22/2017 CLINICAL DATA:  Breast cancer, undergoing chemotherapy EXAM: NUCLEAR MEDICINE CARDIAC BLOOD POOL IMAGING (MUGA) TECHNIQUE: Cardiac multi-gated acquisition was performed at rest following intravenous injection of Tc-79mlabeled red blood cells. RADIOPHARMACEUTICALS:  21.5 mCi Tc-942mDP in-vitro labeled red blood cells IV COMPARISON:  10/20/2016 FINDINGS: The cine images demonstrate normal left ventricular wall motion. No akinetic or dyskinetic segments are identified. The ejection fraction was calculated at 68%. This is stable when compared to prior examination. IMPRESSION: Normal left ventricular wall motion with left ventricular ejection fraction of 68% (unchanged since prior study). Electronically Signed   By: P.Marijo Sanes.D.   On: 01/22/2017 08:03       Today   Subjective:   PaBeverley AllenderPt doing much better diarrhea much improved  Objective:   Blood pressure (!) 137/49, pulse 84, temperature 98.2 F (36.8 C), temperature source Oral, resp. rate 20, height _0  (1.6 m), weight 166 lb (75.3 kg), SpO2 94 %.  .  Intake/Output Summary (Last 24 hours) at 02/10/17 1353 Last data filed at 02/10/17 0800  Gross per 24 hour  Intake              999 ml  Output                0 ml  Net              999 ml    Exam VITAL SIGNS: Blood pressure (!) 137/49, pulse 84, temperature 98.2 F (36.8 C), temperature source Oral, resp. rate 20, height _1  (1.6 m), weight 166 lb (75.3 kg), SpO2 94 %.  GENERAL:  [age].o.-year-old patient lying in the bed with no acute distress.  EYES: Pupils equal, round, reactive to light and accommodation. No scleral icterus. Extraocular muscles intact.  HEENT: Head atraumatic, normocephalic. Oropharynx and nasopharynx clear.  NECK:  Supple, no jugular venous distention. No thyroid enlargement, no tenderness.  LUNGS: Normal breath sounds bilaterally, no wheezing, rales,rhonchi  or crepitation. No use of accessory muscles of respiration.  CARDIOVASCULAR: S1, S2 normal. No murmurs, rubs, or gallops.  ABDOMEN: Soft, nontender, nondistended. Bowel sounds present. No organomegaly or mass.  EXTREMITIES: No pedal edema, cyanosis, or clubbing.  NEUROLOGIC: Cranial nerves II through XII are intact. Muscle strength 5/5 in all extremities. Sensation intact. Gait not checked.  PSYCHIATRIC: The patient is alert and oriented x 3.  SKIN: No obvious rash, lesion, or ulcer.   Data Review     CBC w Diff: Lab Results  Component Value Date   WBC 8.9 02/10/2017   HGB 9.5 (L) 02/10/2017   HGB 13.5 04/08/2014   HCT 28.2 (L) 02/10/2017   HCT 42.6 04/08/2014   PLT 76 (L) 02/10/2017   PLT 267 04/08/2014   LYMPHOPCT 43 02/07/2017   MONOPCT 10 02/07/2017   EOSPCT 0 02/07/2017   BASOPCT 1 02/07/2017   CMP: Lab Results  Component Value Date   NA 134 (L) 02/10/2017   NA 141 04/08/2014   K 3.2 (L) 02/10/2017   K 3.9 04/08/2014   CL 108 02/10/2017   CL 107 04/08/2014   CO2 20 (L) 02/10/2017   CO2 28 04/08/2014   BUN 18 02/10/2017   BUN 11 04/08/2014   CREATININE 1.38 (H) 02/10/2017   CREATININE 1.09 04/08/2014   PROT 6.7 02/07/2017   ALBUMIN 2.9 (L) 02/07/2017   BILITOT 0.7 02/07/2017   ALKPHOS 85 02/07/2017   AST 21 02/07/2017   ALT  16 02/07/2017  .  Micro Results No results found for this or any previous visit (from the past 240 hour(s)).      Code Status Orders        Start     Ordered   02/07/17 1703  Full code  Continuous     02/07/17 1703    Code Status History    Date Active Date Inactive Code Status Order ID Comments User Context   This patient has a current code status but no historical code status.          Follow-up Information    Sharyne Peach, MD. Schedule an appointment as soon as possible for a visit in 1 week.   Specialty:  Family Medicine Contact information: [place] [address] [phone]         Lloyd Huger, MD. Schedule an appointment as soon as possible for a visit in 3 days.   Specialty:  Oncology Contact information: [place] [address] [phone]           Discharge Medications   Allergies as of 02/10/2017      Reactions   Atorvastatin Other (See Comments)   Muscle Pain   Gabapentin Swelling   Penicillins Swelling      Medication List    TAKE these medications   ADVAIR DISKUS 500-50 MCG/DOSE Aepb Generic drug:  Fluticasone-Salmeterol Inhale 1 puff into the lungs 2 (two) times daily.   ALPRAZolam 0.25 MG tablet Commonly known as:  XANAX Take 1 tablet (0.25 mg total) by mouth at bedtime as needed for anxiety.   baclofen 10 MG tablet Commonly known as:  LIORESAL Take 10 mg by mouth at bedtime.   ciprofloxacin 500 MG tablet Commonly known as:  CIPRO Take 1 tablet (500 mg total) by mouth 2 (two) times daily.   co-enzyme Q-10 30 MG capsule Take 30 mg by mouth daily.   diphenoxylate-atropine 2.5-0.025 MG tablet Commonly known as:  LOMOTIL Take 1 tablet by mouth 4 (four) times daily as needed for diarrhea or loose stools.   DULoxetine 60 MG capsule Commonly known as:  CYMBALTA Take 1 capsule (60 mg total) by mouth daily.   FISH OIL PO Take 1 capsule by mouth daily.   lidocaine-prilocaine cream Commonly known as:  EMLA Apply to affected area once   losartan-hydrochlorothiazide 100-25 MG tablet Commonly known as:  HYZAAR Take 1 tablet by mouth daily.   montelukast 10 MG tablet Commonly known as:  SINGULAIR Take 10 mg by mouth daily.   omeprazole 20 MG capsule Commonly known as:  PRILOSEC Take 20 mg by mouth daily.   ondansetron 8 MG tablet Commonly known as:  ZOFRAN Take 1 tablet (8 mg total) by mouth 2 (two) times daily as needed for refractory nausea / vomiting.   oxyCODONE-acetaminophen 5-325 MG tablet Commonly known as:  ROXICET Take 1 tablet by mouth every 6 (six) hours as needed.   Potassium  Chloride ER 20 MEQ Tbcr Take 1 tablet by mouth daily.   potassium chloride SA 20 MEQ tablet Commonly known as:  K-DUR,KLOR-CON Take 2 tablets (40 mEq total) by mouth daily. This is in addition to ur regular dose of 68mq daily   prochlorperazine 10 MG tablet Commonly known as:  COMPAZINE Take 1 tablet (10 mg total) by mouth every 6 (six) hours as needed (Nausea or vomiting).   promethazine 25 MG tablet Commonly known as:  PHENERGAN TAKE 1 TABLET BY MOUTH  EVERY 6-8 HOURS AS NEEDED FOR NAUSEA   triamcinolone ointment 0.5 % Commonly known as:  KENALOG Apply 1 application topically 2 (two) times daily. Rash on bilateral upper extremities          Total Time in preparing paper work, data evaluation and todays exam - 35 minutes  Dustin Flock M.D on 02/10/2017 at 1:53 PM  [facility]   Office  [phone]

## 2017-02-13 ENCOUNTER — Inpatient Hospital Stay (HOSPITAL_BASED_OUTPATIENT_CLINIC_OR_DEPARTMENT_OTHER): Payer: Medicare HMO | Admitting: Oncology

## 2017-02-13 VITALS — BP 123/72 | HR 101 | Temp 98.9°F | Resp 18 | Wt 161.6 lb

## 2017-02-13 DIAGNOSIS — R197 Diarrhea, unspecified: Secondary | ICD-10-CM | POA: Diagnosis not present

## 2017-02-13 DIAGNOSIS — Z87891 Personal history of nicotine dependence: Secondary | ICD-10-CM | POA: Diagnosis not present

## 2017-02-13 DIAGNOSIS — I700 Atherosclerosis of aorta: Secondary | ICD-10-CM | POA: Diagnosis not present

## 2017-02-13 DIAGNOSIS — R531 Weakness: Secondary | ICD-10-CM | POA: Diagnosis not present

## 2017-02-13 DIAGNOSIS — E860 Dehydration: Secondary | ICD-10-CM | POA: Diagnosis not present

## 2017-02-13 DIAGNOSIS — R601 Generalized edema: Secondary | ICD-10-CM | POA: Diagnosis not present

## 2017-02-13 DIAGNOSIS — J449 Chronic obstructive pulmonary disease, unspecified: Secondary | ICD-10-CM | POA: Diagnosis not present

## 2017-02-13 DIAGNOSIS — Z79899 Other long term (current) drug therapy: Secondary | ICD-10-CM

## 2017-02-13 DIAGNOSIS — R110 Nausea: Secondary | ICD-10-CM | POA: Diagnosis not present

## 2017-02-13 DIAGNOSIS — M2550 Pain in unspecified joint: Secondary | ICD-10-CM | POA: Diagnosis not present

## 2017-02-13 DIAGNOSIS — F419 Anxiety disorder, unspecified: Secondary | ICD-10-CM | POA: Diagnosis not present

## 2017-02-13 DIAGNOSIS — Z171 Estrogen receptor negative status [ER-]: Secondary | ICD-10-CM | POA: Diagnosis not present

## 2017-02-13 DIAGNOSIS — E876 Hypokalemia: Secondary | ICD-10-CM

## 2017-02-13 DIAGNOSIS — R0602 Shortness of breath: Secondary | ICD-10-CM | POA: Diagnosis not present

## 2017-02-13 DIAGNOSIS — N179 Acute kidney failure, unspecified: Secondary | ICD-10-CM | POA: Diagnosis not present

## 2017-02-13 DIAGNOSIS — C50411 Malignant neoplasm of upper-outer quadrant of right female breast: Secondary | ICD-10-CM

## 2017-02-13 DIAGNOSIS — K219 Gastro-esophageal reflux disease without esophagitis: Secondary | ICD-10-CM | POA: Diagnosis not present

## 2017-02-13 DIAGNOSIS — K449 Diaphragmatic hernia without obstruction or gangrene: Secondary | ICD-10-CM | POA: Diagnosis not present

## 2017-02-13 DIAGNOSIS — Z5111 Encounter for antineoplastic chemotherapy: Secondary | ICD-10-CM | POA: Diagnosis present

## 2017-02-13 DIAGNOSIS — Z7689 Persons encountering health services in other specified circumstances: Secondary | ICD-10-CM | POA: Diagnosis not present

## 2017-02-13 DIAGNOSIS — I10 Essential (primary) hypertension: Secondary | ICD-10-CM | POA: Diagnosis not present

## 2017-02-13 NOTE — Progress Notes (Signed)
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  INTERVAL HISTORY: Patient returns to clinic today for hospital follow-up.She continues to have weakness and fatigue, but this has improved since discharge. She has no neurologic complaints. She denies any fevers. She has no chest pain, cough, or shortness of breath. She denies any nausea, vomiting, constipation, or diarrhea. She has no urinary complaints. Patient offers no further specific complaints.  REVIEW OF SYSTEMS:   Review of Systems  Constitutional: Positive for malaise/fatigue. Negative for fever and weight loss.  Respiratory: Negative for cough and shortness of breath.   Cardiovascular: Negative.  Negative for chest pain and leg swelling.  Gastrointestinal: Negative.  Negative for abdominal pain, diarrhea and nausea.  Genitourinary: Negative.   Musculoskeletal: Negative.  Negative for joint pain.  Skin: Negative.  Negative for rash.  Neurological: Positive for weakness. Negative for sensory change.  Psychiatric/Behavioral: Negative.  The patient is not nervous/anxious.     As per HPI. Otherwise, a complete review of systems is negative.  PAST MEDICAL HISTORY: Past Medical History:  Diagnosis Date  . Anxiety   . Arthritis   . Cancer [facility])    Right Breast Cancer  . COPD (chronic obstructive pulmonary disease) ([place])   . Dyspnea    with exertion  . GERD (gastroesophageal reflux disease)   . Hypertension     PAST SURGICAL HISTORY: Past Surgical History:  Procedure Laterality Date  . ABDOMINAL HYSTERECTOMY  1990   Partial  . BREAST BIOPSY Right 10/04/2016   axilla lymph node and axillay tail mass biopsy. Path  pending  . DILATION AND CURETTAGE OF UTERUS    . PORTACATH PLACEMENT Left 10/24/2016   Procedure: INSERTION PORT-A-CATH;  Surgeon: [name], MD;  Location: [facility] ORS;  Service: General;  Laterality: Left;    FAMILY HISTORY: Family History  Problem Relation Age of Onset  . Colon cancer Mother   . Breast cancer Neg Hx     ADVANCED DIRECTIVES (Y/N):  N  HEALTH MAINTENANCE: Social History  Substance Use Topics  . Smoking status: Former Smoker    Packs/day: 0.50    Types: Cigarettes    Quit date: 07/23/1988  . Smokeless tobacco: Never Used  . Alcohol use No     Colonoscopy:  PAP:  Bone density:  Lipid panel:  Allergies  Allergen Reactions  . Atorvastatin Other (See Comments)    Muscle Pain  . Gabapentin Swelling  . Penicillins Swelling    Current Outpatient Prescriptions  Medication Sig Dispense Refill  . ADVAIR DISKUS 500-50 MCG/DOSE AEPB Inhale 1 puff into the lungs 2 (two) times daily.     Marland Kitchen ALPRAZolam (XANAX) 0.25 MG tablet Take 1 tablet (0.25 mg total) by mouth at bedtime as needed for anxiety. 30 tablet 0  . baclofen (LIORESAL) 10 MG tablet Take 10 mg by mouth at bedtime.  9  . ciprofloxacin (CIPRO) 500 MG tablet Take 1 tablet (500 mg total) by mouth 2 (two) times daily. 8 tablet 0  . co-enzyme Q-10 30 MG capsule Take 30 mg by mouth daily.    . diphenoxylate-atropine (LOMOTIL) 2.5-0.025 MG tablet Take 1 tablet by mouth 4 (four) times daily as needed for diarrhea or loose stools. 30 tablet 1  .  DULoxetine (CYMBALTA) 60 MG capsule Take 1 capsule (60 mg total) by mouth daily. 30 capsule 0  . lidocaine-prilocaine (EMLA) cream Apply to affected area once 30 g 3  . losartan-hydrochlorothiazide (HYZAAR) 100-25 MG tablet Take 1 tablet by mouth daily.     . montelukast (SINGULAIR) 10 MG tablet Take 10 mg by mouth daily.     . Omega-3 Fatty Acids (FISH OIL PO) Take 1 capsule by mouth daily.     Marland Kitchen omeprazole (PRILOSEC) 20 MG capsule Take 20 mg by mouth daily.    .  ondansetron (ZOFRAN) 8 MG tablet Take 1 tablet (8 mg total) by mouth 2 (two) times daily as needed for refractory nausea / vomiting. 60 tablet 2  . oxyCODONE-acetaminophen (ROXICET) 5-325 MG tablet Take 1 tablet by mouth every 6 (six) hours as needed. 30 tablet 0  . Potassium Chloride ER 20 MEQ TBCR Take 1 tablet by mouth daily. 60 tablet 2  . potassium chloride SA (K-DUR,KLOR-CON) 20 MEQ tablet Take 2 tablets (40 mEq total) by mouth daily. This is in addition to ur regular dose of 48mq daily 6 tablet 0  . prochlorperazine (COMPAZINE) 10 MG tablet Take 1 tablet (10 mg total) by mouth every 6 (six) hours as needed (Nausea or vomiting). 60 tablet 2  . promethazine (PHENERGAN) 25 MG tablet TAKE 1 TABLET BY MOUTH EVERY 6-8 HOURS AS NEEDED FOR NAUSEA  2  . triamcinolone ointment (KENALOG) 0.5 % Apply 1 application topically 2 (two) times daily. Rash on bilateral upper extremities 30 g 0   No current facility-administered medications for this visit.    Facility-Administered Medications Ordered in Other Visits  Medication Dose Route Frequency Provider Last Rate Last Dose  . 0.9 %  sodium chloride infusion   Intravenous Once FLloyd Huger MD      . 0.9 %  sodium chloride infusion   Intravenous Once FLloyd Huger MD      . 0.9 %  sodium chloride infusion   Intravenous Once FLloyd Huger MD      . 0.9 %  sodium chloride infusion   Intravenous Continuous FGrayland Ormond TKathlene November MD      . ondansetron (ZOFRAN) 8 mg in sodium chloride 0.9 % 50 mL IVPB   Intravenous Once FLloyd Huger MD        OBJECTIVE: Vitals:   02/13/17 1432  BP: 123/72  Pulse: (!) 101  Resp: 18  Temp: 98.9 F (37.2 C)     Body mass index is 28.63 kg/m.    ECOG FS:0 - Asymptomatic  General: Well-developed, well-nourished, no acute distress. Eyes: Pink conjunctiva, anicteric sclera. Breasts: Easily palpable right breast mass. Lungs: Clear to auscultation bilaterally. Heart: Regular rate and rhythm. No  rubs, murmurs, or gallops. Abdomen: Soft, nontender, nondistended. No organomegaly noted, normoactive bowel sounds. Musculoskeletal: Non-pitting edema in BLE. No cyanosis, or clubbing. Neuro: Alert, answering all questions appropriately. Cranial nerves grossly intact. Skin: No rashes or petechiae noted. Psych: Normal affect.   LAB RESULTS:  Lab Results  Component Value Date   NA 134 (L) 02/10/2017   K 3.2 (L) 02/10/2017   CL 108 02/10/2017   CO2 20 (L) 02/10/2017   GLUCOSE 106 (H) 02/10/2017   BUN 18 02/10/2017   CREATININE 1.38 (H) 02/10/2017   CALCIUM 8.2 (L) 02/10/2017   PROT 6.7 02/07/2017   ALBUMIN 2.9 (L) 02/07/2017   AST 21 02/07/2017   ALT 16 02/07/2017   ALKPHOS 85 02/07/2017   BILITOT 0.7  02/07/2017   GFRNONAA 36 (L) 02/10/2017   GFRAA 42 (L) 02/10/2017    Lab Results  Component Value Date   WBC 8.9 02/10/2017   NEUTROABS 2.4 02/07/2017   HGB 9.5 (L) 02/10/2017   HCT 28.2 (L) 02/10/2017   MCV 95.9 02/10/2017   PLT 76 (L) 02/10/2017     STUDIES: Dg Chest 2 View  Result Date: 01/22/2017 CLINICAL DATA:  Chest pain and shortness of Breath EXAM: CHEST  2 VIEW COMPARISON:  10/24/2016 FINDINGS: Cardiac shadow is within normal limits. Left chest wall port is seen in satisfactory position. Emphysematous changes are noted in both lungs in the upper lobes but stable. No focal infiltrate or sizable effusion is seen. Mild degenerative changes of thoracic spine are noted. IMPRESSION: COPD without acute abnormality. The overall appearance is stable from the prior study. Electronically Signed   By: Inez Catalina M.D.   On: 01/22/2017 16:06   Ct Angio Chest Pe W And/or Wo Contrast  Result Date: 01/22/2017 CLINICAL DATA:  Right-sided breast cancer with chest pain and shortness of breath EXAM: CT ANGIOGRAPHY CHEST WITH CONTRAST TECHNIQUE: Multidetector CT imaging of the chest was performed using the standard protocol during bolus administration of intravenous contrast.  Multiplanar CT image reconstructions and MIPs were obtained to evaluate the vascular anatomy. CONTRAST:  75 mL Isovue 370 intravenous COMPARISON:  Radiographs 01/22/2017, CT chest 04/08/2014, 04/09/2014 FINDINGS: Cardiovascular: Satisfactory opacification of the pulmonary arteries to the segmental level. No evidence of pulmonary embolism. Aortic atherosclerosis. No dissection is seen. Left-sided central venous catheter tip terminates in the proximal right atrium. Borderline to mild cardiomegaly. Coronary artery calcification. No pericardial effusion Mediastinum/Nodes: Midline trachea. No thyroid mass. Mild distension and debris filled esophagus. Nonspecific subcentimeter mediastinal lymph nodes. Subcentimeter right axillary lymph nodes. Moderate to large hiatal hernia. Lungs/Pleura: Moderate severe emphysema. No consolidation, pleural effusion or pneumothorax Upper Abdomen: Stable low-density lesion in the upper pole of right kidney, intermediate density values but no change in size since 2015, favoring benign process. No acute abnormality. Musculoskeletal: No definite acute or suspicious bone lesion is seen. Review of the MIP images confirms the above findings. IMPRESSION: 1. Negative for acute pulmonary embolus or aortic dissection 2. Moderate severe emphysema without consolidation or effusion 3. Moderate hiatal hernia with diffuse esophageal distension and debris 4. Stable 2.6 cm low-density lesion in the upper pole right kidney, favor benign process given lack of interval growth since 2015 Aortic Atherosclerosis (ICD10-I70.0) and Emphysema (ICD10-J43.9). Electronically Signed   By: Donavan Foil M.D.   On: 01/22/2017 19:46   Nm Cardiac Muga Rest  Result Date: 01/22/2017 CLINICAL DATA:  Breast cancer, undergoing chemotherapy EXAM: NUCLEAR MEDICINE CARDIAC BLOOD POOL IMAGING (MUGA) TECHNIQUE: Cardiac multi-gated acquisition was performed at rest following intravenous injection of Tc-55mlabeled red blood  cells. RADIOPHARMACEUTICALS:  21.5 mCi Tc-936mDP in-vitro labeled red blood cells IV COMPARISON:  10/20/2016 FINDINGS: The cine images demonstrate normal left ventricular wall motion. No akinetic or dyskinetic segments are identified. The ejection fraction was calculated at 68%. This is stable when compared to prior examination. IMPRESSION: Normal left ventricular wall motion with left ventricular ejection fraction of 68% (unchanged since prior study). Electronically Signed   By: P.Marijo Sanes.D.   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  PLAN:    1. Clinical stage IIB ER negative, PR and HER-2 positive invasive carcinoma of the right  upper outer quadrant breast: Given patient's decreased performance status and difficulty with her recent chemotherapy, we will discontinue neoadjuvant chemotherapy using Taxotere, carboplatinum, Herceptin, and Perjeta. Patient received cycle 5 of 6 on January 30, 2017. MUGA scan from January 19, 2017 revealed an EF of 68% which is unchanged from previous. Patient will receive cycle 6 of treatment on February 20, 2017 which will include Herceptin and Perjeta only. She will then follow-up every 3 weeks with maintenance Herceptin for a total of 18 infusions. Patient was also given a referral to surgery today. She will eventually require adjuvant XRT if she pursues lumpectomy. Finally, given the PR positivity of her tumor she will benefit from an aromatase inhibitor for 5 years.  2. Acute renal failure: Nearly resolved. Monitor. 3. Nausea: Continue current antiemetics as ordered. 4. Hypokalemia: Improved. Continue oral potassium supplementation as prescribed. 5. Hypomagnesemia: Monitor.  Patient expressed understanding and was in agreement with this plan. She also understands that She can call clinic at any time with any questions, concerns, or complaints.    Cancer Staging Malignant neoplasm of upper outer quadrant of female breast [facility]) Staging form: Breast, AJCC 8th Edition - Clinical stage from 10/16/2016: Stage IIB (cT2, cN1, cM0, G3, ER: Negative, PR: Positive, HER2: Positive) - Signed by Lloyd Huger, MD on 10/16/2016   Lloyd Huger, MD   02/13/2017 2:45 PM

## 2017-02-14 ENCOUNTER — Telehealth: Payer: Self-pay | Admitting: *Deleted

## 2017-02-14 MED ORDER — OXYCODONE-ACETAMINOPHEN 5-325 MG PO TABS: 1.0000 | ORAL_TABLET | Freq: Four times a day (QID) | ORAL | 0 refills | Status: DC | PRN

## 2017-02-14 NOTE — Telephone Encounter (Signed)
Daughter Helene Kelp called and states that her mothers hands have "flared up again" and she is requesting a refill of Oxycodone. She states "I know we were just there yesterday" Reports that she will not have enough medicine to last the weekend. Please advise

## 2017-02-14 NOTE — Telephone Encounter (Signed)
Yes she can have a refill.

## 2017-02-16 NOTE — Progress Notes (Signed)
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  INTERVAL HISTORY: Patient returns to clinic today for further evaluation and continuation of Herceptin and Perjeta.  She feels significantly improved, but not back to her baseline.  She continues to have mild weakness and fatigue. She has no neurologic complaints. She denies any fevers. She has no chest pain, cough, or shortness of breath. She denies any nausea, vomiting, constipation, or diarrhea. She has no urinary complaints. Patient offers no further specific complaints.  REVIEW OF SYSTEMS:   Review of Systems  Constitutional: Positive for malaise/fatigue. Negative for fever and weight loss.  Respiratory: Negative for cough and shortness of breath.   Cardiovascular: Negative.  Negative for chest pain and leg swelling.  Gastrointestinal: Negative.  Negative for abdominal pain, diarrhea and nausea.  Genitourinary: Negative.   Musculoskeletal: Negative.  Negative for joint pain.  Skin: Negative.  Negative for rash.  Neurological: Positive for weakness. Negative for sensory change.  Psychiatric/Behavioral: Negative.  The patient is not nervous/anxious.     As per HPI. Otherwise, a complete review of systems is negative.  PAST MEDICAL HISTORY: Past Medical History:  Diagnosis Date  . Anxiety   . Arthritis   . Cancer [facility])    Right Breast Cancer  . COPD (chronic obstructive pulmonary disease) ([place])   . Dyspnea    with exertion  . GERD (gastroesophageal reflux disease)   . Hypertension     PAST SURGICAL HISTORY: Past Surgical History:  Procedure Laterality Date   . ABDOMINAL HYSTERECTOMY  1990   Partial  . BREAST BIOPSY Right 10/04/2016   axilla lymph node and axillay tail mass biopsy. Path pending  . DILATION AND CURETTAGE OF UTERUS    . PORTACATH PLACEMENT Left 10/24/2016   Procedure: INSERTION PORT-A-CATH;  Surgeon: [name], MD;  Location: [facility] ORS;  Service: General;  Laterality: Left;    FAMILY HISTORY: Family History  Problem Relation Age of Onset  . Colon cancer Mother   . Breast cancer Neg Hx     ADVANCED DIRECTIVES (Y/N):  N  HEALTH MAINTENANCE: Social History  Substance Use Topics  . Smoking status: Former Smoker    Packs/day: 0.50    Types: Cigarettes    Quit date: 07/23/1988  . Smokeless tobacco: Never Used  . Alcohol use No     Colonoscopy:  PAP:  Bone density:  Lipid panel:  Allergies  Allergen Reactions  . Atorvastatin Other (See Comments)    Muscle Pain  . Gabapentin Swelling  . Penicillins Swelling    Current Outpatient Prescriptions  Medication Sig Dispense Refill  . ADVAIR DISKUS 500-50 MCG/DOSE AEPB Inhale 1 puff into the lungs 2 (two) times daily.     Marland Kitchen ALPRAZolam (XANAX) 0.25 MG tablet Take 1 tablet (0.25 mg total) by mouth at bedtime as needed for anxiety. 30 tablet 0  . baclofen (LIORESAL) 10 MG tablet Take 10 mg by mouth at bedtime.  9  . co-enzyme Q-10 30 MG capsule Take 30 mg by mouth daily.    . diphenoxylate-atropine (LOMOTIL) 2.5-0.025 MG tablet Take 1 tablet by mouth 4 (four) times daily as needed for diarrhea or loose stools. 30 tablet 1  .  DULoxetine (CYMBALTA) 60 MG capsule Take 1 capsule (60 mg total) by mouth daily. 30 capsule 0  . losartan-hydrochlorothiazide (HYZAAR) 100-25 MG tablet Take 1 tablet by mouth daily.     . montelukast (SINGULAIR) 10 MG tablet Take 10 mg by mouth daily.     . Omega-3 Fatty Acids (FISH OIL PO) Take 1 capsule by mouth daily.     Marland Kitchen omeprazole (PRILOSEC) 20 MG capsule Take 20 mg by mouth daily.    Marland Kitchen oxyCODONE-acetaminophen (ROXICET) 5-325 MG tablet Take 1  tablet by mouth every 6 (six) hours as needed. 30 tablet 0  . Potassium Chloride ER 20 MEQ TBCR Take 1 tablet by mouth daily. 60 tablet 2  . promethazine (PHENERGAN) 25 MG tablet TAKE 1 TABLET BY MOUTH EVERY 6-8 HOURS AS NEEDED FOR NAUSEA  2  . triamcinolone ointment (KENALOG) 0.5 % Apply 1 application topically 2 (two) times daily. Rash on bilateral upper extremities 30 g 0  . potassium chloride SA (K-DUR,KLOR-CON) 20 MEQ tablet Take 2 tablets (40 mEq total) by mouth daily. This is in addition to ur regular dose of daily 6 tablet 0   No current facility-administered medications for this visit.    Facility-Administered Medications Ordered in Other Visits  Medication Dose Route Frequency Provider Last Rate Last Dose  . 0.9 %  sodium chloride infusion   Intravenous Once Jeralyn Ruths, MD      . 0.9 %  sodium chloride infusion   Intravenous Once Jeralyn Ruths, MD      . 0.9 %  sodium chloride infusion   Intravenous Once Jeralyn Ruths, MD      . 0.9 %  sodium chloride infusion   Intravenous Continuous Orlie Dakin, Tollie Pizza, MD      . 0.9 %  sodium chloride infusion   Intravenous Once Jeralyn Ruths, MD      . acetaminophen (TYLENOL) tablet 650 mg  650 mg Oral Once Jeralyn Ruths, MD      . diphenhydrAMINE (BENADRYL) capsule 25 mg  25 mg Oral Once Jeralyn Ruths, MD      . heparin lock flush 100 unit/mL  500 Units Intravenous Once Jeralyn Ruths, MD      . magnesium sulfate IVPB 4 g 100 mL  4 g Intravenous Once Jeralyn Ruths, MD 50 mL/hr at 02/20/17 1025 4 g at 02/20/17 1025  . ondansetron (ZOFRAN) 8 mg in sodium chloride 0.9 % 50 mL IVPB   Intravenous Once Jeralyn Ruths, MD      . pertuzumab (PERJETA) 420 mg in sodium chloride 0.9 % 250 mL chemo infusion  420 mg Intravenous Once Jeralyn Ruths, MD      . sodium chloride flush (NS) 0.9 % injection 10 mL  10 mL Intravenous PRN Jeralyn Ruths, MD   10 mL at 02/20/17 0936  . trastuzumab  (HERCEPTIN) 450 mg in sodium chloride 0.9 % 250 mL chemo infusion  450 mg Intravenous Once Jeralyn Ruths, MD        OBJECTIVE: Vitals:   02/20/17 0950  BP: 115/68  Pulse: 85  Temp: (!) 97.2 F (36.2 C)     Body mass index is 29.05 kg/m.    ECOG FS:0 - Asymptomatic  General: Well-developed, well-nourished, no acute distress. Eyes: Pink conjunctiva, anicteric sclera. Breasts: Easily palpable right breast mass. Lungs: Clear to auscultation bilaterally. Heart: Regular rate and rhythm. No rubs, murmurs, or gallops. Abdomen: Soft, nontender, nondistended. No organomegaly  noted, normoactive bowel sounds. Musculoskeletal: Non-pitting edema in BLE. No cyanosis, or clubbing. Neuro: Alert, answering all questions appropriately. Cranial nerves grossly intact. Skin: No rashes or petechiae noted. Psych: Normal affect.   LAB RESULTS:  Lab Results  Component Value Date   NA 137 02/20/2017   K 3.7 02/20/2017   CL 104 02/20/2017   CO2 23 02/20/2017   GLUCOSE 114 (H) 02/20/2017   BUN 14 02/20/2017   CREATININE 1.26 (H) 02/20/2017   CALCIUM 8.5 (L) 02/20/2017   PROT 6.8 02/20/2017   ALBUMIN 3.0 (L) 02/20/2017   AST 25 02/20/2017   ALT 12 (L) 02/20/2017   ALKPHOS 74 02/20/2017   BILITOT 0.4 02/20/2017   GFRNONAA 41 (L) 02/20/2017   GFRAA 47 (L) 02/20/2017    Lab Results  Component Value Date   WBC 5.9 02/20/2017   NEUTROABS 2.7 02/20/2017   HGB 9.5 (L) 02/20/2017   HCT 28.6 (L) 02/20/2017   MCV 98.7 02/20/2017   PLT 369 02/20/2017     STUDIES: Dg Chest 2 View  Result Date: 01/22/2017 CLINICAL DATA:  Chest pain and shortness of Breath EXAM: CHEST  2 VIEW COMPARISON:  10/24/2016 FINDINGS: Cardiac shadow is within normal limits. Left chest wall port is seen in satisfactory position. Emphysematous changes are noted in both lungs in the upper lobes but stable. No focal infiltrate or sizable effusion is seen. Mild degenerative changes of thoracic spine are noted. IMPRESSION:  COPD without acute abnormality. The overall appearance is stable from the prior study. Electronically Signed   By: Alcide Clever M.D.   On: 01/22/2017 16:06   Ct Angio Chest Pe W And/or Wo Contrast  Result Date: 01/22/2017 CLINICAL DATA:  Right-sided breast cancer with chest pain and shortness of breath EXAM: CT ANGIOGRAPHY CHEST WITH CONTRAST TECHNIQUE: Multidetector CT imaging of the chest was performed using the standard protocol during bolus administration of intravenous contrast. Multiplanar CT image reconstructions and MIPs were obtained to evaluate the vascular anatomy. CONTRAST:  75 mL Isovue 370 intravenous COMPARISON:  Radiographs 01/22/2017, CT chest 04/08/2014, 04/09/2014 FINDINGS: Cardiovascular: Satisfactory opacification of the pulmonary arteries to the segmental level. No evidence of pulmonary embolism. Aortic atherosclerosis. No dissection is seen. Left-sided central venous catheter tip terminates in the proximal right atrium. Borderline to mild cardiomegaly. Coronary artery calcification. No pericardial effusion Mediastinum/Nodes: Midline trachea. No thyroid mass. Mild distension and debris filled esophagus. Nonspecific subcentimeter mediastinal lymph nodes. Subcentimeter right axillary lymph nodes. Moderate to large hiatal hernia. Lungs/Pleura: Moderate severe emphysema. No consolidation, pleural effusion or pneumothorax Upper Abdomen: Stable low-density lesion in the upper pole of right kidney, intermediate density values but no change in size since 2015, favoring benign process. No acute abnormality. Musculoskeletal: No definite acute or suspicious bone lesion is seen. Review of the MIP images confirms the above findings. IMPRESSION: 1. Negative for acute pulmonary embolus or aortic dissection 2. Moderate severe emphysema without consolidation or effusion 3. Moderate hiatal hernia with diffuse esophageal distension and debris 4. Stable 2.6 cm low-density lesion in the upper pole right kidney,  favor benign process given lack of interval growth since 2015 Aortic Atherosclerosis (ICD10-I70.0) and Emphysema (ICD10-J43.9). Electronically Signed   By: Jasmine Pang M.D.   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  PLAN:    1. 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 Patient received cycle 5 of 6 on January 30, 2017. MUGA scan from January 19, 2017 revealed an EF of 68% which is unchanged from previous.  Proceed with cycle 6 of treatment today which will include Herceptin and Perjeta only.  She was previously given a referral to surgery and has an appointment on February 28, 2017. She will eventually require adjuvant XRT if she pursues lumpectomy. Finally, given the PR positivity of her tumor she will benefit from an aromatase inhibitor for 5 years.  Return to clinic in 3 weeks for reevaluation and consideration of cycle 7 of 18 of Herceptin only.   2. Acute renal failure: Nearly resolved. Monitor. 3. Nausea: Continue current antiemetics as ordered. 4. Hypokalemia: Improved. Continue oral potassium supplementation as prescribed. 5. Hypomagnesemia: Patient will receive 4 g IV magnesium today.  She has also been instructed to initiate oral supplementation.  Patient expressed understanding and was in agreement with this plan. She also understands that She can call clinic at any time with any questions, concerns, or complaints.   Cancer Staging Malignant neoplasm of upper outer quadrant of female breast [facility]) Staging form: Breast, AJCC 8th Edition - Clinical stage from 10/16/2016: Stage IIB (cT2, cN1, cM0, G3, ER: Negative, PR:  Positive, HER2: Positive) - Signed by Lloyd Huger, MD on 10/16/2016   Lloyd Huger, MD   02/20/2017 11:06 AM

## 2017-02-19 ENCOUNTER — Other Ambulatory Visit: Payer: Self-pay | Admitting: *Deleted

## 2017-02-19 ENCOUNTER — Other Ambulatory Visit: Payer: Self-pay | Admitting: Oncology

## 2017-02-19 DIAGNOSIS — C50919 Malignant neoplasm of unspecified site of unspecified female breast: Secondary | ICD-10-CM

## 2017-02-20 ENCOUNTER — Inpatient Hospital Stay: Payer: Medicare HMO

## 2017-02-20 ENCOUNTER — Inpatient Hospital Stay (HOSPITAL_BASED_OUTPATIENT_CLINIC_OR_DEPARTMENT_OTHER): Payer: Medicare HMO | Admitting: Oncology

## 2017-02-20 VITALS — BP 115/68 | HR 85 | Temp 97.2°F | Wt 164.0 lb

## 2017-02-20 DIAGNOSIS — R110 Nausea: Secondary | ICD-10-CM | POA: Diagnosis not present

## 2017-02-20 DIAGNOSIS — Z5111 Encounter for antineoplastic chemotherapy: Secondary | ICD-10-CM | POA: Diagnosis not present

## 2017-02-20 DIAGNOSIS — C50411 Malignant neoplasm of upper-outer quadrant of right female breast: Secondary | ICD-10-CM

## 2017-02-20 DIAGNOSIS — E876 Hypokalemia: Secondary | ICD-10-CM | POA: Diagnosis not present

## 2017-02-20 DIAGNOSIS — Z171 Estrogen receptor negative status [ER-]: Secondary | ICD-10-CM | POA: Diagnosis not present

## 2017-02-20 DIAGNOSIS — Z79899 Other long term (current) drug therapy: Secondary | ICD-10-CM | POA: Diagnosis not present

## 2017-02-20 DIAGNOSIS — C50919 Malignant neoplasm of unspecified site of unspecified female breast: Secondary | ICD-10-CM

## 2017-02-20 DIAGNOSIS — N179 Acute kidney failure, unspecified: Secondary | ICD-10-CM | POA: Diagnosis not present

## 2017-02-20 LAB — CBC WITH DIFFERENTIAL/PLATELET
BASOS PCT: 1 %
Basophils Absolute: 0.1 10*3/uL (ref 0–0.1)
EOS ABS: 0 10*3/uL (ref 0–0.7)
Eosinophils Relative: 0 %
HEMATOCRIT: 28.6 % — AB (ref 35.0–47.0)
Hemoglobin: 9.5 g/dL — ABNORMAL LOW (ref 12.0–16.0)
LYMPHS ABS: 2.1 10*3/uL (ref 1.0–3.6)
Lymphocytes Relative: 36 %
MCH: 32.7 pg (ref 26.0–34.0)
MCHC: 33.2 g/dL (ref 32.0–36.0)
MCV: 98.7 fL (ref 80.0–100.0)
MONO ABS: 1 10*3/uL — AB (ref 0.2–0.9)
MONOS PCT: 16 %
Neutro Abs: 2.7 10*3/uL (ref 1.4–6.5)
Neutrophils Relative %: 47 %
Platelets: 369 10*3/uL (ref 150–440)
RBC: 2.9 MIL/uL — ABNORMAL LOW (ref 3.80–5.20)
RDW: 19.3 % — AB (ref 11.5–14.5)
WBC: 5.9 10*3/uL (ref 3.6–11.0)

## 2017-02-20 LAB — SAMPLE TO BLOOD BANK

## 2017-02-20 LAB — COMPREHENSIVE METABOLIC PANEL
ALBUMIN: 3 g/dL — AB (ref 3.5–5.0)
ALK PHOS: 74 U/L (ref 38–126)
ALT: 12 U/L — AB (ref 14–54)
AST: 25 U/L (ref 15–41)
Anion gap: 10 (ref 5–15)
BUN: 14 mg/dL (ref 6–20)
CALCIUM: 8.5 mg/dL — AB (ref 8.9–10.3)
CHLORIDE: 104 mmol/L (ref 101–111)
CO2: 23 mmol/L (ref 22–32)
CREATININE: 1.26 mg/dL — AB (ref 0.44–1.00)
GFR calc Af Amer: 47 mL/min — ABNORMAL LOW (ref >60)
GFR calc non Af Amer: 41 mL/min — ABNORMAL LOW (ref >60)
GLUCOSE: 114 mg/dL — AB (ref 65–99)
Potassium: 3.7 mmol/L (ref 3.5–5.1)
SODIUM: 137 mmol/L (ref 135–145)
Total Bilirubin: 0.4 mg/dL (ref 0.3–1.2)
Total Protein: 6.8 g/dL (ref 6.5–8.1)

## 2017-02-20 LAB — MAGNESIUM: Magnesium: 1.1 mg/dL — ABNORMAL LOW (ref 1.7–2.4)

## 2017-02-20 MED ORDER — MAGNESIUM SULFATE 4 GM/100ML IV SOLN
4.0000 g | Freq: Once | INTRAVENOUS | Status: AC
  Administered 2017-02-20: 4 g via INTRAVENOUS
  Filled 2017-02-20: qty 100

## 2017-02-20 MED ORDER — SODIUM CHLORIDE 0.9 % IV SOLN
Freq: Once | INTRAVENOUS | Status: AC
  Administered 2017-02-20: 10:00:00 via INTRAVENOUS
  Filled 2017-02-20: qty 1000

## 2017-02-20 MED ORDER — ACETAMINOPHEN 325 MG PO TABS
650.0000 mg | ORAL_TABLET | Freq: Once | ORAL | Status: AC
  Administered 2017-02-20: 650 mg via ORAL
  Filled 2017-02-20: qty 2

## 2017-02-20 MED ORDER — DIPHENHYDRAMINE HCL 25 MG PO CAPS
25.0000 mg | ORAL_CAPSULE | Freq: Once | ORAL | Status: AC
  Administered 2017-02-20: 25 mg via ORAL
  Filled 2017-02-20: qty 1

## 2017-02-20 MED ORDER — SODIUM CHLORIDE 0.9 % IV SOLN
420.0000 mg | Freq: Once | INTRAVENOUS | Status: AC
  Administered 2017-02-20: 420 mg via INTRAVENOUS
  Filled 2017-02-20: qty 14

## 2017-02-20 MED ORDER — HEPARIN SOD (PORK) LOCK FLUSH 100 UNIT/ML IV SOLN
500.0000 [IU] | Freq: Once | INTRAVENOUS | Status: AC
  Administered 2017-02-20: 500 [IU] via INTRAVENOUS
  Filled 2017-02-20: qty 5

## 2017-02-20 MED ORDER — MAGNESIUM SULFATE 50 % IJ SOLN: 4.0000 g | Freq: Once | INTRAMUSCULAR | Status: DC

## 2017-02-20 MED ORDER — TRASTUZUMAB CHEMO 150 MG IV SOLR
450.0000 mg | Freq: Once | INTRAVENOUS | Status: AC
  Administered 2017-02-20: 450 mg via INTRAVENOUS
  Filled 2017-02-20: qty 21.43

## 2017-02-20 MED ORDER — SODIUM CHLORIDE 0.9% FLUSH
10.0000 mL | INTRAVENOUS | Status: DC | PRN
  Administered 2017-02-20: 10 mL via INTRAVENOUS
  Filled 2017-02-20: qty 10

## 2017-02-20 NOTE — Progress Notes (Signed)
Patient here today for follow up.  Patient states no new concerns today  

## 2017-02-28 ENCOUNTER — Other Ambulatory Visit: Payer: Self-pay

## 2017-02-28 ENCOUNTER — Other Ambulatory Visit: Payer: Self-pay | Admitting: Internal Medicine

## 2017-02-28 ENCOUNTER — Ambulatory Visit (INDEPENDENT_AMBULATORY_CARE_PROVIDER_SITE_OTHER): Payer: Medicare HMO | Admitting: General Surgery

## 2017-02-28 ENCOUNTER — Telehealth: Payer: Self-pay | Admitting: *Deleted

## 2017-02-28 ENCOUNTER — Encounter: Payer: Self-pay | Admitting: General Surgery

## 2017-02-28 VITALS — BP 129/78 | HR 99 | Temp 97.7°F | Ht 65.0 in | Wt 163.0 lb

## 2017-02-28 DIAGNOSIS — Z171 Estrogen receptor negative status [ER-]: Secondary | ICD-10-CM

## 2017-02-28 DIAGNOSIS — C50411 Malignant neoplasm of upper-outer quadrant of right female breast: Secondary | ICD-10-CM | POA: Diagnosis not present

## 2017-02-28 NOTE — Telephone Encounter (Signed)
An appointment prior to her vacation is fine by me.

## 2017-02-28 NOTE — Patient Instructions (Signed)
We have spoken today about removing a lump in your breast. This will be done on 03/21/2017 by Dr. Clayburn Pert at [facility].  You will most likely be able to leave the hospital several hours after your surgery. Rarely, a patient needs to stay over night but this is a possibility.  Plan to tenatively be off work for 1-2 weeks following the surgery and may return with approximately 4 more weeks of a lifting restriction, no greater than 15 lbs.    Lumpectomy A lumpectomy is a form of "breast conserving" or "breast preservation" surgery. It may also be referred to as a partial mastectomy. During a lumpectomy, the portion of the breast that contains the cancerous tumor or breast mass (the lump) is removed. Some normal tissue around the lump may also be removed to make sure all of the tumor has been removed.  LET [facility] CARE PROVIDER KNOW ABOUT:  Any allergies you have.  All medicines you are taking, including vitamins, herbs, eye drops, creams, and over-the-counter medicines.  Previous problems you or members of your family have had with the use of anesthetics.  Any blood disorders you have.  Previous surgeries you have had.  Medical conditions you have. RISKS AND COMPLICATIONS Generally, this is a safe procedure. However, problems can occur and include:  Bleeding.  Infection.  Pain.  Temporary swelling.  Change in the shape of the breast, particularly if a large portion is removed. BEFORE THE PROCEDURE  Ask your health care provider about changing or stopping your regular medicines. This is especially important if you are taking diabetes medicines or blood thinners.  Do not eat or drink anything after midnight on the night before the procedure or as directed by your health care provider. Ask your health care provider if you can take a sip of water with any approved medicines.  On the day of surgery, your health care provider will use a mammogram or ultrasound to locate and  mark the tumor in your breast. These markings on your breast will show where the cut (incision) will be made. PROCEDURE   An IV tube will be put into one of your veins.  You may be given medicine to help you relax before the surgery (sedative). You will be given one of the following:  A medicine that numbs the area (local anesthetic).  A medicine that makes you fall asleep (general anesthetic).  Your health care provider will use a kind of electric scalpel that uses heat to minimize bleeding (electrocautery knife).  A curved incision (like a smile or frown) that follows the natural curve of your breast is made, to allow for minimal scarring and better healing.  The tumor will be removed with some of the surrounding tissue. This will be sent to the lab for analysis. Your health care provider may also remove your lymph nodes at this time if needed.  Sometimes, but not always, a rubber tube called a drain will be surgically inserted into your breast area or armpit to collect excess fluid that may accumulate in the space where the tumor was. This drain is connected to a plastic bulb on the outside of your body. This drain creates suction to help remove the fluid.  The incisions will be closed with stitches (sutures).  A bandage may be placed over the incisions. AFTER THE PROCEDURE  You will be taken to the recovery area.  You will be given medicine for pain.  A small rubber drain may be placed in  the breast for 2-3 days to prevent a collection of blood (hematoma) from developing in the breast. You will be given instructions on caring for the drain before you go home.  A pressure bandage (dressing) will be applied for 1-2 days to prevent bleeding. Ask your health care provider how to care for your bandage at home.   This information is not intended to replace advice given to you by your health care provider. Make sure you discuss any questions you have with your health care provider.     Document Released: 05/22/2006 Document Revised: 05/01/2014 Document Reviewed: 09/13/2012 Elsevier Interactive Patient Education 2016 Elsevier Inc.    Total or Modified Radical Mastectomy A total mastectomy and a modified radical mastectomy are types of surgery for breast cancer. If you are having a total mastectomy (simple mastectomy), your entire breast will be removed. If you are having a modified radical mastectomy, your breast and nipple will be removed along with the lymph nodes under your arm. You may also have some of the lining over the muscle tissues under your breast removed. LET [facility] CARE PROVIDER KNOW ABOUT:  Any allergies you have.  All medicines you are taking, including vitamins, herbs, eye drops, creams, and over-the-counter medicines.  Previous problems you or members of your family have had with the use of anesthetics.  Any blood disorders you have.  Previous surgeries you have had.  Medical conditions you have. RISKS AND COMPLICATIONS Generally, this is a safe procedure. However, problems may occur, including:  Pain.  Infection.  Bleeding.  Scar tissue.  Chest numbness on the side of the surgery.  Fluid buildup under the skin flaps where your breast was removed (seroma).  Sensation of throbbing or tingling.  Stress or sadness from losing your breast. If you have the lymph nodes under your arm removed, you may have arm swelling, weakness, or numbness on the same side of your body as your surgery. BEFORE THE PROCEDURE  Ask your health care provider about:  Changing or stopping your regular medicines. This is especially important if you are taking diabetes medicines or blood thinners.  Taking medicines such as aspirin and ibuprofen. These medicines can thin your blood. Do not take these medicines before your procedure if your health care provider instructs you not to.  Follow your health care provider's instructions about eating or drinking  restrictions.  Plan to have someone take you home after the procedure. PROCEDURE  An IV tube will be inserted into one of your veins.  You will be given a medicine that makes you fall asleep (general anesthetic).  Your breast will be cleaned with a germ-killing solution (antiseptic).  A wide incision will be made around your nipple. The skin and nipple inside the incision will be removed along with all breast tissue.  If you are having a modified radical mastectomy:  The lining over your chest muscles will be removed.  The incision may be extended to reach the lymph nodes under your arm, or a second incision may be made.  The lymph nodes will be removed.  You may have a drainage tube inserted into your incision to collect fluid that builds up after surgery. This tube is connected to a suction bulb.  Your incision or incisions will be closed with stitches (sutures).  A bandage (dressing) will be placed over your breast and under your arm. The procedure may vary among health care providers and hospitals. AFTER THE PROCEDURE  You will be moved to a  recovery area.  Your blood pressure, heart rate, breathing rate, and blood oxygen level will be monitored often until the medicines you were given have worn off.  You will be given pain medicine as needed.  After a while, you will be taken to a hospital room.  You will be encouraged to get up and walk as soon as you can.  Your IV tube can be removed when you are able to eat and drink.  Your drain may be removed before you go home from the hospital, or you may be sent home with your drain and suction bulb.   This information is not intended to replace advice given to you by your health care provider. Make sure you discuss any questions you have with your health care provider.   Document Released: 01/03/2001 Document Revised: 05/01/2014 Document Reviewed: 12/24/2013 Elsevier Interactive Patient Education [employer].

## 2017-02-28 NOTE — H&P (View-Only) (Signed)
Outpatient Surgical Follow Up  02/28/2017  Krista Cisneros is an 76 y.o. female.   Chief Complaint  Patient presents with  . New Patient (Initial Visit)    Malignant Neoplasm of Right Breast    HPI: 76 year old female returns to clinic to discuss her breast cancer treatment.  Patient was seen earlier this summer by my partner Dr. Genevive Bi when her Chemo-Port was placed for her neoadjuvant chemotherapy.  Patient was diagnosed with breast cancer earlier this year and she is completed the neoadjuvant therapy that she was going to receive.  She continues to perform self breast exams and states that she never could feel the area that was biopsy-proven positive.  She recently had a hospitalization secondary to renal insufficiency and fear of a pulmonary embolism.  She reports that she is still slowly regaining her strength but continues to have weakness associated with that.  Past Medical History:  Diagnosis Date  . Anxiety   . Arthritis   . Cancer [facility])    Right Breast Cancer  . COPD (chronic obstructive pulmonary disease) ([place])   . Dyspnea    with exertion  . GERD (gastroesophageal reflux disease)   . Hypertension     Past Surgical History:  Procedure Laterality Date  . ABDOMINAL HYSTERECTOMY  1990   Partial  . BREAST BIOPSY Right 10/04/2016   axilla lymph node and axillay tail mass biopsy. Path pending  . DILATION AND CURETTAGE OF UTERUS      Family History  Problem Relation Age of Onset  . Colon cancer Mother   . Breast cancer Neg Hx     Social History:  reports that she quit smoking about 28 years ago. Her smoking use included cigarettes. She smoked 0.50 packs per day. she has never used smokeless tobacco. She reports that she does not drink alcohol or use drugs.  Allergies:  Allergies  Allergen Reactions  . Atorvastatin Other (See Comments)    Muscle Pain  . Gabapentin Swelling  . Penicillins Swelling    Medications reviewed.    ROS A multipoint review of  systems was completed, all pertinent positives and negatives are documented within the HPI and the remainder are negative   BP 129/78   Pulse 99   Temp 97.7 F (36.5 C) (Oral)   Ht 5\' 5"  (1.651 m)   Wt 73.9 kg (163 lb)   BMI 27.12 kg/m   Physical Exam General: No acute distress Neck: Supple and nontender Lymph nodes: No palpable cervical, clavicular, axillary lymphadenopathy Breast: Bilateral breast examined.  No palpable abnormalities to either breast Heart: Regular rate and rhythm Abdomen: Soft and nontender Extremities: Moves all extremities well    No results found for this or any previous visit (from the past 48 hour(s)). No results found.  Assessment/Plan:  1. Malignant neoplasm of upper-outer quadrant of right breast in female, estrogen receptor negative ([place]) 76 year old female with biopsy-proven right breast cancer.  Discussed the surgical options in detail to include lumpectomy versus mastectomy and the postoperative requirements for cancer therapy entailed for both of them.  Understanding and uncertainty of what therapy she wishes to proceed with.  Discussed that she would be provided with the opportunity to take home literature tonight and discussed with her family which surgery she wishes to undergo.  Today we chose a surgery date of Wednesday, November 28.  She will call clinic back with whether or not she is going to proceed with lumpectomy with sentinel lymph node biopsy versus mastectomy.  A  total of 25 minutes was used on this encounter with greater than 50% of it used for counseling or coordination of care.   Clayburn Pert, MD FACS General Surgeon  02/28/2017,11:46 AM

## 2017-02-28 NOTE — Progress Notes (Signed)
Outpatient Surgical Follow Up  02/28/2017  Krista Cisneros is an 76 y.o. female.   Chief Complaint  Patient presents with  . New Patient (Initial Visit)    Malignant Neoplasm of Right Breast    HPI: 76 year old female returns to clinic to discuss her breast cancer treatment.  Patient was seen earlier this summer by my partner Dr. Genevive Bi when her Chemo-Port was placed for her neoadjuvant chemotherapy.  Patient was diagnosed with breast cancer earlier this year and she is completed the neoadjuvant therapy that she was going to receive.  She continues to perform self breast exams and states that she never could feel the area that was biopsy-proven positive.  She recently had a hospitalization secondary to renal insufficiency and fear of a pulmonary embolism.  She reports that she is still slowly regaining her strength but continues to have weakness associated with that.  Past Medical History:  Diagnosis Date  . Anxiety   . Arthritis   . Cancer [facility])    Right Breast Cancer  . COPD (chronic obstructive pulmonary disease) ([place])   . Dyspnea    with exertion  . GERD (gastroesophageal reflux disease)   . Hypertension     Past Surgical History:  Procedure Laterality Date  . ABDOMINAL HYSTERECTOMY  1990   Partial  . BREAST BIOPSY Right 10/04/2016   axilla lymph node and axillay tail mass biopsy. Path pending  . DILATION AND CURETTAGE OF UTERUS      Family History  Problem Relation Age of Onset  . Colon cancer Mother   . Breast cancer Neg Hx     Social History:  reports that she quit smoking about 28 years ago. Her smoking use included cigarettes. She smoked 0.50 packs per day. she has never used smokeless tobacco. She reports that she does not drink alcohol or use drugs.  Allergies:  Allergies  Allergen Reactions  . Atorvastatin Other (See Comments)    Muscle Pain  . Gabapentin Swelling  . Penicillins Swelling    Medications reviewed.    ROS A multipoint review of  systems was completed, all pertinent positives and negatives are documented within the HPI and the remainder are negative   BP 129/78   Pulse 99   Temp 97.7 F (36.5 C) (Oral)   Ht 5\' 5"  (1.651 m)   Wt 73.9 kg (163 lb)   BMI 27.12 kg/m   Physical Exam General: No acute distress Neck: Supple and nontender Lymph nodes: No palpable cervical, clavicular, axillary lymphadenopathy Breast: Bilateral breast examined.  No palpable abnormalities to either breast Heart: Regular rate and rhythm Abdomen: Soft and nontender Extremities: Moves all extremities well    No results found for this or any previous visit (from the past 48 hour(s)). No results found.  Assessment/Plan:  1. Malignant neoplasm of upper-outer quadrant of right breast in female, estrogen receptor negative ([place]) 76 year old female with biopsy-proven right breast cancer.  Discussed the surgical options in detail to include lumpectomy versus mastectomy and the postoperative requirements for cancer therapy entailed for both of them.  Understanding and uncertainty of what therapy she wishes to proceed with.  Discussed that she would be provided with the opportunity to take home literature tonight and discussed with her family which surgery she wishes to undergo.  Today we chose a surgery date of Wednesday, November 28.  She will call clinic back with whether or not she is going to proceed with lumpectomy with sentinel lymph node biopsy versus mastectomy.  A  total of 25 minutes was used on this encounter with greater than 50% of it used for counseling or coordination of care.   Clayburn Pert, MD FACS General Surgeon  02/28/2017,11:46 AM

## 2017-02-28 NOTE — Telephone Encounter (Signed)
Patient went to see surgeon today and has questions she would like to ask of Dr Grayland Ormond before he goes on vacation. Her surgery is scheduled for 11/28.Please either return call or schedule an appointment to see her (this is what she requested) [phone]

## 2017-02-28 NOTE — Progress Notes (Signed)
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  INTERVAL HISTORY: Patient returns to clinic today as an add-on to further discuss her surgical options. She feels significantly improved, but not back to her baseline.  She continues to have mild weakness and fatigue. She has no neurologic complaints. She denies any fevers. She has no chest pain, cough, or shortness of breath. She denies any nausea, vomiting, constipation, or diarrhea. She has no urinary complaints. Patient offers no further specific complaints.  REVIEW OF SYSTEMS:   Review of Systems  Constitutional: Positive for malaise/fatigue. Negative for fever and weight loss.  Respiratory: Negative for cough and shortness of breath.   Cardiovascular: Negative.  Negative for chest pain and leg swelling.  Gastrointestinal: Negative.  Negative for abdominal pain, diarrhea and nausea.  Genitourinary: Negative.   Musculoskeletal: Negative.  Negative for joint pain.  Skin: Negative.  Negative for rash.  Neurological: Positive for weakness. Negative for sensory change.  Psychiatric/Behavioral: Negative.  The patient is not nervous/anxious.     As per HPI. Otherwise, a complete review of systems is negative.  PAST MEDICAL HISTORY: Past Medical History:  Diagnosis Date  . Anxiety   . Arthritis   . Cancer [facility])    Right Breast Cancer  . COPD (chronic obstructive pulmonary disease) ([place])   . Dyspnea    with exertion  . GERD (gastroesophageal reflux disease)   . Hypertension     PAST SURGICAL HISTORY: Past Surgical History:  Procedure Laterality Date  .  ABDOMINAL HYSTERECTOMY  1990   Partial  . BREAST BIOPSY Right 10/04/2016   axilla lymph node and axillay tail mass biopsy. Path pending  . DILATION AND CURETTAGE OF UTERUS      FAMILY HISTORY: Family History  Problem Relation Age of Onset  . Colon cancer Mother   . Breast cancer Neg Hx     ADVANCED DIRECTIVES (Y/N):  N  HEALTH MAINTENANCE: Social History   Tobacco Use  . Smoking status: Former Smoker    Packs/day: 0.50    Types: Cigarettes    Last attempt to quit: 07/23/1988    Years since quitting: 28.6  . Smokeless tobacco: Never Used  Substance Use Topics  . Alcohol use: No  . Drug use: No     Colonoscopy:  PAP:  Bone density:  Lipid panel:  Allergies  Allergen Reactions  . Atorvastatin Other (See Comments)    Muscle Pain  . Gabapentin Swelling  . Penicillins Swelling    Current Outpatient Medications  Medication Sig Dispense Refill  . ADVAIR DISKUS 500-50 MCG/DOSE AEPB Inhale 1 puff into the lungs 2 (two) times daily.     Marland Kitchen ALPRAZolam (XANAX) 0.25 MG tablet Take 1 tablet (0.25 mg total) by mouth at bedtime as needed for anxiety. 30 tablet 0  . baclofen (LIORESAL) 10 MG tablet Take 10 mg by mouth at bedtime.  9  . co-enzyme Q-10 30 MG capsule Take 30 mg by mouth daily.    . diphenoxylate-atropine (LOMOTIL) 2.5-0.025 MG tablet Take 1 tablet by mouth 4 (four) times daily as needed for diarrhea or loose stools. 30 tablet 1  . DULoxetine (CYMBALTA) 60 MG capsule Take 1  capsule (60 mg total) by mouth daily. 30 capsule 0  . losartan-hydrochlorothiazide (HYZAAR) 100-25 MG tablet Take 1 tablet by mouth daily.     . montelukast (SINGULAIR) 10 MG tablet Take 10 mg by mouth daily.     . Omega-3 Fatty Acids (FISH OIL PO) Take 1 capsule by mouth daily.     Marland Kitchen omeprazole (PRILOSEC) 20 MG capsule Take 20 mg by mouth daily.    Marland Kitchen oxyCODONE-acetaminophen (ROXICET) 5-325 MG tablet Take 1 tablet by mouth every 6 (six) hours as needed. 30 tablet 0  . Potassium Chloride ER 20 MEQ  TBCR Take 1 tablet by mouth daily. 60 tablet 2  . promethazine (PHENERGAN) 25 MG tablet TAKE 1 TABLET BY MOUTH EVERY 6-8 HOURS AS NEEDED FOR NAUSEA  2  . triamcinolone ointment (KENALOG) 0.5 % Apply 1 application topically 2 (two) times daily. Rash on bilateral upper extremities 30 g 0  . potassium chloride SA (K-DUR,KLOR-CON) 20 MEQ tablet Take 2 tablets (40 mEq total) by mouth daily. This is in addition to ur regular dose of 68mq daily 6 tablet 0   No current facility-administered medications for this visit.    Facility-Administered Medications Ordered in Other Visits  Medication Dose Route Frequency Provider Last Rate Last Dose  . 0.9 %  sodium chloride infusion   Intravenous Once FLloyd Huger MD      . 0.9 %  sodium chloride infusion   Intravenous Once FLloyd Huger MD      . 0.9 %  sodium chloride infusion   Intravenous Once FLloyd Huger MD      . 0.9 %  sodium chloride infusion   Intravenous Continuous FGrayland Ormond TKathlene November MD      . ondansetron (ZOFRAN) 8 mg in sodium chloride 0.9 % 50 mL IVPB   Intravenous Once FLloyd Huger MD        OBJECTIVE: Vitals:   03/02/17 1150  BP: (!) 154/74  Pulse: (!) 105  Resp: 18  Temp: 99.3 F (37.4 C)     Body mass index is 26.58 kg/m.    ECOG FS:0 - Asymptomatic  General: Well-developed, well-nourished, no acute distress. Eyes: Pink conjunctiva, anicteric sclera. Breasts: Exam deferred today. Lungs: Clear to auscultation bilaterally. Heart: Regular rate and rhythm. No rubs, murmurs, or gallops. Abdomen: Soft, nontender, nondistended. No organomegaly noted, normoactive bowel sounds. Musculoskeletal: Non-pitting edema in BLE. No cyanosis, or clubbing. Neuro: Alert, answering all questions appropriately. Cranial nerves grossly intact. Skin: No rashes or petechiae noted. Psych: Normal affect.   LAB RESULTS:  Lab Results  Component Value Date   NA 137 02/20/2017   K 3.7 02/20/2017   CL 104 02/20/2017    CO2 23 02/20/2017   GLUCOSE 114 (H) 02/20/2017   BUN 14 02/20/2017   CREATININE 1.26 (H) 02/20/2017   CALCIUM 8.5 (L) 02/20/2017   PROT 6.8 02/20/2017   ALBUMIN 3.0 (L) 02/20/2017   AST 25 02/20/2017   ALT 12 (L) 02/20/2017   ALKPHOS 74 02/20/2017   BILITOT 0.4 02/20/2017   GFRNONAA 41 (L) 02/20/2017   GFRAA 47 (L) 02/20/2017    Lab Results  Component Value Date   WBC 5.9 02/20/2017   NEUTROABS 2.7 02/20/2017   HGB 9.5 (L) 02/20/2017   HCT 28.6 (L) 02/20/2017   MCV 98.7 02/20/2017   PLT 369 02/20/2017     STUDIES: No results found.  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  PLAN:    1. 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 Patient received cycle 5 of 6 on January 30, 2017. MUGA scan from January 19, 2017 revealed an EF of 68% which is unchanged from previous.  After lengthy discussion with the patient, she agreed with the surgical recommendation of pursuing lumpectomy with sentinel biopsy followed by adjuvant XRT.  Surgery has been scheduled for March 21, 2017.  Given the PR positivity of her tumor she will benefit from an aromatase inhibitor for 5 years.  Return to clinic in as previously scheduled for consideration of cycle 7 of 18 of Herceptin only.   2. Acute renal failure: Nearly resolved. Monitor. 3. Nausea: Continue current antiemetics as ordered. 4. Hypokalemia: Improved. Continue oral potassium supplementation as prescribed. 5. Hypomagnesemia: Continue oral supplementation.  Approximately 30 minutes was spent in discussion of which greater than 50% was consultation.  Patient expressed understanding and was in  agreement with this plan. She also understands that She can call clinic at any time with any questions, concerns, or complaints.   Cancer Staging Malignant neoplasm of upper outer quadrant of female breast [facility]) Staging form: Breast, AJCC 8th Edition - Clinical stage from 10/16/2016: Stage IIB (cT2, cN1, cM0, G3, ER: Negative, PR: Positive, HER2: Positive) - Signed by Lloyd Huger, MD on 10/16/2016   Lloyd Huger, MD   03/02/2017 2:30 PM

## 2017-02-28 NOTE — Telephone Encounter (Signed)
I have scheduled the patient for Friday Nov 9th, 2018 @ 11:15 am at the [facility] office.

## 2017-03-01 ENCOUNTER — Other Ambulatory Visit: Payer: Self-pay | Admitting: General Surgery

## 2017-03-01 ENCOUNTER — Telehealth: Payer: Self-pay | Admitting: General Surgery

## 2017-03-01 DIAGNOSIS — Z171 Estrogen receptor negative status [ER-]: Principal | ICD-10-CM

## 2017-03-01 DIAGNOSIS — C50411 Malignant neoplasm of upper-outer quadrant of right female breast: Secondary | ICD-10-CM

## 2017-03-01 NOTE — Telephone Encounter (Signed)
I have called patient to discuss her surgery information. No answer on cell phone, was not able to leave a message due to voicemail being full. I have called the home phone, no answer. I was able to leave a message on answering machine. I will try back at a later time.

## 2017-03-01 NOTE — Addendum Note (Signed)
Addended by: Clayburn Pert T on: 03/01/2017 02:06 PM   Modules accepted: Orders

## 2017-03-02 ENCOUNTER — Inpatient Hospital Stay: Payer: Medicare HMO | Attending: Oncology | Admitting: Oncology

## 2017-03-02 VITALS — BP 154/74 | HR 105 | Temp 99.3°F | Resp 18 | Wt 159.7 lb

## 2017-03-02 DIAGNOSIS — Z87891 Personal history of nicotine dependence: Secondary | ICD-10-CM | POA: Insufficient documentation

## 2017-03-02 DIAGNOSIS — E876 Hypokalemia: Secondary | ICD-10-CM | POA: Diagnosis not present

## 2017-03-02 DIAGNOSIS — R110 Nausea: Secondary | ICD-10-CM | POA: Diagnosis not present

## 2017-03-02 DIAGNOSIS — N179 Acute kidney failure, unspecified: Secondary | ICD-10-CM | POA: Diagnosis not present

## 2017-03-02 DIAGNOSIS — Z5111 Encounter for antineoplastic chemotherapy: Secondary | ICD-10-CM | POA: Diagnosis not present

## 2017-03-02 DIAGNOSIS — K219 Gastro-esophageal reflux disease without esophagitis: Secondary | ICD-10-CM | POA: Insufficient documentation

## 2017-03-02 DIAGNOSIS — Z79899 Other long term (current) drug therapy: Secondary | ICD-10-CM | POA: Insufficient documentation

## 2017-03-02 DIAGNOSIS — R05 Cough: Secondary | ICD-10-CM | POA: Diagnosis not present

## 2017-03-02 DIAGNOSIS — J329 Chronic sinusitis, unspecified: Secondary | ICD-10-CM | POA: Diagnosis not present

## 2017-03-02 DIAGNOSIS — J449 Chronic obstructive pulmonary disease, unspecified: Secondary | ICD-10-CM | POA: Diagnosis not present

## 2017-03-02 DIAGNOSIS — I10 Essential (primary) hypertension: Secondary | ICD-10-CM | POA: Insufficient documentation

## 2017-03-02 DIAGNOSIS — C50411 Malignant neoplasm of upper-outer quadrant of right female breast: Secondary | ICD-10-CM | POA: Insufficient documentation

## 2017-03-02 DIAGNOSIS — Z171 Estrogen receptor negative status [ER-]: Secondary | ICD-10-CM | POA: Insufficient documentation

## 2017-03-02 DIAGNOSIS — F419 Anxiety disorder, unspecified: Secondary | ICD-10-CM | POA: Diagnosis not present

## 2017-03-02 NOTE — Telephone Encounter (Signed)
Patient has called back to obtain all surgery information.   Pt advised of pre op date/time and sx date. Sx:03/21/17 with Dr Sherlon Handing breast lumpectomy with needle localization and SN injection-bx.  Pre op: 03/12/17 @ 8:45am--office interview.   Patient made aware to arrive at [facility] @ 7:45am the day of surgery.

## 2017-03-09 ENCOUNTER — Other Ambulatory Visit: Payer: Self-pay | Admitting: *Deleted

## 2017-03-09 DIAGNOSIS — C50919 Malignant neoplasm of unspecified site of unspecified female breast: Secondary | ICD-10-CM

## 2017-03-12 ENCOUNTER — Telehealth: Payer: Self-pay | Admitting: General Surgery

## 2017-03-12 ENCOUNTER — Inpatient Hospital Stay: Admission: RE | Admit: 2017-03-12 | Payer: Medicare HMO | Source: Ambulatory Visit

## 2017-03-12 NOTE — Progress Notes (Signed)
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  INTERVAL HISTORY: Patient returns to clinic today for Cycle 7 of Herceptin. She feels much better and almost back to baseline.She was recently seen by her PCP for a sinus infection and she was prescribed Doxycycline for 10 days. She has 3 more days of antibiotics. She denies fevers. She has chronic cough but denies any changes with her breathing. She continues to have mild weakness and fatigue. She has no neurologic complaints. She has no chest pain or shortness of breath. She denies any nausea, vomiting, constipation, or diarrhea. She has no urinary complaints. Patient offers no further specific complaints.  REVIEW OF SYSTEMS:   Review of Systems  Constitutional: Positive for malaise/fatigue. Negative for fever and weight loss.  Respiratory: Positive for cough. Negative for shortness of breath.   Cardiovascular: Negative.  Negative for chest pain and leg swelling.  Gastrointestinal: Negative.  Negative for abdominal pain, diarrhea and nausea.  Genitourinary: Negative.   Musculoskeletal: Negative.  Negative for joint pain.  Skin: Negative.  Negative for rash.  Neurological: Positive for weakness. Negative for sensory change.  Psychiatric/Behavioral: Negative.  The patient is not nervous/anxious.     As per HPI. Otherwise, a complete review of systems is negative.  PAST MEDICAL HISTORY: Past Medical History:  Diagnosis Date  . Anxiety   . Arthritis   . Cancer [facility])    Right Breast Cancer  . COPD (chronic obstructive pulmonary disease) ([place])   . Dyspnea    with  exertion  . GERD (gastroesophageal reflux disease)   . Hypertension     PAST SURGICAL HISTORY: Past Surgical History:  Procedure Laterality Date  . ABDOMINAL HYSTERECTOMY  1990   Partial  . BREAST BIOPSY Right 10/04/2016   axilla lymph node and axillay tail mass biopsy. Path pending  . DILATION AND CURETTAGE OF UTERUS    . INSERTION PORT-A-CATH Left 10/24/2016   Performed by [name], MD at [facility] ORS    FAMILY HISTORY: Family History  Problem Relation Age of Onset  . Colon cancer Mother   . Breast cancer Neg Hx     ADVANCED DIRECTIVES (Y/N):  N  HEALTH MAINTENANCE: Social History   Tobacco Use  . Smoking status: Former Smoker    Packs/day: 0.50    Types: Cigarettes    Last attempt to quit: 07/23/1988    Years since quitting: 28.6  . Smokeless tobacco: Never Used  Substance Use Topics  . Alcohol use: No  . Drug use: No     Colonoscopy:  PAP:  Bone density:  Lipid panel:  Allergies  Allergen Reactions  . Atorvastatin Other (See Comments)    Muscle Pain  . Gabapentin Swelling  . Penicillins Swelling and Other (See Comments)    Has patient had a PCN reaction causing immediate rash, facial/tongue/throat swelling, SOB or lightheadedness with hypotension: Yes Has patient had a PCN reaction causing severe rash involving mucus membranes or skin necrosis: No Has patient had a PCN reaction that required hospitalization: No Has patient had a PCN reaction occurring within the last 10 years: No If all of the above answers are "NO", then may proceed with Cephalosporin  use.     Current Outpatient Medications  Medication Sig Dispense Refill  . ADVAIR DISKUS 500-50 MCG/DOSE AEPB Inhale 1 puff into the lungs 2 (two) times daily.     Marland Kitchen ALPRAZolam (XANAX) 0.25 MG tablet Take 1 tablet (0.25 mg total) by mouth at bedtime as needed for anxiety. 30 tablet 0  . Aspirin-Caffeine (BAYER BACK & BODY PAIN EX ST PO) Take 2 tablets daily as needed by mouth (for back pain).    .  baclofen (LIORESAL) 10 MG tablet Take 10 mg by mouth at bedtime.  9  . co-enzyme Q-10 30 MG capsule Take 30 mg by mouth daily.    . diphenoxylate-atropine (LOMOTIL) 2.5-0.025 MG tablet Take 1 tablet by mouth 4 (four) times daily as needed for diarrhea or loose stools. 30 tablet 1  . doxycycline (MONODOX) 100 MG capsule Take by mouth.    . DULoxetine (CYMBALTA) 30 MG capsule Take by mouth. Patient takes total dose of 90 mg    . DULoxetine (CYMBALTA) 60 MG capsule Take 1 capsule (60 mg total) by mouth daily. 30 capsule 0  . lidocaine-prilocaine (EMLA) cream Apply 1 application as needed topically (for port access).    Marland Kitchen losartan-hydrochlorothiazide (HYZAAR) 100-25 MG tablet Take 1 tablet by mouth daily.     . montelukast (SINGULAIR) 10 MG tablet Take 10 mg by mouth daily.     . Omega-3 Fatty Acids (FISH OIL PO) Take 1 capsule by mouth daily.     Marland Kitchen omeprazole (PRILOSEC) 20 MG capsule Take 20 mg by mouth daily.    Marland Kitchen oxyCODONE-acetaminophen (ROXICET) 5-325 MG tablet Take 1 tablet by mouth every 6 (six) hours as needed. (Patient taking differently: Take 1 tablet every 6 (six) hours as needed by mouth for moderate pain. ) 30 tablet 0  . promethazine (PHENERGAN) 25 MG tablet TAKE 25 MG BY MOUTH EVERY 6-8 HOURS AS NEEDED FOR NAUSEA  2  . triamcinolone ointment (KENALOG) 0.5 % Apply 1 application topically 2 (two) times daily. Rash on bilateral upper extremities 30 g 0  . potassium chloride SA (K-DUR,KLOR-CON) 20 MEQ tablet Take 2 tablets (40 mEq total) by mouth daily. This is in addition to ur regular dose of 71mq daily (Patient taking differently: Take 40 mEq daily by mouth. ) 6 tablet 0   Current Facility-Administered Medications  Medication Dose Route Frequency Provider Last Rate Last Dose  . magnesium sulfate IVPB 2 g 50 mL  2 g Intravenous Once BJacquelin Hawking NP       Facility-Administered Medications Ordered in Other Visits  Medication Dose Route Frequency Provider Last Rate Last Dose  . 0.9  %  sodium chloride infusion   Intravenous Once FLloyd Huger MD      . 0.9 %  sodium chloride infusion   Intravenous Once FLloyd Huger MD      . 0.9 %  sodium chloride infusion   Intravenous Once FLloyd Huger MD      . 0.9 %  sodium chloride infusion   Intravenous Continuous FGrayland Ormond TKathlene November MD      . heparin lock flush 100 unit/mL  500 Units Intravenous Once FLloyd Huger MD      . ondansetron ([facility] 8 mg in sodium chloride 0.9 % 50 mL IVPB   Intravenous Once FLloyd Huger MD        OBJECTIVE: Vitals:   03/13/17 0850  BP: (!) 184/99  Pulse: 83  Resp: 20  Temp: (!) 97.5 F (  36.4 C)     Body mass index is 26.01 kg/m.    ECOG FS:0 - Asymptomatic  General: Well-developed, well-nourished, no acute distress. Eyes: Pink conjunctiva, anicteric sclera. Breasts: Exam deferred today. Lungs: Right lung clear. LUL clear. LLL crackles.  Heart: Regular rate and rhythm. No rubs, murmurs, or gallops. Abdomen: Soft, nontender, nondistended. No organomegaly noted, normoactive bowel sounds. Musculoskeletal: Non-pitting edema in BLE. No cyanosis, or clubbing. Neuro: Alert, answering all questions appropriately. Cranial nerves grossly intact. Skin: No rashes or petechiae noted. Psych: Normal affect.   LAB RESULTS:  Lab Results  Component Value Date   NA 139 03/13/2017   K 3.5 03/13/2017   CL 103 03/13/2017   CO2 26 03/13/2017   GLUCOSE 108 (H) 03/13/2017   BUN 23 (H) 03/13/2017   CREATININE 1.28 (H) 03/13/2017   CALCIUM 8.8 (L) 03/13/2017   PROT 7.5 03/13/2017   ALBUMIN 2.8 (L) 03/13/2017   AST 21 03/13/2017   ALT 9 (L) 03/13/2017   ALKPHOS 73 03/13/2017   BILITOT 0.4 03/13/2017   GFRNONAA 40 (L) 03/13/2017   GFRAA 46 (L) 03/13/2017    Lab Results  Component Value Date   WBC 12.0 (H) 03/13/2017   NEUTROABS 7.0 (H) 03/13/2017   HGB 10.7 (L) 03/13/2017   HCT 32.7 (L) 03/13/2017   MCV 96.5 03/13/2017   PLT 488 (H) 03/13/2017      STUDIES: No results found.  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  PLAN:    1. 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 Patient received cycle 5 of 6 on January 30, 2017. MUGA scan from January 19, 2017 revealed an EF of 68% which is unchanged from previous.  After lengthy discussion with the patient, she agreed with the surgical recommendation of pursuing lumpectomy with sentinel biopsy followed by adjuvant XRT.  Surgery has been scheduled for March 21, 2017.  Given the PR positivity of her tumor she will benefit from an aromatase inhibitor for 5 years.  Return to clinic as previously scheduled for consideration of cycle 8 of 18 of Herceptin only.   2. Acute renal failure: Monitor. Approx at baseline. 3. Nausea: Continue current antiemetics as ordered. 4. Hypokalemia: Improved. Continue oral potassium supplementation as prescribed. 5. Hypomagnesemia: Mag 1.3 today. 2 g Magnesium IV during infusion. Continue oral supplementation. 6. Sinus Infection: Continue Doxycycline as prescribed by PCP. 7. Cough: Continue OTC medication. Left lung crackles in base. Continue to watch this. Labs look ok. Patient is asymptomatic at this time. Explained if she develops a fever or starts to have an increase in sputum production to let us know.  8. Hypertension: BP elevated this morning. She has not taken BP meds this morning. She will take them and have BP re-checked in infusion suite.   Approximately 30 minutes was spent in discussion of which greater than 50% was consultation.  Patient expressed understanding and was in agreement with this plan. She also  understands that She can call clinic at any time with any questions, concerns, or complaints.   Cancer Staging Malignant neoplasm of upper outer quadrant of female breast [facility]) Staging form: Breast, AJCC 8th Edition - Clinical stage from 10/16/2016: Stage IIB (cT2, cN1, cM0, G3, ER: Negative, PR: Positive,  HER2: Positive) - Signed by Lloyd Huger, MD on 10/16/2016   Jacquelin Hawking, NP   03/13/2017 10:33 AM

## 2017-03-12 NOTE — Telephone Encounter (Signed)
I have called patient to let her know that pre admit has called and stated that she has missed her pre admit appointment today 03/12/17 @ 8:45am. I have left a message on mobil number and was not able to leave a message due to voicemail being full. Patient will need to call pre admit to reschedule at [phone].

## 2017-03-13 ENCOUNTER — Other Ambulatory Visit: Payer: Self-pay

## 2017-03-13 ENCOUNTER — Inpatient Hospital Stay (HOSPITAL_BASED_OUTPATIENT_CLINIC_OR_DEPARTMENT_OTHER): Payer: Medicare HMO | Admitting: Oncology

## 2017-03-13 ENCOUNTER — Encounter: Payer: Self-pay | Admitting: Oncology

## 2017-03-13 ENCOUNTER — Inpatient Hospital Stay: Payer: Medicare HMO

## 2017-03-13 VITALS — BP 184/99 | HR 83 | Temp 97.5°F | Resp 20 | Wt 156.3 lb

## 2017-03-13 DIAGNOSIS — R05 Cough: Secondary | ICD-10-CM

## 2017-03-13 DIAGNOSIS — Z79899 Other long term (current) drug therapy: Secondary | ICD-10-CM

## 2017-03-13 DIAGNOSIS — C50411 Malignant neoplasm of upper-outer quadrant of right female breast: Secondary | ICD-10-CM

## 2017-03-13 DIAGNOSIS — Z5111 Encounter for antineoplastic chemotherapy: Secondary | ICD-10-CM | POA: Diagnosis not present

## 2017-03-13 DIAGNOSIS — E876 Hypokalemia: Secondary | ICD-10-CM | POA: Diagnosis not present

## 2017-03-13 DIAGNOSIS — R110 Nausea: Secondary | ICD-10-CM | POA: Diagnosis not present

## 2017-03-13 DIAGNOSIS — Z171 Estrogen receptor negative status [ER-]: Secondary | ICD-10-CM

## 2017-03-13 DIAGNOSIS — C50919 Malignant neoplasm of unspecified site of unspecified female breast: Secondary | ICD-10-CM

## 2017-03-13 DIAGNOSIS — J329 Chronic sinusitis, unspecified: Secondary | ICD-10-CM

## 2017-03-13 DIAGNOSIS — C50419 Malignant neoplasm of upper-outer quadrant of unspecified female breast: Secondary | ICD-10-CM

## 2017-03-13 LAB — CBC WITH DIFFERENTIAL/PLATELET
Basophils Absolute: 0.1 10*3/uL (ref 0–0.1)
Basophils Relative: 1 %
EOS ABS: 0.5 10*3/uL (ref 0–0.7)
Eosinophils Relative: 4 %
HCT: 32.7 % — ABNORMAL LOW (ref 35.0–47.0)
HEMOGLOBIN: 10.7 g/dL — AB (ref 12.0–16.0)
LYMPHS ABS: 3.4 10*3/uL (ref 1.0–3.6)
LYMPHS PCT: 29 %
MCH: 31.5 pg (ref 26.0–34.0)
MCHC: 32.7 g/dL (ref 32.0–36.0)
MCV: 96.5 fL (ref 80.0–100.0)
MONOS PCT: 8 %
Monocytes Absolute: 1 10*3/uL — ABNORMAL HIGH (ref 0.2–0.9)
NEUTROS PCT: 58 %
Neutro Abs: 7 10*3/uL — ABNORMAL HIGH (ref 1.4–6.5)
Platelets: 488 10*3/uL — ABNORMAL HIGH (ref 150–440)
RBC: 3.39 MIL/uL — AB (ref 3.80–5.20)
RDW: 18.1 % — ABNORMAL HIGH (ref 11.5–14.5)
WBC: 12 10*3/uL — AB (ref 3.6–11.0)

## 2017-03-13 LAB — COMPREHENSIVE METABOLIC PANEL
ALT: 9 U/L — AB (ref 14–54)
AST: 21 U/L (ref 15–41)
Albumin: 2.8 g/dL — ABNORMAL LOW (ref 3.5–5.0)
Alkaline Phosphatase: 73 U/L (ref 38–126)
Anion gap: 10 (ref 5–15)
BUN: 23 mg/dL — AB (ref 6–20)
CHLORIDE: 103 mmol/L (ref 101–111)
CO2: 26 mmol/L (ref 22–32)
CREATININE: 1.28 mg/dL — AB (ref 0.44–1.00)
Calcium: 8.8 mg/dL — ABNORMAL LOW (ref 8.9–10.3)
GFR calc non Af Amer: 40 mL/min — ABNORMAL LOW (ref >60)
GFR, EST AFRICAN AMERICAN: 46 mL/min — AB (ref >60)
Glucose, Bld: 108 mg/dL — ABNORMAL HIGH (ref 65–99)
POTASSIUM: 3.5 mmol/L (ref 3.5–5.1)
SODIUM: 139 mmol/L (ref 135–145)
Total Bilirubin: 0.4 mg/dL (ref 0.3–1.2)
Total Protein: 7.5 g/dL (ref 6.5–8.1)

## 2017-03-13 LAB — MAGNESIUM: Magnesium: 1.3 mg/dL — ABNORMAL LOW (ref 1.7–2.4)

## 2017-03-13 MED ORDER — HEPARIN SOD (PORK) LOCK FLUSH 100 UNIT/ML IV SOLN
500.0000 [IU] | Freq: Once | INTRAVENOUS | Status: AC
  Administered 2017-03-13: 500 [IU] via INTRAVENOUS

## 2017-03-13 MED ORDER — MAGNESIUM SULFATE 2 GM/50ML IV SOLN
2.0000 g | Freq: Once | INTRAVENOUS | Status: AC
  Administered 2017-03-13: 2 g via INTRAVENOUS

## 2017-03-13 MED ORDER — SODIUM CHLORIDE 0.9% FLUSH
10.0000 mL | Freq: Once | INTRAVENOUS | Status: AC
  Administered 2017-03-13: 10 mL via INTRAVENOUS
  Filled 2017-03-13: qty 10

## 2017-03-13 MED ORDER — HEPARIN SOD (PORK) LOCK FLUSH 100 UNIT/ML IV SOLN
INTRAVENOUS | Status: AC
  Filled 2017-03-13: qty 5

## 2017-03-13 MED ORDER — ACETAMINOPHEN 325 MG PO TABS
650.0000 mg | ORAL_TABLET | Freq: Once | ORAL | Status: AC
  Administered 2017-03-13: 650 mg via ORAL
  Filled 2017-03-13: qty 2

## 2017-03-13 MED ORDER — DIPHENHYDRAMINE HCL 25 MG PO CAPS
25.0000 mg | ORAL_CAPSULE | Freq: Once | ORAL | Status: AC
  Administered 2017-03-13: 25 mg via ORAL
  Filled 2017-03-13: qty 1

## 2017-03-13 MED ORDER — SODIUM CHLORIDE 0.9 % IV SOLN
Freq: Once | INTRAVENOUS | Status: AC
Start: 2017-03-13 — End: 2017-03-13
  Administered 2017-03-13: 10:00:00 via INTRAVENOUS
  Filled 2017-03-13: qty 1000

## 2017-03-13 MED ORDER — TRASTUZUMAB CHEMO 150 MG IV SOLR
450.0000 mg | Freq: Once | INTRAVENOUS | Status: AC
  Administered 2017-03-13: 450 mg via INTRAVENOUS
  Filled 2017-03-13: qty 21.43

## 2017-03-13 MED ORDER — SODIUM CHLORIDE 0.9 % IV SOLN: 2.0000 g | Freq: Once | INTRAVENOUS | Status: DC

## 2017-03-13 NOTE — Pre-Procedure Instructions (Signed)
EKG  EKG reviewed and interpreted by myself shows sinus tachycardia 101 bpm, narrow QRS, normal axis, normal intervals, nonspecific but no concerning ST changes.  ____________________________________________    RADIOLOGY  chest x-ray shows COPD without acute abnormality.  ____________________________________________   INITIAL IMPRESSION / ASSESSMENT AND PLAN / ED COURSE  Pertinent labs & imaging results that were available during my care of the patient were reviewed by me and considered in my medical decision making (see chart for details).  patient presents the emergency department for chest pain worse with deep inspiration and diffuse body aches. Differential this time include ACS, PE, pneumothorax, pneumonia, chest wall pain, pleurisy. We will check labs, chest x-ray and continue to closely monitor.  Labs are largely at baseline with a moderate leukocytosis but the patient is on Neulasta. Chest x-ray shows no acute changes given the patient's pleuritic nature of her pain we will obtain a CT angiography to rule out pulmonary embolus. Uric acid and creatinine kinase levels are pending.  add on labs are largely within normal limits, slight uric acid elevation. We will discharge with pain medication. Patient states she is feeling well and is ready to go home.  ____________________________________________   FINAL CLINICAL IMPRESSION(S) / ED DIAGNOSES  chest pain bodyaches    Harvest Dark, MD 01/22/17 2106           Electronically signed by Harvest Dark, MD at 01/22/2017 9:06 PM     ED on 01/22/2017        Detailed Report

## 2017-03-13 NOTE — Progress Notes (Signed)
Patient denies any concerns today.  

## 2017-03-20 ENCOUNTER — Encounter
Admission: RE | Admit: 2017-03-20 | Discharge: 2017-03-20 | Disposition: A | Discharge status: Home / Self Care | Payer: Medicare HMO | Source: Ambulatory Visit | Attending: General Surgery | Admitting: General Surgery

## 2017-03-20 ENCOUNTER — Other Ambulatory Visit: Payer: Self-pay

## 2017-03-20 DIAGNOSIS — J449 Chronic obstructive pulmonary disease, unspecified: Secondary | ICD-10-CM | POA: Diagnosis not present

## 2017-03-20 DIAGNOSIS — F419 Anxiety disorder, unspecified: Secondary | ICD-10-CM | POA: Diagnosis not present

## 2017-03-20 DIAGNOSIS — Z880 Allergy status to penicillin: Secondary | ICD-10-CM | POA: Diagnosis not present

## 2017-03-20 DIAGNOSIS — C50411 Malignant neoplasm of upper-outer quadrant of right female breast: Secondary | ICD-10-CM | POA: Diagnosis not present

## 2017-03-20 DIAGNOSIS — I10 Essential (primary) hypertension: Secondary | ICD-10-CM | POA: Diagnosis not present

## 2017-03-20 DIAGNOSIS — Z888 Allergy status to other drugs, medicaments and biological substances status: Secondary | ICD-10-CM | POA: Diagnosis not present

## 2017-03-20 DIAGNOSIS — F329 Major depressive disorder, single episode, unspecified: Secondary | ICD-10-CM | POA: Diagnosis not present

## 2017-03-20 DIAGNOSIS — K219 Gastro-esophageal reflux disease without esophagitis: Secondary | ICD-10-CM | POA: Diagnosis not present

## 2017-03-20 DIAGNOSIS — Z87891 Personal history of nicotine dependence: Secondary | ICD-10-CM | POA: Diagnosis not present

## 2017-03-20 DIAGNOSIS — M1990 Unspecified osteoarthritis, unspecified site: Secondary | ICD-10-CM | POA: Diagnosis not present

## 2017-03-20 DIAGNOSIS — I739 Peripheral vascular disease, unspecified: Secondary | ICD-10-CM | POA: Diagnosis not present

## 2017-03-20 DIAGNOSIS — Z171 Estrogen receptor negative status [ER-]: Secondary | ICD-10-CM | POA: Diagnosis not present

## 2017-03-20 HISTORY — DX: Hyperlipidemia, unspecified: E78.5

## 2017-03-20 HISTORY — DX: Major depressive disorder, single episode, unspecified: F32.9

## 2017-03-20 HISTORY — DX: Depression, unspecified: F32.A

## 2017-03-20 MED ORDER — CIPROFLOXACIN IN D5W 400 MG/200ML IV SOLN
400.0000 mg | INTRAVENOUS | Status: AC
  Administered 2017-03-21: 400 mg via INTRAVENOUS

## 2017-03-20 NOTE — Patient Instructions (Addendum)
Your procedure is scheduled on 03/21/17 @ 8:30  Report to Radiology..  Remember: Instructions that are not followed completely may result in serious medical risk, up to and including death, or upon the discretion of your surgeon and anesthesiologist your surgery may need to be rescheduled.     _X__ 1. Do not eat food after midnight the night before your procedure.                 No gum chewing or hard candies. You may drink clear liquids up to 2 hours                 before you are scheduled to arrive for your surgery- DO not drink clear                 liquids within 2 hours of the start of your surgery.                 Clear Liquids include:  water, apple juice without pulp, clear carbohydrate                 drink such as Clearfast of Gartorade, Black Coffee or Tea (Do not add                 anything to coffee or tea).     _X__ 2.  No Alcohol for 24 hours before or after surgery.   __ 3.  Do Not Smoke or use e-cigarettes For 24 Hours Prior to Your Surgery.                 Do not use any chewable tobacco products for at least 6 hours prior to                 surgery.  ____  4.  Bring all medications with you on the day of surgery if instructed.   ____  5.  Notify your doctor if there is any change in your medical condition      (cold, fever, infections).     Do not wear jewelry, make-up, hairpins, clips or nail polish. Do not wear lotions, powders, or perfumes. You may wear deodorant. Do not shave 48 hours prior to surgery. Men may shave face and neck. Do not bring valuables to the hospital.    [facility] is not responsible for any belongings or valuables.  Contacts, dentures or bridgework may not be worn into surgery. Leave your suitcase in the car. After surgery it may be brought to your room. For patients admitted to the hospital, discharge time is determined by your treatment team.   Patients discharged the day of surgery will not be allowed to  drive home.   Please read over the following fact sheets that you were given:    _x___ Take these medicines the morning of surgery with A SIP OF WATER:    1. DULoxetine (CYMBALTA) 30 MG capsule  2. omeprazole (PRILOSEC) 20 MG capsule take extra dose the night before surgery and the morning of surgery  3. oxyCODONE-acetaminophen (ROXICET) 5-325 MG tablet id needed  4.potassium chloride SA (K-DUR,KLOR-CON) 20 MEQ tablet  5.  6.  ____ Fleet Enema (as directed)   _x___ Use CHG Soap as directed  _x___ Use inhalers on the day of surgeryADVAIR DISKUS 500-50 MCG/DOSE AEPB  ____ Stop metformin 2 days prior to surgery    ____ Take 1/2 of usual insulin dose the night before surgery. No insulin the morning  of surgery.   __x__ Stop aspirin already  __x__ Stop Anti-inflammatories on  Tylenol OK   __x__ Stop supplements until after surgery.   Omega-3 Fatty Acids (FISH OIL PO) ____ Bring C-Pap to the hospital.

## 2017-03-20 NOTE — Pre-Procedure Instructions (Signed)
Dr. Rosey Bath notified of assessment and OK'd proceeding with surgery. Instructed to take K replacement in AM and will repeat K before surgery.

## 2017-03-21 ENCOUNTER — Ambulatory Visit
Admission: RE | Admit: 2017-03-21 | Discharge: 2017-03-21 | Disposition: A | Discharge status: Home / Self Care | Payer: Medicare HMO | Source: Ambulatory Visit | Attending: General Surgery | Admitting: General Surgery

## 2017-03-21 ENCOUNTER — Other Ambulatory Visit: Payer: Self-pay

## 2017-03-21 ENCOUNTER — Ambulatory Visit: Payer: Medicare HMO | Admitting: Anesthesiology

## 2017-03-21 ENCOUNTER — Encounter
Admission: RE | Disposition: A | Discharge status: Home / Self Care | Payer: Self-pay | Source: Ambulatory Visit | Attending: General Surgery

## 2017-03-21 ENCOUNTER — Encounter: Payer: Self-pay | Admitting: *Deleted

## 2017-03-21 DIAGNOSIS — C50411 Malignant neoplasm of upper-outer quadrant of right female breast: Secondary | ICD-10-CM

## 2017-03-21 DIAGNOSIS — I739 Peripheral vascular disease, unspecified: Secondary | ICD-10-CM | POA: Insufficient documentation

## 2017-03-21 DIAGNOSIS — Z171 Estrogen receptor negative status [ER-]: Principal | ICD-10-CM

## 2017-03-21 DIAGNOSIS — Z87891 Personal history of nicotine dependence: Secondary | ICD-10-CM | POA: Insufficient documentation

## 2017-03-21 DIAGNOSIS — Z170 Estrogen receptor positive status [ER+]: Secondary | ICD-10-CM

## 2017-03-21 DIAGNOSIS — Z880 Allergy status to penicillin: Secondary | ICD-10-CM | POA: Insufficient documentation

## 2017-03-21 DIAGNOSIS — I10 Essential (primary) hypertension: Secondary | ICD-10-CM | POA: Insufficient documentation

## 2017-03-21 DIAGNOSIS — J449 Chronic obstructive pulmonary disease, unspecified: Secondary | ICD-10-CM | POA: Insufficient documentation

## 2017-03-21 DIAGNOSIS — Z888 Allergy status to other drugs, medicaments and biological substances status: Secondary | ICD-10-CM | POA: Insufficient documentation

## 2017-03-21 DIAGNOSIS — K219 Gastro-esophageal reflux disease without esophagitis: Secondary | ICD-10-CM | POA: Insufficient documentation

## 2017-03-21 DIAGNOSIS — M1990 Unspecified osteoarthritis, unspecified site: Secondary | ICD-10-CM | POA: Insufficient documentation

## 2017-03-21 DIAGNOSIS — F419 Anxiety disorder, unspecified: Secondary | ICD-10-CM | POA: Insufficient documentation

## 2017-03-21 DIAGNOSIS — F329 Major depressive disorder, single episode, unspecified: Secondary | ICD-10-CM | POA: Insufficient documentation

## 2017-03-21 HISTORY — PX: BREAST LUMPECTOMY WITH NEEDLE LOCALIZATION: SHX5759

## 2017-03-21 HISTORY — PX: SENTINEL NODE BIOPSY: SHX6608

## 2017-03-21 HISTORY — PX: BREAST LUMPECTOMY: SHX2

## 2017-03-21 LAB — POCT I-STAT 4, (NA,K, GLUC, HGB,HCT)
Glucose, Bld: 126 mg/dL — ABNORMAL HIGH (ref 65–99)
HCT: 35 % — ABNORMAL LOW (ref 36.0–46.0)
HEMOGLOBIN: 11.9 g/dL — AB (ref 12.0–15.0)
Potassium: 3.3 mmol/L — ABNORMAL LOW (ref 3.5–5.1)
Sodium: 141 mmol/L (ref 135–145)

## 2017-03-21 IMAGING — MG MM PLC BREAST LOC DEV 1ST LESION INC*R*
8 of 12 series · 8 of 28 positions shown · non-contrast
Comparison: Previous exams.

CLINICAL DATA: Patient presents for mammographically guided needle
localization of an axillary region right breast carcinoma with an
adjacent metastatic lymph node. These lesions were marked with a
ribbon shaped clip and a HydroMARK clip respectively.

EXAM:
NEEDLE LOCALIZATION OF THE RIGHT BREAST WITH MAMMO GUIDANCE

[R MLO (1 of 4)]
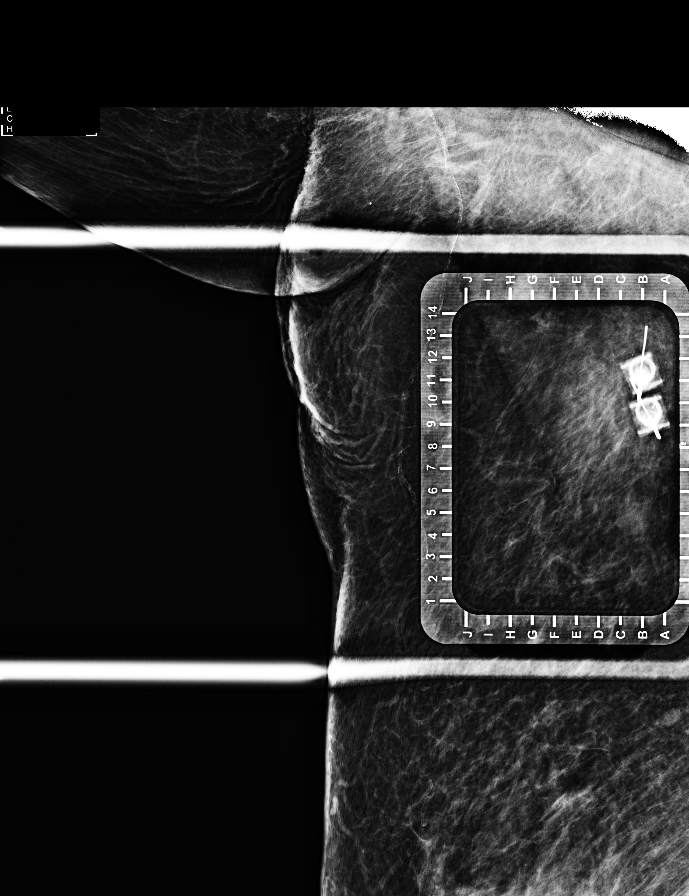

[R MLO synth-2D (1 of 4)]
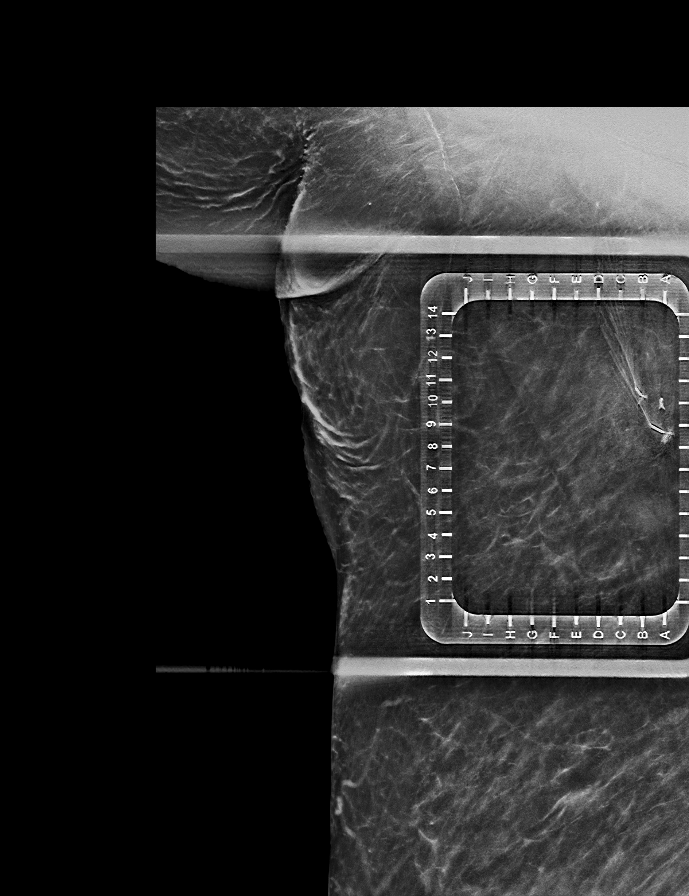

[R MLO synth-2D (2 of 4)]
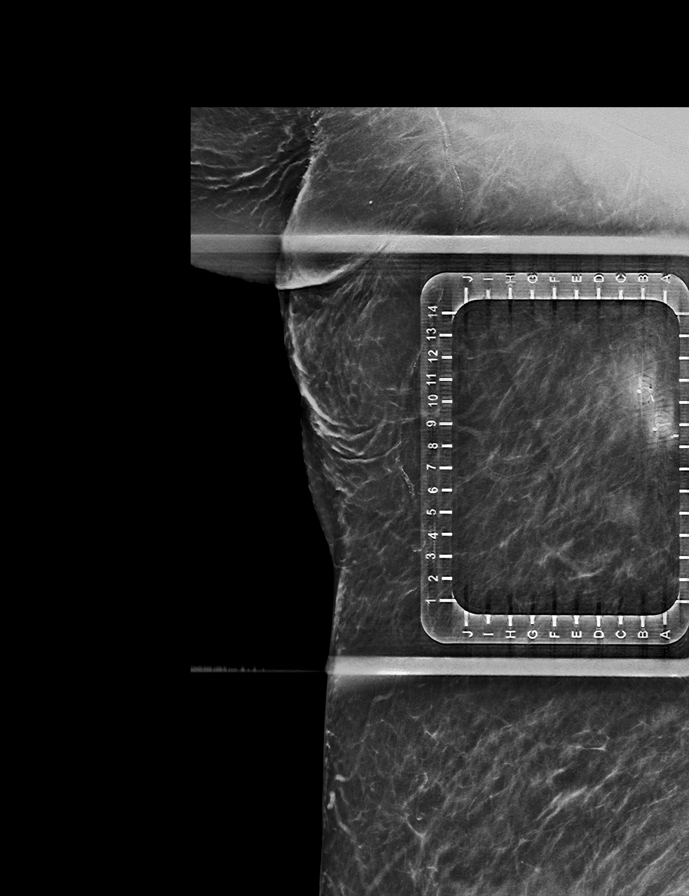

[R MLO synth-2D (3 of 4)]
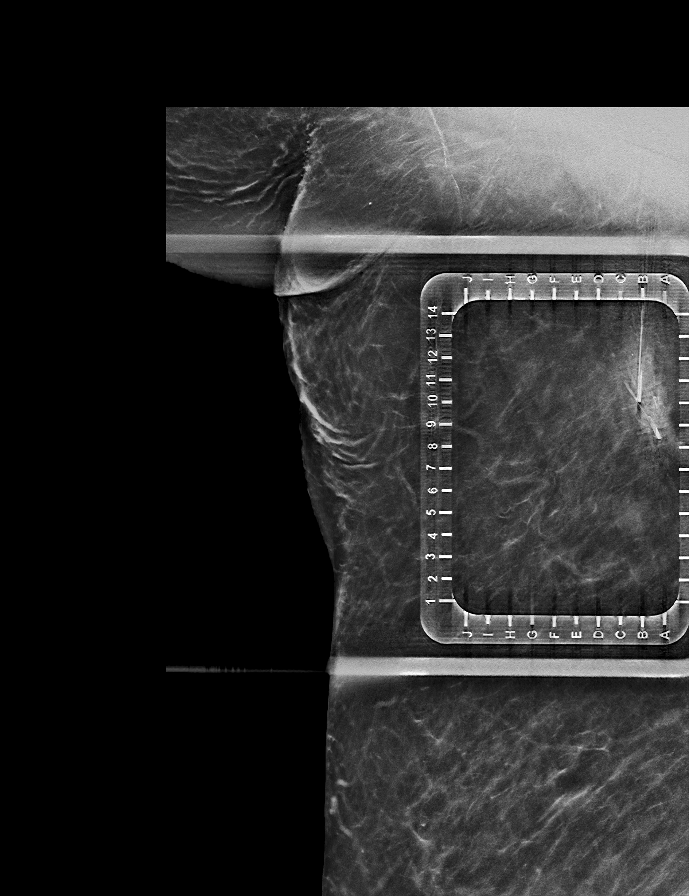

[R MLO synth-2D (4 of 4)]
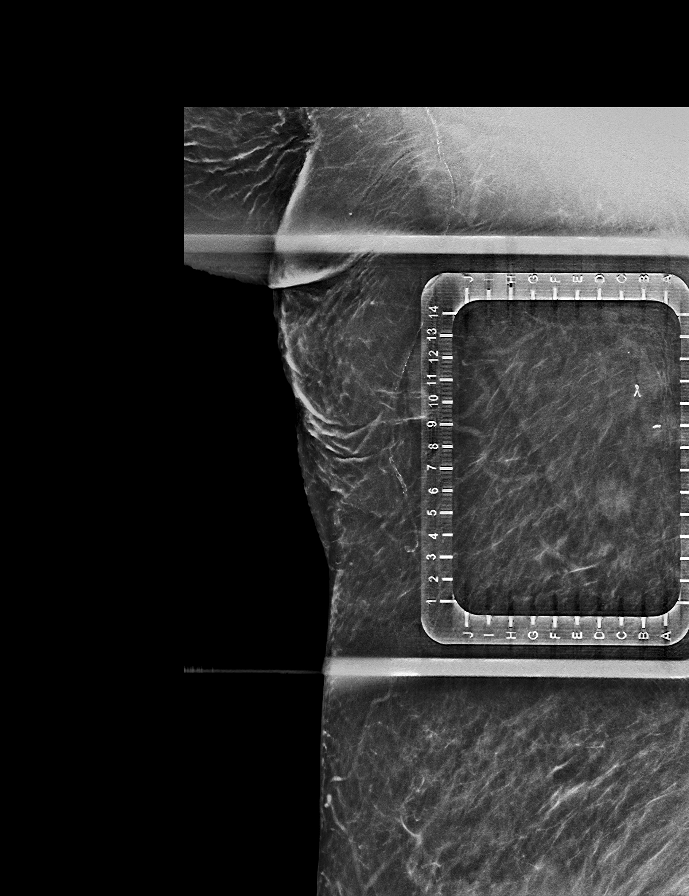

[R MLO (2 of 4)]
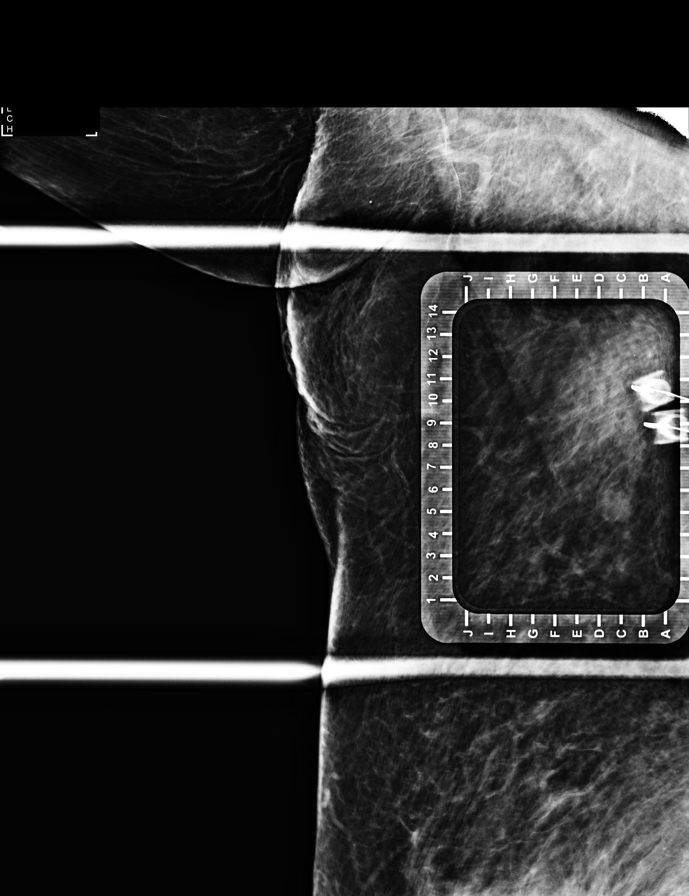

[R MLO (3 of 4)]
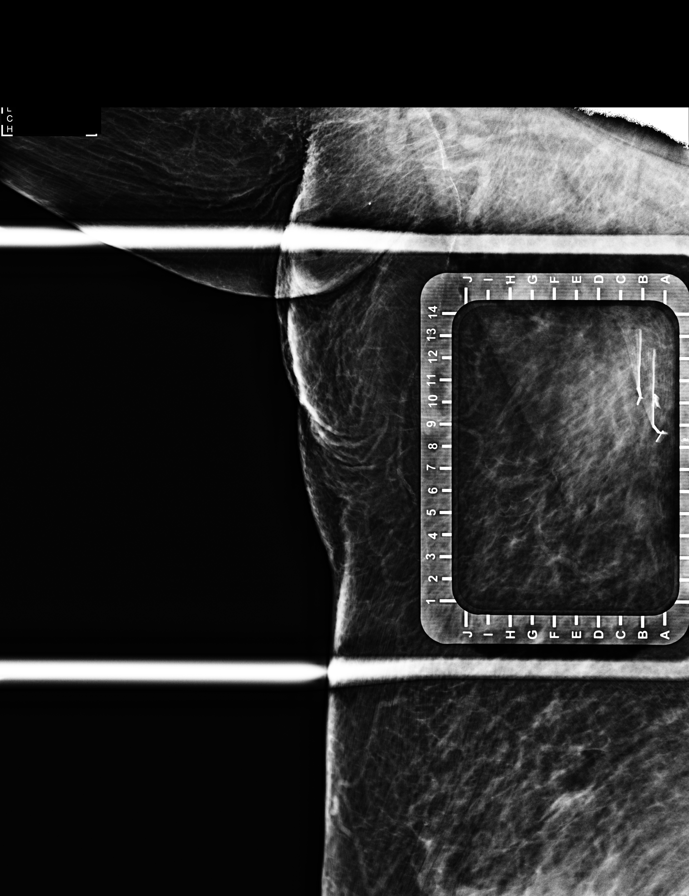

[R MLO (4 of 4)]
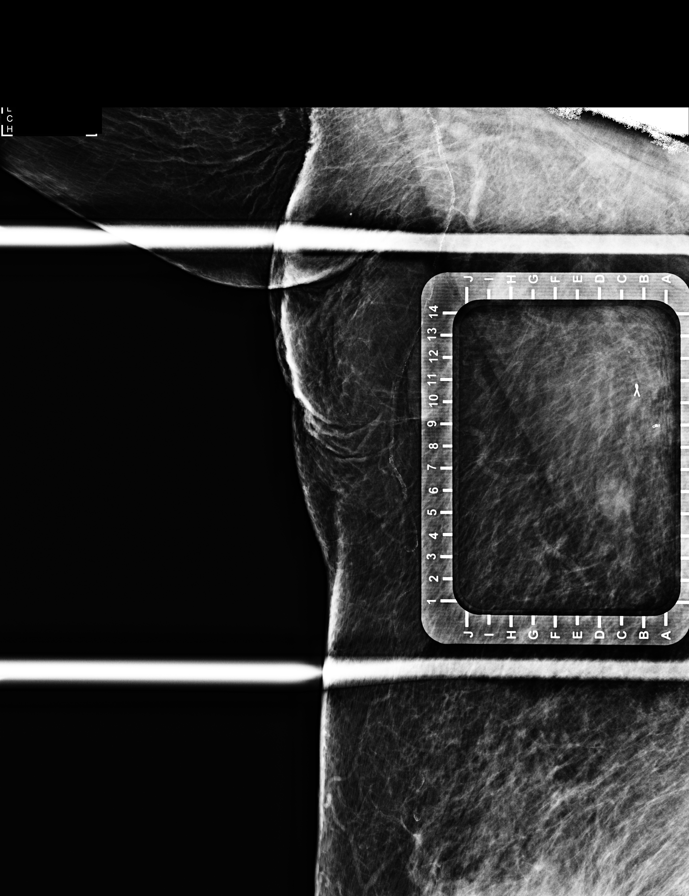

[8 of 28 positions shown; findings below may reference images not displayed]

FINDINGS: Patient presents for needle localization prior to surgical excision.
I met with the patient and we discussed the procedure of needle
localization including benefits and alternatives. We discussed the
high likelihood of a successful procedure. We discussed the risks of
the procedure, including infection, bleeding, tissue injury, and
further surgery. Informed, written consent was given. The usual
time-out protocol was performed immediately prior to the procedure.

The axilla region mass and the adjacent enlarged lymph nodes seen
previously are no longer visualized mammographically consistent with
a positive response to neoadjuvant chemotherapy. The ribbon shaped
clip and the HydroMARK clip can only be visualized on any MLO view.
Since these clips were only mammographically visible in one view,
the clips were localized with 3D imaging.

Using mammographic guidance, sterile technique, 1% lidocaine and a 5
cm modified Kopans needle, the ribbon shaped biopsy clip was
localized using anterior approach.

Using mammographic guidance, sterile technique, 1% lidocaine and a 5
cm modified Kopans needle, the coil shaped HydroMARK biopsy clip was
localized using anterior approach.

The images were marked for Dr. EFARIT.
IMPRESSION: Needle localization the right breast/ axilla. No apparent
complications.

## 2017-03-21 SURGERY — BREAST LUMPECTOMY WITH NEEDLE LOCALIZATION
Anesthesia: General | Site: Breast | Laterality: Right | Wound class: Clean

## 2017-03-21 MED ORDER — IPRATROPIUM-ALBUTEROL 0.5-2.5 (3) MG/3ML IN SOLN
3.0000 mL | Freq: Once | RESPIRATORY_TRACT | Status: AC
  Administered 2017-03-21: 3 mL via RESPIRATORY_TRACT

## 2017-03-21 MED ORDER — FENTANYL CITRATE (PF) 100 MCG/2ML IJ SOLN
INTRAMUSCULAR | Status: AC
  Filled 2017-03-21: qty 2

## 2017-03-21 MED ORDER — CIPROFLOXACIN IN D5W 400 MG/200ML IV SOLN
INTRAVENOUS | Status: AC
  Filled 2017-03-21: qty 200

## 2017-03-21 MED ORDER — HEPARIN SOD (PORK) LOCK FLUSH 100 UNIT/ML IV SOLN
INTRAVENOUS | Status: AC
  Filled 2017-03-21: qty 5

## 2017-03-21 MED ORDER — ONDANSETRON HCL 4 MG/2ML IJ SOLN
INTRAMUSCULAR | Status: DC | PRN
  Administered 2017-03-21: 4 mg via INTRAVENOUS

## 2017-03-21 MED ORDER — FENTANYL CITRATE (PF) 100 MCG/2ML IJ SOLN
INTRAMUSCULAR | Status: DC | PRN
  Administered 2017-03-21 (×3): 50 ug via INTRAVENOUS

## 2017-03-21 MED ORDER — GLYCOPYRROLATE 0.2 MG/ML IJ SOLN
INTRAMUSCULAR | Status: AC
  Filled 2017-03-21: qty 1

## 2017-03-21 MED ORDER — METHYLENE BLUE 0.5 % INJ SOLN
INTRAVENOUS | Status: DC | PRN
  Administered 2017-03-21: 8 mL via INTRADERMAL

## 2017-03-21 MED ORDER — CHLORHEXIDINE GLUCONATE CLOTH 2 % EX PADS: 6.0000 | MEDICATED_PAD | Freq: Once | CUTANEOUS | Status: DC

## 2017-03-21 MED ORDER — GLYCOPYRROLATE 0.2 MG/ML IJ SOLN
INTRAMUSCULAR | Status: DC | PRN
Start: 2017-03-21 — End: 2017-03-21
  Administered 2017-03-21: 0.2 mg via INTRAVENOUS

## 2017-03-21 MED ORDER — PROPOFOL 500 MG/50ML IV EMUL
INTRAVENOUS | Status: AC
  Filled 2017-03-21: qty 50

## 2017-03-21 MED ORDER — PENTAFLUOROPROP-TETRAFLUOROETH EX AERO
INHALATION_SPRAY | CUTANEOUS | Status: AC
  Filled 2017-03-21: qty 30

## 2017-03-21 MED ORDER — TECHNETIUM TC 99M SULFUR COLLOID FILTERED
0.8270 | Freq: Once | INTRAVENOUS | Status: AC | PRN
  Administered 2017-03-21: 0.827 via INTRADERMAL

## 2017-03-21 MED ORDER — PHENYLEPHRINE 40 MCG/ML (10ML) SYRINGE FOR IV PUSH (FOR BLOOD PRESSURE SUPPORT)
PREFILLED_SYRINGE | INTRAVENOUS | Status: DC | PRN
  Administered 2017-03-21 (×3): 100 ug via INTRAVENOUS

## 2017-03-21 MED ORDER — METHYLENE BLUE 0.5 % INJ SOLN
INTRAVENOUS | Status: AC
  Filled 2017-03-21: qty 10

## 2017-03-21 MED ORDER — SODIUM CHLORIDE 0.9 % IJ SOLN
INTRAMUSCULAR | Status: AC
  Filled 2017-03-21: qty 10

## 2017-03-21 MED ORDER — OXYCODONE-ACETAMINOPHEN 5-325 MG PO TABS
ORAL_TABLET | ORAL | Status: AC
  Administered 2017-03-21: 1 via ORAL
  Filled 2017-03-21: qty 1

## 2017-03-21 MED ORDER — LACTATED RINGERS IV SOLN
INTRAVENOUS | Status: DC
  Administered 2017-03-21 (×2): via INTRAVENOUS

## 2017-03-21 MED ORDER — DEXAMETHASONE SODIUM PHOSPHATE 10 MG/ML IJ SOLN
INTRAMUSCULAR | Status: DC | PRN
  Administered 2017-03-21: 10 mg via INTRAVENOUS

## 2017-03-21 MED ORDER — DEXTROSE 5 % IV SOLN: INTRAVENOUS | Status: DC | PRN

## 2017-03-21 MED ORDER — EPHEDRINE SULFATE 50 MG/ML IJ SOLN
INTRAMUSCULAR | Status: DC | PRN
  Administered 2017-03-21: 5 mg via INTRAVENOUS
  Administered 2017-03-21: 10 mg via INTRAVENOUS

## 2017-03-21 MED ORDER — DEXAMETHASONE SODIUM PHOSPHATE 10 MG/ML IJ SOLN
INTRAMUSCULAR | Status: AC
  Filled 2017-03-21: qty 1

## 2017-03-21 MED ORDER — PHENYLEPHRINE HCL 10 MG/ML IJ SOLN
INTRAVENOUS | Status: DC | PRN
  Administered 2017-03-21: 20 ug/min via INTRAVENOUS

## 2017-03-21 MED ORDER — IPRATROPIUM-ALBUTEROL 0.5-2.5 (3) MG/3ML IN SOLN
RESPIRATORY_TRACT | Status: AC
  Filled 2017-03-21: qty 3

## 2017-03-21 MED ORDER — FENTANYL CITRATE (PF) 100 MCG/2ML IJ SOLN: 25.0000 ug | INTRAMUSCULAR | Status: DC | PRN

## 2017-03-21 MED ORDER — LIDOCAINE HCL (PF) 2 % IJ SOLN
INTRAMUSCULAR | Status: AC
  Filled 2017-03-21: qty 10

## 2017-03-21 MED ORDER — OXYCODONE-ACETAMINOPHEN 5-325 MG PO TABS
1.0000 | ORAL_TABLET | ORAL | Status: DC | PRN
  Administered 2017-03-21: 1 via ORAL

## 2017-03-21 MED ORDER — ONDANSETRON HCL 4 MG/2ML IJ SOLN
INTRAMUSCULAR | Status: AC
  Filled 2017-03-21: qty 2

## 2017-03-21 MED ORDER — OXYCODONE-ACETAMINOPHEN 5-325 MG PO TABS: 1.0000 | ORAL_TABLET | ORAL | 0 refills | Status: DC | PRN

## 2017-03-21 MED ORDER — PROPOFOL 10 MG/ML IV BOLUS
INTRAVENOUS | Status: DC | PRN
  Administered 2017-03-21: 100 mg via INTRAVENOUS
  Administered 2017-03-21: 20 mg via INTRAVENOUS

## 2017-03-21 MED ORDER — ONDANSETRON HCL 4 MG/2ML IJ SOLN: 4.0000 mg | Freq: Once | INTRAMUSCULAR | Status: DC | PRN

## 2017-03-21 MED ORDER — LIDOCAINE-EPINEPHRINE (PF) 1 %-1:200000 IJ SOLN
INTRAMUSCULAR | Status: DC | PRN
  Administered 2017-03-21: 20 mL via INTRADERMAL

## 2017-03-21 SURGICAL SUPPLY — 34 items
APPLIER CLIP 11 MED OPEN (CLIP) ×3
BLADE SURG 15 STRL LF DISP TIS (BLADE) ×1 IMPLANT
BLADE SURG 15 STRL SS (BLADE) ×2
CANISTER SUCT 1200ML W/VALVE (MISCELLANEOUS) ×3 IMPLANT
CHLORAPREP W/TINT 26ML (MISCELLANEOUS) ×3 IMPLANT
CLIP APPLIE 11 MED OPEN (CLIP) ×1 IMPLANT
COVER PROBE FLX POLY STRL (MISCELLANEOUS) ×3 IMPLANT
DERMABOND ADVANCED (GAUZE/BANDAGES/DRESSINGS) ×2
DERMABOND ADVANCED .7 DNX12 (GAUZE/BANDAGES/DRESSINGS) ×1 IMPLANT
DEVICE DUBIN SPECIMEN MAMMOGRA (MISCELLANEOUS) ×3 IMPLANT
DRAPE LAPAROTOMY TRNSV 106X77 (MISCELLANEOUS) ×3 IMPLANT
ELECT CAUTERY BLADE 6.4 (BLADE) ×3 IMPLANT
ELECT REM PT RETURN 9FT ADLT (ELECTROSURGICAL) ×3
ELECTRODE REM PT RTRN 9FT ADLT (ELECTROSURGICAL) ×1 IMPLANT
GAMMA FINDER STERILE SLEEVE ×3 IMPLANT
GLOVE BIO SURGEON STRL SZ7.5 (GLOVE) ×3 IMPLANT
GLOVE INDICATOR 8.0 STRL GRN (GLOVE) ×3 IMPLANT
GOWN STRL REUS W/ TWL LRG LVL3 (GOWN DISPOSABLE) ×2 IMPLANT
GOWN STRL REUS W/ TWL XL LVL3 (GOWN DISPOSABLE) ×2 IMPLANT
GOWN STRL REUS W/TWL LRG LVL3 (GOWN DISPOSABLE) ×4
GOWN STRL REUS W/TWL XL LVL3 (GOWN DISPOSABLE) ×4
LABEL OR SOLS (LABEL) ×3 IMPLANT
MARGIN MAP 10MM (MISCELLANEOUS) ×3 IMPLANT
NEEDLE HYPO 25X1 1.5 SAFETY (NEEDLE) ×3 IMPLANT
PACK BASIN MINOR ARMC (MISCELLANEOUS) ×3 IMPLANT
SLEVE PROBE SENORX GAMMA FIND (MISCELLANEOUS) ×3 IMPLANT
SUT MNCRL 4-0 (SUTURE) ×2
SUT MNCRL 4-0 27XMFL (SUTURE) ×1
SUT SILK 2 0 SH (SUTURE) ×3 IMPLANT
SUT VIC AB 3-0 SH 27 (SUTURE) ×4
SUT VIC AB 3-0 SH 27X BRD (SUTURE) ×2 IMPLANT
SUTURE MNCRL 4-0 27XMF (SUTURE) ×1 IMPLANT
SYR 10ML LL (SYRINGE) ×3 IMPLANT
WATER STERILE IRR 1000ML POUR (IV SOLUTION) ×3 IMPLANT

## 2017-03-21 NOTE — Discharge Instructions (Signed)
Lumpectomy, Care After This sheet gives you information about how to care for yourself after your procedure. Your health care provider may also give you more specific instructions. If you have problems or questions, contact your health care provider. What can I expect after the procedure? After the procedure, it is common to have:  Breast swelling.  Breast tenderness.  Stiffness in your arm or shoulder.  A change in the shape and feel of your breast.  Scar tissue that feels hard to the touch in the area where the lump was removed.  Follow these instructions at home: Bathing  Take sponge baths until your health care provider says that you can start showering or bathing. Okay to shower 48 hours postop.  Do not take baths, swim, or use a hot tub until your health care provider approves. Incision care  Follow instructions from your health care provider about how to take care of your incision. Make sure you: ? Wash your hands with soap and water before you change your bandage (dressing). If soap and water are not available, use hand sanitizer. ? Change your dressing as told by your health care provider. ? Leave stitches (sutures), skin glue, or adhesive strips in place. These skin closures may need to stay in place for 2 weeks or longer. If adhesive strip edges start to loosen and curl up, you may trim the loose edges. Do not remove adhesive strips completely unless your health care provider tells you to do that.  Check your incision area every day for signs of infection. Check for: ? More redness, swelling, or pain. ? More fluid or blood. ? Warmth. ? Pus or a bad smell.  Keep your dressing clean and dry.  If you were sent home with a surgical drain in place, follow instructions from your health care provider about emptying it. Activity  Return to your normal activities as told by your health care provider. Ask your health care provider what activities are safe for you.  Avoid  activities that require a lot of energy (are strenuous).  Be careful to avoid any activities that could cause an injury to your arm on the side of your surgery.  Do not lift anything that is heavier than 10 lb (4.5 kg). Avoid lifting with the arm that is on the side of your surgery.  Do not carry heavy objects on your shoulder.  After your drain is removed, you should perform exercises to keep your arm from getting stiff and swollen. Talk with your health care provider about which exercises are safe for you. General instructions  Take over-the-counter and prescription medicines only as told by your health care provider.  You may eat what you usually do.  Wear a supportive bra as told by your health care provider.  Raise (elevate) your arm above the level of your heart while you are sitting or lying down.  Do not wear tight jewelry on your arm, wrist, or fingers on the side of your surgery. Follow-up  Keep all follow-up visits as told by your health care provider. This is important.  You may need to be screened for extra fluid around the lymph nodes (lymphedema). Follow instructions from your health care provider about how often you should be checked.  If you had any lymph nodes removed during your procedure, be sure to tell all of your health care providers. This is important information to share before you are involved in certain procedures, such as giving blood or having your blood  pressure taken. Contact a health care provider if:  You develop a rash.  You have a fever.  Your pain medicine is not working.  Your swelling, weakness, or numbness in your arm has not improved after a few weeks.  You have new swelling in your breast or arm.  You have more redness, swelling, or pain in your incision area.  You have more fluid or blood coming from your incision.  Your incision feels warm to the touch.  You have pus or a bad smell coming from your incision. Get help right away  if:  You have very bad pain in your breast or arm.  You have chest pain.  You have difficulty breathing. This information is not intended to replace advice given to you by your health care provider. Make sure you discuss any questions you have with your health care provider. Document Released: 04/26/2006 Document Revised: 12/22/2015 Document Reviewed: 12/22/2015 Elsevier Interactive Patient Education  2018 [employer].

## 2017-03-21 NOTE — Progress Notes (Signed)
Dr Kayleen Memos in to see pt  Pt answers some questions  Name and knows where she is  Repeats some words over rolling head side to side   Moving all extremities

## 2017-03-21 NOTE — Progress Notes (Signed)
Duo neb given for low sats   On room 88  Oxygen at 2 liter 92

## 2017-03-21 NOTE — Progress Notes (Signed)
Pt rolling head side to side and moaning   Pupils large   Pt is able to tell us her name

## 2017-03-21 NOTE — Anesthesia Procedure Notes (Signed)
Procedure Name: LMA Insertion Date/Time: 03/21/2017 12:30 PM Performed by: Marsh Dolly, CRNA Pre-anesthesia Checklist: Patient identified, Patient being monitored, Timeout performed, Emergency Drugs available and Suction available Patient Re-evaluated:Patient Re-evaluated prior to induction Oxygen Delivery Method: Circle system utilized Preoxygenation: Pre-oxygenation with 100% oxygen Induction Type: IV induction Ventilation: Mask ventilation without difficulty LMA: LMA inserted LMA Size: 4.0 Tube type: Oral Number of attempts: 1 Placement Confirmation: positive ETCO2 and breath sounds checked- equal and bilateral Tube secured with: Tape Dental Injury: Teeth and Oropharynx as per pre-operative assessment

## 2017-03-21 NOTE — Anesthesia Postprocedure Evaluation (Signed)
Anesthesia Post Note  Patient: Krista Cisneros  Procedure(s) Performed: BREAST LUMPECTOMY WITH NEEDLE LOCALIZATION (Right Breast) SENTINEL NODE BIOPSY (Right Breast)  Patient location during evaluation: PACU Anesthesia Type: General Level of consciousness: awake and alert Pain management: pain level controlled Vital Signs Assessment: post-procedure vital signs reviewed and stable Respiratory status: spontaneous breathing and respiratory function stable Cardiovascular status: stable Anesthetic complications: no     Last Vitals:  Vitals:   03/21/17 0944 03/21/17 1430  BP: (!) 156/69 139/62  Pulse: 87 89  Resp: 16 10  Temp: (!) 36.2 C (!) 35.7 C  SpO2: 98% 99%    Last Pain:  Vitals:   03/21/17 0944  TempSrc: Tympanic  PainSc: 0-No pain                 Griffen Frayne K

## 2017-03-21 NOTE — Progress Notes (Signed)
Speech a little garbled

## 2017-03-21 NOTE — Progress Notes (Signed)
Percocet one tablet given for pain level of 6 out of 10

## 2017-03-21 NOTE — Progress Notes (Signed)
Dr Kayleen Memos called to let him know about rolling head side to side   Pupils large  However pt more alert then she was earlier   Told by dr Kayleen Memos to hold on to pt in pacu for awhile

## 2017-03-21 NOTE — Anesthesia Post-op Follow-up Note (Signed)
Anesthesia QCDR form completed.        

## 2017-03-21 NOTE — Op Note (Signed)
Pre-operative Diagnosis: Right breast cancer  Post-operative Diagnosis: Same  Procedure performed: Sentinel lymph node biopsy of right axilla, needle localized excision of prior cancer sites to the breast 2  Surgeon: Clayburn Pert   Assistants: None  Anesthesia: General LMA anesthesia  ASA Class: 2  Surgeon: Clayburn Pert, MD FACS  Anesthesia: Gen. with endotracheal tube  Assistant: None  Procedure Details  The patient was seen again in the Holding Room. The benefits, complications, treatment options, and expected outcomes were discussed with the patient. The risks of bleeding, infection, recurrence of symptoms, failure to completely remove cancer,  nerve injury, any of which could require further surgery were reviewed with the patient. After going to radiology for Sentinel lymph node injection and needle localization of prior biopsy clips, the patient was taken to Operating Room, identified as Krista Cisneros and the procedure verified.  A Time Out was held and the above information confirmed.  Prior to the induction of general anesthesia, antibiotic prophylaxis was administered. VTE prophylaxis was in place. General endotracheal anesthesia was then administered and tolerated well. After the induction, methylene blue was instilled to the right periareolar region and massaged for 5 minutes. Next the right chest and neck was prepped with Chloraprep and draped in the sterile fashion. The patient was positioned in the supine position.  The procedure began with the sentinel lymph node biopsy. An incision was planned using the gamma probe and the entire area was localized with a 50-50 mixture 1% lidocaine 0.5% Marcaine plain. Skin was incised with a blade scalpel and using Bovie electrocautery and blunt dissection was taken down into the axillary fat. Using the gamma probe as a guide and area of maximal radioactivity was identified. It was then controlled with a figure-of-eight silk  stitch as a retractor and circumferentially excised with electrocautery. An additional palpable area of lymphadenopathy was identified and removed as well and the aforementioned technique. There were no additional areas of palpable, visible, radioactive lymphadenopathy within the axilla.  The previously placed wires were in an atypical location entering the breast from the infraclavicular region. They're unable be palpated or visualized with ultrasound entering the breast tissue. Given the infraclavicular insertion point they were unable to be accessed through a traditional incision site on the breast proper. Therefore the axillary incision was extended medially and the dissection was taken down in the direction of the wires were found be palpated. There were noted to enter into the in the medial wire traversed almost completely below the fascia of the pectoralis muscle.  The lateral wire was brought into our operative field and all of the breast and subcutaneous tissue around it was excised. There were no atypical feelings on palpation and appeared to be normal breast tissue. Again the wire terminated within the pectoralis muscle. The medial wire was then identified by palpating the pectoralis muscle and was found to terminate on the lateral aspect of the muscle. Once the terminal edge of the wire was identified all the tissue around it was circumferentially excised and passed off the field as a specimen. There is no obvious clip or abnormality within the tissue. Given the difficulty in ascertaining the location of the wires during the operation the medial, inferior, lateral, superior aspect of our excision sites for both wires was marked with a medium clip applier for radiographic identification at a later date.  The entire operative field was again palpated and there were no unusual or atypical findings on palpation. The entire field was copiously  irrigated with sterile water. We then re-localized the  entire field with the aforementioned local anesthetic. The incision was closed in layers first with a deep interrupted 2-0 Vicryl suture. Then in interrupted dermal 3-0 Vicryl suture. The skin was closed with a running subcuticular 4-0 Monocryl and then sealed with Dermabond.  The patient tolerated the procedure well and was awoken from general anesthesia and transferred to the PACU in good condition. There were no immediate complications. All counts are correct at the end of the procedure.  Findings: One radioactive sentinel lymph node, one palpable axillary lymph node, medial and lateral breast tissue at the end of radioactive wire localization.   Estimated Blood Loss: 30 mL         Drains: None         Specimens: Axillary lymph nodes 2, breast tissue specimens 2.          Complications: Inability to access the wires from a standard incision secondary to the suboptimal insertion points                  Condition: Good   Clayburn Pert, MD, FACS

## 2017-03-21 NOTE — Transfer of Care (Signed)
Immediate Anesthesia Transfer of Care Note  Patient: Krista Cisneros  Procedure(s) Performed: BREAST LUMPECTOMY WITH NEEDLE LOCALIZATION (Right Breast) SENTINEL NODE BIOPSY (Right Breast)  Patient Location: PACU  Anesthesia Type:General  Level of Consciousness: awake, alert  and oriented  Airway & Oxygen Therapy: Patient Spontanous Breathing and Patient connected to face mask oxygen  Post-op Assessment: Report given to RN and Post -op Vital signs reviewed and stable  Post vital signs: Reviewed and stable  Last Vitals:  Vitals:   03/21/17 0944  BP: (!) 156/69  Pulse: 87  Resp: 16  Temp: (!) 36.2 C  SpO2: 98%    Last Pain:  Vitals:   03/21/17 0944  TempSrc: Tympanic  PainSc: 0-No pain         Complications: No apparent anesthesia complications

## 2017-03-21 NOTE — Progress Notes (Signed)
More alert and coherent    Sat on room air 92 to 93

## 2017-03-21 NOTE — Progress Notes (Signed)
Becoming more alert  Daughter at bedside

## 2017-03-21 NOTE — Brief Op Note (Signed)
03/21/2017  2:30 PM  PATIENT:  Krista Cisneros  76 y.o. female  PRE-OPERATIVE DIAGNOSIS:  MALIGNANT NEOPLASM OF RIGHT BREAST  POST-OPERATIVE DIAGNOSIS:  MALIGNANT NEOPLASM OF RIGHT BREAST  PROCEDURE:  Procedure(s): BREAST LUMPECTOMY WITH NEEDLE LOCALIZATION (Right) SENTINEL NODE BIOPSY (Right)  SURGEON:  Surgeon(s) and Role:    Clayburn Pert, MD - Primary  PHYSICIAN ASSISTANT:   ASSISTANTS: none   ANESTHESIA:   general  EBL:  10 mL   BLOOD ADMINISTERED:none  DRAINS: none   LOCAL MEDICATIONS USED:  MARCAINE   , XYLOCAINE  and Amount: 20 ml  SPECIMEN:  Source of Specimen:  Right axillary sentinel node, medial and lateral wire localized tissue  DISPOSITION OF SPECIMEN:  PATHOLOGY  COUNTS:  YES  TOURNIQUET:  * No tourniquets in log *  DICTATION: .Dragon Dictation  PLAN OF CARE: Discharge to home after PACU  PATIENT DISPOSITION:  PACU - hemodynamically stable.   Delay start of Pharmacological VTE agent (>24hrs) due to surgical blood loss or risk of bleeding: not applicable

## 2017-03-21 NOTE — Interval H&P Note (Signed)
History and Physical Interval Note:  03/21/2017 9:59 AM  Krista Cisneros  has presented today for surgery, with the diagnosis of MALIGNANT NEOPLASM OF RIGHT BREAST  The various methods of treatment have been discussed with the patient and family. After consideration of risks, benefits and other options for treatment, the patient has consented to  Procedure(s): BREAST LUMPECTOMY WITH NEEDLE LOCALIZATION (Right) SENTINEL NODE BIOPSY (Right) as a surgical intervention .  The patient's history has been reviewed, patient examined, no change in status, stable for surgery.  I have reviewed the patient's chart and labs.  Questions were answered to the patient's satisfaction.     Clayburn Pert

## 2017-03-21 NOTE — Anesthesia Preprocedure Evaluation (Addendum)
Anesthesia Evaluation  Patient identified by MRN, date of birth, ID band Patient awake    Reviewed: Allergy & Precautions, NPO status , Patient's Chart, lab work & pertinent test results  History of Anesthesia Complications Negative for: history of anesthetic complications  Airway Mallampati: III       Dental  (+) Lower Dentures, Upper Dentures   Pulmonary neg sleep apnea, COPD,  COPD inhaler, former smoker,           Cardiovascular hypertension, Pt. on medications + Peripheral Vascular Disease  (-) Past MI and (-) CHF (-) dysrhythmias (-) Valvular Problems/Murmurs     Neuro/Psych neg Seizures Anxiety Depression    GI/Hepatic Neg liver ROS, GERD  Medicated and Controlled,  Endo/Other  neg diabetes  Renal/GU negative Renal ROS     Musculoskeletal   Abdominal   Peds  Hematology   Anesthesia Other Findings   Reproductive/Obstetrics                            Anesthesia Physical Anesthesia Plan  ASA: III  Anesthesia Plan: General   Post-op Pain Management:    Induction: Intravenous  PONV Risk Score and Plan: 3 and Dexamethasone, Ondansetron and Midazolam  Airway Management Planned: LMA  Additional Equipment:   Intra-op Plan:   Post-operative Plan:   Informed Consent: I have reviewed the patients History and Physical, chart, labs and discussed the procedure including the risks, benefits and alternatives for the proposed anesthesia with the patient or authorized representative who has indicated his/her understanding and acceptance.     Plan Discussed with:   Anesthesia Plan Comments:         Anesthesia Quick Evaluation

## 2017-03-22 ENCOUNTER — Encounter: Payer: Self-pay | Admitting: General Surgery

## 2017-03-23 LAB — SURGICAL PATHOLOGY

## 2017-03-26 ENCOUNTER — Observation Stay
Admission: EM | Admit: 2017-03-26 | Discharge: 2017-03-28 | Disposition: A | Discharge status: Home / Self Care | Payer: Medicare HMO | Attending: Surgery | Admitting: Surgery

## 2017-03-26 ENCOUNTER — Other Ambulatory Visit: Payer: Self-pay

## 2017-03-26 ENCOUNTER — Emergency Department: Payer: Medicare HMO | Admitting: Anesthesiology

## 2017-03-26 ENCOUNTER — Encounter
Admission: EM | Disposition: A | Discharge status: Home / Self Care | Payer: Self-pay | Source: Home / Self Care | Attending: Emergency Medicine

## 2017-03-26 ENCOUNTER — Emergency Department: Payer: Medicare HMO

## 2017-03-26 DIAGNOSIS — J449 Chronic obstructive pulmonary disease, unspecified: Secondary | ICD-10-CM | POA: Insufficient documentation

## 2017-03-26 DIAGNOSIS — K460 Unspecified abdominal hernia with obstruction, without gangrene: Secondary | ICD-10-CM | POA: Diagnosis present

## 2017-03-26 DIAGNOSIS — I10 Essential (primary) hypertension: Secondary | ICD-10-CM | POA: Diagnosis not present

## 2017-03-26 DIAGNOSIS — R1031 Right lower quadrant pain: Secondary | ICD-10-CM | POA: Diagnosis present

## 2017-03-26 DIAGNOSIS — K219 Gastro-esophageal reflux disease without esophagitis: Secondary | ICD-10-CM | POA: Insufficient documentation

## 2017-03-26 DIAGNOSIS — K4030 Unilateral inguinal hernia, with obstruction, without gangrene, not specified as recurrent: Principal | ICD-10-CM | POA: Insufficient documentation

## 2017-03-26 DIAGNOSIS — F419 Anxiety disorder, unspecified: Secondary | ICD-10-CM | POA: Insufficient documentation

## 2017-03-26 DIAGNOSIS — Z853 Personal history of malignant neoplasm of breast: Secondary | ICD-10-CM | POA: Diagnosis not present

## 2017-03-26 DIAGNOSIS — Z79899 Other long term (current) drug therapy: Secondary | ICD-10-CM | POA: Insufficient documentation

## 2017-03-26 DIAGNOSIS — F329 Major depressive disorder, single episode, unspecified: Secondary | ICD-10-CM | POA: Diagnosis not present

## 2017-03-26 DIAGNOSIS — Z87891 Personal history of nicotine dependence: Secondary | ICD-10-CM | POA: Insufficient documentation

## 2017-03-26 HISTORY — PX: INGUINAL HERNIA REPAIR: SHX194

## 2017-03-26 LAB — COMPREHENSIVE METABOLIC PANEL
ALK PHOS: 74 U/L (ref 38–126)
ALT: 14 U/L (ref 14–54)
AST: 28 U/L (ref 15–41)
Albumin: 3 g/dL — ABNORMAL LOW (ref 3.5–5.0)
Anion gap: 11 (ref 5–15)
BUN: 17 mg/dL (ref 6–20)
CALCIUM: 9 mg/dL (ref 8.9–10.3)
CHLORIDE: 102 mmol/L (ref 101–111)
CO2: 24 mmol/L (ref 22–32)
CREATININE: 1.1 mg/dL — AB (ref 0.44–1.00)
GFR, EST AFRICAN AMERICAN: 55 mL/min — AB (ref >60)
GFR, EST NON AFRICAN AMERICAN: 48 mL/min — AB (ref >60)
Glucose, Bld: 156 mg/dL — ABNORMAL HIGH (ref 65–99)
Potassium: 3.3 mmol/L — ABNORMAL LOW (ref 3.5–5.1)
Sodium: 137 mmol/L (ref 135–145)
Total Bilirubin: 0.7 mg/dL (ref 0.3–1.2)
Total Protein: 7.1 g/dL (ref 6.5–8.1)

## 2017-03-26 LAB — CBC
HCT: 37.2 % (ref 35.0–47.0)
Hemoglobin: 12.2 g/dL (ref 12.0–16.0)
MCH: 31.9 pg (ref 26.0–34.0)
MCHC: 32.7 g/dL (ref 32.0–36.0)
MCV: 97.4 fL (ref 80.0–100.0)
PLATELETS: 270 10*3/uL (ref 150–440)
RBC: 3.82 MIL/uL (ref 3.80–5.20)
RDW: 17.7 % — AB (ref 11.5–14.5)
WBC: 13 10*3/uL — AB (ref 3.6–11.0)

## 2017-03-26 LAB — LIPASE, BLOOD: LIPASE: 16 U/L (ref 11–51)

## 2017-03-26 IMAGING — CT CT ABD-PELV W/ CM
2 of 5 series · 15 of 46 positions shown, 17 images · IV contrast (APPLIED)
Comparison: [DATE]

CLINICAL DATA: Lower abdominal pain starting 1 hour prior to
admission. Nausea. Denies vomiting or diarrhea. History of breast
cancer.

EXAM:
CT ABDOMEN AND PELVIS WITH CONTRAST
TECHNIQUE: Multidetector CT imaging of the abdomen and pelvis was performed
using the standard protocol following bolus administration of
intravenous contrast.
CONTRAST:  100mL [LV] IOPAMIDOL ([LV]) INJECTION 61%

[Series 2: routine abd/pel with · axial · 0.69mm/px · z∈[-1262,-867]mm · 12 of 89 slices shown, 14 images]
[im 5/89  soft-tissue]
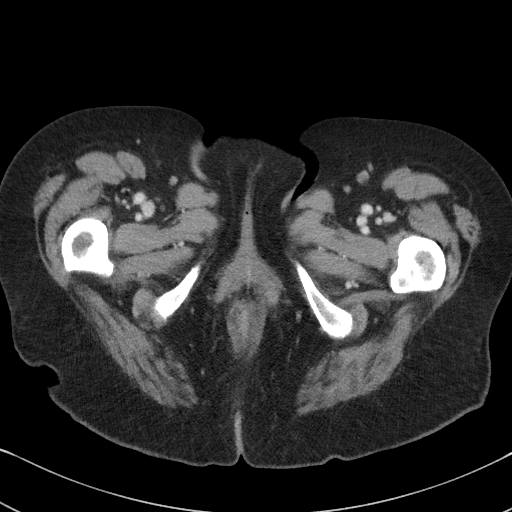
[im 5/89  bone]
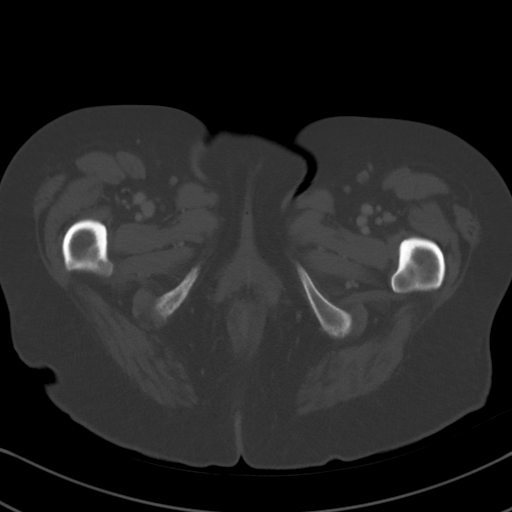
[im 14/89  soft-tissue]
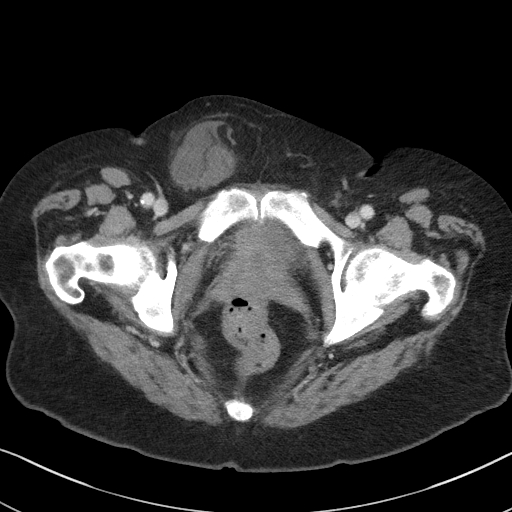
[im 19/89  soft-tissue]
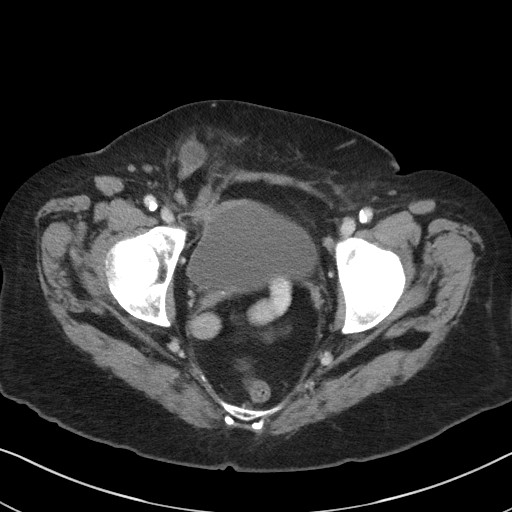
[im 28/89  soft-tissue]
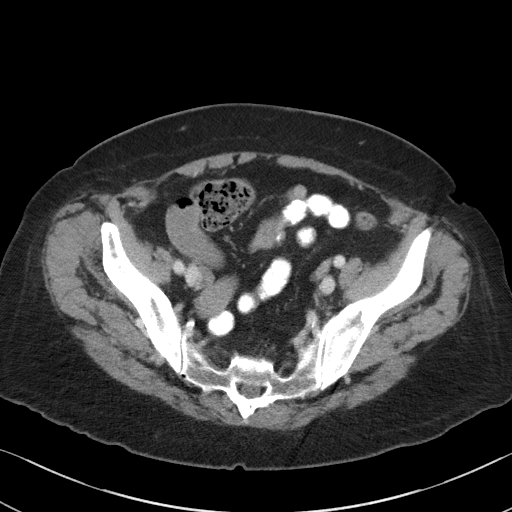
[im 33/89  soft-tissue]
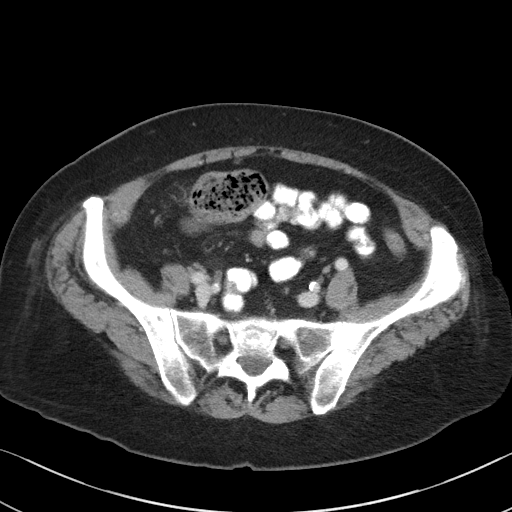
[im 42/89  soft-tissue]
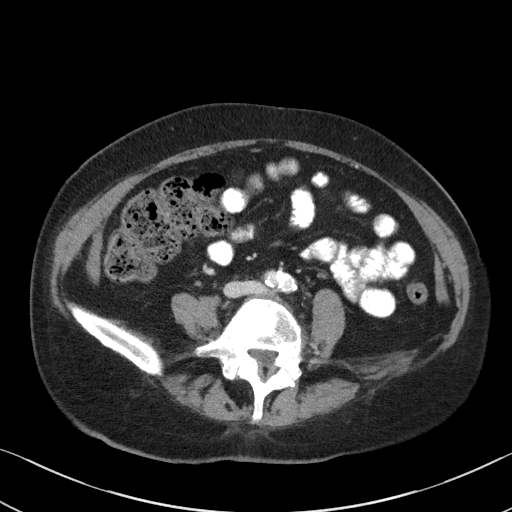
[im 47/89  soft-tissue]
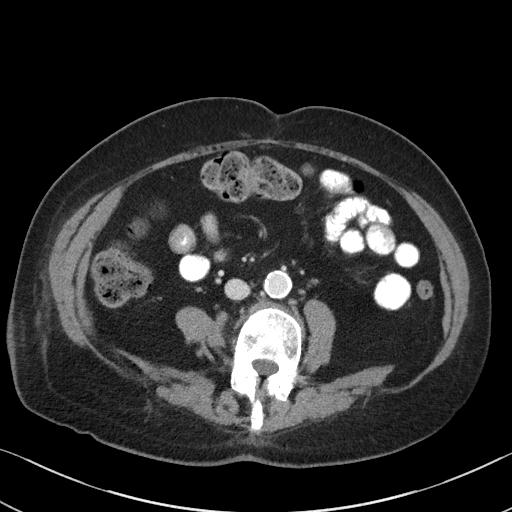
[im 56/89  soft-tissue]
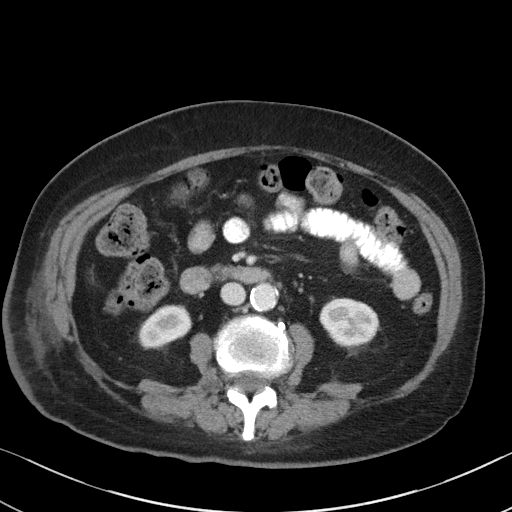
[im 61/89  soft-tissue]
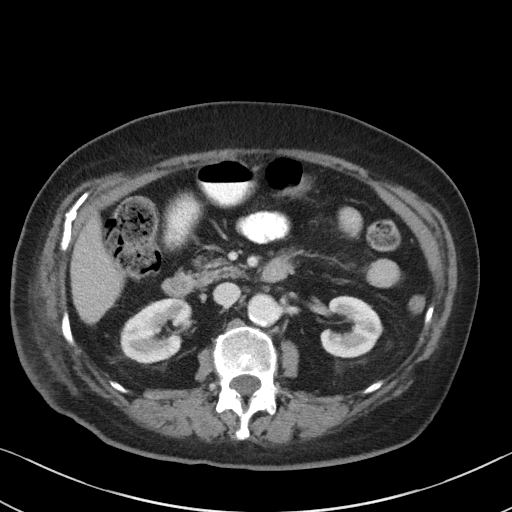
[im 61/89  bone]
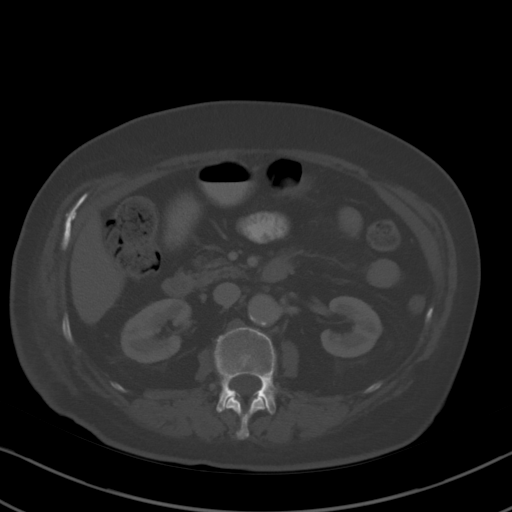
[im 70/89  soft-tissue]
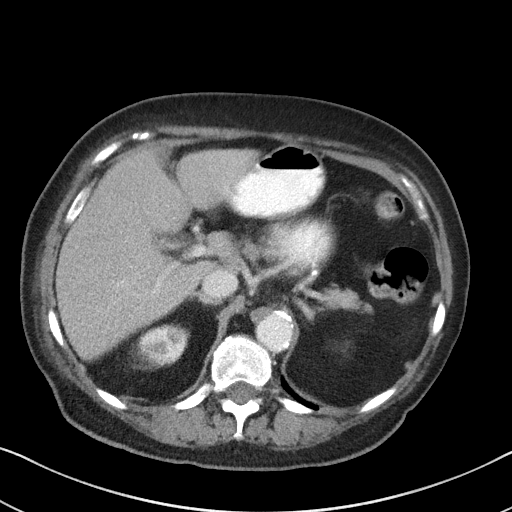
[im 75/89  soft-tissue]
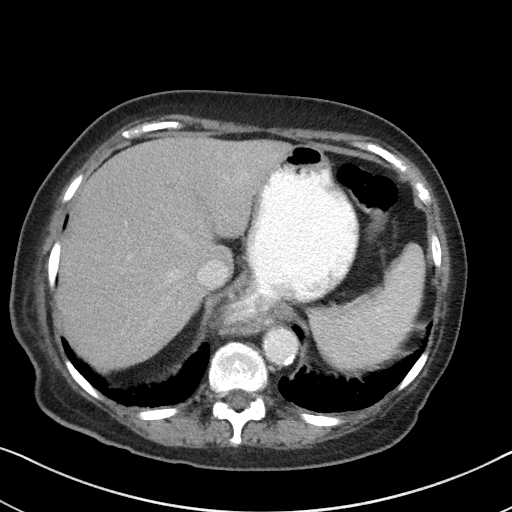
[im 84/89  soft-tissue]
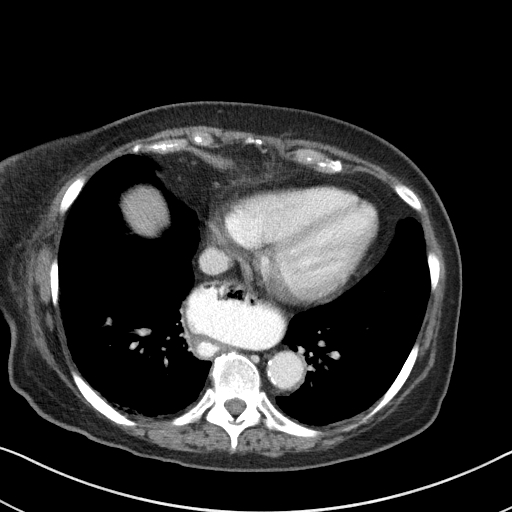

[Series 5: coronal st · coronal · 0.69mm/px · 3 of 84 slices shown]
[im 28/84  soft-tissue]
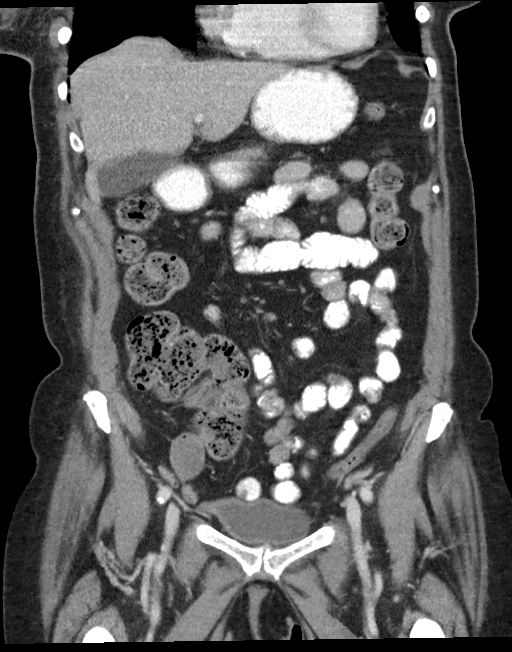
[im 37/84  soft-tissue]
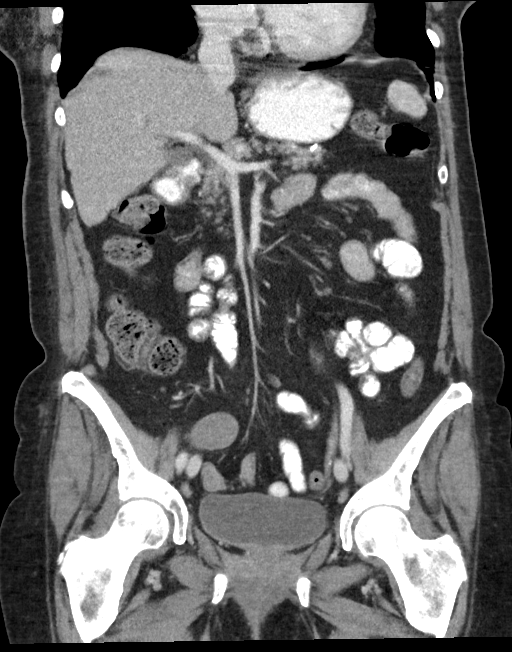
[im 47/84  soft-tissue]
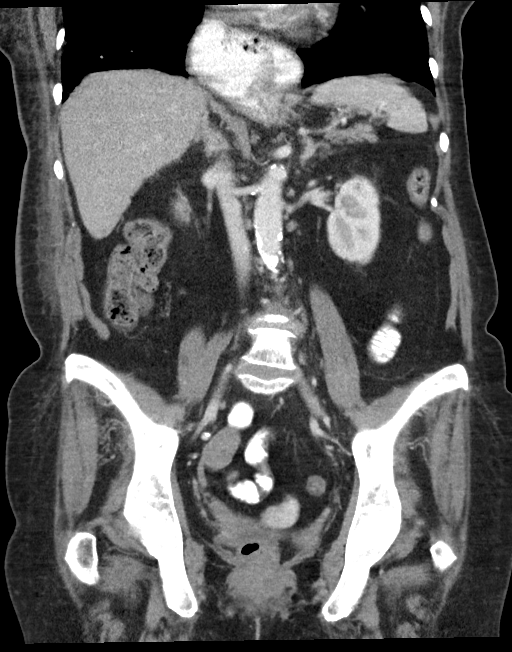

[15 of 46 positions shown; findings below may reference images not displayed]

FINDINGS: Lower chest: Normal size heart without pericardial effusion.
Moderate-sized hiatal hernia. Atelectasis at each lung base.

Hepatobiliary: No focal liver abnormality is seen. No gallstones,
gallbladder wall thickening, or biliary dilatation.

Pancreas: Unremarkable. No pancreatic ductal dilatation or
surrounding inflammatory changes.

Spleen: Normal in size without focal abnormality.

Adrenals/Urinary Tract: Normal bilateral adrenal glands. Right upper
pole simple appearing renal cyst measuring 2 cm in diameter. No
nephrolithiasis or obstructive uropathy. The urinary bladder is
unremarkable.

Stomach/Bowel: Omental fat, fluid and small bowel containing right
inguinal hernia with fluid distention seen within the herniated loop
of small intestine up to 2.3 cm in caliber. Correlate to determine
whether this can be reduced as incarceration is not excluded given
the fluid distention. Normal small bowel rotation without small
bowel obstruction. Moderate fecal residue throughout the colon.

Vascular/Lymphatic: Moderate aortoiliac atherosclerosis. No
aneurysm.

Reproductive: Status post hysterectomy. No adnexal masses.

Other: No free air.  No abdominopelvic ascites.

Musculoskeletal: Degenerative disc disease L4-5 with discogenic
sclerosis of the endplates. Mild levoconvex curvature of the lumbar
spine.
IMPRESSION: 1. Right-sided inguinal hernia containing omental fat, fluid and a
short segment of small intestine. Fluid-filled distention of the
herniated small-bowel is noted. This raises concern for possible
small bowel incarceration.
2. 2 cm right upper pole simple renal cyst, stable since prior.
3. Moderate-sized hiatal hernia.
4. Aortoiliac atherosclerosis without aneurysm.
5. Levoscoliosis with discogenic endplate sclerosis secondary to
moderate-to-marked degenerative disc disease at L4-5.

## 2017-03-26 SURGERY — REPAIR, HERNIA, INGUINAL, INCARCERATED
Anesthesia: General | Laterality: Right

## 2017-03-26 MED ORDER — ROCURONIUM BROMIDE 50 MG/5ML IV SOLN
INTRAVENOUS | Status: AC
  Filled 2017-03-26: qty 1

## 2017-03-26 MED ORDER — SUGAMMADEX SODIUM 200 MG/2ML IV SOLN
INTRAVENOUS | Status: AC
  Filled 2017-03-26: qty 2

## 2017-03-26 MED ORDER — ONDANSETRON HCL 4 MG/2ML IJ SOLN
4.0000 mg | Freq: Once | INTRAMUSCULAR | Status: AC
  Administered 2017-03-26: 4 mg via INTRAVENOUS
  Filled 2017-03-26: qty 2

## 2017-03-26 MED ORDER — SUGAMMADEX SODIUM 500 MG/5ML IV SOLN
INTRAVENOUS | Status: DC | PRN
  Administered 2017-03-26: 150 mg via INTRAVENOUS

## 2017-03-26 MED ORDER — PROMETHAZINE HCL 25 MG/ML IJ SOLN: 6.2500 mg | INTRAMUSCULAR | Status: DC | PRN

## 2017-03-26 MED ORDER — MEPERIDINE HCL 50 MG/ML IJ SOLN: 6.2500 mg | INTRAMUSCULAR | Status: DC | PRN

## 2017-03-26 MED ORDER — MIDAZOLAM HCL 2 MG/2ML IJ SOLN
INTRAMUSCULAR | Status: DC | PRN
  Administered 2017-03-26: 1 mg via INTRAVENOUS

## 2017-03-26 MED ORDER — PROPOFOL 10 MG/ML IV BOLUS
INTRAVENOUS | Status: AC
  Filled 2017-03-26: qty 20

## 2017-03-26 MED ORDER — FENTANYL CITRATE (PF) 100 MCG/2ML IJ SOLN
INTRAMUSCULAR | Status: DC | PRN
  Administered 2017-03-26: 50 ug via INTRAVENOUS

## 2017-03-26 MED ORDER — FENTANYL CITRATE (PF) 100 MCG/2ML IJ SOLN
INTRAMUSCULAR | Status: AC
  Filled 2017-03-26: qty 2

## 2017-03-26 MED ORDER — SUCCINYLCHOLINE CHLORIDE 20 MG/ML IJ SOLN
INTRAMUSCULAR | Status: DC | PRN
  Administered 2017-03-26: 100 mg via INTRAVENOUS

## 2017-03-26 MED ORDER — CLINDAMYCIN PHOSPHATE 900 MG/50ML IV SOLN
900.0000 mg | Freq: Once | INTRAVENOUS | Status: AC
  Administered 2017-03-26: 900 mg via INTRAVENOUS
  Filled 2017-03-26: qty 50

## 2017-03-26 MED ORDER — FENTANYL CITRATE (PF) 100 MCG/2ML IJ SOLN: 25.0000 ug | INTRAMUSCULAR | Status: DC | PRN

## 2017-03-26 MED ORDER — OXYCODONE HCL 5 MG PO TABS: 5.0000 mg | ORAL_TABLET | Freq: Once | ORAL | Status: DC | PRN

## 2017-03-26 MED ORDER — MIDAZOLAM HCL 2 MG/2ML IJ SOLN
INTRAMUSCULAR | Status: AC
  Filled 2017-03-26: qty 2

## 2017-03-26 MED ORDER — HYDROMORPHONE HCL 1 MG/ML IJ SOLN
0.5000 mg | Freq: Once | INTRAMUSCULAR | Status: AC
  Administered 2017-03-26: 0.5 mg via INTRAVENOUS
  Filled 2017-03-26: qty 1

## 2017-03-26 MED ORDER — DEXAMETHASONE SODIUM PHOSPHATE 10 MG/ML IJ SOLN
INTRAMUSCULAR | Status: AC
  Filled 2017-03-26: qty 1

## 2017-03-26 MED ORDER — LIDOCAINE HCL (CARDIAC) 20 MG/ML IV SOLN
INTRAVENOUS | Status: DC | PRN
  Administered 2017-03-26: 100 mg via INTRAVENOUS

## 2017-03-26 MED ORDER — BUPIVACAINE-EPINEPHRINE (PF) 0.25% -1:200000 IJ SOLN
INTRAMUSCULAR | Status: AC
  Filled 2017-03-26: qty 30

## 2017-03-26 MED ORDER — LIDOCAINE HCL (PF) 2 % IJ SOLN
INTRAMUSCULAR | Status: AC
  Filled 2017-03-26: qty 10

## 2017-03-26 MED ORDER — ONDANSETRON HCL 4 MG/2ML IJ SOLN
INTRAMUSCULAR | Status: DC | PRN
Start: 2017-03-26 — End: 2017-03-27
  Administered 2017-03-26: 4 mg via INTRAVENOUS

## 2017-03-26 MED ORDER — LACTATED RINGERS IV SOLN
INTRAVENOUS | Status: DC | PRN
  Administered 2017-03-26: 23:00:00 via INTRAVENOUS

## 2017-03-26 MED ORDER — POTASSIUM CHLORIDE 10 MEQ/100ML IV SOLN
10.0000 meq | INTRAVENOUS | Status: AC
Start: 2017-03-26 — End: 2017-03-27
  Administered 2017-03-26 (×2): 10 meq via INTRAVENOUS
  Filled 2017-03-26 (×2): qty 100

## 2017-03-26 MED ORDER — PROPOFOL 10 MG/ML IV BOLUS
INTRAVENOUS | Status: DC | PRN
  Administered 2017-03-26: 130 mg via INTRAVENOUS

## 2017-03-26 MED ORDER — DEXAMETHASONE SODIUM PHOSPHATE 10 MG/ML IJ SOLN
INTRAMUSCULAR | Status: DC | PRN
  Administered 2017-03-26: 10 mg via INTRAVENOUS

## 2017-03-26 MED ORDER — SODIUM CHLORIDE 0.9 % IV BOLUS (SEPSIS)
1000.0000 mL | Freq: Once | INTRAVENOUS | Status: AC
  Administered 2017-03-26: 1000 mL via INTRAVENOUS

## 2017-03-26 MED ORDER — ONDANSETRON HCL 4 MG/2ML IJ SOLN
INTRAMUSCULAR | Status: AC
  Filled 2017-03-26: qty 2

## 2017-03-26 MED ORDER — IOPAMIDOL (ISOVUE-300) INJECTION 61%
100.0000 mL | Freq: Once | INTRAVENOUS | Status: AC | PRN
  Administered 2017-03-26: 100 mL via INTRAVENOUS

## 2017-03-26 MED ORDER — BUPIVACAINE-EPINEPHRINE 0.25% -1:200000 IJ SOLN
INTRAMUSCULAR | Status: DC | PRN
  Administered 2017-03-26: 30 mL

## 2017-03-26 MED ORDER — OXYCODONE HCL 5 MG/5ML PO SOLN: 5.0000 mg | Freq: Once | ORAL | Status: DC | PRN

## 2017-03-26 MED ORDER — ROCURONIUM BROMIDE 100 MG/10ML IV SOLN
INTRAVENOUS | Status: DC | PRN
  Administered 2017-03-26: 30 mg via INTRAVENOUS

## 2017-03-26 MED ORDER — PHENYLEPHRINE HCL 10 MG/ML IJ SOLN
INTRAMUSCULAR | Status: DC | PRN
  Administered 2017-03-26: 100 ug via INTRAVENOUS
  Administered 2017-03-26: 50 ug via INTRAVENOUS
  Administered 2017-03-26: 100 ug via INTRAVENOUS

## 2017-03-26 MED ORDER — IOPAMIDOL (ISOVUE-300) INJECTION 61%
30.0000 mL | Freq: Once | INTRAVENOUS | Status: AC | PRN
  Administered 2017-03-26: 30 mL via ORAL

## 2017-03-26 SURGICAL SUPPLY — 28 items
BLADE CLIPPER SURG (BLADE) ×2 IMPLANT
CANISTER SUCT 1200ML W/VALVE (MISCELLANEOUS) ×2 IMPLANT
CHLORAPREP W/TINT 26ML (MISCELLANEOUS) ×2 IMPLANT
DERMABOND ADVANCED (GAUZE/BANDAGES/DRESSINGS) ×1
DERMABOND ADVANCED .7 DNX12 (GAUZE/BANDAGES/DRESSINGS) ×1 IMPLANT
DRAIN PENROSE 5/8X18 LTX STRL (WOUND CARE) IMPLANT
DRAPE INCISE IOBAN 66X45 STRL (DRAPES) IMPLANT
DRAPE LAPAROTOMY 77X122 PED (DRAPES) ×2 IMPLANT
ELECT CAUTERY BLADE 6.4 (BLADE) ×2 IMPLANT
ELECT REM PT RETURN 9FT ADLT (ELECTROSURGICAL) ×2
ELECTRODE REM PT RTRN 9FT ADLT (ELECTROSURGICAL) ×1 IMPLANT
GLOVE BIO SURGEON STRL SZ7 (GLOVE) ×6 IMPLANT
GOWN STRL REUS W/ TWL LRG LVL3 (GOWN DISPOSABLE) ×2 IMPLANT
GOWN STRL REUS W/TWL LRG LVL3 (GOWN DISPOSABLE) ×2
NEEDLE HYPO 22GX1.5 SAFETY (NEEDLE) ×2 IMPLANT
NS IRRIG 1000ML POUR BTL (IV SOLUTION) ×2 IMPLANT
PACK BASIN MINOR ARMC (MISCELLANEOUS) ×2 IMPLANT
SLEEVE SCD COMPRESS THIGH MED (MISCELLANEOUS) ×2 IMPLANT
SPONGE KITTNER 5P (MISCELLANEOUS) IMPLANT
SPONGE LAP 18X18 5 PK (GAUZE/BANDAGES/DRESSINGS) ×2 IMPLANT
SUT ETHIBOND NAB MO 7 #0 18IN (SUTURE) ×4 IMPLANT
SUT MNCRL AB 4-0 PS2 18 (SUTURE) ×2 IMPLANT
SUT VIC AB 3-0 54X BRD REEL (SUTURE) ×1 IMPLANT
SUT VIC AB 3-0 BRD 54 (SUTURE) ×1
SUT VIC AB 3-0 SH 27 (SUTURE) ×1
SUT VIC AB 3-0 SH 27X BRD (SUTURE) ×1 IMPLANT
SYR 20CC LL (SYRINGE) ×4 IMPLANT
TRAY FOLEY W/METER SILVER 16FR (SET/KITS/TRAYS/PACK) ×2 IMPLANT

## 2017-03-26 NOTE — Transfer of Care (Signed)
Immediate Anesthesia Transfer of Care Note  Patient: Krista Cisneros  Procedure(s) Performed: HERNIA REPAIR INGUINAL INCARCERATED (Right )  Patient Location: PACU  Anesthesia Type:General  Level of Consciousness: sedated and patient cooperative  Airway & Oxygen Therapy: Patient Spontanous Breathing and Patient connected to nasal cannula oxygen  Post-op Assessment: Report given to RN and Post -op Vital signs reviewed and stable  Post vital signs: Reviewed and stable  Last Vitals:  Vitals:   03/26/17 2200 03/26/17 2353  BP: (!) 178/78 (!) 148/68  Pulse: 86 99  Resp: 13 14  Temp:    SpO2: 95% 93%    Last Pain:  Vitals:   03/26/17 2133  TempSrc:   PainSc: 5          Complications: No apparent anesthesia complications

## 2017-03-26 NOTE — Anesthesia Procedure Notes (Signed)
Procedure Name: Intubation Date/Time: 03/26/2017 10:56 PM Performed by: Lendon Colonel, CRNA Pre-anesthesia Checklist: Patient identified, Patient being monitored, Timeout performed, Emergency Drugs available and Suction available Patient Re-evaluated:Patient Re-evaluated prior to induction Oxygen Delivery Method: Circle system utilized Preoxygenation: Pre-oxygenation with 100% oxygen Induction Type: IV induction Laryngoscope Size: Miller and 2 Grade View: Grade I Tube type: Oral Tube size: 7.0 mm Number of attempts: 1 Airway Equipment and Method: Stylet Placement Confirmation: ETT inserted through vocal cords under direct vision,  positive ETCO2 and breath sounds checked- equal and bilateral Secured at: 20 cm Tube secured with: Tape Dental Injury: Teeth and Oropharynx as per pre-operative assessment

## 2017-03-26 NOTE — Anesthesia Preprocedure Evaluation (Signed)
Anesthesia Evaluation  Patient identified by MRN, date of birth, ID band Patient awake    Reviewed: Allergy & Precautions, NPO status , Patient's Chart, lab work & pertinent test results  History of Anesthesia Complications Negative for: history of anesthetic complications  Airway Mallampati: II  TM Distance: >3 FB Neck ROM: Full    Dental  (+) Upper Dentures, Lower Dentures   Pulmonary neg sleep apnea, COPD,  COPD inhaler, former smoker,    breath sounds clear to auscultation- rhonchi (-) wheezing      Cardiovascular hypertension, Pt. on medications (-) CAD, (-) Past MI and (-) Cardiac Stents  Rhythm:Regular Rate:Normal - Systolic murmurs and - Diastolic murmurs    Neuro/Psych PSYCHIATRIC DISORDERS Anxiety Depression negative neurological ROS     GI/Hepatic Neg liver ROS, GERD  ,Incarcerated hernia   Endo/Other  negative endocrine ROSneg diabetes  Renal/GU Renal InsufficiencyRenal disease     Musculoskeletal  (+) Arthritis ,   Abdominal (+) - obese,   Peds  Hematology negative hematology ROS (+)   Anesthesia Other Findings Past Medical History: No date: Anxiety No date: Arthritis No date: Cancer [facility])     Comment:  Right Breast Cancer No date: COPD (chronic obstructive pulmonary disease) (HCC) No date: Depression No date: Dyspnea     Comment:  with exertion No date: GERD (gastroesophageal reflux disease) No date: Hyperlipidemia No date: Hypertension   Reproductive/Obstetrics                             Lab Results  Component Value Date   WBC 13.0 (H) 03/26/2017   HGB 12.2 03/26/2017   HCT 37.2 03/26/2017   MCV 97.4 03/26/2017   PLT 270 03/26/2017    Anesthesia Physical Anesthesia Plan  ASA: III and emergent  Anesthesia Plan: General   Post-op Pain Management:    Induction: Intravenous, Cricoid pressure planned and Rapid sequence  PONV Risk Score and Plan: 2 and  Dexamethasone and Ondansetron  Airway Management Planned: Oral ETT  Additional Equipment:   Intra-op Plan:   Post-operative Plan: Extubation in OR  Informed Consent: I have reviewed the patients History and Physical, chart, labs and discussed the procedure including the risks, benefits and alternatives for the proposed anesthesia with the patient or authorized representative who has indicated his/her understanding and acceptance.   Dental advisory given  Plan Discussed with: CRNA and Anesthesiologist  Anesthesia Plan Comments:         Anesthesia Quick Evaluation

## 2017-03-26 NOTE — ED Provider Notes (Signed)
[facility] Emergency Department Provider Note  ____________________________________________  Time seen: Approximately 7:45 PM  I have reviewed the triage vital signs and the nursing notes.   HISTORY  Chief Complaint Abdominal Pain    HPI Krista Cisneros is a 76 y.o. female status post lumpectomy 6 days ago with constipation and right lower quadrant abdominal pain.  The patient reports that since her surgery, she has not had a bowel movement and has not been passing gas.  Around 7 PM today, she developed an acute onset of sharp right lower quadrant pain associated with nausea but no vomiting.  No fevers or chills.  No urinary symptoms.  She has no history of abdominal surgeries in the past.  Past Medical History:  Diagnosis Date  . Anxiety   . Arthritis   . Cancer [facility])    Right Breast Cancer  . COPD (chronic obstructive pulmonary disease) ([place])   . Depression   . Dyspnea    with exertion  . GERD (gastroesophageal reflux disease)   . Hyperlipidemia   . Hypertension     Patient Active Problem List   Diagnosis Date Noted  . Incarcerated hernia 03/26/2017  . Incarcerated inguinal hernia   . Acute kidney injury ([place]) 02/07/2017  . Hypomagnesemia 01/30/2017  . Goals of care, counseling/discussion 10/20/2016  . Malignant neoplasm of upper outer quadrant of female breast ([place]) 10/16/2016  . Hyperlipidemia, mixed 10/08/2016  . Chronic venous insufficiency 09/14/2016  . GERD (gastroesophageal reflux disease) 09/14/2016  . Pain in limb 05/29/2016  . Essential hypertension 05/29/2016  . COPD (chronic obstructive pulmonary disease) ([place]) 05/29/2016  . Hardening of the aorta (main artery of the heart) ([place]) 01/07/2016  . Hyperglycemia, unspecified 01/07/2016  . Neuropathy 11/15/2015  . Severe recurrent major depression without psychotic features ([place]) 09/10/2015  . Allergic rhinitis 04/10/2014  . Depression 04/10/2014  . Obesity, unspecified  04/10/2014    Past Surgical History:  Procedure Laterality Date  . ABDOMINAL HYSTERECTOMY  1990   Partial  . BREAST BIOPSY Right 10/04/2016   axilla lymph node and axillay tail mass biopsy. invasive mammary carcinoma  . BREAST LUMPECTOMY Right 03/21/2017  . BREAST LUMPECTOMY WITH NEEDLE LOCALIZATION Right 03/21/2017   Procedure: BREAST LUMPECTOMY WITH NEEDLE LOCALIZATION;  Surgeon: Clayburn Pert, MD;  Location: [facility] ORS;  Service: General;  Laterality: Right;  . DILATION AND CURETTAGE OF UTERUS    . PORTACATH PLACEMENT Left 10/24/2016   Procedure: INSERTION PORT-A-CATH;  Surgeon: [name], MD;  Location: [facility] ORS;  Service: General;  Laterality: Left;  . SENTINEL NODE BIOPSY Right 03/21/2017   Procedure: SENTINEL NODE BIOPSY;  Surgeon: Clayburn Pert, MD;  Location: [facility] ORS;  Service: General;  Laterality: Right;      Allergies Penicillins; Atorvastatin; Gabapentin; and Ezetimibe  Family History  Problem Relation Age of Onset  . Colon cancer Mother   . Breast cancer Neg Hx     Social History Social History   Tobacco Use  . Smoking status: Former Smoker    Packs/day: 0.50    Types: Cigarettes    Last attempt to quit: 07/23/1988    Years since quitting: 28.6  . Smokeless tobacco: Never Used  Substance Use Topics  . Alcohol use: No  . Drug use: No    Review of Systems Constitutional: No fever/chills.  No lightheadedness or syncope. Eyes: No visual changes. ENT: No sore throat. No congestion or rhinorrhea. Cardiovascular: Denies chest pain. Denies palpitations. Respiratory: Denies shortness of breath.  No cough.  Gastrointestinal: Positive right lower quadrant abdominal pain.  Positive nausea, no vomiting.  No diarrhea.  Positive constipation.  Not passing flatus. Genitourinary: Negative for dysuria. Musculoskeletal: Negative for back pain. Skin: Negative for rash. Neurological: Negative for headaches. No focal numbness, tingling or weakness.      ____________________________________________   PHYSICAL EXAM:  VITAL SIGNS: ED Triage Vitals [03/26/17 1857]  Enc Vitals Group     BP (!) 131/46     Pulse Rate 61     Resp 18     Temp (!) 97.4 F (36.3 C)     Temp Source Oral     SpO2 95 %     Weight 155 lb (70.3 kg)     Height 5\' 2"  (1.575 m)     Head Circumference      Peak Flow      Pain Score 10     Pain Loc      Pain Edu?      Excl. in [place]?     Constitutional: Alert and oriented.  Comfortable appearing but in no acute distress. Answers questions appropriately. Eyes: Conjunctivae are normal.  EOMI. No scleral icterus. Head: Atraumatic. Nose: No congestion/rhinnorhea. Mouth/Throat: Mucous membranes are moist.  Neck: No stridor.  Supple.  No JVD. Cardiovascular: Normal rate, regular rhythm. No murmurs, rubs or gallops.  Respiratory: Normal respiratory effort.  No accessory muscle use or retractions. Lungs CTAB.  No wheezes, rales or ronchi. Gastrointestinal: Soft, and nondistended.  Tender to palpation in the right lower quadrant.  Negative Murphy sign.  No guarding or rebound.  No peritoneal signs. Musculoskeletal: No LE edema.  Neurologic:  A&Ox3.  Speech is clear.  Face and smile are symmetric.  EOMI.  Moves all extremities well. Skin:  Skin is warm, dry and intact. No rash noted. Psychiatric: Mood and affect are normal. Speech and behavior are normal.  Normal judgement. ____________________________________________   LABS (all labs ordered are listed, but only abnormal results are displayed)  Labs Reviewed  COMPREHENSIVE METABOLIC PANEL - Abnormal; Notable for the following components:      Result Value   Potassium 3.3 (*)    Glucose, Bld 156 (*)    Creatinine, Ser 1.10 (*)    Albumin 3.0 (*)    GFR calc non Af Amer 48 (*)    GFR calc Af Amer 55 (*)    All other components within normal limits  CBC - Abnormal; Notable for the following components:   WBC 13.0 (*)    RDW 17.7 (*)    All other  components within normal limits  LIPASE, BLOOD  URINALYSIS, COMPLETE (UACMP) WITH MICROSCOPIC  URINALYSIS, COMPLETE (UACMP) WITH MICROSCOPIC  LIPASE, BLOOD   ____________________________________________  EKG  Not indicated  ____________________________________________  RADIOLOGY  Ct Abdomen Pelvis W Contrast  Result Date: 03/26/2017 CLINICAL DATA:  Lower abdominal pain starting 1 hour prior to admission. Nausea. Denies vomiting or diarrhea. History of breast cancer. EXAM: CT ABDOMEN AND PELVIS WITH CONTRAST TECHNIQUE: Multidetector CT imaging of the abdomen and pelvis was performed using the standard protocol following bolus administration of intravenous contrast. CONTRAST:  130mL ISOVUE-300 IOPAMIDOL (ISOVUE-300) INJECTION 61% COMPARISON:  04/09/2014 FINDINGS: Lower chest: Normal size heart without pericardial effusion. Moderate-sized hiatal hernia. Atelectasis at each lung base. Hepatobiliary: No focal liver abnormality is seen. No gallstones, gallbladder wall thickening, or biliary dilatation. Pancreas: Unremarkable. No pancreatic ductal dilatation or surrounding inflammatory changes. Spleen: Normal in size without focal abnormality. Adrenals/Urinary Tract: Normal bilateral adrenal glands. Right upper  pole simple appearing renal cyst measuring 2 cm in diameter. No nephrolithiasis or obstructive uropathy. The urinary bladder is unremarkable. Stomach/Bowel: Omental fat, fluid and small bowel containing right inguinal hernia with fluid distention seen within the herniated loop of small intestine up to 2.3 cm in caliber. Correlate to determine whether this can be reduced as incarceration is not excluded given the fluid distention. Normal small bowel rotation without small bowel obstruction. Moderate fecal residue throughout the colon. Vascular/Lymphatic: Moderate aortoiliac atherosclerosis. No aneurysm. Reproductive: Status post hysterectomy. No adnexal masses. Other: No free air.  No abdominopelvic  ascites. Musculoskeletal: Degenerative disc disease L4-5 with discogenic sclerosis of the endplates. Mild levoconvex curvature of the lumbar spine. IMPRESSION: 1. Right-sided inguinal hernia containing omental fat, fluid and a short segment of small intestine. Fluid-filled distention of the herniated small-bowel is noted. This raises concern for possible small bowel incarceration. 2. 2 cm right upper pole simple renal cyst, stable since prior. 3. Moderate-sized hiatal hernia. 4. Aortoiliac atherosclerosis without aneurysm. 5. Levoscoliosis with discogenic endplate sclerosis secondary to moderate-to-marked degenerative disc disease at L4-5. Electronically Signed   By: Ashley Royalty M.D.   On: 03/26/2017 21:41    ____________________________________________   PROCEDURES  Procedure(s) performed: None  Procedures  Critical Care performed: No ____________________________________________   INITIAL IMPRESSION / ASSESSMENT AND PLAN / ED COURSE  Pertinent labs & imaging results that were available during my care of the patient were reviewed by me and considered in my medical decision making (see chart for details).  76 y.o. female with recent lumpectomy presenting for 5-6 days of constipation, now with right lower quadrant pain and nausea.  Overall, I am most concerned about obstruction or ileus given the patient's recent surgical history.  Appendicitis is also possible.  Ovarian pathology is less likely.  Ischemic disease of the bowel is also much less likely.  We will plan to get a CT scan, and initiate symptomatic treatment.  Plan reevaluation for final disposition.  I reviewed the patient's medical chart.  ----------------------------------------- 9:55 PM on 03/26/2017 -----------------------------------------  The patient has an incarcerated inguinal hernia with omental fat and small bowel in the right lower quadrant on CT.  At this time I spoke with Dr. Dahlia Byes, who is the general surgeon  on-call, who will admit the patient.  He does not recommend any antibiotics at this time.  Plan admission.  I reevaluated the patient, who states that her pain has completely resolved.    ____________________________________________  FINAL CLINICAL IMPRESSION(S) / ED DIAGNOSES  Final diagnoses:  Incarcerated inguinal hernia         NEW MEDICATIONS STARTED DURING THIS VISIT:  This SmartLink is deprecated. Use AVSMEDLIST instead to display the medication list for a patient.    Eula Listen, MD 03/27/17 [phone]

## 2017-03-26 NOTE — Op Note (Signed)
  03/26/2017  11:43 PM  PATIENT:  Krista Cisneros  76 y.o. female  PRE-OPERATIVE DIAGNOSIS: Incarcerated right inguinal hernia   POST-OPERATIVE DIAGNOSIS:  Same  PROCEDURE:   1. Open primary repair of right inguinal hernia   SURGEON:  Surgeon(s) and Role:    * Pabon, Diego F, MD - Primary   ANESTHESIA: GETA  FINDINGS Incarcerated hernia Viable bowel , no evidence of ischemia  DICTATION:  Patient was explained about the procedure in  detail, risk benefits possible complications and a consent was obtained. The patient taken to the operating room and placed in the supine position.  Right inguinal incision was created and we dissected this up to his tissue using electrocautery. External oblique fascia was incised and the inguinal canal was opened. There was the hernia that was an indirect defect. We opened the sac and were able to identify the omentum and small bowel that was viable. At the hernia contents were pushed back into the abdominal cavity and the hernia sac was ligated with another Ethibond suture. Because of the fragility of this patient, advanced age and immunocompromised state I decided not to place mesh because of concern for potential infection. The defect was about 2 cm and I decided to do a tissue repair approximating the origin tendon to the shelving edge of the inguinal ligament. There was no tension over defect. Tablet fascia was approximated with a 2-0 Vicryl and subcutaneous tissues tissue with a 3-0 Vicryl. Skin was closed in a subcutaneous fashion with 4-0 Monocryl and Dermabond applied  Marcaine quarter percent with epinephrine was injected around the wound site. Needle and laparotomy counts were correct and there were no immediate complications  Jules Husbands, MD

## 2017-03-26 NOTE — H&P (Signed)
Patient ID: Krista Cisneros, female   DOB: 05/15/1940, 76 y.o.   MRN: [id number]  HPI Krista Cisneros is a 76 y.o. female status post right lumpectomy 5 days ago by Dr. Adonis Huguenin. Comes to the emergency room with severe right lower quadrant and inguinal pain that is started this afternoon. Pain has been there for a few hours, it is Severe, constant and sharp in nature. Worsening with movement.associatd nausea, no vomiting. No fevers/ no complications from her lumpectomy. A CT scan was performed and a half personally reviewed showing evidence of a right inguinal hernia with small bowel incarceration. No free air and no evidence of ischemia. White count is elevated to 13,000 hemoglobin is 12, creatinine is 1.1.  HPI  Past Medical History:  Diagnosis Date  . Anxiety   . Arthritis   . Cancer [facility])    Right Breast Cancer  . COPD (chronic obstructive pulmonary disease) ([place])   . Depression   . Dyspnea    with exertion  . GERD (gastroesophageal reflux disease)   . Hyperlipidemia   . Hypertension     Past Surgical History:  Procedure Laterality Date  . ABDOMINAL HYSTERECTOMY  1990   Partial  . BREAST BIOPSY Right 10/04/2016   axilla lymph node and axillay tail mass biopsy. invasive mammary carcinoma  . BREAST LUMPECTOMY Right 03/21/2017  . BREAST LUMPECTOMY WITH NEEDLE LOCALIZATION Right 03/21/2017   Procedure: BREAST LUMPECTOMY WITH NEEDLE LOCALIZATION;  Surgeon: Clayburn Pert, MD;  Location: [facility] ORS;  Service: General;  Laterality: Right;  . DILATION AND CURETTAGE OF UTERUS    . PORTACATH PLACEMENT Left 10/24/2016   Procedure: INSERTION PORT-A-CATH;  Surgeon: [name], MD;  Location: [facility] ORS;  Service: General;  Laterality: Left;  . SENTINEL NODE BIOPSY Right 03/21/2017   Procedure: SENTINEL NODE BIOPSY;  Surgeon: Clayburn Pert, MD;  Location: [facility] ORS;  Service: General;  Laterality: Right;    Family History  Problem Relation Age of Onset  . Colon cancer Mother    . Breast cancer Neg Hx     Social History Social History   Tobacco Use  . Smoking status: Former Smoker    Packs/day: 0.50    Types: Cigarettes    Last attempt to quit: 07/23/1988    Years since quitting: 28.6  . Smokeless tobacco: Never Used  Substance Use Topics  . Alcohol use: No  . Drug use: No    Allergies  Allergen Reactions  . Penicillins Swelling and Other (See Comments)    Has patient had a PCN reaction causing immediate rash, facial/tongue/throat swelling, SOB or lightheadedness with hypotension: Yes Has patient had a PCN reaction causing severe rash involving mucus membranes or skin necrosis: No Has patient had a PCN reaction that required hospitalization: No Has patient had a PCN reaction occurring within the last 10 years: No If all of the above answers are "NO", then may proceed with Cephalosporin use.   . Atorvastatin Other (See Comments)    Muscle Pain  . Gabapentin Swelling  . Ezetimibe Other (See Comments)    Current Facility-Administered Medications  Medication Dose Route Frequency Provider Last Rate Last Dose  . clindamycin (CLEOCIN) IVPB 900 mg  900 mg Intravenous Once Eula Listen, MD      . potassium chloride 10 mEq in 100 mL IVPB  10 mEq Intravenous Q1 Hr x 2 Eula Listen, MD 100 mL/hr at 03/26/17 2135 10 mEq at 03/26/17 2135   Current Outpatient Medications  Medication Sig Dispense Refill  .  ADVAIR DISKUS 500-50 MCG/DOSE AEPB Inhale 1 puff into the lungs 2 (two) times daily.     Marland Kitchen ALPRAZolam (XANAX) 0.25 MG tablet Take 1 tablet (0.25 mg total) by mouth at bedtime as needed for anxiety. 30 tablet 0  . Aspirin-Caffeine (BAYER BACK & BODY PAIN EX ST PO) Take 2 tablets daily as needed by mouth (for back pain).    . baclofen (LIORESAL) 10 MG tablet Take 10 mg by mouth at bedtime.  9  . co-enzyme Q-10 30 MG capsule Take 30 mg by mouth daily.    . diphenoxylate-atropine (LOMOTIL) 2.5-0.025 MG tablet Take 1 tablet by mouth 4 (four) times  daily as needed for diarrhea or loose stools. 30 tablet 1  . DULoxetine (CYMBALTA) 60 MG capsule Take 1 capsule by mouth 1 day or 1 dose.  11  . losartan-hydrochlorothiazide (HYZAAR) 100-25 MG tablet Take 1 tablet by mouth daily.     . montelukast (SINGULAIR) 10 MG tablet Take 10 mg by mouth daily.     . Omega-3 Fatty Acids (FISH OIL PO) Take 1 capsule by mouth daily.     Marland Kitchen omeprazole (PRILOSEC) 20 MG capsule Take 20 mg by mouth daily.    Marland Kitchen oxyCODONE-acetaminophen (ROXICET) 5-325 MG tablet Take 1 tablet by mouth every 6 (six) hours as needed. (Patient taking differently: Take 1 tablet every 6 (six) hours as needed by mouth for moderate pain. ) 30 tablet 0  . oxyCODONE-acetaminophen (ROXICET) 5-325 MG tablet Take 1 tablet by mouth every 4 (four) hours as needed for severe pain. 30 tablet 0  . potassium chloride SA (K-DUR,KLOR-CON) 20 MEQ tablet Take 2 tablets (40 mEq total) by mouth daily. This is in addition to ur regular dose of 4meq daily (Patient taking differently: Take 40 mEq daily by mouth. ) 6 tablet 0  . promethazine (PHENERGAN) 25 MG tablet TAKE 25 MG BY MOUTH EVERY 6-8 HOURS AS NEEDED FOR NAUSEA  2  . triamcinolone ointment (KENALOG) 0.5 % Apply 1 application topically 2 (two) times daily. Rash on bilateral upper extremities 30 g 0  . lidocaine-prilocaine (EMLA) cream Apply 1 application as needed topically (for port access).     Facility-Administered Medications Ordered in Other Encounters  Medication Dose Route Frequency Provider Last Rate Last Dose  . 0.9 %  sodium chloride infusion   Intravenous Once Lloyd Huger, MD      . 0.9 %  sodium chloride infusion   Intravenous Once Lloyd Huger, MD      . 0.9 %  sodium chloride infusion   Intravenous Once Lloyd Huger, MD      . 0.9 %  sodium chloride infusion   Intravenous Continuous Grayland Ormond, Kathlene November, MD      . ondansetron (ZOFRAN) 8 mg in sodium chloride 0.9 % 50 mL IVPB   Intravenous Once Lloyd Huger, MD          Review of Systems Full ROS  was asked and was negative except for the information on the HPI  Physical Exam Blood pressure (!) 177/74, pulse 81, temperature (!) 97.4 F (36.3 C), temperature source Oral, resp. rate 20, height 5\' 2"  (1.575 m), weight 70.3 kg (155 lb), SpO2 93 %. CONSTITUTIONAL: Debilitated female wearing O2 [place] EYES: Pupils are equal, round, and reactive to light, Sclera are non-icteric. EARS, NOSE, MOUTH AND THROAT: The oropharynx is clear. The oral mucosa is pink and moist. Hearing is intact to voice. LYMPH NODES:  Lymph nodes in the neck  are normal. RESPIRATORY:  Lungs are clear. There is normal respiratory effort, with equal breath sounds bilaterally, and without pathologic use of accessory muscles. CARDIOVASCULAR: Heart is regular without murmurs, gallops, or rubs. GI: The abdomen is  soft, squeezing tenderness to palpation in right inguinal area with an incarcerated hernia. There is no peritonitis. No masses. GU: Rectal deferred.   MUSCULOSKELETAL: Normal muscle strength and tone. No cyanosis or edema.   SKIN: Turgor is good and there are no pathologic skin lesions or ulcers. NEUROLOGIC: Motor and sensation is grossly normal. Cranial nerves are grossly intact. PSYCH:  Oriented to person, place and time. Affect is normal.  Data Reviewed  I have personally reviewed the patient's imaging, laboratory findings and medical records.    Assessment/  Plan 76 year old female with a history of COPD and a recent lumpectomy now presented with an incarcerated right inguinal hernia involving small bowel. Discussed with the patient detail about her situation and my recommendation to proceed emergently to the operating room for repair possibly bowel resection. Discussed with the patient and the family in detail about the procedure. Risk, benefits and possible complications including but not limited to: Bleeding, infection, recurrence, anesthetic complications and organ injury.  She understands and wishes to proceed. We'll go ahead and give her a bolus of normal saline and order clindamycin on call to the OR given her penicillin allergy Consent was obtained  Caroleen Hamman, MD FACS General Surgeon 03/26/2017, 10:14 PM

## 2017-03-26 NOTE — ED Triage Notes (Signed)
Lower abdominal pain that started 1 hour PTA. Nausea. Denies diarrhea and vomiting. Took percocet PTA. Pt alert and oriented X4, active, cooperative, pt in NAD. RR even and unlabored, color WNL.

## 2017-03-26 NOTE — Anesthesia Post-op Follow-up Note (Signed)
Anesthesia QCDR form completed.        

## 2017-03-27 ENCOUNTER — Encounter: Payer: Self-pay | Admitting: Surgery

## 2017-03-27 ENCOUNTER — Other Ambulatory Visit: Payer: Self-pay

## 2017-03-27 ENCOUNTER — Encounter: Payer: Medicare HMO | Admitting: General Surgery

## 2017-03-27 LAB — COMPREHENSIVE METABOLIC PANEL
ALT: 15 U/L (ref 14–54)
AST: 25 U/L (ref 15–41)
Albumin: 2.7 g/dL — ABNORMAL LOW (ref 3.5–5.0)
Alkaline Phosphatase: 68 U/L (ref 38–126)
Anion gap: 9 (ref 5–15)
BILIRUBIN TOTAL: 0.5 mg/dL (ref 0.3–1.2)
BUN: 15 mg/dL (ref 6–20)
CHLORIDE: 102 mmol/L (ref 101–111)
CO2: 23 mmol/L (ref 22–32)
Calcium: 8.7 mg/dL — ABNORMAL LOW (ref 8.9–10.3)
Creatinine, Ser: 1.2 mg/dL — ABNORMAL HIGH (ref 0.44–1.00)
GFR, EST AFRICAN AMERICAN: 50 mL/min — AB (ref >60)
GFR, EST NON AFRICAN AMERICAN: 43 mL/min — AB (ref >60)
Glucose, Bld: 145 mg/dL — ABNORMAL HIGH (ref 65–99)
POTASSIUM: 4.1 mmol/L (ref 3.5–5.1)
Sodium: 134 mmol/L — ABNORMAL LOW (ref 135–145)
TOTAL PROTEIN: 6.6 g/dL (ref 6.5–8.1)

## 2017-03-27 LAB — PHOSPHORUS: PHOSPHORUS: 4.1 mg/dL (ref 2.5–4.6)

## 2017-03-27 LAB — CBC
HEMATOCRIT: 35.8 % (ref 35.0–47.0)
Hemoglobin: 11.4 g/dL — ABNORMAL LOW (ref 12.0–16.0)
MCH: 31 pg (ref 26.0–34.0)
MCHC: 31.8 g/dL — ABNORMAL LOW (ref 32.0–36.0)
MCV: 97.5 fL (ref 80.0–100.0)
PLATELETS: 213 10*3/uL (ref 150–440)
RBC: 3.67 MIL/uL — AB (ref 3.80–5.20)
RDW: 17.3 % — AB (ref 11.5–14.5)
WBC: 12.4 10*3/uL — AB (ref 3.6–11.0)

## 2017-03-27 LAB — MAGNESIUM: Magnesium: 1.3 mg/dL — ABNORMAL LOW (ref 1.7–2.4)

## 2017-03-27 MED ORDER — OXYCODONE HCL 5 MG PO TABS: 5.0000 mg | ORAL_TABLET | ORAL | Status: DC | PRN

## 2017-03-27 MED ORDER — FLUTICASONE FUROATE-VILANTEROL 200-25 MCG/INH IN AEPB
1.0000 | INHALATION_SPRAY | Freq: Every day | RESPIRATORY_TRACT | Status: DC
  Administered 2017-03-27 – 2017-03-28 (×2): 1 via RESPIRATORY_TRACT
  Filled 2017-03-27: qty 28

## 2017-03-27 MED ORDER — ONDANSETRON 4 MG PO TBDP: 4.0000 mg | ORAL_TABLET | Freq: Four times a day (QID) | ORAL | Status: DC | PRN

## 2017-03-27 MED ORDER — MONTELUKAST SODIUM 10 MG PO TABS
10.0000 mg | ORAL_TABLET | Freq: Every day | ORAL | Status: DC
Start: 2017-03-27 — End: 2017-03-28
  Administered 2017-03-27 – 2017-03-28 (×2): 10 mg via ORAL
  Filled 2017-03-27 (×2): qty 1

## 2017-03-27 MED ORDER — HYDROCHLOROTHIAZIDE 25 MG PO TABS
25.0000 mg | ORAL_TABLET | Freq: Every day | ORAL | Status: DC
  Administered 2017-03-27 – 2017-03-28 (×2): 25 mg via ORAL
  Filled 2017-03-27 (×2): qty 1

## 2017-03-27 MED ORDER — ENOXAPARIN SODIUM 40 MG/0.4ML ~~LOC~~ SOLN
40.0000 mg | SUBCUTANEOUS | Status: DC
  Administered 2017-03-27 – 2017-03-28 (×2): 40 mg via SUBCUTANEOUS
  Filled 2017-03-27 (×2): qty 0.4

## 2017-03-27 MED ORDER — ALPRAZOLAM 0.25 MG PO TABS: 0.2500 mg | ORAL_TABLET | Freq: Every evening | ORAL | Status: DC | PRN

## 2017-03-27 MED ORDER — DIPHENHYDRAMINE HCL 12.5 MG/5ML PO ELIX
12.5000 mg | ORAL_SOLUTION | Freq: Four times a day (QID) | ORAL | Status: DC | PRN
  Filled 2017-03-27: qty 5

## 2017-03-27 MED ORDER — BACLOFEN 10 MG PO TABS
10.0000 mg | ORAL_TABLET | Freq: Every day | ORAL | Status: DC
  Administered 2017-03-27 (×2): 10 mg via ORAL
  Filled 2017-03-27 (×4): qty 1

## 2017-03-27 MED ORDER — LOSARTAN POTASSIUM 50 MG PO TABS
100.0000 mg | ORAL_TABLET | Freq: Every day | ORAL | Status: DC
  Administered 2017-03-27 – 2017-03-28 (×2): 100 mg via ORAL
  Filled 2017-03-27 (×2): qty 2

## 2017-03-27 MED ORDER — HYDRALAZINE HCL 20 MG/ML IJ SOLN: 10.0000 mg | INTRAMUSCULAR | Status: DC | PRN

## 2017-03-27 MED ORDER — MORPHINE SULFATE (PF) 2 MG/ML IV SOLN: 2.0000 mg | INTRAVENOUS | Status: DC | PRN

## 2017-03-27 MED ORDER — DULOXETINE HCL 30 MG PO CPEP
60.0000 mg | ORAL_CAPSULE | Freq: Every day | ORAL | Status: DC
  Administered 2017-03-27 – 2017-03-28 (×2): 60 mg via ORAL
  Filled 2017-03-27 (×2): qty 2

## 2017-03-27 MED ORDER — DIPHENHYDRAMINE HCL 50 MG/ML IJ SOLN: 12.5000 mg | Freq: Four times a day (QID) | INTRAMUSCULAR | Status: DC | PRN

## 2017-03-27 MED ORDER — MAGNESIUM SULFATE 4 GM/100ML IV SOLN
4.0000 g | Freq: Once | INTRAVENOUS | Status: AC
  Administered 2017-03-27: 4 g via INTRAVENOUS
  Filled 2017-03-27: qty 100

## 2017-03-27 MED ORDER — LOSARTAN POTASSIUM-HCTZ 100-25 MG PO TABS: 1.0000 | ORAL_TABLET | Freq: Every day | ORAL | Status: DC

## 2017-03-27 MED ORDER — PANTOPRAZOLE SODIUM 40 MG IV SOLR
40.0000 mg | Freq: Two times a day (BID) | INTRAVENOUS | Status: DC
  Administered 2017-03-27 – 2017-03-28 (×4): 40 mg via INTRAVENOUS
  Filled 2017-03-27 (×4): qty 40

## 2017-03-27 MED ORDER — LACTATED RINGERS IV SOLN
INTRAVENOUS | Status: DC
  Administered 2017-03-27 (×2): via INTRAVENOUS

## 2017-03-27 MED ORDER — ONDANSETRON HCL 4 MG/2ML IJ SOLN: 4.0000 mg | Freq: Four times a day (QID) | INTRAMUSCULAR | Status: DC | PRN

## 2017-03-27 MED ORDER — ACETAMINOPHEN 500 MG PO TABS
1000.0000 mg | ORAL_TABLET | Freq: Four times a day (QID) | ORAL | Status: DC
  Administered 2017-03-27 – 2017-03-28 (×6): 1000 mg via ORAL
  Filled 2017-03-27 (×6): qty 2

## 2017-03-27 MED ORDER — PROMETHAZINE HCL 25 MG PO TABS: 25.0000 mg | ORAL_TABLET | ORAL | Status: DC | PRN

## 2017-03-27 NOTE — Progress Notes (Signed)
Per Dr. Adonis Huguenin okay for RN to place order for regular diet and DC pt's fluids.

## 2017-03-27 NOTE — Progress Notes (Signed)
1 Day Post-Op   Subjective: Patient well-known to me for her breast cancer surgery that was performed last week.  Underwent emergent right inguinal hernia repair overnight.  She states she continues to have some soreness to the area but feels much better than before the surgery.  She states she is still a little bit lightheaded but otherwise doing well.  Vital signs in last 24 hours: Temp:  [97.4 F (36.3 C)-98.2 F (36.8 C)] 97.9 F (36.6 C) (12/04 0505) Pulse Rate:  [40-99] 78 (12/04 0505) Resp:  [11-20] 16 (12/04 0505) BP: (131-182)/(46-91) 146/61 (12/04 0505) SpO2:  [76 %-98 %] 96 % (12/04 0505) Weight:  [70.3 kg (155 lb)-74.3 kg (163 lb 14.4 oz)] 74.3 kg (163 lb 14.4 oz) (12/04 0103) Last BM Date: 03/21/17  Intake/Output from previous day: 12/03 0701 - 12/04 0700 In: 630 [P.O.:30; I.V.:500; IV Piggyback:100] Out: 430 [Urine:420; Blood:10]  Chest: Right axillary and chest incision well approximated without any evidence of erythema or drainage. GI: Abdomen is soft, nondistended, appropriately tender to palpation at the right groin.  No evidence of immediate recurrence.  Lab Results:  CBC Recent Labs    03/26/17 1859 03/27/17 0401  WBC 13.0* 12.4*  HGB 12.2 11.4*  HCT 37.2 35.8  PLT 270 213   CMP     Component Value Date/Time   NA 134 (L) 03/27/2017 0401   NA 141 04/08/2014 1422   K 4.1 03/27/2017 0401   K 3.9 04/08/2014 1422   CL 102 03/27/2017 0401   CL 107 04/08/2014 1422   CO2 23 03/27/2017 0401   CO2 28 04/08/2014 1422   GLUCOSE 145 (H) 03/27/2017 0401   GLUCOSE 95 04/08/2014 1422   BUN 15 03/27/2017 0401   BUN 11 04/08/2014 1422   CREATININE 1.20 (H) 03/27/2017 0401   CREATININE 1.09 04/08/2014 1422   CALCIUM 8.7 (L) 03/27/2017 0401   CALCIUM 8.8 04/08/2014 1422   PROT 6.6 03/27/2017 0401   ALBUMIN 2.7 (L) 03/27/2017 0401   AST 25 03/27/2017 0401   ALT 15 03/27/2017 0401   ALKPHOS 68 03/27/2017 0401   BILITOT 0.5 03/27/2017 0401   GFRNONAA 43 (L)  03/27/2017 0401   GFRNONAA 52 (L) 04/08/2014 1422   GFRAA 50 (L) 03/27/2017 0401   GFRAA >60 04/08/2014 1422   PT/INR No results for input(s): LABPROT, INR in the last 72 hours.  Studies/Results: Ct Abdomen Pelvis W Contrast  Result Date: 03/26/2017 CLINICAL DATA:  Lower abdominal pain starting 1 hour prior to admission. Nausea. Denies vomiting or diarrhea. History of breast cancer. EXAM: CT ABDOMEN AND PELVIS WITH CONTRAST TECHNIQUE: Multidetector CT imaging of the abdomen and pelvis was performed using the standard protocol following bolus administration of intravenous contrast. CONTRAST:  145mL ISOVUE-300 IOPAMIDOL (ISOVUE-300) INJECTION 61% COMPARISON:  04/09/2014 FINDINGS: Lower chest: Normal size heart without pericardial effusion. Moderate-sized hiatal hernia. Atelectasis at each lung base. Hepatobiliary: No focal liver abnormality is seen. No gallstones, gallbladder wall thickening, or biliary dilatation. Pancreas: Unremarkable. No pancreatic ductal dilatation or surrounding inflammatory changes. Spleen: Normal in size without focal abnormality. Adrenals/Urinary Tract: Normal bilateral adrenal glands. Right upper pole simple appearing renal cyst measuring 2 cm in diameter. No nephrolithiasis or obstructive uropathy. The urinary bladder is unremarkable. Stomach/Bowel: Omental fat, fluid and small bowel containing right inguinal hernia with fluid distention seen within the herniated loop of small intestine up to 2.3 cm in caliber. Correlate to determine whether this can be reduced as incarceration is not excluded given the fluid  distention. Normal small bowel rotation without small bowel obstruction. Moderate fecal residue throughout the colon. Vascular/Lymphatic: Moderate aortoiliac atherosclerosis. No aneurysm. Reproductive: Status post hysterectomy. No adnexal masses. Other: No free air.  No abdominopelvic ascites. 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 Mild levoconvex curvature of the lumbar spine. IMPRESSION: 1. Right-sided inguinal hernia containing omental fat, fluid and a short segment of small intestine. Fluid-filled distention of the herniated small-bowel is noted. This raises concern for possible small bowel incarceration. 2. 2 cm right upper pole simple renal cyst, stable since prior. 3. Moderate-sized hiatal hernia. 4. Aortoiliac atherosclerosis without aneurysm. 5. Levoscoliosis with discogenic endplate sclerosis secondary to moderate-to-marked degenerative disc disease at L4-5. Electronically Signed   By: Ashley Royalty M.D.   On: 03/26/2017 21:41    Assessment/Plan: 76 year old female status post emergent right inguinal hernia repair.  Doing well.  Discussed keeping her on clear liquid diet this morning.  Continue to monitor closely.  Likely discharge home tomorrow if she does well clinically today.  Encourage ambulation and incentive spirometer usage.   Clayburn Pert, MD [facility]  Day [facility] [phone] Night [facility] [phone]  03/27/2017

## 2017-03-27 NOTE — Care Management Obs Status (Signed)
[place] [facility] NOTIFICATION   Patient Details  Name: Krista Cisneros MRN: [id number] Date of Birth: 04/20/1941   Medicare Observation Status Notification Given:  Yes    Beverly Sessions, RN 03/27/2017, 4:10 PM

## 2017-03-27 NOTE — Anesthesia Postprocedure Evaluation (Signed)
Anesthesia Post Note  Patient: Krista Cisneros  Procedure(s) Performed: HERNIA REPAIR INGUINAL INCARCERATED (Right )  Patient location during evaluation: PACU Anesthesia Type: General Level of consciousness: awake and alert and oriented Pain management: pain level controlled Vital Signs Assessment: post-procedure vital signs reviewed and stable Respiratory status: spontaneous breathing, nonlabored ventilation and respiratory function stable Cardiovascular status: blood pressure returned to baseline and stable Postop Assessment: no signs of nausea or vomiting Anesthetic complications: no     Last Vitals:  Vitals:   03/27/17 0224 03/27/17 0505  BP: (!) 151/62 (!) 146/61  Pulse: 79 78  Resp: 17 16  Temp: 36.4 C 36.6 C  SpO2: 98% 96%    Last Pain:  Vitals:   03/27/17 0505  TempSrc: Oral  PainSc:                  Krista Cisneros

## 2017-03-28 ENCOUNTER — Encounter: Payer: Medicare HMO | Admitting: General Surgery

## 2017-03-28 MED ORDER — OXYCODONE HCL 5 MG PO TABS: 5.0000 mg | ORAL_TABLET | ORAL | 0 refills | Status: DC | PRN

## 2017-03-28 NOTE — Discharge Instructions (Signed)
Open Hernia Repair, Adult, Care After These instructions give you information about caring for yourself after your procedure. Your doctor may also give you more specific instructions. If you have problems or questions, contact your doctor. Follow these instructions at home: Surgical cut (incision) care   Follow instructions from your doctor about how to take care of your surgical cut area. Make sure you: ? Wash your hands with soap and water before you change your bandage (dressing). If you cannot use soap and water, use hand sanitizer. ? Change your bandage as told by your doctor. ? Leave stitches (sutures), skin glue, or skin tape (adhesive) strips in place. They may need to stay in place for 2 weeks or longer. If tape strips get loose and curl up, you may trim the loose edges. Do not remove tape strips completely unless your doctor says it is okay.  Check your surgical cut every day for signs of infection. Check for: ? More redness, swelling, or pain. ? More fluid or blood. ? Warmth. ? Pus or a bad smell. Activity  Do not drive or use heavy machinery while taking prescription pain medicine. Do not drive until your doctor says it is okay.  Until your doctor says it is okay: ? Do not lift anything that is heavier than 10 lb (4.5 kg). ? Do not play contact sports.  Return to your normal activities as told by your doctor. Ask your doctor what activities are safe. General instructions  To prevent or treat having a hard time pooping (constipation) while you are taking prescription pain medicine, your doctor may recommend that you: ? Drink enough fluid to keep your pee (urine) clear or pale yellow. ? Take over-the-counter or prescription medicines. ? Eat foods that are high in fiber, such as fresh fruits and vegetables, whole grains, and beans. ? Limit foods that are high in fat and processed sugars, such as fried and sweet foods.  Take over-the-counter and prescription medicines only as  told by your doctor.  Do not take baths, swim, or use a hot tub until your doctor says it is okay. OK to shower later on day of discharge.  Keep all follow-up visits as told by your doctor. This is important. Contact a doctor if:  You develop a rash.  You have more redness, swelling, or pain around your surgical cut.  You have more fluid or blood coming from your surgical cut.  Your surgical cut feels warm to the touch.  You have pus or a bad smell coming from your surgical cut.  You have a fever or chills.  You have blood in your poop (stool).  You have not pooped in 2-3 days.  Medicine does not help your pain. Get help right away if:  You have chest pain or you are short of breath.  You feel light-headed.  You feel weak and dizzy (feel faint).  You have very bad pain.  You throw up (vomit) and your pain is worse. This information is not intended to replace advice given to you by your health care provider. Make sure you discuss any questions you have with your health care provider. Document Released: 05/01/2014 Document Revised: 10/29/2015 Document Reviewed: 09/22/2015 Elsevier Interactive Patient Education  2017 [employer].

## 2017-03-28 NOTE — Progress Notes (Signed)
.   Krista Cisneros  A and O x 4. VSS. Pt tolerating diet well. No complaints of pain or nausea. IV removed intact, prescriptions given. Pt voiced understanding of discharge instructions with no further questions. Pt discharged via wheelchair with axillary.    Allergies as of 03/28/2017      Reactions   Penicillins Swelling, Other (See Comments)   Has patient had a PCN reaction causing immediate rash, facial/tongue/throat swelling, SOB or lightheadedness with hypotension: Yes Has patient had a PCN reaction causing severe rash involving mucus membranes or skin necrosis: No Has patient had a PCN reaction that required hospitalization: No Has patient had a PCN reaction occurring within the last 10 years: No If all of the above answers are "NO", then may proceed with Cephalosporin use.   Atorvastatin Other (See Comments)   Muscle Pain   Gabapentin Swelling   Ezetimibe Other (See Comments)      Medication List    TAKE these medications   ADVAIR DISKUS 500-50 MCG/DOSE Aepb Generic drug:  Fluticasone-Salmeterol Inhale 1 puff into the lungs 2 (two) times daily.   ALPRAZolam 0.25 MG tablet Commonly known as:  XANAX Take 1 tablet (0.25 mg total) by mouth at bedtime as needed for anxiety.   baclofen 10 MG tablet Commonly known as:  LIORESAL Take 10 mg by mouth at bedtime.   BAYER BACK & BODY PAIN EX ST PO Take 2 tablets daily as needed by mouth (for back pain).   co-enzyme Q-10 30 MG capsule Take 30 mg by mouth daily.   diphenoxylate-atropine 2.5-0.025 MG tablet Commonly known as:  LOMOTIL Take 1 tablet by mouth 4 (four) times daily as needed for diarrhea or loose stools.   DULoxetine 60 MG capsule Commonly known as:  CYMBALTA Take 1 capsule by mouth 1 day or 1 dose.   FISH OIL PO Take 1 capsule by mouth daily.   lidocaine-prilocaine cream Commonly known as:  EMLA Apply 1 application as needed topically (for port access).   losartan-hydrochlorothiazide 100-25 MG  tablet Commonly known as:  HYZAAR Take 1 tablet by mouth daily.   montelukast 10 MG tablet Commonly known as:  SINGULAIR Take 10 mg by mouth daily.   omeprazole 20 MG capsule Commonly known as:  PRILOSEC Take 20 mg by mouth daily.   oxyCODONE 5 MG immediate release tablet Commonly known as:  Oxy IR/ROXICODONE Take 1-2 tablets (5-10 mg total) by mouth every 4 (four) hours as needed for moderate pain or severe pain.   oxyCODONE-acetaminophen 5-325 MG tablet Commonly known as:  ROXICET Take 1 tablet by mouth every 4 (four) hours as needed for severe pain.   potassium chloride SA 20 MEQ tablet Commonly known as:  K-DUR,KLOR-CON Take 2 tablets (40 mEq total) by mouth daily. This is in addition to ur regular dose of 49meq daily What changed:  additional instructions   promethazine 25 MG tablet Commonly known as:  PHENERGAN TAKE 25 MG BY MOUTH EVERY 6-8 HOURS AS NEEDED FOR NAUSEA   triamcinolone ointment 0.5 % Commonly known as:  KENALOG Apply 1 application topically 2 (two) times daily. Rash on bilateral upper extremities       Vitals:   03/28/17 0446 03/28/17 0906  BP: (!) 159/75 (!) 138/46  Pulse: 70 79  Resp: 18   Temp: 97.6 F (36.4 C)   SpO2: 95%     Francesco Sor

## 2017-03-28 NOTE — Discharge Summary (Signed)
Patient ID: Krista Cisneros MRN: [id number] DOB/AGE: 1940-05-07 76 y.o.  Admit date: 03/26/2017 Discharge date: 03/28/2017  Discharge Diagnoses:  Incarcerated right inguinal hernia  Procedures Performed: Open right inguinal hernia repair  Discharged Condition: good  Hospital Course: Patient taken to the OR from the ER with an incarcerated inguinal hernia. Tolerated an open repair without difficulty and able to be discharged home the second post operative day. On day of discharge her incisions were well approximated and her abdomen was nontender. She was ambulating without difficulty and tolerating a regular diet.  Discharge Orders: Discharge Instructions    Call MD for:  difficulty breathing, headache or visual disturbances   Complete by:  As directed    Call MD for:  persistant nausea and vomiting   Complete by:  As directed    Call MD for:  redness, tenderness, or signs of infection (pain, swelling, redness, odor or green/yellow discharge around incision site)   Complete by:  As directed    Call MD for:  severe uncontrolled pain   Complete by:  As directed    Call MD for:  temperature >100.4   Complete by:  As directed    Diet - low sodium heart healthy   Complete by:  As directed    Increase activity slowly   Complete by:  As directed       Disposition: 01-Home or Self Care  Discharge Medications: Allergies as of 03/28/2017      Reactions   Penicillins Swelling, Other (See Comments)   Has patient had a PCN reaction causing immediate rash, facial/tongue/throat swelling, SOB or lightheadedness with hypotension: Yes Has patient had a PCN reaction causing severe rash involving mucus membranes or skin necrosis: No Has patient had a PCN reaction that required hospitalization: No Has patient had a PCN reaction occurring within the last 10 years: No If all of the above answers are "NO", then may proceed with Cephalosporin use.   Atorvastatin Other (See Comments)   Muscle  Pain   Gabapentin Swelling   Ezetimibe Other (See Comments)      Medication List    TAKE these medications   ADVAIR DISKUS 500-50 MCG/DOSE Aepb Generic drug:  Fluticasone-Salmeterol Inhale 1 puff into the lungs 2 (two) times daily.   ALPRAZolam 0.25 MG tablet Commonly known as:  XANAX Take 1 tablet (0.25 mg total) by mouth at bedtime as needed for anxiety.   baclofen 10 MG tablet Commonly known as:  LIORESAL Take 10 mg by mouth at bedtime.   BAYER BACK & BODY PAIN EX ST PO Take 2 tablets daily as needed by mouth (for back pain).   co-enzyme Q-10 30 MG capsule Take 30 mg by mouth daily.   diphenoxylate-atropine 2.5-0.025 MG tablet Commonly known as:  LOMOTIL Take 1 tablet by mouth 4 (four) times daily as needed for diarrhea or loose stools.   DULoxetine 60 MG capsule Commonly known as:  CYMBALTA Take 1 capsule by mouth 1 day or 1 dose.   FISH OIL PO Take 1 capsule by mouth daily.   lidocaine-prilocaine cream Commonly known as:  EMLA Apply 1 application as needed topically (for port access).   losartan-hydrochlorothiazide 100-25 MG tablet Commonly known as:  HYZAAR Take 1 tablet by mouth daily.   montelukast 10 MG tablet Commonly known as:  SINGULAIR Take 10 mg by mouth daily.   omeprazole 20 MG capsule Commonly known as:  PRILOSEC Take 20 mg by mouth daily.   oxyCODONE 5 MG immediate release  tablet Commonly known as:  Oxy IR/ROXICODONE Take 1-2 tablets (5-10 mg total) by mouth every 4 (four) hours as needed for moderate pain or severe pain.   oxyCODONE-acetaminophen 5-325 MG tablet Commonly known as:  ROXICET Take 1 tablet by mouth every 4 (four) hours as needed for severe pain.   potassium chloride SA 20 MEQ tablet Commonly known as:  K-DUR,KLOR-CON Take 2 tablets (40 mEq total) by mouth daily. This is in addition to ur regular dose of 64meq daily What changed:  additional instructions   promethazine 25 MG tablet Commonly known as:  PHENERGAN TAKE  25 MG BY MOUTH EVERY 6-8 HOURS AS NEEDED FOR NAUSEA   triamcinolone ointment 0.5 % Commonly known as:  KENALOG Apply 1 application topically 2 (two) times daily. Rash on bilateral upper extremities        Follwup: Follow-up Information    Clayburn Pert, MD. Go in 1 week(s).   Specialty:  General Surgery Why:  Report to clinic at 1:15 for a 1:30pm appointment next Wednesday 12/12 in the [place] office. Contact information: [address] [place] [address] [phone]           Signed: Clayburn Pert 03/28/2017, 9:14 AM

## 2017-04-03 ENCOUNTER — Inpatient Hospital Stay: Payer: Medicare HMO | Attending: Oncology | Admitting: Oncology

## 2017-04-03 ENCOUNTER — Inpatient Hospital Stay: Payer: Medicare HMO | Admitting: *Deleted

## 2017-04-03 ENCOUNTER — Inpatient Hospital Stay: Payer: Medicare HMO

## 2017-04-03 VITALS — BP 129/79 | HR 66 | Temp 97.8°F | Resp 18 | Wt 158.5 lb

## 2017-04-03 DIAGNOSIS — F329 Major depressive disorder, single episode, unspecified: Secondary | ICD-10-CM | POA: Diagnosis not present

## 2017-04-03 DIAGNOSIS — K449 Diaphragmatic hernia without obstruction or gangrene: Secondary | ICD-10-CM | POA: Diagnosis not present

## 2017-04-03 DIAGNOSIS — F419 Anxiety disorder, unspecified: Secondary | ICD-10-CM | POA: Insufficient documentation

## 2017-04-03 DIAGNOSIS — Z171 Estrogen receptor negative status [ER-]: Secondary | ICD-10-CM | POA: Insufficient documentation

## 2017-04-03 DIAGNOSIS — Z87891 Personal history of nicotine dependence: Secondary | ICD-10-CM | POA: Insufficient documentation

## 2017-04-03 DIAGNOSIS — K219 Gastro-esophageal reflux disease without esophagitis: Secondary | ICD-10-CM | POA: Insufficient documentation

## 2017-04-03 DIAGNOSIS — C50411 Malignant neoplasm of upper-outer quadrant of right female breast: Secondary | ICD-10-CM | POA: Diagnosis not present

## 2017-04-03 DIAGNOSIS — Z5111 Encounter for antineoplastic chemotherapy: Secondary | ICD-10-CM | POA: Insufficient documentation

## 2017-04-03 DIAGNOSIS — E785 Hyperlipidemia, unspecified: Secondary | ICD-10-CM | POA: Insufficient documentation

## 2017-04-03 DIAGNOSIS — M5136 Other intervertebral disc degeneration, lumbar region: Secondary | ICD-10-CM | POA: Insufficient documentation

## 2017-04-03 DIAGNOSIS — N281 Cyst of kidney, acquired: Secondary | ICD-10-CM | POA: Insufficient documentation

## 2017-04-03 DIAGNOSIS — Z79899 Other long term (current) drug therapy: Secondary | ICD-10-CM | POA: Insufficient documentation

## 2017-04-03 DIAGNOSIS — Z95828 Presence of other vascular implants and grafts: Secondary | ICD-10-CM

## 2017-04-03 DIAGNOSIS — Z7982 Long term (current) use of aspirin: Secondary | ICD-10-CM | POA: Diagnosis not present

## 2017-04-03 DIAGNOSIS — I10 Essential (primary) hypertension: Secondary | ICD-10-CM | POA: Insufficient documentation

## 2017-04-03 DIAGNOSIS — J449 Chronic obstructive pulmonary disease, unspecified: Secondary | ICD-10-CM | POA: Insufficient documentation

## 2017-04-03 LAB — CBC WITH DIFFERENTIAL/PLATELET
Basophils Absolute: 0.1 10*3/uL (ref 0–0.1)
Basophils Relative: 1 %
Eosinophils Absolute: 0.7 10*3/uL (ref 0–0.7)
Eosinophils Relative: 6 %
HEMATOCRIT: 33.9 % — AB (ref 35.0–47.0)
Hemoglobin: 11 g/dL — ABNORMAL LOW (ref 12.0–16.0)
LYMPHS ABS: 2.9 10*3/uL (ref 1.0–3.6)
LYMPHS PCT: 26 %
MCH: 31.2 pg (ref 26.0–34.0)
MCHC: 32.5 g/dL (ref 32.0–36.0)
MCV: 96.1 fL (ref 80.0–100.0)
MONO ABS: 1.1 10*3/uL — AB (ref 0.2–0.9)
MONOS PCT: 9 %
NEUTROS ABS: 6.5 10*3/uL (ref 1.4–6.5)
Neutrophils Relative %: 58 %
Platelets: 327 10*3/uL (ref 150–440)
RBC: 3.53 MIL/uL — ABNORMAL LOW (ref 3.80–5.20)
RDW: 16.5 % — AB (ref 11.5–14.5)
WBC: 11.3 10*3/uL — ABNORMAL HIGH (ref 3.6–11.0)

## 2017-04-03 LAB — COMPREHENSIVE METABOLIC PANEL
ALBUMIN: 3 g/dL — AB (ref 3.5–5.0)
ALK PHOS: 63 U/L (ref 38–126)
ALT: 12 U/L — ABNORMAL LOW (ref 14–54)
ANION GAP: 9 (ref 5–15)
AST: 23 U/L (ref 15–41)
BUN: 16 mg/dL (ref 6–20)
CO2: 25 mmol/L (ref 22–32)
Calcium: 8.9 mg/dL (ref 8.9–10.3)
Chloride: 104 mmol/L (ref 101–111)
Creatinine, Ser: 1.13 mg/dL — ABNORMAL HIGH (ref 0.44–1.00)
GFR calc Af Amer: 54 mL/min — ABNORMAL LOW (ref >60)
GFR calc non Af Amer: 46 mL/min — ABNORMAL LOW (ref >60)
GLUCOSE: 127 mg/dL — AB (ref 65–99)
POTASSIUM: 3.6 mmol/L (ref 3.5–5.1)
SODIUM: 138 mmol/L (ref 135–145)
Total Bilirubin: 0.6 mg/dL (ref 0.3–1.2)
Total Protein: 6.9 g/dL (ref 6.5–8.1)

## 2017-04-03 LAB — MAGNESIUM: Magnesium: 1.6 mg/dL — ABNORMAL LOW (ref 1.7–2.4)

## 2017-04-03 MED ORDER — DIPHENHYDRAMINE HCL 25 MG PO CAPS
25.0000 mg | ORAL_CAPSULE | Freq: Once | ORAL | Status: AC
  Administered 2017-04-03: 25 mg via ORAL
  Filled 2017-04-03: qty 1

## 2017-04-03 MED ORDER — SODIUM CHLORIDE 0.9% FLUSH
10.0000 mL | Freq: Once | INTRAVENOUS | Status: AC
  Administered 2017-04-03: 10 mL via INTRAVENOUS
  Filled 2017-04-03: qty 10

## 2017-04-03 MED ORDER — ACETAMINOPHEN 325 MG PO TABS
650.0000 mg | ORAL_TABLET | Freq: Once | ORAL | Status: AC
  Administered 2017-04-03: 650 mg via ORAL
  Filled 2017-04-03: qty 2

## 2017-04-03 MED ORDER — SODIUM CHLORIDE 0.9 % IV SOLN: 2.0000 g | Freq: Once | INTRAVENOUS | Status: DC

## 2017-04-03 MED ORDER — SODIUM CHLORIDE 0.9 % IV SOLN
Freq: Once | INTRAVENOUS | Status: AC
  Administered 2017-04-03: 10:00:00 via INTRAVENOUS
  Filled 2017-04-03: qty 1000

## 2017-04-03 MED ORDER — MAGNESIUM SULFATE 2 GM/50ML IV SOLN
2.0000 g | Freq: Once | INTRAVENOUS | Status: AC
  Administered 2017-04-03: 2 g via INTRAVENOUS
  Filled 2017-04-03: qty 50

## 2017-04-03 MED ORDER — TRASTUZUMAB CHEMO 150 MG IV SOLR
450.0000 mg | Freq: Once | INTRAVENOUS | Status: AC
  Administered 2017-04-03: 450 mg via INTRAVENOUS
  Filled 2017-04-03: qty 21.43

## 2017-04-03 MED ORDER — HEPARIN SOD (PORK) LOCK FLUSH 100 UNIT/ML IV SOLN
500.0000 [IU] | Freq: Once | INTRAVENOUS | Status: AC
  Administered 2017-04-03: 500 [IU] via INTRAVENOUS
  Filled 2017-04-03: qty 5

## 2017-04-03 NOTE — Progress Notes (Signed)
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  INTERVAL HISTORY: Patient returns to clinic today for further evaluation and continuation of Herceptin.  She underwent breast lumpectomy on March 21, 2017.  She also underwent emergency abdominal surgery for an incarcerated area.  She continues to have weakness and fatigue, but otherwise feels well.  He has mild tenderness at her surgical sites. She has no neurologic complaints. She denies any fevers. She has no chest pain, cough, or shortness of breath. She denies any nausea, vomiting, constipation, or diarrhea.  She has no abdominal pain.  She has no urinary complaints. Patient offers no further specific complaints.  REVIEW OF SYSTEMS:   Review of Systems  Constitutional: Positive for malaise/fatigue. Negative for fever and weight loss.  Respiratory: Negative for cough and shortness of breath.   Cardiovascular: Negative.  Negative for chest pain and leg swelling.  Gastrointestinal: Negative.  Negative for abdominal pain, diarrhea and nausea.  Genitourinary: Negative.   Musculoskeletal: Negative.  Negative for joint pain.  Skin: Negative.  Negative for rash.  Neurological: Positive for weakness. Negative for sensory change.  Psychiatric/Behavioral: Negative.  The patient is not nervous/anxious.     As per HPI. Otherwise, a complete review of systems is negative.  PAST MEDICAL HISTORY: Past Medical History:  Diagnosis Date  . Anxiety   . Arthritis   . Cancer [facility])    Right Breast Cancer  . COPD (chronic obstructive pulmonary disease) ([place])   . Depression   .  Dyspnea    with exertion  . GERD (gastroesophageal reflux disease)   . Hyperlipidemia   . Hypertension     PAST SURGICAL HISTORY: Past Surgical History:  Procedure Laterality Date  . ABDOMINAL HYSTERECTOMY  1990   Partial  . BREAST BIOPSY Right 10/04/2016   axilla lymph node and axillay tail mass biopsy. invasive mammary carcinoma  . BREAST LUMPECTOMY Right 03/21/2017  . BREAST LUMPECTOMY WITH NEEDLE LOCALIZATION Right 03/21/2017   Procedure: BREAST LUMPECTOMY WITH NEEDLE LOCALIZATION;  Surgeon: Clayburn Pert, MD;  Location: [facility] ORS;  Service: General;  Laterality: Right;  . DILATION AND CURETTAGE OF UTERUS    . INGUINAL HERNIA REPAIR Right 03/26/2017   Procedure: HERNIA REPAIR INGUINAL INCARCERATED;  Surgeon: Jules Husbands, MD;  Location: [facility] ORS;  Service: General;  Laterality: Right;  . PORTACATH PLACEMENT Left 10/24/2016   Procedure: INSERTION PORT-A-CATH;  Surgeon: [name], MD;  Location: [facility] ORS;  Service: General;  Laterality: Left;  . SENTINEL NODE BIOPSY Right 03/21/2017   Procedure: SENTINEL NODE BIOPSY;  Surgeon: Clayburn Pert, MD;  Location: [facility] ORS;  Service: General;  Laterality: Right;    FAMILY HISTORY: Family History  Problem Relation Age of Onset  . Colon cancer Mother   . Breast cancer Neg Hx     ADVANCED DIRECTIVES (Y/N):  N  HEALTH MAINTENANCE: Social History   Tobacco Use  . Smoking status: Former Smoker    Packs/day: 0.50    Types: Cigarettes    Last attempt to quit: 07/23/1988    Years since quitting: 28.7  . Smokeless tobacco: Never Used  Substance Use Topics  . Alcohol use: No  . Drug use: No  Colonoscopy:  PAP:  Bone density:  Lipid panel:  Allergies  Allergen Reactions  . Penicillins Swelling and Other (See Comments)    Has patient had a PCN reaction causing immediate rash, facial/tongue/throat swelling, SOB or lightheadedness with hypotension: Yes Has patient had a PCN reaction causing severe rash involving mucus  membranes or skin necrosis: No Has patient had a PCN reaction that required hospitalization: No Has patient had a PCN reaction occurring within the last 10 years: No If all of the above answers are "NO", then may proceed with Cephalosporin use.   . Atorvastatin Other (See Comments)    Muscle Pain  . Gabapentin Swelling  . Ezetimibe Other (See Comments)    Current Outpatient Medications  Medication Sig Dispense Refill  . ADVAIR DISKUS 500-50 MCG/DOSE AEPB Inhale 1 puff into the lungs 2 (two) times daily.     Marland Kitchen ALPRAZolam (XANAX) 0.25 MG tablet Take 1 tablet (0.25 mg total) by mouth at bedtime as needed for anxiety. 30 tablet 0  . Aspirin-Caffeine (BAYER BACK & BODY PAIN EX ST PO) Take 2 tablets daily as needed by mouth (for back pain).    . baclofen (LIORESAL) 10 MG tablet Take 10 mg by mouth at bedtime.  9  . co-enzyme Q-10 30 MG capsule Take 30 mg by mouth daily.    . diphenoxylate-atropine (LOMOTIL) 2.5-0.025 MG tablet Take 1 tablet by mouth 4 (four) times daily as needed for diarrhea or loose stools. 30 tablet 1  . DULoxetine (CYMBALTA) 60 MG capsule Take 1 capsule by mouth 1 day or 1 dose.  11  . lidocaine-prilocaine (EMLA) cream Apply 1 application as needed topically (for port access).    Marland Kitchen losartan-hydrochlorothiazide (HYZAAR) 100-25 MG tablet Take 1 tablet by mouth daily.     . montelukast (SINGULAIR) 10 MG tablet Take 10 mg by mouth daily.     . Omega-3 Fatty Acids (FISH OIL PO) Take 1 capsule by mouth daily.     Marland Kitchen omeprazole (PRILOSEC) 20 MG capsule Take 20 mg by mouth daily.    Marland Kitchen oxyCODONE (OXY IR/ROXICODONE) 5 MG immediate release tablet Take 1-2 tablets (5-10 mg total) by mouth every 4 (four) hours as needed for moderate pain or severe pain. 30 tablet 0  . potassium chloride SA (K-DUR,KLOR-CON) 20 MEQ tablet Take 2 tablets (40 mEq total) by mouth daily. This is in addition to ur regular dose of 48mq daily (Patient taking differently: Take 40 mEq daily by mouth. ) 6 tablet 0    . promethazine (PHENERGAN) 25 MG tablet TAKE 25 MG BY MOUTH EVERY 6-8 HOURS AS NEEDED FOR NAUSEA  2  . triamcinolone ointment (KENALOG) 0.5 % Apply 1 application topically 2 (two) times daily. Rash on bilateral upper extremities 30 g 0  . oxyCODONE-acetaminophen (ROXICET) 5-325 MG tablet Take 1 tablet by mouth every 4 (four) hours as needed for severe pain. (Patient not taking: Reported on 04/03/2017) 30 tablet 0   No current facility-administered medications for this visit.    Facility-Administered Medications Ordered in Other Visits  Medication Dose Route Frequency Provider Last Rate Last Dose  . 0.9 %  sodium chloride infusion   Intravenous Once FLloyd Huger MD      . 0.9 %  sodium chloride infusion   Intravenous Once FLloyd Huger MD      . 0.9 %  sodium chloride infusion   Intravenous Once FLloyd Huger MD      . 0.9 %  sodium chloride infusion  Intravenous Continuous Lloyd Huger, MD      . heparin lock flush 100 unit/mL  500 Units Intravenous Once Lloyd Huger, MD      . ondansetron [facility]) 8 mg in sodium chloride 0.9 % 50 mL IVPB   Intravenous Once Lloyd Huger, MD        OBJECTIVE: Vitals:   04/03/17 0937  BP: 129/79  Pulse: 66  Resp: 18  Temp: 97.8 F (36.6 C)     Body mass index is 28.99 kg/m.    ECOG FS:0 - Asymptomatic  General: Well-developed, well-nourished, no acute distress. Eyes: Pink conjunctiva, anicteric sclera. Breasts: Exam deferred today. Lungs: Clear to auscultation bilaterally. Heart: Regular rate and rhythm. No rubs, murmurs, or gallops. Abdomen: Soft, nontender, nondistended. No organomegaly noted, normoactive bowel sounds. Musculoskeletal: Non-pitting edema in BLE. No cyanosis, or clubbing. Neuro: Alert, answering all questions appropriately. Cranial nerves grossly intact. Skin: No rashes or petechiae noted. Psych: Normal affect.   LAB RESULTS:  Lab Results  Component Value Date   NA 134 (L)  03/27/2017   K 4.1 03/27/2017   CL 102 03/27/2017   CO2 23 03/27/2017   GLUCOSE 145 (H) 03/27/2017   BUN 15 03/27/2017   CREATININE 1.20 (H) 03/27/2017   CALCIUM 8.7 (L) 03/27/2017   PROT 6.6 03/27/2017   ALBUMIN 2.7 (L) 03/27/2017   AST 25 03/27/2017   ALT 15 03/27/2017   ALKPHOS 68 03/27/2017   BILITOT 0.5 03/27/2017   GFRNONAA 43 (L) 03/27/2017   GFRAA 50 (L) 03/27/2017    Lab Results  Component Value Date   WBC 12.4 (H) 03/27/2017   NEUTROABS 7.0 (H) 03/13/2017   HGB 11.4 (L) 03/27/2017   HCT 35.8 03/27/2017   MCV 97.5 03/27/2017   PLT 213 03/27/2017     STUDIES: Ct Abdomen Pelvis W Contrast  Result Date: 03/26/2017 CLINICAL DATA:  Lower abdominal pain starting 1 hour prior to admission. Nausea. Denies vomiting or diarrhea. History of breast cancer. EXAM: CT ABDOMEN AND PELVIS WITH CONTRAST TECHNIQUE: Multidetector CT imaging of the abdomen and pelvis was performed using the standard protocol following bolus administration of intravenous contrast. CONTRAST:  133m ISOVUE-300 IOPAMIDOL (ISOVUE-300) INJECTION 61% COMPARISON:  04/09/2014 FINDINGS: Lower chest: Normal size heart without pericardial effusion. Moderate-sized hiatal hernia. Atelectasis at each lung base. Hepatobiliary: No focal liver abnormality is seen. No gallstones, gallbladder wall thickening, or biliary dilatation. Pancreas: Unremarkable. No pancreatic ductal dilatation or surrounding inflammatory changes. Spleen: Normal in size without focal abnormality. Adrenals/Urinary Tract: Normal bilateral adrenal glands. Right upper pole simple appearing renal cyst measuring 2 cm in diameter. No nephrolithiasis or obstructive uropathy. The urinary bladder is unremarkable. Stomach/Bowel: Omental fat, fluid and small bowel containing right inguinal hernia with fluid distention seen within the herniated loop of small intestine up to 2.3 cm in caliber. Correlate to determine whether this can be reduced as incarceration is not  excluded given the fluid distention. Normal small bowel rotation without small bowel obstruction. Moderate fecal residue throughout the colon. Vascular/Lymphatic: Moderate aortoiliac atherosclerosis. No aneurysm. Reproductive: Status post hysterectomy. No adnexal masses. Other: No free air.  No abdominopelvic ascites. Musculoskeletal: Degenerative disc disease L4-5 with discogenic sclerosis of the endplates. Mild levoconvex curvature of the lumbar spine. IMPRESSION: 1. Right-sided inguinal hernia containing omental fat, fluid and a short segment of small intestine. Fluid-filled distention of the herniated small-bowel is noted. This raises concern for possible small bowel incarceration. 2. 2 cm right upper pole simple renal cyst, stable since  prior. 3. Moderate-sized hiatal hernia. 4. Aortoiliac atherosclerosis without aneurysm. 5. Levoscoliosis with discogenic endplate sclerosis secondary to moderate-to-marked degenerative disc disease at L4-5. Electronically Signed   By: Ashley Royalty M.D.   On: 03/26/2017 21:41   Nm Sentinel Node Injection  Result Date: 03/21/2017 CLINICAL DATA:  Right breast cancer. EXAM: NUCLEAR MEDICINE BREAST LYMPHOSCINTIGRAPHY TECHNIQUE: Intradermal injection of radiopharmaceutical was performed at the 12 o'clock, 3 o'clock, 6 o'clock, and 9 o'clock positions around the right nipple. The patient was then sent to the operating room where the sentinel node(s) were identified and removed by the surgeon. RADIOPHARMACEUTICALS:  Total of 1 mCi Millipore-filtered Technetium-42msulfur colloid, injected in four aliquots of 0.25 mCi each. IMPRESSION: Uncomplicated intradermal injection of a total of 1 mCi Technetium-92mulfur colloid for purposes of sentinel node identification. Electronically Signed   By: HeKathreen Devoid On: 03/21/2017 09:57   Mm Rt Plc Breast Loc Dev   1st Lesion  Inc Mammo Guide  Result Date: 03/21/2017 CLINICAL DATA:  Patient presents for mammographically guided needle  localization of an axillary region right breast carcinoma with an adjacent metastatic lymph node. These lesions were marked with a ribbon shaped clip and a HydroMARK clip respectively. EXAM: NEEDLE LOCALIZATION OF THE RIGHT BREAST WITH MAMMO GUIDANCE COMPARISON:  Previous exams. FINDINGS: Patient presents for needle localization prior to surgical excision. I met with the patient and we discussed the procedure of needle localization including benefits and alternatives. We discussed the high likelihood of a successful procedure. We discussed the risks of the procedure, including infection, bleeding, tissue injury, and further surgery. Informed, written consent was given. The usual time-out protocol was performed immediately prior to the procedure. The axilla region mass and the adjacent enlarged lymph nodes seen previously are no longer visualized mammographically consistent with a positive response to neoadjuvant chemotherapy. The ribbon shaped clip and the HydroMARK clip can only be visualized on any MLO view. Since these clips were only mammographically visible in one view, the clips were localized with 3D imaging. Using mammographic guidance, sterile technique, 1% lidocaine and a 5 cm modified Kopans needle, the ribbon shaped biopsy clip was localized using anterior approach. Using mammographic guidance, sterile technique, 1% lidocaine and a 5 cm modified Kopans needle, the coil shaped HydroMARK biopsy clip was localized using anterior approach. The images were marked for Dr. WoAdonis HugueninIMPRESSION: Needle localization the right breast/ axilla. No apparent complications. Electronically Signed   By: DaLajean Manes.D.   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  PLAN:    1. 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  Treatment was discontinued secondary to poor performance status.  Patient underwent her lumpectomy on March 21, 2017 which appears to be a complete pathologic response, but no biopsy clip was seen in her surgical specimen.  Patient has follow-up with surgery tomorrow to further discuss her options.  MUGA scan from January 19, 2017 revealed an EF of 68% which is unchanged from previous.  Given the PR positivity of her tumor she will benefit from an aromatase inhibitor for 5 years.  Proceed with cycle 7 of 18 of maintenance Herceptin today.  Return to clinic in 3 weeks for consideration of cycle 8.  Patient will have a  MUGA scan prior to her next infusion.   2. Acute renal failure: Nearly resolved. Monitor. 3. Nausea: Patient does not complain of this today.  Continue current antiemetics as ordered. 4. Hypokalemia: Resolved. Continue oral potassium supplementation as prescribed. 5. Hypomagnesemia: Continue oral supplementation.  Patient will receive 2 g IV magnesium today. 6.  Incarcerated hernia: Continue follow-up with surgery as scheduled.  Approximately 30 minutes was spent in discussion of which greater than 50% was consultation.  Patient expressed understanding and was in agreement with this plan. She also understands that She can call clinic at any time with any questions, concerns, or complaints.   Cancer Staging Malignant neoplasm of upper outer quadrant of female breast [facility]) Staging form: Breast, AJCC 8th Edition - Clinical stage from 10/16/2016: Stage IIB (cT2, cN1, cM0, G3, ER: Negative, PR: Positive, HER2: Positive) - Signed by Lloyd Huger, MD on 10/16/2016   Lloyd Huger, MD   04/03/2017 9:41 AM

## 2017-04-04 ENCOUNTER — Ambulatory Visit (INDEPENDENT_AMBULATORY_CARE_PROVIDER_SITE_OTHER): Payer: Medicare HMO | Admitting: General Surgery

## 2017-04-04 ENCOUNTER — Encounter: Payer: Self-pay | Admitting: General Surgery

## 2017-04-04 ENCOUNTER — Encounter: Payer: Medicare HMO | Admitting: General Surgery

## 2017-04-04 VITALS — BP 168/84 | HR 82 | Temp 97.4°F | Wt 160.0 lb

## 2017-04-04 DIAGNOSIS — Z4889 Encounter for other specified surgical aftercare: Secondary | ICD-10-CM

## 2017-04-04 DIAGNOSIS — Z1231 Encounter for screening mammogram for malignant neoplasm of breast: Secondary | ICD-10-CM

## 2017-04-04 MED ORDER — OXYCODONE HCL 5 MG PO TABS: 5.0000 mg | ORAL_TABLET | ORAL | 0 refills | Status: DC | PRN

## 2017-04-04 NOTE — Addendum Note (Signed)
Addended by: Wayna Chalet on: 04/04/2017 02:18 PM   Modules accepted: Orders

## 2017-04-04 NOTE — Progress Notes (Signed)
[id number]

## 2017-04-04 NOTE — Patient Instructions (Signed)
We will see you back in 2 weeks after you have your mammogram.

## 2017-04-04 NOTE — Progress Notes (Signed)
Outpatient Surgical Follow Up  04/04/2017  Krista Cisneros is an 76 y.o. female.   Chief Complaint  Patient presents with  . Routine Post Op    Incarcerated right inguinal hernia-12/3 [place]    HPI: 76 year old female returns to clinic now 2 weeks status post right breast lumpectomy with sentinel lymph node biopsy for breast cancer and now 1 week status post emergent repair of an incarcerated right inguinal hernia.  Patient reports she is doing well.  Still requiring some pain medication parahilar for the groin incision.  She states that it mostly hurts when she sits up with her leg bent for prolonged period of time.  She states she still having issue with with constipation but otherwise is doing well.  She denies any fevers, chills, nausea, vomiting, chest pain, shortness of breath, diarrhea.  Past Medical History:  Diagnosis Date  . Anxiety   . Arthritis   . Cancer [facility])    Right Breast Cancer  . COPD (chronic obstructive pulmonary disease) ([place])   . Depression   . Dyspnea    with exertion  . GERD (gastroesophageal reflux disease)   . Hyperlipidemia   . Hypertension     Past Surgical History:  Procedure Laterality Date  . ABDOMINAL HYSTERECTOMY  1990   Partial  . BREAST BIOPSY Right 10/04/2016   axilla lymph node and axillay tail mass biopsy. invasive mammary carcinoma  . BREAST LUMPECTOMY Right 03/21/2017  . BREAST LUMPECTOMY WITH NEEDLE LOCALIZATION Right 03/21/2017   Procedure: BREAST LUMPECTOMY WITH NEEDLE LOCALIZATION;  Surgeon: Clayburn Pert, MD;  Location: [facility] ORS;  Service: General;  Laterality: Right;  . DILATION AND CURETTAGE OF UTERUS    . INGUINAL HERNIA REPAIR Right 03/26/2017   Procedure: HERNIA REPAIR INGUINAL INCARCERATED;  Surgeon: Jules Husbands, MD;  Location: [facility] ORS;  Service: General;  Laterality: Right;  . PORTACATH PLACEMENT Left 10/24/2016   Procedure: INSERTION PORT-A-CATH;  Surgeon: [name], MD;  Location: [facility] ORS;  Service: General;   Laterality: Left;  . SENTINEL NODE BIOPSY Right 03/21/2017   Procedure: SENTINEL NODE BIOPSY;  Surgeon: Clayburn Pert, MD;  Location: [facility] ORS;  Service: General;  Laterality: Right;    Family History  Problem Relation Age of Onset  . Colon cancer Mother   . Breast cancer Neg Hx     Social History:  reports that she quit smoking about 28 years ago. Her smoking use included cigarettes. She smoked 0.50 packs per day. she has never used smokeless tobacco. She reports that she does not drink alcohol or use drugs.  Allergies:  Allergies  Allergen Reactions  . Penicillins Swelling and Other (See Comments)    Has patient had a PCN reaction causing immediate rash, facial/tongue/throat swelling, SOB or lightheadedness with hypotension: Yes Has patient had a PCN reaction causing severe rash involving mucus membranes or skin necrosis: No Has patient had a PCN reaction that required hospitalization: No Has patient had a PCN reaction occurring within the last 10 years: No If all of the above answers are "NO", then may proceed with Cephalosporin use.   . Atorvastatin Other (See Comments)    Muscle Pain  . Gabapentin Swelling  . Ezetimibe Other (See Comments)    Medications reviewed.    ROS A multipoint review of systems was completed, all pertinent positives and negatives were documented within the HPI and the remainder are negative   BP (!) 168/84   Pulse 82   Temp (!) 97.4 F (36.3 C) (Oral)  Wt 72.6 kg (160 lb)   BMI 29.26 kg/m   Physical Exam  General: No acute distress Neck: Supple nontender Chest: Clear to auscultation Breast: Right breast incision well approximated without any evidence of erythema or drainage.  Axillary incision connects with the breast incision and is also intact without evidence of erythema or drainage. Heart: Regular rate and rhythm Abdomen: Soft, nondistended, appropriately tender to palpation in the right groin at her inguinal incision  site.   Results for orders placed or performed in visit on 04/03/17 (from the past 48 hour(s))  Magnesium     Status: Abnormal   Collection Time: 04/03/17  9:16 AM  Result Value Ref Range   Magnesium 1.6 (L) 1.7 - 2.4 mg/dL  Comprehensive metabolic panel     Status: Abnormal   Collection Time: 04/03/17  9:16 AM  Result Value Ref Range   Sodium 138 135 - 145 mmol/L   Potassium 3.6 3.5 - 5.1 mmol/L   Chloride 104 101 - 111 mmol/L   CO2 25 22 - 32 mmol/L   Glucose, Bld 127 (H) 65 - 99 mg/dL   BUN 16 6 - 20 mg/dL   Creatinine, Ser 1.13 (H) 0.44 - 1.00 mg/dL   Calcium 8.9 8.9 - 10.3 mg/dL   Total Protein 6.9 6.5 - 8.1 g/dL   Albumin 3.0 (L) 3.5 - 5.0 g/dL   AST 23 15 - 41 U/L   ALT 12 (L) 14 - 54 U/L   Alkaline Phosphatase 63 38 - 126 U/L   Total Bilirubin 0.6 0.3 - 1.2 mg/dL   GFR calc non Af Amer 46 (L) >60 mL/min   GFR calc Af Amer 54 (L) >60 mL/min    Comment: (NOTE) The eGFR has been calculated using the CKD EPI equation. This calculation has not been validated in all clinical situations. eGFR's persistently <60 mL/min signify possible Chronic Kidney Disease.    Anion gap 9 5 - 15  CBC with Differential/Platelet     Status: Abnormal   Collection Time: 04/03/17  9:16 AM  Result Value Ref Range   WBC 11.3 (H) 3.6 - 11.0 K/uL   RBC 3.53 (L) 3.80 - 5.20 MIL/uL   Hemoglobin 11.0 (L) 12.0 - 16.0 g/dL   HCT 33.9 (L) 35.0 - 47.0 %   MCV 96.1 80.0 - 100.0 fL   MCH 31.2 26.0 - 34.0 pg   MCHC 32.5 32.0 - 36.0 g/dL   RDW 16.5 (H) 11.5 - 14.5 %   Platelets 327 150 - 440 K/uL   Neutrophils Relative % 58 %   Neutro Abs 6.5 1.4 - 6.5 K/uL   Lymphocytes Relative 26 %   Lymphs Abs 2.9 1.0 - 3.6 K/uL   Monocytes Relative 9 %   Monocytes Absolute 1.1 (H) 0.2 - 0.9 K/uL   Eosinophils Relative 6 %   Eosinophils Absolute 0.7 0 - 0.7 K/uL   Basophils Relative 1 %   Basophils Absolute 0.1 0 - 0.1 K/uL   No results found.  Assessment/Plan:  1. Aftercare following  surgery 76 year old female status post emergent right inguinal hernia repair and scheduled right breast surgery for cancer.  Again reviewed the pathology results from the cancer surgery and that there was no evidence of cancer within any of the specimens.  However, given the difficulty of the surgery and the possibility that the desired specimen was not removed discussed that we will need to repeat images to completely ensure that the area of concern was removed.  Discussed we want to wait at least an additional 2 weeks prior to putting her through mammogram to allow the surgery to continue to heal.  She voiced understanding.  Provided with a refill for her pain medications today and discussed the signs and symptoms of infection and to return to clinic immediately should they occur.  Otherwise she will follow-up in clinic after repeat images in 2-3 weeks.     Clayburn Pert, MD FACS General Surgeon  04/04/2017,2:06 PM

## 2017-04-18 ENCOUNTER — Ambulatory Visit
Admission: RE | Admit: 2017-04-18 | Discharge: 2017-04-18 | Disposition: A | Discharge status: Home / Self Care | Payer: Medicare HMO | Source: Ambulatory Visit | Attending: General Surgery | Admitting: General Surgery

## 2017-04-18 ENCOUNTER — Other Ambulatory Visit: Payer: Self-pay | Admitting: General Surgery

## 2017-04-18 DIAGNOSIS — Z1231 Encounter for screening mammogram for malignant neoplasm of breast: Secondary | ICD-10-CM

## 2017-04-18 IMAGING — MG MM BREAST LOCALIZATION CLIP
8 of 13 series · 8 of 29 positions shown · non-contrast
Comparison: Previous exam(s).

CLINICAL DATA: Patient presents for a diagnostic right breast
examination. Patient had a ultrasound-guided core biopsy of an
axillary tail mass and adjacent right axillary lymph node both
positive for cancer in [DATE] with subsequent neoadjuvant
chemotherapy. Recent right lumpectomy [DATE] demonstrates no
malignancy within the specimen.

EXAM:
2D DIGITAL DIAGNOSTIC right MAMMOGRAM WITH ADJUNCT TOMO
ULTRASOUND right BREAST

[R MLO synth-2D (1 of 3)]
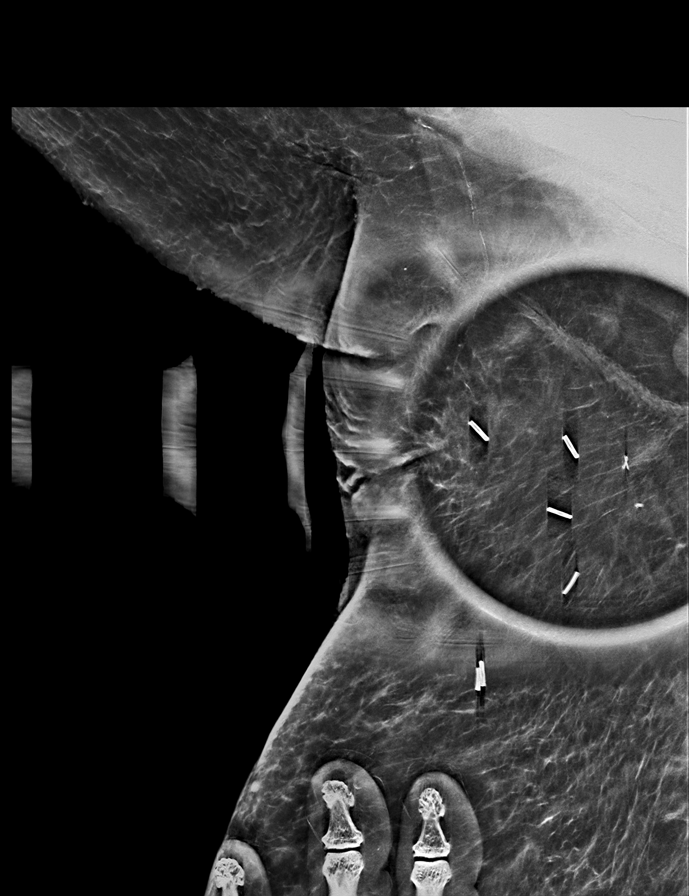

[R MLO (1 of 3)]
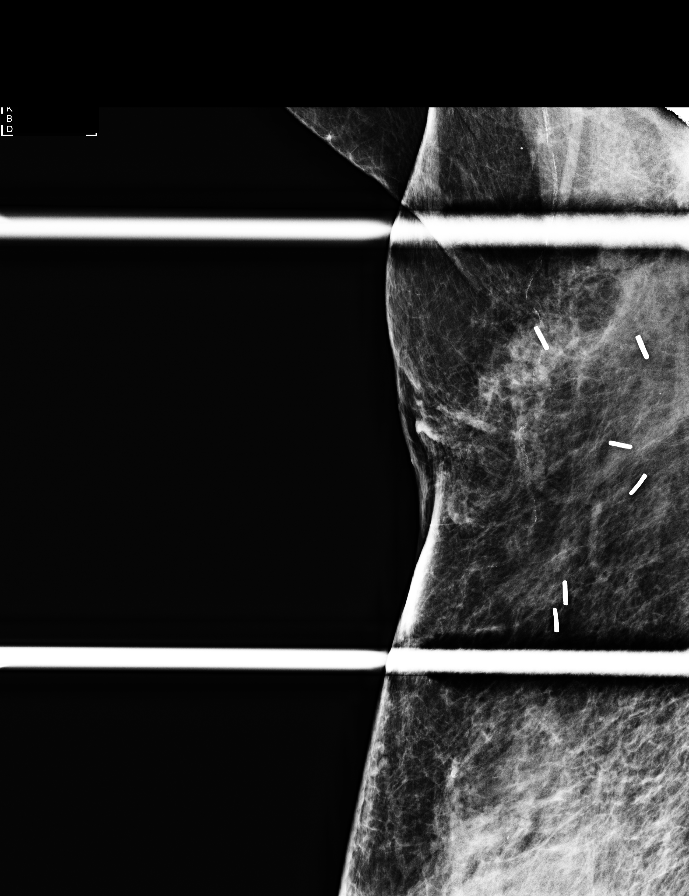

[R MLO synth-2D (2 of 3)]
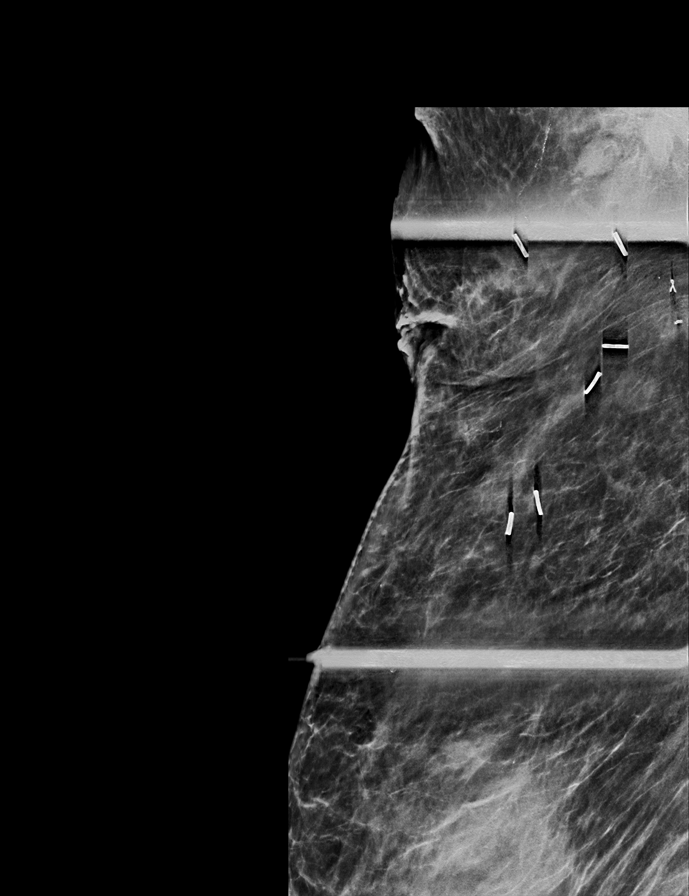

[R MLO (2 of 3)]
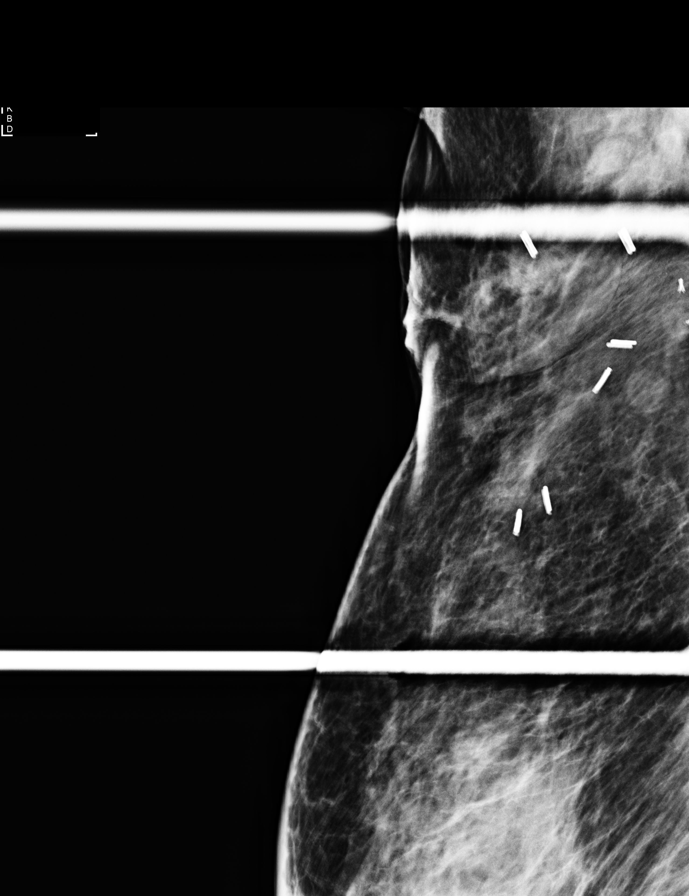

[R MLO synth-2D (3 of 3)]
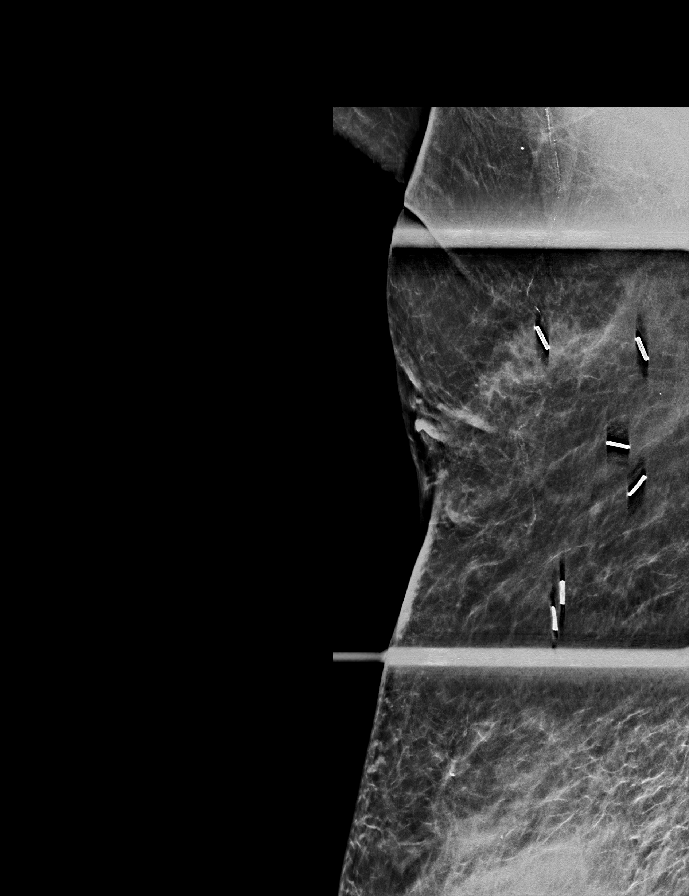

[R XCCL synth-2D]
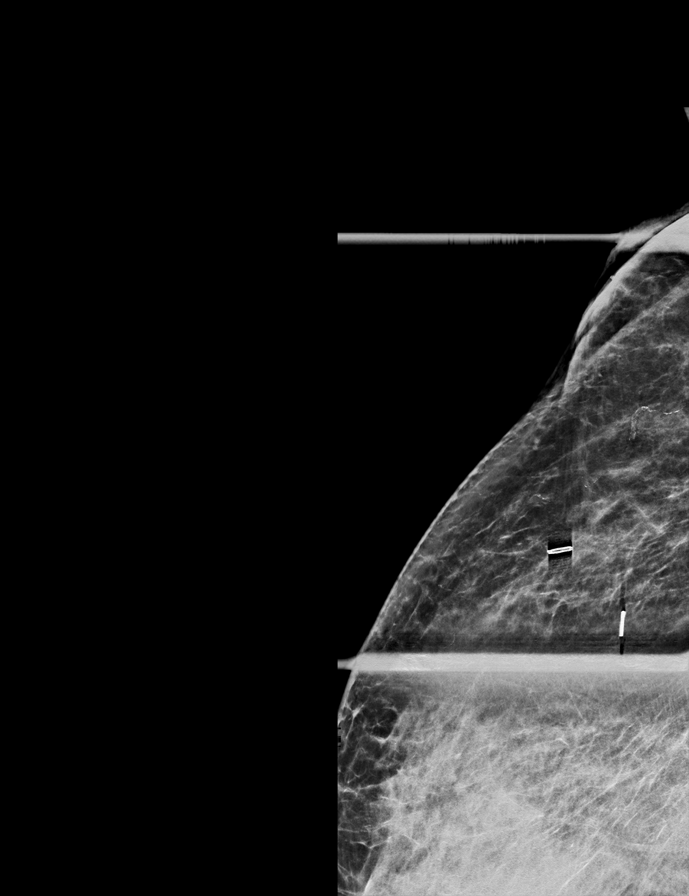

[R MLO (3 of 3)]
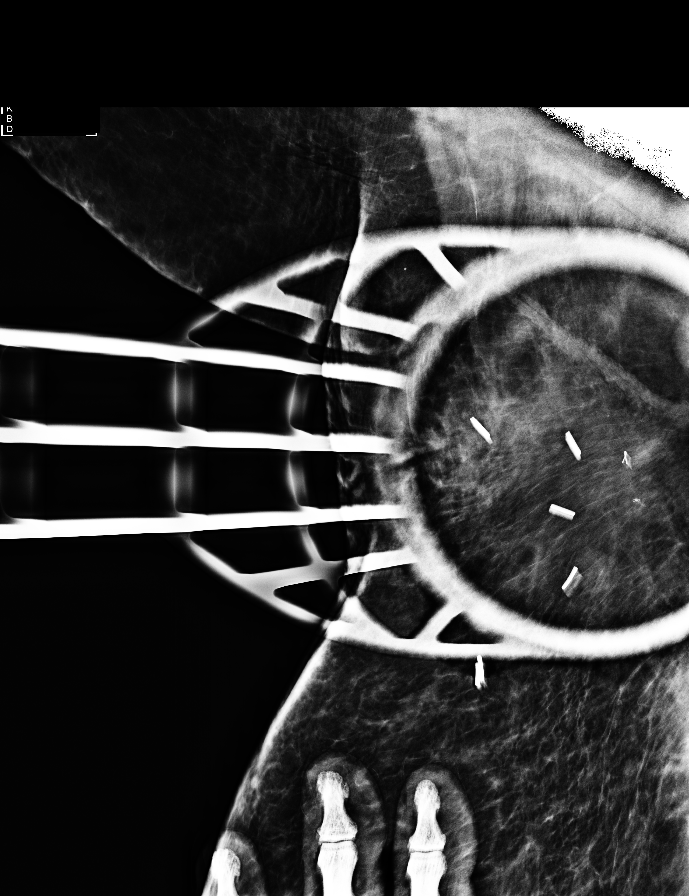

[R XCCL]
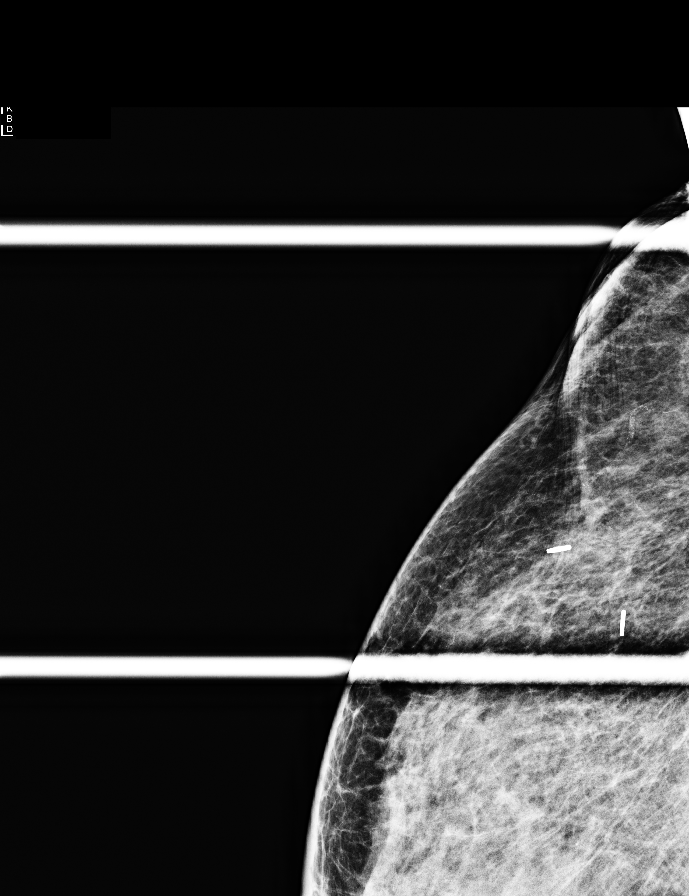

[8 of 29 positions shown; findings below may reference images not displayed]

ACR Breast Density Category c: The breast tissue is heterogeneously
dense, which may obscure small masses.
FINDINGS: Spot tomographic images of the upper outer right breast/axilla were
performed.

There are multiple surgical clips over the upper outer right breast
compatible patient's recent lumpectomy. The ribbon shaped metallic
clip from patient's biopsy-proven malignant right breast mass is
still evident within the right axilla as is the adjacent DUENAS
shaped metallic clip from the biopsy-proven metastatic lymph node.

Targeted ultrasound is performed, showing the biopsy-proven
metastatic lymph node containing HydroMARK clip over the right
axilla just above the lumpectomy scar. There is equivocal
visualization of the biopsy-proven adjacent right breast cancer with
ribbon shaped metallic clip.
IMPRESSION: Evidence of the ribbon shaped metallic clip and adjacent DUENAS
shaped metallic clip from patient's previous biopsy-proven malignant
right breast axillary tail mass and adjacent metastatic axillary
lymph node. The biopsy-proven metastatic lymph node is also seen
sonographically containing HydroMARK clip as there is equivocal
sonographic visualization of the adjacent biopsy prove malignancy
containing ribbon shaped clip.

RECOMMENDATION:
Recommend continued management per clinical treatment plan. Patient
has a surgical appointment with Dr. DUENAS tomorrow.

I have discussed the findings and recommendations with the patient.
Results were also provided in writing at the conclusion of the
visit. If applicable, a reminder letter will be sent to the patient
regarding the next appointment.

BI-RADS CATEGORY  6: Known biopsy-proven malignancy.

## 2017-04-19 ENCOUNTER — Telehealth: Payer: Self-pay

## 2017-04-19 ENCOUNTER — Encounter: Payer: Medicare HMO | Attending: Oncology

## 2017-04-19 ENCOUNTER — Encounter: Payer: Self-pay | Admitting: General Surgery

## 2017-04-20 ENCOUNTER — Other Ambulatory Visit: Payer: Self-pay | Admitting: *Deleted

## 2017-04-20 DIAGNOSIS — C50919 Malignant neoplasm of unspecified site of unspecified female breast: Secondary | ICD-10-CM

## 2017-04-20 NOTE — Progress Notes (Unsigned)
c 

## 2017-04-20 NOTE — Telephone Encounter (Signed)
error 

## 2017-04-21 ENCOUNTER — Other Ambulatory Visit: Payer: Self-pay | Admitting: Oncology

## 2017-04-21 NOTE — Progress Notes (Signed)
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  INTERVAL HISTORY: Patient returns to clinic today for further evaluation and continuation of Herceptin.  She continues to have abdominal pain at the site of site, but otherwise feels well.  She has no neurologic complaints. She denies any fevers. She has no chest pain, cough, or shortness of breath. She denies any nausea, vomiting, constipation, or diarrhea.  She has no abdominal pain.  She has no urinary complaints. Patient offers no further specific complaints.  REVIEW OF SYSTEMS:   Review of Systems  Constitutional: Negative.  Negative for fever, malaise/fatigue and weight loss.  Respiratory: Negative for cough and shortness of breath.   Cardiovascular: Negative.  Negative for chest pain and leg swelling.  Gastrointestinal: Negative.  Negative for abdominal pain, diarrhea and nausea.  Genitourinary: Negative.   Musculoskeletal: Negative.  Negative for joint pain.  Skin: Negative.  Negative for rash.  Neurological: Negative.  Negative for sensory change and weakness.  Psychiatric/Behavioral: Negative.  The patient is not nervous/anxious.     As per HPI. Otherwise, a complete review of systems is negative.  PAST MEDICAL HISTORY: Past Medical History:  Diagnosis Date  . Anxiety   . Arthritis   . Cancer [facility])    Right Breast Cancer  . COPD (chronic obstructive pulmonary disease) ([place])   . Depression   . Dyspnea    with exertion  . GERD (gastroesophageal reflux disease)   . Hyperlipidemia   . Hypertension     PAST SURGICAL HISTORY: Past Surgical History:    Procedure Laterality Date  . ABDOMINAL HYSTERECTOMY  1990   Partial  . BREAST BIOPSY Right 10/04/2016   axilla lymph node and axillay tail mass biopsy. invasive mammary carcinoma  . BREAST LUMPECTOMY Right 03/21/2017  . BREAST LUMPECTOMY WITH NEEDLE LOCALIZATION Right 03/21/2017   Procedure: BREAST LUMPECTOMY WITH NEEDLE LOCALIZATION;  Surgeon: Clayburn Pert, MD;  Location: [facility] ORS;  Service: General;  Laterality: Right;  . DILATION AND CURETTAGE OF UTERUS    . INGUINAL HERNIA REPAIR Right 03/26/2017   Procedure: HERNIA REPAIR INGUINAL INCARCERATED;  Surgeon: Jules Husbands, MD;  Location: [facility] ORS;  Service: General;  Laterality: Right;  . PORTACATH PLACEMENT Left 10/24/2016   Procedure: INSERTION PORT-A-CATH;  Surgeon: [name], MD;  Location: [facility] ORS;  Service: General;  Laterality: Left;  . SENTINEL NODE BIOPSY Right 03/21/2017   Procedure: SENTINEL NODE BIOPSY;  Surgeon: Clayburn Pert, MD;  Location: [facility] ORS;  Service: General;  Laterality: Right;    FAMILY HISTORY: Family History  Problem Relation Age of Onset  . Colon cancer Mother   . Breast cancer Neg Hx     ADVANCED DIRECTIVES (Y/N):  N  HEALTH MAINTENANCE: Social History   Tobacco Use  . Smoking status: Former Smoker    Packs/day: 0.50    Types: Cigarettes    Last attempt to quit: 07/23/1988    Years since quitting: 28.7  . Smokeless tobacco: Never Used  Substance Use Topics  . Alcohol use: No  . Drug use: No     Colonoscopy:  PAP:  Bone density:  Lipid panel:  Allergies  Allergen Reactions  . Penicillins Swelling  and Other (See Comments)    Has patient had a PCN reaction causing immediate rash, facial/tongue/throat swelling, SOB or lightheadedness with hypotension: Yes Has patient had a PCN reaction causing severe rash involving mucus membranes or skin necrosis: No Has patient had a PCN reaction that required hospitalization: No Has patient had a PCN reaction occurring within the last 10  years: No If all of the above answers are "NO", then may proceed with Cephalosporin use.   . Atorvastatin Other (See Comments)    Muscle Pain  . Gabapentin Swelling  . Ezetimibe Other (See Comments)    Current Outpatient Medications  Medication Sig Dispense Refill  . ADVAIR DISKUS 500-50 MCG/DOSE AEPB Inhale 1 puff into the lungs 2 (two) times daily.     Marland Kitchen ALPRAZolam (XANAX) 0.25 MG tablet Take 1 tablet (0.25 mg total) by mouth at bedtime as needed for anxiety. 30 tablet 0  . Aspirin-Caffeine (BAYER BACK & BODY PAIN EX ST PO) Take 2 tablets daily as needed by mouth (for back pain).    . baclofen (LIORESAL) 10 MG tablet Take 10 mg by mouth at bedtime.  9  . co-enzyme Q-10 30 MG capsule Take 30 mg by mouth daily.    . diphenoxylate-atropine (LOMOTIL) 2.5-0.025 MG tablet Take 1 tablet by mouth 4 (four) times daily as needed for diarrhea or loose stools. 30 tablet 1  . DULoxetine (CYMBALTA) 60 MG capsule Take 1 capsule by mouth 1 day or 1 dose.  11  . lidocaine-prilocaine (EMLA) cream Apply 1 application as needed topically (for port access).    Marland Kitchen losartan-hydrochlorothiazide (HYZAAR) 100-25 MG tablet Take 1 tablet by mouth daily.     . montelukast (SINGULAIR) 10 MG tablet Take 10 mg by mouth daily.     . Omega-3 Fatty Acids (FISH OIL PO) Take 1 capsule by mouth daily.     Marland Kitchen omeprazole (PRILOSEC) 20 MG capsule Take 20 mg by mouth daily.    Marland Kitchen oxyCODONE (OXY IR/ROXICODONE) 5 MG immediate release tablet Take 1-2 tablets (5-10 mg total) by mouth every 4 (four) hours as needed for moderate pain or severe pain. 30 tablet 0  . oxyCODONE (ROXICODONE) 5 MG immediate release tablet Take 1 tablet (5 mg total) by mouth every 4 (four) hours as needed for severe pain. 30 tablet 0  . potassium chloride SA (K-DUR,KLOR-CON) 20 MEQ tablet Take 2 tablets (40 mEq total) by mouth daily. This is in addition to ur regular dose of 1mq daily (Patient taking differently: Take 40 mEq daily by mouth. ) 6 tablet 0  .  promethazine (PHENERGAN) 25 MG tablet TAKE 25 MG BY MOUTH EVERY 6-8 HOURS AS NEEDED FOR NAUSEA  2  . triamcinolone ointment (KENALOG) 0.5 % Apply 1 application topically 2 (two) times daily. Rash on bilateral upper extremities 30 g 0   No current facility-administered medications for this visit.    Facility-Administered Medications Ordered in Other Visits  Medication Dose Route Frequency Provider Last Rate Last Dose  . 0.9 %  sodium chloride infusion   Intravenous Once FLloyd Huger MD      . 0.9 %  sodium chloride infusion   Intravenous Once FLloyd Huger MD      . 0.9 %  sodium chloride infusion   Intravenous Once FLloyd Huger MD      . 0.9 %  sodium chloride infusion   Intravenous Continuous FLloyd Huger MD      . 0.9 %  sodium chloride infusion  Intravenous Continuous Lloyd Huger, MD 10 mL/hr at 04/23/17 1000    . ondansetron (ZOFRAN) 8 mg in sodium chloride 0.9 % 50 mL IVPB   Intravenous Once Lloyd Huger, MD      . sodium chloride flush (NS) 0.9 % injection 10 mL  10 mL Intravenous PRN Lloyd Huger, MD   10 mL at 04/23/17 0859    OBJECTIVE: Vitals:   04/23/17 0917  BP: (!) 160/84  Pulse: 81  Resp: 18  Temp: 98.5 F (36.9 C)     Body mass index is 28.75 kg/m.    ECOG FS:0 - Asymptomatic  General: Well-developed, well-nourished, no acute distress. Eyes: Pink conjunctiva, anicteric sclera. Breasts: Exam deferred today. Lungs: Clear to auscultation bilaterally. Heart: Regular rate and rhythm. No rubs, murmurs, or gallops. Abdomen: Soft, nontender, nondistended. No organomegaly noted, normoactive bowel sounds. Musculoskeletal: Non-pitting edema in BLE. No cyanosis, or clubbing. Neuro: Alert, answering all questions appropriately. Cranial nerves grossly intact. Skin: No rashes or petechiae noted. Psych: Normal affect.   LAB RESULTS:  Lab Results  Component Value Date   NA 139 04/23/2017   K 3.4 (L) 04/23/2017   CL 104  04/23/2017   CO2 27 04/23/2017   GLUCOSE 120 (H) 04/23/2017   BUN 20 04/23/2017   CREATININE 1.09 (H) 04/23/2017   CALCIUM 8.9 04/23/2017   PROT 6.9 04/23/2017   ALBUMIN 3.3 (L) 04/23/2017   AST 24 04/23/2017   ALT 10 (L) 04/23/2017   ALKPHOS 59 04/23/2017   BILITOT 0.5 04/23/2017   GFRNONAA 48 (L) 04/23/2017   GFRAA 56 (L) 04/23/2017    Lab Results  Component Value Date   WBC 8.7 04/23/2017   NEUTROABS 4.6 04/23/2017   HGB 11.8 (L) 04/23/2017   HCT 35.5 04/23/2017   MCV 93.3 04/23/2017   PLT 279 04/23/2017     STUDIES: Ct Abdomen Pelvis W Contrast  Result Date: 03/26/2017 CLINICAL DATA:  Lower abdominal pain starting 1 hour prior to admission. Nausea. Denies vomiting or diarrhea. History of breast cancer. EXAM: CT ABDOMEN AND PELVIS WITH CONTRAST TECHNIQUE: Multidetector CT imaging of the abdomen and pelvis was performed using the standard protocol following bolus administration of intravenous contrast. CONTRAST:  156m ISOVUE-300 IOPAMIDOL (ISOVUE-300) INJECTION 61% COMPARISON:  04/09/2014 FINDINGS: Lower chest: Normal size heart without pericardial effusion. Moderate-sized hiatal hernia. Atelectasis at each lung base. Hepatobiliary: No focal liver abnormality is seen. No gallstones, gallbladder wall thickening, or biliary dilatation. Pancreas: Unremarkable. No pancreatic ductal dilatation or surrounding inflammatory changes. Spleen: Normal in size without focal abnormality. Adrenals/Urinary Tract: Normal bilateral adrenal glands. Right upper pole simple appearing renal cyst measuring 2 cm in diameter. No nephrolithiasis or obstructive uropathy. The urinary bladder is unremarkable. Stomach/Bowel: Omental fat, fluid and small bowel containing right inguinal hernia with fluid distention seen within the herniated loop of small intestine up to 2.3 cm in caliber. Correlate to determine whether this can be reduced as incarceration is not excluded given the fluid distention. Normal small  bowel rotation without small bowel obstruction. Moderate fecal residue throughout the colon. Vascular/Lymphatic: Moderate aortoiliac atherosclerosis. No aneurysm. Reproductive: Status post hysterectomy. No adnexal masses. Other: No free air.  No abdominopelvic ascites. Musculoskeletal: Degenerative disc disease L4-5 with discogenic sclerosis of the endplates. Mild levoconvex curvature of the lumbar spine. IMPRESSION: 1. Right-sided inguinal hernia containing omental fat, fluid and a short segment of small intestine. Fluid-filled distention of the herniated small-bowel is noted. This raises concern for possible small bowel incarceration. 2. 2  cm right upper pole simple renal cyst, stable since prior. 3. Moderate-sized hiatal hernia. 4. Aortoiliac atherosclerosis without aneurysm. 5. Levoscoliosis with discogenic endplate sclerosis secondary to moderate-to-marked degenerative disc disease at L4-5. Electronically Signed   By: Ashley Royalty M.D.   On: 03/26/2017 21:41   US Breast Ltd Uni Right Inc Axilla  Result Date: 04/18/2017 CLINICAL DATA:  Patient presents for a diagnostic right breast examination. Patient had a ultrasound-guided core biopsy of an axillary tail mass and adjacent right axillary lymph node both positive for cancer in June 2018 with subsequent neoadjuvant chemotherapy. Recent right lumpectomy November 2018 demonstrates no malignancy within the specimen. EXAM: 2D DIGITAL DIAGNOSTIC right MAMMOGRAM WITH ADJUNCT TOMO ULTRASOUND right BREAST COMPARISON:  Previous exam(s). ACR Breast Density Category c: The breast tissue is heterogeneously dense, which may obscure small masses. FINDINGS: Spot tomographic images of the upper outer right breast/axilla were performed. There are multiple surgical clips over the upper outer right breast compatible patient's recent lumpectomy. The ribbon shaped metallic clip from patient's biopsy-proven malignant right breast mass is still evident within the right axilla as  is the adjacent spring shaped metallic clip from the biopsy-proven metastatic lymph node. Targeted ultrasound is performed, showing the biopsy-proven metastatic lymph node containing HydroMARK clip over the right axilla just above the lumpectomy scar. There is equivocal visualization of the biopsy-proven adjacent right breast cancer with ribbon shaped metallic clip. IMPRESSION: Evidence of the ribbon shaped metallic clip and adjacent spring shaped metallic clip from patient's previous biopsy-proven malignant right breast axillary tail mass and adjacent metastatic axillary lymph node. The biopsy-proven metastatic lymph node is also seen sonographically containing HydroMARK clip as there is equivocal sonographic visualization of the adjacent biopsy prove malignancy containing ribbon shaped clip. RECOMMENDATION: Recommend continued management per clinical treatment plan. Patient has a surgical appointment with Dr. Adonis Huguenin tomorrow. I have discussed the findings and recommendations with the patient. Results were also provided in writing at the conclusion of the visit. If applicable, a reminder letter will be sent to the patient regarding the next appointment. BI-RADS CATEGORY  6: Known biopsy-proven malignancy. Electronically Signed   By: Marin Olp M.D.   On: 04/18/2017 10:57   Mm Clip Placement Right  Result Date: 04/18/2017 CLINICAL DATA:  Patient presents for a diagnostic right breast examination. Patient had a ultrasound-guided core biopsy of an axillary tail mass and adjacent right axillary lymph node both positive for cancer in June 2018 with subsequent neoadjuvant chemotherapy. Recent right lumpectomy November 2018 demonstrates no malignancy within the specimen. EXAM: 2D DIGITAL DIAGNOSTIC right MAMMOGRAM WITH ADJUNCT TOMO ULTRASOUND right BREAST COMPARISON:  Previous exam(s). ACR Breast Density Category c: The breast tissue is heterogeneously dense, which may obscure small masses. FINDINGS: Spot  tomographic images of the upper outer right breast/axilla were performed. There are multiple surgical clips over the upper outer right breast compatible patient's recent lumpectomy. The ribbon shaped metallic clip from patient's biopsy-proven malignant right breast mass is still evident within the right axilla as is the adjacent spring shaped metallic clip from the biopsy-proven metastatic lymph node. Targeted ultrasound is performed, showing the biopsy-proven metastatic lymph node containing HydroMARK clip over the right axilla just above the lumpectomy scar. There is equivocal visualization of the biopsy-proven adjacent right breast cancer with ribbon shaped metallic clip. IMPRESSION: Evidence of the ribbon shaped metallic clip and adjacent spring shaped metallic clip from patient's previous biopsy-proven malignant right breast axillary tail mass and adjacent metastatic axillary lymph node. The biopsy-proven metastatic lymph node  is also seen sonographically containing HydroMARK clip as there is equivocal sonographic visualization of the adjacent biopsy prove malignancy containing ribbon shaped clip. RECOMMENDATION: Recommend continued management per clinical treatment plan. Patient has a surgical appointment with Dr. Adonis Huguenin tomorrow. I have discussed the findings and recommendations with the patient. Results were also provided in writing at the conclusion of the visit. If applicable, a reminder letter will be sent to the patient regarding the next appointment. BI-RADS CATEGORY  6: Known biopsy-proven malignancy. Electronically Signed   By: Marin Olp M.D.   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  PLAN:    1. 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  Treatment was discontinued secondary to poor performance status.  Patient underwent her lumpectomy on March 21, 2017 which appears to be a complete pathologic response, but no biopsy clip was seen in her surgical specimen.  Recent ultrasound identified her biopsy clip and she has an appointment with surgery next week to discuss her options.  MUGA scan from January 19, 2017 revealed an EF of 68% which is unchanged from previous.  Unfortunately, patient missed her most recent MUGA scan and she is outside her 40-monthwindow.  Given the PR positivity of her tumor she will benefit from an aromatase inhibitor for 5 years.  Delay cycle 8 of 18 of maintenance Herceptin today secondary to no recent MUGA scan on file.  She will have the MUGA scan 1 week from today and then return to clinic the following day for reconsideration of treatment.   2. Acute renal failure: Resolved. Monitor. 3. Nausea: Patient does not complain of this today.  Continue current antiemetics as ordered. 4. Hypokalemia: Resolved. Continue oral potassium supplementation as prescribed. 5. Hypomagnesemia: Continue oral supplementation.  Patient will receive 2 g IV magnesium today. 6.  Incarcerated hernia: Continue follow-up with surgery as scheduled.  Patient was given a refill for oxycodone today.  Approximately 30 minutes was spent in discussion of which greater than 50% was consultation.  Patient expressed understanding and was in agreement with this plan. She also understands that She can call clinic at any time with any questions, concerns, or complaints.   Cancer Staging Malignant neoplasm of upper outer quadrant of female breast ([facility] Staging form: Breast, AJCC 8th Edition - Clinical stage from 10/16/2016: Stage IIB (cT2, cN1, cM0, G3, ER: Negative, PR: Positive, HER2: Positive) - Signed by FLloyd Huger MD on 10/16/2016   TLloyd Huger MD   04/23/2017 1:02 PM

## 2017-04-23 ENCOUNTER — Inpatient Hospital Stay (HOSPITAL_BASED_OUTPATIENT_CLINIC_OR_DEPARTMENT_OTHER): Payer: Medicare HMO | Admitting: Oncology

## 2017-04-23 ENCOUNTER — Inpatient Hospital Stay: Payer: Medicare HMO

## 2017-04-23 VITALS — BP 160/84 | HR 81 | Temp 98.5°F | Resp 18 | Wt 157.2 lb

## 2017-04-23 DIAGNOSIS — C50919 Malignant neoplasm of unspecified site of unspecified female breast: Secondary | ICD-10-CM

## 2017-04-23 DIAGNOSIS — C50411 Malignant neoplasm of upper-outer quadrant of right female breast: Secondary | ICD-10-CM | POA: Diagnosis not present

## 2017-04-23 DIAGNOSIS — Z171 Estrogen receptor negative status [ER-]: Secondary | ICD-10-CM

## 2017-04-23 DIAGNOSIS — K449 Diaphragmatic hernia without obstruction or gangrene: Secondary | ICD-10-CM | POA: Diagnosis not present

## 2017-04-23 DIAGNOSIS — Z79899 Other long term (current) drug therapy: Secondary | ICD-10-CM

## 2017-04-23 DIAGNOSIS — Z5111 Encounter for antineoplastic chemotherapy: Secondary | ICD-10-CM | POA: Diagnosis not present

## 2017-04-23 LAB — CBC WITH DIFFERENTIAL/PLATELET
BASOS ABS: 0.1 10*3/uL (ref 0–0.1)
Basophils Relative: 1 %
Eosinophils Absolute: 0.4 10*3/uL (ref 0–0.7)
Eosinophils Relative: 5 %
HEMATOCRIT: 35.5 % (ref 35.0–47.0)
Hemoglobin: 11.8 g/dL — ABNORMAL LOW (ref 12.0–16.0)
LYMPHS PCT: 32 %
Lymphs Abs: 2.7 10*3/uL (ref 1.0–3.6)
MCH: 30.8 pg (ref 26.0–34.0)
MCHC: 33.1 g/dL (ref 32.0–36.0)
MCV: 93.3 fL (ref 80.0–100.0)
MONO ABS: 0.9 10*3/uL (ref 0.2–0.9)
Monocytes Relative: 10 %
NEUTROS ABS: 4.6 10*3/uL (ref 1.4–6.5)
Neutrophils Relative %: 52 %
Platelets: 279 10*3/uL (ref 150–440)
RBC: 3.81 MIL/uL (ref 3.80–5.20)
RDW: 15.2 % — ABNORMAL HIGH (ref 11.5–14.5)
WBC: 8.7 10*3/uL (ref 3.6–11.0)

## 2017-04-23 LAB — COMPREHENSIVE METABOLIC PANEL
ALBUMIN: 3.3 g/dL — AB (ref 3.5–5.0)
ALT: 10 U/L — ABNORMAL LOW (ref 14–54)
ANION GAP: 8 (ref 5–15)
AST: 24 U/L (ref 15–41)
Alkaline Phosphatase: 59 U/L (ref 38–126)
BILIRUBIN TOTAL: 0.5 mg/dL (ref 0.3–1.2)
BUN: 20 mg/dL (ref 6–20)
CO2: 27 mmol/L (ref 22–32)
Calcium: 8.9 mg/dL (ref 8.9–10.3)
Chloride: 104 mmol/L (ref 101–111)
Creatinine, Ser: 1.09 mg/dL — ABNORMAL HIGH (ref 0.44–1.00)
GFR calc Af Amer: 56 mL/min — ABNORMAL LOW (ref >60)
GFR calc non Af Amer: 48 mL/min — ABNORMAL LOW (ref >60)
GLUCOSE: 120 mg/dL — AB (ref 65–99)
POTASSIUM: 3.4 mmol/L — AB (ref 3.5–5.1)
Sodium: 139 mmol/L (ref 135–145)
TOTAL PROTEIN: 6.9 g/dL (ref 6.5–8.1)

## 2017-04-23 LAB — MAGNESIUM: Magnesium: 1.4 mg/dL — ABNORMAL LOW (ref 1.7–2.4)

## 2017-04-23 MED ORDER — OXYCODONE HCL 5 MG PO TABS: 5.0000 mg | ORAL_TABLET | ORAL | 0 refills | Status: DC | PRN

## 2017-04-23 MED ORDER — SODIUM CHLORIDE 0.9 % IV SOLN
INTRAVENOUS | Status: DC
  Administered 2017-04-23: 10:00:00 via INTRAVENOUS
  Filled 2017-04-23: qty 1000

## 2017-04-23 MED ORDER — HEPARIN SOD (PORK) LOCK FLUSH 100 UNIT/ML IV SOLN
500.0000 [IU] | Freq: Once | INTRAVENOUS | Status: AC
  Administered 2017-04-23: 500 [IU] via INTRAVENOUS

## 2017-04-23 MED ORDER — HEPARIN SOD (PORK) LOCK FLUSH 100 UNIT/ML IV SOLN
INTRAVENOUS | Status: AC
  Filled 2017-04-23: qty 5

## 2017-04-23 MED ORDER — MAGNESIUM SULFATE 2 GM/50ML IV SOLN
2.0000 g | Freq: Once | INTRAVENOUS | Status: AC
  Administered 2017-04-23: 2 g via INTRAVENOUS
  Filled 2017-04-23: qty 50

## 2017-04-23 MED ORDER — SODIUM CHLORIDE 0.9% FLUSH
10.0000 mL | INTRAVENOUS | Status: DC | PRN
Start: 2017-04-23 — End: 2017-04-23
  Administered 2017-04-23: 10 mL via INTRAVENOUS
  Filled 2017-04-23: qty 10

## 2017-04-23 MED ORDER — MAGNESIUM SULFATE 50 % IJ SOLN: 2.0000 g | Freq: Once | INTRAMUSCULAR | Status: DC

## 2017-04-23 NOTE — Progress Notes (Signed)
Patient is here today for follow up, she is doing well. She does mention she is having some continuing groin pain. She also mentioned she missed her follow up for post op and they have rescheduled for next week but would like to ask about pain meds to help until her appointment next week. Also she missed her MUGA appt.

## 2017-04-24 DIAGNOSIS — Z923 Personal history of irradiation: Secondary | ICD-10-CM

## 2017-04-24 HISTORY — DX: Personal history of irradiation: Z92.3

## 2017-04-29 ENCOUNTER — Other Ambulatory Visit: Payer: Self-pay | Admitting: Oncology

## 2017-04-29 NOTE — Progress Notes (Signed)
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  INTERVAL HISTORY: Patient returns to clinic today for further evaluation and continuation of Herceptin.  She currently feels well and is asymptomatic.  She is now fully recovered from her abdominal surgery.  She has no neurologic complaints. She denies any fevers. She has no chest pain, cough, or shortness of breath. She denies any nausea, vomiting, constipation, or diarrhea.  She has no abdominal pain.  She has no urinary complaints. Patient offers no specific complaints today.  REVIEW OF SYSTEMS:   Review of Systems  Constitutional: Negative.  Negative for fever, malaise/fatigue and weight loss.  Respiratory: Negative for cough and shortness of breath.   Cardiovascular: Negative.  Negative for chest pain and leg swelling.  Gastrointestinal: Negative.  Negative for abdominal pain, diarrhea and nausea.  Genitourinary: Negative.   Musculoskeletal: Negative.  Negative for joint pain.  Skin: Negative.  Negative for rash.  Neurological: Negative.  Negative for sensory change and weakness.  Psychiatric/Behavioral: Negative.  The patient is not nervous/anxious.     As per HPI. Otherwise, a complete review of systems is negative.  PAST MEDICAL HISTORY: Past Medical History:  Diagnosis Date  . Anxiety   . Arthritis   . Cancer [facility])    Right Breast Cancer  . COPD (chronic obstructive pulmonary disease) ([place])   . Depression   . Dyspnea    with exertion  . GERD (gastroesophageal reflux disease)   . Hyperlipidemia   . Hypertension     PAST SURGICAL HISTORY: Past  Surgical History:  Procedure Laterality Date  . ABDOMINAL HYSTERECTOMY  1990   Partial  . BREAST BIOPSY Right 10/04/2016   axilla lymph node and axillay tail mass biopsy. invasive mammary carcinoma  . BREAST LUMPECTOMY Right 03/21/2017  . BREAST LUMPECTOMY WITH NEEDLE LOCALIZATION Right 03/21/2017   Procedure: BREAST LUMPECTOMY WITH NEEDLE LOCALIZATION;  Surgeon: Clayburn Pert, MD;  Location: [facility] ORS;  Service: General;  Laterality: Right;  . DILATION AND CURETTAGE OF UTERUS    . INGUINAL HERNIA REPAIR Right 03/26/2017   Procedure: HERNIA REPAIR INGUINAL INCARCERATED;  Surgeon: Jules Husbands, MD;  Location: [facility] ORS;  Service: General;  Laterality: Right;  . PORTACATH PLACEMENT Left 10/24/2016   Procedure: INSERTION PORT-A-CATH;  Surgeon: [name], MD;  Location: [facility] ORS;  Service: General;  Laterality: Left;  . SENTINEL NODE BIOPSY Right 03/21/2017   Procedure: SENTINEL NODE BIOPSY;  Surgeon: Clayburn Pert, MD;  Location: [facility] ORS;  Service: General;  Laterality: Right;    FAMILY HISTORY: Family History  Problem Relation Age of Onset  . Colon cancer Mother   . Breast cancer Neg Hx     ADVANCED DIRECTIVES (Y/N):  N  HEALTH MAINTENANCE: Social History   Tobacco Use  . Smoking status: Former Smoker    Packs/day: 0.50    Types: Cigarettes    Last attempt to quit: 07/23/1988    Years since quitting: 28.8  . Smokeless tobacco: Never Used  Substance Use Topics  . Alcohol use: No  . Drug use: No     Colonoscopy:  PAP:  Bone density:  Lipid panel:  Allergies  Allergen Reactions  . Penicillins  Swelling and Other (See Comments)    Has patient had a PCN reaction causing immediate rash, facial/tongue/throat swelling, SOB or lightheadedness with hypotension: Yes Has patient had a PCN reaction causing severe rash involving mucus membranes or skin necrosis: No Has patient had a PCN reaction that required hospitalization: No Has patient had a PCN reaction occurring  within the last 10 years: No If all of the above answers are "NO", then may proceed with Cephalosporin use.   . Atorvastatin Other (See Comments)    Muscle Pain  . Gabapentin Swelling  . Ezetimibe Other (See Comments)    Current Outpatient Medications  Medication Sig Dispense Refill  . ADVAIR DISKUS 500-50 MCG/DOSE AEPB Inhale 1 puff into the lungs 2 (two) times daily.     Marland Kitchen ALPRAZolam (XANAX) 0.25 MG tablet Take 1 tablet (0.25 mg total) by mouth at bedtime as needed for anxiety. 30 tablet 0  . Aspirin-Caffeine (BAYER BACK & BODY PAIN EX ST PO) Take 2 tablets daily as needed by mouth (for back pain).    . baclofen (LIORESAL) 10 MG tablet Take 10 mg by mouth at bedtime.  9  . co-enzyme Q-10 30 MG capsule Take 30 mg by mouth daily.    . diphenoxylate-atropine (LOMOTIL) 2.5-0.025 MG tablet Take 1 tablet by mouth 4 (four) times daily as needed for diarrhea or loose stools. 30 tablet 1  . DULoxetine (CYMBALTA) 60 MG capsule Take 1 capsule by mouth 1 day or 1 dose.  11  . lidocaine-prilocaine (EMLA) cream Apply 1 application as needed topically (for port access).    Marland Kitchen losartan-hydrochlorothiazide (HYZAAR) 100-25 MG tablet Take 1 tablet by mouth daily.     . montelukast (SINGULAIR) 10 MG tablet Take 10 mg by mouth daily.     . Omega-3 Fatty Acids (FISH OIL PO) Take 1 capsule by mouth daily.     Marland Kitchen omeprazole (PRILOSEC) 20 MG capsule Take 20 mg by mouth daily.    Marland Kitchen oxyCODONE (OXY IR/ROXICODONE) 5 MG immediate release tablet Take 1-2 tablets (5-10 mg total) by mouth every 4 (four) hours as needed for moderate pain or severe pain. 30 tablet 0  . oxyCODONE (ROXICODONE) 5 MG immediate release tablet Take 1 tablet (5 mg total) by mouth every 4 (four) hours as needed for severe pain. 30 tablet 0  . promethazine (PHENERGAN) 25 MG tablet TAKE 25 MG BY MOUTH EVERY 6-8 HOURS AS NEEDED FOR NAUSEA  2  . triamcinolone ointment (KENALOG) 0.5 % Apply 1 application topically 2 (two) times daily. Rash on bilateral  upper extremities 30 g 0  . potassium chloride SA (K-DUR,KLOR-CON) 20 MEQ tablet Take 2 tablets (40 mEq total) by mouth daily. This is in addition to ur regular dose of 36mq daily (Patient not taking: Reported on 05/01/2017) 6 tablet 0   No current facility-administered medications for this visit.    Facility-Administered Medications Ordered in Other Visits  Medication Dose Route Frequency Provider Last Rate Last Dose  . 0.9 %  sodium chloride infusion   Intravenous Once FLloyd Huger MD      . 0.9 %  sodium chloride infusion   Intravenous Once FLloyd Huger MD      . 0.9 %  sodium chloride infusion   Intravenous Once FLloyd Huger MD      . 0.9 %  sodium chloride infusion   Intravenous Continuous FLloyd Huger MD      . ondansetron (ZOFRAN) 8 mg in sodium chloride 0.9 %  50 mL IVPB   Intravenous Once Lloyd Huger, MD        OBJECTIVE: Vitals:   05/01/17 1418  BP: 139/82  Pulse: 74  Resp: 20  Temp: 97.7 F (36.5 C)     Body mass index is 28.57 kg/m.    ECOG FS:0 - Asymptomatic  General: Well-developed, well-nourished, no acute distress. Eyes: Pink conjunctiva, anicteric sclera. Breasts: Exam deferred today. Lungs: Clear to auscultation bilaterally. Heart: Regular rate and rhythm. No rubs, murmurs, or gallops. Abdomen: Soft, nontender, nondistended. No organomegaly noted, normoactive bowel sounds. Musculoskeletal: Mild edema.  No cyanosis, or clubbing. Neuro: Alert, answering all questions appropriately. Cranial nerves grossly intact. Skin: No rashes or petechiae noted. Psych: Normal affect.   LAB RESULTS:  Lab Results  Component Value Date   NA 137 05/01/2017   K 3.4 (L) 05/01/2017   CL 101 05/01/2017   CO2 25 05/01/2017   GLUCOSE 121 (H) 05/01/2017   BUN 20 05/01/2017   CREATININE 1.21 (H) 05/01/2017   CALCIUM 8.9 05/01/2017   PROT 6.9 05/01/2017   ALBUMIN 3.2 (L) 05/01/2017   AST 24 05/01/2017   ALT 11 (L) 05/01/2017   ALKPHOS 66  05/01/2017   BILITOT 0.5 05/01/2017   GFRNONAA 43 (L) 05/01/2017   GFRAA 49 (L) 05/01/2017    Lab Results  Component Value Date   WBC 9.3 05/01/2017   NEUTROABS 4.7 05/01/2017   HGB 11.8 (L) 05/01/2017   HCT 36.3 05/01/2017   MCV 92.2 05/01/2017   PLT 260 05/01/2017     STUDIES: Nm Cardiac Muga Rest  Result Date: 04/30/2017 CLINICAL DATA:  Breast cancer. Evaluate cardiac function in relation to chemotherapy. EXAM: NUCLEAR MEDICINE CARDIAC BLOOD POOL IMAGING (MUGA) TECHNIQUE: Cardiac multi-gated acquisition was performed at rest following intravenous injection of Tc-79mlabeled red blood cells. RADIOPHARMACEUTICALS:  21.9 mCi Tc-9104mDP in-vitro labeled red blood cells IV COMPARISON:  None. FINDINGS: No  focal wall motion abnormality of the left ventricle. Calculated left ventricular ejection fraction equals 67% IMPRESSION: Left ventricular ejection fraction equals 67 %. Electronically Signed   By: StSuzy Bouchard.D.   On: 04/30/2017 15:27   [employer] Uni Right Inc Axilla  Result Date: 04/18/2017 CLINICAL DATA:  Patient presents for a diagnostic right breast examination. Patient had a ultrasound-guided core biopsy of an axillary tail mass and adjacent right axillary lymph node both positive for cancer in June 2018 with subsequent neoadjuvant chemotherapy. Recent right lumpectomy November 2018 demonstrates no malignancy within the specimen. EXAM: 2D DIGITAL DIAGNOSTIC right MAMMOGRAM WITH ADJUNCT TOMO ULTRASOUND right BREAST COMPARISON:  Previous exam(s). ACR Breast Density Category c: The breast tissue is heterogeneously dense, which may obscure small masses. FINDINGS: Spot tomographic images of the upper outer right breast/axilla were performed. There are multiple surgical clips over the upper outer right breast compatible patient's recent lumpectomy. The ribbon shaped metallic clip from patient's biopsy-proven malignant right breast mass is still evident within the right axilla as is  the adjacent spring shaped metallic clip from the biopsy-proven metastatic lymph node. Targeted ultrasound is performed, showing the biopsy-proven metastatic lymph node containing HydroMARK clip over the right axilla just above the lumpectomy scar. There is equivocal visualization of the biopsy-proven adjacent right breast cancer with ribbon shaped metallic clip. IMPRESSION: Evidence of the ribbon shaped metallic clip and adjacent spring shaped metallic clip from patient's previous biopsy-proven malignant right breast axillary tail mass and adjacent metastatic axillary lymph node. The biopsy-proven metastatic lymph node is also seen  sonographically containing HydroMARK clip as there is equivocal sonographic visualization of the adjacent biopsy prove malignancy containing ribbon shaped clip. RECOMMENDATION: Recommend continued management per clinical treatment plan. Patient has a surgical appointment with Dr. Adonis Huguenin tomorrow. I have discussed the findings and recommendations with the patient. Results were also provided in writing at the conclusion of the visit. If applicable, a reminder letter will be sent to the patient regarding the next appointment. BI-RADS CATEGORY  6: Known biopsy-proven malignancy. Electronically Signed   By: Marin Olp M.D.   On: 04/18/2017 10:57   Mm Clip Placement Right  Result Date: 04/18/2017 CLINICAL DATA:  Patient presents for a diagnostic right breast examination. Patient had a ultrasound-guided core biopsy of an axillary tail mass and adjacent right axillary lymph node both positive for cancer in June 2018 with subsequent neoadjuvant chemotherapy. Recent right lumpectomy November 2018 demonstrates no malignancy within the specimen. EXAM: 2D DIGITAL DIAGNOSTIC right MAMMOGRAM WITH ADJUNCT TOMO ULTRASOUND right BREAST COMPARISON:  Previous exam(s). ACR Breast Density Category c: The breast tissue is heterogeneously dense, which may obscure small masses. FINDINGS: Spot  tomographic images of the upper outer right breast/axilla were performed. There are multiple surgical clips over the upper outer right breast compatible patient's recent lumpectomy. The ribbon shaped metallic clip from patient's biopsy-proven malignant right breast mass is still evident within the right axilla as is the adjacent spring shaped metallic clip from the biopsy-proven metastatic lymph node. Targeted ultrasound is performed, showing the biopsy-proven metastatic lymph node containing HydroMARK clip over the right axilla just above the lumpectomy scar. There is equivocal visualization of the biopsy-proven adjacent right breast cancer with ribbon shaped metallic clip. IMPRESSION: Evidence of the ribbon shaped metallic clip and adjacent spring shaped metallic clip from patient's previous biopsy-proven malignant right breast axillary tail mass and adjacent metastatic axillary lymph node. The biopsy-proven metastatic lymph node is also seen sonographically containing HydroMARK clip as there is equivocal sonographic visualization of the adjacent biopsy prove malignancy containing ribbon shaped clip. RECOMMENDATION: Recommend continued management per clinical treatment plan. Patient has a surgical appointment with Dr. Adonis Huguenin tomorrow. I have discussed the findings and recommendations with the patient. Results were also provided in writing at the conclusion of the visit. If applicable, a reminder letter will be sent to the patient regarding the next appointment. BI-RADS CATEGORY  6: Known biopsy-proven malignancy. Electronically Signed   By: Marin Olp M.D.   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  PLAN:    1. 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  Treatment was discontinued secondary to poor performance status.  Patient underwent her lumpectomy on March 21, 2017 which appears to be a complete pathologic response, but no biopsy clip was seen in her surgical specimen.  Recent ultrasound identified her biopsy clip and she has an appointment for repeat surgery to remove the clip.  MUGA scan from April 30, 2017 revealed an EF of 67% which is unchanged from previous. Given the PR positivity of her tumor she will benefit from an aromatase inhibitor for 5 years.  Proceed with cycle 8 of 18 of maintenance Herceptin today.  Return to clinic in 3 weeks for consideration of cycle 9.   2. Acute renal failure: Resolved. Monitor. 3.  Nausea: Patient does not complain of this today.  Continue current antiemetics as ordered. 4. Hypokalemia: Continue oral potassium supplementation as prescribed. 5. Hypomagnesemia: Continue oral supplementation.  Patient does not require IV magnesium today. 6.  Incarcerated hernia: Continue follow-up with surgery as scheduled.    Approximately 30 minutes was spent in discussion of which greater than 50% was consultation.  Patient expressed understanding and was in agreement with this plan. She also understands that She can call clinic at any time with any questions, concerns, or complaints.   Cancer Staging Malignant neoplasm of upper outer quadrant of female breast [facility]) Staging form: Breast, AJCC 8th Edition - Clinical stage from 10/16/2016: Stage IIB (cT2, cN1, cM0, G3, ER: Negative, PR: Positive, HER2: Positive) - Signed by Lloyd Huger, MD on 10/16/2016   Lloyd Huger, MD   05/05/2017 8:30 AM

## 2017-04-30 ENCOUNTER — Ambulatory Visit
Admission: RE | Admit: 2017-04-30 | Discharge: 2017-04-30 | Disposition: A | Discharge status: Home / Self Care | Payer: Medicare HMO | Source: Ambulatory Visit | Attending: Oncology | Admitting: Oncology

## 2017-04-30 ENCOUNTER — Encounter: Payer: Self-pay | Admitting: General Surgery

## 2017-04-30 ENCOUNTER — Ambulatory Visit (INDEPENDENT_AMBULATORY_CARE_PROVIDER_SITE_OTHER): Payer: Medicare HMO | Admitting: General Surgery

## 2017-04-30 VITALS — BP 160/85 | HR 78 | Temp 97.8°F | Ht 62.0 in | Wt 155.2 lb

## 2017-04-30 DIAGNOSIS — C50411 Malignant neoplasm of upper-outer quadrant of right female breast: Secondary | ICD-10-CM | POA: Diagnosis not present

## 2017-04-30 DIAGNOSIS — Z171 Estrogen receptor negative status [ER-]: Secondary | ICD-10-CM

## 2017-04-30 IMAGING — NM NM CARDIA MUGA REST
9 series · 41 of 41 positions shown · non-contrast
Comparison: None.

CLINICAL DATA: Breast cancer. Evaluate cardiac function in relation
to chemotherapy.

EXAM:
NUCLEAR MEDICINE CARDIAC BLOOD POOL IMAGING (MUGA)
TECHNIQUE: Cardiac multi-gated acquisition was performed at rest following
intravenous injection of [RB] labeled red blood cells.
RADIOPHARMACEUTICALS:  21.9 mCi [RB] MDP in-vitro labeled red
blood cells IV

[Series 1000: 70 degree · 3.30mm/px · 1 of 1 slices shown]
[im 1/1]
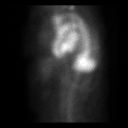

[Series 1000: 45 lao-gated (results) · 3.30mm/px · 6 of 24 frames shown]
[frame 3/24]
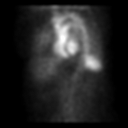
[frame 7/24]
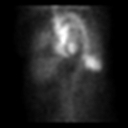
[frame 11/24]
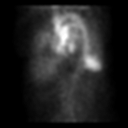
[frame 15/24]
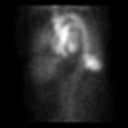
[frame 19/24]
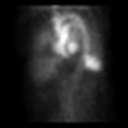
[frame 23/24]
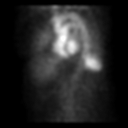

[Series 1000: 45 lao-gated (original with roi) · 3.30mm/px · 6 of 24 frames shown]
[frame 3/24]
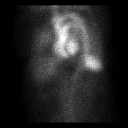
[frame 7/24]
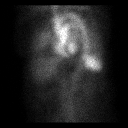
[frame 11/24]
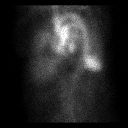
[frame 15/24]
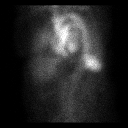
[frame 19/24]
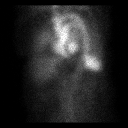
[frame 23/24]
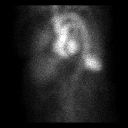

[Series 1000: anterior-gated · 3.30mm/px · 6 of 24 frames shown]
[frame 3/24]
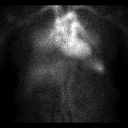
[frame 7/24]
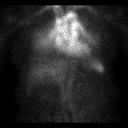
[frame 11/24]
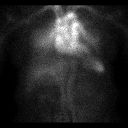
[frame 15/24]
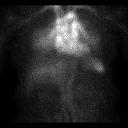
[frame 19/24]
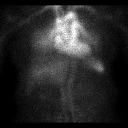
[frame 23/24]
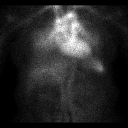

[Series 1000: 45 lao-gated · 3.30mm/px · 6 of 24 frames shown]
[frame 3/24]
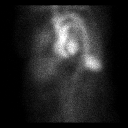
[frame 7/24]
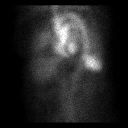
[frame 11/24]
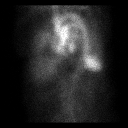
[frame 15/24]
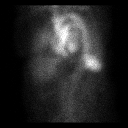
[frame 19/24]
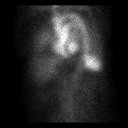
[frame 23/24]
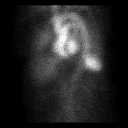

[Series 1000: 45 lao · 3.30mm/px · 1 of 1 slices shown]
[im 1/1]
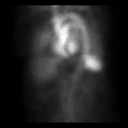

[Series 1000: 70 degree-gated · 3.30mm/px · 6 of 24 frames shown]
[frame 3/24]
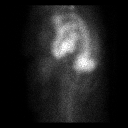
[frame 7/24]
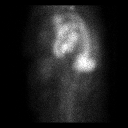
[frame 11/24]
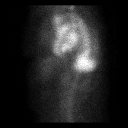
[frame 15/24]
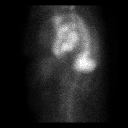
[frame 19/24]
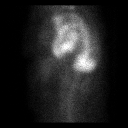
[frame 23/24]
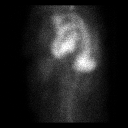

[Series 1000: 45 lao-gated (functional) · 3.30mm/px · 8 of 8 slices shown]
[im 1/8]
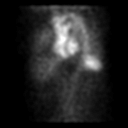
[im 2/8]
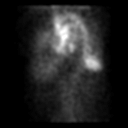
[im 3/8]
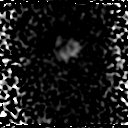
[im 4/8  full-range]
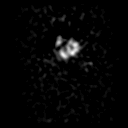
[im 5/8  full-range]
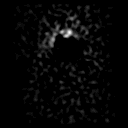
[im 6/8  full-range]
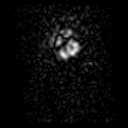
[im 7/8]
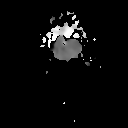
[im 8/8]
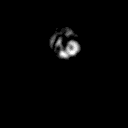

[Series 1000: anterior · 3.30mm/px · 1 of 1 slices shown]
[im 1/1]
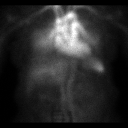

[41 of 41 positions shown; findings below may reference images not displayed]

FINDINGS: No  focal wall motion abnormality of the left ventricle.

Calculated left ventricular ejection fraction equals 67%
IMPRESSION: Left ventricular ejection fraction equals 67 %.

## 2017-04-30 MED ORDER — TECHNETIUM TC 99M-LABELED RED BLOOD CELLS IV KIT
21.8700 | PACK | Freq: Once | INTRAVENOUS | Status: AC | PRN
Start: 2017-04-30 — End: 2017-04-30
  Administered 2017-04-30: 21.87 via INTRAVENOUS

## 2017-04-30 NOTE — Progress Notes (Signed)
Outpatient Surgical Follow Up  04/30/2017  Krista Cisneros is an 77 y.o. female.   Chief Complaint  Patient presents with  . Routine Post Op    Incarcerated Right inguinal Hernia 03/26/17 Right breast lumpectomy w/needle localization 03/21/18 Dr.Larenz Frasier    HPI: 77 year old female returns to clinic for follow-up from HER-2 recent surgeries.  She is now approximately 5 weeks status post right breast lumpectomy.  She is 4 weeks status post open repair of right inguinal hernia.  She reports pain to the right groin that is improving but persist with movement.  She has no complaints of pain to the right breast or axillary incision.  Since her last visit she had repeat images which unfortunately showed the preoperatively placed clips are still within her right breast.  The patient denies any fevers, chills, nausea, vomiting, chest pain, shortness of breath, diarrhea, constipation.  Past Medical History:  Diagnosis Date  . Anxiety   . Arthritis   . Cancer [facility])    Right Breast Cancer  . COPD (chronic obstructive pulmonary disease) ([place])   . Depression   . Dyspnea    with exertion  . GERD (gastroesophageal reflux disease)   . Hyperlipidemia   . Hypertension     Past Surgical History:  Procedure Laterality Date  . ABDOMINAL HYSTERECTOMY  1990   Partial  . BREAST BIOPSY Right 10/04/2016   axilla lymph node and axillay tail mass biopsy. invasive mammary carcinoma  . BREAST LUMPECTOMY Right 03/21/2017  . BREAST LUMPECTOMY WITH NEEDLE LOCALIZATION Right 03/21/2017   Procedure: BREAST LUMPECTOMY WITH NEEDLE LOCALIZATION;  Surgeon: Clayburn Pert, MD;  Location: [facility] ORS;  Service: General;  Laterality: Right;  . DILATION AND CURETTAGE OF UTERUS    . INGUINAL HERNIA REPAIR Right 03/26/2017   Procedure: HERNIA REPAIR INGUINAL INCARCERATED;  Surgeon: Jules Husbands, MD;  Location: [facility] ORS;  Service: General;  Laterality: Right;  . PORTACATH PLACEMENT Left 10/24/2016   Procedure: INSERTION  PORT-A-CATH;  Surgeon: [name], MD;  Location: [facility] ORS;  Service: General;  Laterality: Left;  . SENTINEL NODE BIOPSY Right 03/21/2017   Procedure: SENTINEL NODE BIOPSY;  Surgeon: Clayburn Pert, MD;  Location: [facility] ORS;  Service: General;  Laterality: Right;    Family History  Problem Relation Age of Onset  . Colon cancer Mother   . Breast cancer Neg Hx     Social History:  reports that she quit smoking about 28 years ago. Her smoking use included cigarettes. She smoked 0.50 packs per day. she has never used smokeless tobacco. She reports that she does not drink alcohol or use drugs.  Allergies:  Allergies  Allergen Reactions  . Penicillins Swelling and Other (See Comments)    Has patient had a PCN reaction causing immediate rash, facial/tongue/throat swelling, SOB or lightheadedness with hypotension: Yes Has patient had a PCN reaction causing severe rash involving mucus membranes or skin necrosis: No Has patient had a PCN reaction that required hospitalization: No Has patient had a PCN reaction occurring within the last 10 years: No If all of the above answers are "NO", then may proceed with Cephalosporin use.   . Atorvastatin Other (See Comments)    Muscle Pain  . Gabapentin Swelling  . Ezetimibe Other (See Comments)    Medications reviewed.    ROS A multipoint review of systems was completed, all pertinent positives and negatives were document within the HPI and the remainder are negative   BP (!) 160/85   Pulse 78  Temp 97.8 F (36.6 C) (Oral)   Ht '5\' 2"'$  (1.575 m)   Wt 70.4 kg (155 lb 3.2 oz)   BMI 28.39 kg/m   Physical Exam General: No acute distress Neck: Supple nontender Breast: Right breast/axillary incision well approximated without any evidence of erythema or drainage.  There are no palpable masses or concerning findings on the breast. Chest: Clear to auscultation Heart: Rate and rhythm Abdomen: Soft nondistended, appropriately tender in the  right groin at the inguinal incision.  No evidence of recurrence on exam.    No results found for this or any previous visit (from the past 48 hour(s)). Nm Cardiac Muga Rest  Result Date: 04/30/2017 CLINICAL DATA:  Breast cancer. Evaluate cardiac function in relation to chemotherapy. EXAM: NUCLEAR MEDICINE CARDIAC BLOOD POOL IMAGING (MUGA) TECHNIQUE: Cardiac multi-gated acquisition was performed at rest following intravenous injection of Tc-84mlabeled red blood cells. RADIOPHARMACEUTICALS:  21.9 mCi Tc-936mDP in-vitro labeled red blood cells IV COMPARISON:  None. FINDINGS: No  focal wall motion abnormality of the left ventricle. Calculated left ventricular ejection fraction equals 67% IMPRESSION: Left ventricular ejection fraction equals 67 %. Electronically Signed   By: StSuzy Bouchard.D.   On: 04/30/2017 15:27    Assessment/Plan:  1. Malignant neoplasm of upper-outer quadrant of right breast in female, estrogen receptor negative ([id number] old female with a history of right-sided breast cancer.  Biopsy clips remain within the breast.  Discussed that they appear to be visible on ultrasound per radiology.  Also discussed that I would consult with 1 of my senior surgeons in the area to assess whether or not we could visualize this under ultrasound ourselves in the operating room.  Therefore patient will be seen at [facility] next Tuesday morning with myself and Dr. ByBary Castillao evaluate the area under ultrasound.  At that point if they are visible we will place her on the operative schedule to have the areas removed.     ChClayburn PertMD FACS General Surgeon  04/30/2017,4:52 PM

## 2017-04-30 NOTE — Patient Instructions (Signed)
We would like for you to see Dr.Jeffrey Byrnette next Tuesday 05/08/17 @ 11:00 am.  Please see address listed below.  Dr.Byrnette and Dr.Woodham will be in touch to arrange a date for surgery. Please call our office if you have not heard from anyone within 2 days.   [facility] [place] Please see your blue pre-care sheet for surgery information.

## 2017-04-30 NOTE — H&P (View-Only) (Signed)
Outpatient Surgical Follow Up  04/30/2017  Krista Cisneros is an 77 y.o. female.   Chief Complaint  Patient presents with  . 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  She is now approximately 5 weeks status post right breast lumpectomy.  She is 4 weeks status post open repair of right inguinal hernia.  She reports pain to the right groin that is improving but persist with movement.  She has no complaints of pain to the right breast or axillary incision.  Since her last visit she had repeat images which unfortunately showed the preoperatively placed clips are still within her right breast.  The patient denies any fevers, chills, nausea, vomiting, chest pain, shortness of breath, diarrhea, constipation.  Past Medical History:  Diagnosis Date  . Anxiety   . Arthritis   . Cancer [facility])    Right Breast Cancer  . COPD (chronic obstructive pulmonary disease) ([place])   . Depression   . Dyspnea    with exertion  . GERD (gastroesophageal reflux disease)   . Hyperlipidemia   . Hypertension     Past Surgical History:  Procedure Laterality Date  . ABDOMINAL HYSTERECTOMY  1990   Partial  . BREAST BIOPSY Right 10/04/2016   axilla lymph node and axillay tail mass biopsy. invasive mammary carcinoma  . BREAST LUMPECTOMY Right 03/21/2017  . BREAST LUMPECTOMY WITH NEEDLE LOCALIZATION Right 03/21/2017   Procedure: BREAST LUMPECTOMY WITH NEEDLE LOCALIZATION;  Surgeon: Clayburn Pert, MD;  Location: [facility] ORS;  Service: General;  Laterality: Right;  . DILATION AND CURETTAGE OF UTERUS    . INGUINAL HERNIA REPAIR Right 03/26/2017   Procedure: HERNIA REPAIR INGUINAL INCARCERATED;  Surgeon: Jules Husbands, MD;  Location: [facility] ORS;  Service: General;  Laterality: Right;  . PORTACATH PLACEMENT Left 10/24/2016   Procedure: INSERTION  PORT-A-CATH;  Surgeon: [name], MD;  Location: [facility] ORS;  Service: General;  Laterality: Left;  . SENTINEL NODE BIOPSY Right 03/21/2017   Procedure: SENTINEL NODE BIOPSY;  Surgeon: Clayburn Pert, MD;  Location: [facility] ORS;  Service: General;  Laterality: Right;    Family History  Problem Relation Age of Onset  . Colon cancer Mother   . Breast cancer Neg Hx     Social History:  reports that she quit smoking about 28 years ago. Her smoking use included cigarettes. She smoked 0.50 packs per day. she has never used smokeless tobacco. She reports that she does not drink alcohol or use drugs.  Allergies:  Allergies  Allergen Reactions  . Penicillins Swelling and Other (See Comments)    Has patient had a PCN reaction causing immediate rash, facial/tongue/throat swelling, SOB or lightheadedness with hypotension: Yes Has patient had a PCN reaction causing severe rash involving mucus membranes or skin necrosis: No Has patient had a PCN reaction that required hospitalization: No Has patient had a PCN reaction occurring within the last 10 years: No If all of the above answers are "NO", then may proceed with Cephalosporin use.   . Atorvastatin Other (See Comments)    Muscle Pain  . Gabapentin Swelling  . Ezetimibe Other (See Comments)    Medications reviewed.    ROS A multipoint review of systems was completed, all pertinent positives and negatives were document within the HPI and the remainder are negative   BP (!) 160/85   Pulse 78  Temp 97.8 F (36.6 C) (Oral)   Ht '5\' 2"'$  (1.575 m)   Wt 70.4 kg (155 lb 3.2 oz)   BMI 28.39 kg/m   Physical Exam General: No acute distress Neck: Supple nontender Breast: Right breast/axillary incision well approximated without any evidence of erythema or drainage.  There are no palpable masses or concerning findings on the breast. Chest: Clear to auscultation Heart: Rate and rhythm Abdomen: Soft nondistended, appropriately tender in the  right groin at the inguinal incision.  No evidence of recurrence on exam.    No results found for this or any previous visit (from the past 48 hour(s)). Nm Cardiac Muga Rest  Result Date: 04/30/2017 CLINICAL DATA:  Breast cancer. Evaluate cardiac function in relation to chemotherapy. EXAM: NUCLEAR MEDICINE CARDIAC BLOOD POOL IMAGING (MUGA) TECHNIQUE: Cardiac multi-gated acquisition was performed at rest following intravenous injection of Tc-27mlabeled red blood cells. RADIOPHARMACEUTICALS:  21.9 mCi Tc-958mDP in-vitro labeled red blood cells IV COMPARISON:  None. FINDINGS: No  focal wall motion abnormality of the left ventricle. Calculated left ventricular ejection fraction equals 67% IMPRESSION: Left ventricular ejection fraction equals 67 %. Electronically Signed   By: StSuzy Bouchard.D.   On: 04/30/2017 15:27    Assessment/Plan:  1. Malignant neoplasm of upper-outer quadrant of right breast in female, estrogen receptor negative ([id number] old female with a history of right-sided breast cancer.  Biopsy clips remain within the breast.  Discussed that they appear to be visible on ultrasound per radiology.  Also discussed that I would consult with 1 of my senior surgeons in the area to assess whether or not we could visualize this under ultrasound ourselves in the operating room.  Therefore patient will be [facility] at [facility] next Tuesday morning with myself and Dr. ByBary Castillao evaluate the area under ultrasound.  At that point if they are visible we will place her on the operative schedule to have the areas removed.     ChClayburn PertMD FACS General Surgeon  04/30/2017,4:52 PM

## 2017-05-01 ENCOUNTER — Encounter: Payer: Self-pay | Admitting: Oncology

## 2017-05-01 ENCOUNTER — Inpatient Hospital Stay: Payer: Medicare HMO

## 2017-05-01 ENCOUNTER — Inpatient Hospital Stay: Payer: Medicare HMO | Attending: Oncology

## 2017-05-01 ENCOUNTER — Inpatient Hospital Stay (HOSPITAL_BASED_OUTPATIENT_CLINIC_OR_DEPARTMENT_OTHER): Payer: Medicare HMO | Admitting: Oncology

## 2017-05-01 ENCOUNTER — Other Ambulatory Visit: Payer: Self-pay

## 2017-05-01 VITALS — BP 139/82 | HR 74 | Temp 97.7°F | Resp 20 | Wt 156.2 lb

## 2017-05-01 DIAGNOSIS — E876 Hypokalemia: Secondary | ICD-10-CM | POA: Diagnosis not present

## 2017-05-01 DIAGNOSIS — C50919 Malignant neoplasm of unspecified site of unspecified female breast: Secondary | ICD-10-CM

## 2017-05-01 DIAGNOSIS — Z171 Estrogen receptor negative status [ER-]: Secondary | ICD-10-CM | POA: Insufficient documentation

## 2017-05-01 DIAGNOSIS — C50411 Malignant neoplasm of upper-outer quadrant of right female breast: Secondary | ICD-10-CM

## 2017-05-01 DIAGNOSIS — Z5112 Encounter for antineoplastic immunotherapy: Secondary | ICD-10-CM | POA: Insufficient documentation

## 2017-05-01 DIAGNOSIS — K460 Unspecified abdominal hernia with obstruction, without gangrene: Secondary | ICD-10-CM

## 2017-05-01 LAB — COMPREHENSIVE METABOLIC PANEL
ALBUMIN: 3.2 g/dL — AB (ref 3.5–5.0)
ALT: 11 U/L — AB (ref 14–54)
AST: 24 U/L (ref 15–41)
Alkaline Phosphatase: 66 U/L (ref 38–126)
Anion gap: 11 (ref 5–15)
BUN: 20 mg/dL (ref 6–20)
CHLORIDE: 101 mmol/L (ref 101–111)
CO2: 25 mmol/L (ref 22–32)
CREATININE: 1.21 mg/dL — AB (ref 0.44–1.00)
Calcium: 8.9 mg/dL (ref 8.9–10.3)
GFR calc non Af Amer: 43 mL/min — ABNORMAL LOW (ref >60)
GFR, EST AFRICAN AMERICAN: 49 mL/min — AB (ref >60)
Glucose, Bld: 121 mg/dL — ABNORMAL HIGH (ref 65–99)
Potassium: 3.4 mmol/L — ABNORMAL LOW (ref 3.5–5.1)
SODIUM: 137 mmol/L (ref 135–145)
Total Bilirubin: 0.5 mg/dL (ref 0.3–1.2)
Total Protein: 6.9 g/dL (ref 6.5–8.1)

## 2017-05-01 LAB — CBC WITH DIFFERENTIAL/PLATELET
Basophils Absolute: 0.1 10*3/uL (ref 0–0.1)
Basophils Relative: 1 %
EOS ABS: 0.4 10*3/uL (ref 0–0.7)
Eosinophils Relative: 5 %
HEMATOCRIT: 36.3 % (ref 35.0–47.0)
Hemoglobin: 11.8 g/dL — ABNORMAL LOW (ref 12.0–16.0)
LYMPHS ABS: 3 10*3/uL (ref 1.0–3.6)
Lymphocytes Relative: 32 %
MCH: 29.9 pg (ref 26.0–34.0)
MCHC: 32.5 g/dL (ref 32.0–36.0)
MCV: 92.2 fL (ref 80.0–100.0)
MONOS PCT: 11 %
Monocytes Absolute: 1 10*3/uL — ABNORMAL HIGH (ref 0.2–0.9)
NEUTROS ABS: 4.7 10*3/uL (ref 1.4–6.5)
NEUTROS PCT: 51 %
Platelets: 260 10*3/uL (ref 150–440)
RBC: 3.94 MIL/uL (ref 3.80–5.20)
RDW: 15.1 % — ABNORMAL HIGH (ref 11.5–14.5)
WBC: 9.3 10*3/uL (ref 3.6–11.0)

## 2017-05-01 LAB — MAGNESIUM: Magnesium: 1.6 mg/dL — ABNORMAL LOW (ref 1.7–2.4)

## 2017-05-01 MED ORDER — SODIUM CHLORIDE 0.9 % IV SOLN
Freq: Once | INTRAVENOUS | Status: AC
  Administered 2017-05-01: 15:00:00 via INTRAVENOUS
  Filled 2017-05-01: qty 1000

## 2017-05-01 MED ORDER — ACETAMINOPHEN 325 MG PO TABS
650.0000 mg | ORAL_TABLET | Freq: Once | ORAL | Status: AC
  Administered 2017-05-01: 650 mg via ORAL
  Filled 2017-05-01: qty 2

## 2017-05-01 MED ORDER — HEPARIN SOD (PORK) LOCK FLUSH 100 UNIT/ML IV SOLN
500.0000 [IU] | Freq: Once | INTRAVENOUS | Status: DC | PRN
  Filled 2017-05-01: qty 5

## 2017-05-01 MED ORDER — TRASTUZUMAB CHEMO 150 MG IV SOLR
450.0000 mg | Freq: Once | INTRAVENOUS | Status: AC
  Administered 2017-05-01: 450 mg via INTRAVENOUS
  Filled 2017-05-01: qty 21.43

## 2017-05-01 MED ORDER — DIPHENHYDRAMINE HCL 25 MG PO CAPS
25.0000 mg | ORAL_CAPSULE | Freq: Once | ORAL | Status: AC
  Administered 2017-05-01: 25 mg via ORAL
  Filled 2017-05-01: qty 1

## 2017-05-01 NOTE — Progress Notes (Signed)
Patient denies any concerns today.  

## 2017-05-08 ENCOUNTER — Encounter: Payer: Self-pay | Admitting: General Surgery

## 2017-05-08 ENCOUNTER — Inpatient Hospital Stay (INDEPENDENT_AMBULATORY_CARE_PROVIDER_SITE_OTHER): Payer: Medicare HMO

## 2017-05-08 ENCOUNTER — Ambulatory Visit (INDEPENDENT_AMBULATORY_CARE_PROVIDER_SITE_OTHER): Payer: Medicare HMO | Admitting: General Surgery

## 2017-05-08 VITALS — BP 122/72 | HR 72 | Resp 14 | Ht 62.0 in | Wt 156.0 lb

## 2017-05-08 DIAGNOSIS — C50411 Malignant neoplasm of upper-outer quadrant of right female breast: Secondary | ICD-10-CM

## 2017-05-08 DIAGNOSIS — Z171 Estrogen receptor negative status [ER-]: Secondary | ICD-10-CM | POA: Diagnosis not present

## 2017-05-08 NOTE — Progress Notes (Signed)
Patient ID: Krista Cisneros, female   DOB: 11-Jan-1941, 77 y.o.   MRN: [id number]  Chief Complaint  Patient presents with  . Other    HPI Krista Cisneros is a 77 y.o. female here today for her follow up right lumpectomy done on 03/21/2017 by Dr. Adonis Huguenin. Daughter, Helene Kelp is present at visit. The patient had received neoadjuvant chemotherapy prior to assessment by general surgery.  She underwent wire localization of both the primary site which was 1.5 cm on preoperative ultrasound and as well as a immediately adjacent lymph node which was also positive for tumor prior to therapy.  At the time of surgical exploration the localizing wires were not found to be associated with the clips placed at the time of biopsy and there was no evidence of cancer or areas were cancer had been treated in the breast and multiple sentinel nodes did not show tumor effect (these were all negative).  Ultrasound is planned to help determine if the node site can be identified and if the original biopsy site can be appreciated.  HPI  Past Medical History:  Diagnosis Date  . Anxiety   . Arthritis   . Cancer [facility])    Right Breast Cancer  . COPD (chronic obstructive pulmonary disease) ([place])   . Depression   . Dyspnea    with exertion  . GERD (gastroesophageal reflux disease)   . Hyperlipidemia   . Hypertension     Past Surgical History:  Procedure Laterality Date  . ABDOMINAL HYSTERECTOMY  1990   Partial  . BREAST BIOPSY Right 10/04/2016   axilla lymph node and axillay tail mass biopsy. invasive mammary carcinoma  . BREAST LUMPECTOMY Right 03/21/2017  . BREAST LUMPECTOMY WITH NEEDLE LOCALIZATION Right 03/21/2017   Procedure: BREAST LUMPECTOMY WITH NEEDLE LOCALIZATION;  Surgeon: Clayburn Pert, MD;  Location: [facility] ORS;  Service: General;  Laterality: Right;  . DILATION AND CURETTAGE OF UTERUS    . INGUINAL HERNIA REPAIR Right 03/26/2017   Procedure: HERNIA REPAIR INGUINAL INCARCERATED;  Surgeon:  Jules Husbands, MD;  Location: [facility] ORS;  Service: General;  Laterality: Right;  . PORTACATH PLACEMENT Left 10/24/2016   Procedure: INSERTION PORT-A-CATH;  Surgeon: [name], MD;  Location: [facility] ORS;  Service: General;  Laterality: Left;  . SENTINEL NODE BIOPSY Right 03/21/2017   Procedure: SENTINEL NODE BIOPSY;  Surgeon: Clayburn Pert, MD;  Location: [facility] ORS;  Service: General;  Laterality: Right;    Family History  Problem Relation Age of Onset  . Colon cancer Mother   . Breast cancer Neg Hx     Social History Social History   Tobacco Use  . Smoking status: Former Smoker    Packs/day: 0.50    Types: Cigarettes    Last attempt to quit: 07/23/1988    Years since quitting: 28.8  . Smokeless tobacco: Never Used  Substance Use Topics  . Alcohol use: No  . Drug use: No    Allergies  Allergen Reactions  . Penicillins Swelling and Other (See Comments)    Has patient had a PCN reaction causing immediate rash, facial/tongue/throat swelling, SOB or lightheadedness with hypotension: Yes Has patient had a PCN reaction causing severe rash involving mucus membranes or skin necrosis: No Has patient had a PCN reaction that required hospitalization: No Has patient had a PCN reaction occurring within the last 10 years: No If all of the above answers are "NO", then may proceed with Cephalosporin use.   . Atorvastatin Other (See Comments)  Muscle Pain  . Gabapentin Swelling  . Ezetimibe Other (See Comments)    Current Outpatient Medications  Medication Sig Dispense Refill  . ADVAIR DISKUS 500-50 MCG/DOSE AEPB Inhale 1 puff into the lungs 2 (two) times daily.     Marland Kitchen ALPRAZolam (XANAX) 0.25 MG tablet Take 1 tablet (0.25 mg total) by mouth at bedtime as needed for anxiety. 30 tablet 0  . Aspirin-Caffeine (BAYER BACK & BODY PAIN EX ST PO) Take 2 tablets daily as needed by mouth (for back pain).    . baclofen (LIORESAL) 10 MG tablet Take 10 mg by mouth at bedtime.  9  . co-enzyme Q-10  30 MG capsule Take 30 mg by mouth daily.    . diphenoxylate-atropine (LOMOTIL) 2.5-0.025 MG tablet Take 1 tablet by mouth 4 (four) times daily as needed for diarrhea or loose stools. 30 tablet 1  . DULoxetine (CYMBALTA) 60 MG capsule Take 1 capsule by mouth 1 day or 1 dose.  11  . lidocaine-prilocaine (EMLA) cream Apply 1 application as needed topically (for port access).    Marland Kitchen losartan-hydrochlorothiazide (HYZAAR) 100-25 MG tablet Take 1 tablet by mouth daily.     . montelukast (SINGULAIR) 10 MG tablet Take 10 mg by mouth daily.     . Omega-3 Fatty Acids (FISH OIL PO) Take 1 capsule by mouth daily.     Marland Kitchen omeprazole (PRILOSEC) 20 MG capsule Take 20 mg by mouth daily.    Marland Kitchen oxyCODONE (OXY IR/ROXICODONE) 5 MG immediate release tablet Take 1-2 tablets (5-10 mg total) by mouth every 4 (four) hours as needed for moderate pain or severe pain. 30 tablet 0  . oxyCODONE (ROXICODONE) 5 MG immediate release tablet Take 1 tablet (5 mg total) by mouth every 4 (four) hours as needed for severe pain. 30 tablet 0  . potassium chloride SA (K-DUR,KLOR-CON) 20 MEQ tablet Take 2 tablets (40 mEq total) by mouth daily. This is in addition to ur regular dose of 40meq daily 6 tablet 0  . promethazine (PHENERGAN) 25 MG tablet TAKE 25 MG BY MOUTH EVERY 6-8 HOURS AS NEEDED FOR NAUSEA  2  . triamcinolone ointment (KENALOG) 0.5 % Apply 1 application topically 2 (two) times daily. Rash on bilateral upper extremities 30 g 0   No current facility-administered medications for this visit.    Facility-Administered Medications Ordered in Other Visits  Medication Dose Route Frequency Provider Last Rate Last Dose  . 0.9 %  sodium chloride infusion   Intravenous Once Lloyd Huger, MD      . 0.9 %  sodium chloride infusion   Intravenous Once Lloyd Huger, MD      . 0.9 %  sodium chloride infusion   Intravenous Once Lloyd Huger, MD      . 0.9 %  sodium chloride infusion   Intravenous Continuous Grayland Ormond, Kathlene November,  MD      . ondansetron (ZOFRAN) 8 mg in sodium chloride 0.9 % 50 mL IVPB   Intravenous Once Lloyd Huger, MD        Review of Systems Review of Systems  Constitutional: Negative.   Respiratory: Negative.   Cardiovascular: Negative.     Blood pressure 122/72, pulse 72, resp. rate 14, height 5\' 2"  (1.575 m), weight 156 lb (70.8 kg).  Physical Exam Physical Exam  Constitutional: She is oriented to person, place, and time. She appears well-developed and well-nourished.  Pulmonary/Chest:    Neurological: She is alert and oriented to person, place, and time.  Skin: Skin is warm and dry.    Data Reviewed The pre-and post wide excision mammograms and ultrasounds were reviewed.  Ultrasound examination of the axilla showed what appeared to be a 1 cm node with cortical thickening even with the sternal notch in the axilla.  No delineation of the original tumor.  Assessment    Difficult localization post neoadjuvant treatment.    Plan    The case was reviewed with Dr. Sanda Klein.  Whether treatment modalities would change if the treated note still had residual viable tumor is important and this will be discussed with the medical oncologist.  The possibility that lesions biopsied were indeed 2 lymph nodes and that this is a primary of unknown location in the breast must be considered based on biopsy results.        HPI, Physical Exam, Assessment and Plan have been scribed under the direction and in the presence of Hervey Ard, MD.  Gaspar Cola, CMA  I have completed the exam and reviewed the above documentation for accuracy and completeness.  I agree with the above.  Haematologist has been used and any errors in dictation or transcription are unintentional.  Hervey Ard, M.D., F.A.C.S.   Forest Gleason Yvette Loveless 05/09/2017, 8:29 PM

## 2017-05-15 ENCOUNTER — Telehealth: Payer: Self-pay | Admitting: General Surgery

## 2017-05-15 NOTE — Telephone Encounter (Signed)
Pt advised of pre op date/time and sx date. Sx: 05/21/17 with Dr Danise Mina of margin of right breast. Pre op: 05/16/17 between 1-5:00pm--phone interview.   Patient made aware to call [phone], between 1-3:00pm the day before surgery, to find out what time to arrive.

## 2017-05-15 NOTE — Addendum Note (Signed)
Addended by: Clayburn Pert T on: 05/15/2017 07:56 PM   Modules accepted: Orders, SmartSet

## 2017-05-16 ENCOUNTER — Telehealth: Payer: Self-pay | Admitting: *Deleted

## 2017-05-16 ENCOUNTER — Encounter
Admission: RE | Admit: 2017-05-16 | Discharge: 2017-05-16 | Disposition: A | Discharge status: Home / Self Care | Payer: Medicare HMO | Source: Ambulatory Visit | Attending: General Surgery | Admitting: General Surgery

## 2017-05-16 NOTE — Telephone Encounter (Signed)
Daughter called asking for patient if she will be ok to have her Herceptin infusion Tuesday, because she is having a lumpectomy on Monday. Patient feels she should put off the infusion Please advise

## 2017-05-16 NOTE — Telephone Encounter (Signed)
Per Dr. Grayland Ormond, move appointments out by 1 week.

## 2017-05-16 NOTE — Patient Instructions (Addendum)
Your procedure is scheduled on: 05-21-17  Report to Same Day Surgery 2nd floor medical mall [facility] Entrance-take elevator on left to 2nd floor.  Check in with surgery information desk.) To find out your arrival time please call [phone] between 1PM - 3PM on 05-18-17  Remember: Instructions that are not followed completely may result in serious medical risk, up to and including death, or upon the discretion of your surgeon and anesthesiologist your surgery may need to be rescheduled.    _x___ 1. Do not eat food after midnight the night before your procedure. NO GUM OR CANDY AFTER MIDNIGHT.  You may drink clear liquids up to 2 hours before you are scheduled to arrive at the hospital for your procedure.  Do not drink clear liquids within 2 hours of your scheduled arrival to the hospital.  Clear liquids include  --Water or Apple juice without pulp  --Clear carbohydrate beverage such as ClearFast or Gatorade  --Black Coffee or Clear Tea (No milk, no creamers, do not add anything to the coffee or Tea     __x__ 2. No Alcohol for 24 hours before or after surgery.   __x__3. No Smoking for 24 prior to surgery.   ____  4. Bring all medications with you on the day of surgery if instructed.    __x__ 5. Notify your doctor if there is any change in your medical condition     (cold, fever, infections).     Do not wear jewelry, make-up, hairpins, clips or nail polish.  Do not wear lotions, powders, or perfumes. You may wear deodorant.  Do not shave 48 hours prior to surgery. Men may shave face and neck.  Do not bring valuables to the hospital.    [facility] is not responsible for any belongings or valuables.               Contacts, dentures or bridgework may not be worn into surgery.  Leave your suitcase in the car. After surgery it may be brought to your room.  For patients admitted to the hospital, discharge time is determined by your treatment team.   Patients discharged the day of  surgery will not be allowed to drive home.  You will need someone to drive you home and stay with you the night of your procedure.   _x___ TAKE THE FOLLOWING MEDICATION THE MORNING OF SURGERY WITH A SMALL SIP OF WATER.  These include:  1. CYMBALTA  2. PRILOSEC  3. TAKE AN EXTRA PRILOSEC ON Sunday NIGHT BEFORE BED  4. MAY TAKE OXYCODONE IF NEEDED AM OF SURGERY  5.  6.  ____Fleets enema or Magnesium Citrate as directed.   _x___ Use CHG Soap or sage wipes as directed on instruction sheet   _X___ Use inhalers on the day of surgery and bring to hospital day of [place]  ____ Stop Metformin and Janumet 2 days prior to surgery.    ____ Take 1/2 of usual insulin dose the night before surgery and none on the morning surgery.   _X___ Follow recommendations from Cardiologist, Pulmonologist or PCP regarding  stopping Aspirin, Coumadin, Plavix ,Eliquis, Effient, or Pradaxa, and Pletal-STOP ASPIRIN NOW  X____Stop Anti-inflammatories such as Advil, Aleve, Ibuprofen, Motrin, Naproxen, Naprosyn, Goodies powders, BC POWDERS or aspirin products NOW-OK to take Tylenol OR OXYCODONE   _x___ Stop supplements until after surgery-STOP CO-Q 10, FISH OIL AND MELATONIN NOW-MAY RESUME AFTER SURGERY   ____ Bring C-Pap to the  hospital.

## 2017-05-17 NOTE — Telephone Encounter (Signed)
Pt has been r\s to 05/29/17 per inbasket message from Deirdre Peer due to procedure. BF

## 2017-05-17 NOTE — Telephone Encounter (Signed)
Daughter informed of appointment change and date and time. She thanked me for calling

## 2017-05-20 MED ORDER — CIPROFLOXACIN IN D5W 400 MG/200ML IV SOLN
400.0000 mg | INTRAVENOUS | Status: AC
  Administered 2017-05-21: 400 mg via INTRAVENOUS

## 2017-05-21 ENCOUNTER — Other Ambulatory Visit: Payer: Self-pay

## 2017-05-21 ENCOUNTER — Ambulatory Visit: Payer: Medicare HMO | Admitting: Anesthesiology

## 2017-05-21 ENCOUNTER — Ambulatory Visit
Admission: RE | Admit: 2017-05-21 | Discharge: 2017-05-21 | Disposition: A | Discharge status: Home / Self Care | Payer: Medicare HMO | Source: Ambulatory Visit | Attending: General Surgery | Admitting: General Surgery

## 2017-05-21 ENCOUNTER — Encounter: Payer: Self-pay | Admitting: *Deleted

## 2017-05-21 ENCOUNTER — Encounter
Admission: RE | Disposition: A | Discharge status: Home / Self Care | Payer: Self-pay | Source: Ambulatory Visit | Attending: General Surgery

## 2017-05-21 DIAGNOSIS — I739 Peripheral vascular disease, unspecified: Secondary | ICD-10-CM | POA: Insufficient documentation

## 2017-05-21 DIAGNOSIS — Z7982 Long term (current) use of aspirin: Secondary | ICD-10-CM | POA: Insufficient documentation

## 2017-05-21 DIAGNOSIS — Z87891 Personal history of nicotine dependence: Secondary | ICD-10-CM | POA: Insufficient documentation

## 2017-05-21 DIAGNOSIS — G4730 Sleep apnea, unspecified: Secondary | ICD-10-CM | POA: Diagnosis not present

## 2017-05-21 DIAGNOSIS — K219 Gastro-esophageal reflux disease without esophagitis: Secondary | ICD-10-CM | POA: Insufficient documentation

## 2017-05-21 DIAGNOSIS — F329 Major depressive disorder, single episode, unspecified: Secondary | ICD-10-CM | POA: Diagnosis not present

## 2017-05-21 DIAGNOSIS — J449 Chronic obstructive pulmonary disease, unspecified: Secondary | ICD-10-CM | POA: Diagnosis not present

## 2017-05-21 DIAGNOSIS — Z79899 Other long term (current) drug therapy: Secondary | ICD-10-CM | POA: Insufficient documentation

## 2017-05-21 DIAGNOSIS — C50911 Malignant neoplasm of unspecified site of right female breast: Secondary | ICD-10-CM | POA: Diagnosis not present

## 2017-05-21 DIAGNOSIS — I10 Essential (primary) hypertension: Secondary | ICD-10-CM | POA: Diagnosis not present

## 2017-05-21 DIAGNOSIS — Z853 Personal history of malignant neoplasm of breast: Secondary | ICD-10-CM | POA: Insufficient documentation

## 2017-05-21 DIAGNOSIS — E785 Hyperlipidemia, unspecified: Secondary | ICD-10-CM | POA: Insufficient documentation

## 2017-05-21 HISTORY — PX: BREAST LUMPECTOMY: SHX2

## 2017-05-21 HISTORY — PX: RE-EXCISION OF BREAST LUMPECTOMY: SHX6048

## 2017-05-21 IMAGING — MG MM BREAST SURGICAL SPECIMEN
4 series · 4 of 4 positions shown · non-contrast
Comparison: Previous exam(s).

CLINICAL DATA: Post right breast lumpectomy and axillary lymph node
dissection.

EXAM:
SPECIMEN RADIOGRAPH OF THE RIGHT BREAST

[R SPECIMEN (1 of 4)]
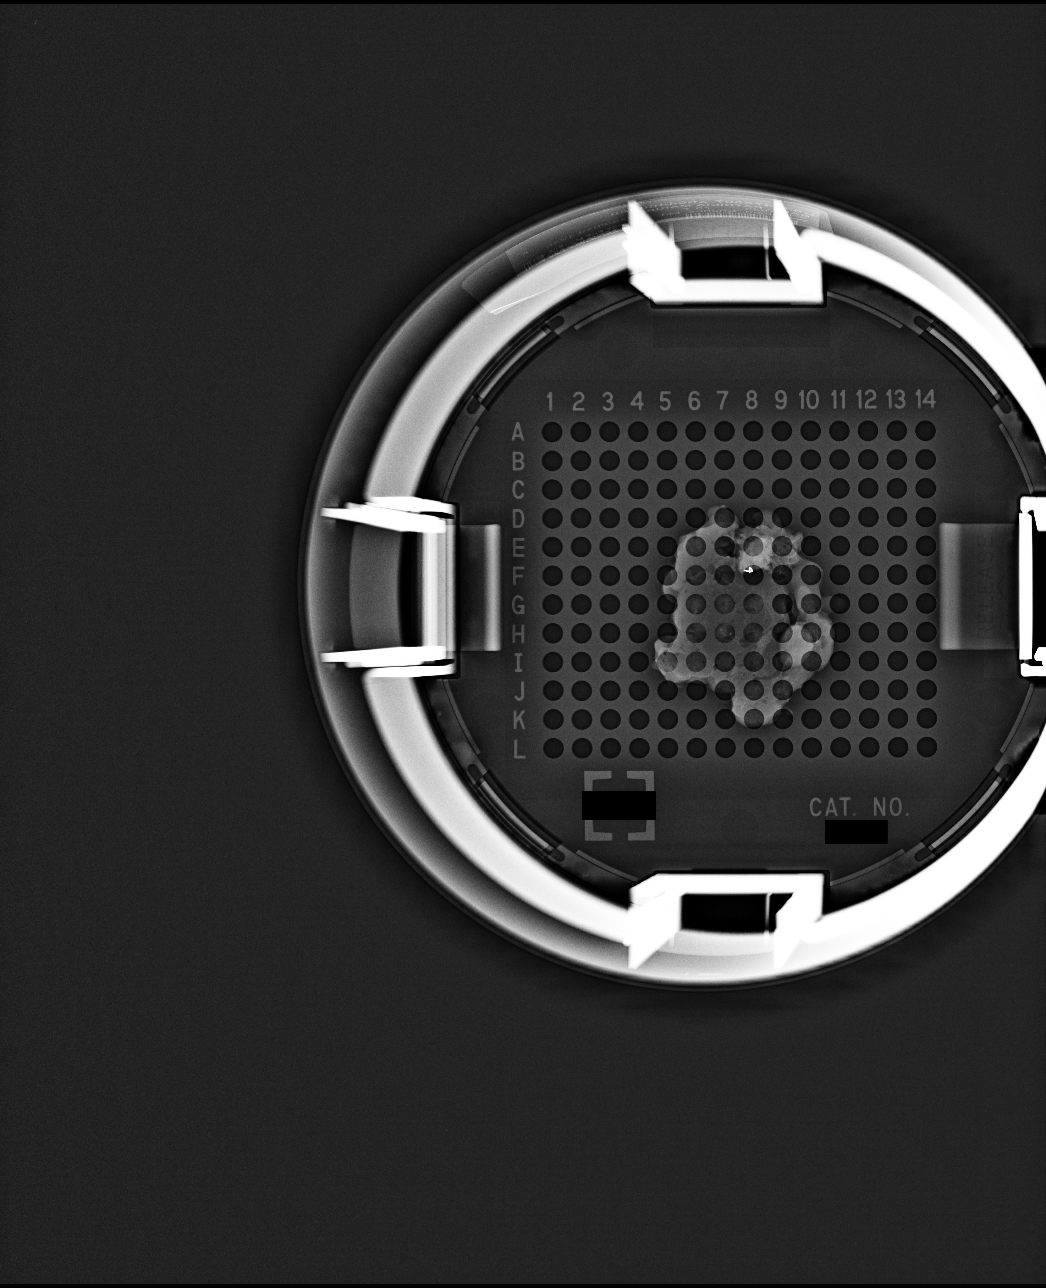

[R SPECIMEN (2 of 4)]
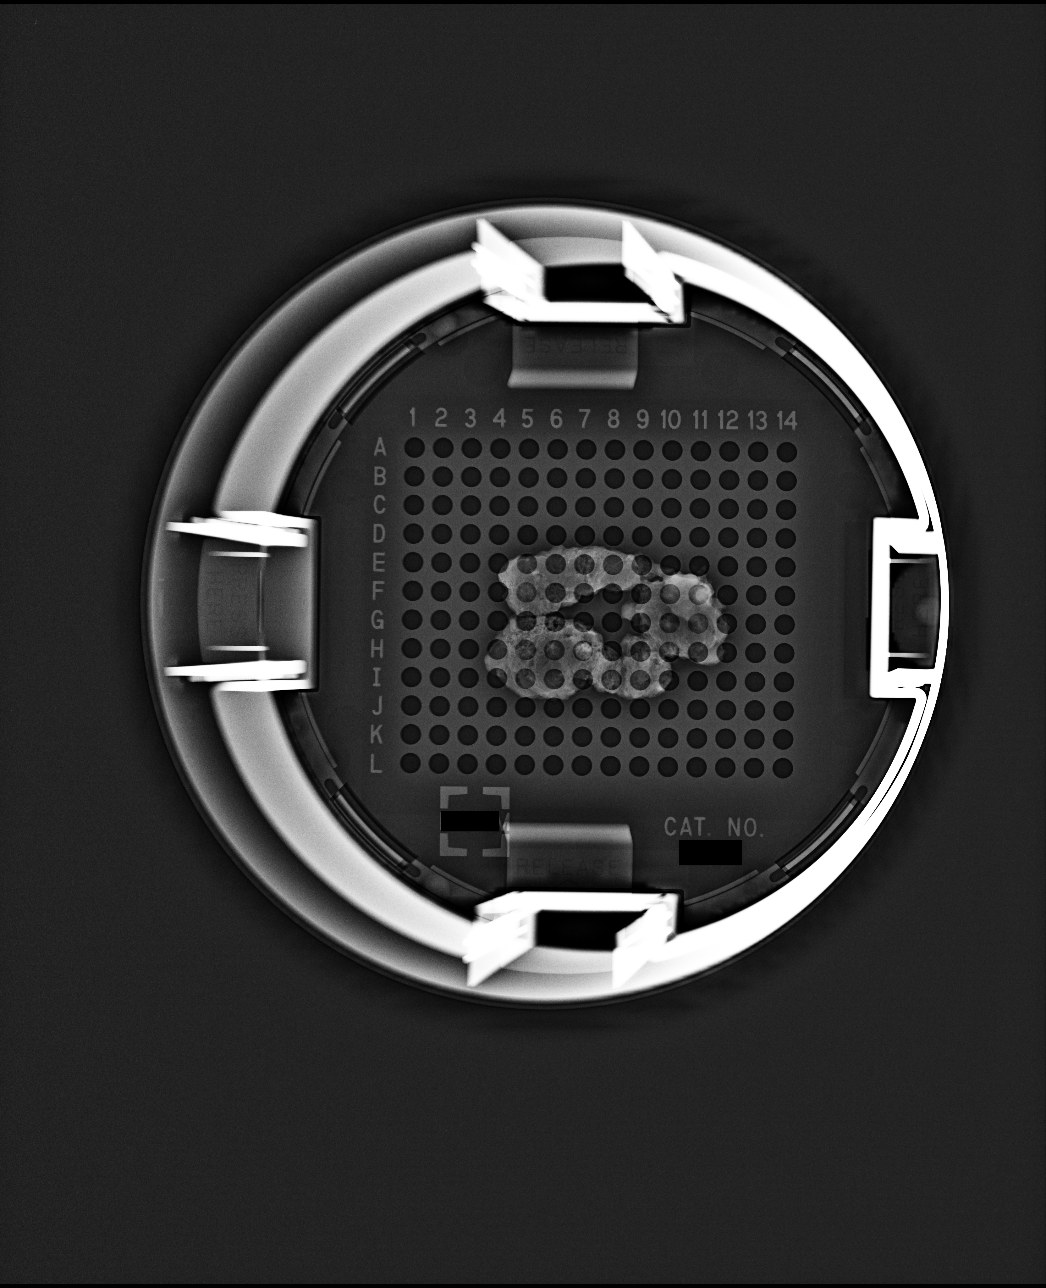

[R SPECIMEN (3 of 4)]
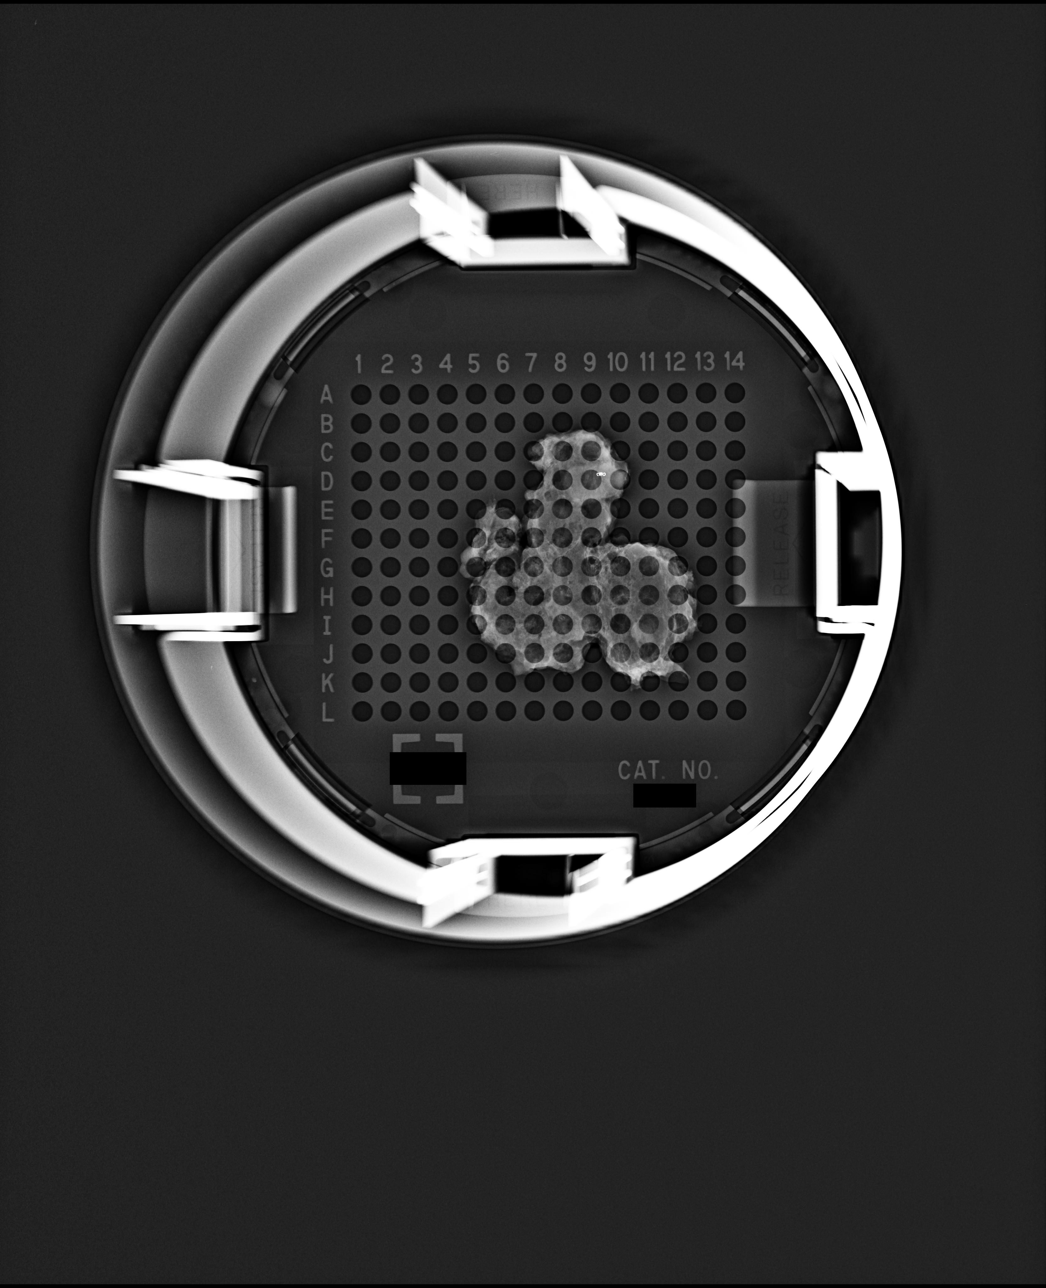

[R SPECIMEN (4 of 4)]
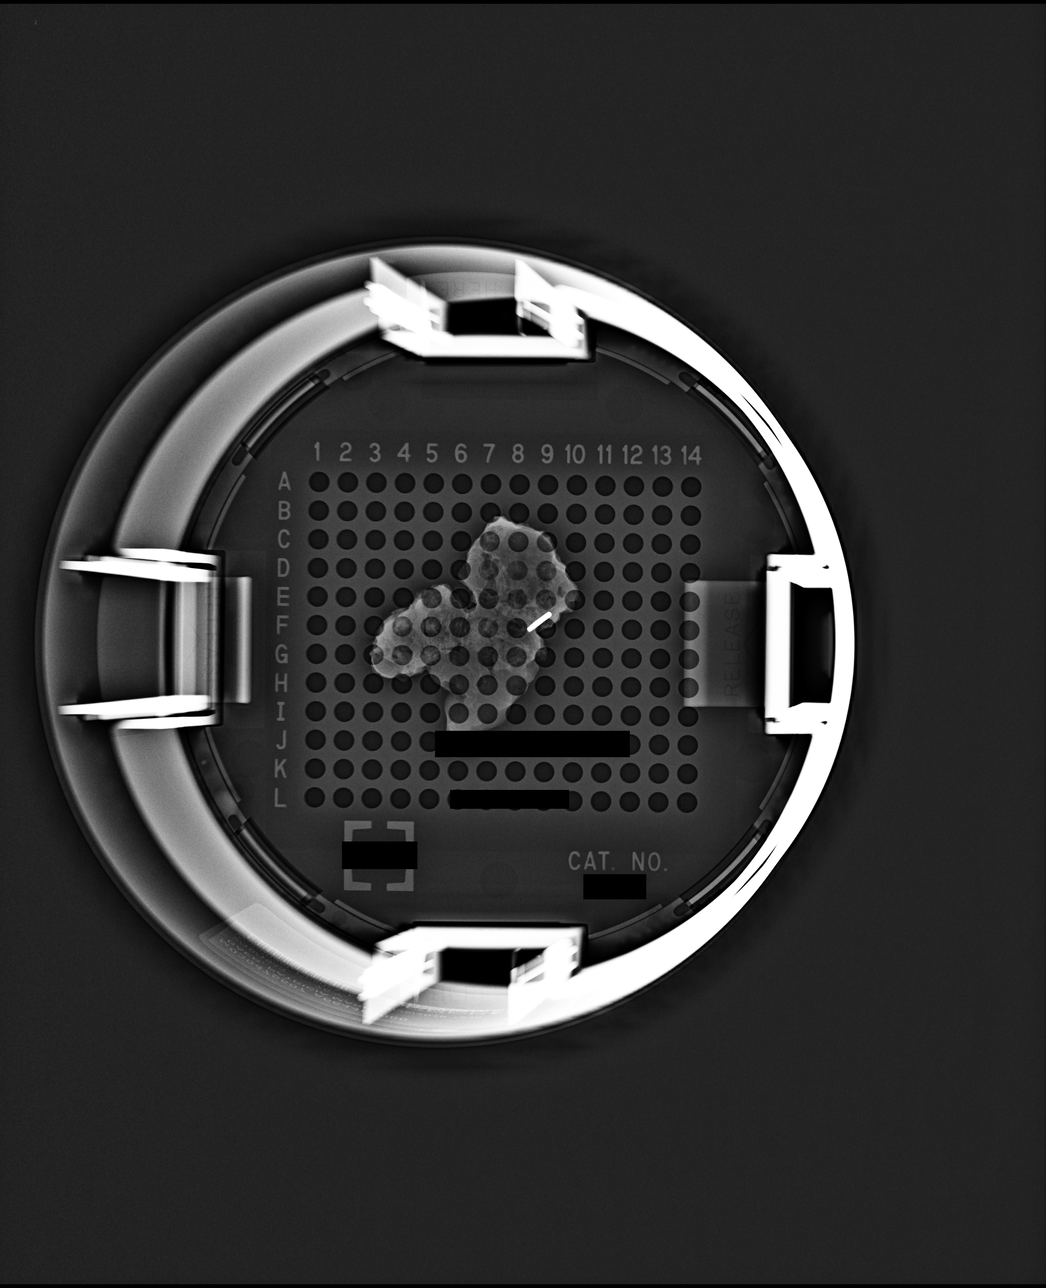

[4 of 4 positions shown; findings below may reference images not displayed]

FINDINGS: Status post excision of the right breast. Four specimens were sent
for examination. A ribbon shaped marker is seen within the first
specimen at F 8. A spiral HydroMARK is seen in the 4 specimen at D
9.
IMPRESSION: Specimen radiograph of the right breast.

## 2017-05-21 SURGERY — EXCISION, LESION, BREAST
Anesthesia: General | Laterality: Right | Wound class: Clean

## 2017-05-21 MED ORDER — MIDAZOLAM HCL 2 MG/2ML IJ SOLN
INTRAMUSCULAR | Status: AC
  Filled 2017-05-21: qty 2

## 2017-05-21 MED ORDER — DEXAMETHASONE SODIUM PHOSPHATE 10 MG/ML IJ SOLN
INTRAMUSCULAR | Status: AC
  Filled 2017-05-21: qty 1

## 2017-05-21 MED ORDER — PROPOFOL 10 MG/ML IV BOLUS
INTRAVENOUS | Status: DC | PRN
  Administered 2017-05-21: 100 mg via INTRAVENOUS

## 2017-05-21 MED ORDER — CHLORHEXIDINE GLUCONATE CLOTH 2 % EX PADS: 6.0000 | MEDICATED_PAD | Freq: Once | CUTANEOUS | Status: DC

## 2017-05-21 MED ORDER — BUPIVACAINE HCL (PF) 0.5 % IJ SOLN
INTRAMUSCULAR | Status: DC | PRN
  Administered 2017-05-21: 10 mL

## 2017-05-21 MED ORDER — ALBUTEROL SULFATE (2.5 MG/3ML) 0.083% IN NEBU
2.5000 mg | INHALATION_SOLUTION | Freq: Once | RESPIRATORY_TRACT | Status: AC
  Administered 2017-05-21: 2.5 mg via RESPIRATORY_TRACT

## 2017-05-21 MED ORDER — FENTANYL CITRATE (PF) 100 MCG/2ML IJ SOLN
INTRAMUSCULAR | Status: AC
  Administered 2017-05-21: 25 ug via INTRAVENOUS
  Filled 2017-05-21: qty 2

## 2017-05-21 MED ORDER — MIDAZOLAM HCL 2 MG/2ML IJ SOLN
INTRAMUSCULAR | Status: DC | PRN
  Administered 2017-05-21: 2 mg via INTRAVENOUS

## 2017-05-21 MED ORDER — ONDANSETRON HCL 4 MG/2ML IJ SOLN: 4.0000 mg | Freq: Once | INTRAMUSCULAR | Status: DC | PRN

## 2017-05-21 MED ORDER — LIDOCAINE-EPINEPHRINE (PF) 1 %-1:200000 IJ SOLN
INTRAMUSCULAR | Status: DC | PRN
  Administered 2017-05-21: 10 mL

## 2017-05-21 MED ORDER — FENTANYL CITRATE (PF) 100 MCG/2ML IJ SOLN
INTRAMUSCULAR | Status: AC
  Filled 2017-05-21: qty 2

## 2017-05-21 MED ORDER — LACTATED RINGERS IV SOLN
INTRAVENOUS | Status: DC
  Administered 2017-05-21: 11:00:00 via INTRAVENOUS

## 2017-05-21 MED ORDER — LIDOCAINE-EPINEPHRINE (PF) 1 %-1:200000 IJ SOLN
INTRAMUSCULAR | Status: AC
  Filled 2017-05-21: qty 30

## 2017-05-21 MED ORDER — CIPROFLOXACIN IN D5W 400 MG/200ML IV SOLN
INTRAVENOUS | Status: AC
  Filled 2017-05-21: qty 200

## 2017-05-21 MED ORDER — FENTANYL CITRATE (PF) 100 MCG/2ML IJ SOLN
INTRAMUSCULAR | Status: DC | PRN
  Administered 2017-05-21 (×4): 50 ug via INTRAVENOUS

## 2017-05-21 MED ORDER — OXYCODONE HCL 5 MG PO TABS: 5.0000 mg | ORAL_TABLET | ORAL | 0 refills | Status: DC | PRN

## 2017-05-21 MED ORDER — FENTANYL CITRATE (PF) 100 MCG/2ML IJ SOLN
25.0000 ug | INTRAMUSCULAR | Status: DC | PRN
  Administered 2017-05-21: 25 ug via INTRAVENOUS

## 2017-05-21 MED ORDER — ALBUTEROL SULFATE (2.5 MG/3ML) 0.083% IN NEBU
INHALATION_SOLUTION | RESPIRATORY_TRACT | Status: AC
  Filled 2017-05-21: qty 3

## 2017-05-21 MED ORDER — PROPOFOL 10 MG/ML IV BOLUS
INTRAVENOUS | Status: AC
  Filled 2017-05-21: qty 20

## 2017-05-21 MED ORDER — LIDOCAINE HCL (PF) 2 % IJ SOLN
INTRAMUSCULAR | Status: AC
  Filled 2017-05-21: qty 10

## 2017-05-21 MED ORDER — EPHEDRINE SULFATE 50 MG/ML IJ SOLN
INTRAMUSCULAR | Status: DC | PRN
  Administered 2017-05-21 (×4): 10 mg via INTRAVENOUS

## 2017-05-21 MED ORDER — ONDANSETRON HCL 4 MG/2ML IJ SOLN
INTRAMUSCULAR | Status: DC | PRN
  Administered 2017-05-21: 4 mg via INTRAVENOUS

## 2017-05-21 MED ORDER — PHENYLEPHRINE HCL 10 MG/ML IJ SOLN
INTRAMUSCULAR | Status: DC | PRN
  Administered 2017-05-21: 100 ug via INTRAVENOUS
  Administered 2017-05-21: 50 ug via INTRAVENOUS
  Administered 2017-05-21 (×3): 100 ug via INTRAVENOUS
  Administered 2017-05-21: 50 ug via INTRAVENOUS
  Administered 2017-05-21 (×2): 100 ug via INTRAVENOUS

## 2017-05-21 MED ORDER — ONDANSETRON HCL 4 MG/2ML IJ SOLN
INTRAMUSCULAR | Status: AC
  Filled 2017-05-21: qty 2

## 2017-05-21 MED ORDER — BUPIVACAINE HCL (PF) 0.5 % IJ SOLN
INTRAMUSCULAR | Status: AC
  Filled 2017-05-21: qty 30

## 2017-05-21 SURGICAL SUPPLY — 31 items
BLADE SURG 15 STRL LF DISP TIS (BLADE) ×1 IMPLANT
BLADE SURG 15 STRL SS (BLADE) ×2
CANISTER SUCT 1200ML W/VALVE (MISCELLANEOUS) ×3 IMPLANT
CHLORAPREP W/TINT 26ML (MISCELLANEOUS) ×3 IMPLANT
CNTNR SPEC 2.5X3XGRAD LEK (MISCELLANEOUS) ×1
CONT SPEC 4OZ STER OR WHT (MISCELLANEOUS) ×2
CONTAINER SPEC 2.5X3XGRAD LEK (MISCELLANEOUS) ×1 IMPLANT
DERMABOND ADVANCED (GAUZE/BANDAGES/DRESSINGS) ×2
DERMABOND ADVANCED .7 DNX12 (GAUZE/BANDAGES/DRESSINGS) ×1 IMPLANT
DRAPE LAPAROTOMY TRNSV 106X77 (MISCELLANEOUS) ×3 IMPLANT
ELECT CAUTERY BLADE 6.4 (BLADE) ×3 IMPLANT
ELECT REM PT RETURN 9FT ADLT (ELECTROSURGICAL) ×3
ELECTRODE REM PT RTRN 9FT ADLT (ELECTROSURGICAL) ×1 IMPLANT
GLOVE BIO SURGEON STRL SZ7.5 (GLOVE) ×3 IMPLANT
GLOVE INDICATOR 8.0 STRL GRN (GLOVE) ×3 IMPLANT
GOWN STRL REUS W/ TWL LRG LVL3 (GOWN DISPOSABLE) ×2 IMPLANT
GOWN STRL REUS W/TWL LRG LVL3 (GOWN DISPOSABLE) ×4
KIT RM TURNOVER STRD PROC AR (KITS) ×3 IMPLANT
LABEL OR SOLS (LABEL) ×3 IMPLANT
NEEDLE HYPO 25X1 1.5 SAFETY (NEEDLE) ×3 IMPLANT
PACK BASIN MINOR ARMC (MISCELLANEOUS) ×3 IMPLANT
SPONGE XRAY 4X4 16PLY STRL (MISCELLANEOUS) ×3 IMPLANT
SUT MNCRL 4-0 (SUTURE) ×2
SUT MNCRL 4-0 27XMFL (SUTURE) ×1
SUT SILK 2 0 SH (SUTURE) ×3 IMPLANT
SUT VIC AB 3-0 SH 27 (SUTURE) ×2
SUT VIC AB 3-0 SH 27X BRD (SUTURE) ×1 IMPLANT
SUTURE MNCRL 4-0 27XMF (SUTURE) ×1 IMPLANT
SYR 10ML LL (SYRINGE) ×3 IMPLANT
SYR BULB 3OZ (MISCELLANEOUS) ×3 IMPLANT
WATER STERILE IRR 1000ML POUR (IV SOLUTION) ×3 IMPLANT

## 2017-05-21 NOTE — Discharge Instructions (Addendum)
Breast Biopsy, Care After Refer to this sheet in the next few weeks. These instructions provide you with information about caring for yourself after your procedure. Your health care provider may also give you more specific instructions. Your treatment has been planned according to current medical practices, but problems sometimes occur. Call your health care provider if you have any problems or questions after your procedure. What can I expect after the procedure? After your procedure, it is common to have:  Bruising on your breast.  Numbness, tingling, or pain near your biopsy site.  Follow these instructions at home: Medicines  Take over-the-counter and prescription medicines only as told by your health care provider.  Do not drive for 24 hours if you received a sedative.  Do not drink alcohol while taking pain medicine.  Do not drive or operate heavy machinery while taking prescription pain medicine. Biopsy Site Care   Follow instructions from your health care provider about how to take care of your incision or puncture site. Make sure you: ? Wash your hands with soap and water before you change your dressing. If soap and water are not available, use hand sanitizer. ? Change any bandages (dressings) as told by your health care provider. ? Leave any stitches (sutures), skin glue, or adhesive strips in place. These skin closures may need to stay in place for 2 weeks or longer. If adhesive strip edges start to loosen and curl up, you may trim the loose edges. Do not remove adhesive strips completely unless your health care provider tells you to do that.  If you have sutures, keep them dry when bathing.  Check your incision or puncture area every day for signs of infection. Check for: ? More redness, swelling, or pain. ? More fluid or blood. ? Warmth. ? Pus or a bad smell.  Protect the biopsy area. Do not let the area get bumped. Activity  If you had an incision during your  procedure,avoid activities that may pull the incision site open. Avoid stretching, reaching, exercise, sports, or lifting anything that is heavier than 3 lb (1.4 kg).  Return to your normal activities as told by your health care provider. Ask your health care provider what activities are safe for you. General instructions  Resume your usual diet.  Wear a good support bra for as long as told by your health care provider.  Get checked for extra fluid around your lymph nodes (lymphedema) as often as told by your health care provider.  Keep all follow-up visits as told by your health care provider. This is important. Contact a health care provider if:  You have more redness, swelling, or pain at the biopsy site.  You have more fluid or blood coming from your biopsy site.  Your biopsy site feels warm to the touch.  You have pus or a bad smell coming from the biopsy site.  Your biopsy site breaks open after the sutures, staples, or skin adhesive strips have been removed.  You have a rash.  You have a fever. Get help right away if:  You have increased bleeding (more than a small spot) from the biopsy site.  You have difficulty breathing.  You have red streaks around the biopsy site. This information is not intended to replace advice given to you by your health care provider. Make sure you discuss any questions you have with your health care provider. Document Released: 10/28/2004 Document Revised: 12/16/2015 Document Reviewed: 01/12/2015 Elsevier Interactive Patient Education  2018 [employer].  AMBULATORY SURGERY  DISCHARGE INSTRUCTIONS   1) The drugs that you were given will stay in your system until tomorrow so for the next 24 hours you should not:  A) Drive an automobile B) Make any legal decisions C) Drink any alcoholic beverage   2) You may resume regular meals tomorrow.  Today it is better to start with liquids and gradually work up to solid foods.  You may  eat anything you prefer, but it is better to start with liquids, then soup and crackers, and gradually work up to solid foods.   3) Please notify your doctor immediately if you have any unusual bleeding, trouble breathing, redness and pain at the surgery site, drainage, fever, or pain not relieved by medication.    4) Additional Instructions:        Please contact your physician with any problems or Same Day Surgery at [phone], Monday through Friday 6 am to 4 pm, or [place] at [facility] number at [phone].

## 2017-05-21 NOTE — Anesthesia Postprocedure Evaluation (Signed)
Anesthesia Post Note  Patient: Krista Cisneros  Procedure(s) Performed: RE-EXCISION OF BREAST LUMPECTOMY (Right )  Patient location during evaluation: PACU Anesthesia Type: General Level of consciousness: awake and alert Pain management: pain level controlled Vital Signs Assessment: post-procedure vital signs reviewed and stable Respiratory status: spontaneous breathing, nonlabored ventilation, respiratory function stable and patient connected to nasal cannula oxygen Cardiovascular status: blood pressure returned to baseline and stable Postop Assessment: no apparent nausea or vomiting Anesthetic complications: no     Last Vitals:  Vitals:   05/21/17 1718 05/21/17 1719  BP: (!) 164/69 (!) 164/69  Pulse: 80 77  Resp: 12 17  Temp: (!) 36.1 C   SpO2: 94% 97%    Last Pain:  Vitals:   05/21/17 1727  TempSrc:   PainSc: 0-No pain                 Molli Barrows

## 2017-05-21 NOTE — Anesthesia Post-op Follow-up Note (Signed)
Anesthesia QCDR form completed.        

## 2017-05-21 NOTE — Transfer of Care (Signed)
Immediate Anesthesia Transfer of Care Note  Patient: Krista Cisneros  Procedure(s) Performed: RE-EXCISION OF BREAST LUMPECTOMY (Right )  Patient Location: PACU  Anesthesia Type:General  Level of Consciousness: awake  Airway & Oxygen Therapy: Patient connected to face mask oxygen  Post-op Assessment: Post -op Vital signs reviewed and stable  Post vital signs: stable  Last Vitals:  Vitals:   05/21/17 1052 05/21/17 1719  BP: (!) 173/86 (!) 164/69  Pulse: 86 77  Resp: 16 17  Temp: 36.8 C   SpO2: 99% 97%    Last Pain:  Vitals:   05/21/17 1052  TempSrc: Oral         Complications: No apparent anesthesia complications

## 2017-05-21 NOTE — Brief Op Note (Signed)
05/21/2017  5:10 PM  PATIENT:  Krista Cisneros  77 y.o. female  PRE-OPERATIVE DIAGNOSIS:  MALIGNANT NEOPLASM RIGHT BREAST  POST-OPERATIVE DIAGNOSIS:  MALIGNANT NEOPLASM RIGHT BREAST  PROCEDURE:  Procedure(s): RE-EXCISION OF BREAST LUMPECTOMY (Right)  SURGEON:  Surgeon(s) and Role:    Clayburn Pert, MD - Primary  PHYSICIAN ASSISTANT:   ASSISTANTS: none   ANESTHESIA:   general  EBL:  50 mL   BLOOD ADMINISTERED:none  DRAINS: none   LOCAL MEDICATIONS USED:  MARCAINE   , XYLOCAINE  and Amount: 20 ml  SPECIMEN:  Source of Specimen:  right axillary and breast tissue x 4  DISPOSITION OF SPECIMEN:  PATHOLOGY  COUNTS:  YES  TOURNIQUET:  * No tourniquets in log *  DICTATION: .Dragon Dictation  PLAN OF CARE: Discharge to home after PACU  PATIENT DISPOSITION:  PACU - hemodynamically stable.   Delay start of Pharmacological VTE agent (>24hrs) due to surgical blood loss or risk of bleeding: no

## 2017-05-21 NOTE — Anesthesia Preprocedure Evaluation (Signed)
Anesthesia Evaluation  Patient identified by MRN, date of birth, ID band Patient awake    Reviewed: Allergy & Precautions, NPO status , Patient's Chart, lab work & pertinent test results  History of Anesthesia Complications Negative for: history of anesthetic complications  Airway Mallampati: III       Dental  (+) Lower Dentures, Upper Dentures   Pulmonary shortness of breath and with exertion, neg sleep apnea, COPD,  COPD inhaler, former smoker,           Cardiovascular hypertension, Pt. on medications + Peripheral Vascular Disease  (-) Past MI and (-) CHF (-) dysrhythmias (-) Valvular Problems/Murmurs     Neuro/Psych neg Seizures PSYCHIATRIC DISORDERS Anxiety Depression negative neurological ROS     GI/Hepatic Neg liver ROS, GERD  Medicated and Controlled,  Endo/Other  negative endocrine ROSneg diabetes  Renal/GU negative Renal ROS     Musculoskeletal  (+) Arthritis , Osteoarthritis,    Abdominal   Peds  Hematology negative hematology ROS (+)   Anesthesia Other Findings   Reproductive/Obstetrics                             Anesthesia Physical  Anesthesia Plan  ASA: III  Anesthesia Plan: General   Post-op Pain Management:    Induction: Intravenous  PONV Risk Score and Plan: 3 and Dexamethasone, Ondansetron and Midazolam  Airway Management Planned: LMA  Additional Equipment:   Intra-op Plan:   Post-operative Plan:   Informed Consent: I have reviewed the patients History and Physical, chart, labs and discussed the procedure including the risks, benefits and alternatives for the proposed anesthesia with the patient or authorized representative who has indicated his/her understanding and acceptance.     Plan Discussed with:   Anesthesia Plan Comments:         Anesthesia Quick Evaluation

## 2017-05-21 NOTE — Interval H&P Note (Signed)
History and Physical Interval Note:  05/21/2017 12:15 PM  Krista Cisneros  has presented today for surgery, with the diagnosis of MALIGNANT NEOPLASM RIGHT BREAST  The various methods of treatment have been discussed with the patient and family. After consideration of risks, benefits and other options for treatment, the patient has consented to  Procedure(s): RE-EXCISION OF BREAST LUMPECTOMY (Right) as a surgical intervention .  The patient's history has been reviewed, patient examined, no change in status, stable for surgery.  I have reviewed the patient's chart and labs.  Questions were answered to the patient's satisfaction.     Clayburn Pert

## 2017-05-21 NOTE — Op Note (Signed)
   Pre-operative Diagnosis: History of right-sided breast cancer  Post-operative Diagnosis: Same  Procedure performed: Excision of additional right axillary and breast tissue for removal of biopsy clips.  Surgeon: Clayburn Pert   Assistants: None  Anesthesia: General endotracheal anesthesia  ASA Class: 2  Surgeon: Clayburn Pert, MD FACS  Anesthesia: Gen. with endotracheal tube  Assistant: None  Procedure Details  The patient was seen again in the Holding Room. The benefits, complications, treatment options, and expected outcomes were discussed with the patient. The risks of bleeding, infection, failure to resolve symptoms, nerve injury, any of which could require further surgery were reviewed with the patient.   The patient was taken to Operating Room, identified as Krista Cisneros and the procedure verified.  A Time Out was held and the above information confirmed.  Prior to the induction of general anesthesia, antibiotic prophylaxis was administered. VTE prophylaxis was in place. General endotracheal anesthesia was then administered and tolerated well. After the induction, the right chest and axilla was prepped with Chloraprep and draped in the sterile fashion. The patient was positioned in the supine position.  The previous excision was opened up after localization of a 50-50 mixture of 1% lidocaine 0.5% Marcaine plain.  Using a combination of blunt dissection and Bovie electrocautery was taken down to the previous excision cavity.  The prior placed clips were identified and the pectus muscle was mobilized from the lateral aspect until the muscle could be distracted medially.  At this point the area deep to the prior excision area was investigated and a palpable area of abnormality was identified isolated and excised with electrocautery.  All the tissue around it was excised as well.  It was passed off the field and sent to mammography and a previously placed biopsy clip is  identified in the middle of the specimen.  Given that there were 2 clips placed continued dissection was undertaken.  The additional clip was not palpable or visible.  It took 3 separate attempts at excising additional tissue and what was thought to be the area of the biopsy clip in order to identify the second clip.  It was however identified prior to closure.  The entire area was copiously irrigated with sterile water.  The skin was re-localized with the aforementioned local anesthetic.  The axillary fascia was reapproximated with interrupted 3-0 Vicryl.  The skin was closed with a running subarticular 4-0 Monocryl.  The skin was then sealed with Dermabond.  Findings: Previous biopsy clips x2   Estimated Blood Loss: 50 mL         Drains: None         Specimens: Right axillary and breast tissue x4          Complications: None                  Condition: Good   Clayburn Pert, MD, FACS

## 2017-05-21 NOTE — Anesthesia Procedure Notes (Signed)
Procedure Name: LMA Insertion Date/Time: 05/21/2017 2:19 PM Performed by: Philbert Riser, CRNA Pre-anesthesia Checklist: Patient identified, Emergency Drugs available, Suction available and Patient being monitored Patient Re-evaluated:Patient Re-evaluated prior to induction Oxygen Delivery Method: Circle system utilized Preoxygenation: Pre-oxygenation with 100% oxygen Induction Type: IV induction Ventilation: Mask ventilation without difficulty LMA: LMA inserted LMA Size: 4.0 Number of attempts: 1 Placement Confirmation: positive ETCO2 Tube secured with: Tape Dental Injury: Teeth and Oropharynx as per pre-operative assessment

## 2017-05-22 ENCOUNTER — Ambulatory Visit: Payer: Medicare HMO

## 2017-05-22 ENCOUNTER — Other Ambulatory Visit: Payer: Medicare HMO

## 2017-05-22 ENCOUNTER — Ambulatory Visit: Payer: Medicare HMO | Admitting: Oncology

## 2017-05-22 ENCOUNTER — Encounter: Payer: Self-pay | Admitting: General Surgery

## 2017-05-23 ENCOUNTER — Telehealth: Payer: Self-pay | Admitting: General Practice

## 2017-05-23 NOTE — Telephone Encounter (Signed)
Patients daughter is calling, said her mother is having some pain in her arm can barely put any pressure on it. Also having some soreness. Asking if we could call her in anything else, and also may need to schedule an appointment as well. Please call patient and advise.

## 2017-05-24 MED ORDER — OXYCODONE-ACETAMINOPHEN 10-325 MG PO TABS: 1.0000 | ORAL_TABLET | ORAL | 0 refills | Status: DC | PRN

## 2017-05-24 NOTE — Telephone Encounter (Signed)
Left message on home phone to return call regarding message her daughter left earlier. Unable to leave message on cell phone-mailbox is full.   Patient had Re-excision of breast lumpectomy on 05/21/17.

## 2017-05-24 NOTE — Telephone Encounter (Signed)
Patient's daughter called returning your call, she can be reached at [phone]

## 2017-05-24 NOTE — Telephone Encounter (Signed)
Spoke with patient;s daughter.  Any movement with arm is causing her much pain.  Constant ache. The only relief she gets is when she takes 2 Oxycodone at 1 time 4-6 hours.   She has tried taking Ibuprofen but it does not provide any relief. Denies drainage, fever. No redness. Spoke with Dr.Woodham - new prescription for Oxycodone 10 mg every 4 hours.  Patient to come to office today to pick up.   Post op appointment made 05/30/17 @ 1:14 pm with Dr.Cooper.

## 2017-05-25 LAB — SURGICAL PATHOLOGY

## 2017-05-26 ENCOUNTER — Other Ambulatory Visit: Payer: Self-pay | Admitting: Oncology

## 2017-05-26 NOTE — Progress Notes (Signed)
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  INTERVAL HISTORY: Patient returns to clinic today for further evaluation and continuation of Herceptin.  She continues to have residual tenderness from her breast surgery.  She also has increased weakness and fatigue, but admits this is improving. She is now fully recovered from her abdominal surgery.  She has no neurologic complaints. She denies any fevers. She has no chest pain, cough, or shortness of breath. She denies any nausea, vomiting, constipation, or diarrhea.  She has no abdominal pain.  She has no urinary complaints. Patient offers no further specific complaints today.  REVIEW OF SYSTEMS:   Review of Systems  Constitutional: Positive for malaise/fatigue. Negative for fever and weight loss.  Respiratory: Negative for cough and shortness of breath.   Cardiovascular: Negative.  Negative for chest pain and leg swelling.  Gastrointestinal: Negative.  Negative for abdominal pain, diarrhea and nausea.  Genitourinary: Negative.   Musculoskeletal: Negative.  Negative for joint pain.  Skin: Negative.  Negative for rash.  Neurological: Positive for weakness. Negative for sensory change.  Psychiatric/Behavioral: Negative.  The patient is not nervous/anxious.     As per HPI. Otherwise, a complete review of systems is negative.  PAST MEDICAL HISTORY: Past Medical History:  Diagnosis Date  . Anxiety   . Arthritis   . Cancer [facility])    Right Breast Cancer  . COPD (chronic obstructive pulmonary disease) ([place])   . Depression   . Dyspnea    with exertion  . GERD  (gastroesophageal reflux disease)   . Hyperlipidemia   . Hypertension     PAST SURGICAL HISTORY: Past Surgical History:  Procedure Laterality Date  . ABDOMINAL HYSTERECTOMY  1990   Partial  . BREAST BIOPSY Right 10/04/2016   axilla lymph node and axillay tail mass biopsy. invasive mammary carcinoma  . BREAST LUMPECTOMY Right 03/21/2017  . BREAST LUMPECTOMY WITH NEEDLE LOCALIZATION Right 03/21/2017   Procedure: BREAST LUMPECTOMY WITH NEEDLE LOCALIZATION;  Surgeon: Clayburn Pert, MD;  Location: [facility] ORS;  Service: General;  Laterality: Right;  . DILATION AND CURETTAGE OF UTERUS    . INGUINAL HERNIA REPAIR Right 03/26/2017   Procedure: HERNIA REPAIR INGUINAL INCARCERATED;  Surgeon: Jules Husbands, MD;  Location: [facility] ORS;  Service: General;  Laterality: Right;  . PORTACATH PLACEMENT Left 10/24/2016   Procedure: INSERTION PORT-A-CATH;  Surgeon: [name], MD;  Location: [facility] ORS;  Service: General;  Laterality: Left;  . RE-EXCISION OF BREAST LUMPECTOMY Right 05/21/2017   Procedure: RE-EXCISION OF BREAST LUMPECTOMY;  Surgeon: Clayburn Pert, MD;  Location: [facility] ORS;  Service: General;  Laterality: Right;  . SENTINEL NODE BIOPSY Right 03/21/2017   Procedure: SENTINEL NODE BIOPSY;  Surgeon: Clayburn Pert, MD;  Location: [facility] ORS;  Service: General;  Laterality: Right;    FAMILY HISTORY: Family History  Problem Relation Age of Onset  . Colon cancer Mother   . Breast cancer Neg Hx     ADVANCED DIRECTIVES (Y/N):  N  HEALTH MAINTENANCE: Social History   Tobacco Use  . Smoking status: Former Smoker    Packs/day: 0.50    Types: Cigarettes    Last attempt to quit: 07/23/1988  Years since quitting: 28.8  . Smokeless tobacco: Never Used  Substance Use Topics  . Alcohol use: No  . Drug use: No     Colonoscopy:  PAP:  Bone density:  Lipid panel:  Allergies  Allergen Reactions  . Penicillins Anaphylaxis, Swelling and Rash    Has patient had a PCN reaction causing  immediate rash, facial/tongue/throat swelling, SOB or lightheadedness with hypotension: Yes Has patient had a PCN reaction causing severe rash involving mucus membranes or skin necrosis: No Has patient had a PCN reaction that required hospitalization: No  Has patient had a PCN reaction occurring within the last 10 years: No If all of the above answers are "NO", then may proceed with Cephalosporin use.   . Atorvastatin Other (See Comments)    Muscle Pain  . Gabapentin Swelling  . Ezetimibe Other (See Comments)    Muscle pain    Current Outpatient Medications  Medication Sig Dispense Refill  . ADVAIR DISKUS 500-50 MCG/DOSE AEPB Inhale 1 puff into the lungs 2 (two) times daily.     Marland Kitchen aspirin EC 81 MG tablet Take 81 mg by mouth daily.    . Aspirin-Caffeine (BAYER BACK & BODY PAIN EX ST PO) Take 2 tablets by mouth 2 (two) times daily as needed (for back pain).     . baclofen (LIORESAL) 10 MG tablet Take 10 mg by mouth at bedtime.  9  . co-enzyme Q-10 30 MG capsule Take 30 mg by mouth daily.    . diphenoxylate-atropine (LOMOTIL) 2.5-0.025 MG tablet Take 1 tablet by mouth 4 (four) times daily as needed for diarrhea or loose stools. 30 tablet 1  . DULoxetine (CYMBALTA) 30 MG capsule Take 30 mg by mouth every morning. Take with 60 mg to total 90 mg once daily  11  . DULoxetine (CYMBALTA) 60 MG capsule Take 60 mg by mouth every morning. Take with 30 mg to total 90 mg once daily  11  . lidocaine-prilocaine (EMLA) cream Apply 1 application as needed topically (for port access).    Marland Kitchen losartan-hydrochlorothiazide (HYZAAR) 100-25 MG tablet Take 1 tablet by mouth daily.     . Melatonin 5 MG TABS Take 10 mg by mouth at bedtime as needed (sleep).    . montelukast (SINGULAIR) 10 MG tablet Take 10 mg by mouth at bedtime.     . Omega-3 Fatty Acids (FISH OIL) 1000 MG CAPS Take 1,000 mg by mouth daily.    Marland Kitchen omeprazole (PRILOSEC) 20 MG capsule Take 20 mg by mouth every morning.     Marland Kitchen oxyCODONE (ROXICODONE) 5 MG  immediate release tablet Take 1 tablet (5 mg total) by mouth every 4 (four) hours as needed for moderate pain or severe pain. 30 tablet 0  . oxyCODONE-acetaminophen (PERCOCET) 10-325 MG tablet Take 1 tablet by mouth every 4 (four) hours as needed for pain. 30 tablet 0  . potassium chloride SA (K-DUR,KLOR-CON) 20 MEQ tablet Take 2 tablets (40 mEq total) by mouth daily. This is in addition to ur regular dose of 17mq daily (Patient taking differently: Take 20 mEq by mouth daily. ) 6 tablet 0  . promethazine (PHENERGAN) 25 MG tablet TAKE 25 MG BY MOUTH EVERY 6-8 HOURS AS NEEDED FOR NAUSEA  2  . triamcinolone ointment (KENALOG) 0.5 % Apply 1 application topically 2 (two) times daily. Rash on bilateral upper extremities (Patient taking differently: Apply 1 application topically 2 (two) times daily as needed (rash). Rash on bilateral upper extremities) 30 g 0   No  current facility-administered medications for this visit.    Facility-Administered Medications Ordered in Other Visits  Medication Dose Route Frequency Provider Last Rate Last Dose  . 0.9 %  sodium chloride infusion   Intravenous Once Lloyd Huger, MD      . 0.9 %  sodium chloride infusion   Intravenous Once Lloyd Huger, MD      . 0.9 %  sodium chloride infusion   Intravenous Once Lloyd Huger, MD      . 0.9 %  sodium chloride infusion   Intravenous Continuous Grayland Ormond, Kathlene November, MD      . ondansetron (ZOFRAN) 8 mg in sodium chloride 0.9 % 50 mL IVPB   Intravenous Once Lloyd Huger, MD        OBJECTIVE: Vitals:   05/29/17 1004  BP: 117/68  Pulse: 69  Resp: 20  Temp: 97.7 F (36.5 C)     Body mass index is 26.83 kg/m.    ECOG FS:0 - Asymptomatic  General: Well-developed, well-nourished, no acute distress. Eyes: Pink conjunctiva, anicteric sclera. Breasts: Exam deferred today. Lungs: Clear to auscultation bilaterally. Heart: Regular rate and rhythm. No rubs, murmurs, or gallops. Abdomen: Soft,  nontender, nondistended. No organomegaly noted, normoactive bowel sounds. Musculoskeletal: Mild edema.  No cyanosis, or clubbing. Neuro: Alert, answering all questions appropriately. Cranial nerves grossly intact. Skin: No rashes or petechiae noted. Psych: Normal affect.   LAB RESULTS:  Lab Results  Component Value Date   NA 135 05/29/2017   K 3.5 05/29/2017   CL 99 (L) 05/29/2017   CO2 24 05/29/2017   GLUCOSE 158 (H) 05/29/2017   BUN 19 05/29/2017   CREATININE 1.26 (H) 05/29/2017   CALCIUM 9.1 05/29/2017   PROT 7.5 05/29/2017   ALBUMIN 3.0 (L) 05/29/2017   AST 26 05/29/2017   ALT 16 05/29/2017   ALKPHOS 66 05/29/2017   BILITOT 0.4 05/29/2017   GFRNONAA 41 (L) 05/29/2017   GFRAA 47 (L) 05/29/2017    Lab Results  Component Value Date   WBC 10.1 05/29/2017   NEUTROABS 5.3 05/29/2017   HGB 11.9 (L) 05/29/2017   HCT 36.8 05/29/2017   MCV 87.3 05/29/2017   PLT 456 (H) 05/29/2017     STUDIES: Mm Breast Surgical Specimen  Result Date: 05/21/2017 CLINICAL DATA:  Post right breast lumpectomy and axillary lymph node dissection. EXAM: SPECIMEN RADIOGRAPH OF THE RIGHT BREAST COMPARISON:  Previous exam(s). FINDINGS: Status post excision of the right breast. Four specimens were sent for examination. A ribbon shaped marker is seen within the first specimen at F 8. A spiral HydroMARK is seen in the 4 specimen at D 9. IMPRESSION: Specimen radiograph of the right breast. Electronically Signed   By: Fidela Salisbury M.D.   On: 05/21/2017 17:02   US Breast Complete Uni Right Inc Axilla  Result Date: 05/09/2017 Ultrasound examination of the axilla showed what appeared to be a 1 cm node with cortical thickening even with the sternal notch in the axilla.  No delineation of the original tumor.    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  PLAN:    1. Clinical stage IIB ER negative, PR and HER-2  positive invasive carcinoma of the right upper outer quadrant breast: Patient completed cycle 5 of 6 of neoadjuvant chemotherapy on January 30, 2017.  Treatment was discontinued secondary to poor performance status.  Patient underwent her lumpectomy on March 21, 2017 which appears  to be a complete pathologic response, but no biopsy clip was seen in her surgical specimen.  Her most recent surgery on May 21, 2017 to remove her retained clip, confirmed a complete pathological response.  MUGA scan from April 30, 2017 revealed an EF of 67% which is unchanged from previous. Given the PR positivity of her tumor she will benefit from an aromatase inhibitor for 5 years.  Proceed with cycle 9 of 18 of maintenance Herceptin today.  Return to clinic in 3 weeks for consideration of cycle 10.  Patient will also receive 1 L of IV fluids today. 2. Renal insufficiency: Mild, monitor. 3. Nausea: Patient does not complain of this today.  Continue current antiemetics as ordered. 4. Hypokalemia: Resolved.  Continue oral potassium supplementation as prescribed. 5. Hypomagnesemia: Resolved.  Continue oral supplementation.  Patient does not require IV magnesium today. 6.  Incarcerated hernia: Continue follow-up with surgery as scheduled.    Approximately 30 minutes was spent in discussion of which greater than 50% was consultation.  Patient expressed understanding and was in agreement with this plan. She also understands that She can call clinic at any time with any questions, concerns, or complaints.   Cancer Staging Malignant neoplasm of right female breast [facility]) Staging form: Breast, AJCC 8th Edition - Clinical stage from 10/16/2016: Stage IIB (cT2, cN1, cM0, G3, ER: Negative, PR: Positive, HER2: Positive) - Signed by Lloyd Huger, MD on 10/16/2016   Lloyd Huger, MD   06/01/2017 3:52 PM

## 2017-05-29 ENCOUNTER — Inpatient Hospital Stay: Payer: Medicare PPO | Attending: Oncology

## 2017-05-29 ENCOUNTER — Inpatient Hospital Stay: Payer: Medicare PPO

## 2017-05-29 ENCOUNTER — Inpatient Hospital Stay (HOSPITAL_BASED_OUTPATIENT_CLINIC_OR_DEPARTMENT_OTHER): Payer: Medicare PPO | Admitting: Oncology

## 2017-05-29 VITALS — BP 117/68 | HR 69 | Temp 97.7°F | Resp 20 | Wt 146.7 lb

## 2017-05-29 DIAGNOSIS — C50411 Malignant neoplasm of upper-outer quadrant of right female breast: Secondary | ICD-10-CM

## 2017-05-29 DIAGNOSIS — N289 Disorder of kidney and ureter, unspecified: Secondary | ICD-10-CM | POA: Insufficient documentation

## 2017-05-29 DIAGNOSIS — Z171 Estrogen receptor negative status [ER-]: Secondary | ICD-10-CM | POA: Insufficient documentation

## 2017-05-29 DIAGNOSIS — Z5112 Encounter for antineoplastic immunotherapy: Secondary | ICD-10-CM | POA: Diagnosis present

## 2017-05-29 DIAGNOSIS — C50011 Malignant neoplasm of nipple and areola, right female breast: Secondary | ICD-10-CM

## 2017-05-29 DIAGNOSIS — E876 Hypokalemia: Secondary | ICD-10-CM | POA: Diagnosis not present

## 2017-05-29 DIAGNOSIS — E860 Dehydration: Secondary | ICD-10-CM

## 2017-05-29 DIAGNOSIS — C50919 Malignant neoplasm of unspecified site of unspecified female breast: Secondary | ICD-10-CM

## 2017-05-29 LAB — CBC WITH DIFFERENTIAL/PLATELET
Basophils Absolute: 0.1 10*3/uL (ref 0–0.1)
Basophils Relative: 1 %
EOS ABS: 0.9 10*3/uL — AB (ref 0–0.7)
EOS PCT: 9 %
HCT: 36.8 % (ref 35.0–47.0)
Hemoglobin: 11.9 g/dL — ABNORMAL LOW (ref 12.0–16.0)
LYMPHS ABS: 2.9 10*3/uL (ref 1.0–3.6)
LYMPHS PCT: 29 %
MCH: 28.2 pg (ref 26.0–34.0)
MCHC: 32.4 g/dL (ref 32.0–36.0)
MCV: 87.3 fL (ref 80.0–100.0)
MONO ABS: 0.9 10*3/uL (ref 0.2–0.9)
MONOS PCT: 9 %
Neutro Abs: 5.3 10*3/uL (ref 1.4–6.5)
Neutrophils Relative %: 52 %
PLATELETS: 456 10*3/uL — AB (ref 150–440)
RBC: 4.22 MIL/uL (ref 3.80–5.20)
RDW: 14.8 % — ABNORMAL HIGH (ref 11.5–14.5)
WBC: 10.1 10*3/uL (ref 3.6–11.0)

## 2017-05-29 LAB — COMPREHENSIVE METABOLIC PANEL
ALBUMIN: 3 g/dL — AB (ref 3.5–5.0)
ALT: 16 U/L (ref 14–54)
AST: 26 U/L (ref 15–41)
Alkaline Phosphatase: 66 U/L (ref 38–126)
Anion gap: 12 (ref 5–15)
BUN: 19 mg/dL (ref 6–20)
CHLORIDE: 99 mmol/L — AB (ref 101–111)
CO2: 24 mmol/L (ref 22–32)
CREATININE: 1.26 mg/dL — AB (ref 0.44–1.00)
Calcium: 9.1 mg/dL (ref 8.9–10.3)
GFR calc Af Amer: 47 mL/min — ABNORMAL LOW (ref >60)
GFR, EST NON AFRICAN AMERICAN: 41 mL/min — AB (ref >60)
GLUCOSE: 158 mg/dL — AB (ref 65–99)
POTASSIUM: 3.5 mmol/L (ref 3.5–5.1)
Sodium: 135 mmol/L (ref 135–145)
Total Bilirubin: 0.4 mg/dL (ref 0.3–1.2)
Total Protein: 7.5 g/dL (ref 6.5–8.1)

## 2017-05-29 LAB — MAGNESIUM: MAGNESIUM: 1.7 mg/dL (ref 1.7–2.4)

## 2017-05-29 MED ORDER — DEXAMETHASONE SODIUM PHOSPHATE 10 MG/ML IJ SOLN
10.0000 mg | Freq: Once | INTRAMUSCULAR | Status: AC
  Administered 2017-05-29: 10 mg via INTRAVENOUS
  Filled 2017-05-29: qty 1

## 2017-05-29 MED ORDER — SODIUM CHLORIDE 0.9 % IV SOLN
Freq: Once | INTRAVENOUS | Status: AC
  Administered 2017-05-29: 11:00:00 via INTRAVENOUS
  Filled 2017-05-29: qty 1000

## 2017-05-29 MED ORDER — SODIUM CHLORIDE 0.9 % IV SOLN: 10.0000 mg | Freq: Once | INTRAVENOUS | Status: DC

## 2017-05-29 MED ORDER — OXYCODONE-ACETAMINOPHEN 10-325 MG PO TABS: 1.0000 | ORAL_TABLET | ORAL | 0 refills | Status: DC | PRN

## 2017-05-29 MED ORDER — DIPHENHYDRAMINE HCL 25 MG PO CAPS
25.0000 mg | ORAL_CAPSULE | Freq: Once | ORAL | Status: AC
  Administered 2017-05-29: 25 mg via ORAL
  Filled 2017-05-29: qty 1

## 2017-05-29 MED ORDER — TRASTUZUMAB CHEMO 150 MG IV SOLR
450.0000 mg | Freq: Once | INTRAVENOUS | Status: AC
  Administered 2017-05-29: 450 mg via INTRAVENOUS
  Filled 2017-05-29: qty 21.43

## 2017-05-29 MED ORDER — ACETAMINOPHEN 325 MG PO TABS
650.0000 mg | ORAL_TABLET | Freq: Once | ORAL | Status: AC
  Administered 2017-05-29: 650 mg via ORAL
  Filled 2017-05-29: qty 2

## 2017-05-29 MED ORDER — HEPARIN SOD (PORK) LOCK FLUSH 100 UNIT/ML IV SOLN
500.0000 [IU] | Freq: Once | INTRAVENOUS | Status: AC | PRN
  Administered 2017-05-29: 500 [IU]
  Filled 2017-05-29: qty 5

## 2017-05-29 NOTE — Progress Notes (Signed)
Pt has decreased weight. MD ok to keep current dose of 450mg 

## 2017-05-29 NOTE — Progress Notes (Signed)
Patient had surgery last week, reports continued pain and weakness.

## 2017-05-30 ENCOUNTER — Encounter: Payer: Medicare HMO | Admitting: Surgery

## 2017-05-30 ENCOUNTER — Telehealth: Payer: Self-pay

## 2017-05-30 ENCOUNTER — Ambulatory Visit (INDEPENDENT_AMBULATORY_CARE_PROVIDER_SITE_OTHER): Payer: Medicare PPO | Admitting: Surgery

## 2017-05-30 ENCOUNTER — Encounter: Payer: Self-pay | Admitting: Surgery

## 2017-05-30 VITALS — BP 121/75 | HR 65 | Temp 97.5°F | Ht 62.0 in | Wt 153.4 lb

## 2017-05-30 DIAGNOSIS — C50911 Malignant neoplasm of unspecified site of right female breast: Secondary | ICD-10-CM

## 2017-05-30 DIAGNOSIS — C50411 Malignant neoplasm of upper-outer quadrant of right female breast: Secondary | ICD-10-CM

## 2017-05-30 DIAGNOSIS — Z171 Estrogen receptor negative status [ER-]: Secondary | ICD-10-CM

## 2017-05-30 NOTE — Telephone Encounter (Signed)
Mammogram scheduled @ [facility] 09/13/17 @ 2:40 pm.   Dr.Cooper-10/03/17 @ 9:15 am  Patient notified of appointments.   Appointment reminder mailed today per patient request.

## 2017-05-30 NOTE — Progress Notes (Signed)
Outpatient postop visit  05/30/2017  Krista Cisneros is an 77 y.o. female.    Procedure: Needle localization breast biopsy  CC:No problems  HPI: This a patient status post needle localization breast biopsy for reexcision of markers and clips.  This was done on 28 January by Dr. Adonis Huguenin overall she feels better every day.  She was given a new prescription for pain medications yesterday by Dr. Grayland Ormond  Medications reviewed.    Physical Exam:  There were no vitals taken for this visit.    PE: Comfortable appearing female patient right upper outer quadrant wound is healing well with no erythema no drainage and no tenderness Dermabond is in place.    Assessment/Plan:  Pathology is reviewed and all benign.  Both clips were in the specimen.  Operative report is reviewed.  Patient doing very well recommend she follow-up with Dr. Adonis Huguenin in 2 weeks and with [facility] surgical in 6 months for a right mammogram to act as a new baseline mammogram.  Florene Glen, MD, FACS

## 2017-05-30 NOTE — Patient Instructions (Signed)
Please see your follow up appointment listed below.  I will get your 6 month Mammogram and follow up appointment to discuss your Mammogram results and call you later today with the appointment information.

## 2017-06-08 ENCOUNTER — Other Ambulatory Visit: Payer: Self-pay | Admitting: Oncology

## 2017-06-12 ENCOUNTER — Telehealth: Payer: Self-pay | Admitting: Oncology

## 2017-06-12 NOTE — Telephone Encounter (Signed)
Appt times adjusted earlier and patient will see NP/Lauren (ok per Erline Levine) [facility]/Lauren/ HERCEPTIN.  Appts schd and confirmed with patient.

## 2017-06-14 ENCOUNTER — Ambulatory Visit (INDEPENDENT_AMBULATORY_CARE_PROVIDER_SITE_OTHER): Payer: Medicare PPO | Admitting: General Surgery

## 2017-06-14 ENCOUNTER — Encounter: Payer: Self-pay | Admitting: General Surgery

## 2017-06-14 VITALS — BP 167/84 | HR 86 | Ht 62.0 in | Wt 151.0 lb

## 2017-06-14 DIAGNOSIS — Z4889 Encounter for other specified surgical aftercare: Secondary | ICD-10-CM

## 2017-06-14 NOTE — Progress Notes (Signed)
Outpatient Surgical Follow Up  06/14/2017  Krista Cisneros is an 77 y.o. female.   Chief Complaint  Patient presents with  . Routine Post Op    Re-Excision of Right Breast Lumpectomy 05/21/17 Dr.Dashae Wilcher    HPI: 77 year old female returns to clinic now 1 month status post reexcision of right-sided breast lesion for clip removal.  Patient reports she had significant pain afterwards but has since resolved.  She is no longer requiring pain medications.  She is eating well and having normal bowel function.  She had constipation from the pain medication.  She denies any fevers, chills, nausea, vomiting, chest pain, shortness of breath.  Past Medical History:  Diagnosis Date  . Anxiety   . Arthritis   . Cancer [facility])    Right Breast Cancer  . COPD (chronic obstructive pulmonary disease) ([place])   . Depression   . Dyspnea    with exertion  . GERD (gastroesophageal reflux disease)   . Hyperlipidemia   . Hypertension     Past Surgical History:  Procedure Laterality Date  . ABDOMINAL HYSTERECTOMY  1990   Partial  . BREAST BIOPSY Right 10/04/2016   axilla lymph node and axillay tail mass biopsy. invasive mammary carcinoma  . BREAST LUMPECTOMY Right 03/21/2017  . BREAST LUMPECTOMY WITH NEEDLE LOCALIZATION Right 03/21/2017   Procedure: BREAST LUMPECTOMY WITH NEEDLE LOCALIZATION;  Surgeon: Clayburn Pert, MD;  Location: [facility] ORS;  Service: General;  Laterality: Right;  . DILATION AND CURETTAGE OF UTERUS    . INGUINAL HERNIA REPAIR Right 03/26/2017   Procedure: HERNIA REPAIR INGUINAL INCARCERATED;  Surgeon: Jules Husbands, MD;  Location: [facility] ORS;  Service: General;  Laterality: Right;  . PORTACATH PLACEMENT Left 10/24/2016   Procedure: INSERTION PORT-A-CATH;  Surgeon: [name], MD;  Location: [facility] ORS;  Service: General;  Laterality: Left;  . RE-EXCISION OF BREAST LUMPECTOMY Right 05/21/2017   Procedure: RE-EXCISION OF BREAST LUMPECTOMY;  Surgeon: Clayburn Pert, MD;  Location: [facility]  ORS;  Service: General;  Laterality: Right;  . SENTINEL NODE BIOPSY Right 03/21/2017   Procedure: SENTINEL NODE BIOPSY;  Surgeon: Clayburn Pert, MD;  Location: [facility] ORS;  Service: General;  Laterality: Right;    Family History  Problem Relation Age of Onset  . Colon cancer Mother   . Breast cancer Neg Hx     Social History:  reports that she quit smoking about 28 years ago. Her smoking use included cigarettes. She smoked 0.50 packs per day. she has never used smokeless tobacco. She reports that she does not drink alcohol or use drugs.  Allergies:  Allergies  Allergen Reactions  . Penicillins Anaphylaxis, Swelling and Rash    Has patient had a PCN reaction causing immediate rash, facial/tongue/throat swelling, SOB or lightheadedness with hypotension: Yes Has patient had a PCN reaction causing severe rash involving mucus membranes or skin necrosis: No Has patient had a PCN reaction that required hospitalization: No  Has patient had a PCN reaction occurring within the last 10 years: No If all of the above answers are "NO", then may proceed with Cephalosporin use.   . Atorvastatin Other (See Comments)    Muscle Pain  . Gabapentin Swelling  . Ezetimibe Other (See Comments)    Muscle pain    Medications reviewed.    ROS A multipoint review of systems was completed, all pertinent positives and negatives were document within the HPI and the remainder are negative   BP (!) 167/84   Pulse 86   Ht 5\' 2"  (1.575  m)   Wt 68.5 kg (151 lb)   BMI 27.62 kg/m   Physical Exam General: No acute distress Neck: Supple nontender Breast: Right breast examined without any evidence of palpable lesion or areas of concern.  Well-healed right axillary incision without any evidence of erythema, drainage, infection, seroma Chest: Clear all station Heart: Regular rhythm Abdomen: Soft nontender with well-healed right inguinal hernia repair.    No results found for this or any previous visit  (from the past 48 hour(s)). No results found.  Assessment/Plan:  1. Aftercare following surgery 77 year old female doing well now 1 month status post reexcision of right breast area for clip removal.  Again reviewed pathology which showed no evidence of residual malignancy.  The patient will follow-up again in 1 month for 1 additional wound check.     Clayburn Pert, MD FACS General Surgeon  06/14/2017,9:36 AM

## 2017-06-14 NOTE — Patient Instructions (Signed)
Please see your follow up appointment listed below.  °

## 2017-06-19 ENCOUNTER — Inpatient Hospital Stay: Payer: Medicare PPO

## 2017-06-19 ENCOUNTER — Encounter: Payer: Self-pay | Admitting: Nurse Practitioner

## 2017-06-19 ENCOUNTER — Inpatient Hospital Stay (HOSPITAL_BASED_OUTPATIENT_CLINIC_OR_DEPARTMENT_OTHER): Payer: Medicare PPO | Admitting: Nurse Practitioner

## 2017-06-19 ENCOUNTER — Other Ambulatory Visit: Payer: Self-pay

## 2017-06-19 VITALS — BP 166/89 | HR 85 | Temp 97.0°F | Resp 18 | Wt 149.6 lb

## 2017-06-19 DIAGNOSIS — C50919 Malignant neoplasm of unspecified site of unspecified female breast: Secondary | ICD-10-CM

## 2017-06-19 DIAGNOSIS — Z171 Estrogen receptor negative status [ER-]: Secondary | ICD-10-CM

## 2017-06-19 DIAGNOSIS — E876 Hypokalemia: Secondary | ICD-10-CM | POA: Diagnosis not present

## 2017-06-19 DIAGNOSIS — Z5112 Encounter for antineoplastic immunotherapy: Secondary | ICD-10-CM | POA: Diagnosis not present

## 2017-06-19 DIAGNOSIS — C50411 Malignant neoplasm of upper-outer quadrant of right female breast: Secondary | ICD-10-CM | POA: Diagnosis not present

## 2017-06-19 LAB — COMPREHENSIVE METABOLIC PANEL
ALBUMIN: 3.1 g/dL — AB (ref 3.5–5.0)
ALT: 11 U/L — ABNORMAL LOW (ref 14–54)
ANION GAP: 7 (ref 5–15)
AST: 20 U/L (ref 15–41)
Alkaline Phosphatase: 64 U/L (ref 38–126)
BILIRUBIN TOTAL: 0.3 mg/dL (ref 0.3–1.2)
BUN: 20 mg/dL (ref 6–20)
CO2: 26 mmol/L (ref 22–32)
Calcium: 9 mg/dL (ref 8.9–10.3)
Chloride: 106 mmol/L (ref 101–111)
Creatinine, Ser: 1.11 mg/dL — ABNORMAL HIGH (ref 0.44–1.00)
GFR calc Af Amer: 54 mL/min — ABNORMAL LOW (ref >60)
GFR calc non Af Amer: 47 mL/min — ABNORMAL LOW (ref >60)
GLUCOSE: 131 mg/dL — AB (ref 65–99)
POTASSIUM: 3.3 mmol/L — AB (ref 3.5–5.1)
SODIUM: 139 mmol/L (ref 135–145)
Total Protein: 7.5 g/dL (ref 6.5–8.1)

## 2017-06-19 LAB — CBC WITH DIFFERENTIAL/PLATELET
Basophils Absolute: 0.1 10*3/uL (ref 0–0.1)
Basophils Relative: 1 %
EOS PCT: 7 %
Eosinophils Absolute: 0.8 10*3/uL — ABNORMAL HIGH (ref 0–0.7)
HEMATOCRIT: 36.5 % (ref 35.0–47.0)
Hemoglobin: 11.8 g/dL — ABNORMAL LOW (ref 12.0–16.0)
LYMPHS PCT: 22 %
Lymphs Abs: 2.5 10*3/uL (ref 1.0–3.6)
MCH: 27.5 pg (ref 26.0–34.0)
MCHC: 32.3 g/dL (ref 32.0–36.0)
MCV: 85.2 fL (ref 80.0–100.0)
MONO ABS: 1.2 10*3/uL — AB (ref 0.2–0.9)
MONOS PCT: 11 %
NEUTROS ABS: 6.5 10*3/uL (ref 1.4–6.5)
Neutrophils Relative %: 59 %
PLATELETS: 278 10*3/uL (ref 150–440)
RBC: 4.28 MIL/uL (ref 3.80–5.20)
RDW: 16.1 % — AB (ref 11.5–14.5)
WBC: 11.1 10*3/uL — ABNORMAL HIGH (ref 3.6–11.0)

## 2017-06-19 LAB — MAGNESIUM: Magnesium: 1.7 mg/dL (ref 1.7–2.4)

## 2017-06-19 MED ORDER — SODIUM CHLORIDE 0.9% FLUSH
10.0000 mL | Freq: Once | INTRAVENOUS | Status: AC
  Administered 2017-06-19: 10 mL via INTRAVENOUS
  Filled 2017-06-19: qty 10

## 2017-06-19 MED ORDER — TRASTUZUMAB CHEMO 150 MG IV SOLR
450.0000 mg | Freq: Once | INTRAVENOUS | Status: AC
  Administered 2017-06-19: 450 mg via INTRAVENOUS
  Filled 2017-06-19: qty 21.43

## 2017-06-19 MED ORDER — ACETAMINOPHEN 325 MG PO TABS
650.0000 mg | ORAL_TABLET | Freq: Once | ORAL | Status: AC
  Administered 2017-06-19: 650 mg via ORAL

## 2017-06-19 MED ORDER — HEPARIN SOD (PORK) LOCK FLUSH 100 UNIT/ML IV SOLN
500.0000 [IU] | Freq: Once | INTRAVENOUS | Status: AC
  Administered 2017-06-19: 500 [IU] via INTRAVENOUS

## 2017-06-19 MED ORDER — SODIUM CHLORIDE 0.9 % IV SOLN
Freq: Once | INTRAVENOUS | Status: AC
  Administered 2017-06-19: 11:00:00 via INTRAVENOUS
  Filled 2017-06-19: qty 1000

## 2017-06-19 MED ORDER — LETROZOLE 2.5 MG PO TABS: 2.5000 mg | ORAL_TABLET | Freq: Every day | ORAL | 3 refills | Status: DC

## 2017-06-19 MED ORDER — DIPHENHYDRAMINE HCL 25 MG PO CAPS
25.0000 mg | ORAL_CAPSULE | Freq: Once | ORAL | Status: AC
  Administered 2017-06-19: 25 mg via ORAL

## 2017-06-19 MED ORDER — SODIUM CHLORIDE 0.9 % IV SOLN
Freq: Once | INTRAVENOUS | Status: AC
  Administered 2017-06-19: 10:00:00 via INTRAVENOUS
  Filled 2017-06-19: qty 1000

## 2017-06-19 MED ORDER — HEPARIN SOD (PORK) LOCK FLUSH 100 UNIT/ML IV SOLN: 500.0000 [IU] | Freq: Once | INTRAVENOUS | Status: DC | PRN

## 2017-06-19 NOTE — Patient Instructions (Addendum)
Krista Cisneros,  It was a pleasure to meet you today. I have included some information about the Letrozole tablets which we discussed. I will also schedule a DEXA/Bone Density scan. You can also start Calcium 1200mg  and Vitamin D 800 iu daily for bone health. Thank you for allowing me to participate in your care today. Please let me know if I can help you in the future. -Sohil Timko   Letrozole tablets What is this medicine? LETROZOLE (LET roe zole) blocks the production of estrogen. It is used to treat breast cancer. This medicine may be used for other purposes; ask your health care provider or pharmacist if you have questions. COMMON BRAND NAME(S): Femara What should I tell my health care provider before I take this medicine? They need to know if you have any of these conditions: -high cholesterol -liver disease -osteoporosis (weak bones) -an unusual or allergic reaction to letrozole, other medicines, foods, dyes, or preservatives -pregnant or trying to get pregnant -breast-feeding How should I use this medicine? Take this medicine by mouth with a glass of water. You may take it with or without food. Follow the directions on the prescription label. Take your medicine at regular intervals. Do not take your medicine more often than directed. Do not stop taking except on your doctor's advice. Talk to your pediatrician regarding the use of this medicine in children. Special care may be needed. Overdosage: If you think you have taken too much of this medicine contact a poison control center or emergency room at once. NOTE: This medicine is only for you. Do not share this medicine with others. What if I miss a dose? If you miss a dose, take it as soon as you can. If it is almost time for your next dose, take only that dose. Do not take double or extra doses. What may interact with this medicine? Do not take this medicine with any of the following medications: -estrogens, like hormone replacement therapy  or birth control pills This medicine may also interact with the following medications: -dietary supplements such as androstenedione or DHEA -prasterone -tamoxifen This list may not describe all possible interactions. Give your health care provider a list of all the medicines, herbs, non-prescription drugs, or dietary supplements you use. Also tell them if you smoke, drink alcohol, or use illegal drugs. Some items may interact with your medicine. What should I watch for while using this medicine? Tell your doctor or healthcare professional if your symptoms do not start to get better or if they get worse. Do not become pregnant while taking this medicine or for 3 weeks after stopping it. Women should inform their doctor if they wish to become pregnant or think they might be pregnant. There is a potential for serious side effects to an unborn child. Talk to your health care professional or pharmacist for more information. Do not breast-feed while taking this medicine or for 3 weeks after stopping it. This medicine may interfere with the ability to have a child. Talk with your doctor or health care professional if you are concerned about your fertility. Using this medicine for a long time may increase your risk of low bone mass. Talk to your doctor about bone health. You may get drowsy or dizzy. Do not drive, use machinery, or do anything that needs mental alertness until you know how this medicine affects you. Do not stand or sit up quickly, especially if you are an older patient. This reduces the risk of dizzy or fainting spells.  You may need blood work done while you are taking this medicine. What side effects may I notice from receiving this medicine? Side effects that you should report to your doctor or health care professional as soon as possible: -allergic reactions like skin rash, itching, or hives -bone fracture -chest pain -signs and symptoms of a blood clot such as breathing problems;  changes in vision; chest pain; severe, sudden headache; pain, swelling, warmth in the leg; trouble speaking; sudden numbness or weakness of the face, arm or leg -vaginal bleeding Side effects that usually do not require medical attention (report to your doctor or health care professional if they continue or are bothersome): -bone, back, joint, or muscle pain -dizziness -fatigue -fluid retention -headache -hot flashes, night sweats -nausea -weight gain This list may not describe all possible side effects. Call your doctor for medical advice about side effects. You may report side effects to FDA at 1-800-FDA-1088. Where should I keep my medicine? Keep out of the reach of children. Store between 15 and 30 degrees C (59 and 86 degrees F). Throw away any unused medicine after the expiration date. NOTE: This sheet is a summary. It may not cover all possible information. If you have questions about this medicine, talk to your doctor, pharmacist, or health care provider.  2018 Elsevier/Gold Standard (2015-11-15 11:10:41)

## 2017-06-19 NOTE — Progress Notes (Signed)
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  INTERVAL HISTORY: Patient returns to clinic today for further evaluation and continuation of Herceptin.  She is approximately 1 month post re-excision of right sided breast lesion for clip removal (Dr. Adonis Huguenin 05/21/17). She had previously reported pain at the surgical site which has now resolved and she denies use of pain medication. Reports bowel function has returned to normal. Fatigue continues to improve and she has recovered from abdominal surgery. She denies neurologic complaints. Denies fevers, chest pain, cough, and/or shortness of breath. She denies nausea, vomiting, constipation and/or diarrhea. Denies abdominal pain. Denies urinary symptoms. Patient denies other specific complaints.   REVIEW OF SYSTEMS:   Review of Systems  Constitutional: Positive for malaise/fatigue. Negative for fever and weight loss.  HENT: Positive for congestion and sore throat.   Respiratory: Positive for cough. Negative for shortness of breath.   Cardiovascular: Negative.  Negative for chest pain and leg swelling.  Gastrointestinal: Negative.  Negative for abdominal pain, diarrhea and nausea.  Genitourinary: Negative.   Musculoskeletal: Negative.  Negative for joint pain.  Skin: Negative.  Negative for rash.  Neurological: Positive for weakness. Negative for sensory change.  Psychiatric/Behavioral: Negative.  The patient is not nervous/anxious.     As per HPI. Otherwise, a complete review of systems is negative.  PAST MEDICAL HISTORY: Past Medical History:  Diagnosis Date  . Anxiety     . Arthritis   . Cancer [facility])    Right Breast Cancer  . COPD (chronic obstructive pulmonary disease) ([place])   . Depression   . Dyspnea    with exertion  . GERD (gastroesophageal reflux disease)   . Hyperlipidemia   . Hypertension     PAST SURGICAL HISTORY: Past Surgical History:  Procedure Laterality Date  . ABDOMINAL HYSTERECTOMY  1990   Partial  . BREAST BIOPSY Right 10/04/2016   axilla lymph node and axillay tail mass biopsy. invasive mammary carcinoma  . BREAST LUMPECTOMY Right 03/21/2017  . BREAST LUMPECTOMY WITH NEEDLE LOCALIZATION Right 03/21/2017   Procedure: BREAST LUMPECTOMY WITH NEEDLE LOCALIZATION;  Surgeon: Clayburn Pert, MD;  Location: [facility] ORS;  Service: General;  Laterality: Right;  . DILATION AND CURETTAGE OF UTERUS    . INGUINAL HERNIA REPAIR Right 03/26/2017   Procedure: HERNIA REPAIR INGUINAL INCARCERATED;  Surgeon: Jules Husbands, MD;  Location: [facility] ORS;  Service: General;  Laterality: Right;  . PORTACATH PLACEMENT Left 10/24/2016   Procedure: INSERTION PORT-A-CATH;  Surgeon: [name], MD;  Location: [facility] ORS;  Service: General;  Laterality: Left;  . RE-EXCISION OF BREAST LUMPECTOMY Right 05/21/2017   Procedure: RE-EXCISION OF BREAST LUMPECTOMY;  Surgeon: Clayburn Pert, MD;  Location: [facility] ORS;  Service: General;  Laterality: Right;  . SENTINEL NODE BIOPSY Right 03/21/2017   Procedure: SENTINEL NODE BIOPSY;  Surgeon: Clayburn Pert, MD;  Location: [facility] ORS;  Service: General;  Laterality: Right;    FAMILY HISTORY: Family History  Problem Relation Age of Onset  . Colon cancer Mother   . Breast cancer Neg Hx     ADVANCED DIRECTIVES (Y/N):  N  HEALTH MAINTENANCE: Social History   Tobacco Use  . Smoking status: Former Smoker  Packs/day: 0.50    Types: Cigarettes    Last attempt to quit: 07/23/1988    Years since quitting: 28.9  . Smokeless tobacco: Never Used  Substance Use Topics  . Alcohol use: No  . Drug use: No     Colonoscopy:    Bone density: 04/29/2014 ([facility])  Lipid panel: 10/07/2016 ([facility])  Allergies  Allergen Reactions  . Penicillins Anaphylaxis, Swelling and Rash    Has patient had a PCN reaction causing immediate rash, facial/tongue/throat swelling, SOB or lightheadedness with hypotension: Yes Has patient had a PCN reaction causing severe rash involving mucus membranes or skin necrosis: No Has patient had a PCN reaction that required hospitalization: No  Has patient had a PCN reaction occurring within the last 10 years: No If all of the above answers are "NO", then may proceed with Cephalosporin use.   . Atorvastatin Other (See Comments)    Muscle Pain  . Gabapentin Swelling  . Ezetimibe Other (See Comments)    Muscle pain    Current Outpatient Medications  Medication Sig Dispense Refill  . ADVAIR DISKUS 500-50 MCG/DOSE AEPB Inhale 1 puff into the lungs 2 (two) times daily.     Marland Kitchen aspirin EC 81 MG tablet Take 81 mg by mouth daily.    . Aspirin-Caffeine (BAYER BACK & BODY PAIN EX ST PO) Take 2 tablets by mouth 2 (two) times daily as needed (for back pain).     . baclofen (LIORESAL) 10 MG tablet Take 10 mg by mouth at bedtime.  9  . co-enzyme Q-10 30 MG capsule Take 30 mg by mouth daily.    . DULoxetine (CYMBALTA) 30 MG capsule Take 30 mg by mouth every morning. Take with 60 mg to total 90 mg once daily  11  . DULoxetine (CYMBALTA) 60 MG capsule Take 60 mg by mouth every morning. Take with 30 mg to total 90 mg once daily  11  . KLOR-CON M20 20 MEQ tablet TAKE 1 TABLET BY MOUTH EVERY DAY 60 tablet 2  . losartan-hydrochlorothiazide (HYZAAR) 100-25 MG tablet Take 1 tablet by mouth daily.     . magnesium 30 MG tablet Take 30 mg by mouth 2 (two) times daily.    . Melatonin 5 MG TABS Take 10 mg by mouth at bedtime as needed (sleep).    . montelukast (SINGULAIR) 10 MG tablet Take 10 mg by mouth at bedtime.     . Omega-3 Fatty Acids (FISH OIL) 1000 MG CAPS Take 1,000 mg by mouth daily.    Marland Kitchen omeprazole  (PRILOSEC) 20 MG capsule Take 20 mg by mouth every morning.     . potassium chloride SA (K-DUR,KLOR-CON) 20 MEQ tablet Take 2 tablets (40 mEq total) by mouth daily. This is in addition to ur regular dose of 67mq daily (Patient taking differently: Take 20 mEq by mouth daily. ) 6 tablet 0  . diphenoxylate-atropine (LOMOTIL) 2.5-0.025 MG tablet Take 1 tablet by mouth 4 (four) times daily as needed for diarrhea or loose stools. (Patient not taking: Reported on 06/19/2017) 30 tablet 1  . letrozole (FEMARA) 2.5 MG tablet Take 1 tablet (2.5 mg total) by mouth daily. 90 tablet 3  . lidocaine-prilocaine (EMLA) cream Apply 1 application as needed topically (for port access).    .Marland KitchenoxyCODONE (ROXICODONE) 5 MG immediate release tablet Take 1 tablet (5 mg total) by mouth every 4 (four) hours as needed for moderate pain or severe pain. (Patient not taking: Reported on 06/19/2017) 30 tablet 0  . oxyCODONE-acetaminophen (  PERCOCET) 10-325 MG tablet Take 1 tablet by mouth every 4 (four) hours as needed for pain. (Patient not taking: Reported on 06/19/2017) 30 tablet 0  . promethazine (PHENERGAN) 25 MG tablet TAKE 25 MG BY MOUTH EVERY 6-8 HOURS AS NEEDED FOR NAUSEA  2  . triamcinolone ointment (KENALOG) 0.5 % Apply 1 application topically 2 (two) times daily. Rash on bilateral upper extremities (Patient not taking: Reported on 06/19/2017) 30 g 0   Current Facility-Administered Medications  Medication Dose Route Frequency Provider Last Rate Last Dose  . 0.9 %  sodium chloride infusion   Intravenous Once Lloyd Huger, MD      . heparin lock flush 100 unit/mL  500 Units Intracatheter Once PRN Lloyd Huger, MD      . trastuzumab (HERCEPTIN) 450 mg in sodium chloride 0.9 % 250 mL chemo infusion  450 mg Intravenous Once Lloyd Huger, MD 542.9 mL/hr at 06/19/17 1029 450 mg at 06/19/17 1029   Facility-Administered Medications Ordered in Other Visits  Medication Dose Route Frequency Provider Last Rate Last  Dose  . 0.9 %  sodium chloride infusion   Intravenous Once Lloyd Huger, MD      . 0.9 %  sodium chloride infusion   Intravenous Once Lloyd Huger, MD      . 0.9 %  sodium chloride infusion   Intravenous Once Lloyd Huger, MD      . 0.9 %  sodium chloride infusion   Intravenous Continuous Grayland Ormond, Kathlene November, MD      . heparin lock flush 100 unit/mL  500 Units Intravenous Once Lloyd Huger, MD      . ondansetron [facility]) 8 mg in sodium chloride 0.9 % 50 mL IVPB   Intravenous Once Lloyd Huger, MD        OBJECTIVE: Vitals:   06/19/17 0905  BP: (!) 166/89  Pulse: 85  Resp: 18  Temp: (!) 97 F (36.1 C)     Body mass index is 27.36 kg/m.    ECOG FS:0 - Asymptomatic  General: Well-developed, well-nourished, no acute distress. Accompanied.  Eyes: Pink conjunctiva, anicteric sclera. Breasts: Exam deferred today per patient request. States she had a breast exam w/ Dr. Adonis Huguenin last week. Lungs: Clear to auscultation bilaterally. Heart: Regular rate and rhythm. No rubs, murmurs, or gallops. Abdomen: Soft, nontender, nondistended. No organomegaly noted, normoactive bowel sounds. Musculoskeletal: Mild edema.  No cyanosis, or clubbing. Neuro: Alert, answering all questions appropriately. Cranial nerves grossly intact. Skin: No rashes or petechiae noted. Psych: Normal affect.   LAB RESULTS:  Lab Results  Component Value Date   NA 139 06/19/2017   K 3.3 (L) 06/19/2017   CL 106 06/19/2017   CO2 26 06/19/2017   GLUCOSE 131 (H) 06/19/2017   BUN 20 06/19/2017   CREATININE 1.11 (H) 06/19/2017   CALCIUM 9.0 06/19/2017   PROT 7.5 06/19/2017   ALBUMIN 3.1 (L) 06/19/2017   AST 20 06/19/2017   ALT 11 (L) 06/19/2017   ALKPHOS 64 06/19/2017   BILITOT 0.3 06/19/2017   GFRNONAA 47 (L) 06/19/2017   GFRAA 54 (L) 06/19/2017    Lab Results  Component Value Date   WBC 11.1 (H) 06/19/2017   NEUTROABS 6.5 06/19/2017   HGB 11.8 (L) 06/19/2017   HCT 36.5  06/19/2017   MCV 85.2 06/19/2017   PLT 278 06/19/2017     STUDIES: Mm Breast Surgical Specimen  Result Date: 05/21/2017 CLINICAL DATA:  Post right breast lumpectomy and axillary  lymph node dissection. EXAM: SPECIMEN RADIOGRAPH OF THE RIGHT BREAST COMPARISON:  Previous exam(s). FINDINGS: Status post excision of the right breast. Four specimens were sent for examination. A ribbon shaped marker is seen within the first specimen at F 8. A spiral HydroMARK is seen in the 4 specimen at D 9. IMPRESSION: Specimen radiograph of the right breast. Electronically Signed   By: Fidela Salisbury M.D.   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  PLAN:    1. 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  Treatment was discontinued secondary to poor performance status.  Patient underwent her lumpectomy on March 21, 2017 which appears to be a complete pathologic response, but no biopsy clip was seen in her surgical specimen.  Her most recent surgery on May 21, 2017 to remove her retained clip, confirmed a complete pathological response.   MUGA scan from April 30, 2017 revealed an EF of 67% which is unchanged from previous. Would plan to repeat 07/2017.  Given the PR positivity of her tumor she will benefit from an aromatase inhibitor for 5 years. Discussed initiation of Letrozole. Discussed risks and side effects including but not limited to: bone fracture, allergic reaction, VTE, fatigue, bone/joint aches/pains, and hot flashes. Patient agrees to initiation of Letrozole and would anticipate completion in 05/2022.  DEXA scan 2016 revealed normal bone density. Discussed supplementation of Calcium 1260m and Vitamin D 800iu. Will get DEXA scan now and  plan to repeat every 2 years or again in 05/2019.   Mammograms scheduled through surgery, planned for 08/2017. Proceed with cycle 10 of 18 of maintenance Herceptin today. Return to clinic in 3 weeks for consideration of cycle 11. Patient will also receive 1 L of IV fluids today.  2. Renal insufficiency: Mild, monitor. 3. Nausea: Patient does not complain of this today.  Continue current antiemetics as ordered. 4. Hypokalemia: K 3.3 today. Continue oral potassium and increase intake of potassium rich foods as patient declines BID dosing.  5. Hypomagnesemia: Resolved.  Continue oral supplementation.  Patient does not require IV magnesium today. 6.  Incarcerated hernia: Continue follow-up with surgery as scheduled.     A total of (30) minutes of face-to-face time was spent with this patient with greater than 50% of that time in counseling and care-coordination.  Patient expressed understanding and was in agreement with this plan. She also understands that She can call clinic at any time with any questions, concerns, or complaints. Thank you for allowing me to participate in the care of this very pleasant patient.   Cancer Staging Malignant neoplasm of right female breast ([facility] Staging form: Breast, AJCC 8th Edition - Clinical stage from 10/16/2016: Stage IIB (cT2, cN1, cM0, G3, ER: Negative, PR: Positive, HER2: Positive) - Signed by FLloyd Huger MD on 10/16/2016   LBeckey Rutter DNP, AGNP-C [place] A[facility]pt[phone](office) 06/19/17 10:47 AM

## 2017-06-19 NOTE — Progress Notes (Signed)
Here for symptom mgt clinic-  C/o sore throat,cough,and nasal congestion

## 2017-07-08 NOTE — Progress Notes (Signed)
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  INTERVAL HISTORY: Patient returns to clinic today for further evaluation and continuation of Herceptin.  She currently feels well and is nearly back to her baseline.  She does not complain of weakness or fatigue today.  She has no neurologic complaints. She denies any recent fevers or illnesses.  She has no chest pain, cough, or shortness of breath. She denies any nausea, vomiting, constipation, or diarrhea.  She has no abdominal pain.  She has no urinary complaints. Patient offers no specific complaints today.  REVIEW OF SYSTEMS:   Review of Systems  Constitutional: Negative.  Negative for fever, malaise/fatigue and weight loss.  Respiratory: Negative.  Negative for cough and shortness of breath.   Cardiovascular: Negative.  Negative for chest pain and leg swelling.  Gastrointestinal: Negative.  Negative for abdominal pain, diarrhea and nausea.  Genitourinary: Negative.   Musculoskeletal: Negative.  Negative for joint pain.  Skin: Negative.  Negative for rash.  Neurological: Negative.  Negative for sensory change and weakness.  Psychiatric/Behavioral: Negative.  The patient is not nervous/anxious.     As per HPI. Otherwise, a complete review of systems is negative.  PAST MEDICAL HISTORY: Past Medical History:  Diagnosis Date  . Anxiety   . Arthritis   . Cancer [facility])    Right Breast Cancer  . COPD (chronic obstructive pulmonary disease) ([place])   . Depression   . Dyspnea    with exertion  . GERD (gastroesophageal reflux disease)   . Hyperlipidemia   .  Hypertension     PAST SURGICAL HISTORY: Past Surgical History:  Procedure Laterality Date  . ABDOMINAL HYSTERECTOMY  1990   Partial  . BREAST BIOPSY Right 10/04/2016   axilla lymph node and axillay tail mass biopsy. invasive mammary carcinoma  . BREAST LUMPECTOMY Right 03/21/2017  . BREAST LUMPECTOMY WITH NEEDLE LOCALIZATION Right 03/21/2017   Procedure: BREAST LUMPECTOMY WITH NEEDLE LOCALIZATION;  Surgeon: Clayburn Pert, MD;  Location: [facility] ORS;  Service: General;  Laterality: Right;  . DILATION AND CURETTAGE OF UTERUS    . INGUINAL HERNIA REPAIR Right 03/26/2017   Procedure: HERNIA REPAIR INGUINAL INCARCERATED;  Surgeon: Jules Husbands, MD;  Location: [facility] ORS;  Service: General;  Laterality: Right;  . PORTACATH PLACEMENT Left 10/24/2016   Procedure: INSERTION PORT-A-CATH;  Surgeon: [name], MD;  Location: [facility] ORS;  Service: General;  Laterality: Left;  . RE-EXCISION OF BREAST LUMPECTOMY Right 05/21/2017   Procedure: RE-EXCISION OF BREAST LUMPECTOMY;  Surgeon: Clayburn Pert, MD;  Location: [facility] ORS;  Service: General;  Laterality: Right;  . SENTINEL NODE BIOPSY Right 03/21/2017   Procedure: SENTINEL NODE BIOPSY;  Surgeon: Clayburn Pert, MD;  Location: [facility] ORS;  Service: General;  Laterality: Right;    FAMILY HISTORY: Family History  Problem Relation Age of Onset  . Colon cancer Mother   . Breast cancer Neg Hx     ADVANCED DIRECTIVES (Y/N):  N  HEALTH MAINTENANCE: Social History   Tobacco Use  . Smoking status: Former Smoker    Packs/day: 0.50    Types: Cigarettes    Last attempt to quit: 07/23/1988    Years since quitting: 28.9  .  Smokeless tobacco: Never Used  Substance Use Topics  . Alcohol use: No  . Drug use: No     Colonoscopy:  PAP:  Bone density:  Lipid panel:  Allergies  Allergen Reactions  . Penicillins Anaphylaxis, Swelling and Rash    Has patient had a PCN reaction causing immediate rash, facial/tongue/throat swelling, SOB or  lightheadedness with hypotension: Yes Has patient had a PCN reaction causing severe rash involving mucus membranes or skin necrosis: No Has patient had a PCN reaction that required hospitalization: No  Has patient had a PCN reaction occurring within the last 10 years: No If all of the above answers are "NO", then may proceed with Cephalosporin use.   . Atorvastatin Other (See Comments)    Muscle Pain  . Gabapentin Swelling  . Ezetimibe Other (See Comments)    Muscle pain    Current Outpatient Medications  Medication Sig Dispense Refill  . ADVAIR DISKUS 500-50 MCG/DOSE AEPB Inhale 1 puff into the lungs 2 (two) times daily.     Marland Kitchen aspirin EC 81 MG tablet Take 81 mg by mouth daily.    . Aspirin-Caffeine (BAYER BACK & BODY PAIN EX ST PO) Take 2 tablets by mouth 2 (two) times daily as needed (for back pain).     . baclofen (LIORESAL) 10 MG tablet Take 10 mg by mouth at bedtime.  9  . co-enzyme Q-10 30 MG capsule Take 30 mg by mouth daily.    . diphenoxylate-atropine (LOMOTIL) 2.5-0.025 MG tablet Take 1 tablet by mouth 4 (four) times daily as needed for diarrhea or loose stools. 30 tablet 1  . DULoxetine (CYMBALTA) 30 MG capsule Take 30 mg by mouth every morning. Take with 60 mg to total 90 mg once daily  11  . DULoxetine (CYMBALTA) 60 MG capsule Take 60 mg by mouth every morning. Take with 30 mg to total 90 mg once daily  11  . KLOR-CON M20 20 MEQ tablet TAKE 1 TABLET BY MOUTH EVERY DAY 60 tablet 2  . letrozole (FEMARA) 2.5 MG tablet Take 1 tablet (2.5 mg total) by mouth daily. 90 tablet 3  . lidocaine-prilocaine (EMLA) cream Apply 1 application as needed topically (for port access).    Marland Kitchen losartan-hydrochlorothiazide (HYZAAR) 100-25 MG tablet Take 1 tablet by mouth daily.     . magnesium 30 MG tablet Take 30 mg by mouth 2 (two) times daily.    . Melatonin 5 MG TABS Take 10 mg by mouth at bedtime as needed (sleep).    . montelukast (SINGULAIR) 10 MG tablet Take 10 mg by mouth at bedtime.     .  Omega-3 Fatty Acids (FISH OIL) 1000 MG CAPS Take 1,000 mg by mouth daily.    Marland Kitchen omeprazole (PRILOSEC) 20 MG capsule Take 20 mg by mouth every morning.     . potassium chloride SA (K-DUR,KLOR-CON) 20 MEQ tablet Take 2 tablets (40 mEq total) by mouth daily. This is in addition to ur regular dose of 91mq daily (Patient taking differently: Take 20 mEq by mouth daily. ) 6 tablet 0  . promethazine (PHENERGAN) 25 MG tablet TAKE 25 MG BY MOUTH EVERY 6-8 HOURS AS NEEDED FOR NAUSEA  2  . oxyCODONE (ROXICODONE) 5 MG immediate release tablet Take 1 tablet (5 mg total) by mouth every 4 (four) hours as needed for moderate pain or severe pain. (Patient not taking: Reported on 07/10/2017) 30 tablet 0  . oxyCODONE-acetaminophen (PERCOCET) 10-325 MG tablet Take 1 tablet by mouth every 4 (four)  hours as needed for pain. (Patient not taking: Reported on 07/10/2017) 30 tablet 0  . triamcinolone ointment (KENALOG) 0.5 % Apply 1 application topically 2 (two) times daily. Rash on bilateral upper extremities (Patient not taking: Reported on 07/10/2017) 30 g 0   No current facility-administered medications for this visit.    Facility-Administered Medications Ordered in Other Visits  Medication Dose Route Frequency Provider Last Rate Last Dose  . 0.9 %  sodium chloride infusion   Intravenous Once Lloyd Huger, MD      . 0.9 %  sodium chloride infusion   Intravenous Once Lloyd Huger, MD      . 0.9 %  sodium chloride infusion   Intravenous Once Lloyd Huger, MD      . 0.9 %  sodium chloride infusion   Intravenous Continuous Grayland Ormond, Kathlene November, MD      . ondansetron (ZOFRAN) 8 mg in sodium chloride 0.9 % 50 mL IVPB   Intravenous Once Lloyd Huger, MD        OBJECTIVE: Vitals:   07/10/17 0922  BP: (!) 162/84  Pulse: 76  Resp: 20  Temp: 97.6 F (36.4 C)     Body mass index is 28.03 kg/m.    ECOG FS:0 - Asymptomatic  General: Well-developed, well-nourished, no acute distress. Eyes: Pink  conjunctiva, anicteric sclera. Breasts: Exam deferred today. Lungs: Clear to auscultation bilaterally. Heart: Regular rate and rhythm. No rubs, murmurs, or gallops. Abdomen: Soft, nontender, nondistended. No organomegaly noted, normoactive bowel sounds. Musculoskeletal: Mild edema.  No cyanosis, or clubbing. Neuro: Alert, answering all questions appropriately. Cranial nerves grossly intact. Skin: No rashes or petechiae noted. Psych: Normal affect.   LAB RESULTS:  Lab Results  Component Value Date   NA 139 07/10/2017   K 3.8 07/10/2017   CL 105 07/10/2017   CO2 26 07/10/2017   GLUCOSE 109 (H) 07/10/2017   BUN 15 07/10/2017   CREATININE 1.04 (H) 07/10/2017   CALCIUM 8.7 (L) 07/10/2017   PROT 7.0 07/10/2017   ALBUMIN 2.8 (L) 07/10/2017   AST 21 07/10/2017   ALT 12 (L) 07/10/2017   ALKPHOS 69 07/10/2017   BILITOT 0.7 07/10/2017   GFRNONAA 51 (L) 07/10/2017   GFRAA 59 (L) 07/10/2017    Lab Results  Component Value Date   WBC 7.8 07/10/2017   NEUTROABS 4.0 07/10/2017   HGB 11.3 (L) 07/10/2017   HCT 34.3 (L) 07/10/2017   MCV 83.6 07/10/2017   PLT 284 07/10/2017     STUDIES: No results found.  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  PLAN:    1. 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  Treatment was discontinued secondary to poor performance status.  Patient underwent her lumpectomy on March 21, 2017 which appears to be a complete pathologic response, but no biopsy clip was seen in her surgical specimen.  Her most recent surgery on May 21, 2017 to remove her retained clip, confirmed a complete pathological response.  MUGA scan from April 30, 2017 revealed an EF of 67% which is unchanged from previous.  Repeat prior to next  infusion.  Patient was also given a referral to radiation oncology.  Given the PR positivity of her tumor, patient was given a prescription for letrozole which she will take  for a total of 5 years completing in February 2024.  Proceed with cycle 10 of 18 of maintenance Herceptin today.  Return to clinic in 3 weeks for consideration of cycle 11.  Patient will also receive 1 L of IV fluids today. 2. Renal insufficiency: Improving. 3. Nausea: Patient does not complain of this today.  Continue current antiemetics as ordered. 4. Hypokalemia: Resolved.  Continue oral potassium supplementation as prescribed. 5. Hypomagnesemia: Resolved.  Continue oral supplementation.  Patient does not require IV magnesium today. 6.  Incarcerated hernia: Continue follow-up with surgery as scheduled.     Patient expressed understanding and was in agreement with this plan. She also understands that She can call clinic at any time with any questions, concerns, or complaints.   Cancer Staging Malignant neoplasm of right female breast [facility]) Staging form: Breast, AJCC 8th Edition - Clinical stage from 10/16/2016: Stage IIB (cT2, cN1, cM0, G3, ER: Negative, PR: Positive, HER2: Positive) - Signed by Lloyd Huger, MD on 10/16/2016   Lloyd Huger, MD   07/10/2017 9:46 AM

## 2017-07-10 ENCOUNTER — Inpatient Hospital Stay: Payer: Medicare PPO

## 2017-07-10 ENCOUNTER — Inpatient Hospital Stay (HOSPITAL_BASED_OUTPATIENT_CLINIC_OR_DEPARTMENT_OTHER): Payer: Medicare PPO | Admitting: Oncology

## 2017-07-10 ENCOUNTER — Encounter: Payer: Self-pay | Admitting: Oncology

## 2017-07-10 ENCOUNTER — Encounter: Payer: Self-pay | Admitting: *Deleted

## 2017-07-10 ENCOUNTER — Inpatient Hospital Stay: Payer: Medicare PPO | Attending: Oncology

## 2017-07-10 VITALS — BP 162/84 | HR 76 | Temp 97.6°F | Resp 20 | Wt 153.2 lb

## 2017-07-10 DIAGNOSIS — K460 Unspecified abdominal hernia with obstruction, without gangrene: Secondary | ICD-10-CM | POA: Insufficient documentation

## 2017-07-10 DIAGNOSIS — Z171 Estrogen receptor negative status [ER-]: Secondary | ICD-10-CM | POA: Insufficient documentation

## 2017-07-10 DIAGNOSIS — Z5112 Encounter for antineoplastic immunotherapy: Secondary | ICD-10-CM | POA: Insufficient documentation

## 2017-07-10 DIAGNOSIS — N289 Disorder of kidney and ureter, unspecified: Secondary | ICD-10-CM

## 2017-07-10 DIAGNOSIS — C50411 Malignant neoplasm of upper-outer quadrant of right female breast: Secondary | ICD-10-CM | POA: Insufficient documentation

## 2017-07-10 DIAGNOSIS — E876 Hypokalemia: Secondary | ICD-10-CM | POA: Insufficient documentation

## 2017-07-10 DIAGNOSIS — C50011 Malignant neoplasm of nipple and areola, right female breast: Secondary | ICD-10-CM

## 2017-07-10 LAB — COMPREHENSIVE METABOLIC PANEL
ALBUMIN: 2.8 g/dL — AB (ref 3.5–5.0)
ALT: 12 U/L — AB (ref 14–54)
AST: 21 U/L (ref 15–41)
Alkaline Phosphatase: 69 U/L (ref 38–126)
Anion gap: 8 (ref 5–15)
BUN: 15 mg/dL (ref 6–20)
CO2: 26 mmol/L (ref 22–32)
CREATININE: 1.04 mg/dL — AB (ref 0.44–1.00)
Calcium: 8.7 mg/dL — ABNORMAL LOW (ref 8.9–10.3)
Chloride: 105 mmol/L (ref 101–111)
GFR calc Af Amer: 59 mL/min — ABNORMAL LOW (ref >60)
GFR, EST NON AFRICAN AMERICAN: 51 mL/min — AB (ref >60)
GLUCOSE: 109 mg/dL — AB (ref 65–99)
POTASSIUM: 3.8 mmol/L (ref 3.5–5.1)
SODIUM: 139 mmol/L (ref 135–145)
Total Bilirubin: 0.7 mg/dL (ref 0.3–1.2)
Total Protein: 7 g/dL (ref 6.5–8.1)

## 2017-07-10 LAB — CBC WITH DIFFERENTIAL/PLATELET
Basophils Absolute: 0.1 10*3/uL (ref 0–0.1)
Basophils Relative: 1 %
EOS ABS: 0.5 10*3/uL (ref 0–0.7)
EOS PCT: 6 %
HCT: 34.3 % — ABNORMAL LOW (ref 35.0–47.0)
Hemoglobin: 11.3 g/dL — ABNORMAL LOW (ref 12.0–16.0)
LYMPHS ABS: 2.5 10*3/uL (ref 1.0–3.6)
LYMPHS PCT: 32 %
MCH: 27.6 pg (ref 26.0–34.0)
MCHC: 33 g/dL (ref 32.0–36.0)
MCV: 83.6 fL (ref 80.0–100.0)
MONOS PCT: 10 %
Monocytes Absolute: 0.8 10*3/uL (ref 0.2–0.9)
Neutro Abs: 4 10*3/uL (ref 1.4–6.5)
Neutrophils Relative %: 51 %
Platelets: 284 10*3/uL (ref 150–440)
RBC: 4.1 MIL/uL (ref 3.80–5.20)
RDW: 16 % — ABNORMAL HIGH (ref 11.5–14.5)
WBC: 7.8 10*3/uL (ref 3.6–11.0)

## 2017-07-10 LAB — MAGNESIUM: Magnesium: 1.9 mg/dL (ref 1.7–2.4)

## 2017-07-10 MED ORDER — HEPARIN SOD (PORK) LOCK FLUSH 100 UNIT/ML IV SOLN
INTRAVENOUS | Status: AC
  Filled 2017-07-10: qty 5

## 2017-07-10 MED ORDER — HEPARIN SOD (PORK) LOCK FLUSH 100 UNIT/ML IV SOLN
500.0000 [IU] | Freq: Once | INTRAVENOUS | Status: AC | PRN
  Administered 2017-07-10: 500 [IU]

## 2017-07-10 MED ORDER — DIPHENHYDRAMINE HCL 25 MG PO CAPS
25.0000 mg | ORAL_CAPSULE | Freq: Once | ORAL | Status: AC
  Administered 2017-07-10: 25 mg via ORAL
  Filled 2017-07-10: qty 1

## 2017-07-10 MED ORDER — SODIUM CHLORIDE 0.9% FLUSH
10.0000 mL | INTRAVENOUS | Status: DC | PRN
  Filled 2017-07-10: qty 10

## 2017-07-10 MED ORDER — ACETAMINOPHEN 325 MG PO TABS
650.0000 mg | ORAL_TABLET | Freq: Once | ORAL | Status: AC
  Administered 2017-07-10: 650 mg via ORAL
  Filled 2017-07-10: qty 2

## 2017-07-10 MED ORDER — SODIUM CHLORIDE 0.9 % IV SOLN
Freq: Once | INTRAVENOUS | Status: AC
  Administered 2017-07-10: 10:00:00 via INTRAVENOUS
  Filled 2017-07-10: qty 1000

## 2017-07-10 MED ORDER — TRASTUZUMAB CHEMO 150 MG IV SOLR
450.0000 mg | Freq: Once | INTRAVENOUS | Status: AC
  Administered 2017-07-10: 450 mg via INTRAVENOUS
  Filled 2017-07-10: qty 21.43

## 2017-07-10 NOTE — Progress Notes (Signed)
Pt in for follow up and treatment.  No concerns or difficulties verbalized.

## 2017-07-12 ENCOUNTER — Ambulatory Visit
Admission: RE | Admit: 2017-07-12 | Discharge: 2017-07-12 | Disposition: A | Discharge status: Home / Self Care | Payer: Medicare PPO | Source: Ambulatory Visit | Attending: Nurse Practitioner | Admitting: Nurse Practitioner

## 2017-07-12 DIAGNOSIS — Z171 Estrogen receptor negative status [ER-]: Secondary | ICD-10-CM | POA: Diagnosis present

## 2017-07-12 DIAGNOSIS — C50411 Malignant neoplasm of upper-outer quadrant of right female breast: Secondary | ICD-10-CM | POA: Diagnosis present

## 2017-07-13 ENCOUNTER — Telehealth: Payer: Self-pay | Admitting: Nurse Practitioner

## 2017-07-13 NOTE — Telephone Encounter (Signed)
Called patient with results from bone density scan. All questions answered.

## 2017-07-18 ENCOUNTER — Other Ambulatory Visit: Payer: Self-pay

## 2017-07-18 ENCOUNTER — Encounter: Payer: Self-pay | Admitting: Radiation Oncology

## 2017-07-18 ENCOUNTER — Encounter: Payer: Medicare PPO | Admitting: General Surgery

## 2017-07-18 ENCOUNTER — Ambulatory Visit
Admission: RE | Admit: 2017-07-18 | Discharge: 2017-07-18 | Disposition: A | Discharge status: Home / Self Care | Payer: Medicare PPO | Source: Ambulatory Visit | Attending: Radiation Oncology | Admitting: Radiation Oncology

## 2017-07-18 VITALS — BP 190/92 | HR 78 | Temp 97.4°F | Wt 153.8 lb

## 2017-07-18 DIAGNOSIS — E785 Hyperlipidemia, unspecified: Secondary | ICD-10-CM | POA: Diagnosis not present

## 2017-07-18 DIAGNOSIS — J449 Chronic obstructive pulmonary disease, unspecified: Secondary | ICD-10-CM | POA: Insufficient documentation

## 2017-07-18 DIAGNOSIS — R0602 Shortness of breath: Secondary | ICD-10-CM | POA: Insufficient documentation

## 2017-07-18 DIAGNOSIS — Z171 Estrogen receptor negative status [ER-]: Secondary | ICD-10-CM | POA: Diagnosis not present

## 2017-07-18 DIAGNOSIS — Z79899 Other long term (current) drug therapy: Secondary | ICD-10-CM | POA: Insufficient documentation

## 2017-07-18 DIAGNOSIS — M129 Arthropathy, unspecified: Secondary | ICD-10-CM | POA: Insufficient documentation

## 2017-07-18 DIAGNOSIS — Z7982 Long term (current) use of aspirin: Secondary | ICD-10-CM | POA: Diagnosis not present

## 2017-07-18 DIAGNOSIS — I10 Essential (primary) hypertension: Secondary | ICD-10-CM | POA: Insufficient documentation

## 2017-07-18 DIAGNOSIS — C50411 Malignant neoplasm of upper-outer quadrant of right female breast: Secondary | ICD-10-CM | POA: Insufficient documentation

## 2017-07-18 DIAGNOSIS — Z87891 Personal history of nicotine dependence: Secondary | ICD-10-CM | POA: Insufficient documentation

## 2017-07-18 DIAGNOSIS — K219 Gastro-esophageal reflux disease without esophagitis: Secondary | ICD-10-CM | POA: Diagnosis not present

## 2017-07-18 DIAGNOSIS — F419 Anxiety disorder, unspecified: Secondary | ICD-10-CM | POA: Diagnosis not present

## 2017-07-18 NOTE — Consult Note (Signed)
NEW PATIENT EVALUATION  Name: Krista Cisneros  MRN: [id number]  Date:   07/18/2017     DOB: 1940-08-30   This 77 y.o. female patient presents to the clinic for initial evaluation of stage IIB ER negative PR positive HER-2/neu positive invasive mammary carcinoma of the right breast upper outer quadrant status post neoadjuvant chemotherapy Herceptin than wide local excision and sentinel lymph node biopsy.  REFERRING PHYSICIAN: Sharyne Peach, MD  CHIEF COMPLAINT:  Chief Complaint  Patient presents with  . Breast Cancer    Pt is here for initial consultation of breast cancer.      DIAGNOSIS: The encounter diagnosis was Malignant neoplasm of upper-outer quadrant of right breast in female, estrogen receptor negative ([place]).   PREVIOUS INVESTIGATIONS:  Mammograms and ultrasound reviewed Pathology reports reviewed Clinical notes reviewed  HPI: patient is a 77 year old female originally presented with an abnormal mammogram showing asymmetry in the right breast in the upper outer quadrant. There was a spiculated mass partially visualized in the right axilla with several adjacent enlarged axillary lymph nodes largest measuring 2.4 cm.ultrasound-guided biopsy of the right axillary lymph node revealed metastatic mammary carcinoma.there was extracapsular extension present.she was started on new adjuvant chemotherapy receiving Taxotere carboplatin and Herceptin and per [place].she underwent 5 of 6 cycles which will of chemotherapy which was discontinued secondary to poor performance status. She then underwent on 03/21/2017 wide local excision with complete pathologic response. No biopsy could was seen she underwent a reexcision with the clipper he moved again confirming a complete pathologic response.pathology showed axillary tissue with areas of necrosis consistent with tumor response. One lymph nodeshowed changes consistent with new adjuvant therapy.6 lymph nodes were removed in total. Patient is  done well postoperatively is now referred to radiation oncology for opinion. She scheduled to continue with Herceptin. She specifically denies breast tenderness cough or bone pain.  PLANNED TREATMENT REGIMEN: right breast and peripheral lymphatic radiation  PAST MEDICAL HISTORY:  has a past medical history of Anxiety, Arthritis, Cancer ([place]), COPD (chronic obstructive pulmonary disease) ([place]), Depression, Dyspnea, GERD (gastroesophageal reflux disease), Hyperlipidemia, and Hypertension.    PAST SURGICAL HISTORY:  Past Surgical History:  Procedure Laterality Date  . ABDOMINAL HYSTERECTOMY  1990   Partial  . BREAST BIOPSY Right 10/04/2016   axilla lymph node and axillay tail mass biopsy. invasive mammary carcinoma  . BREAST LUMPECTOMY Right 03/21/2017  . BREAST LUMPECTOMY WITH NEEDLE LOCALIZATION Right 03/21/2017   Procedure: BREAST LUMPECTOMY WITH NEEDLE LOCALIZATION;  Surgeon: Clayburn Pert, MD;  Location: [facility] ORS;  Service: General;  Laterality: Right;  . DILATION AND CURETTAGE OF UTERUS    . INGUINAL HERNIA REPAIR Right 03/26/2017   Procedure: HERNIA REPAIR INGUINAL INCARCERATED;  Surgeon: Jules Husbands, MD;  Location: [facility] ORS;  Service: General;  Laterality: Right;  . PORTACATH PLACEMENT Left 10/24/2016   Procedure: INSERTION PORT-A-CATH;  Surgeon: [name], MD;  Location: [facility] ORS;  Service: General;  Laterality: Left;  . RE-EXCISION OF BREAST LUMPECTOMY Right 05/21/2017   Procedure: RE-EXCISION OF BREAST LUMPECTOMY;  Surgeon: Clayburn Pert, MD;  Location: [facility] ORS;  Service: General;  Laterality: Right;  . SENTINEL NODE BIOPSY Right 03/21/2017   Procedure: SENTINEL NODE BIOPSY;  Surgeon: Clayburn Pert, MD;  Location: [facility] ORS;  Service: General;  Laterality: Right;    FAMILY HISTORY: family history includes Colon cancer in her mother.  SOCIAL HISTORY:  reports that she quit smoking about 29 years ago. Her smoking use included cigarettes. She smoked 0.50 packs per day.  She has never used smokeless tobacco. She reports that she does not drink alcohol or use drugs.  ALLERGIES: Penicillins; Atorvastatin; Gabapentin; and Ezetimibe  MEDICATIONS:  Current Outpatient Medications  Medication Sig Dispense Refill  . ADVAIR DISKUS 500-50 MCG/DOSE AEPB Inhale 1 puff into the lungs 2 (two) times daily.     Marland Kitchen aspirin EC 81 MG tablet Take 81 mg by mouth daily.    . Aspirin-Caffeine (BAYER BACK & BODY PAIN EX ST PO) Take 2 tablets by mouth 2 (two) times daily as needed (for back pain).     . baclofen (LIORESAL) 10 MG tablet Take 10 mg by mouth at bedtime.  9  . co-enzyme Q-10 30 MG capsule Take 30 mg by mouth daily.    . diphenoxylate-atropine (LOMOTIL) 2.5-0.025 MG tablet Take 1 tablet by mouth 4 (four) times daily as needed for diarrhea or loose stools. 30 tablet 1  . DULoxetine (CYMBALTA) 30 MG capsule Take 30 mg by mouth every morning. Take with 60 mg to total 90 mg once daily  11  . DULoxetine (CYMBALTA) 60 MG capsule Take 60 mg by mouth every morning. Take with 30 mg to total 90 mg once daily  11  . KLOR-CON M20 20 MEQ tablet TAKE 1 TABLET BY MOUTH EVERY DAY 60 tablet 2  . lidocaine-prilocaine (EMLA) cream Apply 1 application as needed topically (for port access).    Marland Kitchen losartan-hydrochlorothiazide (HYZAAR) 100-25 MG tablet Take 1 tablet by mouth daily.     . magnesium 30 MG tablet Take 30 mg by mouth 2 (two) times daily.    . Melatonin 5 MG TABS Take 10 mg by mouth at bedtime as needed (sleep).    . montelukast (SINGULAIR) 10 MG tablet Take 10 mg by mouth at bedtime.     . Omega-3 Fatty Acids (FISH OIL) 1000 MG CAPS Take 1,000 mg by mouth daily.    Marland Kitchen omeprazole (PRILOSEC) 20 MG capsule Take 20 mg by mouth every morning.     . potassium chloride SA (K-DUR,KLOR-CON) 20 MEQ tablet Take 2 tablets (40 mEq total) by mouth daily. This is in addition to ur regular dose of 29mq daily (Patient taking differently: Take 20 mEq by mouth daily. ) 6 tablet 0  . letrozole (FEMARA)  2.5 MG tablet Take 1 tablet (2.5 mg total) by mouth daily. (Patient not taking: Reported on 07/18/2017) 90 tablet 3  . oxyCODONE (ROXICODONE) 5 MG immediate release tablet Take 1 tablet (5 mg total) by mouth every 4 (four) hours as needed for moderate pain or severe pain. (Patient not taking: Reported on 07/10/2017) 30 tablet 0  . oxyCODONE-acetaminophen (PERCOCET) 10-325 MG tablet Take 1 tablet by mouth every 4 (four) hours as needed for pain. (Patient not taking: Reported on 07/10/2017) 30 tablet 0  . promethazine (PHENERGAN) 25 MG tablet TAKE 25 MG BY MOUTH EVERY 6-8 HOURS AS NEEDED FOR NAUSEA  2  . triamcinolone ointment (KENALOG) 0.5 % Apply 1 application topically 2 (two) times daily. Rash on bilateral upper extremities (Patient not taking: Reported on 07/10/2017) 30 g 0   No current facility-administered medications for this encounter.    Facility-Administered Medications Ordered in Other Encounters  Medication Dose Route Frequency Provider Last Rate Last Dose  . 0.9 %  sodium chloride infusion   Intravenous Once FLloyd Huger MD      . 0.9 %  sodium chloride infusion   Intravenous Once FLloyd Huger MD      . 0.9 %  sodium chloride infusion   Intravenous Once Lloyd Huger, MD      . 0.9 %  sodium chloride infusion   Intravenous Continuous Grayland Ormond, Kathlene November, MD      . ondansetron (ZOFRAN) 8 mg in sodium chloride 0.9 % 50 mL IVPB   Intravenous Once Lloyd Huger, MD        ECOG PERFORMANCE STATUS:  0 - Asymptomatic  REVIEW OF SYSTEMS:  Patient denies any weight loss, fatigue, weakness, fever, chills or night sweats. Patient denies any loss of vision, blurred vision. Patient denies any ringing  of the ears or hearing loss. No irregular heartbeat. Patient denies heart murmur or history of fainting. Patient denies any chest pain or pain radiating to her upper extremities. Patient denies any shortness of breath, difficulty breathing at night, cough or hemoptysis.  Patient denies any swelling in the lower legs. Patient denies any nausea vomiting, vomiting of blood, or coffee ground material in the vomitus. Patient denies any stomach pain. Patient states has had normal bowel movements no significant constipation or diarrhea. Patient denies any dysuria, hematuria or significant nocturia. Patient denies any problems walking, swelling in the joints or loss of balance. Patient denies any skin changes, loss of hair or loss of weight. Patient denies any excessive worrying or anxiety or significant depression. Patient denies any problems with insomnia. Patient denies excessive thirst, polyuria, polydipsia. Patient denies any swollen glands, patient denies easy bruising or easy bleeding. Patient denies any recent infections, allergies or URI. Patient "s visual fields have not changed significantly in recent time.    PHYSICAL EXAM: BP (!) 190/92   Pulse 78   Temp (!) 97.4 F (36.3 C)   Wt 153 lb 12.3 oz (69.7 kg)   BMI 28.13 kg/m  Right breast is a single incision site in the right axilla. No dominant mass or nodularity is noted in either breast in 2 positions examined. No axillary or supraclavicular adenopathy is appreciated. Well-developed well-nourished patient in NAD. HEENT reveals PERLA, EOMI, discs not visualized.  Oral cavity is clear. No oral mucosal lesions are identified. Neck is clear without evidence of cervical or supraclavicular adenopathy. Lungs are clear to A&P. Cardiac examination is essentially unremarkable with regular rate and rhythm without murmur rub or thrill. Abdomen is benign with no organomegaly or masses noted. Motor sensory and DTR levels are equal and symmetric in the upper and lower extremities. Cranial nerves II through XII are grossly intact. Proprioception is intact. No peripheral adenopathy or edema is identified. No motor or sensory levels are noted. Crude visual fields are within normal range.  LABORATORY DATA: pathology reports  reviewed    RADIOLOGY RESULTS:mammograms and ultrasound all reviewed and compatible with the above-stated findings   IMPRESSION: stage IIB ER negative PR positive HER-2/neu overexpressed invasive mammary carcinoma of the right breast status post neoadjuvant chemotherapy than wide local excision in 77 year old female  PLAN: at this time based on her poor prognostic features including extracapsular extensionof her axillary lymph node, limited axillary dissection, patient receiving lumpectomy after neoadjuvant chemotherapy I would start highly recommended radiation therapy to her right breast and peripheral lymphatics. Would treat both areas to 5040 cGy in 28 fractions. Would boost the axillary scar another 1600 cGy based on the evidence of extracapsular extension and initial questioning of resection area with need for reexcision to remove initial guiding clip. Risks and benefits of treatment including skin reaction fatigue alteration of blood counts possible inclusion of superficial lung possible slight chance of  lymphedema of her right upper extremity all were discussed in detail with the patient and her daughter. They both seem to comprehend my treatment plan well.There will be extra effort by both professional staff as well as technical staff to coordinate and manage concurrent chemoradiation and ensuing side effects during hertreatments.I've [place] 7 ordered CT simulation for early next week. Patient will consider continue on her Herceptin therapy has already been given a prescription for letrozole which we will ask her to start her prescription after completion of radiation.  I would like to take this opportunity to thank you for allowing me to participate in the care of your patient.Noreene Filbert, MD

## 2017-07-19 ENCOUNTER — Ambulatory Visit (INDEPENDENT_AMBULATORY_CARE_PROVIDER_SITE_OTHER): Payer: Medicare PPO | Admitting: Cardiothoracic Surgery

## 2017-07-19 ENCOUNTER — Encounter: Payer: Self-pay | Admitting: Cardiothoracic Surgery

## 2017-07-19 VITALS — BP 130/80 | HR 80 | Temp 98.3°F | Wt 155.0 lb

## 2017-07-19 DIAGNOSIS — Z4889 Encounter for other specified surgical aftercare: Secondary | ICD-10-CM

## 2017-07-19 NOTE — Patient Instructions (Signed)
Please give [place] a call in case you have any questions.   We will see you after your oncologist states that we could remove your port-a-cath.

## 2017-07-19 NOTE — Progress Notes (Signed)
Krista Cisneros returns today in follow-up.  She had a reexcision of a right axillary clip secondary to pain with elevation of her arm.  Since that time she has had no problems whatsoever.  Her right axillary incision is free of any erythema or drainage.  Her left-sided Port-A-Cath is also clean dry and intact.  She states that she feels well overall and understands that she may need to have her port removed in the future.  I explained to her that we be happy to provide that service to her.  We did not make a return appointment for her but would be happy to see her should the need arise.

## 2017-07-23 ENCOUNTER — Ambulatory Visit
Admission: RE | Admit: 2017-07-23 | Discharge: 2017-07-23 | Disposition: A | Discharge status: Home / Self Care | Payer: Medicare PPO | Source: Ambulatory Visit | Attending: Oncology | Admitting: Oncology

## 2017-07-23 DIAGNOSIS — Z171 Estrogen receptor negative status [ER-]: Secondary | ICD-10-CM | POA: Diagnosis not present

## 2017-07-23 DIAGNOSIS — C50411 Malignant neoplasm of upper-outer quadrant of right female breast: Secondary | ICD-10-CM | POA: Diagnosis present

## 2017-07-23 IMAGING — NM NM CARDIA MUGA REST
9 series · 41 of 41 positions shown · non-contrast
Comparison: [DATE]

CLINICAL DATA: RIGHT breast cancer, cardiotoxic chemotherapy

EXAM:
NUCLEAR MEDICINE CARDIAC BLOOD POOL IMAGING (MUGA)
TECHNIQUE: Cardiac multi-gated acquisition was performed at rest following
intravenous injection of [YR] labeled red blood cells.
RADIOPHARMACEUTICALS:  22.876 mCi [YR] pertechnetate in-vitro
labeled red blood cells IV

[Series 1000: 45 lao-gated (functional) · 3.30mm/px · 8 of 8 slices shown]
[im 1/8]
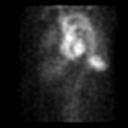
[im 2/8]
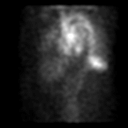
[im 3/8]
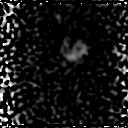
[im 4/8  full-range]
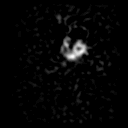
[im 5/8  full-range]
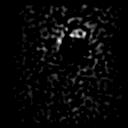
[im 6/8  full-range]
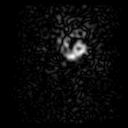
[im 7/8]
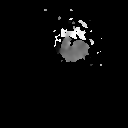
[im 8/8]
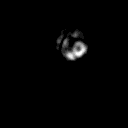

[Series 1000: anterior · 3.30mm/px · 1 of 1 slices shown]
[im 1/1]
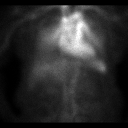

[Series 1000: 45 lao-gated (results) · 3.30mm/px · 6 of 24 frames shown]
[frame 3/24]
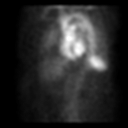
[frame 7/24]
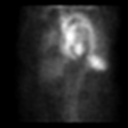
[frame 11/24]
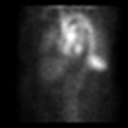
[frame 15/24]
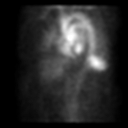
[frame 19/24]
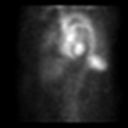
[frame 23/24]
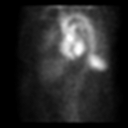

[Series 1000: 45 lao-gated · 3.30mm/px · 6 of 24 frames shown]
[frame 3/24]
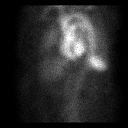
[frame 7/24]
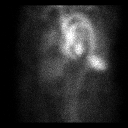
[frame 11/24]
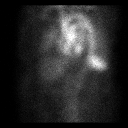
[frame 15/24]
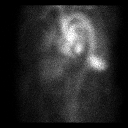
[frame 19/24]
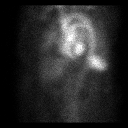
[frame 23/24]
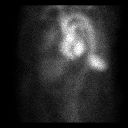

[Series 1000: 70 degree-gated · 3.30mm/px · 6 of 24 frames shown]
[frame 3/24]
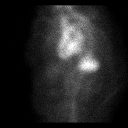
[frame 7/24]
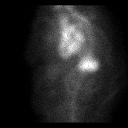
[frame 11/24]
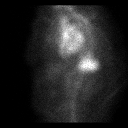
[frame 15/24]
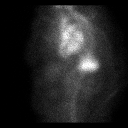
[frame 19/24]
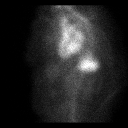
[frame 23/24]
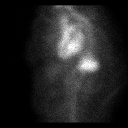

[Series 1000: 45 lao-gated (original with roi) · 3.30mm/px · 6 of 24 frames shown]
[frame 3/24]
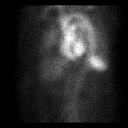
[frame 7/24]
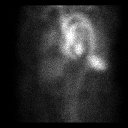
[frame 11/24]
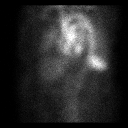
[frame 15/24]
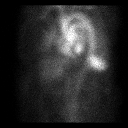
[frame 19/24]
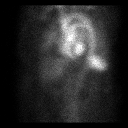
[frame 23/24]
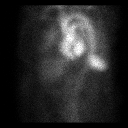

[Series 1000: 70 degree · 3.30mm/px · 1 of 1 slices shown]
[im 1/1]
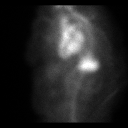

[Series 1000: anterior-gated · 3.30mm/px · 6 of 24 frames shown]
[frame 3/24]
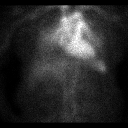
[frame 7/24]
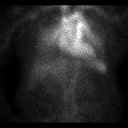
[frame 11/24]
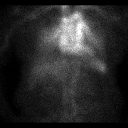
[frame 15/24]
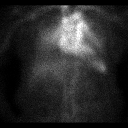
[frame 19/24]
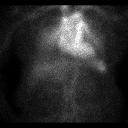
[frame 23/24]
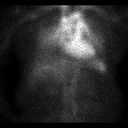

[Series 1000: 45 lao · 3.30mm/px · 1 of 1 slices shown]
[im 1/1]
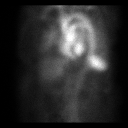

[41 of 41 positions shown; findings below may reference images not displayed]

FINDINGS: Calculated LEFT ventricular ejection fraction is 66%, unchanged from
67% on previous exam. Cine analysis of the LEFT ventricle in 3
projections demonstrates normal LEFT ventricular wall motion.
IMPRESSION: Normal LEFT ventricular ejection fraction of 66% unchanged since
[DATE].

No focal LEFT ventricular wall motion abnormalities.

## 2017-07-23 MED ORDER — TECHNETIUM TC 99M-LABELED RED BLOOD CELLS IV KIT
22.8760 | PACK | Freq: Once | INTRAVENOUS | Status: AC | PRN
  Administered 2017-07-23: 22.876 via INTRAVENOUS

## 2017-07-24 ENCOUNTER — Ambulatory Visit
Admission: RE | Admit: 2017-07-24 | Discharge: 2017-07-24 | Disposition: A | Discharge status: Home / Self Care | Payer: Medicare PPO | Source: Ambulatory Visit | Attending: Radiation Oncology | Admitting: Radiation Oncology

## 2017-07-24 DIAGNOSIS — Z171 Estrogen receptor negative status [ER-]: Secondary | ICD-10-CM | POA: Diagnosis not present

## 2017-07-24 DIAGNOSIS — Z510 Encounter for antineoplastic radiation therapy: Secondary | ICD-10-CM | POA: Insufficient documentation

## 2017-07-24 DIAGNOSIS — C50411 Malignant neoplasm of upper-outer quadrant of right female breast: Secondary | ICD-10-CM | POA: Insufficient documentation

## 2017-07-29 NOTE — Progress Notes (Signed)
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  INTERVAL HISTORY: Patient returns to clinic today for further evaluation and consideration of cycle 13 of Herceptin.  She currently feels well and asymptomatic.  She continues to have a mild, chronic weakness and fatigue.  She has no neurologic complaints. She denies any recent fevers or illnesses.  She has no chest pain, cough, or shortness of breath. She denies any nausea, vomiting, constipation, or diarrhea.  She has no abdominal pain.  She has no urinary complaints. Patient offers no specific complaints today.  REVIEW OF SYSTEMS:   Review of Systems  Constitutional: Negative.  Negative for fever, malaise/fatigue and weight loss.  Respiratory: Negative.  Negative for cough and shortness of breath.   Cardiovascular: Negative.  Negative for chest pain and leg swelling.  Gastrointestinal: Negative.  Negative for abdominal pain, constipation, diarrhea and nausea.  Genitourinary: Negative.  Negative for dysuria.  Musculoskeletal: Negative.  Negative for joint pain.  Skin: Negative.  Negative for rash.  Neurological: Negative.  Negative for sensory change, focal weakness and weakness.  Psychiatric/Behavioral: Negative.  The patient is not nervous/anxious.     As per HPI. Otherwise, a complete review of systems is negative.  PAST MEDICAL HISTORY: Past Medical History:  Diagnosis Date  . Anxiety   . Arthritis   . Cancer [facility])    Right Breast Cancer  . COPD (chronic obstructive pulmonary disease) ([place])   . Depression   . Dyspnea    with exertion  . GERD  (gastroesophageal reflux disease)   . Hyperlipidemia   . Hypertension     PAST SURGICAL HISTORY: Past Surgical History:  Procedure Laterality Date  . ABDOMINAL HYSTERECTOMY  1990   Partial  . BREAST BIOPSY Right 10/04/2016   axilla lymph node and axillay tail mass biopsy. invasive mammary carcinoma  . BREAST LUMPECTOMY Right 03/21/2017  . BREAST LUMPECTOMY WITH NEEDLE LOCALIZATION Right 03/21/2017   Procedure: BREAST LUMPECTOMY WITH NEEDLE LOCALIZATION;  Surgeon: Clayburn Pert, MD;  Location: [facility] ORS;  Service: General;  Laterality: Right;  . DILATION AND CURETTAGE OF UTERUS    . INGUINAL HERNIA REPAIR Right 03/26/2017   Procedure: HERNIA REPAIR INGUINAL INCARCERATED;  Surgeon: Jules Husbands, MD;  Location: [facility] ORS;  Service: General;  Laterality: Right;  . PORTACATH PLACEMENT Left 10/24/2016   Procedure: INSERTION PORT-A-CATH;  Surgeon: [name], MD;  Location: [facility] ORS;  Service: General;  Laterality: Left;  . RE-EXCISION OF BREAST LUMPECTOMY Right 05/21/2017   Procedure: RE-EXCISION OF BREAST LUMPECTOMY;  Surgeon: Clayburn Pert, MD;  Location: [facility] ORS;  Service: General;  Laterality: Right;  . SENTINEL NODE BIOPSY Right 03/21/2017   Procedure: SENTINEL NODE BIOPSY;  Surgeon: Clayburn Pert, MD;  Location: [facility] ORS;  Service: General;  Laterality: Right;    FAMILY HISTORY: Family History  Problem Relation Age of Onset  . Colon cancer Mother   . Breast cancer Neg Hx     ADVANCED DIRECTIVES (Y/N):  N  HEALTH MAINTENANCE: Social History   Tobacco Use  . Smoking status: Former Smoker    Packs/day: 0.50    Types: Cigarettes    Last attempt to quit: 07/23/1988  Years since quitting: 29.0  . Smokeless tobacco: Never Used  Substance Use Topics  . Alcohol use: No  . Drug use: No     Colonoscopy:  PAP:  Bone density:  Lipid panel:  Allergies  Allergen Reactions  . Penicillins Anaphylaxis, Swelling and Rash    Has patient had a PCN reaction causing  immediate rash, facial/tongue/throat swelling, SOB or lightheadedness with hypotension: Yes Has patient had a PCN reaction causing severe rash involving mucus membranes or skin necrosis: No Has patient had a PCN reaction that required hospitalization: No  Has patient had a PCN reaction occurring within the last 10 years: No If all of the above answers are "NO", then may proceed with Cephalosporin use.   . Atorvastatin Other (See Comments)    Muscle Pain  . Gabapentin Swelling  . Ezetimibe Other (See Comments)    Muscle pain    Current Outpatient Medications  Medication Sig Dispense Refill  . ADVAIR DISKUS 500-50 MCG/DOSE AEPB Inhale 1 puff into the lungs 2 (two) times daily.     Marland Kitchen aspirin EC 81 MG tablet Take 81 mg by mouth daily.    . Aspirin-Caffeine (BAYER BACK & BODY PAIN EX ST PO) Take 2 tablets by mouth 2 (two) times daily as needed (for back pain).     . baclofen (LIORESAL) 10 MG tablet Take 10 mg by mouth at bedtime.  9  . co-enzyme Q-10 30 MG capsule Take 30 mg by mouth daily.    . DULoxetine (CYMBALTA) 30 MG capsule Take 30 mg by mouth every morning. Take with 60 mg to total 90 mg once daily  11  . DULoxetine (CYMBALTA) 60 MG capsule Take 60 mg by mouth every morning. Take with 30 mg to total 90 mg once daily  11  . KLOR-CON M20 20 MEQ tablet TAKE 1 TABLET BY MOUTH EVERY DAY 60 tablet 2  . letrozole (FEMARA) 2.5 MG tablet Take 1 tablet (2.5 mg total) by mouth daily. 90 tablet 3  . losartan-hydrochlorothiazide (HYZAAR) 100-25 MG tablet Take 1 tablet by mouth daily.     . magnesium 30 MG tablet Take 30 mg by mouth 2 (two) times daily.    . Melatonin 5 MG TABS Take 10 mg by mouth at bedtime as needed (sleep).    . montelukast (SINGULAIR) 10 MG tablet Take 10 mg by mouth at bedtime.     . Omega-3 Fatty Acids (FISH OIL) 1000 MG CAPS Take 1,000 mg by mouth daily.    Marland Kitchen omeprazole (PRILOSEC) 20 MG capsule Take 20 mg by mouth every morning.     . potassium chloride SA (K-DUR,KLOR-CON)  20 MEQ tablet Take 2 tablets (40 mEq total) by mouth daily. This is in addition to ur regular dose of 68mq daily (Patient taking differently: Take 20 mEq by mouth daily. ) 6 tablet 0  . triamcinolone ointment (KENALOG) 0.5 % Apply 1 application topically 2 (two) times daily. Rash on bilateral upper extremities 30 g 0  . diphenoxylate-atropine (LOMOTIL) 2.5-0.025 MG tablet Take 1 tablet by mouth 4 (four) times daily as needed for diarrhea or loose stools. (Patient not taking: Reported on 07/31/2017) 30 tablet 1  . lidocaine-prilocaine (EMLA) cream Apply 1 application as needed topically (for port access).    .Marland KitchenoxyCODONE (ROXICODONE) 5 MG immediate release tablet Take 1 tablet (5 mg total) by mouth every 4 (four) hours as needed for moderate pain or severe pain. (Patient not taking: Reported on 07/31/2017) 30 tablet 0  .  promethazine (PHENERGAN) 25 MG tablet TAKE 25 MG BY MOUTH EVERY 6-8 HOURS AS NEEDED FOR NAUSEA  2   No current facility-administered medications for this visit.    Facility-Administered Medications Ordered in Other Visits  Medication Dose Route Frequency Provider Last Rate Last Dose  . 0.9 %  sodium chloride infusion   Intravenous Once Lloyd Huger, MD      . 0.9 %  sodium chloride infusion   Intravenous Once Lloyd Huger, MD      . 0.9 %  sodium chloride infusion   Intravenous Once Lloyd Huger, MD      . 0.9 %  sodium chloride infusion   Intravenous Continuous Grayland Ormond, Kathlene November, MD      . ondansetron (ZOFRAN) 8 mg in sodium chloride 0.9 % 50 mL IVPB   Intravenous Once Lloyd Huger, MD        OBJECTIVE: Vitals:   07/31/17 0856  BP: (!) 167/88  Pulse: 76  Temp: 97.8 F (36.6 C)     Body mass index is 28.09 kg/m.    ECOG FS:0 - Asymptomatic  General: Well-developed, well-nourished, no acute distress. Eyes: Pink conjunctiva, anicteric sclera. Breast: Patient requested exam be deferred today. Lungs: Clear to auscultation bilaterally. Heart:  Regular rate and rhythm. No rubs, murmurs, or gallops. Abdomen: Soft, nontender, nondistended. No organomegaly noted, normoactive bowel sounds. Musculoskeletal: No edema, cyanosis, or clubbing. Neuro: Alert, answering all questions appropriately. Cranial nerves grossly intact. Skin: No rashes or petechiae noted. Psych: Normal affect.  LAB RESULTS:  Lab Results  Component Value Date   NA 140 07/31/2017   K 3.5 07/31/2017   CL 105 07/31/2017   CO2 25 07/31/2017   GLUCOSE 116 (H) 07/31/2017   BUN 19 07/31/2017   CREATININE 1.32 (H) 07/31/2017   CALCIUM 9.1 07/31/2017   PROT 6.8 07/31/2017   ALBUMIN 3.4 (L) 07/31/2017   AST 23 07/31/2017   ALT 8 (L) 07/31/2017   ALKPHOS 61 07/31/2017   BILITOT 0.6 07/31/2017   GFRNONAA 38 (L) 07/31/2017   GFRAA 44 (L) 07/31/2017    Lab Results  Component Value Date   WBC 10.0 07/31/2017   NEUTROABS 5.0 07/31/2017   HGB 11.5 (L) 07/31/2017   HCT 35.4 07/31/2017   MCV 83.6 07/31/2017   PLT 275 07/31/2017     STUDIES: Nm Cardiac Muga Rest  Result Date: 07/24/2017 CLINICAL DATA:  RIGHT breast cancer, cardiotoxic chemotherapy EXAM: NUCLEAR MEDICINE CARDIAC BLOOD POOL IMAGING (MUGA) TECHNIQUE: Cardiac multi-gated acquisition was performed at rest following intravenous injection of Tc-70mlabeled red blood cells. RADIOPHARMACEUTICALS:  22.876 mCi Tc-920mertechnetate in-vitro labeled red blood cells IV COMPARISON:  04/30/2017 FINDINGS: Calculated LEFT ventricular ejection fraction is 66%, unchanged from 67% on previous exam. Cine analysis of the LEFT ventricle in 3 projections demonstrates normal LEFT ventricular wall motion. IMPRESSION: Normal LEFT ventricular ejection fraction of 66% unchanged since 04/30/2017. No focal LEFT ventricular wall motion abnormalities. Electronically Signed   By: MaLavonia Dana.D.   On: 07/24/2017 08:14   Dg Bone Density  Result Date: 07/12/2017 EXAM: DUAL X-RAY ABSORPTIOMETRY (DXA) FOR BONE MINERAL DENSITY IMPRESSION:  Dear Dr. AlZenia ResidesYour patient PaCoraima Tibbsompleted a BMD test on 07/12/2017 using the LuTen Sleepanalysis version: 14.10) manufactured by GEEMCORThe following summarizes the results of our evaluation. PATIENT BIOGRAPHICAL: Name: BaJamaya, Sleethatient ID: [id number] Date: 02June 05, 1942eight: 62.0 in. Gender: Female Exam Date: 07/12/2017 Weight: 152.9 lbs. Indications: Caucasian, High  Risk Meds, History of Breast Cancer, History of Chemo, Hysterectomy, Postmenopausal Fractures: Treatments: Letrozole, Omeprazole, Vitamin D ASSESSMENT: The BMD measured at Femur Neck Right is 0.963 g/cm2 with a T-score of -0.5. This patient is considered normal according to [facility]) criteria. L-3 & 4 was excluded due to degenerative changes. Site Region Measured Measured WHO Young Adult BMD Date       Age      Classification T-score AP Spine L1-L2 07/12/2017 76.0 Normal 0.0 1.171 g/cm2 DualFemur Neck Right 07/12/2017 76.0 Normal -0.5 0.963 g/cm2 World Health Organization [facility]) criteria for post-menopausal, Caucasian Women: Normal:       T-score at or above -1 SD Osteopenia:   T-score between -1 and -2.5 SD Osteoporosis: T-score at or below -2.5 SD RECOMMENDATIONS: 1. All patients should optimize calcium and vitamin D intake. 2. Consider FDA-approved medical therapies in postmenopausal women and men aged 36 years and older, based on the following: a. A hip or vertebral(clinical or morphometric) fracture b. T-score < -2.5 at the femoral neck or spine after appropriate evaluation to exclude secondary causes c. Low bone mass (T-score between -1.0 and -2.5 at the femoral neck or spine) and a 10-year probability of a hip fracture > 3% or a 10-year probability of a major osteoporosis-related fracture > 20% based on the [place]-adapted WHO algorithm d. Clinician judgment and/or patient preferences may indicate treatment for people with 10-year fracture probabilities above or below these levels  FOLLOW-UP: People with diagnosed cases of osteoporosis or at high risk for fracture should have regular bone mineral density tests. For patients eligible for Medicare, routine testing is allowed once every 2 years. The testing frequency can be increased to one year for patients who have rapidly progressing disease, those who are receiving or discontinuing medical therapy to restore bone mass, or have additional risk factors. I have reviewed this report, and agree with the above findings. [facility] Radiology Electronically Signed   By: Lajean Manes M.D.   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  PLAN:    1. 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  Treatment was discontinued secondary to poor performance status.  Patient underwent her lumpectomy on March 21, 2017 which appears to be a complete pathologic response, but no biopsy clip was seen in her surgical specimen.  Her most recent surgery on May 21, 2017 to remove her retained clip, confirmed a complete pathological response.  MUGA scan from July 24, 2017 revealed an EF of 66% which remains unchanged from previous.  Repeat in July 2019.  Continue daily XRT completing on Sep 11, 2017.  Given the PR positivity of her tumor, patient was given a prescription for letrozole which she will take for a total of 5 years completing in February 2024.  Proceed with cycle 13 of 18 of maintenance Herceptin today.  Return to clinic in 3 weeks for consideration of cycle 14.  2. Renal insufficiency: Patient's creatinine has slightly increased to 1.32 today, monitor.  Encourage increased fluid intake. 3. Nausea: Patient does not complain of this today.  Continue current antiemetics as ordered. 4.  Hypokalemia: Resolved.  Continue 20 mEq oral potassium supplementation daily. 5. Hypomagnesemia: Resolved.  Continue oral supplementation.   6.  Incarcerated hernia: Resolved.  Patient expressed understanding and was in agreement with this plan. She also understands that She can call clinic at any time with any questions, concerns, or complaints.   Cancer Staging Malignant neoplasm of right female breast [facility]) Staging form: Breast, AJCC 8th Edition - Clinical stage from 10/16/2016: Stage IIB (cT2, cN1, cM0, G3, ER: Negative, PR: Positive, HER2: Positive) - Signed by Lloyd Huger, MD on 10/16/2016   Lloyd Huger, MD   08/03/2017 8:36 AM

## 2017-07-31 ENCOUNTER — Inpatient Hospital Stay (HOSPITAL_BASED_OUTPATIENT_CLINIC_OR_DEPARTMENT_OTHER): Payer: Medicare PPO | Admitting: Oncology

## 2017-07-31 ENCOUNTER — Inpatient Hospital Stay: Payer: Medicare PPO

## 2017-07-31 ENCOUNTER — Encounter: Payer: Self-pay | Admitting: Oncology

## 2017-07-31 ENCOUNTER — Inpatient Hospital Stay: Payer: Medicare PPO | Attending: Oncology

## 2017-07-31 ENCOUNTER — Other Ambulatory Visit: Payer: Self-pay

## 2017-07-31 VITALS — BP 167/88 | HR 76 | Temp 97.8°F | Wt 153.6 lb

## 2017-07-31 DIAGNOSIS — Z171 Estrogen receptor negative status [ER-]: Secondary | ICD-10-CM | POA: Diagnosis not present

## 2017-07-31 DIAGNOSIS — R531 Weakness: Secondary | ICD-10-CM

## 2017-07-31 DIAGNOSIS — Z5112 Encounter for antineoplastic immunotherapy: Secondary | ICD-10-CM | POA: Diagnosis not present

## 2017-07-31 DIAGNOSIS — C50919 Malignant neoplasm of unspecified site of unspecified female breast: Secondary | ICD-10-CM

## 2017-07-31 DIAGNOSIS — N289 Disorder of kidney and ureter, unspecified: Secondary | ICD-10-CM

## 2017-07-31 DIAGNOSIS — C50011 Malignant neoplasm of nipple and areola, right female breast: Secondary | ICD-10-CM

## 2017-07-31 DIAGNOSIS — C50411 Malignant neoplasm of upper-outer quadrant of right female breast: Secondary | ICD-10-CM | POA: Diagnosis not present

## 2017-07-31 LAB — COMPREHENSIVE METABOLIC PANEL
ALBUMIN: 3.4 g/dL — AB (ref 3.5–5.0)
ALT: 8 U/L — ABNORMAL LOW (ref 14–54)
ANION GAP: 10 (ref 5–15)
AST: 23 U/L (ref 15–41)
Alkaline Phosphatase: 61 U/L (ref 38–126)
BILIRUBIN TOTAL: 0.6 mg/dL (ref 0.3–1.2)
BUN: 19 mg/dL (ref 6–20)
CO2: 25 mmol/L (ref 22–32)
Calcium: 9.1 mg/dL (ref 8.9–10.3)
Chloride: 105 mmol/L (ref 101–111)
Creatinine, Ser: 1.32 mg/dL — ABNORMAL HIGH (ref 0.44–1.00)
GFR calc Af Amer: 44 mL/min — ABNORMAL LOW (ref >60)
GFR calc non Af Amer: 38 mL/min — ABNORMAL LOW (ref >60)
GLUCOSE: 116 mg/dL — AB (ref 65–99)
POTASSIUM: 3.5 mmol/L (ref 3.5–5.1)
SODIUM: 140 mmol/L (ref 135–145)
TOTAL PROTEIN: 6.8 g/dL (ref 6.5–8.1)

## 2017-07-31 LAB — CBC WITH DIFFERENTIAL/PLATELET
BASOS ABS: 0.1 10*3/uL (ref 0–0.1)
Basophils Relative: 1 %
EOS PCT: 5 %
Eosinophils Absolute: 0.5 10*3/uL (ref 0–0.7)
HEMATOCRIT: 35.4 % (ref 35.0–47.0)
Hemoglobin: 11.5 g/dL — ABNORMAL LOW (ref 12.0–16.0)
LYMPHS ABS: 3.3 10*3/uL (ref 1.0–3.6)
LYMPHS PCT: 33 %
MCH: 27 pg (ref 26.0–34.0)
MCHC: 32.4 g/dL (ref 32.0–36.0)
MCV: 83.6 fL (ref 80.0–100.0)
MONO ABS: 1.1 10*3/uL — AB (ref 0.2–0.9)
MONOS PCT: 11 %
NEUTROS ABS: 5 10*3/uL (ref 1.4–6.5)
Neutrophils Relative %: 50 %
Platelets: 275 10*3/uL (ref 150–440)
RBC: 4.24 MIL/uL (ref 3.80–5.20)
RDW: 18.4 % — AB (ref 11.5–14.5)
WBC: 10 10*3/uL (ref 3.6–11.0)

## 2017-07-31 LAB — MAGNESIUM: Magnesium: 1.8 mg/dL (ref 1.7–2.4)

## 2017-07-31 MED ORDER — HEPARIN SOD (PORK) LOCK FLUSH 100 UNIT/ML IV SOLN
500.0000 [IU] | Freq: Once | INTRAVENOUS | Status: AC | PRN
  Administered 2017-07-31: 500 [IU]
  Filled 2017-07-31: qty 5

## 2017-07-31 MED ORDER — SODIUM CHLORIDE 0.9 % IV SOLN
Freq: Once | INTRAVENOUS | Status: AC
  Administered 2017-07-31: 09:00:00 via INTRAVENOUS
  Filled 2017-07-31: qty 1000

## 2017-07-31 MED ORDER — DIPHENHYDRAMINE HCL 25 MG PO CAPS
25.0000 mg | ORAL_CAPSULE | Freq: Once | ORAL | Status: AC
  Administered 2017-07-31: 25 mg via ORAL
  Filled 2017-07-31: qty 1

## 2017-07-31 MED ORDER — TRASTUZUMAB CHEMO 150 MG IV SOLR
450.0000 mg | Freq: Once | INTRAVENOUS | Status: AC
  Administered 2017-07-31: 450 mg via INTRAVENOUS
  Filled 2017-07-31: qty 21.43

## 2017-07-31 MED ORDER — ACETAMINOPHEN 325 MG PO TABS
650.0000 mg | ORAL_TABLET | Freq: Once | ORAL | Status: AC
  Administered 2017-07-31: 650 mg via ORAL
  Filled 2017-07-31: qty 2

## 2017-08-02 DIAGNOSIS — Z510 Encounter for antineoplastic radiation therapy: Secondary | ICD-10-CM | POA: Diagnosis not present

## 2017-08-03 ENCOUNTER — Other Ambulatory Visit: Payer: Self-pay | Admitting: *Deleted

## 2017-08-03 DIAGNOSIS — Z171 Estrogen receptor negative status [ER-]: Principal | ICD-10-CM

## 2017-08-03 DIAGNOSIS — C50411 Malignant neoplasm of upper-outer quadrant of right female breast: Secondary | ICD-10-CM

## 2017-08-06 ENCOUNTER — Ambulatory Visit
Admission: RE | Admit: 2017-08-06 | Discharge: 2017-08-06 | Disposition: A | Discharge status: Home / Self Care | Payer: Medicare PPO | Source: Ambulatory Visit | Attending: Radiation Oncology | Admitting: Radiation Oncology

## 2017-08-06 DIAGNOSIS — Z510 Encounter for antineoplastic radiation therapy: Secondary | ICD-10-CM | POA: Diagnosis not present

## 2017-08-07 ENCOUNTER — Ambulatory Visit
Admission: RE | Admit: 2017-08-07 | Discharge: 2017-08-07 | Disposition: A | Discharge status: Home / Self Care | Payer: Medicare PPO | Source: Ambulatory Visit | Attending: Radiation Oncology | Admitting: Radiation Oncology

## 2017-08-07 DIAGNOSIS — Z510 Encounter for antineoplastic radiation therapy: Secondary | ICD-10-CM | POA: Diagnosis not present

## 2017-08-08 ENCOUNTER — Ambulatory Visit
Admission: RE | Admit: 2017-08-08 | Discharge: 2017-08-08 | Disposition: A | Discharge status: Home / Self Care | Payer: Medicare PPO | Source: Ambulatory Visit | Attending: Radiation Oncology | Admitting: Radiation Oncology

## 2017-08-08 DIAGNOSIS — Z510 Encounter for antineoplastic radiation therapy: Secondary | ICD-10-CM | POA: Diagnosis not present

## 2017-08-09 ENCOUNTER — Ambulatory Visit
Admission: RE | Admit: 2017-08-09 | Discharge: 2017-08-09 | Disposition: A | Discharge status: Home / Self Care | Payer: Medicare PPO | Source: Ambulatory Visit | Attending: Radiation Oncology | Admitting: Radiation Oncology

## 2017-08-09 DIAGNOSIS — Z510 Encounter for antineoplastic radiation therapy: Secondary | ICD-10-CM | POA: Diagnosis not present

## 2017-08-10 ENCOUNTER — Ambulatory Visit
Admission: RE | Admit: 2017-08-10 | Discharge: 2017-08-10 | Disposition: A | Discharge status: Home / Self Care | Payer: Medicare PPO | Source: Ambulatory Visit | Attending: Radiation Oncology | Admitting: Radiation Oncology

## 2017-08-10 DIAGNOSIS — Z510 Encounter for antineoplastic radiation therapy: Secondary | ICD-10-CM | POA: Diagnosis not present

## 2017-08-13 ENCOUNTER — Ambulatory Visit
Admission: RE | Admit: 2017-08-13 | Discharge: 2017-08-13 | Disposition: A | Discharge status: Home / Self Care | Payer: Medicare PPO | Source: Ambulatory Visit | Attending: Radiation Oncology | Admitting: Radiation Oncology

## 2017-08-13 DIAGNOSIS — Z510 Encounter for antineoplastic radiation therapy: Secondary | ICD-10-CM | POA: Diagnosis not present

## 2017-08-14 ENCOUNTER — Ambulatory Visit
Admission: RE | Admit: 2017-08-14 | Discharge: 2017-08-14 | Disposition: A | Discharge status: Home / Self Care | Payer: Medicare PPO | Source: Ambulatory Visit | Attending: Radiation Oncology | Admitting: Radiation Oncology

## 2017-08-14 DIAGNOSIS — Z510 Encounter for antineoplastic radiation therapy: Secondary | ICD-10-CM | POA: Diagnosis not present

## 2017-08-15 ENCOUNTER — Ambulatory Visit
Admission: RE | Admit: 2017-08-15 | Discharge: 2017-08-15 | Disposition: A | Discharge status: Home / Self Care | Payer: Medicare PPO | Source: Ambulatory Visit | Attending: Radiation Oncology | Admitting: Radiation Oncology

## 2017-08-15 DIAGNOSIS — Z510 Encounter for antineoplastic radiation therapy: Secondary | ICD-10-CM | POA: Diagnosis not present

## 2017-08-16 ENCOUNTER — Ambulatory Visit
Admission: RE | Admit: 2017-08-16 | Discharge: 2017-08-16 | Disposition: A | Discharge status: Home / Self Care | Payer: Medicare PPO | Source: Ambulatory Visit | Attending: Radiation Oncology | Admitting: Radiation Oncology

## 2017-08-16 DIAGNOSIS — Z510 Encounter for antineoplastic radiation therapy: Secondary | ICD-10-CM | POA: Diagnosis not present

## 2017-08-17 ENCOUNTER — Ambulatory Visit
Admission: RE | Admit: 2017-08-17 | Discharge: 2017-08-17 | Disposition: A | Discharge status: Home / Self Care | Payer: Medicare PPO | Source: Ambulatory Visit | Attending: Radiation Oncology | Admitting: Radiation Oncology

## 2017-08-17 DIAGNOSIS — Z510 Encounter for antineoplastic radiation therapy: Secondary | ICD-10-CM | POA: Diagnosis not present

## 2017-08-19 NOTE — Progress Notes (Signed)
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  INTERVAL HISTORY: Patient returns to clinic today for further evaluation and consideration of cycle 14 of maintenance Herceptin.  She continues to feel well and remains asymptomatic.  She is tolerating her treatments without significant side effects.  She does not complain of weakness or fatigue today.  She has no neurologic complaints. She denies any recent fevers or illnesses.  She has no chest pain, cough, or shortness of breath. She denies any nausea, vomiting, constipation, or diarrhea.  She has no abdominal pain.  She has no urinary complaints.  Patient feels at her baseline and offers no specific complaints today.  REVIEW OF SYSTEMS:   Review of Systems  Constitutional: Negative.  Negative for fever, malaise/fatigue and weight loss.  Respiratory: Negative.  Negative for cough and shortness of breath.   Cardiovascular: Negative.  Negative for chest pain and leg swelling.  Gastrointestinal: Negative.  Negative for abdominal pain, constipation, diarrhea and nausea.  Genitourinary: Negative.  Negative for dysuria.  Musculoskeletal: Negative.  Negative for joint pain.  Skin: Negative.  Negative for rash.  Neurological: Negative.  Negative for sensory change, focal weakness and weakness.  Psychiatric/Behavioral: Negative.  The patient is not nervous/anxious.     As per HPI. Otherwise, a complete review of systems is negative.  PAST MEDICAL HISTORY: Past Medical History:  Diagnosis Date  . Anxiety   . Arthritis   . Cancer [facility])    Right Breast Cancer  . COPD (chronic  obstructive pulmonary disease) ([place])   . Depression   . Dyspnea    with exertion  . GERD (gastroesophageal reflux disease)   . Hyperlipidemia   . Hypertension     PAST SURGICAL HISTORY: Past Surgical History:  Procedure Laterality Date  . ABDOMINAL HYSTERECTOMY  1990   Partial  . BREAST BIOPSY Right 10/04/2016   axilla lymph node and axillay tail mass biopsy. invasive mammary carcinoma  . BREAST LUMPECTOMY Right 03/21/2017  . BREAST LUMPECTOMY WITH NEEDLE LOCALIZATION Right 03/21/2017   Procedure: BREAST LUMPECTOMY WITH NEEDLE LOCALIZATION;  Surgeon: Clayburn Pert, MD;  Location: [facility] ORS;  Service: General;  Laterality: Right;  . DILATION AND CURETTAGE OF UTERUS    . INGUINAL HERNIA REPAIR Right 03/26/2017   Procedure: HERNIA REPAIR INGUINAL INCARCERATED;  Surgeon: Jules Husbands, MD;  Location: [facility] ORS;  Service: General;  Laterality: Right;  . PORTACATH PLACEMENT Left 10/24/2016   Procedure: INSERTION PORT-A-CATH;  Surgeon: [name], MD;  Location: [facility] ORS;  Service: General;  Laterality: Left;  . RE-EXCISION OF BREAST LUMPECTOMY Right 05/21/2017   Procedure: RE-EXCISION OF BREAST LUMPECTOMY;  Surgeon: Clayburn Pert, MD;  Location: [facility] ORS;  Service: General;  Laterality: Right;  . SENTINEL NODE BIOPSY Right 03/21/2017   Procedure: SENTINEL NODE BIOPSY;  Surgeon: Clayburn Pert, MD;  Location: [facility] ORS;  Service: General;  Laterality: Right;    FAMILY HISTORY: Family History  Problem Relation Age of Onset  . Colon cancer Mother   . Breast cancer Neg Hx     ADVANCED DIRECTIVES (Y/N):  N  HEALTH MAINTENANCE: Social History   Tobacco Use  . Smoking status: Former Smoker  Packs/day: 0.50    Types: Cigarettes    Last attempt to quit: 07/23/1988    Years since quitting: 29.1  . Smokeless tobacco: Never Used  Substance Use Topics  . Alcohol use: No  . Drug use: No     Colonoscopy:  PAP:  Bone density:  Lipid panel:  Allergies  Allergen Reactions  .  Penicillins Anaphylaxis, Swelling and Rash    Has patient had a PCN reaction causing immediate rash, facial/tongue/throat swelling, SOB or lightheadedness with hypotension: Yes Has patient had a PCN reaction causing severe rash involving mucus membranes or skin necrosis: No Has patient had a PCN reaction that required hospitalization: No  Has patient had a PCN reaction occurring within the last 10 years: No If all of the above answers are "NO", then may proceed with Cephalosporin use.   . Atorvastatin Other (See Comments)    Muscle Pain  . Gabapentin Swelling  . Ezetimibe Other (See Comments)    Muscle pain    Current Outpatient Medications  Medication Sig Dispense Refill  . ADVAIR DISKUS 500-50 MCG/DOSE AEPB Inhale 1 puff into the lungs 2 (two) times daily.     Marland Kitchen aspirin EC 81 MG tablet Take 81 mg by mouth daily.    . Aspirin-Caffeine (BAYER BACK & BODY PAIN EX ST PO) Take 2 tablets by mouth 2 (two) times daily as needed (for back pain).     . baclofen (LIORESAL) 10 MG tablet Take 10 mg by mouth at bedtime.  9  . co-enzyme Q-10 30 MG capsule Take 30 mg by mouth daily.    . DULoxetine (CYMBALTA) 30 MG capsule Take 30 mg by mouth every morning. Take with 60 mg to total 90 mg once daily  11  . DULoxetine (CYMBALTA) 60 MG capsule Take 60 mg by mouth every morning. Take with 30 mg to total 90 mg once daily  11  . KLOR-CON M20 20 MEQ tablet TAKE 1 TABLET BY MOUTH EVERY DAY 60 tablet 2  . letrozole (FEMARA) 2.5 MG tablet Take 1 tablet (2.5 mg total) by mouth daily. 90 tablet 3  . lidocaine-prilocaine (EMLA) cream Apply 1 application as needed topically (for port access).    Marland Kitchen losartan-hydrochlorothiazide (HYZAAR) 100-25 MG tablet Take 1 tablet by mouth daily.     . magnesium 30 MG tablet Take 30 mg by mouth 2 (two) times daily.    . Melatonin 5 MG TABS Take 10 mg by mouth at bedtime as needed (sleep).    . montelukast (SINGULAIR) 10 MG tablet Take 10 mg by mouth at bedtime.     . Omega-3  Fatty Acids (FISH OIL) 1000 MG CAPS Take 1,000 mg by mouth daily.    Marland Kitchen omeprazole (PRILOSEC) 20 MG capsule Take 20 mg by mouth every morning.     . potassium chloride SA (K-DUR,KLOR-CON) 20 MEQ tablet Take 2 tablets (40 mEq total) by mouth daily. This is in addition to ur regular dose of 72mq daily (Patient taking differently: Take 20 mEq by mouth daily. ) 6 tablet 0  . diphenoxylate-atropine (LOMOTIL) 2.5-0.025 MG tablet Take 1 tablet by mouth 4 (four) times daily as needed for diarrhea or loose stools. (Patient not taking: Reported on 07/31/2017) 30 tablet 1  . oxyCODONE (ROXICODONE) 5 MG immediate release tablet Take 1 tablet (5 mg total) by mouth every 4 (four) hours as needed for moderate pain or severe pain. (Patient not taking: Reported on 07/31/2017) 30 tablet 0  . promethazine (PHENERGAN) 25 MG  tablet TAKE 25 MG BY MOUTH EVERY 6-8 HOURS AS NEEDED FOR NAUSEA  2  . triamcinolone ointment (KENALOG) 0.5 % Apply 1 application topically 2 (two) times daily. Rash on bilateral upper extremities 30 g 0   No current facility-administered medications for this visit.    Facility-Administered Medications Ordered in Other Visits  Medication Dose Route Frequency Provider Last Rate Last Dose  . 0.9 %  sodium chloride infusion   Intravenous Once Lloyd Huger, MD      . 0.9 %  sodium chloride infusion   Intravenous Once Lloyd Huger, MD      . 0.9 %  sodium chloride infusion   Intravenous Once Lloyd Huger, MD      . 0.9 %  sodium chloride infusion   Intravenous Continuous Grayland Ormond, Kathlene November, MD      . ondansetron (ZOFRAN) 8 mg in sodium chloride 0.9 % 50 mL IVPB   Intravenous Once Lloyd Huger, MD        OBJECTIVE: Vitals:   08/21/17 0851  BP: (!) 189/92  Pulse: 80  Resp: 18  Temp: 97.6 F (36.4 C)     Body mass index is 27.97 kg/m.    ECOG FS:0 - Asymptomatic  General: Well-developed, well-nourished, no acute distress. Eyes: Pink conjunctiva, anicteric  sclera. Breast: Exam deferred today. Lungs: Clear to auscultation bilaterally. Heart: Regular rate and rhythm. No rubs, murmurs, or gallops. Abdomen: Soft, nontender, nondistended. No organomegaly noted, normoactive bowel sounds. Musculoskeletal: No edema, cyanosis, or clubbing. Neuro: Alert, answering all questions appropriately. Cranial nerves grossly intact. Skin: No rashes or petechiae noted. Psych: Normal affect.  LAB RESULTS:  Lab Results  Component Value Date   NA 141 08/21/2017   K 3.7 08/21/2017   CL 104 08/21/2017   CO2 27 08/21/2017   GLUCOSE 95 08/21/2017   BUN 23 (H) 08/21/2017   CREATININE 1.18 (H) 08/21/2017   CALCIUM 9.1 08/21/2017   PROT 7.1 08/21/2017   ALBUMIN 3.4 (L) 08/21/2017   AST 21 08/21/2017   ALT 11 (L) 08/21/2017   ALKPHOS 63 08/21/2017   BILITOT 0.5 08/21/2017   GFRNONAA 44 (L) 08/21/2017   GFRAA 51 (L) 08/21/2017    Lab Results  Component Value Date   WBC 8.0 08/21/2017   NEUTROABS 4.2 08/21/2017   HGB 11.6 (L) 08/21/2017   HCT 34.7 (L) 08/21/2017   MCV 84.1 08/21/2017   PLT 266 08/21/2017     STUDIES: Nm Cardiac Muga Rest  Result Date: 07/24/2017 CLINICAL DATA:  RIGHT breast cancer, cardiotoxic chemotherapy EXAM: NUCLEAR MEDICINE CARDIAC BLOOD POOL IMAGING (MUGA) TECHNIQUE: Cardiac multi-gated acquisition was performed at rest following intravenous injection of Tc-43mlabeled red blood cells. RADIOPHARMACEUTICALS:  22.876 mCi Tc-94mertechnetate in-vitro labeled red blood cells IV COMPARISON:  04/30/2017 FINDINGS: Calculated LEFT ventricular ejection fraction is 66%, unchanged from 67% on previous exam. Cine analysis of the LEFT ventricle in 3 projections demonstrates normal LEFT ventricular wall motion. IMPRESSION: Normal LEFT ventricular ejection fraction of 66% unchanged since 04/30/2017. No focal LEFT ventricular wall motion abnormalities. Electronically Signed   By: MaLavonia Dana.D.   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  PLAN:    1. 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  Treatment was discontinued secondary to poor performance status.  Patient underwent her lumpectomy on March 21, 2017 which appears to be a complete pathologic response, but no biopsy clip was seen in her surgical specimen.  Her most recent surgery on May 21, 2017 to remove her retained clip, confirmed a complete pathological response.  MUGA scan from July 24, 2017 revealed an EF of 66% which remains unchanged from previous.  Repeat in July 2019.  Continue daily XRT.  Given the PR positivity of her tumor, patient was given a prescription for letrozole which she will take for a total of 5 years completing in February 2024.  Proceed with cycle 14 of 18 of maintenance Herceptin today.  Return to clinic in 3 weeks for further evaluation and consideration of cycle 15. 2. Renal insufficiency: Patient's creatinine is trending back down.  Continue increased fluid intake.   3. Nausea: Patient does not complain of this today.  Continue current antiemetics as ordered. 4. Hypokalemia: Resolved.  Continue 20 mEq oral potassium supplementation daily. 5. Hypomagnesemia: Resolved.  Continue oral supplementation.   6.  Incarcerated hernia: Resolved.  Approximately 30 minutes was spent in discussion of which greater than 50% was consultation.   Patient expressed understanding and was in agreement with this plan. She also understands that She can call clinic at any time with any questions, concerns, or complaints.   Cancer Staging Malignant neoplasm of right female breast [facility]) Staging form: Breast, AJCC 8th Edition - Clinical stage from 10/16/2016: Stage IIB (cT2, cN1, cM0, G3, ER: Negative, PR:  Positive, HER2: Positive) - Signed by Lloyd Huger, MD on 10/16/2016   Lloyd Huger, MD   08/22/2017 3:03 PM

## 2017-08-20 ENCOUNTER — Ambulatory Visit
Admission: RE | Admit: 2017-08-20 | Discharge: 2017-08-20 | Disposition: A | Discharge status: Home / Self Care | Payer: Medicare PPO | Source: Ambulatory Visit | Attending: Radiation Oncology | Admitting: Radiation Oncology

## 2017-08-20 DIAGNOSIS — Z510 Encounter for antineoplastic radiation therapy: Secondary | ICD-10-CM | POA: Diagnosis not present

## 2017-08-21 ENCOUNTER — Inpatient Hospital Stay: Payer: Medicare PPO

## 2017-08-21 ENCOUNTER — Ambulatory Visit
Admission: RE | Admit: 2017-08-21 | Discharge: 2017-08-21 | Disposition: A | Discharge status: Home / Self Care | Payer: Medicare PPO | Source: Ambulatory Visit | Attending: Radiation Oncology | Admitting: Radiation Oncology

## 2017-08-21 ENCOUNTER — Encounter: Payer: Self-pay | Admitting: Oncology

## 2017-08-21 ENCOUNTER — Inpatient Hospital Stay (HOSPITAL_BASED_OUTPATIENT_CLINIC_OR_DEPARTMENT_OTHER): Payer: Medicare PPO | Admitting: Oncology

## 2017-08-21 VITALS — BP 189/92 | HR 80 | Temp 97.6°F | Resp 18 | Wt 152.9 lb

## 2017-08-21 DIAGNOSIS — Z171 Estrogen receptor negative status [ER-]: Secondary | ICD-10-CM | POA: Diagnosis not present

## 2017-08-21 DIAGNOSIS — C50919 Malignant neoplasm of unspecified site of unspecified female breast: Secondary | ICD-10-CM

## 2017-08-21 DIAGNOSIS — Z5112 Encounter for antineoplastic immunotherapy: Secondary | ICD-10-CM | POA: Diagnosis not present

## 2017-08-21 DIAGNOSIS — C50411 Malignant neoplasm of upper-outer quadrant of right female breast: Secondary | ICD-10-CM

## 2017-08-21 DIAGNOSIS — C50011 Malignant neoplasm of nipple and areola, right female breast: Secondary | ICD-10-CM

## 2017-08-21 DIAGNOSIS — Z510 Encounter for antineoplastic radiation therapy: Secondary | ICD-10-CM | POA: Diagnosis not present

## 2017-08-21 LAB — COMPREHENSIVE METABOLIC PANEL
ALK PHOS: 63 U/L (ref 38–126)
ALT: 11 U/L — AB (ref 14–54)
ANION GAP: 10 (ref 5–15)
AST: 21 U/L (ref 15–41)
Albumin: 3.4 g/dL — ABNORMAL LOW (ref 3.5–5.0)
BUN: 23 mg/dL — ABNORMAL HIGH (ref 6–20)
CALCIUM: 9.1 mg/dL (ref 8.9–10.3)
CO2: 27 mmol/L (ref 22–32)
CREATININE: 1.18 mg/dL — AB (ref 0.44–1.00)
Chloride: 104 mmol/L (ref 101–111)
GFR, EST AFRICAN AMERICAN: 51 mL/min — AB (ref >60)
GFR, EST NON AFRICAN AMERICAN: 44 mL/min — AB (ref >60)
Glucose, Bld: 95 mg/dL (ref 65–99)
Potassium: 3.7 mmol/L (ref 3.5–5.1)
SODIUM: 141 mmol/L (ref 135–145)
TOTAL PROTEIN: 7.1 g/dL (ref 6.5–8.1)
Total Bilirubin: 0.5 mg/dL (ref 0.3–1.2)

## 2017-08-21 LAB — CBC WITH DIFFERENTIAL/PLATELET
BASOS ABS: 0.1 10*3/uL (ref 0–0.1)
BASOS PCT: 1 %
EOS ABS: 0.5 10*3/uL (ref 0–0.7)
Eosinophils Relative: 6 %
HEMATOCRIT: 34.7 % — AB (ref 35.0–47.0)
HEMOGLOBIN: 11.6 g/dL — AB (ref 12.0–16.0)
Lymphocytes Relative: 31 %
Lymphs Abs: 2.5 10*3/uL (ref 1.0–3.6)
MCH: 28.2 pg (ref 26.0–34.0)
MCHC: 33.5 g/dL (ref 32.0–36.0)
MCV: 84.1 fL (ref 80.0–100.0)
Monocytes Absolute: 0.8 10*3/uL (ref 0.2–0.9)
Monocytes Relative: 10 %
NEUTROS ABS: 4.2 10*3/uL (ref 1.4–6.5)
NEUTROS PCT: 52 %
Platelets: 266 10*3/uL (ref 150–440)
RBC: 4.12 MIL/uL (ref 3.80–5.20)
RDW: 19.4 % — ABNORMAL HIGH (ref 11.5–14.5)
WBC: 8 10*3/uL (ref 3.6–11.0)

## 2017-08-21 LAB — MAGNESIUM: Magnesium: 1.9 mg/dL (ref 1.7–2.4)

## 2017-08-21 MED ORDER — TRASTUZUMAB CHEMO 150 MG IV SOLR
450.0000 mg | Freq: Once | INTRAVENOUS | Status: AC
  Administered 2017-08-21: 450 mg via INTRAVENOUS
  Filled 2017-08-21: qty 21.43

## 2017-08-21 MED ORDER — SODIUM CHLORIDE 0.9% FLUSH
10.0000 mL | Freq: Once | INTRAVENOUS | Status: AC
  Administered 2017-08-21: 10 mL via INTRAVENOUS
  Filled 2017-08-21: qty 10

## 2017-08-21 MED ORDER — SODIUM CHLORIDE 0.9 % IV SOLN
Freq: Once | INTRAVENOUS | Status: AC
  Administered 2017-08-21: 10:00:00 via INTRAVENOUS
  Filled 2017-08-21: qty 1000

## 2017-08-21 MED ORDER — DIPHENHYDRAMINE HCL 25 MG PO CAPS
25.0000 mg | ORAL_CAPSULE | Freq: Once | ORAL | Status: AC
  Administered 2017-08-21: 25 mg via ORAL
  Filled 2017-08-21: qty 1

## 2017-08-21 MED ORDER — ACETAMINOPHEN 325 MG PO TABS
650.0000 mg | ORAL_TABLET | Freq: Once | ORAL | Status: AC
  Administered 2017-08-21: 650 mg via ORAL
  Filled 2017-08-21: qty 2

## 2017-08-21 MED ORDER — HEPARIN SOD (PORK) LOCK FLUSH 100 UNIT/ML IV SOLN
500.0000 [IU] | Freq: Once | INTRAVENOUS | Status: AC
  Administered 2017-08-21: 500 [IU] via INTRAVENOUS
  Filled 2017-08-21: qty 5

## 2017-08-21 NOTE — Progress Notes (Signed)
Patient denies any concerns today.  

## 2017-08-22 ENCOUNTER — Inpatient Hospital Stay: Payer: Medicare PPO

## 2017-08-22 ENCOUNTER — Ambulatory Visit
Admission: RE | Admit: 2017-08-22 | Discharge: 2017-08-22 | Disposition: A | Discharge status: Home / Self Care | Payer: Medicare PPO | Source: Ambulatory Visit | Attending: Radiation Oncology | Admitting: Radiation Oncology

## 2017-08-22 DIAGNOSIS — Z170 Estrogen receptor positive status [ER+]: Secondary | ICD-10-CM | POA: Insufficient documentation

## 2017-08-22 DIAGNOSIS — Z510 Encounter for antineoplastic radiation therapy: Secondary | ICD-10-CM | POA: Diagnosis not present

## 2017-08-22 DIAGNOSIS — C773 Secondary and unspecified malignant neoplasm of axilla and upper limb lymph nodes: Secondary | ICD-10-CM | POA: Insufficient documentation

## 2017-08-22 DIAGNOSIS — C50411 Malignant neoplasm of upper-outer quadrant of right female breast: Secondary | ICD-10-CM | POA: Diagnosis not present

## 2017-08-23 ENCOUNTER — Ambulatory Visit
Admission: RE | Admit: 2017-08-23 | Discharge: 2017-08-23 | Disposition: A | Discharge status: Home / Self Care | Payer: Medicare PPO | Source: Ambulatory Visit | Attending: Radiation Oncology | Admitting: Radiation Oncology

## 2017-08-23 DIAGNOSIS — Z510 Encounter for antineoplastic radiation therapy: Secondary | ICD-10-CM | POA: Diagnosis not present

## 2017-08-24 ENCOUNTER — Ambulatory Visit
Admission: RE | Admit: 2017-08-24 | Discharge: 2017-08-24 | Disposition: A | Discharge status: Home / Self Care | Payer: Medicare PPO | Source: Ambulatory Visit | Attending: Radiation Oncology | Admitting: Radiation Oncology

## 2017-08-24 DIAGNOSIS — Z510 Encounter for antineoplastic radiation therapy: Secondary | ICD-10-CM | POA: Diagnosis not present

## 2017-08-27 ENCOUNTER — Ambulatory Visit
Admission: RE | Admit: 2017-08-27 | Discharge: 2017-08-27 | Disposition: A | Discharge status: Home / Self Care | Payer: Medicare PPO | Source: Ambulatory Visit | Attending: Radiation Oncology | Admitting: Radiation Oncology

## 2017-08-27 DIAGNOSIS — Z510 Encounter for antineoplastic radiation therapy: Secondary | ICD-10-CM | POA: Diagnosis not present

## 2017-08-28 ENCOUNTER — Ambulatory Visit
Admission: RE | Admit: 2017-08-28 | Discharge: 2017-08-28 | Disposition: A | Discharge status: Home / Self Care | Payer: Medicare PPO | Source: Ambulatory Visit | Attending: Radiation Oncology | Admitting: Radiation Oncology

## 2017-08-28 DIAGNOSIS — Z510 Encounter for antineoplastic radiation therapy: Secondary | ICD-10-CM | POA: Diagnosis not present

## 2017-08-29 ENCOUNTER — Ambulatory Visit
Admission: RE | Admit: 2017-08-29 | Discharge: 2017-08-29 | Disposition: A | Discharge status: Home / Self Care | Payer: Medicare PPO | Source: Ambulatory Visit | Attending: Radiation Oncology | Admitting: Radiation Oncology

## 2017-08-29 DIAGNOSIS — Z510 Encounter for antineoplastic radiation therapy: Secondary | ICD-10-CM | POA: Diagnosis not present

## 2017-08-30 ENCOUNTER — Ambulatory Visit
Admission: RE | Admit: 2017-08-30 | Discharge: 2017-08-30 | Disposition: A | Discharge status: Home / Self Care | Payer: Medicare PPO | Source: Ambulatory Visit | Attending: Radiation Oncology | Admitting: Radiation Oncology

## 2017-08-30 DIAGNOSIS — Z510 Encounter for antineoplastic radiation therapy: Secondary | ICD-10-CM | POA: Diagnosis not present

## 2017-08-31 ENCOUNTER — Ambulatory Visit
Admission: RE | Admit: 2017-08-31 | Discharge: 2017-08-31 | Disposition: A | Discharge status: Home / Self Care | Payer: Medicare PPO | Source: Ambulatory Visit | Attending: Radiation Oncology | Admitting: Radiation Oncology

## 2017-08-31 DIAGNOSIS — Z510 Encounter for antineoplastic radiation therapy: Secondary | ICD-10-CM | POA: Diagnosis not present

## 2017-09-03 ENCOUNTER — Ambulatory Visit
Admission: RE | Admit: 2017-09-03 | Discharge: 2017-09-03 | Disposition: A | Discharge status: Home / Self Care | Payer: Medicare PPO | Source: Ambulatory Visit | Attending: Radiation Oncology | Admitting: Radiation Oncology

## 2017-09-03 DIAGNOSIS — Z510 Encounter for antineoplastic radiation therapy: Secondary | ICD-10-CM | POA: Diagnosis not present

## 2017-09-04 ENCOUNTER — Ambulatory Visit
Admission: RE | Admit: 2017-09-04 | Discharge: 2017-09-04 | Disposition: A | Discharge status: Home / Self Care | Payer: Medicare PPO | Source: Ambulatory Visit | Attending: Radiation Oncology | Admitting: Radiation Oncology

## 2017-09-04 DIAGNOSIS — Z510 Encounter for antineoplastic radiation therapy: Secondary | ICD-10-CM | POA: Diagnosis not present

## 2017-09-05 ENCOUNTER — Inpatient Hospital Stay: Payer: Medicare PPO

## 2017-09-05 ENCOUNTER — Ambulatory Visit
Admission: RE | Admit: 2017-09-05 | Discharge: 2017-09-05 | Disposition: A | Discharge status: Home / Self Care | Payer: Medicare PPO | Source: Ambulatory Visit | Attending: Radiation Oncology | Admitting: Radiation Oncology

## 2017-09-05 DIAGNOSIS — Z510 Encounter for antineoplastic radiation therapy: Secondary | ICD-10-CM | POA: Diagnosis not present

## 2017-09-06 ENCOUNTER — Ambulatory Visit
Admission: RE | Admit: 2017-09-06 | Discharge: 2017-09-06 | Disposition: A | Discharge status: Home / Self Care | Payer: Medicare PPO | Source: Ambulatory Visit | Attending: Radiation Oncology | Admitting: Radiation Oncology

## 2017-09-06 DIAGNOSIS — Z510 Encounter for antineoplastic radiation therapy: Secondary | ICD-10-CM | POA: Diagnosis not present

## 2017-09-07 ENCOUNTER — Ambulatory Visit: Payer: Medicare PPO

## 2017-09-09 NOTE — Progress Notes (Signed)
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  INTERVAL HISTORY: Patient returns to clinic today for further evaluation and consideration of cycle 15 of maintenance Herceptin.  She currently feels well and is asymptomatic.  She is tolerating her daily XRT well without significant side effects.  She does not complain of weakness or fatigue today.  She has no neurologic complaints. She denies any recent fevers or illnesses.  She has no chest pain, cough, or shortness of breath. She denies any nausea, vomiting, constipation, or diarrhea.  She has no abdominal pain.  She has no urinary complaints.  Patient offers no specific complaints today.  REVIEW OF SYSTEMS:   Review of Systems  Constitutional: Negative.  Negative for fever, malaise/fatigue and weight loss.  Respiratory: Negative.  Negative for cough and shortness of breath.   Cardiovascular: Negative.  Negative for chest pain and leg swelling.  Gastrointestinal: Negative.  Negative for abdominal pain, constipation, diarrhea and nausea.  Genitourinary: Negative.  Negative for dysuria.  Musculoskeletal: Negative.  Negative for joint pain.  Skin: Negative.  Negative for rash.  Neurological: Negative.  Negative for sensory change, focal weakness and weakness.  Psychiatric/Behavioral: Negative.  The patient is not nervous/anxious.     As per HPI. Otherwise, a complete review of systems is negative.  PAST MEDICAL HISTORY: Past Medical History:  Diagnosis Date  . Anxiety   . Arthritis   . Cancer ([facility])    Right Breast Cancer  . COPD (chronic obstructive pulmonary  disease) ([facility])   . Depression   . Dyspnea    with exertion  . GERD (gastroesophageal reflux disease)   . Hyperlipidemia   . Hypertension     PAST SURGICAL HISTORY: Past Surgical History:  Procedure Laterality Date  . ABDOMINAL HYSTERECTOMY  1990   Partial  . BREAST BIOPSY Right 10/04/2016   axilla lymph node and axillay tail mass biopsy. invasive mammary carcinoma  . BREAST LUMPECTOMY Right 03/21/2017  . BREAST LUMPECTOMY WITH NEEDLE LOCALIZATION Right 03/21/2017   Procedure: BREAST LUMPECTOMY WITH NEEDLE LOCALIZATION;  Surgeon: Woodham, Charles, MD;  Location: [facility] ORS;  Service: General;  Laterality: Right;  . DILATION AND CURETTAGE OF UTERUS    . INGUINAL HERNIA REPAIR Right 03/26/2017   Procedure: HERNIA REPAIR INGUINAL INCARCERATED;  Surgeon: Pabon, Diego F, MD;  Location: [facility] ORS;  Service: General;  Laterality: Right;  . PORTACATH PLACEMENT Left 10/24/2016   Procedure: INSERTION PORT-A-CATH;  Surgeon: Oaks, , MD;  Location: [facility] ORS;  Service: General;  Laterality: Left;  . RE-EXCISION OF BREAST LUMPECTOMY Right 05/21/2017   Procedure: RE-EXCISION OF BREAST LUMPECTOMY;  Surgeon: Woodham, Charles, MD;  Location: [facility] ORS;  Service: General;  Laterality: Right;  . SENTINEL NODE BIOPSY Right 03/21/2017   Procedure: SENTINEL NODE BIOPSY;  Surgeon: Woodham, Charles, MD;  Location: [facility] ORS;  Service: General;  Laterality: Right;    FAMILY HISTORY: Family History  Problem Relation Age of Onset  . Colon cancer Mother   . Breast cancer Neg Hx     ADVANCED DIRECTIVES (Y/N):  N  HEALTH MAINTENANCE: Social History   Tobacco Use  . Smoking status: Former Smoker    Packs/day: 0.50      Types: Cigarettes    Last attempt to quit: 07/23/1988    Years since quitting: 29.1  . Smokeless tobacco: Never Used  Substance Use Topics  . Alcohol use: No  . Drug use: No     Colonoscopy:  PAP:  Bone density:  Lipid panel:  Allergies  Allergen Reactions  . Penicillins  Anaphylaxis, Swelling and Rash    Has patient had a PCN reaction causing immediate rash, facial/tongue/throat swelling, SOB or lightheadedness with hypotension: Yes Has patient had a PCN reaction causing severe rash involving mucus membranes or skin necrosis: No Has patient had a PCN reaction that required hospitalization: No  Has patient had a PCN reaction occurring within the last 10 years: No If all of the above answers are "NO", then may proceed with Cephalosporin use.   . Atorvastatin Other (See Comments)    Muscle Pain  . Gabapentin Swelling  . Ezetimibe Other (See Comments)    Muscle pain    Current Outpatient Medications  Medication Sig Dispense Refill  . ADVAIR DISKUS 500-50 MCG/DOSE AEPB Inhale 1 puff into the lungs 2 (two) times daily.     . aspirin EC 81 MG tablet Take 81 mg by mouth daily.    . Aspirin-Caffeine (BAYER BACK & BODY PAIN EX ST PO) Take 2 tablets by mouth 2 (two) times daily as needed (for back pain).     . baclofen (LIORESAL) 10 MG tablet Take 10 mg by mouth at bedtime.  9  . co-enzyme Q-10 30 MG capsule Take 30 mg by mouth daily.    . DULoxetine (CYMBALTA) 30 MG capsule Take 30 mg by mouth every morning. Take with 60 mg to total 90 mg once daily  11  . KLOR-CON M20 20 MEQ tablet TAKE 1 TABLET BY MOUTH EVERY DAY 60 tablet 2  . letrozole (FEMARA) 2.5 MG tablet Take 1 tablet (2.5 mg total) by mouth daily. 90 tablet 3  . lidocaine-prilocaine (EMLA) cream Apply 1 application as needed topically (for port access).    . losartan-hydrochlorothiazide (HYZAAR) 100-25 MG tablet Take 1 tablet by mouth daily.     . magnesium 30 MG tablet Take 30 mg by mouth 2 (two) times daily.    . Melatonin 5 MG TABS Take 10 mg by mouth at bedtime as needed (sleep).    . montelukast (SINGULAIR) 10 MG tablet Take 10 mg by mouth at bedtime.     . Omega-3 Fatty Acids (FISH OIL) 1000 MG CAPS Take 1,000 mg by mouth daily.    . omeprazole (PRILOSEC) 20 MG capsule Take 20 mg by mouth every  morning.     . potassium chloride SA (K-DUR,KLOR-CON) 20 MEQ tablet Take 2 tablets (40 mEq total) by mouth daily. This is in addition to ur regular dose of 20meq daily (Patient taking differently: Take 20 mEq by mouth daily. ) 6 tablet 0  . diphenoxylate-atropine (LOMOTIL) 2.5-0.025 MG tablet Take 1 tablet by mouth 4 (four) times daily as needed for diarrhea or loose stools. (Patient not taking: Reported on 07/31/2017) 30 tablet 1  . DULoxetine (CYMBALTA) 60 MG capsule Take 60 mg by mouth every morning. Take with 30 mg to total 90 mg once daily  11  . oxyCODONE (ROXICODONE) 5 MG immediate release tablet Take 1 tablet (5 mg total) by mouth every 4 (four) hours as needed for moderate pain or severe pain. (Patient not taking: Reported on 07/31/2017) 30 tablet 0  . promethazine (PHENERGAN) 25 MG tablet TAKE 25 MG BY   MOUTH EVERY 6-8 HOURS AS NEEDED FOR NAUSEA  2  . triamcinolone ointment (KENALOG) 0.5 % Apply 1 application topically 2 (two) times daily. Rash on bilateral upper extremities (Patient not taking: Reported on 09/11/2017) 30 g 0   No current facility-administered medications for this visit.    Facility-Administered Medications Ordered in Other Visits  Medication Dose Route Frequency Provider Last Rate Last Dose  . 0.9 %  sodium chloride infusion   Intravenous Once Lloyd Huger, MD      . 0.9 %  sodium chloride infusion   Intravenous Once Lloyd Huger, MD      . 0.9 %  sodium chloride infusion   Intravenous Once Lloyd Huger, MD      . 0.9 %  sodium chloride infusion   Intravenous Continuous Grayland Ormond, Kathlene November, MD      . ondansetron (ZOFRAN) 8 mg in sodium chloride 0.9 % 50 mL IVPB   Intravenous Once Lloyd Huger, MD        OBJECTIVE: Vitals:   09/11/17 0910  BP: (!) 166/91  Pulse: 73  Resp: 18  Temp: (!) 97.3 F (36.3 C)     Body mass index is 27.8 kg/m.    ECOG FS:0 - Asymptomatic  General: Well-developed, well-nourished, no acute distress. Eyes: Pink  conjunctiva, anicteric sclera. Exam deferred today. Lungs: Clear to auscultation bilaterally. Heart: Regular rate and rhythm. No rubs, murmurs, or gallops. Abdomen: Soft, nontender, nondistended. No organomegaly noted, normoactive bowel sounds. Musculoskeletal: No edema, cyanosis, or clubbing. Neuro: Alert, answering all questions appropriately. Cranial nerves grossly intact. Skin: No rashes or petechiae noted. Psych: Normal affect.  LAB RESULTS:  Lab Results  Component Value Date   NA 141 09/11/2017   K 3.5 09/11/2017   CL 105 09/11/2017   CO2 25 09/11/2017   GLUCOSE 104 (H) 09/11/2017   BUN 21 (H) 09/11/2017   CREATININE 1.07 (H) 09/11/2017   CALCIUM 9.1 09/11/2017   PROT 6.8 09/11/2017   ALBUMIN 3.3 (L) 09/11/2017   AST 19 09/11/2017   ALT 10 (L) 09/11/2017   ALKPHOS 56 09/11/2017   BILITOT 0.6 09/11/2017   GFRNONAA 49 (L) 09/11/2017   GFRAA 57 (L) 09/11/2017    Lab Results  Component Value Date   WBC 6.8 09/11/2017   NEUTROABS 4.2 09/11/2017   HGB 11.5 (L) 09/11/2017   HCT 34.8 (L) 09/11/2017   MCV 85.4 09/11/2017   PLT 252 09/11/2017     STUDIES: No results found.  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  PLAN:    1. 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  Treatment was discontinued secondary to poor performance status.  Patient underwent her lumpectomy on March 21, 2017, but no biopsy clip was seen in her surgical specimen.  Her most recent surgery on May 21, 2017 to remove her retained clip, confirmed a complete pathological response.  MUGA scan from July 24, 2017 revealed an EF of 66% which remains unchanged from previous.  Repeat in July 2019.  Continue daily XRT completing on September 27, 2017.  Given the PR  positivity of her tumor, patient was given a prescription for letrozole which she will take for a total of 5 years completing in February 2024.  Proceed with cycle 15 of 18  of maintenance Herceptin today.  Return to clinic in 3 weeks for further evaluation and consideration of cycle 19. 2. Renal insufficiency: Resolved..   3. Hypokalemia: Resolved.  Continue 20 mEq oral potassium supplementation daily. 4. Hypomagnesemia: Resolved.  Continue oral supplementation.   5.  Incarcerated hernia: Resolved.  Approximately 30 minutes was spent in discussion of which greater than 50% was consultation.   Patient expressed understanding and was in agreement with this plan. She also understands that She can call clinic at any time with any questions, concerns, or complaints.   Cancer Staging Malignant neoplasm of right female breast ([facility]) Staging form: Breast, AJCC 8th Edition - Clinical stage from 10/16/2016: Stage IIB (cT2, cN1, cM0, G3, ER: Negative, PR: Positive, HER2: Positive) - Signed by ,  J, MD on 10/16/2016    J , MD   09/12/2017 2:37 PM     

## 2017-09-10 ENCOUNTER — Ambulatory Visit
Admission: RE | Admit: 2017-09-10 | Discharge: 2017-09-10 | Disposition: A | Discharge status: Home / Self Care | Payer: Medicare PPO | Source: Ambulatory Visit | Attending: Radiation Oncology | Admitting: Radiation Oncology

## 2017-09-10 DIAGNOSIS — Z510 Encounter for antineoplastic radiation therapy: Secondary | ICD-10-CM | POA: Diagnosis not present

## 2017-09-11 ENCOUNTER — Inpatient Hospital Stay: Payer: Medicare PPO

## 2017-09-11 ENCOUNTER — Other Ambulatory Visit: Payer: Self-pay

## 2017-09-11 ENCOUNTER — Ambulatory Visit
Admission: RE | Admit: 2017-09-11 | Discharge: 2017-09-11 | Disposition: A | Discharge status: Home / Self Care | Payer: Medicare PPO | Source: Ambulatory Visit | Attending: Radiation Oncology | Admitting: Radiation Oncology

## 2017-09-11 ENCOUNTER — Encounter: Payer: Self-pay | Admitting: Oncology

## 2017-09-11 ENCOUNTER — Inpatient Hospital Stay: Payer: Medicare PPO | Attending: Oncology

## 2017-09-11 ENCOUNTER — Inpatient Hospital Stay (HOSPITAL_BASED_OUTPATIENT_CLINIC_OR_DEPARTMENT_OTHER): Payer: Medicare PPO | Admitting: Oncology

## 2017-09-11 VITALS — BP 166/91 | HR 73 | Temp 97.3°F | Resp 18 | Wt 152.0 lb

## 2017-09-11 DIAGNOSIS — C50919 Malignant neoplasm of unspecified site of unspecified female breast: Secondary | ICD-10-CM

## 2017-09-11 DIAGNOSIS — I10 Essential (primary) hypertension: Secondary | ICD-10-CM | POA: Insufficient documentation

## 2017-09-11 DIAGNOSIS — C50011 Malignant neoplasm of nipple and areola, right female breast: Secondary | ICD-10-CM

## 2017-09-11 DIAGNOSIS — C50411 Malignant neoplasm of upper-outer quadrant of right female breast: Secondary | ICD-10-CM | POA: Diagnosis not present

## 2017-09-11 DIAGNOSIS — Z171 Estrogen receptor negative status [ER-]: Secondary | ICD-10-CM | POA: Insufficient documentation

## 2017-09-11 DIAGNOSIS — Z5112 Encounter for antineoplastic immunotherapy: Secondary | ICD-10-CM | POA: Insufficient documentation

## 2017-09-11 DIAGNOSIS — Z510 Encounter for antineoplastic radiation therapy: Secondary | ICD-10-CM | POA: Diagnosis not present

## 2017-09-11 LAB — CBC WITH DIFFERENTIAL/PLATELET
BASOS PCT: 1 %
Basophils Absolute: 0.1 10*3/uL (ref 0–0.1)
EOS ABS: 0.4 10*3/uL (ref 0–0.7)
EOS PCT: 6 %
HEMATOCRIT: 34.8 % — AB (ref 35.0–47.0)
Hemoglobin: 11.5 g/dL — ABNORMAL LOW (ref 12.0–16.0)
Lymphocytes Relative: 20 %
Lymphs Abs: 1.4 10*3/uL (ref 1.0–3.6)
MCH: 28.2 pg (ref 26.0–34.0)
MCHC: 33 g/dL (ref 32.0–36.0)
MCV: 85.4 fL (ref 80.0–100.0)
MONO ABS: 0.8 10*3/uL (ref 0.2–0.9)
Monocytes Relative: 12 %
NEUTROS PCT: 61 %
Neutro Abs: 4.2 10*3/uL (ref 1.4–6.5)
Platelets: 252 10*3/uL (ref 150–440)
RBC: 4.07 MIL/uL (ref 3.80–5.20)
RDW: 17.8 % — ABNORMAL HIGH (ref 11.5–14.5)
WBC: 6.8 10*3/uL (ref 3.6–11.0)

## 2017-09-11 LAB — COMPREHENSIVE METABOLIC PANEL
ALK PHOS: 56 U/L (ref 38–126)
ALT: 10 U/L — AB (ref 14–54)
AST: 19 U/L (ref 15–41)
Albumin: 3.3 g/dL — ABNORMAL LOW (ref 3.5–5.0)
Anion gap: 11 (ref 5–15)
BUN: 21 mg/dL — AB (ref 6–20)
CALCIUM: 9.1 mg/dL (ref 8.9–10.3)
CO2: 25 mmol/L (ref 22–32)
CREATININE: 1.07 mg/dL — AB (ref 0.44–1.00)
Chloride: 105 mmol/L (ref 101–111)
GFR calc non Af Amer: 49 mL/min — ABNORMAL LOW (ref >60)
GFR, EST AFRICAN AMERICAN: 57 mL/min — AB (ref >60)
GLUCOSE: 104 mg/dL — AB (ref 65–99)
Potassium: 3.5 mmol/L (ref 3.5–5.1)
SODIUM: 141 mmol/L (ref 135–145)
Total Bilirubin: 0.6 mg/dL (ref 0.3–1.2)
Total Protein: 6.8 g/dL (ref 6.5–8.1)

## 2017-09-11 LAB — MAGNESIUM: Magnesium: 1.9 mg/dL (ref 1.7–2.4)

## 2017-09-11 MED ORDER — SODIUM CHLORIDE 0.9 % IV SOLN
Freq: Once | INTRAVENOUS | Status: AC
  Administered 2017-09-11: 10:00:00 via INTRAVENOUS
  Filled 2017-09-11: qty 1000

## 2017-09-11 MED ORDER — DIPHENHYDRAMINE HCL 25 MG PO CAPS
25.0000 mg | ORAL_CAPSULE | Freq: Once | ORAL | Status: AC
  Administered 2017-09-11: 25 mg via ORAL
  Filled 2017-09-11: qty 1

## 2017-09-11 MED ORDER — TRASTUZUMAB CHEMO 150 MG IV SOLR
450.0000 mg | Freq: Once | INTRAVENOUS | Status: AC
  Administered 2017-09-11: 450 mg via INTRAVENOUS
  Filled 2017-09-11: qty 21.43

## 2017-09-11 MED ORDER — SODIUM CHLORIDE 0.9% FLUSH
10.0000 mL | Freq: Once | INTRAVENOUS | Status: AC
  Administered 2017-09-11: 10 mL via INTRAVENOUS
  Filled 2017-09-11: qty 10

## 2017-09-11 MED ORDER — HEPARIN SOD (PORK) LOCK FLUSH 100 UNIT/ML IV SOLN
500.0000 [IU] | Freq: Once | INTRAVENOUS | Status: AC
  Administered 2017-09-11: 500 [IU] via INTRAVENOUS
  Filled 2017-09-11: qty 5

## 2017-09-11 MED ORDER — ACETAMINOPHEN 325 MG PO TABS
650.0000 mg | ORAL_TABLET | Freq: Once | ORAL | Status: AC
Start: 2017-09-11 — End: 2017-09-11
  Administered 2017-09-11: 650 mg via ORAL
  Filled 2017-09-11: qty 2

## 2017-09-11 NOTE — Progress Notes (Signed)
Here for follow up. Per pt " tell him Im doing fantastic " tired w radiation txs.

## 2017-09-12 ENCOUNTER — Ambulatory Visit
Admission: RE | Admit: 2017-09-12 | Discharge: 2017-09-12 | Disposition: A | Discharge status: Home / Self Care | Payer: Medicare PPO | Source: Ambulatory Visit | Attending: Radiation Oncology | Admitting: Radiation Oncology

## 2017-09-12 DIAGNOSIS — Z510 Encounter for antineoplastic radiation therapy: Secondary | ICD-10-CM | POA: Diagnosis not present

## 2017-09-13 ENCOUNTER — Ambulatory Visit
Admission: RE | Admit: 2017-09-13 | Discharge: 2017-09-13 | Disposition: A | Discharge status: Home / Self Care | Payer: Medicare PPO | Source: Ambulatory Visit | Attending: Radiation Oncology | Admitting: Radiation Oncology

## 2017-09-13 ENCOUNTER — Ambulatory Visit
Admission: RE | Admit: 2017-09-13 | Discharge: 2017-09-13 | Disposition: A | Discharge status: Home / Self Care | Payer: Medicare PPO | Source: Ambulatory Visit | Attending: Surgery | Admitting: Surgery

## 2017-09-13 ENCOUNTER — Ambulatory Visit: Payer: Medicare PPO

## 2017-09-13 DIAGNOSIS — C50911 Malignant neoplasm of unspecified site of right female breast: Secondary | ICD-10-CM

## 2017-09-13 DIAGNOSIS — Z510 Encounter for antineoplastic radiation therapy: Secondary | ICD-10-CM | POA: Diagnosis not present

## 2017-09-13 HISTORY — DX: Personal history of irradiation: Z92.3

## 2017-09-13 HISTORY — DX: Personal history of antineoplastic chemotherapy: Z92.21

## 2017-09-13 IMAGING — MG MM DIGITAL DIAGNOSTIC BILAT W/ TOMO W/ CAD
8 of 12 series · 8 of 32 positions shown · non-contrast
Comparison: Previous exam(s).

CLINICAL DATA: Patient presents for a bilateral diagnostic
examination due to a history of a previous right malignant
lumpectomy [DATE] with re-excision [DATE].

EXAM:
DIGITAL DIAGNOSTIC BILATERAL MAMMOGRAM WITH CAD AND TOMO

[R MLO (1 of 2)]
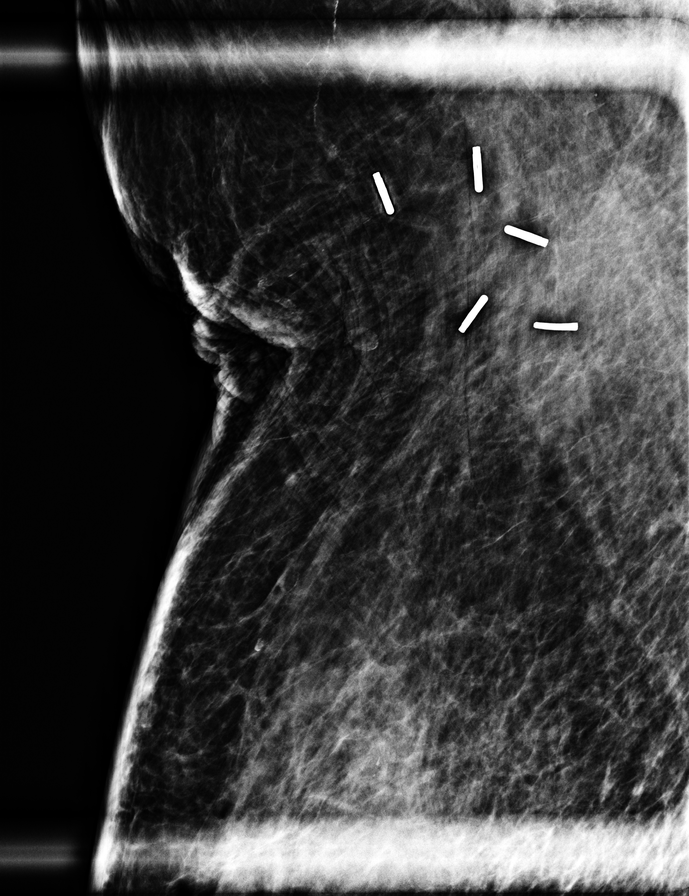

[R MLO (2 of 2)]
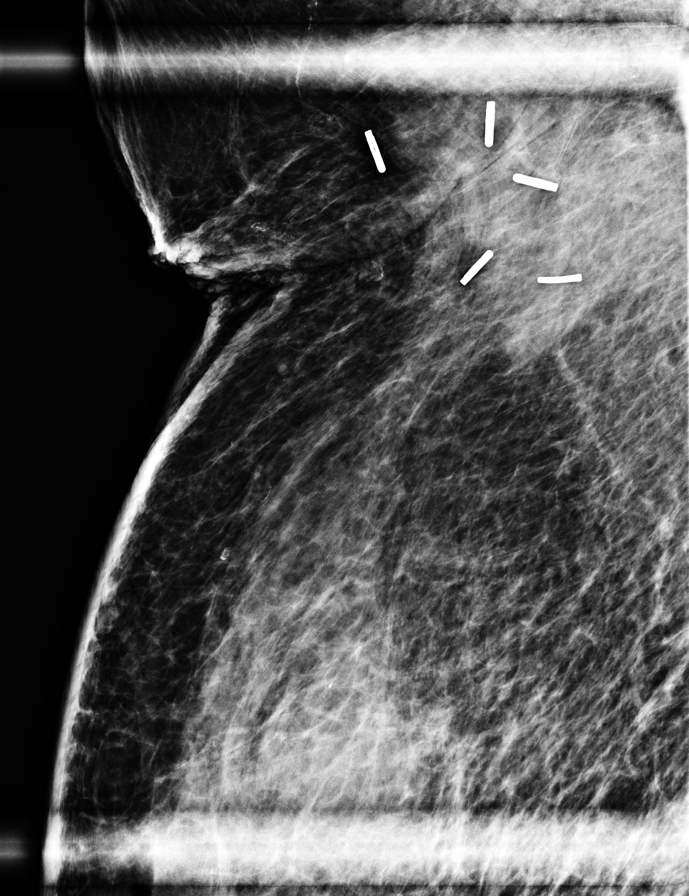

[R MLO synth-2D]
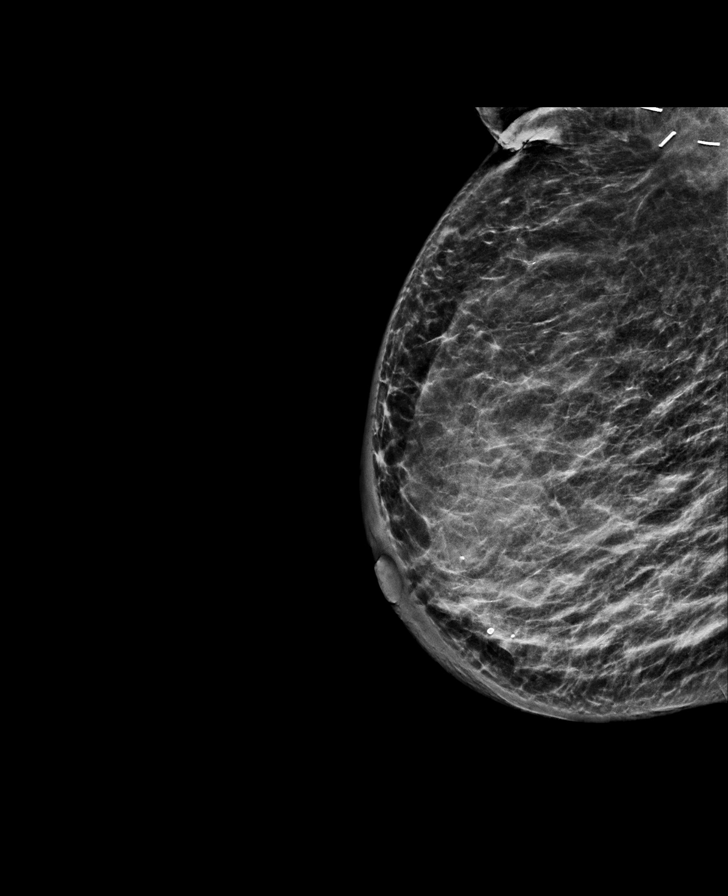

[L MLO synth-2D (1 of 2)]
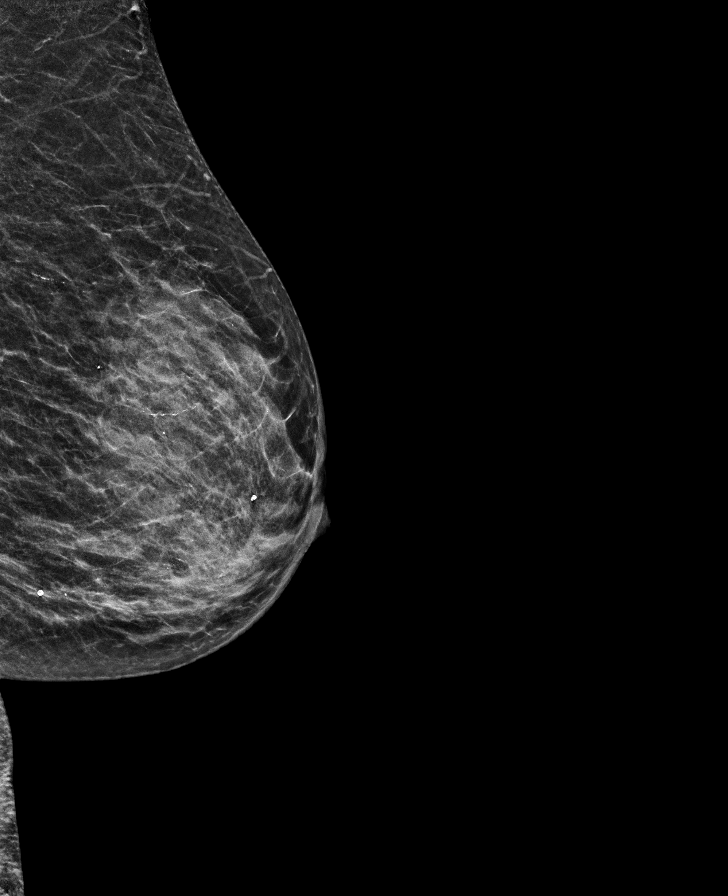

[R CC synth-2D]
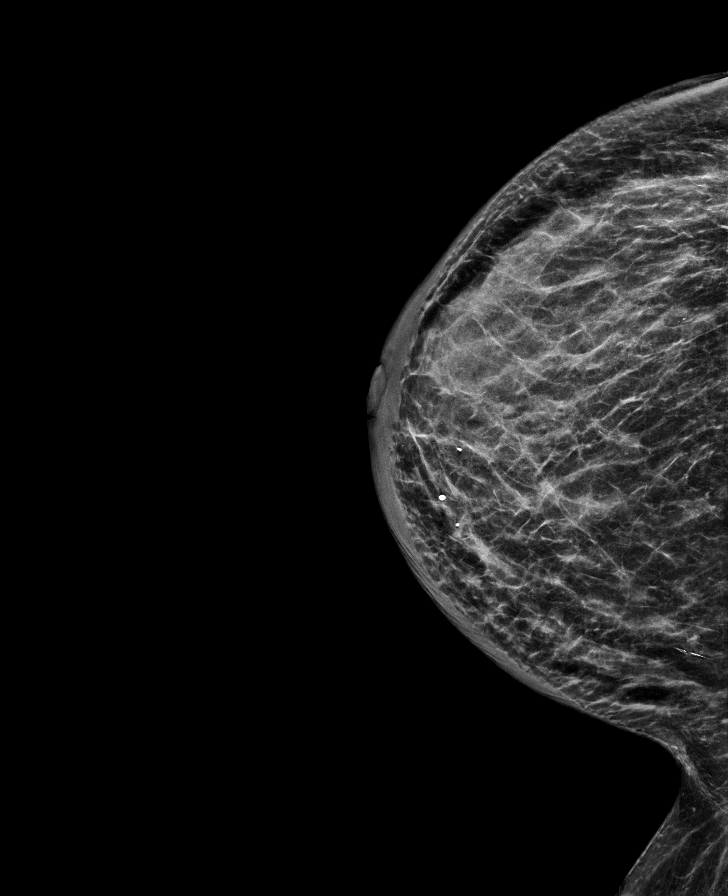

[L CC synth-2D]
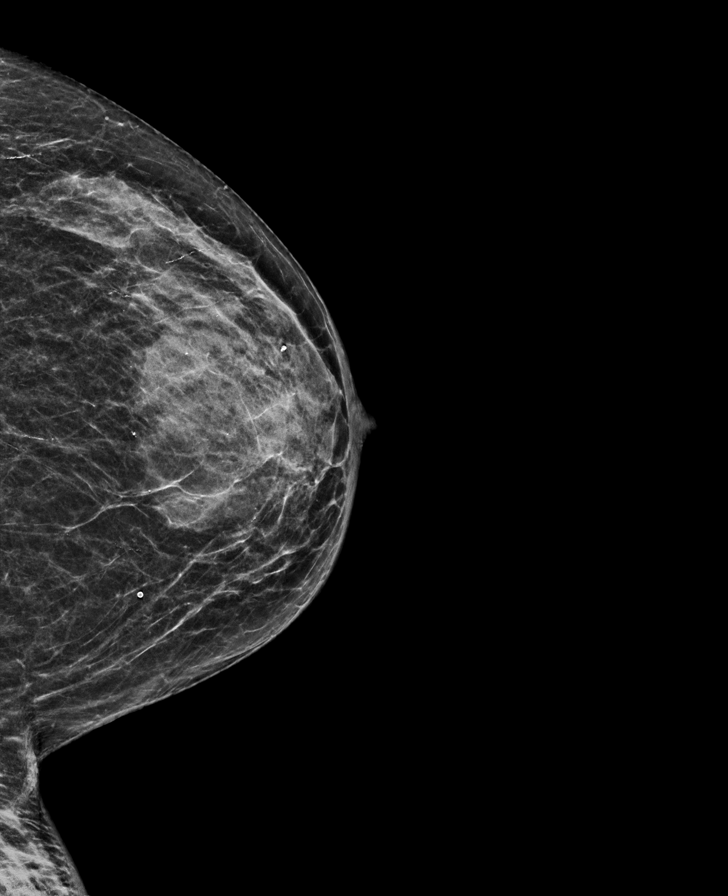

[L MLO synth-2D (2 of 2)]
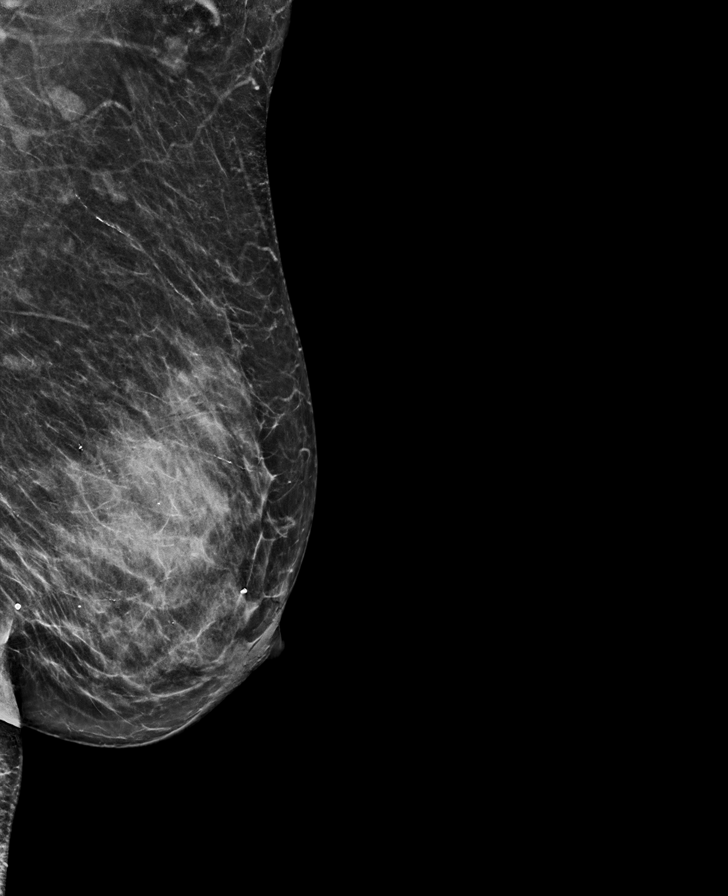

[R MLO tomo · tomo slice 31/62.0]
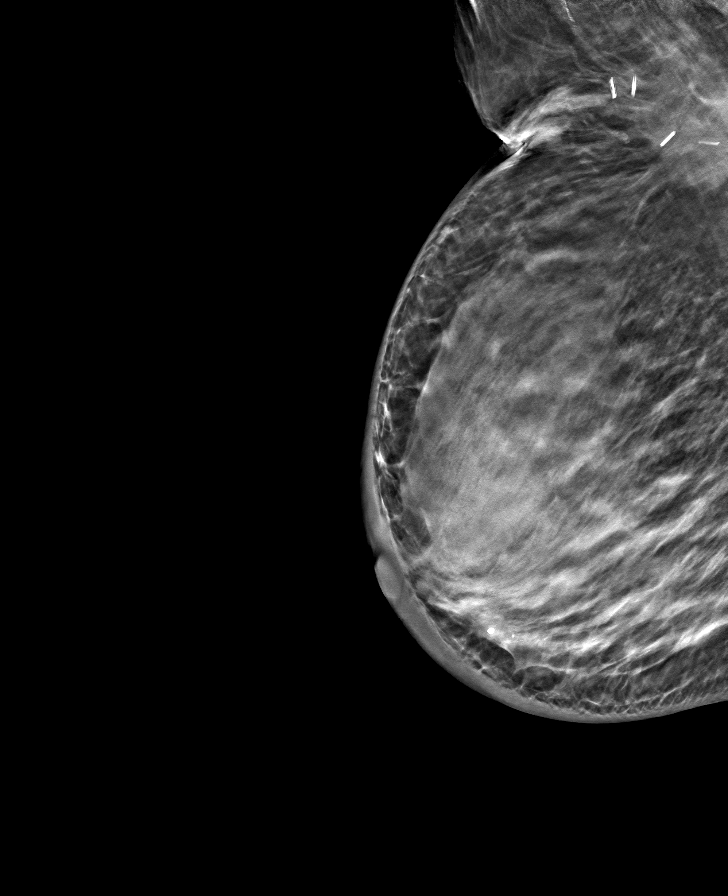

[8 of 32 positions shown; findings below may reference images not displayed]

ACR Breast Density Category c: The breast tissue is heterogeneously
dense, which may obscure small masses.
FINDINGS: Examination demonstrates expected post lumpectomy changes over the
axillary tail of the right breast. Evidence of expected post
radiation change of the right breast with increased parenchymal
markings and skin thickening. Remainder of the exam is unchanged.

Mammographic images were processed with CAD.
IMPRESSION: Expected post lumpectomy changes of the right breast.

RECOMMENDATION:
Recommend continued annual bilateral diagnostic mammographic
evaluation.

I have discussed the findings and recommendations with the patient.
Results were also provided in writing at the conclusion of the
visit. If applicable, a reminder letter will be sent to the patient
regarding the next appointment.

BI-RADS CATEGORY  2: Benign.

## 2017-09-14 ENCOUNTER — Ambulatory Visit: Admission: RE | Admit: 2017-09-14 | Payer: Medicare PPO | Source: Ambulatory Visit

## 2017-09-14 ENCOUNTER — Ambulatory Visit
Admission: RE | Admit: 2017-09-14 | Discharge: 2017-09-14 | Disposition: A | Discharge status: Home / Self Care | Payer: Medicare PPO | Source: Ambulatory Visit | Attending: Radiation Oncology | Admitting: Radiation Oncology

## 2017-09-14 ENCOUNTER — Ambulatory Visit: Payer: Medicare PPO

## 2017-09-14 DIAGNOSIS — Z510 Encounter for antineoplastic radiation therapy: Secondary | ICD-10-CM | POA: Diagnosis not present

## 2017-09-18 ENCOUNTER — Ambulatory Visit
Admission: RE | Admit: 2017-09-18 | Discharge: 2017-09-18 | Disposition: A | Discharge status: Home / Self Care | Payer: Medicare PPO | Source: Ambulatory Visit | Attending: Radiation Oncology | Admitting: Radiation Oncology

## 2017-09-18 DIAGNOSIS — Z510 Encounter for antineoplastic radiation therapy: Secondary | ICD-10-CM | POA: Diagnosis not present

## 2017-09-19 ENCOUNTER — Inpatient Hospital Stay: Payer: Medicare PPO

## 2017-09-19 ENCOUNTER — Ambulatory Visit
Admission: RE | Admit: 2017-09-19 | Discharge: 2017-09-19 | Disposition: A | Discharge status: Home / Self Care | Payer: Medicare PPO | Source: Ambulatory Visit | Attending: Radiation Oncology | Admitting: Radiation Oncology

## 2017-09-19 DIAGNOSIS — Z510 Encounter for antineoplastic radiation therapy: Secondary | ICD-10-CM | POA: Diagnosis not present

## 2017-09-20 ENCOUNTER — Ambulatory Visit
Admission: RE | Admit: 2017-09-20 | Discharge: 2017-09-20 | Disposition: A | Discharge status: Home / Self Care | Payer: Medicare PPO | Source: Ambulatory Visit | Attending: Radiation Oncology | Admitting: Radiation Oncology

## 2017-09-20 DIAGNOSIS — Z510 Encounter for antineoplastic radiation therapy: Secondary | ICD-10-CM | POA: Diagnosis not present

## 2017-09-21 ENCOUNTER — Ambulatory Visit
Admission: RE | Admit: 2017-09-21 | Discharge: 2017-09-21 | Disposition: A | Discharge status: Home / Self Care | Payer: Medicare PPO | Source: Ambulatory Visit | Attending: Radiation Oncology | Admitting: Radiation Oncology

## 2017-09-21 DIAGNOSIS — Z510 Encounter for antineoplastic radiation therapy: Secondary | ICD-10-CM | POA: Diagnosis not present

## 2017-09-24 ENCOUNTER — Ambulatory Visit
Admission: RE | Admit: 2017-09-24 | Discharge: 2017-09-24 | Disposition: A | Discharge status: Home / Self Care | Payer: Medicare PPO | Source: Ambulatory Visit | Attending: Radiation Oncology | Admitting: Radiation Oncology

## 2017-09-24 DIAGNOSIS — Z171 Estrogen receptor negative status [ER-]: Secondary | ICD-10-CM | POA: Diagnosis not present

## 2017-09-24 DIAGNOSIS — Z510 Encounter for antineoplastic radiation therapy: Secondary | ICD-10-CM | POA: Insufficient documentation

## 2017-09-24 DIAGNOSIS — C50411 Malignant neoplasm of upper-outer quadrant of right female breast: Secondary | ICD-10-CM | POA: Insufficient documentation

## 2017-09-25 ENCOUNTER — Ambulatory Visit: Payer: Medicare PPO

## 2017-09-25 ENCOUNTER — Ambulatory Visit
Admission: RE | Admit: 2017-09-25 | Discharge: 2017-09-25 | Disposition: A | Discharge status: Home / Self Care | Payer: Medicare PPO | Source: Ambulatory Visit | Attending: Radiation Oncology | Admitting: Radiation Oncology

## 2017-09-25 DIAGNOSIS — Z510 Encounter for antineoplastic radiation therapy: Secondary | ICD-10-CM | POA: Diagnosis not present

## 2017-09-26 ENCOUNTER — Ambulatory Visit: Payer: Medicare PPO

## 2017-09-27 ENCOUNTER — Ambulatory Visit: Payer: Medicare PPO

## 2017-09-29 NOTE — Progress Notes (Signed)
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  INTERVAL HISTORY: Patient returns to clinic today for further evaluation and consideration of cycle 16 of maintenance Herceptin.  She continues to feel well and remains asymptomatic.  She has now completed her XRT. She does not complain of weakness or fatigue today.  She has no neurologic complaints. She denies any recent fevers or illnesses.  She has no chest pain, cough, or shortness of breath. She denies any nausea, vomiting, constipation, or diarrhea.  She has no abdominal pain.  She has no urinary complaints.  Patient offers no specific complaints today.  REVIEW OF SYSTEMS:   Review of Systems  Constitutional: Negative.  Negative for fever, malaise/fatigue and weight loss.  Respiratory: Negative.  Negative for cough and shortness of breath.   Cardiovascular: Negative.  Negative for chest pain and leg swelling.  Gastrointestinal: Negative.  Negative for abdominal pain, constipation, diarrhea and nausea.  Genitourinary: Negative.  Negative for dysuria.  Musculoskeletal: Negative.  Negative for joint pain.  Skin: Negative.  Negative for rash.  Neurological: Negative.  Negative for sensory change, focal weakness and weakness.  Psychiatric/Behavioral: Negative.  The patient is not nervous/anxious.     As per HPI. Otherwise, a complete review of systems is negative.  PAST MEDICAL HISTORY: Past Medical History:  Diagnosis Date  . Anxiety   . Arthritis   . Cancer [facility])    Right Breast Cancer  . COPD (chronic obstructive pulmonary disease) ([place])   . Depression   .  Dyspnea    with exertion  . GERD (gastroesophageal reflux disease)   . Hyperlipidemia   . Hypertension   . Personal history of chemotherapy   . Personal history of radiation therapy     PAST SURGICAL HISTORY: Past Surgical History:  Procedure Laterality Date  . ABDOMINAL HYSTERECTOMY  1990   Partial  . BREAST BIOPSY Right 10/04/2016   axilla lymph node and axillay tail mass biopsy. invasive mammary carcinoma  . BREAST LUMPECTOMY Right 03/21/2017  . BREAST LUMPECTOMY Right 05/21/2017   re-excision  . BREAST LUMPECTOMY WITH NEEDLE LOCALIZATION Right 03/21/2017   Procedure: BREAST LUMPECTOMY WITH NEEDLE LOCALIZATION;  Surgeon: Clayburn Pert, MD;  Location: [facility] ORS;  Service: General;  Laterality: Right;  . DILATION AND CURETTAGE OF UTERUS    . INGUINAL HERNIA REPAIR Right 03/26/2017   Procedure: HERNIA REPAIR INGUINAL INCARCERATED;  Surgeon: Jules Husbands, MD;  Location: [facility] ORS;  Service: General;  Laterality: Right;  . PORTACATH PLACEMENT Left 10/24/2016   Procedure: INSERTION PORT-A-CATH;  Surgeon: [name], MD;  Location: [facility] ORS;  Service: General;  Laterality: Left;  . RE-EXCISION OF BREAST LUMPECTOMY Right 05/21/2017   Procedure: RE-EXCISION OF BREAST LUMPECTOMY;  Surgeon: Clayburn Pert, MD;  Location: [facility] ORS;  Service: General;  Laterality: Right;  . SENTINEL NODE BIOPSY Right 03/21/2017   Procedure: SENTINEL NODE BIOPSY;  Surgeon: Clayburn Pert, MD;  Location: [facility] ORS;  Service: General;  Laterality: Right;    FAMILY HISTORY: Family History  Problem Relation Age of Onset  . Colon cancer Mother   . Breast cancer Neg Hx     ADVANCED DIRECTIVES (Y/N):  N  HEALTH MAINTENANCE:  Social History   Tobacco Use  . Smoking status: Former Smoker    Packs/day: 0.50    Types: Cigarettes    Last attempt to quit: 07/23/1988    Years since quitting: 29.2  . Smokeless tobacco: Never Used  Substance Use Topics  . Alcohol use: No  . Drug use: No      Colonoscopy:  PAP:  Bone density:  Lipid panel:  Allergies  Allergen Reactions  . Penicillins Anaphylaxis, Swelling and Rash    Has patient had a PCN reaction causing immediate rash, facial/tongue/throat swelling, SOB or lightheadedness with hypotension: Yes Has patient had a PCN reaction causing severe rash involving mucus membranes or skin necrosis: No Has patient had a PCN reaction that required hospitalization: No  Has patient had a PCN reaction occurring within the last 10 years: No If all of the above answers are "NO", then may proceed with Cephalosporin use.   . Atorvastatin Other (See Comments)    Muscle Pain  . Gabapentin Swelling  . Ezetimibe Other (See Comments)    Muscle pain    Current Outpatient Medications  Medication Sig Dispense Refill  . ADVAIR DISKUS 500-50 MCG/DOSE AEPB Inhale 1 puff into the lungs 2 (two) times daily.     Marland Kitchen aspirin EC 81 MG tablet Take 81 mg by mouth daily.    . Aspirin-Caffeine (BAYER BACK & BODY PAIN EX ST PO) Take 2 tablets by mouth 2 (two) times daily as needed (for back pain).     . baclofen (LIORESAL) 10 MG tablet Take 10 mg by mouth at bedtime.  9  . co-enzyme Q-10 30 MG capsule Take 30 mg by mouth daily.    . diphenoxylate-atropine (LOMOTIL) 2.5-0.025 MG tablet Take 1 tablet by mouth 4 (four) times daily as needed for diarrhea or loose stools. 30 tablet 1  . DULoxetine (CYMBALTA) 30 MG capsule Take 30 mg by mouth every morning. Take with 60 mg to total 90 mg once daily  11  . DULoxetine (CYMBALTA) 60 MG capsule Take 60 mg by mouth every morning. Take with 30 mg to total 90 mg once daily  11  . KLOR-CON M20 20 MEQ tablet TAKE 1 TABLET BY MOUTH EVERY DAY 60 tablet 2  . letrozole (FEMARA) 2.5 MG tablet Take 1 tablet (2.5 mg total) by mouth daily. 90 tablet 3  . lidocaine-prilocaine (EMLA) cream Apply 1 application as needed topically (for port access).    Marland Kitchen losartan-hydrochlorothiazide (HYZAAR) 100-25 MG tablet Take 1 tablet by mouth  daily.     . magnesium 30 MG tablet Take 30 mg by mouth 2 (two) times daily.    . Melatonin 5 MG TABS Take 10 mg by mouth at bedtime as needed (sleep).    . montelukast (SINGULAIR) 10 MG tablet Take 10 mg by mouth at bedtime.     . Omega-3 Fatty Acids (FISH OIL) 1000 MG CAPS Take 1,000 mg by mouth daily.    Marland Kitchen omeprazole (PRILOSEC) 20 MG capsule Take 20 mg by mouth every morning.     Marland Kitchen oxyCODONE (ROXICODONE) 5 MG immediate release tablet Take 1 tablet (5 mg total) by mouth every 4 (four) hours as needed for moderate pain or severe pain. 30 tablet 0  . potassium chloride SA (K-DUR,KLOR-CON) 20 MEQ tablet Take 2 tablets (40 mEq total) by mouth daily. This is in addition to ur regular dose of 27mq daily (Patient taking differently: Take 20 mEq by mouth daily. ) 6 tablet 0  . promethazine (  PHENERGAN) 25 MG tablet TAKE 25 MG BY MOUTH EVERY 6-8 HOURS AS NEEDED FOR NAUSEA  2  . triamcinolone ointment (KENALOG) 0.5 % Apply 1 application topically 2 (two) times daily. Rash on bilateral upper extremities 30 g 0   No current facility-administered medications for this visit.    Facility-Administered Medications Ordered in Other Visits  Medication Dose Route Frequency Provider Last Rate Last Dose  . 0.9 %  sodium chloride infusion   Intravenous Once Lloyd Huger, MD      . 0.9 %  sodium chloride infusion   Intravenous Once Lloyd Huger, MD      . 0.9 %  sodium chloride infusion   Intravenous Once Lloyd Huger, MD      . 0.9 %  sodium chloride infusion   Intravenous Continuous Grayland Ormond, Kathlene November, MD      . ondansetron (ZOFRAN) 8 mg in sodium chloride 0.9 % 50 mL IVPB   Intravenous Once Lloyd Huger, MD        OBJECTIVE: Vitals:   10/02/17 0928 10/02/17 0932  BP:  (!) 157/78  Pulse:  79  Resp: 16   Temp:  97.9 F (36.6 C)     Body mass index is 27.73 kg/m.    ECOG FS:0 - Asymptomatic  General: Well-developed, well-nourished, no acute distress. Eyes: Pink conjunctiva,  anicteric sclera. Breast: Exam deferred today. Lungs: Clear to auscultation bilaterally. Heart: Regular rate and rhythm. No rubs, murmurs, or gallops. Abdomen: Soft, nontender, nondistended. No organomegaly noted, normoactive bowel sounds. Musculoskeletal: No edema, cyanosis, or clubbing. Neuro: Alert, answering all questions appropriately. Cranial nerves grossly intact. Skin: No rashes or petechiae noted. Psych: Normal affect.  LAB RESULTS:  Lab Results  Component Value Date   NA 141 10/02/2017   K 3.3 (L) 10/02/2017   CL 106 10/02/2017   CO2 26 10/02/2017   GLUCOSE 124 (H) 10/02/2017   BUN 20 10/02/2017   CREATININE 1.13 (H) 10/02/2017   CALCIUM 9.2 10/02/2017   PROT 7.1 10/02/2017   ALBUMIN 3.5 10/02/2017   AST 23 10/02/2017   ALT 11 (L) 10/02/2017   ALKPHOS 66 10/02/2017   BILITOT 0.6 10/02/2017   GFRNONAA 46 (L) 10/02/2017   GFRAA 53 (L) 10/02/2017    Lab Results  Component Value Date   WBC 5.6 10/02/2017   NEUTROABS 3.1 10/02/2017   HGB 11.9 (L) 10/02/2017   HCT 36.0 10/02/2017   MCV 84.8 10/02/2017   PLT 263 10/02/2017     STUDIES: Mm Diag Breast Tomo Bilateral  Result Date: 09/13/2017 CLINICAL DATA:  Patient presents for a bilateral diagnostic examination due to a history of a previous right malignant lumpectomy November 2018 with re-excision January 2019. EXAM: DIGITAL DIAGNOSTIC BILATERAL MAMMOGRAM WITH CAD AND TOMO COMPARISON:  Previous exam(s). ACR Breast Density Category c: The breast tissue is heterogeneously dense, which may obscure small masses. FINDINGS: Examination demonstrates expected post lumpectomy changes over the axillary tail of the right breast. Evidence of expected post radiation change of the right breast with increased parenchymal markings and skin thickening. Remainder of the exam is unchanged. Mammographic images were processed with CAD. IMPRESSION: Expected post lumpectomy changes of the right breast. RECOMMENDATION: Recommend continued  annual bilateral diagnostic mammographic evaluation. I have discussed the findings and recommendations with the patient. Results were also provided in writing at the conclusion of the visit. If applicable, a reminder letter will be sent to the patient regarding the next appointment. BI-RADS CATEGORY  2: Benign. Electronically  Signed   By: Marin Olp M.D.   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  PLAN:    1. 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  Treatment was discontinued secondary to poor performance status.  Patient underwent her lumpectomy on March 21, 2017, but no biopsy clip was seen in her surgical specimen.  Her most recent surgery on May 21, 2017 to remove her retained clip, confirmed a complete pathological response.  MUGA scan from July 23, 2017 revealed an EF of 66% which is unchanged from previous.  Repeat in July 2019.  Patient completed her adjuvant XRT on September 27, 2017.  Given the PR positivity of her tumor, patient was given a prescription for letrozole which she will take for a total of 5 years completing in February 2024.  Patient's most recent mammogram on Sep 13, 2017 was reported as BI-RADS 2.  Repeat in May 2020.  Proceed with cycle 16 of maintenance Herceptin today.  Return to clinic in 3 weeks for further evaluation and consideration of cycle 17. 2. Renal insufficiency: Creatinine is mildly elevated.  Monitor.   Approximately 30 minutes was spent in discussion of which greater than 50% was consultation.  3. Hypokalemia: Mild.  Continue 20 mEq oral potassium supplementation daily. 4. Hypomagnesemia: Resolved.  Continue oral supplementation.   5.  Incarcerated hernia: Resolved. 6.  Postmenopausal: Patient had a  bone mineral density on July 12, 2017 reported T score of -0.5.  This is considered normal.  Continue calcium and vitamin D supplementation.  Repeat in March 2021.  Approximately 30 minutes was spent in discussion of which greater than 50% was consultation.   Patient expressed understanding and was in agreement with this plan. She also understands that She can call clinic at any time with any questions, concerns, or complaints.   Cancer Staging Malignant neoplasm of right female breast [facility]) Staging form: Breast, AJCC 8th Edition - Clinical stage from 10/16/2016: Stage IIB (cT2, cN1, cM0, G3, ER: Negative, PR: Positive, HER2: Positive) - Signed by Lloyd Huger, MD on 10/16/2016   Lloyd Huger, MD   10/04/2017 9:58 PM

## 2017-10-02 ENCOUNTER — Inpatient Hospital Stay: Payer: Medicare PPO | Attending: Oncology

## 2017-10-02 ENCOUNTER — Other Ambulatory Visit: Payer: Self-pay

## 2017-10-02 ENCOUNTER — Inpatient Hospital Stay (HOSPITAL_BASED_OUTPATIENT_CLINIC_OR_DEPARTMENT_OTHER): Payer: Medicare PPO | Admitting: Oncology

## 2017-10-02 ENCOUNTER — Inpatient Hospital Stay: Payer: Medicare PPO

## 2017-10-02 ENCOUNTER — Encounter: Payer: Self-pay | Admitting: Oncology

## 2017-10-02 VITALS — BP 157/78 | HR 79 | Temp 97.9°F | Resp 16 | Ht 62.0 in | Wt 151.6 lb

## 2017-10-02 DIAGNOSIS — E876 Hypokalemia: Secondary | ICD-10-CM | POA: Diagnosis not present

## 2017-10-02 DIAGNOSIS — C50411 Malignant neoplasm of upper-outer quadrant of right female breast: Secondary | ICD-10-CM | POA: Insufficient documentation

## 2017-10-02 DIAGNOSIS — C50011 Malignant neoplasm of nipple and areola, right female breast: Secondary | ICD-10-CM

## 2017-10-02 DIAGNOSIS — C50919 Malignant neoplasm of unspecified site of unspecified female breast: Secondary | ICD-10-CM

## 2017-10-02 DIAGNOSIS — Z171 Estrogen receptor negative status [ER-]: Principal | ICD-10-CM

## 2017-10-02 DIAGNOSIS — N289 Disorder of kidney and ureter, unspecified: Secondary | ICD-10-CM | POA: Diagnosis not present

## 2017-10-02 DIAGNOSIS — Z5112 Encounter for antineoplastic immunotherapy: Secondary | ICD-10-CM | POA: Diagnosis not present

## 2017-10-02 LAB — COMPREHENSIVE METABOLIC PANEL
ALBUMIN: 3.5 g/dL (ref 3.5–5.0)
ALK PHOS: 66 U/L (ref 38–126)
ALT: 11 U/L — ABNORMAL LOW (ref 14–54)
ANION GAP: 9 (ref 5–15)
AST: 23 U/L (ref 15–41)
BILIRUBIN TOTAL: 0.6 mg/dL (ref 0.3–1.2)
BUN: 20 mg/dL (ref 6–20)
CALCIUM: 9.2 mg/dL (ref 8.9–10.3)
CO2: 26 mmol/L (ref 22–32)
Chloride: 106 mmol/L (ref 101–111)
Creatinine, Ser: 1.13 mg/dL — ABNORMAL HIGH (ref 0.44–1.00)
GFR calc Af Amer: 53 mL/min — ABNORMAL LOW (ref >60)
GFR calc non Af Amer: 46 mL/min — ABNORMAL LOW (ref >60)
GLUCOSE: 124 mg/dL — AB (ref 65–99)
POTASSIUM: 3.3 mmol/L — AB (ref 3.5–5.1)
Sodium: 141 mmol/L (ref 135–145)
TOTAL PROTEIN: 7.1 g/dL (ref 6.5–8.1)

## 2017-10-02 LAB — CBC WITH DIFFERENTIAL/PLATELET
BASOS ABS: 0.1 10*3/uL (ref 0–0.1)
BASOS PCT: 1 %
EOS ABS: 0.4 10*3/uL (ref 0–0.7)
EOS PCT: 7 %
HCT: 36 % (ref 35.0–47.0)
Hemoglobin: 11.9 g/dL — ABNORMAL LOW (ref 12.0–16.0)
LYMPHS PCT: 16 %
Lymphs Abs: 0.9 10*3/uL — ABNORMAL LOW (ref 1.0–3.6)
MCH: 28.1 pg (ref 26.0–34.0)
MCHC: 33.2 g/dL (ref 32.0–36.0)
MCV: 84.8 fL (ref 80.0–100.0)
MONO ABS: 1.1 10*3/uL — AB (ref 0.2–0.9)
Monocytes Relative: 20 %
Neutro Abs: 3.1 10*3/uL (ref 1.4–6.5)
Neutrophils Relative %: 56 %
PLATELETS: 263 10*3/uL (ref 150–440)
RBC: 4.24 MIL/uL (ref 3.80–5.20)
RDW: 16.2 % — AB (ref 11.5–14.5)
WBC: 5.6 10*3/uL (ref 3.6–11.0)

## 2017-10-02 LAB — MAGNESIUM: MAGNESIUM: 1.9 mg/dL (ref 1.7–2.4)

## 2017-10-02 MED ORDER — SODIUM CHLORIDE 0.9 % IV SOLN
Freq: Once | INTRAVENOUS | Status: AC
  Administered 2017-10-02: 10:00:00 via INTRAVENOUS
  Filled 2017-10-02: qty 1000

## 2017-10-02 MED ORDER — HEPARIN SOD (PORK) LOCK FLUSH 100 UNIT/ML IV SOLN
500.0000 [IU] | Freq: Once | INTRAVENOUS | Status: AC
Start: 2017-10-02 — End: 2017-10-02
  Administered 2017-10-02: 500 [IU] via INTRAVENOUS
  Filled 2017-10-02: qty 5

## 2017-10-02 MED ORDER — DIPHENHYDRAMINE HCL 25 MG PO CAPS
25.0000 mg | ORAL_CAPSULE | Freq: Once | ORAL | Status: AC
  Administered 2017-10-02: 25 mg via ORAL
  Filled 2017-10-02: qty 1

## 2017-10-02 MED ORDER — TRASTUZUMAB CHEMO 150 MG IV SOLR
450.0000 mg | Freq: Once | INTRAVENOUS | Status: AC
  Administered 2017-10-02: 450 mg via INTRAVENOUS
  Filled 2017-10-02: qty 21.43

## 2017-10-02 MED ORDER — SODIUM CHLORIDE 0.9% FLUSH
10.0000 mL | Freq: Once | INTRAVENOUS | Status: AC
  Administered 2017-10-02: 10 mL via INTRAVENOUS
  Filled 2017-10-02: qty 10

## 2017-10-02 MED ORDER — ACETAMINOPHEN 325 MG PO TABS
650.0000 mg | ORAL_TABLET | Freq: Once | ORAL | Status: AC
Start: 2017-10-02 — End: 2017-10-02
  Administered 2017-10-02: 650 mg via ORAL
  Filled 2017-10-02: qty 2

## 2017-10-02 NOTE — Progress Notes (Signed)
Patient here for pre treatment check. No changes since last appt. 

## 2017-10-03 ENCOUNTER — Encounter: Payer: Self-pay | Admitting: Surgery

## 2017-10-03 ENCOUNTER — Ambulatory Visit (INDEPENDENT_AMBULATORY_CARE_PROVIDER_SITE_OTHER): Payer: Medicare PPO | Admitting: Surgery

## 2017-10-03 VITALS — BP 165/71 | HR 76 | Temp 98.3°F | Wt 151.0 lb

## 2017-10-03 DIAGNOSIS — C50911 Malignant neoplasm of unspecified site of right female breast: Secondary | ICD-10-CM | POA: Diagnosis not present

## 2017-10-03 NOTE — Patient Instructions (Signed)
We will see you back in 6 months with a mammogram on your right breast only. We will call you and let you know of the appointments.

## 2017-10-03 NOTE — Progress Notes (Signed)
Outpatient Surgical Follow Up  10/03/2017  Krista Cisneros is an 77 y.o. female.   CC:  HPI: Status post breast conservation therapy in January by Dr. Adonis Huguenin she has completed her radiotherapy.  She is here for routine follow-up with a mammogram that shows no sign of malignancy and is personally reviewed.  Past Medical History:  Diagnosis Date  . Anxiety   . Arthritis   . Cancer [facility])    Right Breast Cancer  . COPD (chronic obstructive pulmonary disease) ([place])   . Depression   . Dyspnea    with exertion  . GERD (gastroesophageal reflux disease)   . Hyperlipidemia   . Hypertension   . Personal history of chemotherapy   . Personal history of radiation therapy     Past Surgical History:  Procedure Laterality Date  . ABDOMINAL HYSTERECTOMY  1990   Partial  . BREAST BIOPSY Right 10/04/2016   axilla lymph node and axillay tail mass biopsy. invasive mammary carcinoma  . BREAST LUMPECTOMY Right 03/21/2017  . BREAST LUMPECTOMY Right 05/21/2017   re-excision  . BREAST LUMPECTOMY WITH NEEDLE LOCALIZATION Right 03/21/2017   Procedure: BREAST LUMPECTOMY WITH NEEDLE LOCALIZATION;  Surgeon: Clayburn Pert, MD;  Location: [facility] ORS;  Service: General;  Laterality: Right;  . DILATION AND CURETTAGE OF UTERUS    . INGUINAL HERNIA REPAIR Right 03/26/2017   Procedure: HERNIA REPAIR INGUINAL INCARCERATED;  Surgeon: Jules Husbands, MD;  Location: [facility] ORS;  Service: General;  Laterality: Right;  . PORTACATH PLACEMENT Left 10/24/2016   Procedure: INSERTION PORT-A-CATH;  Surgeon: [name], MD;  Location: [facility] ORS;  Service: General;  Laterality: Left;  . RE-EXCISION OF BREAST LUMPECTOMY Right 05/21/2017   Procedure: RE-EXCISION OF BREAST LUMPECTOMY;  Surgeon: Clayburn Pert, MD;  Location: [facility] ORS;  Service: General;  Laterality: Right;  . SENTINEL NODE BIOPSY Right 03/21/2017   Procedure: SENTINEL NODE BIOPSY;  Surgeon: Clayburn Pert, MD;  Location: [facility] ORS;  Service: General;   Laterality: Right;    Family History  Problem Relation Age of Onset  . Colon cancer Mother   . Breast cancer Neg Hx     Social History:  reports that she quit smoking about 29 years ago. Her smoking use included cigarettes. She smoked 0.50 packs per day. She has never used smokeless tobacco. She reports that she does not drink alcohol or use drugs.  Allergies:  Allergies  Allergen Reactions  . Penicillins Anaphylaxis, Swelling and Rash    Has patient had a PCN reaction causing immediate rash, facial/tongue/throat swelling, SOB or lightheadedness with hypotension: Yes Has patient had a PCN reaction causing severe rash involving mucus membranes or skin necrosis: No Has patient had a PCN reaction that required hospitalization: No  Has patient had a PCN reaction occurring within the last 10 years: No If all of the above answers are "NO", then may proceed with Cephalosporin use.   . Atorvastatin Other (See Comments)    Muscle Pain  . Gabapentin Swelling  . Ezetimibe Other (See Comments)    Muscle pain    Medications reviewed.   Review of Systems:   Review of Systems  Constitutional: Negative.   HENT: Negative.   Eyes: Negative.   Respiratory: Negative.   Cardiovascular: Negative.   Gastrointestinal: Negative.   Genitourinary: Negative.   Musculoskeletal: Negative.   Skin: Negative.   Neurological: Negative.   Endo/Heme/Allergies: Negative.   Psychiatric/Behavioral: Negative.      Physical Exam:  There were no vitals taken for this visit.  Physical Exam  Constitutional: She is oriented to person, place, and time. She appears well-developed and well-nourished. No distress.  HENT:  Head: Normocephalic and atraumatic.  Eyes: Pupils are equal, round, and reactive to light. Right eye exhibits no discharge. Left eye exhibits no discharge.  Pulmonary/Chest: Effort normal. No respiratory distress.  Neurological: She is alert and oriented to person, place, and time.   Skin: Skin is warm and dry. Rash noted. She is not diaphoretic. No erythema.  Vitals reviewed.   Breast exam: Patient changes and small area of rash formation on the right side.  No mass no axillary adenopathy.  Left breast exam is normal without axillary adenopathy or mass.  Results for orders placed or performed in visit on 10/02/17 (from the past 48 hour(s))  Comprehensive metabolic panel     Status: Abnormal   Collection Time: 10/02/17  9:16 AM  Result Value Ref Range   Sodium 141 135 - 145 mmol/L   Potassium 3.3 (L) 3.5 - 5.1 mmol/L   Chloride 106 101 - 111 mmol/L   CO2 26 22 - 32 mmol/L   Glucose, Bld 124 (H) 65 - 99 mg/dL   BUN 20 6 - 20 mg/dL   Creatinine, Ser 1.13 (H) 0.44 - 1.00 mg/dL   Calcium 9.2 8.9 - 10.3 mg/dL   Total Protein 7.1 6.5 - 8.1 g/dL   Albumin 3.5 3.5 - 5.0 g/dL   AST 23 15 - 41 U/L   ALT 11 (L) 14 - 54 U/L   Alkaline Phosphatase 66 38 - 126 U/L   Total Bilirubin 0.6 0.3 - 1.2 mg/dL   GFR calc non Af Amer 46 (L) >60 mL/min   GFR calc Af Amer 53 (L) >60 mL/min    Comment: (NOTE) The eGFR has been calculated using the CKD EPI equation. This calculation has not been validated in all clinical situations. eGFR's persistently <60 mL/min signify possible Chronic Kidney Disease.    Anion gap 9 5 - 15    Comment: Performed at [facility], [address] [place], [place] [address]  CBC with Differential/Platelet     Status: Abnormal   Collection Time: 10/02/17  9:16 AM  Result Value Ref Range   WBC 5.6 3.6 - 11.0 K/uL   RBC 4.24 3.80 - 5.20 MIL/uL   Hemoglobin 11.9 (L) 12.0 - 16.0 g/dL   HCT 36.0 35.0 - 47.0 %   MCV 84.8 80.0 - 100.0 fL   MCH 28.1 26.0 - 34.0 pg   MCHC 33.2 32.0 - 36.0 g/dL   RDW 16.2 (H) 11.5 - 14.5 %   Platelets 263 150 - 440 K/uL   Neutrophils Relative % 56 %   Neutro Abs 3.1 1.4 - 6.5 K/uL   Lymphocytes Relative 16 %   Lymphs Abs 0.9 (L) 1.0 - 3.6 K/uL   Monocytes Relative 20 %   Monocytes Absolute 1.1 (H) 0.2 - 0.9  K/uL   Eosinophils Relative 7 %   Eosinophils Absolute 0.4 0 - 0.7 K/uL   Basophils Relative 1 %   Basophils Absolute 0.1 0 - 0.1 K/uL    Comment: Performed at [facility], [address], [place], [place] [address]  Magnesium     Status: None   Collection Time: 10/02/17  9:16 AM  Result Value Ref Range   Magnesium 1.9 1.7 - 2.4 mg/dL    Comment: Performed at [facility], [address] [place], [place] [address]   No results found.  Assessment/Plan:  Mammogram is reviewed and stable. Patient will follow-up in 6 months for breast exam and right mammogram.  Florene Glen, MD, FACS

## 2017-10-21 NOTE — Progress Notes (Signed)
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  INTERVAL HISTORY: Patient care sustained today for further evaluation and consideration of cycle 17 of maintenance Herceptin.  She continues to tolerate her treatments well without significant side effects.  She does not complain of weakness or fatigue today.  She has no neurologic complaints. She denies any recent fevers or illnesses.  She has no chest pain, cough, or shortness of breath. She denies any nausea, vomiting, constipation, or diarrhea.  She has no abdominal pain.  She has no urinary complaints.  Patient offers no specific complaints today.  REVIEW OF SYSTEMS:   Review of Systems  Constitutional: Negative.  Negative for fever, malaise/fatigue and weight loss.  Respiratory: Negative.  Negative for cough and shortness of breath.   Cardiovascular: Negative.  Negative for chest pain and leg swelling.  Gastrointestinal: Negative.  Negative for abdominal pain, constipation, diarrhea and nausea.  Genitourinary: Negative.  Negative for dysuria.  Musculoskeletal: Negative.  Negative for joint pain.  Skin: Negative.  Negative for rash.  Neurological: Negative.  Negative for sensory change, focal weakness and weakness.  Psychiatric/Behavioral: Negative.  The patient is not nervous/anxious.     As per HPI. Otherwise, a complete review of systems is negative.  PAST MEDICAL HISTORY: Past Medical History:  Diagnosis Date  . Anxiety   . Arthritis   . Cancer [facility])    Right Breast Cancer  . COPD (chronic obstructive pulmonary disease) ([place])   . Depression   . Dyspnea    with exertion  . GERD (gastroesophageal reflux disease)   . Hyperlipidemia   . Hypertension   . Personal history of chemotherapy   . Personal history of radiation therapy     PAST SURGICAL HISTORY: Past Surgical History:  Procedure Laterality Date  . ABDOMINAL HYSTERECTOMY  1990   Partial  . BREAST BIOPSY Right 10/04/2016   axilla lymph node and axillay tail mass biopsy. invasive mammary carcinoma  . BREAST LUMPECTOMY Right 03/21/2017  . BREAST LUMPECTOMY Right 05/21/2017   re-excision  . BREAST LUMPECTOMY WITH NEEDLE LOCALIZATION Right 03/21/2017   Procedure: BREAST LUMPECTOMY WITH NEEDLE LOCALIZATION;  Surgeon: Clayburn Pert, MD;  Location: [facility] ORS;  Service: General;  Laterality: Right;  . DILATION AND CURETTAGE OF UTERUS    . INGUINAL HERNIA REPAIR Right 03/26/2017   Procedure: HERNIA REPAIR INGUINAL INCARCERATED;  Surgeon: Jules Husbands, MD;  Location: [facility] ORS;  Service: General;  Laterality: Right;  . PORTACATH PLACEMENT Left 10/24/2016   Procedure: INSERTION PORT-A-CATH;  Surgeon: [name], MD;  Location: [facility] ORS;  Service: General;  Laterality: Left;  . RE-EXCISION OF BREAST LUMPECTOMY Right 05/21/2017   Procedure: RE-EXCISION OF BREAST LUMPECTOMY;  Surgeon: Clayburn Pert, MD;  Location: [facility] ORS;  Service: General;  Laterality: Right;  . SENTINEL NODE BIOPSY Right 03/21/2017   Procedure: SENTINEL NODE BIOPSY;  Surgeon: Clayburn Pert, MD;  Location: [facility] ORS;  Service: General;  Laterality: Right;    FAMILY HISTORY: Family History  Problem Relation Age of Onset  . Colon cancer Mother   . Breast cancer Neg Hx     ADVANCED DIRECTIVES (Y/N):  N  HEALTH MAINTENANCE: Social History   Tobacco  Use  . Smoking status: Former Smoker    Packs/day: 0.50    Types: Cigarettes    Last attempt to quit: 07/23/1988    Years since quitting: 29.2  . Smokeless tobacco: Never Used  Substance Use Topics  . Alcohol use: No  . Drug use: No      Colonoscopy:  PAP:  Bone density:  Lipid panel:  Allergies  Allergen Reactions  . Penicillins Anaphylaxis, Swelling and Rash    Has patient had a PCN reaction causing immediate rash, facial/tongue/throat swelling, SOB or lightheadedness with hypotension: Yes Has patient had a PCN reaction causing severe rash involving mucus membranes or skin necrosis: No Has patient had a PCN reaction that required hospitalization: No  Has patient had a PCN reaction occurring within the last 10 years: No If all of the above answers are "NO", then may proceed with Cephalosporin use.   . Atorvastatin Other (See Comments)    Muscle Pain  . Gabapentin Swelling  . Ezetimibe Other (See Comments)    Muscle pain    Current Outpatient Medications  Medication Sig Dispense Refill  . ADVAIR DISKUS 500-50 MCG/DOSE AEPB Inhale 1 puff into the lungs 2 (two) times daily.     Marland Kitchen aspirin EC 81 MG tablet Take 81 mg by mouth daily.    . Aspirin-Caffeine (BAYER BACK & BODY PAIN EX ST PO) Take 2 tablets by mouth 2 (two) times daily as needed (for back pain).     . baclofen (LIORESAL) 10 MG tablet Take 10 mg by mouth at bedtime.  9  . benzonatate (TESSALON) 200 MG capsule Take 200 mg by mouth 3 (three) times daily as needed. For cough  0  . co-enzyme Q-10 30 MG capsule Take 30 mg by mouth daily.    . diphenoxylate-atropine (LOMOTIL) 2.5-0.025 MG tablet Take 1 tablet by mouth 4 (four) times daily as needed for diarrhea or loose stools. 30 tablet 1  . DULoxetine (CYMBALTA) 30 MG capsule Take 30 mg by mouth every morning. Take with 60 mg to total 90 mg once daily  11  . DULoxetine (CYMBALTA) 60 MG capsule Take 60 mg by mouth every morning. Take with 30 mg to total 90 mg once daily  11  . KLOR-CON M20 20 MEQ tablet TAKE 1 TABLET BY MOUTH EVERY DAY 60 tablet 2  . letrozole (FEMARA) 2.5 MG tablet Take 1 tablet (2.5 mg total) by mouth daily. 90 tablet 3  . lidocaine-prilocaine (EMLA) cream Apply 1 application as needed  topically (for port access).    Marland Kitchen losartan-hydrochlorothiazide (HYZAAR) 100-25 MG tablet Take 1 tablet by mouth daily.     . magnesium 30 MG tablet Take 30 mg by mouth 2 (two) times daily.    . Melatonin 5 MG TABS Take 10 mg by mouth at bedtime as needed (sleep).    . montelukast (SINGULAIR) 10 MG tablet Take 10 mg by mouth at bedtime.     . Omega-3 Fatty Acids (FISH OIL) 1000 MG CAPS Take 1,000 mg by mouth daily.    Marland Kitchen omeprazole (PRILOSEC) 20 MG capsule Take 20 mg by mouth every morning.     Marland Kitchen oxyCODONE (ROXICODONE) 5 MG immediate release tablet Take 1 tablet (5 mg total) by mouth every 4 (four) hours as needed for moderate pain or severe pain. 30 tablet 0  . promethazine (PHENERGAN) 25 MG tablet TAKE 25 MG BY MOUTH EVERY 6-8 HOURS AS NEEDED FOR NAUSEA  2  . triamcinolone ointment (KENALOG) 0.5 %  Apply 1 application topically 2 (two) times daily. Rash on bilateral upper extremities 30 g 0  . potassium chloride SA (K-DUR,KLOR-CON) 20 MEQ tablet Take 2 tablets (40 mEq total) by mouth daily. This is in addition to ur regular dose of 1mq daily (Patient not taking: Reported on 10/23/2017) 6 tablet 0   No current facility-administered medications for this visit.    Facility-Administered Medications Ordered in Other Visits  Medication Dose Route Frequency Provider Last Rate Last Dose  . 0.9 %  sodium chloride infusion   Intravenous Once FLloyd Huger MD      . 0.9 %  sodium chloride infusion   Intravenous Once FLloyd Huger MD      . 0.9 %  sodium chloride infusion   Intravenous Once FLloyd Huger MD      . 0.9 %  sodium chloride infusion   Intravenous Continuous FGrayland Ormond TKathlene November MD      . ondansetron (ZOFRAN) 8 mg in sodium chloride 0.9 % 50 mL IVPB   Intravenous Once FLloyd Huger MD        OBJECTIVE: Vitals:   10/23/17 1355  BP: 115/71  Pulse: 88  Resp: 18  Temp: 97.6 F (36.4 C)     Body mass index is 27.05 kg/m.    ECOG FS:0 - Asymptomatic  General:  Well-developed, well-nourished, no acute distress. Eyes: Pink conjunctiva, anicteric sclera. Breast: Exam deferred today. Lungs: Clear to auscultation bilaterally. Heart: Regular rate and rhythm. No rubs, murmurs, or gallops. Abdomen: Soft, nontender, nondistended. No organomegaly noted, normoactive bowel sounds. Musculoskeletal: No edema, cyanosis, or clubbing. Neuro: Alert, answering all questions appropriately. Cranial nerves grossly intact. Skin: No rashes or petechiae noted. Psych: Normal affect.  LAB RESULTS:  Lab Results  Component Value Date   NA 139 10/23/2017   K 3.9 10/23/2017   CL 105 10/23/2017   CO2 22 10/23/2017   GLUCOSE 146 (H) 10/23/2017   BUN 21 10/23/2017   CREATININE 1.22 (H) 10/23/2017   CALCIUM 8.9 10/23/2017   PROT 6.7 10/23/2017   ALBUMIN 2.7 (L) 10/23/2017   AST 20 10/23/2017   ALT 15 10/23/2017   ALKPHOS 63 10/23/2017   BILITOT 0.4 10/23/2017   GFRNONAA 42 (L) 10/23/2017   GFRAA 49 (L) 10/23/2017    Lab Results  Component Value Date   WBC 8.7 10/23/2017   NEUTROABS 6.0 10/23/2017   HGB 11.4 (L) 10/23/2017   HCT 34.8 (L) 10/23/2017   MCV 83.7 10/23/2017   PLT 242 10/23/2017     STUDIES: No results found.  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  PLAN:    1. 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  Treatment was discontinued secondary to declining performance status.  Patient underwent her lumpectomy on March 21, 2017, but no biopsy clip was seen in her surgical specimen.  Her most recent surgery on May 21, 2017 to remove her retained clip, confirmed a complete pathological response.  MUGA scan from July 23, 2017 revealed an EF of 66% which is unchanged from previous.  Patient will require repeat  MUGA scan in the next 1 to 2 weeks. Patient completed her adjuvant XRT on September 27, 2017.  Given the PR positivity of her tumor, patient was given a prescription for letrozole  which she will take for a total of 5 years completing in February 2024.  Patient's most recent mammogram on Sep 13, 2017 was reported as BI-RADS 2.  Repeat in May 2020.  Proceed with cycle 17 of maintenance Herceptin today.  Return to clinic in 3 weeks for further evaluation and consideration of cycle 18 of 18 of treatment. 2. Renal insufficiency: Creatinine is mildly elevated at 1.22 today.  Continue to monitor. 3. Hypokalemia: Resolved.  Continue 20 mEq oral potassium supplementation daily. 4. Hypomagnesemia: Resolved.   5.  Incarcerated hernia: Resolved. 6.  Anemia: Mild, monitor. 7.  Bone health: Patient had a bone mineral density on July 12, 2017 reported T score of -0.5.  This is considered normal.  Continue calcium and vitamin D supplementation.  Repeat in March 2021.   Patient expressed understanding and was in agreement with this plan. She also understands that She can call clinic at any time with any questions, concerns, or complaints.   Cancer Staging Malignant neoplasm of right female breast [facility]) Staging form: Breast, AJCC 8th Edition - Clinical stage from 10/16/2016: Stage IIB (cT2, cN1, cM0, G3, ER: Negative, PR: Positive, HER2: Positive) - Signed by Lloyd Huger, MD on 10/16/2016   Lloyd Huger, MD   10/25/2017 7:19 AM

## 2017-10-23 ENCOUNTER — Inpatient Hospital Stay: Payer: Medicare PPO

## 2017-10-23 ENCOUNTER — Other Ambulatory Visit: Payer: Self-pay | Admitting: *Deleted

## 2017-10-23 ENCOUNTER — Inpatient Hospital Stay: Payer: Medicare PPO | Attending: Oncology

## 2017-10-23 ENCOUNTER — Inpatient Hospital Stay (HOSPITAL_BASED_OUTPATIENT_CLINIC_OR_DEPARTMENT_OTHER): Payer: Medicare PPO | Admitting: Oncology

## 2017-10-23 ENCOUNTER — Encounter: Payer: Self-pay | Admitting: Oncology

## 2017-10-23 ENCOUNTER — Other Ambulatory Visit: Payer: Self-pay

## 2017-10-23 VITALS — BP 115/71 | HR 88 | Temp 97.6°F | Resp 18 | Wt 147.9 lb

## 2017-10-23 DIAGNOSIS — C50919 Malignant neoplasm of unspecified site of unspecified female breast: Secondary | ICD-10-CM

## 2017-10-23 DIAGNOSIS — C50411 Malignant neoplasm of upper-outer quadrant of right female breast: Secondary | ICD-10-CM | POA: Diagnosis not present

## 2017-10-23 DIAGNOSIS — N289 Disorder of kidney and ureter, unspecified: Secondary | ICD-10-CM | POA: Diagnosis not present

## 2017-10-23 DIAGNOSIS — D649 Anemia, unspecified: Secondary | ICD-10-CM | POA: Insufficient documentation

## 2017-10-23 DIAGNOSIS — C50011 Malignant neoplasm of nipple and areola, right female breast: Secondary | ICD-10-CM

## 2017-10-23 DIAGNOSIS — Z5112 Encounter for antineoplastic immunotherapy: Secondary | ICD-10-CM | POA: Diagnosis not present

## 2017-10-23 DIAGNOSIS — Z171 Estrogen receptor negative status [ER-]: Secondary | ICD-10-CM | POA: Diagnosis not present

## 2017-10-23 LAB — CBC WITH DIFFERENTIAL/PLATELET
BASOS ABS: 0.1 10*3/uL (ref 0–0.1)
BASOS PCT: 1 %
EOS ABS: 0.3 10*3/uL (ref 0–0.7)
EOS PCT: 4 %
HCT: 34.8 % — ABNORMAL LOW (ref 35.0–47.0)
Hemoglobin: 11.4 g/dL — ABNORMAL LOW (ref 12.0–16.0)
Lymphocytes Relative: 15 %
Lymphs Abs: 1.3 10*3/uL (ref 1.0–3.6)
MCH: 27.5 pg (ref 26.0–34.0)
MCHC: 32.9 g/dL (ref 32.0–36.0)
MCV: 83.7 fL (ref 80.0–100.0)
MONO ABS: 0.9 10*3/uL (ref 0.2–0.9)
Monocytes Relative: 10 %
Neutro Abs: 6 10*3/uL (ref 1.4–6.5)
Neutrophils Relative %: 70 %
PLATELETS: 242 10*3/uL (ref 150–440)
RBC: 4.16 MIL/uL (ref 3.80–5.20)
RDW: 15.5 % — AB (ref 11.5–14.5)
WBC: 8.7 10*3/uL (ref 3.6–11.0)

## 2017-10-23 LAB — COMPREHENSIVE METABOLIC PANEL
ALBUMIN: 2.7 g/dL — AB (ref 3.5–5.0)
ALK PHOS: 63 U/L (ref 38–126)
ALT: 15 U/L (ref 0–44)
ANION GAP: 12 (ref 5–15)
AST: 20 U/L (ref 15–41)
BILIRUBIN TOTAL: 0.4 mg/dL (ref 0.3–1.2)
BUN: 21 mg/dL (ref 8–23)
CALCIUM: 8.9 mg/dL (ref 8.9–10.3)
CO2: 22 mmol/L (ref 22–32)
Chloride: 105 mmol/L (ref 98–111)
Creatinine, Ser: 1.22 mg/dL — ABNORMAL HIGH (ref 0.44–1.00)
GFR calc Af Amer: 49 mL/min — ABNORMAL LOW (ref >60)
GFR calc non Af Amer: 42 mL/min — ABNORMAL LOW (ref >60)
Glucose, Bld: 146 mg/dL — ABNORMAL HIGH (ref 70–99)
Potassium: 3.9 mmol/L (ref 3.5–5.1)
Sodium: 139 mmol/L (ref 135–145)
TOTAL PROTEIN: 6.7 g/dL (ref 6.5–8.1)

## 2017-10-23 LAB — MAGNESIUM: Magnesium: 2 mg/dL (ref 1.7–2.4)

## 2017-10-23 MED ORDER — SODIUM CHLORIDE 0.9% FLUSH
10.0000 mL | Freq: Once | INTRAVENOUS | Status: AC
Start: 2017-10-23 — End: 2017-10-23
  Administered 2017-10-23: 10 mL via INTRAVENOUS
  Filled 2017-10-23: qty 10

## 2017-10-23 MED ORDER — ACETAMINOPHEN 325 MG PO TABS
650.0000 mg | ORAL_TABLET | Freq: Once | ORAL | Status: AC
  Administered 2017-10-23: 650 mg via ORAL
  Filled 2017-10-23: qty 2

## 2017-10-23 MED ORDER — TRASTUZUMAB CHEMO 150 MG IV SOLR
400.0000 mg | Freq: Once | INTRAVENOUS | Status: AC
  Administered 2017-10-23: 400 mg via INTRAVENOUS
  Filled 2017-10-23: qty 19.05

## 2017-10-23 MED ORDER — DIPHENHYDRAMINE HCL 25 MG PO CAPS
25.0000 mg | ORAL_CAPSULE | Freq: Once | ORAL | Status: AC
  Administered 2017-10-23: 25 mg via ORAL
  Filled 2017-10-23: qty 1

## 2017-10-23 MED ORDER — HEPARIN SOD (PORK) LOCK FLUSH 100 UNIT/ML IV SOLN
500.0000 [IU] | Freq: Once | INTRAVENOUS | Status: AC
  Administered 2017-10-23: 500 [IU] via INTRAVENOUS
  Filled 2017-10-23: qty 5

## 2017-10-23 MED ORDER — SODIUM CHLORIDE 0.9 % IV SOLN
Freq: Once | INTRAVENOUS | Status: AC
  Administered 2017-10-23: 15:00:00 via INTRAVENOUS
  Filled 2017-10-23: qty 1000

## 2017-10-23 NOTE — Progress Notes (Signed)
Patient here for follow up. No concerns voiced.  °

## 2017-11-12 ENCOUNTER — Encounter: Payer: Self-pay | Admitting: Radiation Oncology

## 2017-11-12 ENCOUNTER — Other Ambulatory Visit: Payer: Self-pay

## 2017-11-12 ENCOUNTER — Ambulatory Visit
Admission: RE | Admit: 2017-11-12 | Discharge: 2017-11-12 | Disposition: A | Discharge status: Home / Self Care | Payer: Medicare PPO | Source: Ambulatory Visit | Attending: Radiation Oncology | Admitting: Radiation Oncology

## 2017-11-12 ENCOUNTER — Encounter
Admission: RE | Admit: 2017-11-12 | Discharge: 2017-11-12 | Disposition: A | Discharge status: Home / Self Care | Payer: Medicare PPO | Source: Ambulatory Visit | Attending: Oncology | Admitting: Oncology

## 2017-11-12 VITALS — BP 149/80 | HR 85 | Temp 95.5°F | Resp 20 | Wt 147.6 lb

## 2017-11-12 DIAGNOSIS — Z923 Personal history of irradiation: Secondary | ICD-10-CM | POA: Insufficient documentation

## 2017-11-12 DIAGNOSIS — C50919 Malignant neoplasm of unspecified site of unspecified female breast: Secondary | ICD-10-CM | POA: Diagnosis not present

## 2017-11-12 DIAGNOSIS — C50911 Malignant neoplasm of unspecified site of right female breast: Secondary | ICD-10-CM

## 2017-11-12 DIAGNOSIS — C50411 Malignant neoplasm of upper-outer quadrant of right female breast: Secondary | ICD-10-CM

## 2017-11-12 DIAGNOSIS — Z171 Estrogen receptor negative status [ER-]: Secondary | ICD-10-CM | POA: Insufficient documentation

## 2017-11-12 IMAGING — NM NM CARDIA MUGA REST
9 series · 41 of 41 positions shown · non-contrast
Comparison: [DATE]

CLINICAL DATA: Breast cancer on chemotherapy.

EXAM:
NUCLEAR MEDICINE CARDIAC BLOOD POOL IMAGING (MUGA)
TECHNIQUE: Cardiac multi-gated acquisition was performed at rest following
intravenous injection of [VT] labeled red blood cells.
RADIOPHARMACEUTICALS:  22.07 mCi [VT] pertechnetate in-vitro
labeled red blood cells IV

[Series 1000: 70 degree-gated · 3.30mm/px · 6 of 24 frames shown]
[frame 3/24]
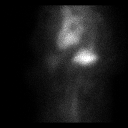
[frame 7/24]
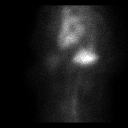
[frame 11/24]
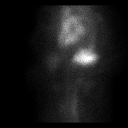
[frame 15/24]
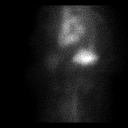
[frame 19/24]
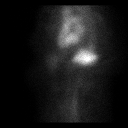
[frame 23/24]
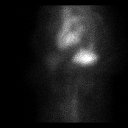

[Series 1000: 45 lao-gated (results) · 3.30mm/px · 6 of 24 frames shown]
[frame 3/24]
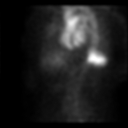
[frame 7/24]
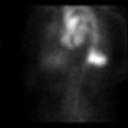
[frame 11/24]
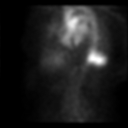
[frame 15/24]
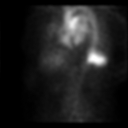
[frame 19/24]
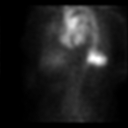
[frame 23/24]
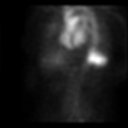

[Series 1000: 45 lao-gated · 3.30mm/px · 6 of 24 frames shown]
[frame 3/24]
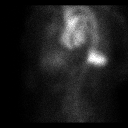
[frame 7/24]
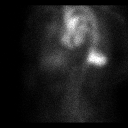
[frame 11/24]
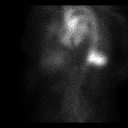
[frame 15/24]
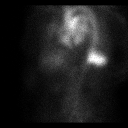
[frame 19/24]
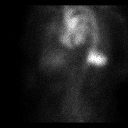
[frame 23/24]
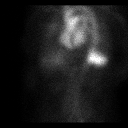

[Series 1000: 45 lao-gated (functional) · 3.30mm/px · 8 of 8 slices shown]
[im 1/8]
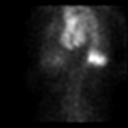
[im 2/8]
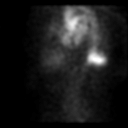
[im 3/8]
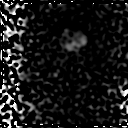
[im 4/8  full-range]
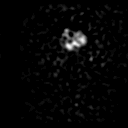
[im 5/8  full-range]
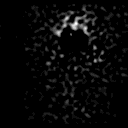
[im 6/8  full-range]
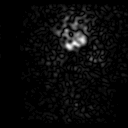
[im 7/8]
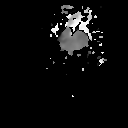
[im 8/8]
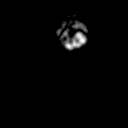

[Series 1000: anterior-gated · 3.30mm/px · 6 of 24 frames shown]
[frame 3/24]
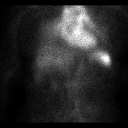
[frame 7/24]
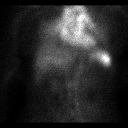
[frame 11/24]
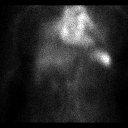
[frame 15/24]
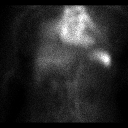
[frame 19/24]
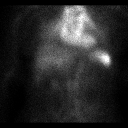
[frame 23/24]
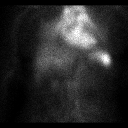

[Series 1000: 45 lao · 3.30mm/px · 1 of 1 slices shown]
[im 1/1]
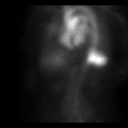

[Series 1000: 45 lao-gated (original with roi) · 3.30mm/px · 6 of 24 frames shown]
[frame 3/24]
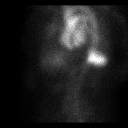
[frame 7/24]
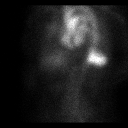
[frame 11/24]
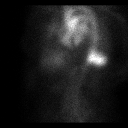
[frame 15/24]
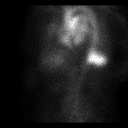
[frame 19/24]
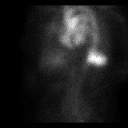
[frame 23/24]
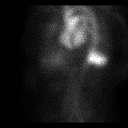

[Series 1000: anterior · 3.30mm/px · 1 of 1 slices shown]
[im 1/1]
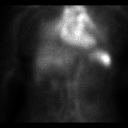

[Series 1000: 70 degree · 3.30mm/px · 1 of 1 slices shown]
[im 1/1]
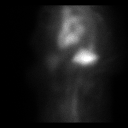

[41 of 41 positions shown; findings below may reference images not displayed]

FINDINGS: Cine images demonstrate normal left ventricular wall motion. No
akinetic or dyskinetic segments are identified. The ejection
fraction was calculated at 67.6%. Prior study was 66%.
IMPRESSION: Normal left ventricular wall motion and normal ejection fraction of
67.6% unchanged since prior studies.

## 2017-11-12 MED ORDER — TECHNETIUM TC 99M-LABELED RED BLOOD CELLS IV KIT
20.0000 | PACK | Freq: Once | INTRAVENOUS | Status: AC | PRN
  Administered 2017-11-12: 22.07 via INTRAVENOUS

## 2017-11-12 NOTE — Progress Notes (Signed)
Radiation Oncology Follow up Note  Name: Krista Cisneros   Date:   11/12/2017 MRN:  [id number] DOB: 03/05/1941    This 77 y.o. female presents to the clinic today for one-month follow-up status post whole breast and peripheral lymphatic radiation radiation to her right breast for stage IIB ER negative PR positive HER-2/neu positive invasive mammary carcinoma.  REFERRING PROVIDER: George, Sionne A, MD  HPI: patient is a 77-year-old female now out 1 month having completed radiation therapy to her right breast and peripheral lymphatics for stage.IIb ER negative PR positive HER-2/neu overexpressed invasive mammary carcinoma the right breast. She has completed her Herceptin.she seen today in routine follow-up is doing fairly well. She specifically denies breast pain cough or bone pain.she is start Femara is time that well without side effect.  COMPLICATIONS OF TREATMENT: none  FOLLOW UP COMPLIANCE: keeps appointments   PHYSICAL EXAM:  BP (!) 149/80   Pulse 85   Temp (!) 95.5 F (35.3 C)   Resp 20   Wt 147 lb 9.6 oz (67 kg)   BMI 27.00 kg/m  Lungs are clear to A&P cardiac examination essentially unremarkable with regular rate and rhythm. No dominant mass or nodularity is noted in either breast in 2 positions examined. Incision is well-healed. No axillary or supraclavicular adenopathy is appreciated. Cosmetic result is excellent. Well-developed well-nourished patient in NAD. HEENT reveals PERLA, EOMI, discs not visualized.  Oral cavity is clear. No oral mucosal lesions are identified. Neck is clear without evidence of cervical or supraclavicular adenopathy. Lungs are clear to A&P. Cardiac examination is essentially unremarkable with regular rate and rhythm without murmur rub or thrill. Abdomen is benign with no organomegaly or masses noted. Motor sensory and DTR levels are equal and symmetric in the upper and lower extremities. Cranial nerves II through XII are grossly intact. Proprioception  is intact. No peripheral adenopathy or edema is identified. No motor or sensory levels are noted. Crude visual fields are within normal range.  RADIOLOGY RESULTS: no current films for review  PLAN: at the present time patient is doing well with no evidence of disease 1 month out from radiation therapy. I'm please were overall progress. She continues on Femara without side effect. I have asked to see her out in 4-5 months for follow-up. She knows to call with any concerns. I would like to take this opportunity to thank you for allowing me to participate in the care of your patient..     , MD   

## 2017-11-12 NOTE — Progress Notes (Signed)
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  INTERVAL HISTORY: Patient returns to clinic today for further evaluation and consideration of cycle 18 of 18 of maintenance Herceptin.  She continues to feel well and remains asymptomatic. She does not complain of weakness or fatigue today.  She has no neurologic complaints. She denies any recent fevers or illnesses.  She has no chest pain, cough, or shortness of breath. She denies any nausea, vomiting, constipation, or diarrhea.  She has no abdominal pain.  She has no urinary complaints.  Patient offers no specific complaints today.  REVIEW OF SYSTEMS:   Review of Systems  Constitutional: Negative.  Negative for fever, malaise/fatigue and weight loss.  Respiratory: Negative.  Negative for cough and shortness of breath.   Cardiovascular: Negative.  Negative for chest pain and leg swelling.  Gastrointestinal: Negative.  Negative for abdominal pain, constipation, diarrhea and nausea.  Genitourinary: Negative.  Negative for dysuria.  Musculoskeletal: Negative.  Negative for joint pain.  Skin: Negative.  Negative for rash.  Neurological: Negative.  Negative for sensory change, focal weakness, weakness and headaches.  Psychiatric/Behavioral: Negative.  The patient is not nervous/anxious.     As per HPI. Otherwise, a complete review of systems is negative.  PAST MEDICAL HISTORY: Past Medical History:  Diagnosis Date  . Anxiety   . Arthritis   . Cancer [facility])    Right Breast Cancer  . COPD (chronic obstructive pulmonary disease) ([place])   . Depression   . Dyspnea    with  exertion  . GERD (gastroesophageal reflux disease)   . Hyperlipidemia   . Hypertension   . Personal history of chemotherapy   . Personal history of radiation therapy     PAST SURGICAL HISTORY: Past Surgical History:  Procedure Laterality Date  . ABDOMINAL HYSTERECTOMY  1990   Partial  . BREAST BIOPSY Right 10/04/2016   axilla lymph node and axillay tail mass biopsy. invasive mammary carcinoma  . BREAST LUMPECTOMY Right 03/21/2017  . BREAST LUMPECTOMY Right 05/21/2017   re-excision  . BREAST LUMPECTOMY WITH NEEDLE LOCALIZATION Right 03/21/2017   Procedure: BREAST LUMPECTOMY WITH NEEDLE LOCALIZATION;  Surgeon: Clayburn Pert, MD;  Location: [facility] ORS;  Service: General;  Laterality: Right;  . DILATION AND CURETTAGE OF UTERUS    . INGUINAL HERNIA REPAIR Right 03/26/2017   Procedure: HERNIA REPAIR INGUINAL INCARCERATED;  Surgeon: Jules Husbands, MD;  Location: [facility] ORS;  Service: General;  Laterality: Right;  . PORTACATH PLACEMENT Left 10/24/2016   Procedure: INSERTION PORT-A-CATH;  Surgeon: [name], MD;  Location: [facility] ORS;  Service: General;  Laterality: Left;  . RE-EXCISION OF BREAST LUMPECTOMY Right 05/21/2017   Procedure: RE-EXCISION OF BREAST LUMPECTOMY;  Surgeon: Clayburn Pert, MD;  Location: [facility] ORS;  Service: General;  Laterality: Right;  . SENTINEL NODE BIOPSY Right 03/21/2017   Procedure: SENTINEL NODE BIOPSY;  Surgeon: Clayburn Pert, MD;  Location: [facility] ORS;  Service: General;  Laterality: Right;    FAMILY HISTORY: Family History  Problem Relation Age of Onset  . Colon cancer Mother   . Breast cancer Neg Hx     ADVANCED DIRECTIVES (Y/N):  N  HEALTH MAINTENANCE: Social History  Tobacco Use  . Smoking status: Former Smoker    Packs/day: 0.50    Types: Cigarettes    Last attempt to quit: 07/23/1988    Years since quitting: 29.3  . Smokeless tobacco: Never Used  Substance Use Topics  . Alcohol use: No  . Drug use: No     Colonoscopy:  PAP:  Bone  density:  Lipid panel:  Allergies  Allergen Reactions  . Penicillins Anaphylaxis, Swelling and Rash    Has patient had a PCN reaction causing immediate rash, facial/tongue/throat swelling, SOB or lightheadedness with hypotension: Yes Has patient had a PCN reaction causing severe rash involving mucus membranes or skin necrosis: No Has patient had a PCN reaction that required hospitalization: No  Has patient had a PCN reaction occurring within the last 10 years: No If all of the above answers are "NO", then may proceed with Cephalosporin use.   . Atorvastatin Other (See Comments)    Muscle Pain  . Gabapentin Swelling  . Ezetimibe Other (See Comments)    Muscle pain    Current Outpatient Medications  Medication Sig Dispense Refill  . ADVAIR DISKUS 500-50 MCG/DOSE AEPB Inhale 1 puff into the lungs 2 (two) times daily.     Marland Kitchen aspirin EC 81 MG tablet Take 81 mg by mouth daily.    . Aspirin-Caffeine (BAYER BACK & BODY PAIN EX ST PO) Take 2 tablets by mouth 2 (two) times daily as needed (for back pain).     . baclofen (LIORESAL) 10 MG tablet Take 10 mg by mouth at bedtime.  9  . benzonatate (TESSALON) 200 MG capsule Take 200 mg by mouth 3 (three) times daily as needed. For cough  0  . co-enzyme Q-10 30 MG capsule Take 30 mg by mouth daily.    . DULoxetine (CYMBALTA) 30 MG capsule Take 30 mg by mouth every morning. Take with 60 mg to total 90 mg once daily  11  . DULoxetine (CYMBALTA) 60 MG capsule Take 60 mg by mouth every morning. Take with 30 mg to total 90 mg once daily  11  . KLOR-CON M20 20 MEQ tablet TAKE 1 TABLET BY MOUTH EVERY DAY 60 tablet 2  . letrozole (FEMARA) 2.5 MG tablet Take 1 tablet (2.5 mg total) by mouth daily. 90 tablet 3  . lidocaine-prilocaine (EMLA) cream Apply 1 application as needed topically (for port access).    Marland Kitchen losartan-hydrochlorothiazide (HYZAAR) 100-25 MG tablet Take 1 tablet by mouth daily.     . magnesium 30 MG tablet Take 30 mg by mouth 2 (two) times daily.     . montelukast (SINGULAIR) 10 MG tablet Take 10 mg by mouth at bedtime.     . Omega-3 Fatty Acids (FISH OIL) 1000 MG CAPS Take 1,000 mg by mouth daily.    Marland Kitchen omeprazole (PRILOSEC) 20 MG capsule Take 20 mg by mouth every morning.     . potassium chloride SA (K-DUR,KLOR-CON) 20 MEQ tablet Take 2 tablets (40 mEq total) by mouth daily. This is in addition to ur regular dose of 24mq daily 6 tablet 0  . triamcinolone ointment (KENALOG) 0.5 % Apply 1 application topically 2 (two) times daily. Rash on bilateral upper extremities 30 g 0  . diphenoxylate-atropine (LOMOTIL) 2.5-0.025 MG tablet Take 1 tablet by mouth 4 (four) times daily as needed for diarrhea or loose stools. (Patient not taking: Reported on 11/13/2017) 30 tablet 1  . Melatonin 5 MG TABS Take 10 mg by mouth at bedtime as needed (sleep).    .Marland Kitchen  oxyCODONE (ROXICODONE) 5 MG immediate release tablet Take 1 tablet (5 mg total) by mouth every 4 (four) hours as needed for moderate pain or severe pain. (Patient not taking: Reported on 11/13/2017) 30 tablet 0  . promethazine (PHENERGAN) 25 MG tablet TAKE 25 MG BY MOUTH EVERY 6-8 HOURS AS NEEDED FOR NAUSEA  2   No current facility-administered medications for this visit.    Facility-Administered Medications Ordered in Other Visits  Medication Dose Route Frequency Provider Last Rate Last Dose  . 0.9 %  sodium chloride infusion   Intravenous Once Lloyd Huger, MD      . 0.9 %  sodium chloride infusion   Intravenous Once Lloyd Huger, MD      . 0.9 %  sodium chloride infusion   Intravenous Once Lloyd Huger, MD      . 0.9 %  sodium chloride infusion   Intravenous Continuous Grayland Ormond, Kathlene November, MD      . ondansetron (ZOFRAN) 8 mg in sodium chloride 0.9 % 50 mL IVPB   Intravenous Once Lloyd Huger, MD        OBJECTIVE: Vitals:   11/13/17 0941  BP: (!) 159/89  Pulse: 76  Resp: 18  Temp: (!) 95.2 F (35.1 C)     Body mass index is 27.27 kg/m.    ECOG FS:0 -  Asymptomatic  General: Well-developed, well-nourished, no acute distress. Eyes: Pink conjunctiva, anicteric sclera. HEENT: Normocephalic, moist mucous membranes, clear oropharnyx. Breast: Patient requested exam be deferred today. Lungs: Clear to auscultation bilaterally. Heart: Regular rate and rhythm. No rubs, murmurs, or gallops. Abdomen: Soft, nontender, nondistended. No organomegaly noted, normoactive bowel sounds. Musculoskeletal: No edema, cyanosis, or clubbing. Neuro: Alert, answering all questions appropriately. Cranial nerves grossly intact. Skin: No rashes or petechiae noted. Psych: Normal affect.  LAB RESULTS:  Lab Results  Component Value Date   NA 141 11/13/2017   K 3.5 11/13/2017   CL 107 11/13/2017   CO2 24 11/13/2017   GLUCOSE 99 11/13/2017   BUN 19 11/13/2017   CREATININE 1.18 (H) 11/13/2017   CALCIUM 8.9 11/13/2017   PROT 7.4 11/13/2017   ALBUMIN 3.0 (L) 11/13/2017   AST 19 11/13/2017   ALT 8 11/13/2017   ALKPHOS 61 11/13/2017   BILITOT 0.4 11/13/2017   GFRNONAA 44 (L) 11/13/2017   GFRAA 51 (L) 11/13/2017    Lab Results  Component Value Date   WBC 10.9 11/13/2017   NEUTROABS 5.6 11/13/2017   HGB 11.3 (L) 11/13/2017   HCT 34.9 (L) 11/13/2017   MCV 82.7 11/13/2017   PLT 426 11/13/2017     STUDIES: Nm Cardiac Muga Rest  Result Date: 11/12/2017 CLINICAL DATA:  Breast cancer on chemotherapy. EXAM: NUCLEAR MEDICINE CARDIAC BLOOD POOL IMAGING (MUGA) TECHNIQUE: Cardiac multi-gated acquisition was performed at rest following intravenous injection of Tc-57mlabeled red blood cells. RADIOPHARMACEUTICALS:  22.07 mCi Tc-918mertechnetate in-vitro labeled red blood cells IV COMPARISON:  07/23/2017 FINDINGS: Cine images demonstrate normal left ventricular wall motion. No akinetic or dyskinetic segments are identified. The ejection fraction was calculated at 67.6%. Prior study was 66%. IMPRESSION: Normal left ventricular wall motion and normal ejection fraction of  67.6% unchanged since prior studies. Electronically Signed   By: P.Marijo Sanes.D.   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  PLAN:    1. Clinical stage IIB ER negative, PR and HER-2  positive invasive carcinoma of the right upper outer quadrant breast: Patient completed cycle 5 of 6 of neoadjuvant chemotherapy on January 30, 2017.  Treatment was discontinued secondary to declining performance status.  Patient underwent her lumpectomy on March 21, 2017, but no biopsy clip was seen in her surgical specimen.  Her most recent surgery on May 21, 2017 to remove her retained clip, confirmed a complete pathological response.  MUGA scan on November 12, 2017 revealed an EF of 67.6% which is unchanged from previous.  Patient completed her adjuvant XRT on September 27, 2017.  Given the PR positivity of her tumor, patient was given a prescription for letrozole which she will take for a total of 5 years completing in February 2024.  Patient's most recent mammogram on Sep 13, 2017 was reported as BI-RADS 2.  Repeat in May 2020.  Proceed with her 18th and final infusion of maintenance Herceptin today.  Return to clinic in 3 months with repeat laboratory work and further evaluation.   2. Renal insufficiency: Creatinine remains mildly elevated at 1.18.  Continue to monitor. 3. Hypokalemia: Resolved.  Continue 20 mEq oral potassium supplementation daily. 4. Anemia: Mild, monitor. 5.  Bone health: Patient had a bone mineral density on July 12, 2017 reported T score of -0.5.  This is considered normal.  Continue calcium and vitamin D supplementation.  Repeat in March 2021.  I spent a total of 30 minutes face-to-face with the patient of which greater than 50% of the visit was spent in counseling and coordination of care as detailed above.    Patient expressed understanding and was in agreement with this plan. She  also understands that She can call clinic at any time with any questions, concerns, or complaints.   Cancer Staging Malignant neoplasm of right female breast [facility]) Staging form: Breast, AJCC 8th Edition - Clinical stage from 10/16/2016: Stage IIB (cT2, cN1, cM0, G3, ER: Negative, PR: Positive, HER2: Positive) - Signed by Lloyd Huger, MD on 10/16/2016   Lloyd Huger, MD   11/16/2017 10:21 AM

## 2017-11-13 ENCOUNTER — Inpatient Hospital Stay (HOSPITAL_BASED_OUTPATIENT_CLINIC_OR_DEPARTMENT_OTHER): Payer: Medicare PPO | Admitting: Oncology

## 2017-11-13 ENCOUNTER — Inpatient Hospital Stay: Payer: Medicare PPO

## 2017-11-13 ENCOUNTER — Other Ambulatory Visit: Payer: Self-pay

## 2017-11-13 VITALS — BP 159/89 | HR 76 | Temp 95.2°F | Resp 18 | Wt 149.1 lb

## 2017-11-13 DIAGNOSIS — N289 Disorder of kidney and ureter, unspecified: Secondary | ICD-10-CM

## 2017-11-13 DIAGNOSIS — Z5112 Encounter for antineoplastic immunotherapy: Secondary | ICD-10-CM | POA: Diagnosis not present

## 2017-11-13 DIAGNOSIS — C50411 Malignant neoplasm of upper-outer quadrant of right female breast: Secondary | ICD-10-CM

## 2017-11-13 DIAGNOSIS — D649 Anemia, unspecified: Secondary | ICD-10-CM

## 2017-11-13 DIAGNOSIS — C50919 Malignant neoplasm of unspecified site of unspecified female breast: Secondary | ICD-10-CM

## 2017-11-13 DIAGNOSIS — C50011 Malignant neoplasm of nipple and areola, right female breast: Secondary | ICD-10-CM

## 2017-11-13 DIAGNOSIS — Z171 Estrogen receptor negative status [ER-]: Secondary | ICD-10-CM

## 2017-11-13 LAB — CBC WITH DIFFERENTIAL/PLATELET
Basophils Absolute: 0.2 10*3/uL — ABNORMAL HIGH (ref 0–0.1)
Basophils Relative: 1 %
EOS ABS: 1.4 10*3/uL — AB (ref 0–0.7)
EOS PCT: 13 %
HCT: 34.9 % — ABNORMAL LOW (ref 35.0–47.0)
HEMOGLOBIN: 11.3 g/dL — AB (ref 12.0–16.0)
LYMPHS ABS: 2.3 10*3/uL (ref 1.0–3.6)
Lymphocytes Relative: 21 %
MCH: 26.9 pg (ref 26.0–34.0)
MCHC: 32.5 g/dL (ref 32.0–36.0)
MCV: 82.7 fL (ref 80.0–100.0)
MONOS PCT: 13 %
Monocytes Absolute: 1.4 10*3/uL — ABNORMAL HIGH (ref 0.2–0.9)
NEUTROS PCT: 52 %
Neutro Abs: 5.6 10*3/uL (ref 1.4–6.5)
PLATELETS: 426 10*3/uL (ref 150–440)
RBC: 4.22 MIL/uL (ref 3.80–5.20)
RDW: 16.8 % — ABNORMAL HIGH (ref 11.5–14.5)
WBC: 10.9 10*3/uL (ref 3.6–11.0)

## 2017-11-13 LAB — COMPREHENSIVE METABOLIC PANEL
ALBUMIN: 3 g/dL — AB (ref 3.5–5.0)
ALT: 8 U/L (ref 0–44)
AST: 19 U/L (ref 15–41)
Alkaline Phosphatase: 61 U/L (ref 38–126)
Anion gap: 10 (ref 5–15)
BUN: 19 mg/dL (ref 8–23)
CHLORIDE: 107 mmol/L (ref 98–111)
CO2: 24 mmol/L (ref 22–32)
CREATININE: 1.18 mg/dL — AB (ref 0.44–1.00)
Calcium: 8.9 mg/dL (ref 8.9–10.3)
GFR calc Af Amer: 51 mL/min — ABNORMAL LOW (ref >60)
GFR calc non Af Amer: 44 mL/min — ABNORMAL LOW (ref >60)
Glucose, Bld: 99 mg/dL (ref 70–99)
Potassium: 3.5 mmol/L (ref 3.5–5.1)
SODIUM: 141 mmol/L (ref 135–145)
Total Bilirubin: 0.4 mg/dL (ref 0.3–1.2)
Total Protein: 7.4 g/dL (ref 6.5–8.1)

## 2017-11-13 LAB — MAGNESIUM: MAGNESIUM: 1.9 mg/dL (ref 1.7–2.4)

## 2017-11-13 MED ORDER — HEPARIN SOD (PORK) LOCK FLUSH 100 UNIT/ML IV SOLN
500.0000 [IU] | Freq: Once | INTRAVENOUS | Status: AC
  Administered 2017-11-13: 500 [IU] via INTRAVENOUS
  Filled 2017-11-13: qty 5

## 2017-11-13 MED ORDER — SODIUM CHLORIDE 0.9 % IV SOLN
Freq: Once | INTRAVENOUS | Status: AC
  Administered 2017-11-13: 10:00:00 via INTRAVENOUS
  Filled 2017-11-13: qty 1000

## 2017-11-13 MED ORDER — ACETAMINOPHEN 325 MG PO TABS
650.0000 mg | ORAL_TABLET | Freq: Once | ORAL | Status: AC
  Administered 2017-11-13: 650 mg via ORAL
  Filled 2017-11-13: qty 2

## 2017-11-13 MED ORDER — SODIUM CHLORIDE 0.9% FLUSH
10.0000 mL | INTRAVENOUS | Status: DC | PRN
  Filled 2017-11-13: qty 10

## 2017-11-13 MED ORDER — SODIUM CHLORIDE 0.9% FLUSH
10.0000 mL | Freq: Once | INTRAVENOUS | Status: AC
  Administered 2017-11-13: 10 mL via INTRAVENOUS
  Filled 2017-11-13: qty 10

## 2017-11-13 MED ORDER — TRASTUZUMAB CHEMO 150 MG IV SOLR
400.0000 mg | Freq: Once | INTRAVENOUS | Status: AC
  Administered 2017-11-13: 400 mg via INTRAVENOUS
  Filled 2017-11-13: qty 19.05

## 2017-11-13 MED ORDER — HEPARIN SOD (PORK) LOCK FLUSH 100 UNIT/ML IV SOLN: 500.0000 [IU] | Freq: Once | INTRAVENOUS | Status: DC | PRN

## 2017-11-13 MED ORDER — DIPHENHYDRAMINE HCL 25 MG PO CAPS
25.0000 mg | ORAL_CAPSULE | Freq: Once | ORAL | Status: AC
  Administered 2017-11-13: 25 mg via ORAL
  Filled 2017-11-13: qty 1

## 2017-11-13 NOTE — Progress Notes (Signed)
Here for follow up. Stated " doing very well "

## 2017-11-14 ENCOUNTER — Ambulatory Visit: Payer: Medicare PPO

## 2017-11-30 ENCOUNTER — Other Ambulatory Visit: Payer: Self-pay | Admitting: Oncology

## 2017-12-07 IMAGING — US US BREAST*R* LIMITED INC AXILLA
1 series · 11 of 11 positions shown · non-contrast
Comparison: Previous exam(s).

CLINICAL DATA: Screening recall for right breast asymmetries.

EXAM:
2D DIGITAL DIAGNOSTIC UNILATERAL RIGHT MAMMOGRAM WITH CAD AND
ADJUNCT TOMO
RIGHT BREAST ULTRASOUND

[Series 1: us breast*right* limited inc axilla · 0.08mm/px · 11 of 11 slices shown]
[im 1/11]
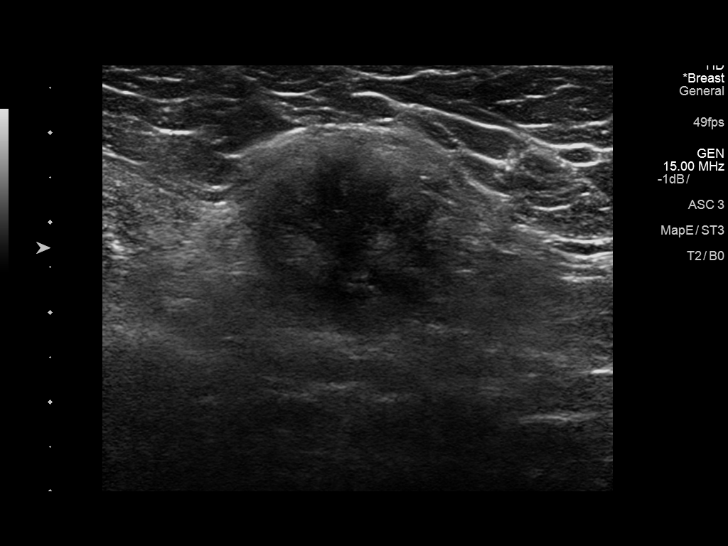
[im 2/11]
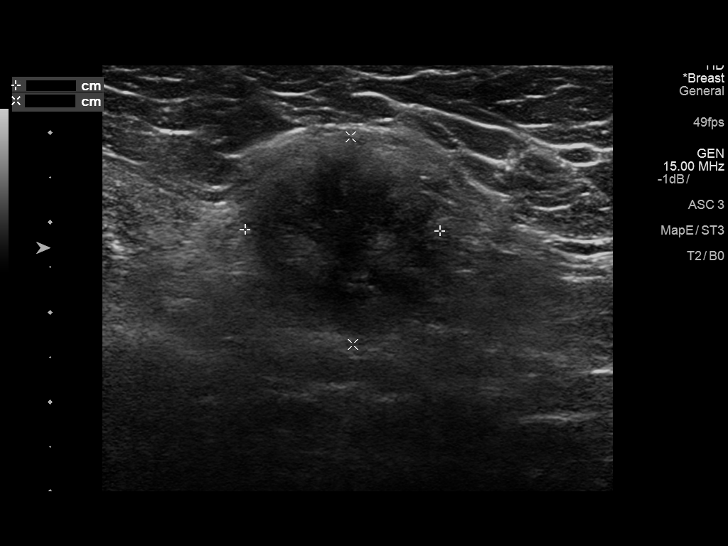
[im 3/11]
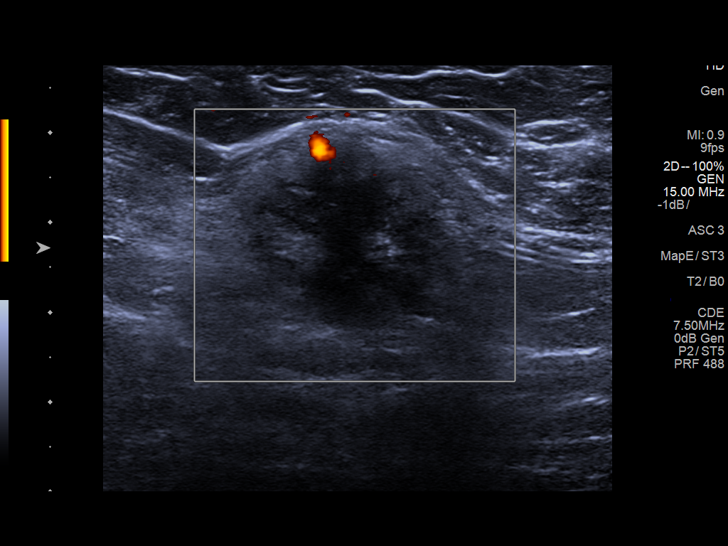
[im 4/11]
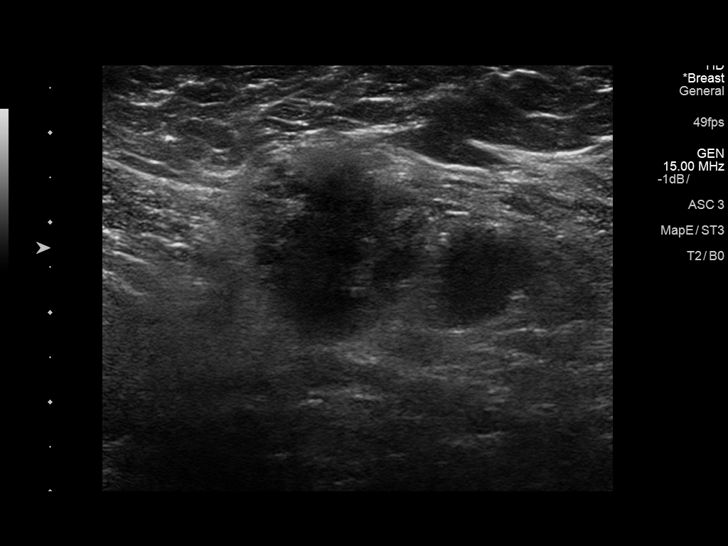
[im 5/11]
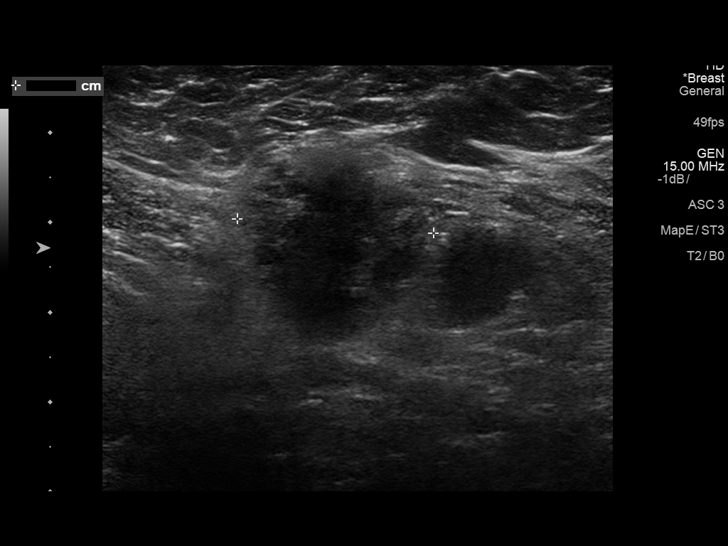
[im 6/11]
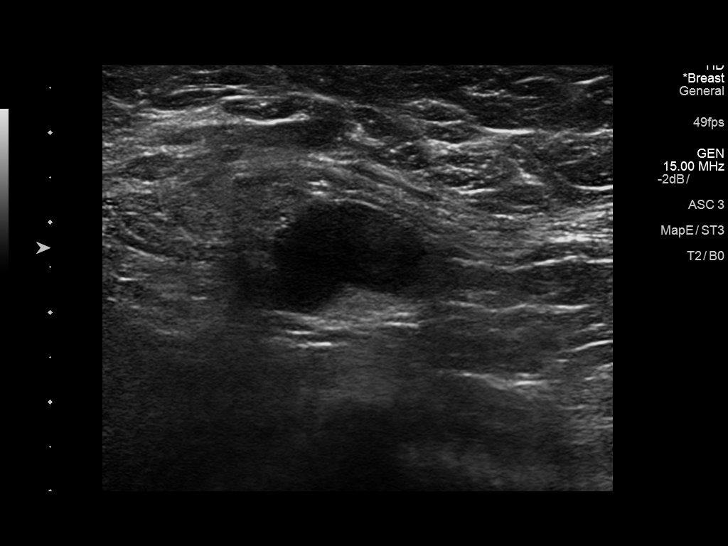
[im 7/11]
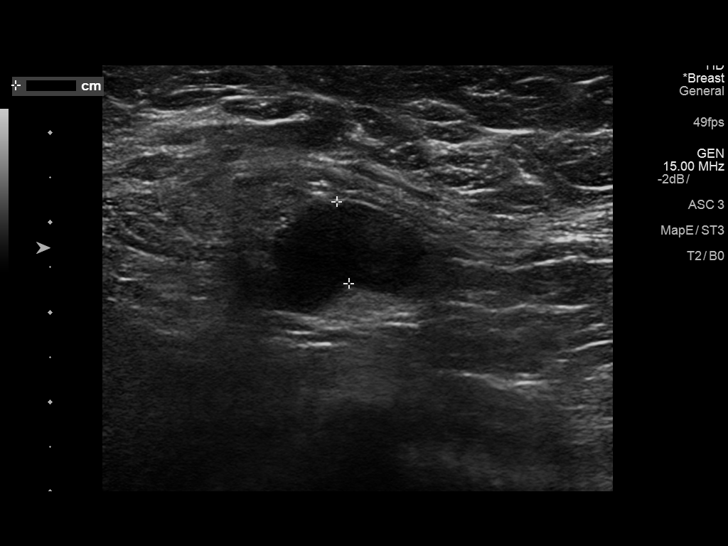
[im 8/11]
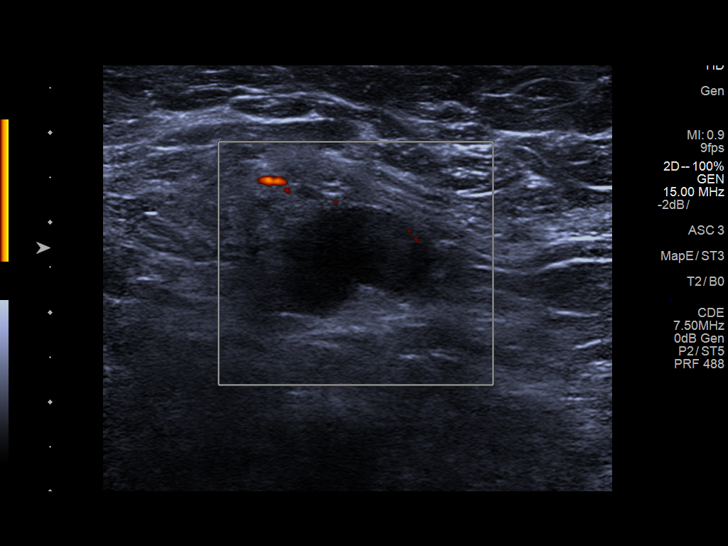
[im 9/11]
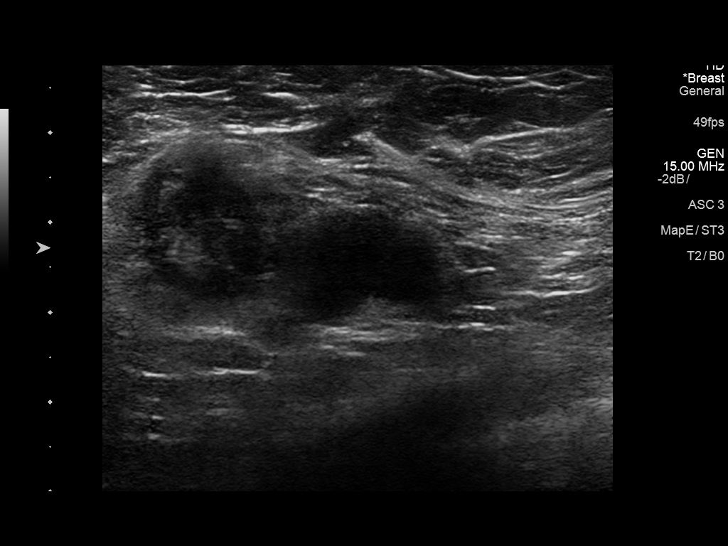
[im 10/11]
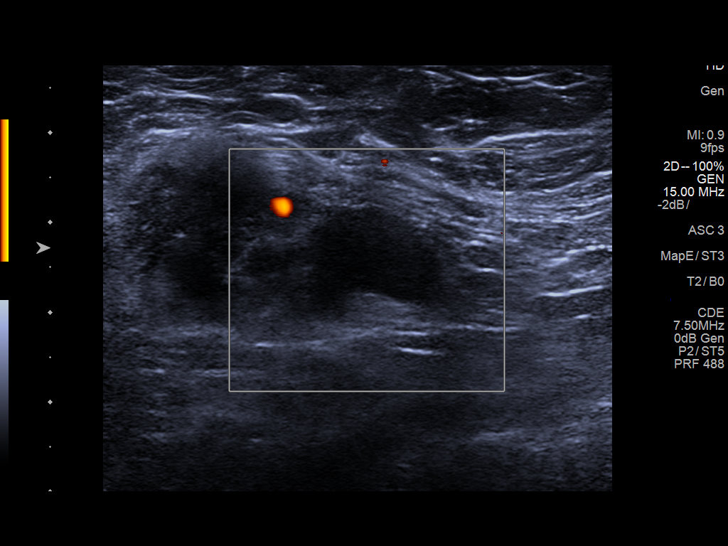
[im 11/11]
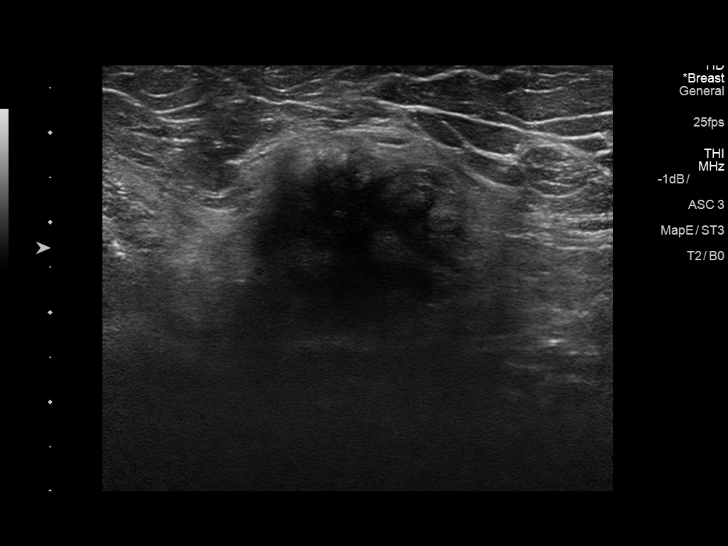

[11 of 11 positions shown; findings below may reference images not displayed]

ACR Breast Density Category c: The breast tissue is heterogeneously
dense, which may obscure small masses.
FINDINGS: Spot compression CC and MLO tomograms as well as XCCL tomograms were
performed of the right breast. The initially questioned asymmetry
seen in the outer right breast on the CC view only appears to
resolve on the additional imaging with findings suggestive of an
area of overlapping fibroglandular tissue. There is a spiculated
mass partially visualized in the right axilla with several adjacent
enlarged axillary lymph nodes, with the large mass measuring
approximately 2.4 cm.

Mammographic images were processed with CAD.

Physical examination of the right axillary tail reveals extreme
tenderness with palpation.

Targeted ultrasound of the outer right breast as well as right
axilla was performed. No definite abnormality seen sonographically
in the outer right breast.

There is an irregular hypoechoic mass in the right axillary tail
measuring 2.2 x 2.3 by 2.2 cm. This mass corresponds well with the
mass seen at mammography. At least 3 adjacent enlarged right
axillary lymph nodes are identified, the larger of which has a
cortex thickness of 0.9 cm.
IMPRESSION: 1. Highly suspicious spiculated mass in the right axillary tail,
with findings concerning for breast carcinoma versus a large nodal
metastasis with extra capsular extension.

2.  Several adjacent abnormal axillary lymph nodes.

RECOMMENDATION:
1. Recommend ultrasound-guided biopsy of the spiculated mass in the
right axillary tail.

2. Recommend ultrasound-guided biopsy of an abnormal lymph node in
the right axilla.

3. Recommend breast MRI to evaluate for primary site of malignancy
if pathology from the right axillary tail mass does not demonstrate
any lymphoid tissue.

I have discussed the findings and recommendations with the patient.
Results were also provided in writing at the conclusion of the
visit. If applicable, a reminder letter will be sent to the patient
regarding the next appointment.

BI-RADS CATEGORY  5: Highly suggestive of malignancy.

## 2017-12-13 IMAGING — MG US BREAST BX W/ LOC DEV EA ADD LESION IMG BX SPEC US GUIDE*R*
1 series · 8 of 8 positions shown · non-contrast
Comparison: Previous exam(s).

ADDENDUM:
Pathology of the right axillary tail mass revealed A. AXILLARY TAIL
MASS, RIGHT; ULTRASOUND-GUIDED CORE BIOPSY: INVASIVE MAMMARY
CARCINOMA, NO SPECIAL TYPE, GRADE 3, SEE COMMENT. Comment: No nodal
remnant, in situ component, or benign mammary tissue is present in
this specimen, so it cannot be reliably determined if this is the
primary site by histology. Correlation with imaging results is
recommended; additional evaluation may be needed if there is concern
about another possible primary site. Size of invasive carcinoma: 14
mm in this sample. Histologic grade of invasive carcinoma: Grade 3.
Ductal carcinoma in situ: Not identified. Lymphovascular invasion:
Not identified. Immunohistochemistry for [L1] was performed, and
the carcinoma demonstrates diffuse moderate nuclear staining,
supporting breast origin. These findings were communicated to
AMRIK in the [facility]HOSPITAL[facility] on [DATE] at
[DATE]. Read-back procedure was performed. Please see also the
separate report from the concurrent right axillary lymph node biopsy
showing metastatic carcinoma ([L1]-[L1]). This was found to be
concordant by Dr. AMRIK.

Recommendation: Recommend bilateral breast MRI with and without
contrast to evaluate for a primary site. Also recommend oncology
referral.
The patient was contacted by phone by AMRIK ([REDACTED]) for a post biopsy check on [DATE] at [DATE]. The
patient stated she had done well following the biopsy with only a
bruise but no bleeding or hematoma. Post biopsy instructions were
reviewed with the patient. All of her questions were answered.
Results were not available at the time of conversation. The patient
was told she will be contacted with results. She was encouraged to
call the [HOSPITAL] with any further questions or
concerns.
Preliminary results were discussed with Dr. AMRIK by the
pathologist on [DATE]. The final report became available on
[DATE]. Results and recommendations for the breast MRI was relayed
to AMRIK, RN, nurse in Dr. [REDACTED]. She will
relay that information to Dr. AMRIK.
The patient was contacted again by phone by AMRIK on
[DATE]. The patient stated Dr. AMRIK had contacted her with
results on [DATE]. An oncology appointment has been made with Dr.
AMRIK for [DATE] at [DATE].
Addendum by AMRIK on [DATE].
CLINICAL DATA: 76-year-old female for ultrasound-guided biopsy of a
right axillary tail mass
EXAM:
ULTRASOUND GUIDED RIGHT BREAST CORE NEEDLE BIOPSY

[Series 1: MG view · 0.08mm/px · 8 of 26 slices shown]
[im 1/26]
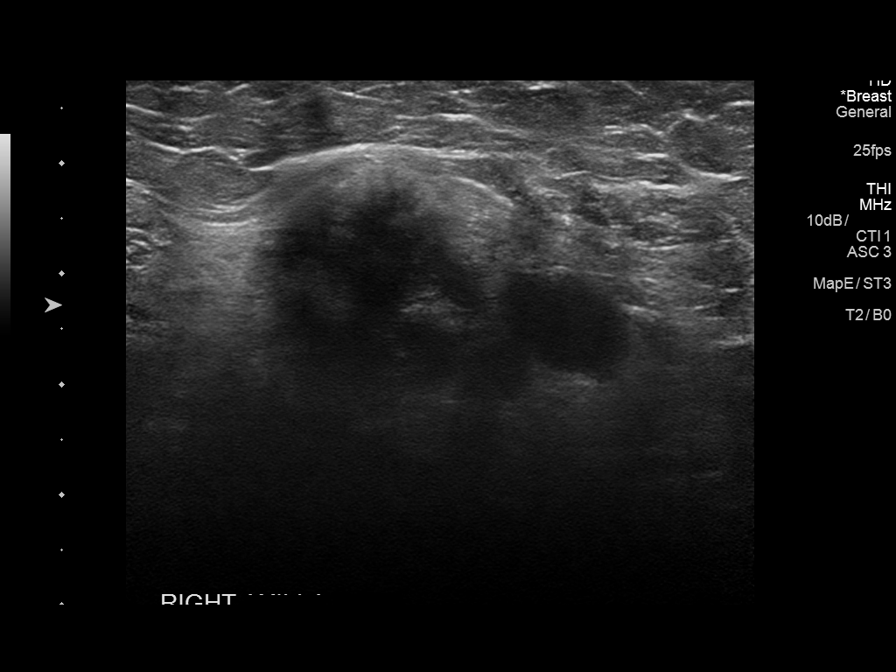
[im 4/26]
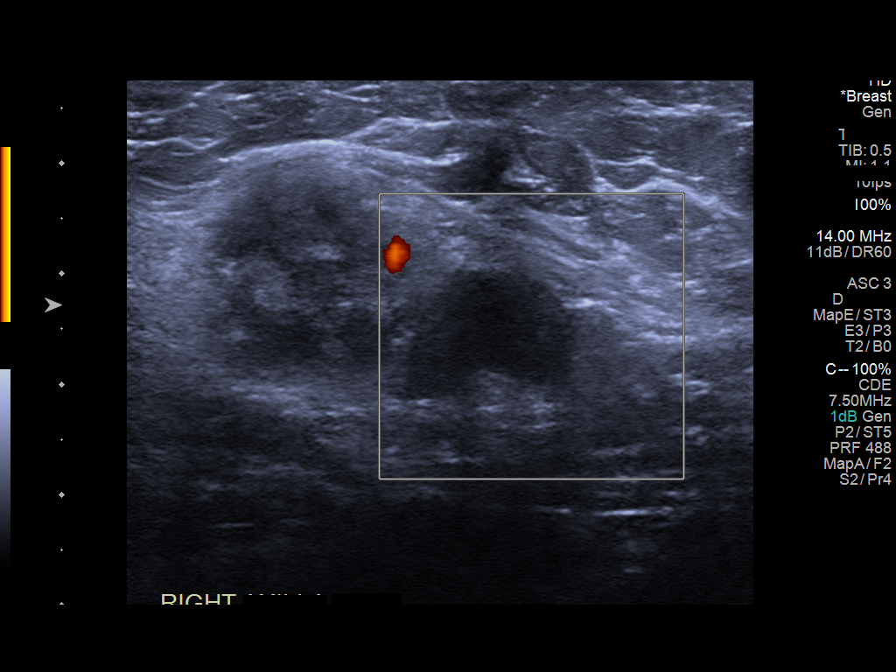
[im 8/26]
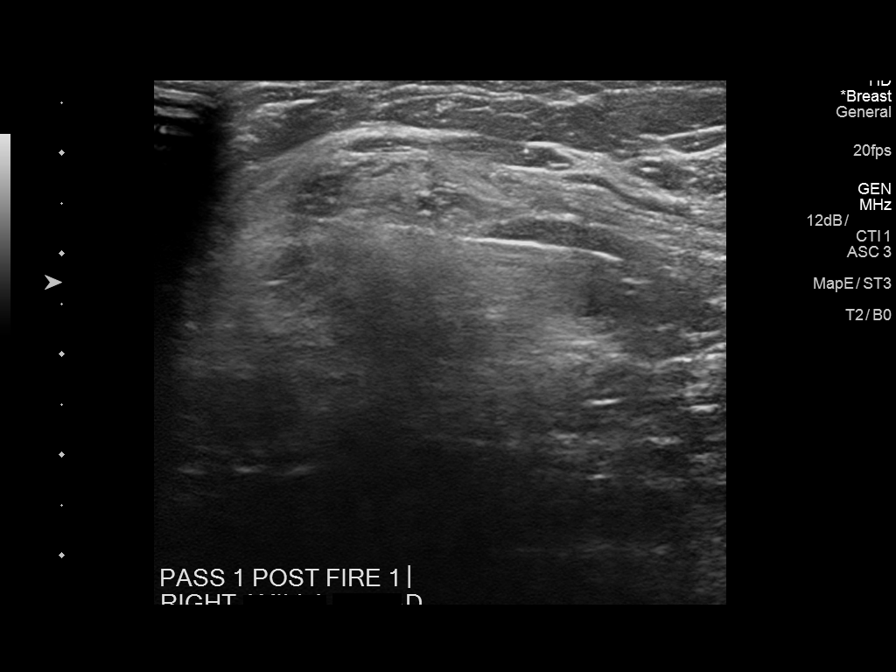
[im 11/26]
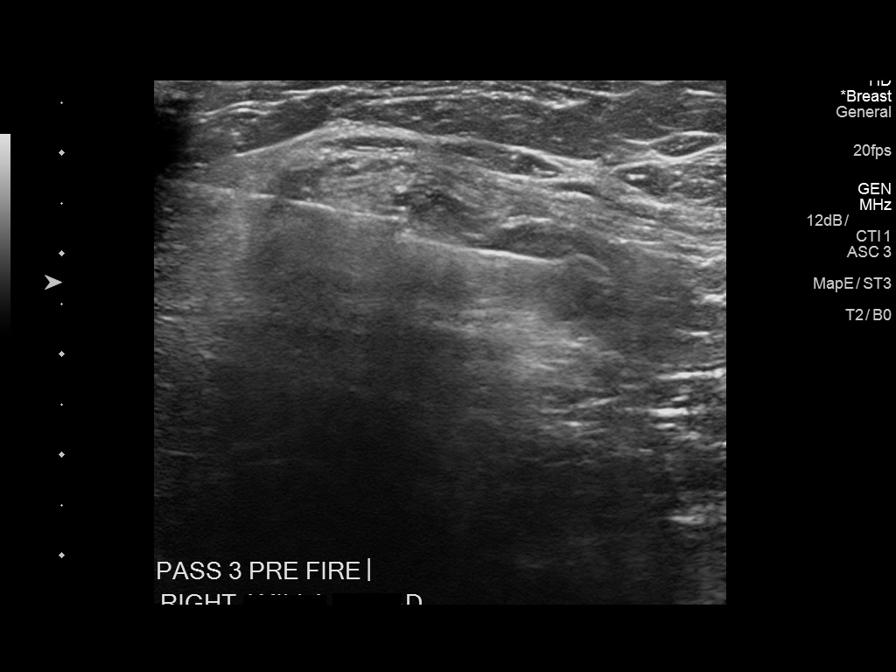
[im 15/26]
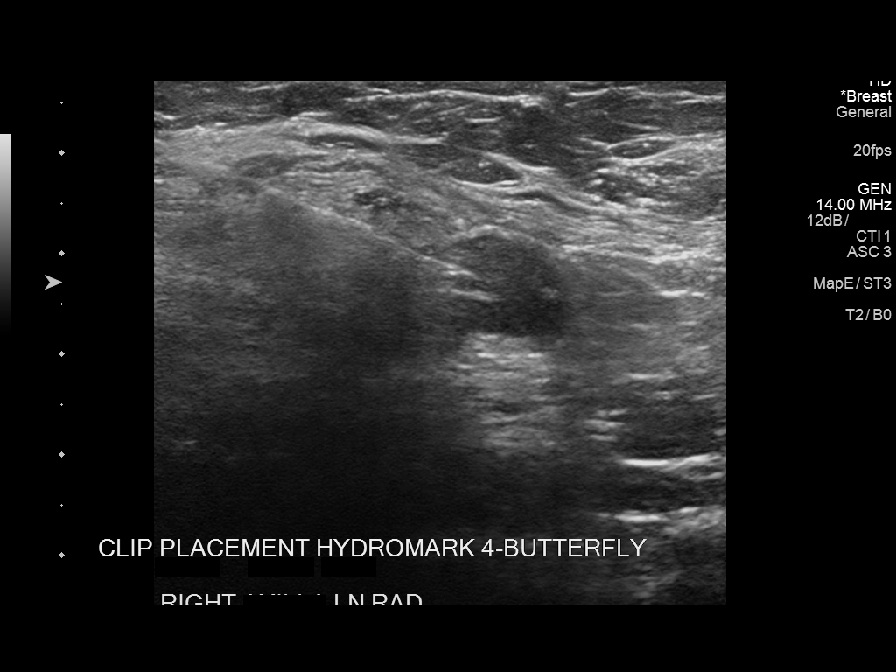
[im 18/26]
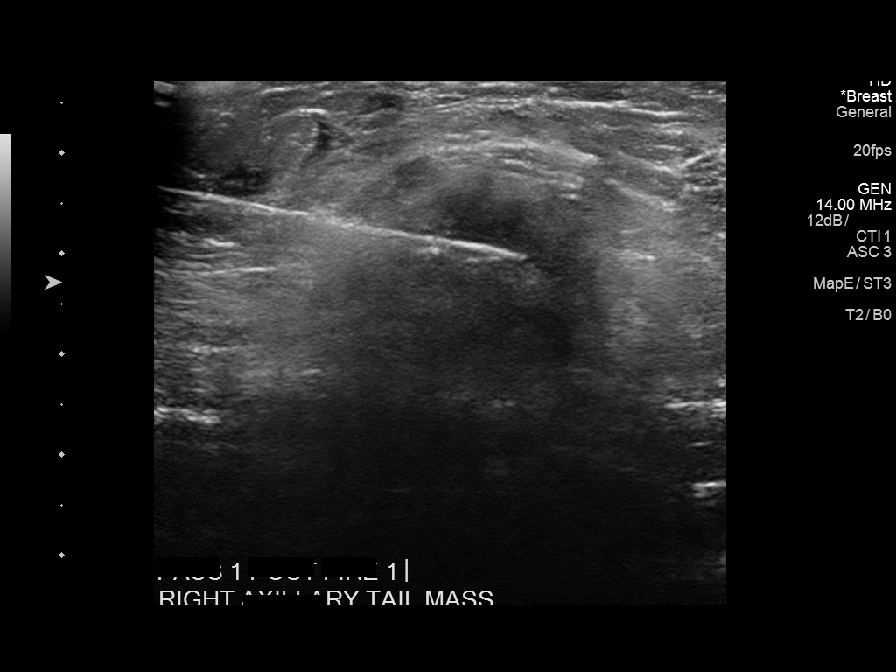
[im 22/26]
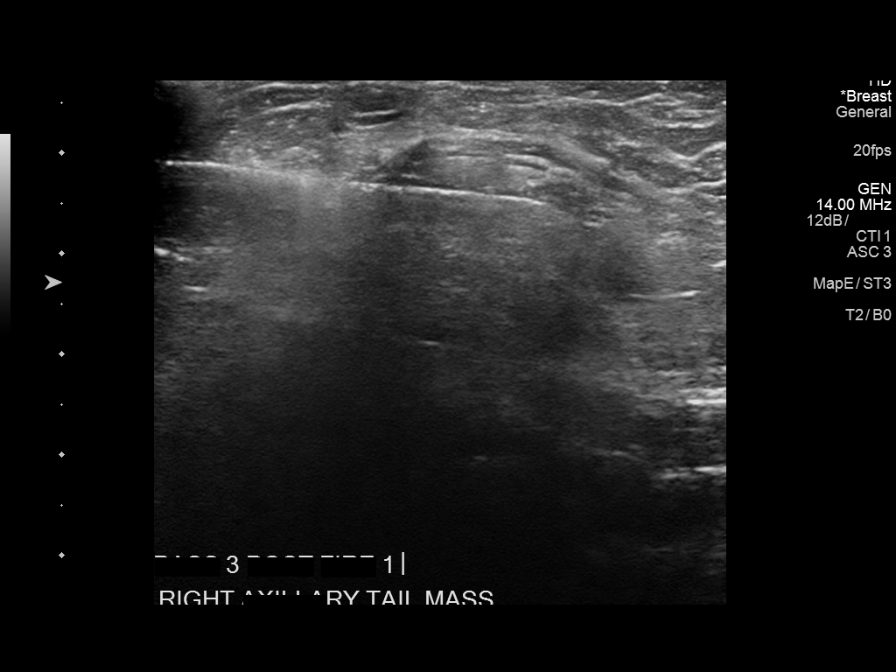
[im 26/26]
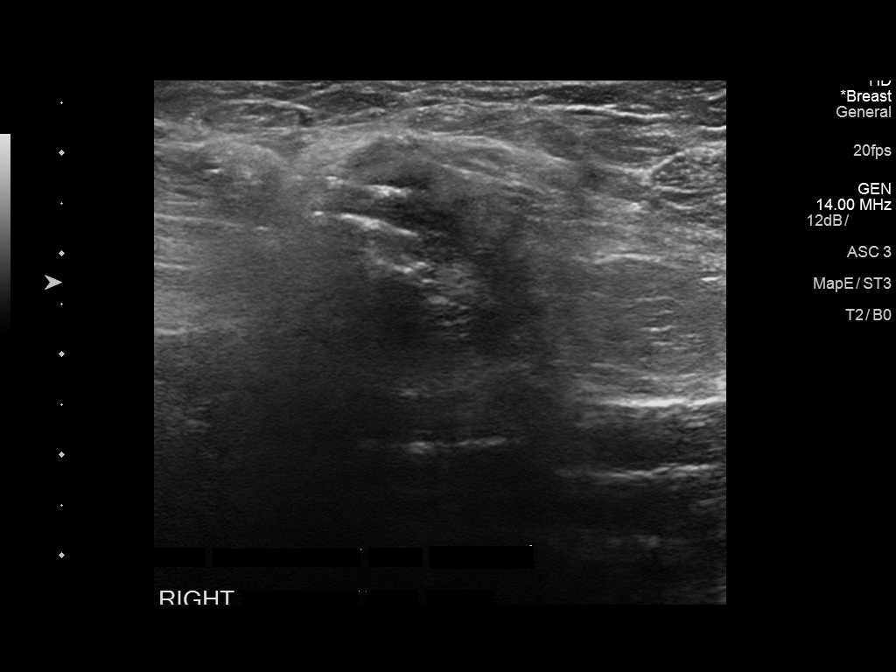

[8 of 8 positions shown; findings below may reference images not displayed]



Using sterile technique and 1% Lidocaine as local anesthetic, under
direct ultrasound visualization, a 14 gauge AMRIK device was
used to perform biopsy of a right axillary tail mass using a lateral
to medial approach. At the conclusion of the procedure a ribbon
shaped tissue marker clip was deployed into the biopsy cavity.
Follow up mammogram was performed and dictated separately.
IMPRESSION: Ultrasound guided biopsy of a right axillary tail mass. No apparent
complications.

## 2017-12-27 ENCOUNTER — Inpatient Hospital Stay: Payer: Medicare PPO | Attending: Oncology

## 2017-12-27 DIAGNOSIS — C50411 Malignant neoplasm of upper-outer quadrant of right female breast: Secondary | ICD-10-CM | POA: Insufficient documentation

## 2017-12-27 DIAGNOSIS — Z452 Encounter for adjustment and management of vascular access device: Secondary | ICD-10-CM | POA: Insufficient documentation

## 2017-12-27 DIAGNOSIS — Z95828 Presence of other vascular implants and grafts: Secondary | ICD-10-CM

## 2017-12-27 MED ORDER — SODIUM CHLORIDE 0.9% FLUSH
10.0000 mL | Freq: Once | INTRAVENOUS | Status: AC
  Administered 2017-12-27: 10 mL via INTRAVENOUS
  Filled 2017-12-27: qty 10

## 2017-12-27 MED ORDER — HEPARIN SOD (PORK) LOCK FLUSH 100 UNIT/ML IV SOLN
500.0000 [IU] | Freq: Once | INTRAVENOUS | Status: AC
  Administered 2017-12-27: 500 [IU] via INTRAVENOUS

## 2018-02-17 NOTE — Progress Notes (Signed)
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  INTERVAL HISTORY: Patient returns to clinic today for routine 60-monthevaluation.  She currently feels well and is asymptomatic.  She is tolerating letrozole well without significant side effects. She does not complain of weakness or fatigue today.  She has no neurologic complaints. She denies any recent fevers or illnesses.  She has no chest pain, cough, or shortness of breath. She denies any nausea, vomiting, constipation, or diarrhea.  She has no urinary complaints.  Patient feels at her baseline offers no specific complaints today.  REVIEW OF SYSTEMS:   Review of Systems  Constitutional: Negative.  Negative for fever, malaise/fatigue and weight loss.  Respiratory: Negative.  Negative for cough and shortness of breath.   Cardiovascular: Negative.  Negative for chest pain and leg swelling.  Gastrointestinal: Negative.  Negative for abdominal pain, constipation, diarrhea and nausea.  Genitourinary: Negative.  Negative for dysuria.  Musculoskeletal: Negative.  Negative for joint pain.  Skin: Negative.  Negative for rash.  Neurological: Negative.  Negative for sensory change, focal weakness, weakness and headaches.  Psychiatric/Behavioral: Negative.  The patient is not nervous/anxious.     As per HPI. Otherwise, a complete review of systems is negative.  PAST MEDICAL HISTORY: Past Medical History:    Diagnosis Date  . Anxiety   . Arthritis   . Cancer ([facility]    Right Breast Cancer  . COPD (chronic obstructive pulmonary disease) ([place]   . Depression   . Dyspnea    with exertion  . GERD (gastroesophageal reflux disease)   . Hyperlipidemia   . Hypertension   . Personal history of chemotherapy   . Personal history of radiation therapy     PAST SURGICAL HISTORY: Past Surgical History:  Procedure Laterality Date  . ABDOMINAL HYSTERECTOMY  1990   Partial  . BREAST BIOPSY Right 10/04/2016   axilla lymph node and axillay tail mass biopsy. invasive mammary carcinoma  . BREAST LUMPECTOMY Right 03/21/2017  . BREAST LUMPECTOMY Right 05/21/2017   re-excision  . BREAST LUMPECTOMY WITH NEEDLE LOCALIZATION Right 03/21/2017   Procedure: BREAST LUMPECTOMY WITH NEEDLE LOCALIZATION;  Surgeon: WClayburn Pert MD;  Location: [facility] ORS;  Service: General;  Laterality: Right;  . DILATION AND CURETTAGE OF UTERUS    . INGUINAL HERNIA REPAIR Right 03/26/2017   Procedure: HERNIA REPAIR INGUINAL INCARCERATED;  Surgeon: PJules Husbands MD;  Location: [facility] ORS;  Service: General;  Laterality: Right;  . PORTACATH PLACEMENT Left 10/24/2016   Procedure: INSERTION PORT-A-CATH;  Surgeon: ONestor Lewandowsky MD;  Location: [facility] ORS;  Service: General;  Laterality: Left;  . RE-EXCISION OF BREAST LUMPECTOMY Right 05/21/2017   Procedure: RE-EXCISION OF BREAST LUMPECTOMY;  Surgeon: WClayburn Pert MD;  Location: [facility] ORS;  Service: General;  Laterality: Right;  . SENTINEL NODE BIOPSY Right 03/21/2017   Procedure: SENTINEL NODE BIOPSY;  Surgeon: WClayburn Pert MD;  Location: [facility] ORS;  Service: General;  Laterality: Right;    FAMILY HISTORY: Family History  Problem Relation Age of Onset  . Colon cancer Mother   .  Breast cancer Neg Hx     ADVANCED DIRECTIVES (Y/N):  N  HEALTH MAINTENANCE: Social History   Tobacco Use  . Smoking status: Former Smoker    Packs/day: 0.50    Types: Cigarettes    Last attempt  to quit: 07/23/1988    Years since quitting: 29.6  . Smokeless tobacco: Never Used  Substance Use Topics  . Alcohol use: No  . Drug use: No     Colonoscopy:  PAP:  Bone density:  Lipid panel:  Allergies  Allergen Reactions  . Penicillins Anaphylaxis, Swelling and Rash    Has patient had a PCN reaction causing immediate rash, facial/tongue/throat swelling, SOB or lightheadedness with hypotension: Yes Has patient had a PCN reaction causing severe rash involving mucus membranes or skin necrosis: No Has patient had a PCN reaction that required hospitalization: No  Has patient had a PCN reaction occurring within the last 10 years: No If all of the above answers are "NO", then may proceed with Cephalosporin use.   . Atorvastatin Other (See Comments)    Muscle Pain  . Gabapentin Swelling  . Ezetimibe Other (See Comments)    Muscle pain    Current Outpatient Medications  Medication Sig Dispense Refill  . ADVAIR DISKUS 500-50 MCG/DOSE AEPB Inhale 1 puff into the lungs 2 (two) times daily.     Marland Kitchen aspirin EC 81 MG tablet Take 81 mg by mouth daily.    . Aspirin-Caffeine (BAYER BACK & BODY PAIN EX ST PO) Take 2 tablets by mouth 2 (two) times daily as needed (for back pain).     . baclofen (LIORESAL) 10 MG tablet Take 10 mg by mouth at bedtime.  9  . benzonatate (TESSALON) 200 MG capsule Take 200 mg by mouth 3 (three) times daily as needed. For cough  0  . co-enzyme Q-10 30 MG capsule Take 30 mg by mouth daily.    . diphenoxylate-atropine (LOMOTIL) 2.5-0.025 MG tablet Take 1 tablet by mouth 4 (four) times daily as needed for diarrhea or loose stools. 30 tablet 1  . DULoxetine (CYMBALTA) 30 MG capsule Take 30 mg by mouth every morning. Take with 60 mg to total 90 mg once daily  11  . DULoxetine (CYMBALTA) 60 MG capsule Take 60 mg by mouth every morning. Take with 30 mg to total 90 mg once daily  11  . KLOR-CON M20 20 MEQ tablet TAKE 1 TABLET BY MOUTH EVERY DAY 60 tablet 2  . letrozole (FEMARA)  2.5 MG tablet Take 1 tablet (2.5 mg total) by mouth daily. 90 tablet 3  . lidocaine-prilocaine (EMLA) cream Apply 1 application as needed topically (for port access).    Marland Kitchen losartan-hydrochlorothiazide (HYZAAR) 100-25 MG tablet Take 1 tablet by mouth daily.     . magnesium 30 MG tablet Take 30 mg by mouth 2 (two) times daily.    . Melatonin 5 MG TABS Take 10 mg by mouth at bedtime as needed (sleep).    . montelukast (SINGULAIR) 10 MG tablet Take 10 mg by mouth at bedtime.     . Omega-3 Fatty Acids (FISH OIL) 1000 MG CAPS Take 1,000 mg by mouth daily.    Marland Kitchen omeprazole (PRILOSEC) 20 MG capsule Take 20 mg by mouth every morning.     Marland Kitchen oxyCODONE (ROXICODONE) 5 MG immediate release tablet Take 1 tablet (5 mg total) by mouth every 4 (four) hours as needed for moderate pain or severe pain. 30 tablet 0  . potassium chloride SA (K-DUR,KLOR-CON) 20  MEQ tablet Take 2 tablets (40 mEq total) by mouth daily. This is in addition to ur regular dose of 53mq daily 6 tablet 0  . promethazine (PHENERGAN) 25 MG tablet TAKE 25 MG BY MOUTH EVERY 6-8 HOURS AS NEEDED FOR NAUSEA  2  . triamcinolone ointment (KENALOG) 0.5 % Apply 1 application topically 2 (two) times daily. Rash on bilateral upper extremities 30 g 0   No current facility-administered medications for this visit.    Facility-Administered Medications Ordered in Other Visits  Medication Dose Route Frequency Provider Last Rate Last Dose  . 0.9 %  sodium chloride infusion   Intravenous Once FLloyd Huger MD      . 0.9 %  sodium chloride infusion   Intravenous Once FLloyd Huger MD      . 0.9 %  sodium chloride infusion   Intravenous Once FLloyd Huger MD      . 0.9 %  sodium chloride infusion   Intravenous Continuous FGrayland Ormond TKathlene November MD      . ondansetron (ZOFRAN) 8 mg in sodium chloride 0.9 % 50 mL IVPB   Intravenous Once FLloyd Huger MD        OBJECTIVE: Vitals:   02/19/18 1502 02/19/18 1506  BP:  (!) 148/84  Pulse:  83    Resp: 12   Temp:  97.7 F (36.5 C)     Body mass index is 26.92 kg/m.    ECOG FS:0 - Asymptomatic  General: Well-developed, well-nourished, no acute distress. Eyes: Pink conjunctiva, anicteric sclera. HEENT: Normocephalic, moist mucous membranes. Breast: Bilateral breast and axilla without lumps or masses. Lungs: Clear to auscultation bilaterally. Heart: Regular rate and rhythm. No rubs, murmurs, or gallops. Abdomen: Soft, nontender, nondistended. No organomegaly noted, normoactive bowel sounds. Musculoskeletal: No edema, cyanosis, or clubbing. Neuro: Alert, answering all questions appropriately. Cranial nerves grossly intact. Skin: No rashes or petechiae noted. Psych: Normal affect.  LAB RESULTS:  Lab Results  Component Value Date   NA 141 11/13/2017   K 3.5 11/13/2017   CL 107 11/13/2017   CO2 24 11/13/2017   GLUCOSE 99 11/13/2017   BUN 19 11/13/2017   CREATININE 1.18 (H) 11/13/2017   CALCIUM 8.9 11/13/2017   PROT 7.4 11/13/2017   ALBUMIN 3.0 (L) 11/13/2017   AST 19 11/13/2017   ALT 8 11/13/2017   ALKPHOS 61 11/13/2017   BILITOT 0.4 11/13/2017   GFRNONAA 44 (L) 11/13/2017   GFRAA 51 (L) 11/13/2017    Lab Results  Component Value Date   WBC 10.9 11/13/2017   NEUTROABS 5.6 11/13/2017   HGB 11.3 (L) 11/13/2017   HCT 34.9 (L) 11/13/2017   MCV 82.7 11/13/2017   PLT 426 11/13/2017     STUDIES: No results found.  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  PLAN:    1. 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  Treatment was discontinued secondary to declining performance status.  Patient underwent her lumpectomy on March 21, 2017, but no biopsy clip was seen in her surgical specimen.  She had repeat surgery on May 21, 2017  to remove her retained clip, confirmed a complete pathological response.  MUGA scan on November 12, 2017 revealed an EF of 67.6% which is unchanged from previous.  Patient completed her adjuvant XRT  on September 27, 2017.  Given the PR positivity of her tumor, patient was given a prescription for letrozole which she will take for a total of 5 years completing in February 2024.  Patient's most recent mammogram on Sep 13, 2017 was reported as BI-RADS 2.  Repeat in May 2020.  Return to clinic in 3 months for routine evaluation. 2. Renal insufficiency: Mild, monitor. 3. Hypokalemia: Resolved.  Continue 20 mEq oral potassium supplementation daily. 4. Anemia: Mild, monitor. 5.  Bone health: Patient had a bone mineral density on July 12, 2017 reported T score of -0.5.  This is considered normal.  Continue calcium and vitamin D supplementation.  Repeat in March 2021.  I spent a total of 20 minutes face-to-face with the patient of which greater than 50% of the visit was spent in counseling and coordination of care as detailed above.   Patient expressed understanding and was in agreement with this plan. She also understands that She can call clinic at any time with any questions, concerns, or complaints.   Cancer Staging Malignant neoplasm of right female breast [facility]) Staging form: Breast, AJCC 8th Edition - Clinical stage from 10/16/2016: Stage IIB (cT2, cN1, cM0, G3, ER: Negative, PR: Positive, HER2: Positive) - Signed by Lloyd Huger, MD on 10/16/2016   Lloyd Huger, MD   02/22/2018 12:02 PM

## 2018-02-19 ENCOUNTER — Inpatient Hospital Stay (HOSPITAL_BASED_OUTPATIENT_CLINIC_OR_DEPARTMENT_OTHER): Payer: Medicare PPO | Admitting: Oncology

## 2018-02-19 ENCOUNTER — Inpatient Hospital Stay: Payer: Medicare PPO | Attending: Oncology

## 2018-02-19 ENCOUNTER — Encounter: Payer: Self-pay | Admitting: Oncology

## 2018-02-19 ENCOUNTER — Other Ambulatory Visit: Payer: Self-pay

## 2018-02-19 VITALS — BP 148/84 | HR 83 | Temp 97.7°F | Resp 12 | Ht 62.0 in | Wt 147.2 lb

## 2018-02-19 DIAGNOSIS — D649 Anemia, unspecified: Secondary | ICD-10-CM | POA: Insufficient documentation

## 2018-02-19 DIAGNOSIS — Z79811 Long term (current) use of aromatase inhibitors: Secondary | ICD-10-CM | POA: Insufficient documentation

## 2018-02-19 DIAGNOSIS — N289 Disorder of kidney and ureter, unspecified: Secondary | ICD-10-CM | POA: Insufficient documentation

## 2018-02-19 DIAGNOSIS — Z95828 Presence of other vascular implants and grafts: Secondary | ICD-10-CM

## 2018-02-19 DIAGNOSIS — Z170 Estrogen receptor positive status [ER+]: Secondary | ICD-10-CM

## 2018-02-19 DIAGNOSIS — C50411 Malignant neoplasm of upper-outer quadrant of right female breast: Secondary | ICD-10-CM | POA: Insufficient documentation

## 2018-02-19 DIAGNOSIS — Z171 Estrogen receptor negative status [ER-]: Principal | ICD-10-CM

## 2018-02-19 MED ORDER — HEPARIN SOD (PORK) LOCK FLUSH 100 UNIT/ML IV SOLN
INTRAVENOUS | Status: AC
  Filled 2018-02-19: qty 5

## 2018-02-19 MED ORDER — HEPARIN SOD (PORK) LOCK FLUSH 100 UNIT/ML IV SOLN
500.0000 [IU] | Freq: Once | INTRAVENOUS | Status: AC
  Administered 2018-02-19: 500 [IU] via INTRAVENOUS

## 2018-02-19 MED ORDER — SODIUM CHLORIDE 0.9% FLUSH
10.0000 mL | Freq: Once | INTRAVENOUS | Status: AC
  Administered 2018-02-19: 10 mL via INTRAVENOUS
  Filled 2018-02-19: qty 10

## 2018-02-19 NOTE — Progress Notes (Signed)
Patient here for follow up. No changes since last appt. 

## 2018-02-19 NOTE — Progress Notes (Signed)
Survivorship Care Plan visit completed.  Treatment summary reviewed and given to patient.  ASCO answers booklet reviewed and given to patient.  CARE program and Cancer Transitions discussed with patient along with other resources cancer center offers to patients and caregivers.  Patient verbalized understanding.    

## 2018-03-12 ENCOUNTER — Encounter: Payer: Self-pay | Admitting: Emergency Medicine

## 2018-03-12 ENCOUNTER — Other Ambulatory Visit: Payer: Self-pay

## 2018-03-12 ENCOUNTER — Emergency Department
Admission: EM | Admit: 2018-03-12 | Discharge: 2018-03-12 | Disposition: A | Discharge status: Home / Self Care | Payer: Medicare PPO | Attending: Emergency Medicine | Admitting: Emergency Medicine

## 2018-03-12 DIAGNOSIS — R339 Retention of urine, unspecified: Secondary | ICD-10-CM | POA: Insufficient documentation

## 2018-03-12 DIAGNOSIS — Z853 Personal history of malignant neoplasm of breast: Secondary | ICD-10-CM | POA: Diagnosis not present

## 2018-03-12 DIAGNOSIS — E876 Hypokalemia: Secondary | ICD-10-CM | POA: Diagnosis not present

## 2018-03-12 DIAGNOSIS — I10 Essential (primary) hypertension: Secondary | ICD-10-CM | POA: Insufficient documentation

## 2018-03-12 DIAGNOSIS — K625 Hemorrhage of anus and rectum: Secondary | ICD-10-CM | POA: Diagnosis present

## 2018-03-12 DIAGNOSIS — K921 Melena: Secondary | ICD-10-CM | POA: Diagnosis not present

## 2018-03-12 DIAGNOSIS — J449 Chronic obstructive pulmonary disease, unspecified: Secondary | ICD-10-CM | POA: Diagnosis not present

## 2018-03-12 DIAGNOSIS — Z87891 Personal history of nicotine dependence: Secondary | ICD-10-CM | POA: Diagnosis not present

## 2018-03-12 DIAGNOSIS — K5900 Constipation, unspecified: Secondary | ICD-10-CM | POA: Diagnosis not present

## 2018-03-12 LAB — URINALYSIS, COMPLETE (UACMP) WITH MICROSCOPIC
BACTERIA UA: NONE SEEN
Bilirubin Urine: NEGATIVE
GLUCOSE, UA: NEGATIVE mg/dL
Hgb urine dipstick: NEGATIVE
Ketones, ur: NEGATIVE mg/dL
LEUKOCYTES UA: NEGATIVE
Nitrite: NEGATIVE
PH: 5 (ref 5.0–8.0)
Protein, ur: NEGATIVE mg/dL
SPECIFIC GRAVITY, URINE: 1.02 (ref 1.005–1.030)
SQUAMOUS EPITHELIAL / LPF: NONE SEEN (ref 0–5)

## 2018-03-12 LAB — CBC
HEMATOCRIT: 40 % (ref 36.0–46.0)
Hemoglobin: 12.3 g/dL (ref 12.0–15.0)
MCH: 25.9 pg — ABNORMAL LOW (ref 26.0–34.0)
MCHC: 30.8 g/dL (ref 30.0–36.0)
MCV: 84.4 fL (ref 80.0–100.0)
NRBC: 0 % (ref 0.0–0.2)
Platelets: 313 10*3/uL (ref 150–400)
RBC: 4.74 MIL/uL (ref 3.87–5.11)
RDW: 17 % — AB (ref 11.5–15.5)
WBC: 12.2 10*3/uL — AB (ref 4.0–10.5)

## 2018-03-12 LAB — COMPREHENSIVE METABOLIC PANEL
ALT: 14 U/L (ref 0–44)
AST: 24 U/L (ref 15–41)
Albumin: 3.5 g/dL (ref 3.5–5.0)
Alkaline Phosphatase: 60 U/L (ref 38–126)
Anion gap: 13 (ref 5–15)
BUN: 21 mg/dL (ref 8–23)
CHLORIDE: 101 mmol/L (ref 98–111)
CO2: 26 mmol/L (ref 22–32)
CREATININE: 1.2 mg/dL — AB (ref 0.44–1.00)
Calcium: 9.4 mg/dL (ref 8.9–10.3)
GFR, EST AFRICAN AMERICAN: 50 mL/min — AB (ref >60)
GFR, EST NON AFRICAN AMERICAN: 43 mL/min — AB (ref >60)
Glucose, Bld: 130 mg/dL — ABNORMAL HIGH (ref 70–99)
POTASSIUM: 2.9 mmol/L — AB (ref 3.5–5.1)
Sodium: 140 mmol/L (ref 135–145)
Total Bilirubin: 0.7 mg/dL (ref 0.3–1.2)
Total Protein: 7.3 g/dL (ref 6.5–8.1)

## 2018-03-12 LAB — TYPE AND SCREEN
ABO/RH(D): O POS
ANTIBODY SCREEN: NEGATIVE

## 2018-03-12 MED ORDER — PENTAFLUOROPROP-TETRAFLUOROETH EX AERO
INHALATION_SPRAY | CUTANEOUS | Status: AC
  Filled 2018-03-12: qty 30

## 2018-03-12 MED ORDER — HEPARIN SOD (PORK) LOCK FLUSH 100 UNIT/ML IV SOLN
INTRAVENOUS | Status: AC
  Filled 2018-03-12: qty 5

## 2018-03-12 MED ORDER — GLYCERIN (LAXATIVE) 2.1 G RE SUPP
1.0000 | Freq: Once | RECTAL | Status: AC
  Administered 2018-03-12: 1 via RECTAL
  Filled 2018-03-12: qty 1

## 2018-03-12 MED ORDER — POTASSIUM CHLORIDE CRYS ER 20 MEQ PO TBCR
40.0000 meq | EXTENDED_RELEASE_TABLET | Freq: Once | ORAL | Status: AC
  Administered 2018-03-12: 40 meq via ORAL
  Filled 2018-03-12: qty 2

## 2018-03-12 MED ORDER — LACTULOSE 10 GM/15ML PO SOLN
20.0000 g | Freq: Once | ORAL | Status: AC
  Administered 2018-03-12: 20 g via ORAL
  Filled 2018-03-12: qty 30

## 2018-03-12 MED ORDER — SODIUM CHLORIDE 0.9 % IV BOLUS
1000.0000 mL | Freq: Once | INTRAVENOUS | Status: AC
  Administered 2018-03-12: 1000 mL via INTRAVENOUS

## 2018-03-12 MED ORDER — POTASSIUM CHLORIDE 10 MEQ/100ML IV SOLN
10.0000 meq | Freq: Once | INTRAVENOUS | Status: AC
  Administered 2018-03-12: 10 meq via INTRAVENOUS
  Filled 2018-03-12: qty 100

## 2018-03-12 NOTE — ED Provider Notes (Addendum)
[facility] Emergency Department Provider Note  ____________________________________________  Time seen: Approximately 2:30 PM  I have reviewed the triage vital signs and the nursing notes.   HISTORY  Chief Complaint Rectal Bleeding    HPI Krista Cisneros is a 77 y.o. female with a history of breast cancer, now in remission, HTN, HL, not anticoagulated, presenting for urinary retention and rectal bleeding.  Patient reports that she has not had any urination at all since Saturday; she does have associated suprapubic fullness.  She has not had any systemic illness including fevers or chills, nausea or vomiting.  In addition, today she also noted a small amount of bright red blood in the toilet when she was straining to have a bowel movement but was unsuccessful in having a bowel movement.  She has been using over-the-counter laxatives and pushing hard but has been on successful and having a bowel movement.  She is not having any abdominal pain, nausea or vomiting.  No lightheadedness or shortness of breath.  Past Medical History:  Diagnosis Date  . Anxiety   . Arthritis   . Cancer [facility])    Right Breast Cancer  . COPD (chronic obstructive pulmonary disease) ([place])   . Depression   . Dyspnea    with exertion  . GERD (gastroesophageal reflux disease)   . Hyperlipidemia   . Hypertension   . Personal history of chemotherapy   . Personal history of radiation therapy     Patient Active Problem List   Diagnosis Date Noted  . Malignant neoplasm of upper-outer quadrant of right breast in female, estrogen receptor negative ([place]) 06/19/2017  . Incarcerated hernia 03/26/2017  . Incarcerated inguinal hernia   . Acute kidney injury ([place]) 02/07/2017  . Hypomagnesemia 01/30/2017  . Goals of care, counseling/discussion 10/20/2016  . Malignant neoplasm of right female breast ([place]) 10/16/2016  . Hyperlipidemia, mixed 10/08/2016  . Chronic venous insufficiency  09/14/2016  . GERD (gastroesophageal reflux disease) 09/14/2016  . Pain in limb 05/29/2016  . Essential hypertension 05/29/2016  . COPD (chronic obstructive pulmonary disease) ([place]) 05/29/2016  . Hardening of the aorta (main artery of the heart) ([place]) 01/07/2016  . Hyperglycemia, unspecified 01/07/2016  . Neuropathy 11/15/2015  . Severe recurrent major depression without psychotic features ([place]) 09/10/2015  . Allergic rhinitis 04/10/2014  . Depression 04/10/2014  . Obesity, unspecified 04/10/2014    Past Surgical History:  Procedure Laterality Date  . ABDOMINAL HYSTERECTOMY  1990   Partial  . BREAST BIOPSY Right 10/04/2016   axilla lymph node and axillay tail mass biopsy. invasive mammary carcinoma  . BREAST LUMPECTOMY Right 03/21/2017  . BREAST LUMPECTOMY Right 05/21/2017   re-excision  . BREAST LUMPECTOMY WITH NEEDLE LOCALIZATION Right 03/21/2017   Procedure: BREAST LUMPECTOMY WITH NEEDLE LOCALIZATION;  Surgeon: Clayburn Pert, MD;  Location: [facility] ORS;  Service: General;  Laterality: Right;  . DILATION AND CURETTAGE OF UTERUS    . INGUINAL HERNIA REPAIR Right 03/26/2017   Procedure: HERNIA REPAIR INGUINAL INCARCERATED;  Surgeon: Jules Husbands, MD;  Location: [facility] ORS;  Service: General;  Laterality: Right;  . PORTACATH PLACEMENT Left 10/24/2016   Procedure: INSERTION PORT-A-CATH;  Surgeon: [name], MD;  Location: [facility] ORS;  Service: General;  Laterality: Left;  . RE-EXCISION OF BREAST LUMPECTOMY Right 05/21/2017   Procedure: RE-EXCISION OF BREAST LUMPECTOMY;  Surgeon: Clayburn Pert, MD;  Location: [facility] ORS;  Service: General;  Laterality: Right;  . SENTINEL NODE BIOPSY Right 03/21/2017   Procedure: SENTINEL NODE BIOPSY;  Surgeon:  Clayburn Pert, MD;  Location: [facility] ORS;  Service: General;  Laterality: Right;    Current Outpatient Rx  . Order #: [id number] Class: Historical Med  . Order #: [id number] Class: Historical Med  . Order #: [id number] Class: Historical Med  .  Order #: [id number] Class: Historical Med  . Order #: [id number] Class: Historical Med  . Order #: [id number] Class: Historical Med  . Order #: [id number] Class: Print  . Order #: [id number] Class: Historical Med  . Order #: [id number] Class: Historical Med  . Order #: [id number] Class: Normal  . Order #: [id number] Class: Normal  . Order #: [id number] Class: Historical Med  . Order #: [id number] Class: Historical Med  . Order #: [id number] Class: Historical Med  . Order #: [id number] Class: Historical Med  . Order #: [id number] Class: Historical Med  . Order #: [id number] Class: Historical Med  . Order #: [id number] Class: Historical Med  . Order #: [id number] Class: Print  . Order #: [id number] Class: Normal  . Order #: [id number] Class: Historical Med  . Order #: [id number] Class: Normal    Allergies Penicillins; Atorvastatin; Gabapentin; and Ezetimibe  Family History  Problem Relation Age of Onset  . Colon cancer Mother   . Breast cancer Neg Hx     Social History Social History   Tobacco Use  . Smoking status: Former Smoker    Packs/day: 0.50    Types: Cigarettes    Last attempt to quit: 07/23/1988    Years since quitting: 29.6  . Smokeless tobacco: Never Used  Substance Use Topics  . Alcohol use: No  . Drug use: No    Review of Systems Constitutional: No fever/chills.  No lightheadedness or shortness of breath.  No syncope. Eyes: No visual changes. ENT: No sore throat. No congestion or rhinorrhea. Cardiovascular: Denies chest pain. Denies palpitations. Respiratory: Denies shortness of breath.  No cough. Gastrointestinal: No abdominal pain.  No nausea, no vomiting.  No diarrhea.  Positive constipation.  Positive bright red blood per rectum. Genitourinary: Negative for dysuria.  Positive urinary retention. Musculoskeletal: Negative for back pain. Skin: Negative for rash. Neurological: Negative for headaches. No focal numbness, tingling or weakness.      ____________________________________________   PHYSICAL EXAM:  VITAL SIGNS: ED Triage Vitals  Enc Vitals Group     BP 03/12/18 1303 (!) 161/73     Pulse Rate 03/12/18 1303 86     Resp 03/12/18 1303 16     Temp 03/12/18 1303 98.1 F (36.7 C)     Temp Source 03/12/18 1303 Oral     SpO2 03/12/18 1303 98 %     Weight --      Height 03/12/18 1304 5\' 2"  (1.575 m)     Head Circumference --      Peak Flow --      Pain Score 03/12/18 1303 8     Pain Loc --      Pain Edu? --      Excl. in [place]? --     Constitutional: Alert and oriented. Answers questions appropriately.  Chronically ill-appearing. Eyes: Conjunctivae are normal.  EOMI. No scleral icterus. Head: Atraumatic. Nose: No congestion/rhinnorhea. Mouth/Throat: Mucous membranes are moist.  Neck: No stridor.  Supple.   Cardiovascular: Normal rate, regular rhythm. No murmurs, rubs or gallops.  Respiratory: Normal respiratory effort.  No accessory muscle use or retractions. Lungs CTAB.  No wheezes, rales or ronchi. Gastrointestinal: Soft, nontender and nondistended.  No guarding or rebound.  No peritoneal signs. GU: The patient has no evidence of external or palpable internal hemorrhoids.  She does have erythema around  the rectum that is tender to palpation.  She has no evidence of stool ball but has liquid brown stool that is guaiac negative. In addition, the patient has a Foley catheter in place with clear urine. Musculoskeletal: No LE edema.  Neurologic:  A&Ox3.  Speech is clear.  Face and smile are symmetric.  EOMI.  Moves all extremities well. Skin:  Skin is warm, dry and intact. No rash noted. Psychiatric: Mood and affect are normal. Speech and behavior are normal.  Normal judgement.  ____________________________________________   LABS (all labs ordered are listed, but only abnormal results are displayed)  Labs Reviewed  COMPREHENSIVE METABOLIC PANEL - Abnormal; Notable for the following components:      Result  Value   Potassium 2.9 (*)    Glucose, Bld 130 (*)    Creatinine, Ser 1.20 (*)    GFR calc non Af Amer 43 (*)    GFR calc Af Amer 50 (*)    All other components within normal limits  CBC - Abnormal; Notable for the following components:   WBC 12.2 (*)    MCH 25.9 (*)    RDW 17.0 (*)    All other components within normal limits  URINALYSIS, COMPLETE (UACMP) WITH MICROSCOPIC  POC OCCULT BLOOD, ED  TYPE AND SCREEN   ____________________________________________  EKG  ED ECG REPORT I, Anne-Caroline Mariea Clonts, the attending physician, personally viewed and interpreted this ECG.   Date: 03/12/2018  EKG Time: 1412  Rate: 68  Rhythm: normal sinus rhythm  Axis: normal  Intervals:none  ST&T Change: No STEMI  ____________________________________________  RADIOLOGY  No results found.  ____________________________________________   PROCEDURES  Procedure(s) performed: None  Procedures  Critical Care performed: No ____________________________________________   INITIAL IMPRESSION / ASSESSMENT AND PLAN / ED COURSE  Pertinent labs & imaging results that were available during my care of the patient were reviewed by me and considered in my medical decision making (see chart for details).  77 y.o. female, not anticoagulated, presenting with new onset urinary retention for the past 4 days, as well as bright red blood per rectum today with straining associated with constipation.  Overall, the patient is mildly hypertensive but afebrile.  Her abdominal exam does not show any acute abnormalities.  She is feeling significantly better after Foley catheter has been placed and urinalysis is pending.  My examination does not show any evidence of rectal bleeding.  He is hemodynamically stable and has a hemoglobin of 12.3 and a hematocrit of 40; there is no evidence of active bleeding.  The patient has liquid stool which is likely from her laxative and may have a stool burden which is higher than  what I can reach.  Will treat her with lactulose and a glycerin suppository to see if she is able to have a bowel movement. Plan re-evaluation for final disposition.  ----------------------------------------- 3:49 PM on 03/12/2018 -----------------------------------------  The patient's work-up in the emergency department has been reassuring.  She does have a potassium of 2.9, which will need to be rechecked by her primary care physician; here she will get oral and IV potassium repletion.  She has a rating of 1.20 which is baseline for her.  Her white blood cell count is 12.2 and I am awaiting the results of her UA.  She does not have any signs of active bleeding.  She will need to be discharged home with an indwelling Foley catheter due to urinary retention.  ____________________________________________  FINAL CLINICAL IMPRESSION(S) / ED DIAGNOSES  Final diagnoses:  Urinary retention  Hypokalemia  Black stool  Constipation, unspecified constipation type         NEW MEDICATIONS STARTED DURING THIS VISIT:  New Prescriptions   No medications on file      Eula Listen, MD 03/12/18 1449    Eula Listen, MD 03/12/18 1550

## 2018-03-12 NOTE — ED Notes (Signed)
Lab notified about urinalysis. States they will add now.

## 2018-03-12 NOTE — ED Notes (Signed)
Bladder scan preformed on pt. Scan is showing pt has greater than 969mL of urine in bladder. Shelly Rubenstein was given verbal order for foley to be placed in triage. This tech along with [employer] student will place foley.

## 2018-03-12 NOTE — Discharge Instructions (Addendum)
Please return to the emergency department if you develop bleeding, lightheadedness or fainting, fever, inability to keep down fluids, changes in mental status, or any other symptoms concerning to you.  Please make a follow-up appointment with your primary care physician for reevaluation.  Her primary care doctor will recheck your potassium level.  These make an appointment with the urology doctors for reevaluation of your urinary retention Foley catheter will stay in place until they remove it.

## 2018-03-12 NOTE — ED Triage Notes (Signed)
Pt was seen by PCP who directed pt to come to ED to be evaluated for rectal bleeding. Pt has been constipated and has been using OTC laxatives and this morning pt noticed bright red blood discharge from her rectum. Pt was also found to have urinary retention by PCP.

## 2018-03-12 NOTE — ED Provider Notes (Signed)
-----------------------------------------   4:19 PM on 03/12/2018 -----------------------------------------  Urinalysis is negative.  Patient will be discharged per Dr. Tamala Fothergill instructions.   Harvest Dark, MD 03/12/18 [phone]

## 2018-03-12 NOTE — ED Notes (Signed)
Leg bag attached to pt. Pt instructed on how to use.

## 2018-03-22 DIAGNOSIS — K625 Hemorrhage of anus and rectum: Secondary | ICD-10-CM | POA: Insufficient documentation

## 2018-04-02 ENCOUNTER — Inpatient Hospital Stay: Payer: Medicare PPO | Attending: Oncology

## 2018-04-02 DIAGNOSIS — Z452 Encounter for adjustment and management of vascular access device: Secondary | ICD-10-CM | POA: Insufficient documentation

## 2018-04-02 DIAGNOSIS — Z95828 Presence of other vascular implants and grafts: Secondary | ICD-10-CM

## 2018-04-02 DIAGNOSIS — C50411 Malignant neoplasm of upper-outer quadrant of right female breast: Secondary | ICD-10-CM | POA: Insufficient documentation

## 2018-04-02 MED ORDER — HEPARIN SOD (PORK) LOCK FLUSH 100 UNIT/ML IV SOLN
500.0000 [IU] | Freq: Once | INTRAVENOUS | Status: AC
  Administered 2018-04-02: 500 [IU] via INTRAVENOUS

## 2018-04-02 MED ORDER — HEPARIN SOD (PORK) LOCK FLUSH 100 UNIT/ML IV SOLN
INTRAVENOUS | Status: AC
  Filled 2018-04-02: qty 5

## 2018-04-02 MED ORDER — SODIUM CHLORIDE 0.9% FLUSH
10.0000 mL | Freq: Once | INTRAVENOUS | Status: AC
  Administered 2018-04-02: 10 mL via INTRAVENOUS
  Filled 2018-04-02: qty 10

## 2018-04-05 ENCOUNTER — Ambulatory Visit: Payer: Medicare PPO | Admitting: Urology

## 2018-04-08 ENCOUNTER — Ambulatory Visit: Payer: Medicare PPO | Admitting: Urology

## 2018-04-08 ENCOUNTER — Encounter: Payer: Self-pay | Admitting: Urology

## 2018-04-08 VITALS — BP 105/67 | HR 85 | Ht 62.0 in | Wt 147.9 lb

## 2018-04-08 DIAGNOSIS — N200 Calculus of kidney: Secondary | ICD-10-CM | POA: Diagnosis not present

## 2018-04-08 DIAGNOSIS — R339 Retention of urine, unspecified: Secondary | ICD-10-CM | POA: Diagnosis not present

## 2018-04-08 LAB — BLADDER SCAN AMB NON-IMAGING

## 2018-04-08 NOTE — Progress Notes (Signed)
04/08/2018  9:21 AM   Krista Cisneros 08/05/1940 [id number]  Referring provider: Sharyne Peach, MD [place], [place] [address]  Chief Complaint  Patient presents with  . Urinary Retention    Urological History  - Went to the ED (03/12/2018) for urinary retention; Hospitalized (03/14/2018); Foley placed following +918ml PVR  - CT (03/15/2018) revealed left sided nephrolithiasis w/o obstructive uropathy  - UA (03/26/2018); Mixed flora, probable contamination  - Foley removed the week of (03/31/2018) by home health nurse. Symptoms have returned to baseline.   HPI: Krista Cisneros is a 77 y.o. female presenting today, with her daughter, for an Emergency Dept follow up  - Denies prior cases or urinary retention  - Reports constipation and urination were concurrent and as one resolved so did the other  - Foley removed last week by home health nurse. Pt reports urinary symptoms returning to baseline  - Reports leakage with coughing, laughing, sneezing  - Denies dysuria, gross hematuria or flank/abdominal/pelvic pain.  - PVR today was 36ml  PMH: Past Medical History:  Diagnosis Date  . Anxiety   . Arthritis   . Cancer [facility])    Right Breast Cancer  . COPD (chronic obstructive pulmonary disease) ([place])   . Depression   . Dyspnea    with exertion  . GERD (gastroesophageal reflux disease)   . Hyperlipidemia   . Hypertension   . Personal history of chemotherapy   . Personal history of radiation therapy    Surgical History: Past Surgical History:  Procedure Laterality Date  . ABDOMINAL HYSTERECTOMY  1990   Partial  . BREAST BIOPSY Right 10/04/2016   axilla lymph node and axillay tail mass biopsy. invasive mammary carcinoma  . BREAST LUMPECTOMY Right 03/21/2017  . BREAST LUMPECTOMY Right 05/21/2017   re-excision  . BREAST LUMPECTOMY WITH NEEDLE LOCALIZATION Right 03/21/2017   Procedure: BREAST LUMPECTOMY WITH NEEDLE LOCALIZATION;  Surgeon: Clayburn Pert, MD;  Location: [facility] ORS;  Service: General;  Laterality: Right;  . DILATION AND CURETTAGE OF UTERUS    . INGUINAL HERNIA REPAIR Right 03/26/2017   Procedure: HERNIA REPAIR INGUINAL INCARCERATED;  Surgeon: Jules Husbands, MD;  Location: [facility] ORS;  Service: General;  Laterality: Right;  . PORTACATH PLACEMENT Left 10/24/2016   Procedure: INSERTION PORT-A-CATH;  Surgeon: [name], MD;  Location: [facility] ORS;  Service: General;  Laterality: Left;  . RE-EXCISION OF BREAST LUMPECTOMY Right 05/21/2017   Procedure: RE-EXCISION OF BREAST LUMPECTOMY;  Surgeon: Clayburn Pert, MD;  Location: [facility] ORS;  Service: General;  Laterality: Right;  . SENTINEL NODE BIOPSY Right 03/21/2017   Procedure: SENTINEL NODE BIOPSY;  Surgeon: Clayburn Pert, MD;  Location: [facility] ORS;  Service: General;  Laterality: Right;   Home Medications:  Allergies as of 04/08/2018      Reactions   Aspirin    Other reaction(s): Other (See Comments) GI bleeding risk   Nsaids    Other reaction(s): Other (See Comments) Bleeding risk   Penicillins Anaphylaxis, Swelling, Rash   Has patient had a PCN reaction causing immediate rash, facial/tongue/throat swelling, SOB or lightheadedness with hypotension: Yes Has patient had a PCN reaction causing severe rash involving mucus membranes or skin necrosis: No Has patient had a PCN reaction that required hospitalization: No  Has patient had a PCN reaction occurring within the last 10 years: No If all of the above answers are "NO", then may proceed with Cephalosporin use.   Atorvastatin Other (See Comments)   Muscle Pain  Gabapentin Swelling   Ezetimibe Other (See Comments)   Muscle pain      Medication List       Accurate as of April 08, 2018  9:21 AM. Always use your most recent med list.        ADVAIR DISKUS 500-50 MCG/DOSE Aepb Generic drug:  Fluticasone-Salmeterol Inhale 1 puff into the lungs 2 (two) times daily.   aspirin EC 81 MG tablet Take 81 mg by mouth  daily.   baclofen 10 MG tablet Commonly known as:  LIORESAL Take 10 mg by mouth at bedtime.   BAYER BACK & BODY PAIN EX ST PO Take 2 tablets by mouth 2 (two) times daily as needed (for back pain).   benzonatate 200 MG capsule Commonly known as:  TESSALON Take 200 mg by mouth 3 (three) times daily as needed. For cough   co-enzyme Q-10 30 MG capsule Take 30 mg by mouth daily.   diphenoxylate-atropine 2.5-0.025 MG tablet Commonly known as:  LOMOTIL Take 1 tablet by mouth 4 (four) times daily as needed for diarrhea or loose stools.   DULoxetine 60 MG capsule Commonly known as:  CYMBALTA Take 60 mg by mouth every morning. Take with 30 mg to total 90 mg once daily   DULoxetine 30 MG capsule Commonly known as:  CYMBALTA Take 30 mg by mouth every morning. Take with 60 mg to total 90 mg once daily   Fish Oil 1000 MG Caps Take 1,000 mg by mouth daily.   letrozole 2.5 MG tablet Commonly known as:  FEMARA Take 1 tablet (2.5 mg total) by mouth daily.   lidocaine-prilocaine cream Commonly known as:  EMLA Apply 1 application as needed topically (for port access).   losartan-hydrochlorothiazide 100-25 MG tablet Commonly known as:  HYZAAR Take 1 tablet by mouth daily.   magnesium 30 MG tablet Take 30 mg by mouth 2 (two) times daily.   Melatonin 5 MG Tabs Take 10 mg by mouth at bedtime as needed (sleep).   montelukast 10 MG tablet Commonly known as:  SINGULAIR Take 10 mg by mouth at bedtime.   omeprazole 20 MG capsule Commonly known as:  PRILOSEC Take 20 mg by mouth every morning.   oxyCODONE 5 MG immediate release tablet Commonly known as:  ROXICODONE Take 1 tablet (5 mg total) by mouth every 4 (four) hours as needed for moderate pain or severe pain.   potassium chloride SA 20 MEQ tablet Commonly known as:  K-DUR,KLOR-CON Take 2 tablets (40 mEq total) by mouth daily. This is in addition to ur regular dose of 58meq daily   KLOR-CON M20 20 MEQ tablet Generic drug:   potassium chloride SA TAKE 1 TABLET BY MOUTH EVERY DAY   promethazine 25 MG tablet Commonly known as:  PHENERGAN TAKE 25 MG BY MOUTH EVERY 6-8 HOURS AS NEEDED FOR NAUSEA   triamcinolone ointment 0.5 % Commonly known as:  KENALOG Apply 1 application topically 2 (two) times daily. Rash on bilateral upper extremities      Allergies:  Allergies  Allergen Reactions  . Aspirin     Other reaction(s): Other (See Comments) GI bleeding risk  . Nsaids     Other reaction(s): Other (See Comments) Bleeding risk  . Penicillins Anaphylaxis, Swelling and Rash    Has patient had a PCN reaction causing immediate rash, facial/tongue/throat swelling, SOB or lightheadedness with hypotension: Yes Has patient had a PCN reaction causing severe rash involving mucus membranes or skin necrosis: No Has patient had a PCN  reaction that required hospitalization: No  Has patient had a PCN reaction occurring within the last 10 years: No If all of the above answers are "NO", then may proceed with Cephalosporin use.   . Atorvastatin Other (See Comments)    Muscle Pain  . Gabapentin Swelling  . Ezetimibe Other (See Comments)    Muscle pain    Family History: Family History  Problem Relation Age of Onset  . Colon cancer Mother   . Breast cancer Neg Hx     Social History:  reports that she quit smoking about 29 years ago. Her smoking use included cigarettes. She smoked 0.50 packs per day. She has never used smokeless tobacco. She reports that she does not drink alcohol or use drugs.  ROS: UROLOGY Frequent Urination?: No Hard to postpone urination?: No Burning/pain with urination?: No Get up at night to urinate?: Yes Leakage of urine?: Yes Urine stream starts and stops?: No Trouble starting stream?: No Do you have to strain to urinate?: No Blood in urine?: No Urinary tract infection?: No Sexually transmitted disease?: No Injury to kidneys or bladder?: No Painful intercourse?: No Weak stream?:  No Currently pregnant?: No Vaginal bleeding?: No Last menstrual period?: Hysterectomy  Gastrointestinal Nausea?: No Vomiting?: No Indigestion/heartburn?: Yes Diarrhea?: No Constipation?: Yes  Constitutional Fever: No Night sweats?: Yes Weight loss?: No Fatigue?: No  Skin Skin rash/lesions?: No Itching?: No  Eyes Blurred vision?: No Double vision?: No  Ears/Nose/Throat Sore throat?: No Sinus problems?: No  Hematologic/Lymphatic Swollen glands?: No Easy bruising?: No  Cardiovascular Leg swelling?: No Chest pain?: Yes  Respiratory Cough?: No Shortness of breath?: Yes  Endocrine Excessive thirst?: No  Musculoskeletal Back pain?: No Joint pain?: No  Neurological Headaches?: No Dizziness?: No  Psychologic Depression?: Yes Anxiety?: No  Physical Exam: BP 105/67 (BP Location: Left Arm, Patient Position: Sitting, Cuff Size: Normal)   Pulse 85   Ht 5\' 2"  (1.575 m)   Wt 147 lb 14.4 oz (67.1 kg)   BMI 27.05 kg/m   Constitutional:  Alert and oriented, No acute distress. Respiratory: Normal respiratory effort, no increased work of breathing. GU: No CVA tenderness Skin: No rashes, bruises or suspicious lesions. Neurologic: Grossly intact, no focal deficits, moving all 4 extremities. Psychiatric: Normal mood and affect.  Laboratory Data: Lab Results  Component Value Date   WBC 12.2 (H) 03/12/2018   HGB 12.3 03/12/2018   HCT 40.0 03/12/2018   MCV 84.4 03/12/2018   PLT 313 03/12/2018   Lab Results  Component Value Date   CREATININE 1.20 (H) 03/12/2018   Urinalysis    Component Value Date/Time   COLORURINE YELLOW (A) 03/12/2018 1359   APPEARANCEUR CLEAR (A) 03/12/2018 1359   LABSPEC 1.020 03/12/2018 1359   PHURINE 5.0 03/12/2018 1359   GLUCOSEU NEGATIVE 03/12/2018 1359   HGBUR NEGATIVE 03/12/2018 1359   BILIRUBINUR NEGATIVE 03/12/2018 1359   KETONESUR NEGATIVE 03/12/2018 1359   PROTEINUR NEGATIVE 03/12/2018 1359   NITRITE NEGATIVE  03/12/2018 1359   LEUKOCYTESUR NEGATIVE 03/12/2018 1359    Lab Results  Component Value Date   BACTERIA NONE SEEN 03/12/2018    Pertinent Imaging: CT ABDOMEN PELVIS WITHOUT CONTRAST  Comparison: None  Indication: Abdominal pain, acute, nonlocalized, concern for partial colonic obstruction, K62.5 Hemorrhage of anus and rectum  Technique: Contiguous axial images were obtained from the level of the diaphragm to the pubic symphysis without intravenous or oral contrast. Coronal reformatted images were generated and reviewed to assist with anatomic localization and lesion detection. Dose reduction  was obtained with either Automated Exposure Control (AEC) or, if AEC could not be utilized, by adjustment of the mA and/or kV according to patient size. Oral contrast was given, 30 mL Gastrografin.  Findings: There is mild dependent atelectasis and scarring in the lung bases as well as emphysema. There are blebs in the right lower lobe as well, one of which contains small amount of fluid raising possibility of infection and is only partially visualized. A large hiatal hernia is also noted.  Evaluation of the viscera is limited without intravenous contrast. The unenhanced liver, gallbladder, pancreas, spleen, and adrenal glands are unremarkable.   The unenhanced kidneys are unremarkable without hydronephrosis or perinephric stranding. Small nonobstructing renal stone on the left. Right renal vascular calcification. The urinary bladder is collapsed with Foley catheter.  Small bowel loops are nondilated and there is no small bowel wall thickening. Oral contrast has passed into the right colon. There is a large stool burden throughout the right and transverse colon. The descending colon is fairly collapsed. The rectosigmoid colon is also fairly collapsed with only a small amount of stool. The rectum demonstrates pronounced wall thickening and perirectal soft tissue stranding of the  adipose tissues. No fluid or gas collections identified. No foreign body identified. Findings consistent with proctitis. Rectal malignancy is a differential consideration. No pelvic mass or adenopathy identified.  No aortic aneurysm. IVC unremarkable.  No abdominal or pelvic lymphadenopathy. No aggressive appearing osseous lesions. 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  Impression: 1. Pronounced rectal wall thickening with perirectal and presacral stranding. Correlate for proctitis versus less likely rectal malignancy. No evidence of perforation or abscess. There is constipation without obstruction. 2. Emphysema and right lower lobe pulmonary blebs one of which contains a small amount of fluid partially visualized. 3. Left-sided nephrolithiasis without obstructive uropathy.  Appropriate preliminary report was given by [facility] at 4:25 AM on 03/15/2018.  Electronically Signed by: Lars Pinks, MD, [facility] Electronically Signed on: 03/15/2018 11:00 AM  Other Result Information  Interface, Rad Results In - 03/15/2018 11:01 AM EST CT ABDOMEN PELVIS WITHOUT CONTRAST  Comparison: None  Indication: Abdominal pain, acute, nonlocalized, concern for partial colonic obstruction, K62.5 Hemorrhage of anus and rectum  Technique:  Contiguous axial images were obtained from the level of the diaphragm to the pubic symphysis without intravenous or oral contrast. Coronal reformatted images were generated and reviewed to assist with anatomic localization and lesion detection. Dose reduction was obtained with either Automated Exposure Control (AEC) or, if AEC could not be utilized, by adjustment of the mA and/or kV according to patient size. Oral contrast was given, 30 mL Gastrografin.  Findings: There is mild dependent atelectasis and scarring in the lung bases as well as emphysema. There are blebs in the right lower lobe as well, one of which contains  small amount of fluid raising possibility of infection and is only partially visualized. A large hiatal hernia is also noted.  Evaluation of the viscera is limited without intravenous contrast. The unenhanced liver, gallbladder, pancreas, spleen, and adrenal glands are unremarkable.   The unenhanced kidneys are unremarkable without hydronephrosis or perinephric stranding. Small nonobstructing renal stone on the left. Right renal vascular calcification. The urinary bladder is collapsed with Foley catheter.  Small bowel loops are nondilated and there is no small bowel wall thickening. Oral contrast has passed into the right colon. There is a large stool burden throughout the right and transverse colon. The descending colon is fairly collapsed. The rectosigmoid  colon is also fairly collapsed with only a small amount of stool. The rectum demonstrates pronounced wall thickening and perirectal soft tissue stranding of the adipose tissues. No fluid or gas collections identified. No foreign body identified. Findings consistent with proctitis. Rectal malignancy is a differential consideration. No pelvic mass or adenopathy identified.  No aortic aneurysm. IVC unremarkable.  No abdominal or pelvic lymphadenopathy. No aggressive appearing osseous lesions. 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  Impression: 1.  Pronounced rectal wall thickening with perirectal and presacral stranding. Correlate for proctitis versus less likely rectal malignancy. No evidence of perforation or abscess. There is constipation without obstruction. 2.  Emphysema and right lower lobe pulmonary blebs one of which contains a small amount of fluid partially visualized. 3.  Left-sided nephrolithiasis without obstructive uropathy.  Appropriate preliminary report was given by [facility] at 4:25 AM on 03/15/2018.  Electronically Signed by:  Lars Pinks, MD, [facility] Electronically Signed  on:  03/15/2018 11:00 AM   Assessment & Plan:    1. Urinary Retention   - Cath removed last week by a home health nurse  - PVR today was 22ml  - Patient denies lower urinary tract symptoms at this time  2. Stress Incontinence  - Offered PT pelvic floor rehab to assist with stress incontinence however she declines at this time   Abbie Sons, MD  [facility] [address] [place], [place] [address] [phone]  I, Temidayo Atanda-Ogunleye , am acting as a scribe for [employer], MD  I, Abbie Sons, MD, have reviewed all documentation for this visit. The documentation on 04/08/18 for the exam, diagnosis, procedures, and orders are all accurate and complete.

## 2018-04-09 ENCOUNTER — Encounter: Payer: Self-pay | Admitting: Emergency Medicine

## 2018-04-09 ENCOUNTER — Inpatient Hospital Stay
Admission: EM | Admit: 2018-04-09 | Discharge: 2018-04-12 | DRG: 246 | Disposition: A | Discharge status: Home / Self Care | Payer: Medicare PPO | Attending: Internal Medicine | Admitting: Internal Medicine

## 2018-04-09 ENCOUNTER — Other Ambulatory Visit: Payer: Self-pay

## 2018-04-09 ENCOUNTER — Emergency Department: Payer: Medicare PPO

## 2018-04-09 DIAGNOSIS — Z923 Personal history of irradiation: Secondary | ICD-10-CM | POA: Diagnosis not present

## 2018-04-09 DIAGNOSIS — J449 Chronic obstructive pulmonary disease, unspecified: Secondary | ICD-10-CM | POA: Diagnosis present

## 2018-04-09 DIAGNOSIS — J432 Centrilobular emphysema: Secondary | ICD-10-CM | POA: Diagnosis not present

## 2018-04-09 DIAGNOSIS — I5023 Acute on chronic systolic (congestive) heart failure: Secondary | ICD-10-CM | POA: Diagnosis present

## 2018-04-09 DIAGNOSIS — I130 Hypertensive heart and chronic kidney disease with heart failure and stage 1 through stage 4 chronic kidney disease, or unspecified chronic kidney disease: Secondary | ICD-10-CM | POA: Diagnosis present

## 2018-04-09 DIAGNOSIS — Z880 Allergy status to penicillin: Secondary | ICD-10-CM

## 2018-04-09 DIAGNOSIS — N182 Chronic kidney disease, stage 2 (mild): Secondary | ICD-10-CM | POA: Diagnosis present

## 2018-04-09 DIAGNOSIS — Z87891 Personal history of nicotine dependence: Secondary | ICD-10-CM | POA: Diagnosis not present

## 2018-04-09 DIAGNOSIS — I340 Nonrheumatic mitral (valve) insufficiency: Secondary | ICD-10-CM | POA: Diagnosis not present

## 2018-04-09 DIAGNOSIS — Z79899 Other long term (current) drug therapy: Secondary | ICD-10-CM

## 2018-04-09 DIAGNOSIS — E782 Mixed hyperlipidemia: Secondary | ICD-10-CM | POA: Diagnosis present

## 2018-04-09 DIAGNOSIS — K219 Gastro-esophageal reflux disease without esophagitis: Secondary | ICD-10-CM | POA: Diagnosis present

## 2018-04-09 DIAGNOSIS — I2510 Atherosclerotic heart disease of native coronary artery without angina pectoris: Secondary | ICD-10-CM | POA: Diagnosis not present

## 2018-04-09 DIAGNOSIS — I429 Cardiomyopathy, unspecified: Secondary | ICD-10-CM | POA: Diagnosis present

## 2018-04-09 DIAGNOSIS — E876 Hypokalemia: Secondary | ICD-10-CM | POA: Diagnosis present

## 2018-04-09 DIAGNOSIS — F329 Major depressive disorder, single episode, unspecified: Secondary | ICD-10-CM | POA: Diagnosis present

## 2018-04-09 DIAGNOSIS — I214 Non-ST elevation (NSTEMI) myocardial infarction: Principal | ICD-10-CM | POA: Diagnosis present

## 2018-04-09 DIAGNOSIS — Z886 Allergy status to analgesic agent status: Secondary | ICD-10-CM

## 2018-04-09 DIAGNOSIS — I25110 Atherosclerotic heart disease of native coronary artery with unstable angina pectoris: Secondary | ICD-10-CM | POA: Diagnosis present

## 2018-04-09 DIAGNOSIS — Z9221 Personal history of antineoplastic chemotherapy: Secondary | ICD-10-CM | POA: Diagnosis not present

## 2018-04-09 DIAGNOSIS — C50911 Malignant neoplasm of unspecified site of right female breast: Secondary | ICD-10-CM | POA: Diagnosis present

## 2018-04-09 DIAGNOSIS — Z7982 Long term (current) use of aspirin: Secondary | ICD-10-CM

## 2018-04-09 DIAGNOSIS — Z7951 Long term (current) use of inhaled steroids: Secondary | ICD-10-CM | POA: Diagnosis not present

## 2018-04-09 DIAGNOSIS — Z853 Personal history of malignant neoplasm of breast: Secondary | ICD-10-CM

## 2018-04-09 DIAGNOSIS — I10 Essential (primary) hypertension: Secondary | ICD-10-CM | POA: Diagnosis present

## 2018-04-09 DIAGNOSIS — Z888 Allergy status to other drugs, medicaments and biological substances status: Secondary | ICD-10-CM

## 2018-04-09 DIAGNOSIS — C50411 Malignant neoplasm of upper-outer quadrant of right female breast: Secondary | ICD-10-CM | POA: Diagnosis not present

## 2018-04-09 DIAGNOSIS — I420 Dilated cardiomyopathy: Secondary | ICD-10-CM | POA: Diagnosis not present

## 2018-04-09 DIAGNOSIS — Z171 Estrogen receptor negative status [ER-]: Secondary | ICD-10-CM | POA: Diagnosis not present

## 2018-04-09 LAB — BASIC METABOLIC PANEL
ANION GAP: 8 (ref 5–15)
BUN: 20 mg/dL (ref 8–23)
CALCIUM: 8.6 mg/dL — AB (ref 8.9–10.3)
CO2: 23 mmol/L (ref 22–32)
Chloride: 109 mmol/L (ref 98–111)
Creatinine, Ser: 1.2 mg/dL — ABNORMAL HIGH (ref 0.44–1.00)
GFR, EST AFRICAN AMERICAN: 51 mL/min — AB (ref >60)
GFR, EST NON AFRICAN AMERICAN: 44 mL/min — AB (ref >60)
Glucose, Bld: 127 mg/dL — ABNORMAL HIGH (ref 70–99)
POTASSIUM: 3.4 mmol/L — AB (ref 3.5–5.1)
Sodium: 140 mmol/L (ref 135–145)

## 2018-04-09 LAB — CBC
HCT: 34.3 % — ABNORMAL LOW (ref 36.0–46.0)
HEMOGLOBIN: 10.4 g/dL — AB (ref 12.0–15.0)
MCH: 25.9 pg — ABNORMAL LOW (ref 26.0–34.0)
MCHC: 30.3 g/dL (ref 30.0–36.0)
MCV: 85.3 fL (ref 80.0–100.0)
NRBC: 0 % (ref 0.0–0.2)
Platelets: 291 10*3/uL (ref 150–400)
RBC: 4.02 MIL/uL (ref 3.87–5.11)
RDW: 17.7 % — ABNORMAL HIGH (ref 11.5–15.5)
WBC: 10.8 10*3/uL — ABNORMAL HIGH (ref 4.0–10.5)

## 2018-04-09 LAB — TROPONIN I: TROPONIN I: 2.71 ng/mL — AB (ref <0.03)

## 2018-04-09 IMAGING — CR DG CHEST 2V
1 series · 2 of 2 positions shown · non-contrast
Comparison: [DATE]

CLINICAL DATA: Midsternal chest pain for 2 days

EXAM:
CHEST - 2 VIEW

[Series 1: dg chest 2 view · 0.14mm/px · 2 of 2 slices shown]
[im 1/2]
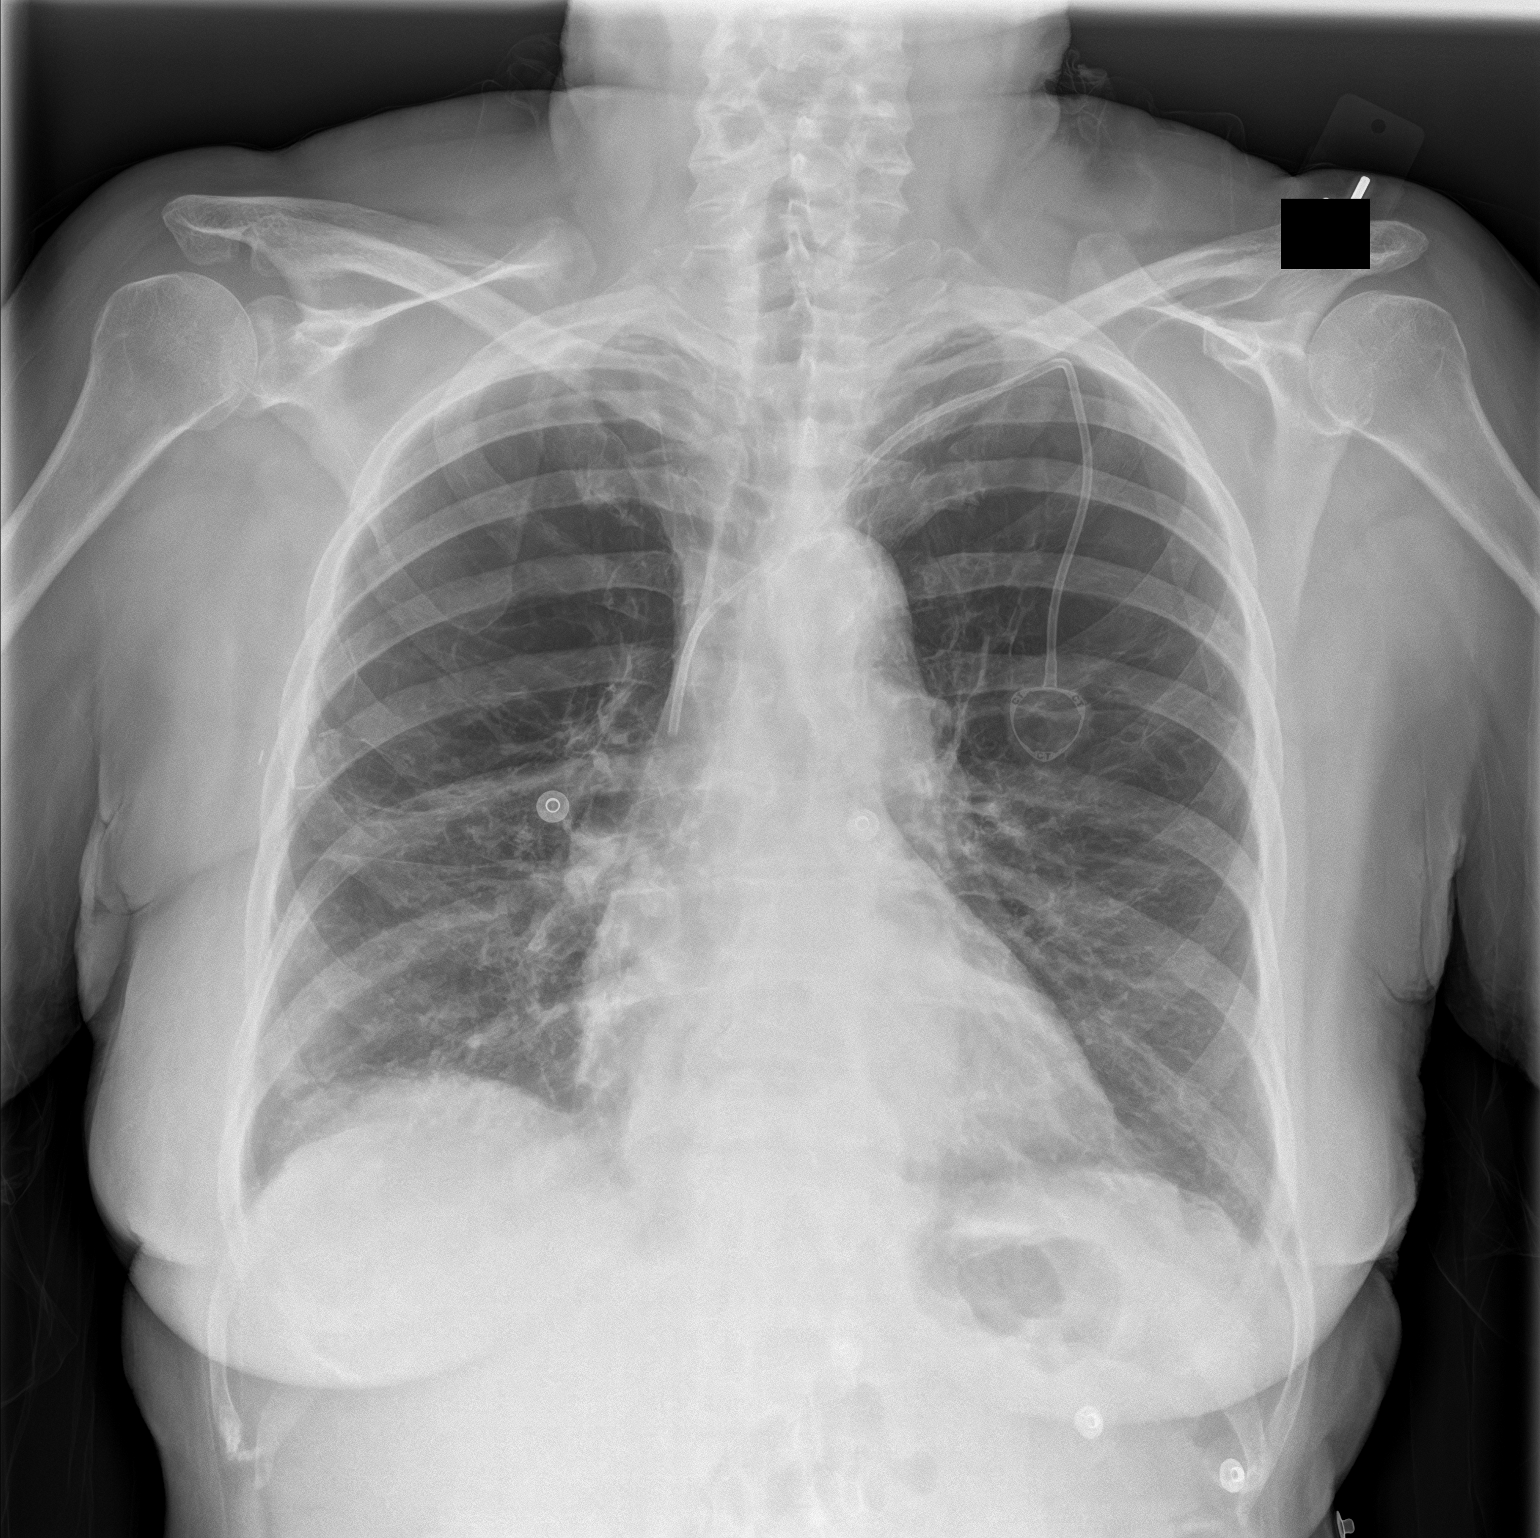
[im 2/2]
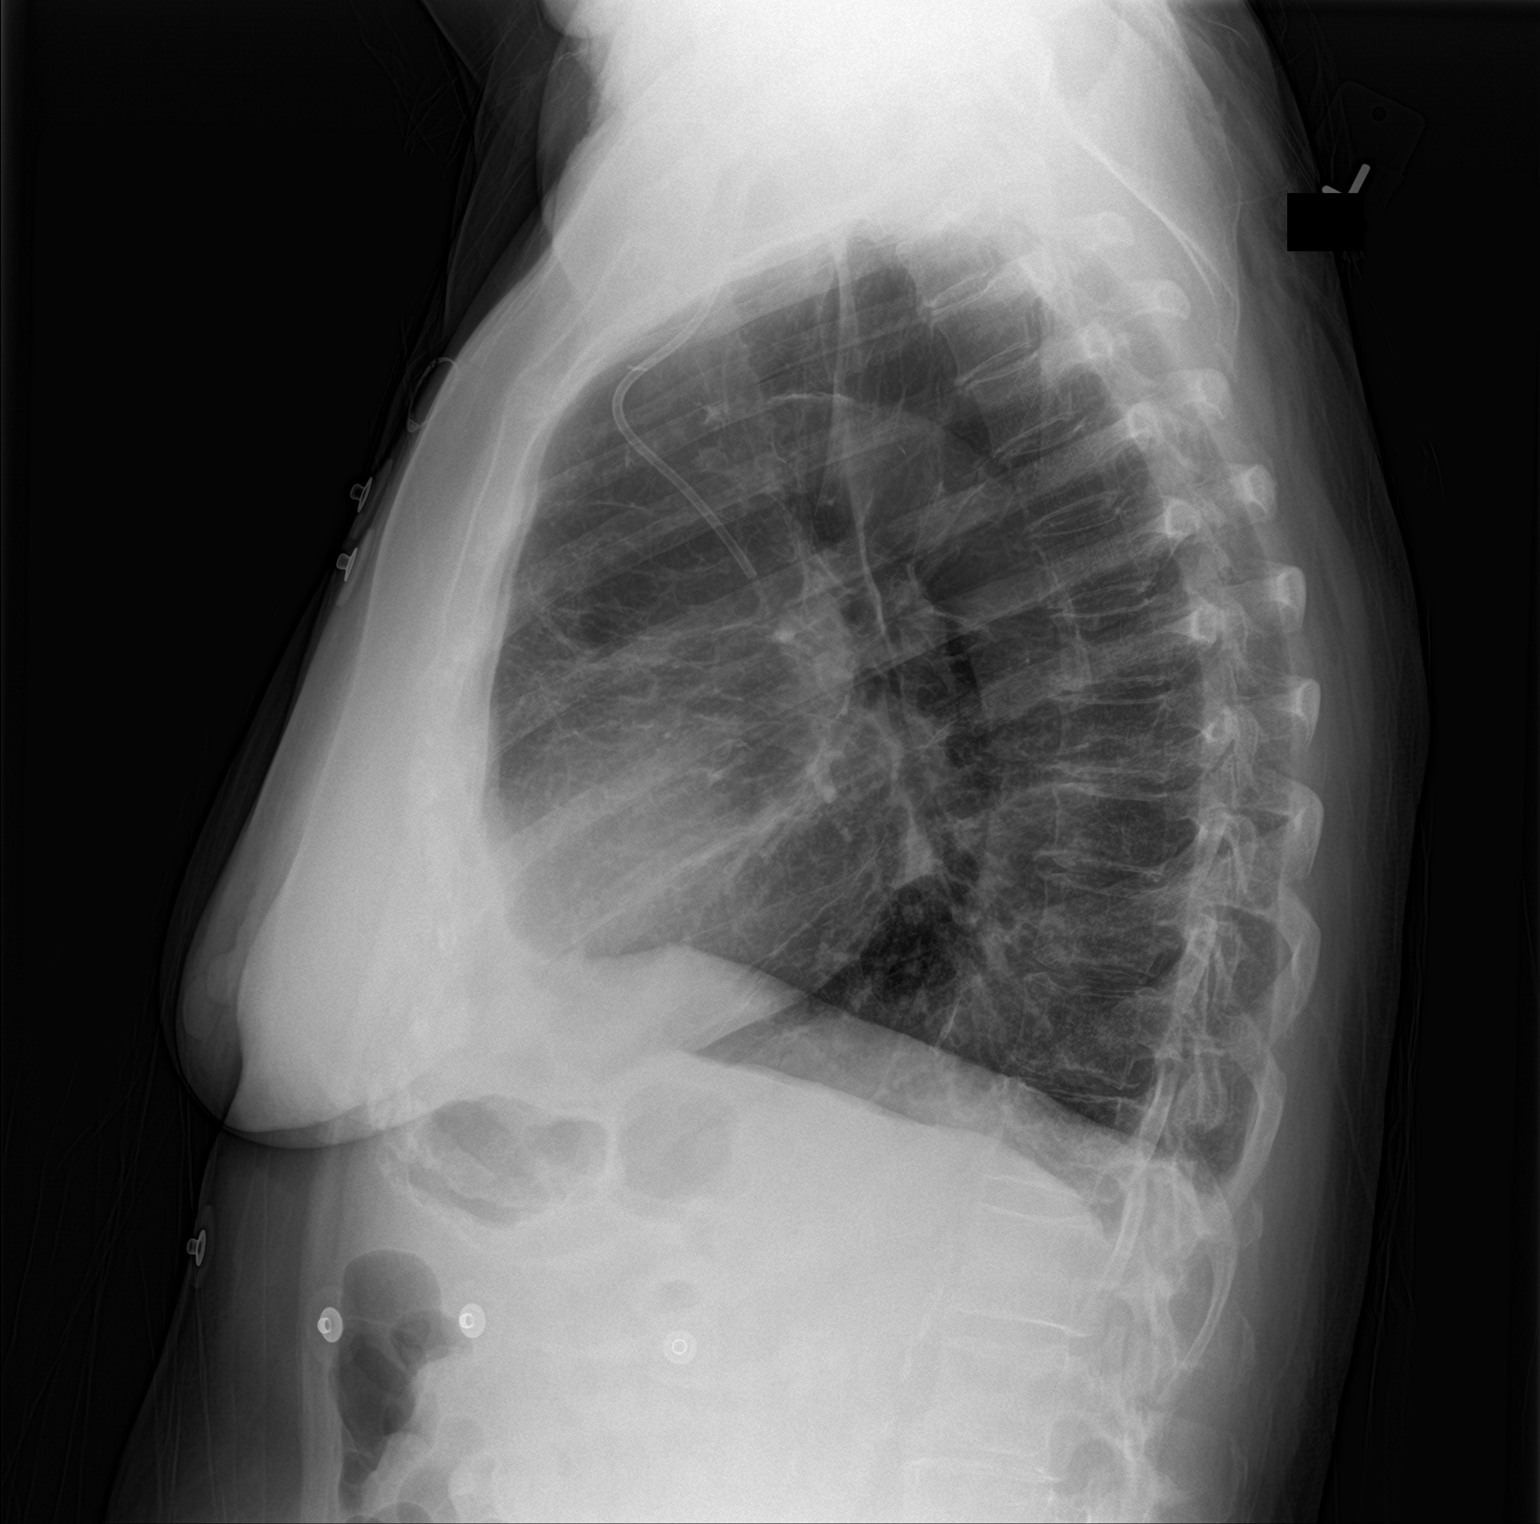

[2 of 2 positions shown; findings below may reference images not displayed]

FINDINGS: Cardiac shadow is stable. Left chest wall port is again noted and
stable. Emphysematous changes are seen with paucity of lung markings
in the apices. No focal infiltrate or sizable effusion is seen. No
bony abnormality is noted. Stable aortic calcifications are seen.
IMPRESSION: COPD without acute abnormality.

## 2018-04-09 IMAGING — CT CT ANGIO CHEST-ABD-PELV FOR DISSECTION W/ AND WO/W CM
2 of 7 series · 12 of 46 positions shown, 14 images · IV contrast (iopamidol)
Comparison: Chest x-ray from same day. CT abdomen pelvis dated
[DATE]. CTA chest dated [DATE].

CLINICAL DATA: Severe midsternal chest pain radiating to the back.

EXAM:
CT ANGIOGRAPHY CHEST, ABDOMEN AND PELVIS
TECHNIQUE: Multidetector CT imaging through the chest, abdomen and pelvis was
performed using the standard protocol during bolus administration of
intravenous contrast. Multiplanar reconstructed images and MIPs were
obtained and reviewed to evaluate the vascular anatomy.
CONTRAST:  80mL [54] IOPAMIDOL ([54]) INJECTION 76%

[Series 5: axial arterial · axial · arterial · 0.69mm/px · z∈[-1063,-508]mm · 9 of 215 slices shown, 11 images]
[im 15/215  soft-tissue]
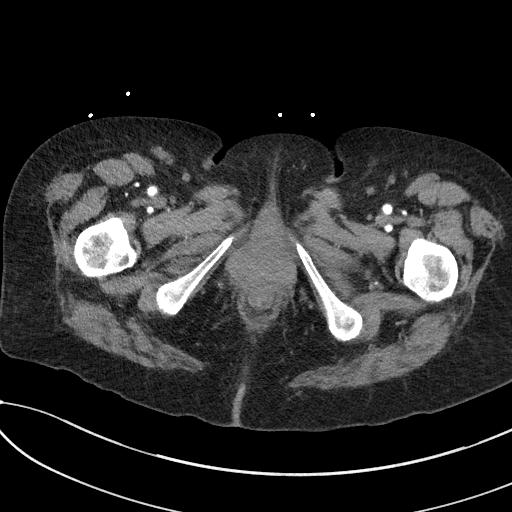
[im 15/215  bone]
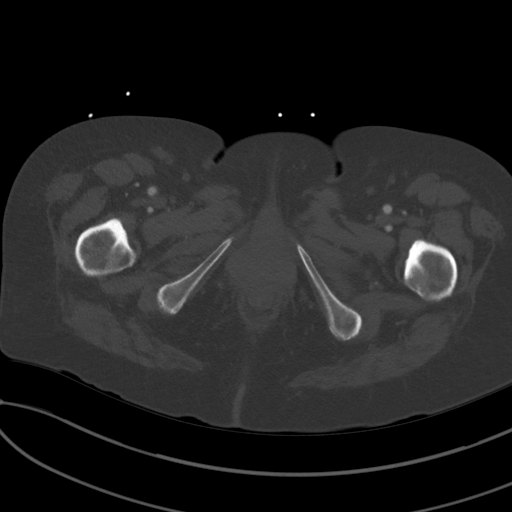
[im 43/215  soft-tissue]
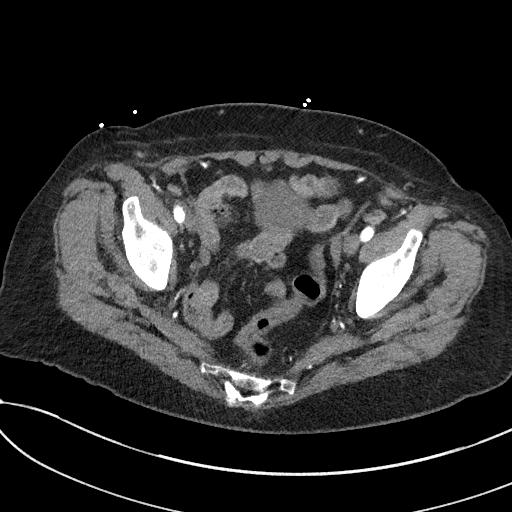
[im 58/215  soft-tissue]
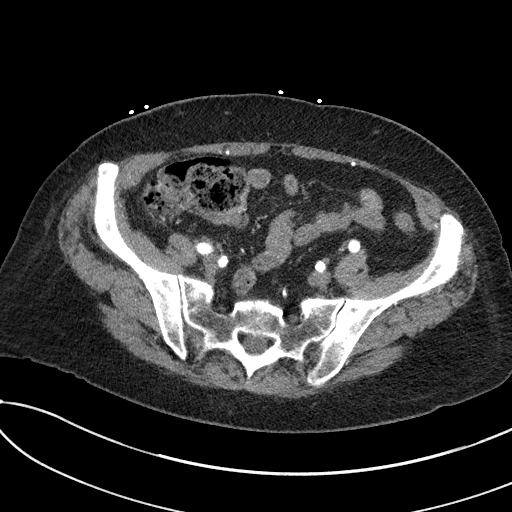
[im 86/215  soft-tissue]
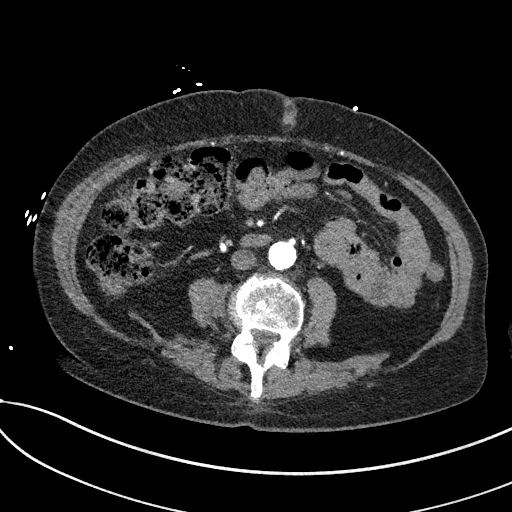
[im 115/215  soft-tissue]
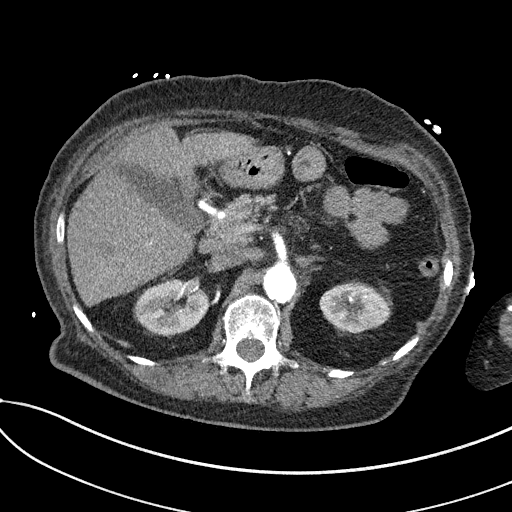
[im 129/215  soft-tissue]
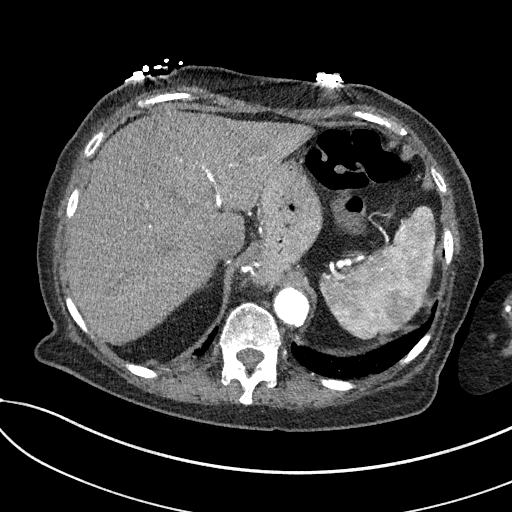
[im 157/215  soft-tissue]
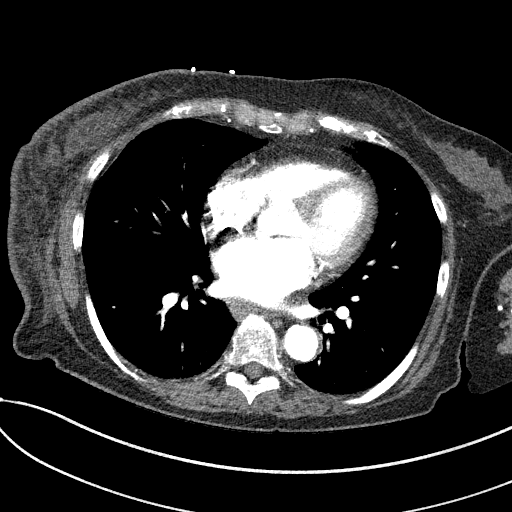
[im 172/215  soft-tissue]
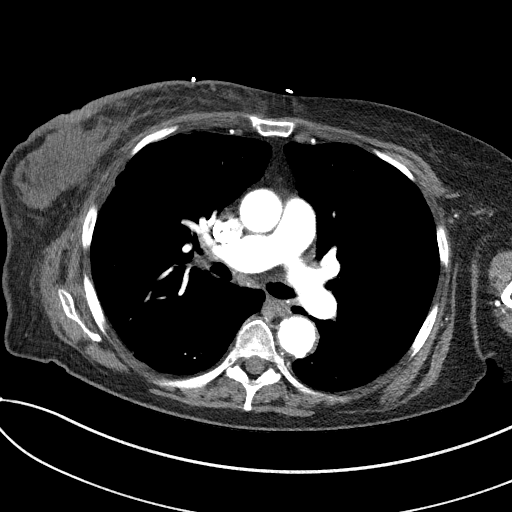
[im 200/215  soft-tissue]
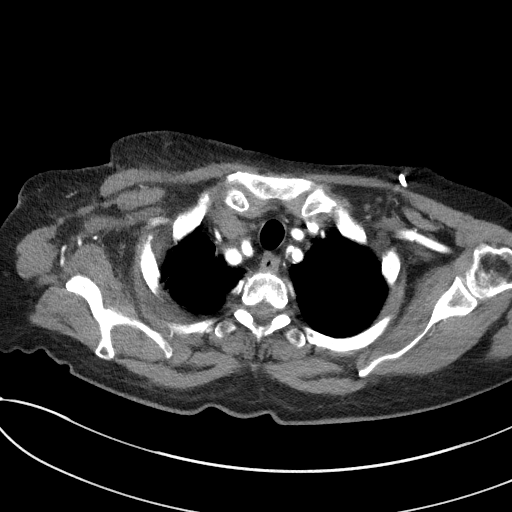
[im 200/215  bone]
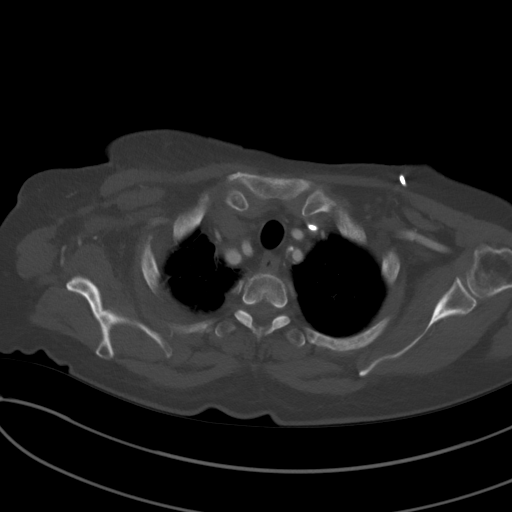

[Series 7: coronals · coronal · 0.72mm/px · 3 of 116 slices shown]
[im 29/116  soft-tissue]
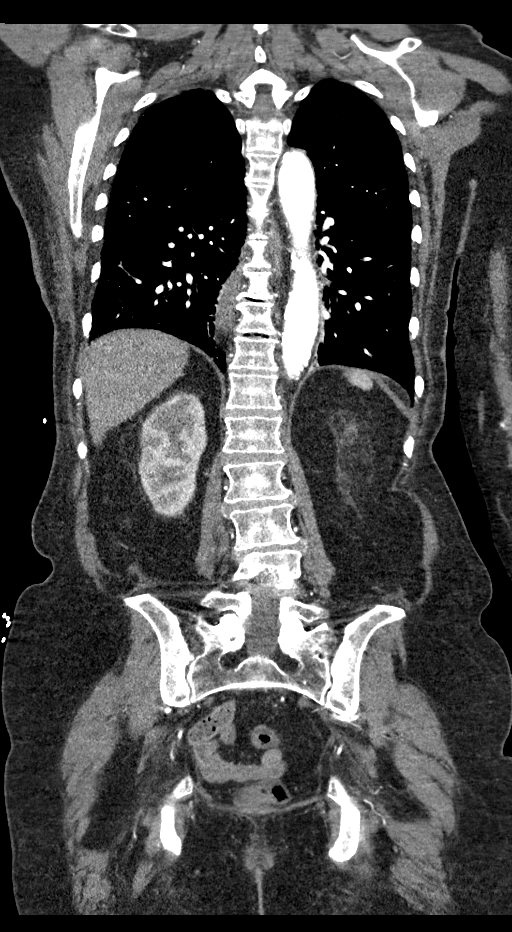
[im 58/116  soft-tissue]
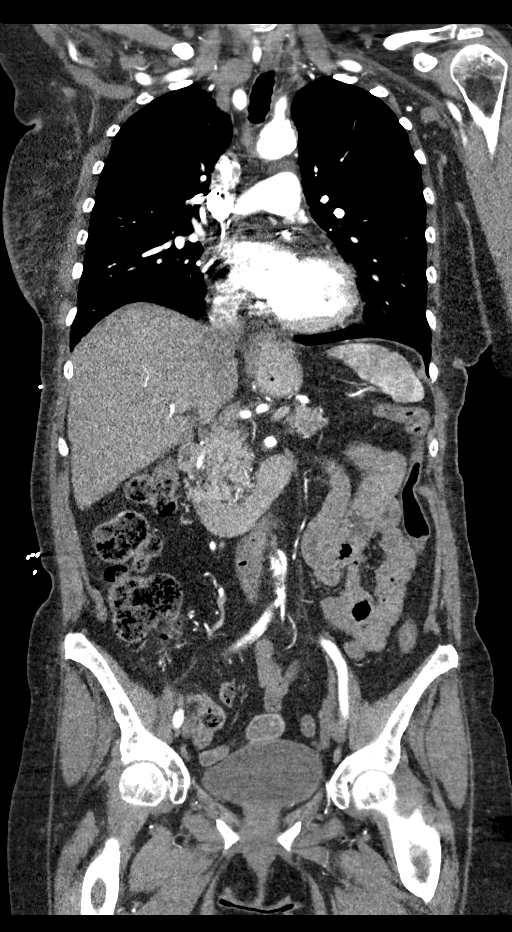
[im 87/116  soft-tissue]
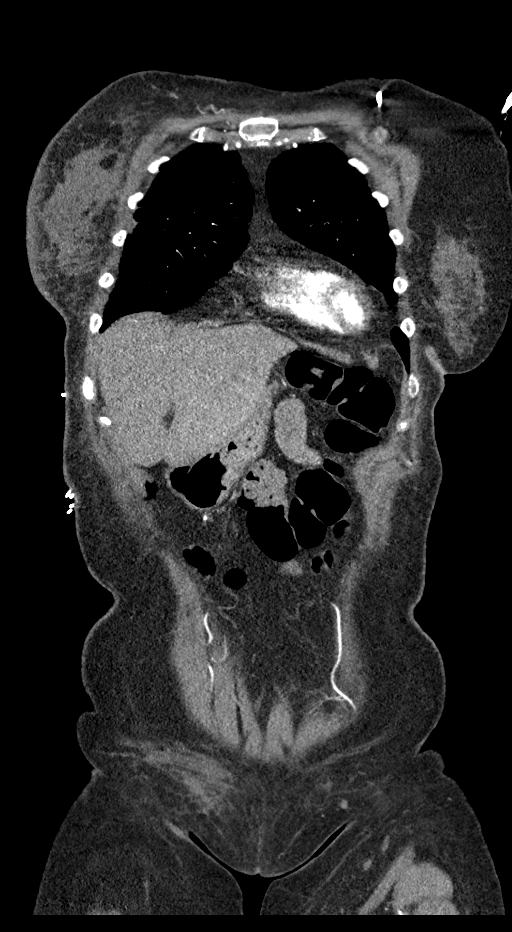

[12 of 46 positions shown; findings below may reference images not displayed]

FINDINGS: CTA CHEST FINDINGS

Cardiovascular: Preferential opacification of the thoracic aorta. No
evidence of thoracic aortic aneurysm or dissection. Coronary, aortic
arch, and branch vessel atherosclerotic vascular disease. Normal
heart size. No pericardial effusion. No pulmonary embolism.

Mediastinum/Nodes: No enlarged mediastinal, hilar, or axillary lymph
nodes. Thyroid gland, trachea, and esophagus demonstrate no
significant findings. Unchanged moderate hiatal hernia.

Lungs/Pleura: Severe bullous emphysema again noted. No focal
consolidation, pleural effusion, or pneumothorax. New 7 x 5 mm
irregular nodule in the superior segment of the left lower lobe
(series 6, image 76).

Musculoskeletal: Prominent skin thickening and subcutaneous edema of
the right breast likely reflects post radiation change. No acute or
significant osseous findings.

Review of the MIP images confirms the above findings.

CTA ABDOMEN AND PELVIS FINDINGS

VASCULAR

Aorta: Normal caliber aorta without aneurysm, dissection, vasculitis
or significant stenosis. Atherosclerosis.

Celiac: Patent without evidence of aneurysm, dissection, vasculitis
or significant stenosis.

SMA: Patent without evidence of aneurysm, dissection, vasculitis or
significant stenosis.

Renals: Both renal arteries are patent without evidence of aneurysm,
dissection, vasculitis, fibromuscular dysplasia or significant
stenosis.

IMA: Patent without evidence of aneurysm, dissection, vasculitis or
significant stenosis.

Inflow: Patent without evidence of aneurysm, dissection, vasculitis
or significant stenosis.

Veins: No obvious venous abnormality within the limitations of this
arterial phase study.

Review of the MIP images confirms the above findings.

NON-VASCULAR

Hepatobiliary: No focal liver abnormality is seen. No gallstones,
gallbladder wall thickening, or biliary dilatation.

Pancreas: Unremarkable. No pancreatic ductal dilatation or
surrounding inflammatory changes.

Spleen: Normal in size without focal abnormality.

Adrenals/Urinary Tract: The adrenal glands are unremarkable.
Unchanged 2.0 cm right renal cyst. Subcentimeter low-density lesion
in the left kidney remains too small to characterize. No renal or
ureteral calculi. No hydronephrosis. Bladder is unremarkable.

Stomach/Bowel: Moderate hiatal hernia. The stomach is otherwise
within normal limits. No bowel wall thickening, distention, or
surrounding inflammatory changes. Normal appendix.

Lymphatic: No enlarged abdominal or pelvic lymph nodes.

Reproductive: Status post hysterectomy. No adnexal masses.

Other: Prior right inguinal hernia repair. Unchanged small fat
containing left inguinal hernia. No free fluid or pneumoperitoneum.

Musculoskeletal: No acute or significant osseous findings. Severe
right asymmetric degenerative disc disease at L4-L5.

Review of the MIP images confirms the above findings.
IMPRESSION: Vascular:

1. No evidence of thoracoabdominal aortic aneurysm, dissection,
penetrating ulcer, or intramural hematoma.
2.  Aortic atherosclerosis ([54]-[54]).

Chest:

1.  No acute intrathoracic process.
2. New 6 mm aggregate pulmonary nodule in the left lower lobe.
Non-contrast chest CT at 6-12 months is recommended. If the nodule
is stable at time of repeat CT, then future CT at 18-24 months (from
today's scan) is considered optional for low-risk patients, but is
recommended for high-risk patients. This recommendation follows the
consensus statement: Guidelines for Management of Incidental
Pulmonary Nodules Detected on CT Images: From the [HOSPITAL]
3.  Emphysema ([54]-[54]).
4. Post radiation changes of the right breast.

Abdomen and pelvis:

1.  No acute intra-abdominal process.
2. Unchanged moderate hiatal hernia.

## 2018-04-09 MED ORDER — HEPARIN BOLUS VIA INFUSION
3800.0000 [IU] | Freq: Once | INTRAVENOUS | Status: AC
  Administered 2018-04-10: 3800 [IU] via INTRAVENOUS
  Filled 2018-04-09: qty 3800

## 2018-04-09 MED ORDER — IOPAMIDOL (ISOVUE-370) INJECTION 76%
80.0000 mL | Freq: Once | INTRAVENOUS | Status: AC | PRN
  Administered 2018-04-09: 80 mL via INTRAVENOUS

## 2018-04-09 MED ORDER — HEPARIN (PORCINE) 25000 UT/250ML-% IV SOLN
850.0000 [IU]/h | INTRAVENOUS | Status: DC
  Administered 2018-04-10: 900 [IU]/h via INTRAVENOUS
  Filled 2018-04-09: qty 250

## 2018-04-09 NOTE — ED Notes (Signed)
Md at bedside

## 2018-04-09 NOTE — H&P (Signed)
[place] [facility] [place] NAME: Krista Cisneros    MR#:  [id number]  DATE OF BIRTH:  31-Oct-1940  DATE OF ADMISSION:  04/09/2018  PRIMARY CARE PHYSICIAN: Sharyne Peach, MD   REQUESTING/REFERRING PHYSICIAN: Kerman Passey, MD  CHIEF COMPLAINT:   Chief Complaint  Patient presents with  . Chest Pain    HISTORY OF PRESENT ILLNESS:  Krista Cisneros  is a 77 y.o. female who presents with chief complaint as above.  Patient presents to the ED after having recurring chest pain for the last 2 to 3 days.  She states that this pain is often self resolving, but got Cisneros worse today and was associated with nausea, lightheadedness, diaphoresis, shortness of breath.  It radiated to both shoulders and through to her back.  It is currently significantly improved, but her troponin was elevated greater than 2 here in the ED initially.  Hospitalist were called for admission  PAST MEDICAL HISTORY:   Past Medical History:  Diagnosis Date  . Anxiety   . Arthritis   . Cancer [facility])    Right Breast Cancer  . COPD (chronic obstructive pulmonary disease) ([place])   . Depression   . Dyspnea    with exertion  . GERD (gastroesophageal reflux disease)   . Hyperlipidemia   . Hypertension   . Personal history of chemotherapy   . Personal history of radiation therapy      PAST SURGICAL HISTORY:   Past Surgical History:  Procedure Laterality Date  . ABDOMINAL HYSTERECTOMY  1990   Partial  . BREAST BIOPSY Right 10/04/2016   axilla lymph node and axillay tail mass biopsy. invasive mammary carcinoma  . BREAST LUMPECTOMY Right 03/21/2017  . BREAST LUMPECTOMY Right 05/21/2017   re-excision  . BREAST LUMPECTOMY WITH NEEDLE LOCALIZATION Right 03/21/2017   Procedure: BREAST LUMPECTOMY WITH NEEDLE LOCALIZATION;  Surgeon: Clayburn Pert, MD;  Location: [facility] ORS;  Service: General;  Laterality: Right;  . DILATION AND CURETTAGE OF UTERUS    . INGUINAL HERNIA REPAIR  Right 03/26/2017   Procedure: HERNIA REPAIR INGUINAL INCARCERATED;  Surgeon: Jules Husbands, MD;  Location: [facility] ORS;  Service: General;  Laterality: Right;  . PORTACATH PLACEMENT Left 10/24/2016   Procedure: INSERTION PORT-A-CATH;  Surgeon: [name], MD;  Location: [facility] ORS;  Service: General;  Laterality: Left;  . RE-EXCISION OF BREAST LUMPECTOMY Right 05/21/2017   Procedure: RE-EXCISION OF BREAST LUMPECTOMY;  Surgeon: Clayburn Pert, MD;  Location: [facility] ORS;  Service: General;  Laterality: Right;  . SENTINEL NODE BIOPSY Right 03/21/2017   Procedure: SENTINEL NODE BIOPSY;  Surgeon: Clayburn Pert, MD;  Location: [facility] ORS;  Service: General;  Laterality: Right;     SOCIAL HISTORY:   Social History   Tobacco Use  . Smoking status: Former Smoker    Packs/day: 0.50    Types: Cigarettes    Last attempt to quit: 07/23/1988    Years since quitting: 29.7  . Smokeless tobacco: Never Used  Substance Use Topics  . Alcohol use: No     FAMILY HISTORY:   Family History  Problem Relation Age of Onset  . Colon cancer Mother   . Breast cancer Neg Hx      DRUG ALLERGIES:   Allergies  Allergen Reactions  . Aspirin     Other reaction(s): Other (See Comments) GI bleeding risk  . Nsaids     Other reaction(s): Other (See Comments) Bleeding risk  . Penicillins Anaphylaxis, Swelling and Rash  Has patient had a PCN reaction causing immediate rash, facial/tongue/throat swelling, SOB or lightheadedness with hypotension: Yes Has patient had a PCN reaction causing severe rash involving mucus membranes or skin necrosis: No Has patient had a PCN reaction that required hospitalization: No  Has patient had a PCN reaction occurring within the last 10 years: No If all of the above answers are "NO", then may proceed with Cephalosporin use.   . Atorvastatin Other (See Comments)    Muscle Pain  . Gabapentin Swelling  . Ezetimibe Other (See Comments)    Muscle pain    MEDICATIONS AT HOME:    Prior to Admission medications   Medication Sig Start Date End Date Taking? Authorizing Provider  ADVAIR DISKUS 500-50 MCG/DOSE AEPB Inhale 1 puff into the lungs 2 (two) times daily.  05/04/16   [provider]  aspirin EC 81 MG tablet Take 81 mg by mouth daily.    [provider]  Aspirin-Caffeine (BAYER BACK & BODY PAIN EX ST PO) Take 2 tablets by mouth 2 (two) times daily as needed (for back pain).     [provider]  baclofen (LIORESAL) 10 MG tablet Take 10 mg by mouth at bedtime. 01/14/17   [provider]  benzonatate (TESSALON) 200 MG capsule Take 200 mg by mouth 3 (three) times daily as needed. For cough 10/05/17   [provider]  co-enzyme Q-10 30 MG capsule Take 30 mg by mouth daily.    [provider]  diphenoxylate-atropine (LOMOTIL) 2.5-0.025 MG tablet Take 1 tablet by mouth 4 (four) times daily as needed for diarrhea or loose stools. 02/06/17   Jacquelin Hawking, NP  DULoxetine (CYMBALTA) 30 MG capsule Take 30 mg by mouth every morning. Take with 60 mg to total 90 mg once daily 05/04/17   [provider]  DULoxetine (CYMBALTA) 60 MG capsule Take 60 mg by mouth every morning. Take with 30 mg to total 90 mg once daily 03/04/17   [provider]  KLOR-CON M20 20 MEQ tablet TAKE 1 TABLET BY MOUTH EVERY DAY 11/30/17   Lloyd Huger, MD  letrozole [facility]) 2.5 MG tablet Take 1 tablet (2.5 mg total) by mouth daily. 06/19/17   Verlon Au, NP  lidocaine-prilocaine (EMLA) cream Apply 1 application as needed topically (for port access).    [provider]  losartan-hydrochlorothiazide (HYZAAR) 100-25 MG tablet Take 1 tablet by mouth daily.  05/12/16   [provider]  magnesium 30 MG tablet Take 30 mg by mouth 2 (two) times daily.    [provider]  Melatonin 5 MG TABS Take 10 mg by mouth at bedtime as needed (sleep).    [provider]  montelukast (SINGULAIR) 10 MG tablet Take  10 mg by mouth at bedtime.  05/23/16   [provider]  Omega-3 Fatty Acids (FISH OIL) 1000 MG CAPS Take 1,000 mg by mouth daily.    [provider]  omeprazole (PRILOSEC) 20 MG capsule Take 20 mg by mouth every morning.     [provider]  oxyCODONE (ROXICODONE) 5 MG immediate release tablet Take 1 tablet (5 mg total) by mouth every 4 (four) hours as needed for moderate pain or severe pain. 05/21/17   Clayburn Pert, MD  potassium chloride SA (K-DUR,KLOR-CON) 20 MEQ tablet Take 2 tablets (40 mEq total) by mouth daily. This is in addition to ur regular dose of 95meq daily 02/10/17 03/26/18  Dustin Flock, MD  promethazine (PHENERGAN) 25 MG tablet  TAKE 25 MG BY MOUTH EVERY 6-8 HOURS AS NEEDED FOR NAUSEA 11/04/16   [provider]  triamcinolone ointment (KENALOG) 0.5 % Apply 1 application topically 2 (two) times daily. Rash on bilateral upper extremities 11/29/16   Jacquelin Hawking, NP  prochlorperazine (COMPAZINE) 10 MG tablet Take 1 tablet (10 mg total) by mouth every 6 (six) hours as needed (Nausea or vomiting). 10/20/16 07/31/17  Lloyd Huger, MD    REVIEW OF SYSTEMS:  Review of Systems  Constitutional: Positive for diaphoresis. Negative for chills, fever, malaise/fatigue and weight loss.  HENT: Negative for ear pain, hearing loss and tinnitus.   Eyes: Negative for blurred vision, double vision, pain and redness.  Respiratory: Positive for shortness of breath. Negative for cough and hemoptysis.   Cardiovascular: Positive for chest pain. Negative for palpitations, orthopnea and leg swelling.  Gastrointestinal: Negative for abdominal pain, constipation, diarrhea, nausea and vomiting.  Genitourinary: Negative for dysuria, frequency and hematuria.  Musculoskeletal: Negative for back pain, joint pain and neck pain.  Skin:       No acne, rash, or lesions  Neurological: Negative for dizziness, tremors, focal weakness and weakness.  Endo/Heme/Allergies:  Negative for polydipsia. Does not bruise/bleed easily.  Psychiatric/Behavioral: Negative for depression. The patient is not nervous/anxious and does not have insomnia.      VITAL SIGNS:   Vitals:   04/09/18 1937 04/09/18 2100  BP: (!) 152/75 137/67  Pulse: 79 75  Resp: 18 16  Temp: 98.2 F (36.8 C)   TempSrc: Oral   SpO2: 99% 97%  Weight: 67.1 kg   Height: 5\' 2"  (1.575 m)    Wt Readings from Last 3 Encounters:  04/09/18 67.1 kg  04/08/18 67.1 kg  02/19/18 66.8 kg    PHYSICAL EXAMINATION:  Physical Exam  Vitals reviewed. Constitutional: She is oriented to person, place, and time. She appears well-developed and well-nourished. No distress.  HENT:  Head: Normocephalic and atraumatic.  Mouth/Throat: Oropharynx is clear and moist.  Eyes: Pupils are equal, round, and reactive to light. Conjunctivae and EOM are normal. No scleral icterus.  Neck: Normal range of motion. Neck supple. No JVD present. No thyromegaly present.  Cardiovascular: Normal rate, regular rhythm and intact distal pulses. Exam reveals no gallop and no friction rub.  No murmur heard. Respiratory: Effort normal and breath sounds normal. No respiratory distress. She has no wheezes. She has no rales.  GI: Soft. Bowel sounds are normal. She exhibits no distension. There is no abdominal tenderness.  Musculoskeletal: Normal range of motion.        General: No edema.     Comments: No arthritis, no gout  Lymphadenopathy:    She has no cervical adenopathy.  Neurological: She is alert and oriented to person, place, and time. No cranial nerve deficit.  No dysarthria, no aphasia  Skin: Skin is warm and dry. No rash noted. No erythema.  Psychiatric: She has a normal mood and affect. Her behavior is normal. Judgment and thought content normal.    LABORATORY PANEL:   CBC Recent Labs  Lab 04/09/18 2050  WBC 10.8*  HGB 10.4*  HCT 34.3*  PLT 291    ------------------------------------------------------------------------------------------------------------------  Chemistries  Recent Labs  Lab 04/09/18 2050  NA 140  K 3.4*  CL 109  CO2 23  GLUCOSE 127*  BUN 20  CREATININE 1.20*  CALCIUM 8.6*   ------------------------------------------------------------------------------------------------------------------  Cardiac Enzymes Recent Labs  Lab 04/09/18 2050  TROPONINI 2.71*   ------------------------------------------------------------------------------------------------------------------  RADIOLOGY:  Dg Chest  2 View  Result Date: 04/09/2018 CLINICAL DATA:  Midsternal chest pain for 2 days EXAM: CHEST - 2 VIEW COMPARISON:  05/01/2008 FINDINGS: Cardiac shadow is stable. Left chest wall port is again noted and stable. Emphysematous changes are seen with paucity of lung markings in the apices. No focal infiltrate or sizable effusion is seen. No bony abnormality is noted. Stable aortic calcifications are seen. IMPRESSION: COPD without acute abnormality. Electronically Signed   By: Inez Catalina M.D.   On: 04/09/2018 20:06   Ct Angio Chest/abd/pel For Dissection W And/or Wo Contrast  Result Date: 04/09/2018 CLINICAL DATA:  Severe midsternal chest pain radiating to the back. EXAM: CT ANGIOGRAPHY CHEST, ABDOMEN AND PELVIS TECHNIQUE: Multidetector CT imaging through the chest, abdomen and pelvis was performed using the standard protocol during bolus administration of intravenous contrast. Multiplanar reconstructed images and MIPs were obtained and reviewed to evaluate the vascular anatomy. CONTRAST:  60mL ISOVUE-370 IOPAMIDOL (ISOVUE-370) INJECTION 76% COMPARISON:  Chest x-ray from same day. CT abdomen pelvis dated March 26, 2017. CTA chest dated January 22, 2017. FINDINGS: CTA CHEST FINDINGS Cardiovascular: Preferential opacification of the thoracic aorta. No evidence of thoracic aortic aneurysm or dissection. Coronary, aortic arch,  and branch vessel atherosclerotic vascular disease. Normal heart size. No pericardial effusion. No pulmonary embolism. Mediastinum/Nodes: No enlarged mediastinal, hilar, or axillary lymph nodes. Thyroid gland, trachea, and esophagus demonstrate no significant findings. Unchanged moderate hiatal hernia. Lungs/Pleura: Severe bullous emphysema again noted. No focal consolidation, pleural effusion, or pneumothorax. New 7 x 5 mm irregular nodule in the superior segment of the left lower lobe (series 6, image 76). Musculoskeletal: Prominent skin thickening and subcutaneous edema of the right breast likely reflects post radiation change. No acute or significant osseous findings. Review of the MIP images confirms the above findings. CTA ABDOMEN AND PELVIS FINDINGS VASCULAR Aorta: Normal caliber aorta without aneurysm, dissection, vasculitis or significant stenosis. Atherosclerosis. Celiac: Patent without evidence of aneurysm, dissection, vasculitis or significant stenosis. SMA: Patent without evidence of aneurysm, dissection, vasculitis or significant stenosis. Renals: Both renal arteries are patent without evidence of aneurysm, dissection, vasculitis, fibromuscular dysplasia or significant stenosis. IMA: Patent without evidence of aneurysm, dissection, vasculitis or significant stenosis. Inflow: Patent without evidence of aneurysm, dissection, vasculitis or significant stenosis. Veins: No obvious venous abnormality within the limitations of this arterial phase study. Review of the MIP images confirms the above findings. NON-VASCULAR Hepatobiliary: No focal liver abnormality is seen. No gallstones, gallbladder wall thickening, or biliary dilatation. Pancreas: Unremarkable. No pancreatic ductal dilatation or surrounding inflammatory changes. Spleen: Normal in size without focal abnormality. Adrenals/Urinary Tract: The adrenal glands are unremarkable. Unchanged 2.0 cm right renal cyst. Subcentimeter low-density lesion in the  left kidney remains too small to characterize. No renal or ureteral calculi. No hydronephrosis. Bladder is unremarkable. Stomach/Bowel: Moderate hiatal hernia. The stomach is otherwise within normal limits. No bowel wall thickening, distention, or surrounding inflammatory changes. Normal appendix. Lymphatic: No enlarged abdominal or pelvic lymph nodes. Reproductive: Status post hysterectomy. No adnexal masses. Other: Prior right inguinal hernia repair. Unchanged small fat containing left inguinal hernia. No free fluid or pneumoperitoneum. Musculoskeletal: No acute or significant osseous findings. 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 Review of the MIP images confirms the above findings. IMPRESSION: Vascular: 1. No evidence of thoracoabdominal aortic aneurysm, dissection, penetrating ulcer, or intramural hematoma. 2.  Aortic atherosclerosis (ICD10-I70.0). Chest: 1.  No acute intrathoracic process. 2. New 6 mm aggregate pulmonary nodule in the left lower lobe. Non-contrast chest CT at 6-12  months is recommended. If the nodule is stable at time of repeat CT, then future CT at 18-24 months (from today's scan) is considered optional for low-risk patients, but is recommended for high-risk patients. This recommendation follows the consensus statement: Guidelines for Management of Incidental Pulmonary Nodules Detected on CT Images: From the Fleischner Society 2017; Radiology 2017; 284:228-243. 3.  Emphysema (ICD10-J43.9). 4. Post radiation changes of the right breast. Abdomen and pelvis: 1.  No acute intra-abdominal process. 2. Unchanged moderate hiatal hernia. Electronically Signed   By: Titus Dubin M.D.   On: 04/09/2018 23:03    EKG:   Orders placed or performed during the hospital encounter of 04/09/18  . ED EKG within 10 minutes  . 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 Labs, imaging and/or ECG reviewed by provider and discussed with patient/family. Management plans discussed with the patient and/or family.  DVT PROPHYLAXIS: Systemic anticoagulation  GI PROPHYLAXIS:  PPI   ADMISSION STATUS: Inpatient     CODE STATUS: Full Code Status History    Date Active Date Inactive Code Status Order ID Comments User Context   03/27/2017 0121 03/28/2017 1812 Full Code [id number]  Jules Husbands, MD Inpatient   02/07/2017 1703 02/10/2017 1613 Full Code [id number]  Bettey Costa, MD Inpatient      TOTAL TIME TAKING CARE OF THIS PATIENT: 45 minutes.   Jannifer [name], Kyrie Bun [place]ton 04/09/2018, 11:45 PM  [employer]  [phone]  CC: Primary care physician; Sharyne Peach, MD  Note:  This document was prepared using Dragon voice recognition software and may include unintentional dictation errors.

## 2018-04-09 NOTE — ED Notes (Signed)
Patient requested blood to be drawn upon getting to exam room (for using her port). Declined having blood work done peripherally at this time.

## 2018-04-09 NOTE — ED Notes (Signed)
Off unit to ct r/o AAA, will hepramnized upon arrival.

## 2018-04-09 NOTE — ED Triage Notes (Signed)
Patient to ER for c/o midsternal chest pain x2 days, more severe today. Patient reports radiation to back and tingling to both arms. Patient had EMS come to home, but came to ER via POV. Patient's vitals with EMS: 155/73, HR 70, 97% on RA. Patient took 324mg  ASA at home. Denies any history of chest pain.  Patient has implanted port for breast CA, not currently doing chemo (finished in August).

## 2018-04-09 NOTE — Progress Notes (Signed)
ANTICOAGULATION CONSULT NOTE - Initial Consult  Pharmacy Consult for heparin drip Indication: chest pain/ACS  Allergies  Allergen Reactions  . Aspirin     Other reaction(s): Other (See Comments) GI bleeding risk  . Nsaids     Other reaction(s): Other (See Comments) Bleeding risk  . Penicillins Anaphylaxis, Swelling and Rash    Has patient had a PCN reaction causing immediate rash, facial/tongue/throat swelling, SOB or lightheadedness with hypotension: Yes Has patient had a PCN reaction causing severe rash involving mucus membranes or skin necrosis: No Has patient had a PCN reaction that required hospitalization: No  Has patient had a PCN reaction occurring within the last 10 years: No If all of the above answers are "NO", then may proceed with Cephalosporin use.   . Atorvastatin Other (See Comments)    Muscle Pain  . Gabapentin Swelling  . Ezetimibe Other (See Comments)    Muscle pain    Patient Measurements: Height: 5\' 2"  (157.5 cm) Weight: 147 lb 14.9 oz (67.1 kg) IBW/kg (Calculated) : 50.1 Heparin Dosing Weight: 64 kg  Vital Signs: Temp: 98.2 F (36.8 C) (12/17 1937) Temp Source: Oral (12/17 1937) BP: 137/67 (12/17 2100) Pulse Rate: 75 (12/17 2100)  Labs: Recent Labs    04/09/18 2050  HGB 10.4*  HCT 34.3*  PLT 291  CREATININE 1.20*  TROPONINI 2.71*    Estimated Creatinine Clearance: 35.8 mL/min (A) (by C-G formula based on SCr of 1.2 mg/dL (H)).   Medical History: Past Medical History:  Diagnosis Date  . Anxiety   . Arthritis   . Cancer [facility])    Right Breast Cancer  . COPD (chronic obstructive pulmonary disease) ([place])   . Depression   . Dyspnea    with exertion  . GERD (gastroesophageal reflux disease)   . Hyperlipidemia   . Hypertension   . Personal history of chemotherapy   . Personal history of radiation therapy     Medications:  Scheduled:  . [START ON 04/10/2018] heparin  3,800 Units Intravenous Once    Assessment: Patient arrived  w/ c/o midsternal CP x 2 days more severe on arrival w/ initial trops of 2.71 and EKG w/ ST/T wave abnormality, no apparent cardiac hx. No PTA anticoagulation Patient is being started on heparin drip  Goal of Therapy:  Heparin level 0.3-0.7 units/ml Monitor platelets by anticoagulation protocol: Yes   Plan:  Will bolus w/ heparin 3800 units IV x 1 Will start rate at 900 units/hr  Will check anti-Xa @ 0800 Baseline labs drawn Will monitor CBC's and adjust per anti-Xa levels  Tobie Lords, PharmD, BCPS Clinical Pharmacist 04/10/2018

## 2018-04-09 NOTE — ED Notes (Signed)
Left ACW port accessed, labs drawn and sent.

## 2018-04-09 NOTE — ED Notes (Addendum)
Critical troponin called in at 2.71 Md aware

## 2018-04-09 NOTE — ED Provider Notes (Signed)
[facility] Emergency Department Provider Note  Time seen: 8:56 PM  I have reviewed the triage vital signs and the nursing notes.   HISTORY  Chief Complaint Chest Pain    HPI Krista Cisneros is a 77 y.o. female with a past medical history of COPD, anxiety, gastric reflux, hypertension, hyperlipidemia, presents to the emergency department for chest pain.  According to the patient since yesterday she has been experiencing intermittent chest pain.  States at times it is severe 8/10 in severity starting in the front and radiating to both shoulders and at times to her back.  Describes as more of a sharp pain currently describes the pain as a 3 or 4/10.  States when the pain was severe she was somewhat short of breath and clammy.  Denies any cardiac history.   Past Medical History:  Diagnosis Date  . Anxiety   . Arthritis   . Cancer [facility])    Right Breast Cancer  . COPD (chronic obstructive pulmonary disease) ([place])   . Depression   . Dyspnea    with exertion  . GERD (gastroesophageal reflux disease)   . Hyperlipidemia   . Hypertension   . Personal history of chemotherapy   . Personal history of radiation therapy     Patient Active Problem List   Diagnosis Date Noted  . Malignant neoplasm of upper-outer quadrant of right breast in female, estrogen receptor negative ([place]) 06/19/2017  . Incarcerated hernia 03/26/2017  . Incarcerated inguinal hernia   . Acute kidney injury ([place]) 02/07/2017  . Hypomagnesemia 01/30/2017  . Goals of care, counseling/discussion 10/20/2016  . Malignant neoplasm of right female breast ([place]) 10/16/2016  . Hyperlipidemia, mixed 10/08/2016  . Chronic venous insufficiency 09/14/2016  . GERD (gastroesophageal reflux disease) 09/14/2016  . Pain in limb 05/29/2016  . Essential hypertension 05/29/2016  . COPD (chronic obstructive pulmonary disease) ([place]) 05/29/2016  . Hardening of the aorta (main artery of the heart) ([place])  01/07/2016  . Hyperglycemia, unspecified 01/07/2016  . Neuropathy 11/15/2015  . Severe recurrent major depression without psychotic features ([place]) 09/10/2015  . Allergic rhinitis 04/10/2014  . Depression 04/10/2014  . Obesity, unspecified 04/10/2014    Past Surgical History:  Procedure Laterality Date  . ABDOMINAL HYSTERECTOMY  1990   Partial  . BREAST BIOPSY Right 10/04/2016   axilla lymph node and axillay tail mass biopsy. invasive mammary carcinoma  . BREAST LUMPECTOMY Right 03/21/2017  . BREAST LUMPECTOMY Right 05/21/2017   re-excision  . BREAST LUMPECTOMY WITH NEEDLE LOCALIZATION Right 03/21/2017   Procedure: BREAST LUMPECTOMY WITH NEEDLE LOCALIZATION;  Surgeon: Clayburn Pert, MD;  Location: [facility] ORS;  Service: General;  Laterality: Right;  . DILATION AND CURETTAGE OF UTERUS    . INGUINAL HERNIA REPAIR Right 03/26/2017   Procedure: HERNIA REPAIR INGUINAL INCARCERATED;  Surgeon: Jules Husbands, MD;  Location: [facility] ORS;  Service: General;  Laterality: Right;  . PORTACATH PLACEMENT Left 10/24/2016   Procedure: INSERTION PORT-A-CATH;  Surgeon: [name], MD;  Location: [facility] ORS;  Service: General;  Laterality: Left;  . RE-EXCISION OF BREAST LUMPECTOMY Right 05/21/2017   Procedure: RE-EXCISION OF BREAST LUMPECTOMY;  Surgeon: Clayburn Pert, MD;  Location: [facility] ORS;  Service: General;  Laterality: Right;  . SENTINEL NODE BIOPSY Right 03/21/2017   Procedure: SENTINEL NODE BIOPSY;  Surgeon: Clayburn Pert, MD;  Location: [facility] ORS;  Service: General;  Laterality: Right;    Prior to Admission medications   Medication Sig Start Date End Date Taking? Authorizing Provider  ADVAIR  DISKUS 500-50 MCG/DOSE AEPB Inhale 1 puff into the lungs 2 (two) times daily.  05/04/16   [provider]  aspirin EC 81 MG tablet Take 81 mg by mouth daily.    [provider]  Aspirin-Caffeine (BAYER BACK & BODY PAIN EX ST PO) Take 2 tablets by mouth 2 (two) times daily as needed (for  back pain).     [provider]  baclofen (LIORESAL) 10 MG tablet Take 10 mg by mouth at bedtime. 01/14/17   [provider]  benzonatate (TESSALON) 200 MG capsule Take 200 mg by mouth 3 (three) times daily as needed. For cough 10/05/17   [provider]  co-enzyme Q-10 30 MG capsule Take 30 mg by mouth daily.    [provider]  diphenoxylate-atropine (LOMOTIL) 2.5-0.025 MG tablet Take 1 tablet by mouth 4 (four) times daily as needed for diarrhea or loose stools. 02/06/17   Jacquelin Hawking, NP  DULoxetine (CYMBALTA) 30 MG capsule Take 30 mg by mouth every morning. Take with 60 mg to total 90 mg once daily 05/04/17   [provider]  DULoxetine (CYMBALTA) 60 MG capsule Take 60 mg by mouth every morning. Take with 30 mg to total 90 mg once daily 03/04/17   [provider]  KLOR-CON M20 20 MEQ tablet TAKE 1 TABLET BY MOUTH EVERY DAY 11/30/17   Lloyd Huger, MD  letrozole [facility]) 2.5 MG tablet Take 1 tablet (2.5 mg total) by mouth daily. 06/19/17   Verlon Au, NP  lidocaine-prilocaine (EMLA) cream Apply 1 application as needed topically (for port access).    [provider]  losartan-hydrochlorothiazide (HYZAAR) 100-25 MG tablet Take 1 tablet by mouth daily.  05/12/16   [provider]  magnesium 30 MG tablet Take 30 mg by mouth 2 (two) times daily.    [provider]  Melatonin 5 MG TABS Take 10 mg by mouth at bedtime as needed (sleep).    [provider]  montelukast (SINGULAIR) 10 MG tablet Take 10 mg by mouth at bedtime.  05/23/16   [provider]  Omega-3 Fatty Acids (FISH OIL) 1000 MG CAPS Take 1,000 mg by mouth daily.    [provider]  omeprazole (PRILOSEC) 20 MG capsule Take 20 mg by mouth every morning.     [provider]  oxyCODONE (ROXICODONE) 5 MG immediate release tablet Take 1 tablet (5 mg total) by mouth every 4 (four) hours as needed for moderate pain or severe  pain. 05/21/17   Clayburn Pert, MD  potassium chloride SA (K-DUR,KLOR-CON) 20 MEQ tablet Take 2 tablets (40 mEq total) by mouth daily. This is in addition to ur regular dose of 54meq daily 02/10/17 03/26/18  Dustin Flock, MD  promethazine (PHENERGAN) 25 MG tablet TAKE 25 MG BY MOUTH EVERY 6-8 HOURS AS NEEDED FOR NAUSEA 11/04/16   [provider]  triamcinolone ointment (KENALOG) 0.5 % Apply 1 application topically 2 (two) times daily. Rash on bilateral upper extremities 11/29/16   Jacquelin Hawking, NP  prochlorperazine (COMPAZINE) 10 MG tablet Take 1 tablet (10 mg total) by mouth every 6 (six) hours as needed (Nausea or vomiting). 10/20/16 07/31/17  Lloyd Huger, MD    Allergies  Allergen Reactions  . Aspirin     Other reaction(s): Other (See Comments) GI bleeding risk  . Nsaids     Other reaction(s): Other (See Comments) Bleeding risk  . Penicillins Anaphylaxis, Swelling and Rash    Has patient had  a PCN reaction causing immediate rash, facial/tongue/throat swelling, SOB or lightheadedness with hypotension: Yes Has patient had a PCN reaction causing severe rash involving mucus membranes or skin necrosis: No Has patient had a PCN reaction that required hospitalization: No  Has patient had a PCN reaction occurring within the last 10 years: No If all of the above answers are "NO", then may proceed with Cephalosporin use.   . Atorvastatin Other (See Comments)    Muscle Pain  . Gabapentin Swelling  . Ezetimibe Other (See Comments)    Muscle pain    Family History  Problem Relation Age of Onset  . Colon cancer Mother   . Breast cancer Neg Hx     Social History Social History   Tobacco Use  . Smoking status: Former Smoker    Packs/day: 0.50    Types: Cigarettes    Last attempt to quit: 07/23/1988    Years since quitting: 29.7  . Smokeless tobacco: Never Used  Substance Use Topics  . Alcohol use: No  . Drug use: No    Review of Systems Constitutional: Negative  for fever. Cardiovascular: Intermittent chest pain, severe earlier mild currently. Respiratory: Mild shortness of breath. Gastrointestinal: Negative for abdominal pain, vomiting and diarrhea. Genitourinary: Negative for urinary compaints Musculoskeletal: Negative for leg pain or swelling Skin: Negative for skin complaints  Neurological: Negative for headache All other ROS negative  ____________________________________________   PHYSICAL EXAM:  VITAL SIGNS: ED Triage Vitals [04/09/18 1937]  Enc Vitals Group     BP (!) 152/75     Pulse Rate 79     Resp 18     Temp 98.2 F (36.8 C)     Temp Source Oral     SpO2 99 %     Weight 147 lb 14.9 oz (67.1 kg)     Height 5\' 2"  (1.575 m)     Head Circumference      Peak Flow      Pain Score 9     Pain Loc      Pain Edu?      Excl. in [place]?    Constitutional: Alert and oriented. Well appearing and in no distress. Eyes: Normal exam ENT   Head: Normocephalic and atraumatic.   Mouth/Throat: Mucous membranes are moist. Cardiovascular: Normal rate, regular rhythm. No murmur Respiratory: Normal respiratory effort without tachypnea nor retractions. Breath sounds are clear.  Port present to left chest. Gastrointestinal: Soft and nontender. No distention.   Musculoskeletal: Nontender with normal range of motion in all extremities. No lower extremity tenderness or edema. Neurologic:  Normal speech and language. No gross focal neurologic deficits Skin:  Skin is warm, dry and intact.  Psychiatric: Mood and affect are normal.   ____________________________________________    EKG  EKG viewed and interpreted by myself shows a normal sinus rhythm at 77 bpm with a narrow QRS, normal axis, normal intervals, patient does have what appears to be lateral T wave inversions.  EKG appears largely unchanged compared to prior 10/20/2016.  ____________________________________________    RADIOLOGY  Chest x-ray is negative for acute  abnormality  ____________________________________________   INITIAL IMPRESSION / ASSESSMENT AND PLAN / ED COURSE  Pertinent labs & imaging results that were available during my care of the patient were reviewed by me and considered in my medical decision making (see chart for details).  Patient presents to the emergency department for intermittent chest pain since yesterday.  No history of chest pain previously.  Denies any pleuritic nature.  States the pain is mild currently but was severe yesterday.  Differential would include ACS, chest wall pain, dissection or PE, pneumonia.  Reassuringly x-ray shows no acute abnormality.  EKG is reassuring.  We will check labs and continue to closely monitor.    Patient's labs have resulted showing elevated troponin of 2.7.  Given the patient's complaint of sharp chest pain radiating to her back in both shoulders we will proceed with CT angiography of the chest to help rule out dissection.  If CTA is negative for dissection we will start the patient on a heparin infusion and admit for NSTEMI.  I discussed this with the patient and family they are agreeable to this plan of care.  CT angiography is negative.  Given the troponin elevation 2.71 we will admit to the hospitalist service.  We will start the patient on IV heparin.  Patient agreeable to plan of care.  CRITICAL CARE Performed by: Harvest Dark   Total critical care time: 30 minutes  Critical care time was exclusive of separately billable procedures and treating other patients.  Critical care was necessary to treat or prevent imminent or life-threatening deterioration.  Critical care was time spent personally by me on the following activities: development of treatment plan with patient and/or surrogate as well as nursing, discussions with consultants, evaluation of patient's response to treatment, examination of patient, obtaining history from patient or surrogate, ordering and performing  treatments and interventions, ordering and review of laboratory studies, ordering and review of radiographic studies, pulse oximetry and re-evaluation of patient's condition.   ____________________________________________   FINAL CLINICAL IMPRESSION(S) / ED DIAGNOSES  NSTEMI    Harvest Dark, MD 04/09/18 2330

## 2018-04-10 ENCOUNTER — Inpatient Hospital Stay (HOSPITAL_COMMUNITY)
Admit: 2018-04-10 | Discharge: 2018-04-10 | Disposition: A | Discharge status: Home / Self Care | Payer: Medicare PPO | Attending: Internal Medicine | Admitting: Internal Medicine

## 2018-04-10 ENCOUNTER — Encounter
Admission: EM | Disposition: A | Discharge status: Home / Self Care | Payer: Self-pay | Source: Home / Self Care | Attending: Internal Medicine

## 2018-04-10 DIAGNOSIS — Z171 Estrogen receptor negative status [ER-]: Secondary | ICD-10-CM

## 2018-04-10 DIAGNOSIS — J432 Centrilobular emphysema: Secondary | ICD-10-CM

## 2018-04-10 DIAGNOSIS — E782 Mixed hyperlipidemia: Secondary | ICD-10-CM

## 2018-04-10 DIAGNOSIS — I2510 Atherosclerotic heart disease of native coronary artery without angina pectoris: Secondary | ICD-10-CM

## 2018-04-10 DIAGNOSIS — C50411 Malignant neoplasm of upper-outer quadrant of right female breast: Secondary | ICD-10-CM

## 2018-04-10 DIAGNOSIS — I10 Essential (primary) hypertension: Secondary | ICD-10-CM

## 2018-04-10 DIAGNOSIS — I214 Non-ST elevation (NSTEMI) myocardial infarction: Principal | ICD-10-CM

## 2018-04-10 DIAGNOSIS — I340 Nonrheumatic mitral (valve) insufficiency: Secondary | ICD-10-CM

## 2018-04-10 HISTORY — PX: CORONARY STENT INTERVENTION: CATH118234

## 2018-04-10 HISTORY — PX: LEFT HEART CATH AND CORONARY ANGIOGRAPHY: CATH118249

## 2018-04-10 LAB — CBC
HCT: 32.3 % — ABNORMAL LOW (ref 36.0–46.0)
Hemoglobin: 9.6 g/dL — ABNORMAL LOW (ref 12.0–15.0)
MCH: 25.5 pg — ABNORMAL LOW (ref 26.0–34.0)
MCHC: 29.7 g/dL — ABNORMAL LOW (ref 30.0–36.0)
MCV: 85.9 fL (ref 80.0–100.0)
NRBC: 0 % (ref 0.0–0.2)
Platelets: 291 10*3/uL (ref 150–400)
RBC: 3.76 MIL/uL — ABNORMAL LOW (ref 3.87–5.11)
RDW: 17.9 % — ABNORMAL HIGH (ref 11.5–15.5)
WBC: 9.4 10*3/uL (ref 4.0–10.5)

## 2018-04-10 LAB — ECHOCARDIOGRAM COMPLETE
Height: 62 in
Weight: 2373.91 oz

## 2018-04-10 LAB — BASIC METABOLIC PANEL
Anion gap: 7 (ref 5–15)
BUN: 17 mg/dL (ref 8–23)
CO2: 25 mmol/L (ref 22–32)
Calcium: 8.6 mg/dL — ABNORMAL LOW (ref 8.9–10.3)
Chloride: 108 mmol/L (ref 98–111)
Creatinine, Ser: 1.14 mg/dL — ABNORMAL HIGH (ref 0.44–1.00)
GFR calc Af Amer: 54 mL/min — ABNORMAL LOW (ref >60)
GFR calc non Af Amer: 47 mL/min — ABNORMAL LOW (ref >60)
Glucose, Bld: 108 mg/dL — ABNORMAL HIGH (ref 70–99)
Potassium: 3.2 mmol/L — ABNORMAL LOW (ref 3.5–5.1)
Sodium: 140 mmol/L (ref 135–145)

## 2018-04-10 LAB — LIPID PANEL
Cholesterol: 189 mg/dL (ref 0–200)
HDL: 40 mg/dL — AB (ref >40)
LDL Cholesterol: 134 mg/dL — ABNORMAL HIGH (ref 0–99)
Total CHOL/HDL Ratio: 4.7 RATIO
Triglycerides: 77 mg/dL (ref <150)
VLDL: 15 mg/dL (ref 0–40)

## 2018-04-10 LAB — PROTIME-INR
INR: 0.98
Prothrombin Time: 12.9 seconds (ref 11.4–15.2)

## 2018-04-10 LAB — HEPARIN LEVEL (UNFRACTIONATED): Heparin Unfractionated: 0.73 IU/mL — ABNORMAL HIGH (ref 0.30–0.70)

## 2018-04-10 LAB — TROPONIN I
Troponin I: 8.32 ng/mL (ref <0.03)
Troponin I: 15.26 ng/mL (ref <0.03)
Troponin I: 18.3 ng/mL (ref <0.03)

## 2018-04-10 LAB — MAGNESIUM: Magnesium: 1.7 mg/dL (ref 1.7–2.4)

## 2018-04-10 LAB — APTT: aPTT: 33 seconds (ref 24–36)

## 2018-04-10 LAB — POCT ACTIVATED CLOTTING TIME: Activated Clotting Time: 455 seconds

## 2018-04-10 LAB — PHOSPHORUS: Phosphorus: 3.3 mg/dL (ref 2.5–4.6)

## 2018-04-10 SURGERY — LEFT HEART CATH AND CORONARY ANGIOGRAPHY
Anesthesia: Moderate Sedation

## 2018-04-10 MED ORDER — POTASSIUM CHLORIDE CRYS ER 20 MEQ PO TBCR
20.0000 meq | EXTENDED_RELEASE_TABLET | Freq: Every day | ORAL | Status: DC
  Administered 2018-04-10: 20 meq via ORAL
  Filled 2018-04-10: qty 1

## 2018-04-10 MED ORDER — ROSUVASTATIN CALCIUM 10 MG PO TABS
20.0000 mg | ORAL_TABLET | Freq: Every day | ORAL | Status: DC
  Administered 2018-04-10 – 2018-04-11 (×2): 20 mg via ORAL
  Filled 2018-04-10: qty 2
  Filled 2018-04-10: qty 1
  Filled 2018-04-10: qty 2
  Filled 2018-04-10: qty 1

## 2018-04-10 MED ORDER — FENTANYL CITRATE (PF) 100 MCG/2ML IJ SOLN
INTRAMUSCULAR | Status: AC
  Filled 2018-04-10: qty 2

## 2018-04-10 MED ORDER — CARVEDILOL 3.125 MG PO TABS
3.1250 mg | ORAL_TABLET | Freq: Two times a day (BID) | ORAL | Status: DC
  Administered 2018-04-11 – 2018-04-12 (×3): 3.125 mg via ORAL
  Filled 2018-04-10 (×4): qty 1

## 2018-04-10 MED ORDER — CLOPIDOGREL BISULFATE 75 MG PO TABS
75.0000 mg | ORAL_TABLET | Freq: Every day | ORAL | Status: DC
  Administered 2018-04-11 – 2018-04-12 (×2): 75 mg via ORAL
  Filled 2018-04-10 (×2): qty 1

## 2018-04-10 MED ORDER — PANTOPRAZOLE SODIUM 40 MG PO TBEC
40.0000 mg | DELAYED_RELEASE_TABLET | Freq: Every day | ORAL | Status: DC
  Administered 2018-04-10 – 2018-04-12 (×3): 40 mg via ORAL
  Filled 2018-04-10 (×3): qty 1

## 2018-04-10 MED ORDER — CLOPIDOGREL BISULFATE 75 MG PO TABS
ORAL_TABLET | ORAL | Status: DC | PRN
  Administered 2018-04-10: 600 mg via ORAL

## 2018-04-10 MED ORDER — BIVALIRUDIN TRIFLUOROACETATE 250 MG IV SOLR
INTRAVENOUS | Status: AC
  Filled 2018-04-10: qty 250

## 2018-04-10 MED ORDER — SODIUM CHLORIDE 0.9 % IV SOLN: 250.0000 mL | INTRAVENOUS | Status: DC | PRN

## 2018-04-10 MED ORDER — MAGNESIUM SULFATE 2 GM/50ML IV SOLN: 2.0000 g | Freq: Once | INTRAVENOUS | Status: DC

## 2018-04-10 MED ORDER — POTASSIUM CHLORIDE CRYS ER 20 MEQ PO TBCR: 20.0000 meq | EXTENDED_RELEASE_TABLET | Freq: Every day | ORAL | Status: DC

## 2018-04-10 MED ORDER — OXYCODONE HCL 5 MG PO TABS: 5.0000 mg | ORAL_TABLET | ORAL | Status: DC | PRN

## 2018-04-10 MED ORDER — ASPIRIN EC 81 MG PO TBEC
81.0000 mg | DELAYED_RELEASE_TABLET | Freq: Every day | ORAL | Status: DC
  Administered 2018-04-10 – 2018-04-12 (×3): 81 mg via ORAL
  Filled 2018-04-10 (×3): qty 1

## 2018-04-10 MED ORDER — ONDANSETRON HCL 4 MG/2ML IJ SOLN: 4.0000 mg | Freq: Four times a day (QID) | INTRAMUSCULAR | Status: DC | PRN

## 2018-04-10 MED ORDER — MELATONIN 5 MG PO TABS
10.0000 mg | ORAL_TABLET | Freq: Every evening | ORAL | Status: DC | PRN
  Filled 2018-04-10: qty 2

## 2018-04-10 MED ORDER — MIDAZOLAM HCL 2 MG/2ML IJ SOLN
INTRAMUSCULAR | Status: DC | PRN
  Administered 2018-04-10: 1 mg via INTRAVENOUS

## 2018-04-10 MED ORDER — SODIUM CHLORIDE 0.9 % WEIGHT BASED INFUSION
1.0000 mL/kg/h | INTRAVENOUS | Status: DC
  Administered 2018-04-10: 1 mL/kg/h via INTRAVENOUS

## 2018-04-10 MED ORDER — HEPARIN (PORCINE) IN NACL 1000-0.9 UT/500ML-% IV SOLN
INTRAVENOUS | Status: AC
  Filled 2018-04-10: qty 1000

## 2018-04-10 MED ORDER — LETROZOLE 2.5 MG PO TABS
2.5000 mg | ORAL_TABLET | Freq: Every day | ORAL | Status: DC
  Administered 2018-04-10 – 2018-04-12 (×3): 2.5 mg via ORAL
  Filled 2018-04-10 (×3): qty 1

## 2018-04-10 MED ORDER — ENOXAPARIN SODIUM 40 MG/0.4ML ~~LOC~~ SOLN
40.0000 mg | SUBCUTANEOUS | Status: DC
  Administered 2018-04-11 – 2018-04-12 (×2): 40 mg via SUBCUTANEOUS
  Filled 2018-04-10 (×2): qty 0.4

## 2018-04-10 MED ORDER — MAGNESIUM SULFATE IN D5W 1-5 GM/100ML-% IV SOLN
1.0000 g | Freq: Once | INTRAVENOUS | Status: AC
  Administered 2018-04-10: 1 g via INTRAVENOUS
  Filled 2018-04-10: qty 100

## 2018-04-10 MED ORDER — SODIUM CHLORIDE 0.9% FLUSH
3.0000 mL | Freq: Two times a day (BID) | INTRAVENOUS | Status: DC
  Administered 2018-04-10 – 2018-04-11 (×3): 3 mL via INTRAVENOUS

## 2018-04-10 MED ORDER — SODIUM CHLORIDE 0.9% FLUSH: 3.0000 mL | INTRAVENOUS | Status: DC | PRN

## 2018-04-10 MED ORDER — CLOPIDOGREL BISULFATE 75 MG PO TABS
ORAL_TABLET | ORAL | Status: AC
  Filled 2018-04-10: qty 8

## 2018-04-10 MED ORDER — BACLOFEN 10 MG PO TABS
10.0000 mg | ORAL_TABLET | Freq: Every day | ORAL | Status: DC
  Administered 2018-04-10 – 2018-04-11 (×2): 10 mg via ORAL
  Filled 2018-04-10 (×4): qty 1

## 2018-04-10 MED ORDER — SODIUM CHLORIDE 0.9 % IV SOLN
INTRAVENOUS | Status: AC | PRN
  Administered 2018-04-10: 1.75 mg/kg/h via INTRAVENOUS

## 2018-04-10 MED ORDER — ACETAMINOPHEN 325 MG PO TABS: 650.0000 mg | ORAL_TABLET | Freq: Four times a day (QID) | ORAL | Status: DC | PRN

## 2018-04-10 MED ORDER — DULOXETINE HCL 30 MG PO CPEP
90.0000 mg | ORAL_CAPSULE | ORAL | Status: DC
  Administered 2018-04-10 – 2018-04-12 (×3): 90 mg via ORAL
  Filled 2018-04-10 (×3): qty 3

## 2018-04-10 MED ORDER — SODIUM CHLORIDE 0.9 % IV SOLN
INTRAVENOUS | Status: AC
  Administered 2018-04-10: 50 mL/h via INTRAVENOUS

## 2018-04-10 MED ORDER — BIVALIRUDIN BOLUS VIA INFUSION - CUPID
INTRAVENOUS | Status: DC | PRN
  Administered 2018-04-10: 50.775 mg via INTRAVENOUS

## 2018-04-10 MED ORDER — NITROGLYCERIN 5 MG/ML IV SOLN
INTRAVENOUS | Status: AC
  Filled 2018-04-10: qty 10

## 2018-04-10 MED ORDER — IOPAMIDOL (ISOVUE-300) INJECTION 61%
INTRAVENOUS | Status: DC | PRN
  Administered 2018-04-10: 70 mL via INTRA_ARTERIAL
  Administered 2018-04-10: 175 mL via INTRA_ARTERIAL

## 2018-04-10 MED ORDER — ASPIRIN 81 MG PO CHEW
CHEWABLE_TABLET | ORAL | Status: AC
  Filled 2018-04-10: qty 3

## 2018-04-10 MED ORDER — ACETAMINOPHEN 650 MG RE SUPP: 650.0000 mg | Freq: Four times a day (QID) | RECTAL | Status: DC | PRN

## 2018-04-10 MED ORDER — DULOXETINE HCL 30 MG PO CPEP: 60.0000 mg | ORAL_CAPSULE | ORAL | Status: DC

## 2018-04-10 MED ORDER — ASPIRIN 81 MG PO CHEW: 81.0000 mg | CHEWABLE_TABLET | ORAL | Status: DC

## 2018-04-10 MED ORDER — FENTANYL CITRATE (PF) 100 MCG/2ML IJ SOLN
INTRAMUSCULAR | Status: DC | PRN
  Administered 2018-04-10 (×3): 25 ug via INTRAVENOUS

## 2018-04-10 MED ORDER — NITROGLYCERIN 1 MG/10 ML FOR IR/CATH LAB
INTRA_ARTERIAL | Status: DC | PRN
  Administered 2018-04-10 (×2): 200 ug via INTRACORONARY

## 2018-04-10 MED ORDER — MOMETASONE FURO-FORMOTEROL FUM 200-5 MCG/ACT IN AERO
2.0000 | INHALATION_SPRAY | Freq: Two times a day (BID) | RESPIRATORY_TRACT | Status: DC
  Administered 2018-04-10 – 2018-04-11 (×4): 2 via RESPIRATORY_TRACT
  Filled 2018-04-10: qty 8.8

## 2018-04-10 MED ORDER — ONDANSETRON HCL 4 MG PO TABS: 4.0000 mg | ORAL_TABLET | Freq: Four times a day (QID) | ORAL | Status: DC | PRN

## 2018-04-10 MED ORDER — ASPIRIN 81 MG PO CHEW
CHEWABLE_TABLET | ORAL | Status: DC | PRN
  Administered 2018-04-10: 243 mg via ORAL

## 2018-04-10 MED ORDER — MIDAZOLAM HCL 2 MG/2ML IJ SOLN
INTRAMUSCULAR | Status: AC
  Filled 2018-04-10: qty 2

## 2018-04-10 MED ORDER — POTASSIUM CHLORIDE CRYS ER 20 MEQ PO TBCR
40.0000 meq | EXTENDED_RELEASE_TABLET | Freq: Once | ORAL | Status: AC
  Administered 2018-04-10: 40 meq via ORAL
  Filled 2018-04-10: qty 2

## 2018-04-10 MED ORDER — MONTELUKAST SODIUM 10 MG PO TABS
10.0000 mg | ORAL_TABLET | Freq: Every day | ORAL | Status: DC
  Administered 2018-04-10 – 2018-04-11 (×2): 10 mg via ORAL
  Filled 2018-04-10 (×2): qty 1

## 2018-04-10 SURGICAL SUPPLY — 22 items
BALLN TREK RX 2.25X15 (BALLOONS) ×3
BALLN ~~LOC~~ EUPHORA RX 2.5X15 (BALLOONS) ×3
BALLN ~~LOC~~ TREK RX 2.0X8 (BALLOONS) ×3
BALLOON TREK RX 2.25X15 (BALLOONS) ×1 IMPLANT
BALLOON ~~LOC~~ EUPHORA RX 2.5X15 (BALLOONS) ×1 IMPLANT
BALLOON ~~LOC~~ TREK RX 2.0X8 (BALLOONS) ×1 IMPLANT
CATH INFINITI 5FR ANG PIGTAIL (CATHETERS) ×3 IMPLANT
CATH INFINITI 5FR JL4 (CATHETERS) ×3 IMPLANT
CATH INFINITI JR4 5F (CATHETERS) ×3 IMPLANT
CATH VISTA GUIDE 6FR XBLAD4 (CATHETERS) ×3 IMPLANT
DEVICE CLOSURE MYNXGRIP 6/7F (Vascular Products) ×3 IMPLANT
DEVICE INFLAT 30 PLUS (MISCELLANEOUS) ×3 IMPLANT
KIT MANI 3VAL PERCEP (MISCELLANEOUS) ×3 IMPLANT
NEEDLE PERC 18GX7CM (NEEDLE) ×3 IMPLANT
NEEDLE SMART REG 18GX2-3/4 (NEEDLE) IMPLANT
PACK CARDIAC CATH (CUSTOM PROCEDURE TRAY) ×3 IMPLANT
SHEATH AVANTI 5FR X 11CM (SHEATH) ×3 IMPLANT
SHEATH AVANTI 6FR X 11CM (SHEATH) ×3 IMPLANT
STENT RESOLUTE ONYX 2.0X12 (Permanent Stent) ×3 IMPLANT
STENT RESOLUTE ONYX 2.5X18 (Permanent Stent) ×3 IMPLANT
WIRE GUIDERIGHT .035X150 (WIRE) ×3 IMPLANT
WIRE RUNTHROUGH .014X180CM (WIRE) ×3 IMPLANT

## 2018-04-10 NOTE — Progress Notes (Signed)
Patient returned to unit at approx 1630.

## 2018-04-10 NOTE — Progress Notes (Signed)
Pharmacy Electrolyte Monitoring Consult:  Pharmacy consulted to assist in monitoring and replacing electrolytes in this 77 y.o. female admitted on 04/09/2018 with NSTEMI.   Labs:  Sodium (mmol/L)  Date Value  04/10/2018 140  04/08/2014 141   Potassium (mmol/L)  Date Value  04/10/2018 3.2 (L)  04/08/2014 3.9   Magnesium (mg/dL)  Date Value  04/10/2018 1.7   Phosphorus (mg/dL)  Date Value  04/10/2018 3.3   Calcium (mg/dL)  Date Value  04/10/2018 8.6 (L)   Calcium, Total (mg/dL)  Date Value  04/08/2014 8.8   Albumin (g/dL)  Date Value  03/12/2018 3.5    Assessment/Plan: Patient received potassium 11mEq PO x 1. Will order potassium 60mEq PO x 1 and magnesium 1g IV x 1.   Goal Potassium ~ 4 and goal magnesium ~ 2.   Will obtain BMP/Magnesium with am labs.   Pharmacy will continue to monitor and adjust per consult.    Arian Mcquitty L 04/10/2018 4:40 PM

## 2018-04-10 NOTE — Progress Notes (Signed)
Dr. Marcille Blanco called via phone and updated on patient's elevated troponin of 8.3. No new order for troponin level. Physician was also made aware of potassium level of 3.2. Verbal order to give 10 AM potassium chloride pill now. Will continue to monitor patient.

## 2018-04-10 NOTE — Progress Notes (Signed)
Patient ID: Krista Cisneros, female   DOB: 28-Aug-1940, 76 y.o.   MRN: [id number]  [employer] PROGRESS NOTE  Morine Kohlman TOI:[id number] DOB: 1940-06-16 DOA: 04/09/2018 PCP: Sharyne Peach, MD  HPI/Subjective: Patient came in with chest pain and shortness of breath.  Troponin was 2.71 on presentation and went up to 15.26.  Patient currently not having any chest pain at this time.  Objective: Vitals:   04/10/18 1100 04/10/18 1212  BP:  115/71  Pulse: 73 84  Resp: 17 20  Temp:  99 F (37.2 C)  SpO2: 96% 95%    Filed Weights   04/09/18 1937 04/10/18 0300 04/10/18 1020  Weight: 67.1 kg 67.3 kg 67.7 kg    ROS: Review of Systems  Constitutional: Negative for chills and fever.  Eyes: Negative for blurred vision.  Respiratory: Negative for cough and shortness of breath.   Cardiovascular: Negative for chest pain.  Gastrointestinal: Negative for abdominal pain, constipation, diarrhea, nausea and vomiting.  Genitourinary: Negative for dysuria.  Musculoskeletal: Negative for joint pain.  Neurological: Negative for dizziness and headaches.   Exam: Physical Exam  Constitutional: She is oriented to person, place, and time.  HENT:  Nose: No mucosal edema.  Mouth/Throat: No oropharyngeal exudate or posterior oropharyngeal edema.  Eyes: Pupils are equal, round, and reactive to light. Conjunctivae, EOM and lids are normal.  Neck: No JVD present. Carotid bruit is not present. No edema present. No thyroid mass and no thyromegaly present.  Cardiovascular: S1 normal and S2 normal. Exam reveals no gallop.  No murmur heard. Pulses:      Dorsalis pedis pulses are 2+ on the right side and 2+ on the left side.  Respiratory: No respiratory distress. She has no wheezes. She has no rhonchi. She has no rales.  GI: Soft. Bowel sounds are normal. There is no abdominal tenderness.  Musculoskeletal:     Right ankle: She exhibits no swelling.     Left ankle: She exhibits no swelling.   Lymphadenopathy:    She has no cervical adenopathy.  Neurological: She is alert and oriented to person, place, and time. No cranial nerve deficit.  Skin: Skin is warm. No rash noted. Nails show no clubbing.  Psychiatric: She has a normal mood and affect.      Data Reviewed: Basic Metabolic Panel: Recent Labs  Lab 04/09/18 2050 04/10/18 0322 04/10/18 0821  NA 140 140  --   K 3.4* 3.2*  --   CL 109 108  --   CO2 23 25  --   GLUCOSE 127* 108*  --   BUN 20 17  --   CREATININE 1.20* 1.14*  --   CALCIUM 8.6* 8.6*  --   MG  --   --  1.7  PHOS  --   --  3.3   CBC: Recent Labs  Lab 04/09/18 2050 04/10/18 0322  WBC 10.8* 9.4  HGB 10.4* 9.6*  HCT 34.3* 32.3*  MCV 85.3 85.9  PLT 291 291   Cardiac Enzymes: Recent Labs  Lab 04/09/18 2050 04/10/18 0322 04/10/18 0821  TROPONINI 2.71* 8.32* 15.26*     Studies: Dg Chest 2 View  Result Date: 04/09/2018 CLINICAL DATA:  Midsternal chest pain for 2 days EXAM: CHEST - 2 VIEW COMPARISON:  05/01/2008 FINDINGS: Cardiac shadow is stable. Left chest wall port is again noted and stable. Emphysematous changes are seen with paucity of lung markings in the apices. No focal infiltrate or sizable effusion is seen. No bony abnormality  is noted. Stable aortic calcifications are seen. IMPRESSION: COPD without acute abnormality. Electronically Signed   By: Inez Catalina M.D.   On: 04/09/2018 20:06   Ct Angio Chest/abd/pel For Dissection W And/or Wo Contrast  Result Date: 04/09/2018 CLINICAL DATA:  Severe midsternal chest pain radiating to the back. EXAM: CT ANGIOGRAPHY CHEST, ABDOMEN AND PELVIS TECHNIQUE: Multidetector CT imaging through the chest, abdomen and pelvis was performed using the standard protocol during bolus administration of intravenous contrast. Multiplanar reconstructed images and MIPs were obtained and reviewed to evaluate the vascular anatomy. CONTRAST:  55mL ISOVUE-370 IOPAMIDOL (ISOVUE-370) INJECTION 76% COMPARISON:  Chest  x-ray from same day. CT abdomen pelvis dated March 26, 2017. CTA chest dated January 22, 2017. FINDINGS: CTA CHEST FINDINGS Cardiovascular: Preferential opacification of the thoracic aorta. No evidence of thoracic aortic aneurysm or dissection. Coronary, aortic arch, and branch vessel atherosclerotic vascular disease. Normal heart size. No pericardial effusion. No pulmonary embolism. Mediastinum/Nodes: No enlarged mediastinal, hilar, or axillary lymph nodes. Thyroid gland, trachea, and esophagus demonstrate no significant findings. Unchanged moderate hiatal hernia. Lungs/Pleura: Severe bullous emphysema again noted. No focal consolidation, pleural effusion, or pneumothorax. New 7 x 5 mm irregular nodule in the superior segment of the left lower lobe (series 6, image 76). Musculoskeletal: Prominent skin thickening and subcutaneous edema of the right breast likely reflects post radiation change. No acute or significant osseous findings. Review of the MIP images confirms the above findings. CTA ABDOMEN AND PELVIS FINDINGS VASCULAR Aorta: Normal caliber aorta without aneurysm, dissection, vasculitis or significant stenosis. Atherosclerosis. Celiac: Patent without evidence of aneurysm, dissection, vasculitis or significant stenosis. SMA: Patent without evidence of aneurysm, dissection, vasculitis or significant stenosis. Renals: Both renal arteries are patent without evidence of aneurysm, dissection, vasculitis, fibromuscular dysplasia or significant stenosis. IMA: Patent without evidence of aneurysm, dissection, vasculitis or significant stenosis. Inflow: Patent without evidence of aneurysm, dissection, vasculitis or significant stenosis. Veins: No obvious venous abnormality within the limitations of this arterial phase study. Review of the MIP images confirms the above findings. NON-VASCULAR Hepatobiliary: No focal liver abnormality is seen. No gallstones, gallbladder wall thickening, or biliary dilatation. Pancreas:  Unremarkable. No pancreatic ductal dilatation or surrounding inflammatory changes. Spleen: Normal in size without focal abnormality. Adrenals/Urinary Tract: The adrenal glands are unremarkable. Unchanged 2.0 cm right renal cyst. Subcentimeter low-density lesion in the left kidney remains too small to characterize. No renal or ureteral calculi. No hydronephrosis. Bladder is unremarkable. Stomach/Bowel: Moderate hiatal hernia. The stomach is otherwise within normal limits. No bowel wall thickening, distention, or surrounding inflammatory changes. Normal appendix. Lymphatic: No enlarged abdominal or pelvic lymph nodes. Reproductive: Status post hysterectomy. No adnexal masses. Other: Prior right inguinal hernia repair. Unchanged small fat containing left inguinal hernia. No free fluid or pneumoperitoneum. Musculoskeletal: No acute or significant osseous findings. 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 Review of the MIP images confirms the above findings. IMPRESSION: Vascular: 1. No evidence of thoracoabdominal aortic aneurysm, dissection, penetrating ulcer, or intramural hematoma. 2.  Aortic atherosclerosis (ICD10-I70.0). Chest: 1.  No acute intrathoracic process. 2. New 6 mm aggregate pulmonary nodule in the left lower lobe. Non-contrast chest CT at 6-12 months is recommended. If the nodule is stable at time of repeat CT, then future CT at 18-24 months (from today's scan) is considered optional for low-risk patients, but is recommended for high-risk patients. This recommendation follows the consensus statement: Guidelines for Management of Incidental Pulmonary Nodules Detected on CT Images: From the Fleischner Society 2017; Radiology 2017; 284:228-243. 3.  Emphysema (ICD10-J43.9). 4. Post radiation changes of the right breast. Abdomen and pelvis: 1.  No acute intra-abdominal process. 2. Unchanged moderate hiatal hernia. Electronically Signed   By: Titus Dubin M.D.   On: 04/09/2018 23:03     Scheduled Meds: . [START ON 04/11/2018] aspirin  81 mg Oral Pre-Cath  . aspirin EC  81 mg Oral Daily  . baclofen  10 mg Oral QHS  . DULoxetine  90 mg Oral [id number]  . letrozole  2.5 mg Oral Daily  . mometasone-formoterol  2 puff Inhalation BID  . montelukast  10 mg Oral QHS  . pantoprazole  40 mg Oral Daily   Continuous Infusions: . sodium chloride 1 mL/kg/hr (04/10/18 1029)  . heparin 850 Units/hr (04/10/18 1140)    Assessment/Plan:  1. NSTEMI.  Patient given aspirin and heparin drip.  Blood pressure initially too low for beta-blocker but will add.  Add statin. 2. Breast cancer on letrozole 3. GERD on Protonix 4. Hyperlipidemia unspecified.  LDL 134.  Goal less than 100. 5. COPD.  Respiratory status stable continue inhalers.  Code Status:     Code Status Orders  (From admission, onward)         Start     Ordered   04/10/18 0243  Full code  Continuous     04/10/18 0242        Code Status History    Date Active Date Inactive Code Status Order ID Comments User Context   03/27/2017 0121 03/28/2017 1812 Full Code [id number]  Jules Husbands, MD Inpatient   02/07/2017 1703 02/10/2017 1613 Full Code [id number]  Bettey Costa, MD Inpatient      Disposition Plan: To be determined  Consultants:  Cardiology  Critical care team  Time spent: 28 minutes  [place]

## 2018-04-10 NOTE — Care Management Note (Signed)
Case Management Note  Patient Details  Name: Krista Cisneros MRN: [id number] Date of Birth: 05/22/1940  Subjective/Objective:    Patient admitted with NSTEMI, currently in the ICU but floor with telemetry status.  Patient will go to the cath lab this afternoon.  Patient reports that she is from home and she lives with her daughter and son in law.  Patient reports that she does not have a cardiac history but does have COPD.  Patient reports she is independent in ADL's and she drives.  PCP verified as Dr. Iona Beard who she follows up with every 3-6 months.  Pulmonologist is Dr. Vella Kohler who she has not needed to see recently.  Patient does not wear oxygen at home.  Patient has a history of breast cancer, she reports it is in remission.  RNCM will cont to follow to assess for discharge needs. Doran Clay RN BSN [phone]                  Action/Plan:   Expected Discharge Date:                  Expected Discharge Plan:     In-House Referral:     Discharge planning Services     Post Acute Care Choice:    Choice offered to:     DME Arranged:    DME Agency:     HH Arranged:    HH Agency:     Status of Service:     If discussed at [place] of Stay Meetings, dates discussed:    Additional Comments:  Shelbie Hutching, RN 04/10/2018, 1:40 PM

## 2018-04-10 NOTE — Progress Notes (Signed)
Dx with NSTEMI VS stable  S/p cath-report pending  Continue heparin  Follow up cardiology recs.

## 2018-04-10 NOTE — Progress Notes (Signed)
eLink Physician-Brief Progress Note Patient Name: Krista Cisneros DOB: 1940-09-24 MRN: [id number]   Date of Service  04/10/2018  HPI/Events of Note  77/F with HTN, dyslipidemia, presents with chest pain, found to have NSTEMI.  eICU Interventions  Heparin gtt.  Pt is hemodynamically stable.  Check 2d echo. Cardiology consult. Beta blocker when BP tolerates. Statin. ASA. Check lipid panel.      Intervention Category Evaluation Type: New Patient Evaluation  Elsie Lincoln 04/10/2018, 3:08 AM

## 2018-04-10 NOTE — Progress Notes (Signed)
Pt left unit at this time to have left heart cath done.

## 2018-04-10 NOTE — Consult Note (Addendum)
Cardiology Consultation:   Patient ID: Krista Cisneros MRN: [id number]; DOB: 1940-08-29  Admit date: 04/09/2018 Date of Consult: 04/10/2018  Primary Care Provider: Sharyne Peach, MD Primary Cardiologist:[facility], Dr. Rockey Situ Primary Electrophysiologist:  None    Patient Profile:   Krista Cisneros is a 77 y.o. female with a hx of HTN, HLD, GERD, COPD, former smoker (quit 1990), and right-sided breast cancer (s/p radiation with ACW port) who is being seen today for the evaluation of NSTEMI at the request of Dr. Jannifer [name].  History of Present Illness:   Krista Cisneros is a 77 year old female with PMH as above and no history of cardiac disease.  03/12/2018: Last seen in the [facility] emergency department for urinary retention and rectal bleeding and discharged home on Foley catheter.  04/09/2018: Patient presented to [facility] ED with complaint of progressive fatigue over the last week and intermittent, nonpleuritic, sharp chest pain over the last 2-3 days. She reported the chest pain episodes had been intermittent for 2-3 days but considered mild in nature and usually self resolving reportedly as they lasted only a few minutes. These chest pain episodes progressed until her most recent episode on 12/17 (early in the morning) which was rated 8/10 in severity and lasted for approximately 2 hours.  She reported the chest pain as located in the central and anterior chest area and radiating to her back as well as down both arms.  Associated symptoms included shortness of breath, nausea, lightheadedness, diaphoresis.  The patient reportedly took 324 mg ASA at home then presented to the ED.  In the ED, she was found to be hypertensive, hypokalemic, hyperglycemic, hypocalcemic, anemic with a bump in Cr and leukocytosis.  Initial troponin was elevated at 2.71. Vitals: BP 152/75, HR 79, RR 18, T 98.73F, SPO2 99% Labs: Sodium 140, potassium 3.4, glucose 127, bump in creatinine 1.20 (baseline ~1.07), BUN 20,  calcium 8.6, WBC 10.8, RBC 4.02, hemoglobin 10.4, hematocrit 34.3, platelets 291 EKG: SR, no acute ST, 77 bpm, nonspecific ST-T wave changes CXR: COPD without acute abnormality CTA: Negative for dissection.  Preferential opacification of thoracic area.  No evidence of thoracic aortic aneurysm or dissection.  Coronary, aortic arch, and branch vessel atherosclerosis vascular disease.  Normal heart size, no pericardial effusion.  No pulmonary embolism Meds: Started on heparin infusion and admitted for NSTEMI  The patient stated the pain improved while in the ED and was rated closer to 3/4 out of 10 at that time.  Since admission, troponin elevation from 2.71  8.3  15.26  Past Medical History:  Diagnosis Date  . Anxiety   . Arthritis   . Cancer [facility])    Right Breast Cancer  . COPD (chronic obstructive pulmonary disease) ([place])   . Depression   . Dyspnea    with exertion  . GERD (gastroesophageal reflux disease)   . Hyperlipidemia   . Hypertension   . Personal history of chemotherapy   . Personal history of radiation therapy     Past Surgical History:  Procedure Laterality Date  . ABDOMINAL HYSTERECTOMY  1990   Partial  . BREAST BIOPSY Right 10/04/2016   axilla lymph node and axillay tail mass biopsy. invasive mammary carcinoma  . BREAST LUMPECTOMY Right 03/21/2017  . BREAST LUMPECTOMY Right 05/21/2017   re-excision  . BREAST LUMPECTOMY WITH NEEDLE LOCALIZATION Right 03/21/2017   Procedure: BREAST LUMPECTOMY WITH NEEDLE LOCALIZATION;  Surgeon: Clayburn Pert, MD;  Location: [facility] ORS;  Service: General;  Laterality: Right;  . DILATION AND  CURETTAGE OF UTERUS    . INGUINAL HERNIA REPAIR Right 03/26/2017   Procedure: HERNIA REPAIR INGUINAL INCARCERATED;  Surgeon: Jules Husbands, MD;  Location: [facility] ORS;  Service: General;  Laterality: Right;  . PORTACATH PLACEMENT Left 10/24/2016   Procedure: INSERTION PORT-A-CATH;  Surgeon: [name], MD;  Location: [facility] ORS;  Service: General;   Laterality: Left;  . RE-EXCISION OF BREAST LUMPECTOMY Right 05/21/2017   Procedure: RE-EXCISION OF BREAST LUMPECTOMY;  Surgeon: Clayburn Pert, MD;  Location: [facility] ORS;  Service: General;  Laterality: Right;  . SENTINEL NODE BIOPSY Right 03/21/2017   Procedure: SENTINEL NODE BIOPSY;  Surgeon: Clayburn Pert, MD;  Location: [facility] ORS;  Service: General;  Laterality: Right;     Home Medications:  Prior to Admission medications   Medication Sig Start Date End Date Taking? Authorizing Provider  ADVAIR DISKUS 500-50 MCG/DOSE AEPB Inhale 1 puff into the lungs 2 (two) times daily.  05/04/16  Yes [provider]  albuterol (PROVENTIL HFA;VENTOLIN HFA) 108 (90 Base) MCG/ACT inhaler Inhale 2 puffs into the lungs every 6 (six) hours as needed for wheezing or shortness of breath.   Yes [provider]  aspirin EC 81 MG tablet Take 81 mg by mouth daily.   Yes [provider]  Aspirin-Caffeine (BAYER BACK & BODY PAIN EX ST PO) Take 2 tablets by mouth 2 (two) times daily as needed (for back pain).    Yes [provider]  baclofen (LIORESAL) 10 MG tablet Take 10 mg by mouth at bedtime. 01/14/17  Yes [provider]  co-enzyme Q-10 30 MG capsule Take 30 mg by mouth daily.   Yes [provider]  DULoxetine (CYMBALTA) 30 MG capsule Take 30 mg by mouth every morning. Take with 60 mg to total 90 mg once daily 05/04/17  Yes [provider]  DULoxetine (CYMBALTA) 60 MG capsule Take 60 mg by mouth every morning. Take with 30 mg to total 90 mg once daily 03/04/17  Yes [provider]  KLOR-CON M20 20 MEQ tablet TAKE 1 TABLET BY MOUTH EVERY DAY 11/30/17  Yes Lloyd Huger, MD  letrozole [facility]) 2.5 MG tablet Take 1 tablet (2.5 mg total) by mouth daily. 06/19/17  Yes Verlon Au, NP  lidocaine-prilocaine (EMLA) cream Apply 1 application as needed topically (for port access).   Yes [provider]  losartan-hydrochlorothiazide (HYZAAR)  100-25 MG tablet Take 1 tablet by mouth daily.  05/12/16  Yes [provider]  magnesium oxide (MAG-OX) 400 MG tablet Take 400 mg by mouth daily.   Yes [provider]  Melatonin 5 MG TABS Take 10 mg by mouth at bedtime as needed (sleep).   Yes [provider]  montelukast (SINGULAIR) 10 MG tablet Take 10 mg by mouth at bedtime.  05/23/16  Yes [provider]  Omega-3 Fatty Acids (FISH OIL) 1000 MG CAPS Take 1,000 mg by mouth daily.   Yes [provider]  omeprazole (PRILOSEC) 20 MG capsule Take 20 mg by mouth every morning.    Yes [provider]  potassium chloride SA (K-DUR,KLOR-CON) 20 MEQ tablet Take 2 tablets (40 mEq total) by mouth daily. This is in addition to ur regular dose of 63meq daily 02/10/17 03/26/18  Dustin Flock, MD  prochlorperazine (COMPAZINE) 10 MG tablet Take 1 tablet (10 mg total) by mouth every 6 (six) hours as needed (Nausea or vomiting). 10/20/16 07/31/17  Lloyd Huger, MD    Inpatient Medications: Scheduled Meds: . aspirin EC  81 mg Oral Daily  . baclofen  10 mg Oral QHS  . DULoxetine  90 mg Oral BH-q7a  . letrozole  2.5 mg Oral Daily  . mometasone-formoterol  2 puff Inhalation BID  . montelukast  10 mg Oral QHS  . pantoprazole  40 mg Oral Daily  . potassium chloride  40 mEq Oral Once   Continuous Infusions: . heparin 900 Units/hr (04/10/18 0600)   PRN Meds: acetaminophen **OR** acetaminophen, Melatonin, ondansetron **OR** ondansetron (ZOFRAN) IV, oxyCODONE  Allergies:    Allergies  Allergen Reactions  . Aspirin     Other reaction(s): Other (See Comments) GI bleeding risk  . Nsaids     Other reaction(s): Other (See Comments) Bleeding risk  . Penicillins Anaphylaxis, Swelling and Rash    Has patient had a PCN reaction causing immediate rash, facial/tongue/throat swelling, SOB or lightheadedness with hypotension: Yes Has patient had a PCN reaction causing severe rash involving mucus membranes or  skin necrosis: No Has patient had a PCN reaction that required hospitalization: No  Has patient had a PCN reaction occurring within the last 10 years: No If all of the above answers are "NO", then may proceed with Cephalosporin use.   . Atorvastatin Other (See Comments)    Muscle Pain  . Gabapentin Swelling  . Ezetimibe Other (See Comments)    Muscle pain    Social History:   Social History   Socioeconomic History  . Marital status: Widowed    Spouse name: Not on file  . Number of children: Not on file  . Years of education: Not on file  . Highest education level: Not on file  Occupational History  . Not on file  Social Needs  . Financial resource strain: Not on file  . Food insecurity:    Worry: Not on file    Inability: Not on file  . Transportation needs:    Medical: Not on file    Non-medical: Not on file  Tobacco Use  . Smoking status: Former Smoker    Packs/day: 0.50    Types: Cigarettes    Last attempt to quit: 07/23/1988    Years since quitting: 29.7  . Smokeless tobacco: Never Used  Substance and Sexual Activity  . Alcohol use: No  . Drug use: No  . Sexual activity: Not Currently    Birth control/protection: None  Lifestyle  . Physical activity:    Days per week: Not on file    Minutes per session: Not on file  . Stress: Not on file  Relationships  . Social connections:    Talks on phone: Not on file    Gets together: Not on file    Attends religious service: Not on file    Active member of club or organization: Not on file    Attends meetings of clubs or organizations: Not on file    Relationship status: Not on file  . Intimate partner violence:    Fear of current or ex partner: Not on file    Emotionally abused: Not on file    Physically abused: Not on file    Forced sexual activity: Not on file  Other Topics Concern  . Not on file  Social History Narrative  . Not on file    Family History:    Family History  Problem Relation Age of Onset    . Colon cancer Mother   . Breast cancer Neg Hx      ROS:  Please see the history of  present illness.  Review of Systems  Constitutional: Positive for diaphoresis and malaise/fatigue. Negative for fever.       Diaphoresis associated with chest pain, now resolved  Respiratory: Positive for shortness of breath. Negative for cough, hemoptysis and sputum production.        SOB associated with the chest pain, now resolved  Cardiovascular: Positive for chest pain. Negative for palpitations, orthopnea and leg swelling.  Gastrointestinal: Positive for nausea. Negative for blood in stool, constipation, diarrhea and vomiting.       Nausea associated with chest pain, now resolved  Neurological: Positive for dizziness and weakness.       Dizziness associated with chest pain, now resolved    All other ROS reviewed and negative.     Physical Exam/Data:   Vitals:   04/10/18 0300 04/10/18 0400 04/10/18 0700 04/10/18 0800  BP: (!) 117/59 109/65  126/68  Pulse: 76 72 65 73  Resp: 13 11 14 13   Temp: 98 F (36.7 C)   98.7 F (37.1 C)  TempSrc: Axillary   Oral  SpO2: 95% 96% 97% 95%  Weight: 67.3 kg     Height: 5\' 2"  (1.575 m)       Intake/Output Summary (Last 24 hours) at 04/10/2018 0929 Last data filed at 04/10/2018 0800 Gross per 24 hour  Intake 130.2 ml  Output -  Net 130.2 ml   Filed Weights   04/09/18 1937 04/10/18 0300  Weight: 67.1 kg 67.3 kg   Body mass index is 27.14 kg/m.  General: Frail and elderly female in no acute distress HEENT: normal Neck: no JVD.  Left side chest port Vascular: No carotid bruits; radial 2+ bilaterally  Cardiac:  normal S1, S2; RRR; no murmur   Lungs:  clear to auscultation bilaterally, no wheezing, rhonchi or rales  Abd: soft, nontender, no hepatomegaly  Ext: no lower extremity edema Musculoskeletal:  No deformities, BUE and BLE strength normal and equal Skin: warm and dry - left sided chest cath port Neuro: no focal abnormalities  noted Psych:  Normal affect   EKG:  The EKG was personally reviewed and demonstrates: As above in HPI Telemetry:  Telemetry was personally reviewed and demonstrates: SR, DWI, heart rate 67 to 90s bpm  Relevant CV Studies:  04/09/2018 TTE Results pending  04/09/2018 LHC and Cor Angio Results pending  Laboratory Data:  Chemistry Recent Labs  Lab 04/09/18 2050 04/10/18 0322  NA 140 140  K 3.4* 3.2*  CL 109 108  CO2 23 25  GLUCOSE 127* 108*  BUN 20 17  CREATININE 1.20* 1.14*  CALCIUM 8.6* 8.6*  GFRNONAA 44* 47*  GFRAA 51* 54*  ANIONGAP 8 7    No results for input(s): PROT, ALBUMIN, AST, ALT, ALKPHOS, BILITOT in the last 168 hours. Hematology Recent Labs  Lab 04/09/18 2050 04/10/18 0322  WBC 10.8* 9.4  RBC 4.02 3.76*  HGB 10.4* 9.6*  HCT 34.3* 32.3*  MCV 85.3 85.9  MCH 25.9* 25.5*  MCHC 30.3 29.7*  RDW 17.7* 17.9*  PLT 291 291   Cardiac Enzymes Recent Labs  Lab 04/09/18 2050 04/10/18 0322 04/10/18 0821  TROPONINI 2.71* 8.32* 15.26*   No results for input(s): TROPIPOC in the last 168 hours.  BNPNo results for input(s): BNP, PROBNP in the last 168 hours.  DDimer No results for input(s): DDIMER in the last 168 hours.  Radiology/Studies:  Dg Chest 2 View  Result Date: 04/09/2018 CLINICAL DATA:  Midsternal chest pain for 2 days EXAM: CHEST - 2  VIEW COMPARISON:  05/01/2008 FINDINGS: Cardiac shadow is stable. Left chest wall port is again noted and stable. Emphysematous changes are seen with paucity of lung markings in the apices. No focal infiltrate or sizable effusion is seen. No bony abnormality is noted. Stable aortic calcifications are seen. IMPRESSION: COPD without acute abnormality. Electronically Signed   By: Inez Catalina M.D.   On: 04/09/2018 20:06   Ct Angio Chest/abd/pel For Dissection W And/or Wo Contrast  Result Date: 04/09/2018 CLINICAL DATA:  Severe midsternal chest pain radiating to the back. EXAM: CT ANGIOGRAPHY CHEST, ABDOMEN AND PELVIS  TECHNIQUE: Multidetector CT imaging through the chest, abdomen and pelvis was performed using the standard protocol during bolus administration of intravenous contrast. Multiplanar reconstructed images and MIPs were obtained and reviewed to evaluate the vascular anatomy. CONTRAST:  39mL ISOVUE-370 IOPAMIDOL (ISOVUE-370) INJECTION 76% COMPARISON:  Chest x-ray from same day. CT abdomen pelvis dated March 26, 2017. CTA chest dated January 22, 2017. FINDINGS: CTA CHEST FINDINGS Cardiovascular: Preferential opacification of the thoracic aorta. No evidence of thoracic aortic aneurysm or dissection. Coronary, aortic arch, and branch vessel atherosclerotic vascular disease. Normal heart size. No pericardial effusion. No pulmonary embolism. Mediastinum/Nodes: No enlarged mediastinal, hilar, or axillary lymph nodes. Thyroid gland, trachea, and esophagus demonstrate no significant findings. Unchanged moderate hiatal hernia. Lungs/Pleura: Severe bullous emphysema again noted. No focal consolidation, pleural effusion, or pneumothorax. New 7 x 5 mm irregular nodule in the superior segment of the left lower lobe (series 6, image 76). Musculoskeletal: Prominent skin thickening and subcutaneous edema of the right breast likely reflects post radiation change. No acute or significant osseous findings. Review of the MIP images confirms the above findings. CTA ABDOMEN AND PELVIS FINDINGS VASCULAR Aorta: Normal caliber aorta without aneurysm, dissection, vasculitis or significant stenosis. Atherosclerosis. Celiac: Patent without evidence of aneurysm, dissection, vasculitis or significant stenosis. SMA: Patent without evidence of aneurysm, dissection, vasculitis or significant stenosis. Renals: Both renal arteries are patent without evidence of aneurysm, dissection, vasculitis, fibromuscular dysplasia or significant stenosis. IMA: Patent without evidence of aneurysm, dissection, vasculitis or significant stenosis. Inflow: Patent without  evidence of aneurysm, dissection, vasculitis or significant stenosis. Veins: No obvious venous abnormality within the limitations of this arterial phase study. Review of the MIP images confirms the above findings. NON-VASCULAR Hepatobiliary: No focal liver abnormality is seen. No gallstones, gallbladder wall thickening, or biliary dilatation. Pancreas: Unremarkable. No pancreatic ductal dilatation or surrounding inflammatory changes. Spleen: Normal in size without focal abnormality. Adrenals/Urinary Tract: The adrenal glands are unremarkable. Unchanged 2.0 cm right renal cyst. Subcentimeter low-density lesion in the left kidney remains too small to characterize. No renal or ureteral calculi. No hydronephrosis. Bladder is unremarkable. Stomach/Bowel: Moderate hiatal hernia. The stomach is otherwise within normal limits. No bowel wall thickening, distention, or surrounding inflammatory changes. Normal appendix. Lymphatic: No enlarged abdominal or pelvic lymph nodes. Reproductive: Status post hysterectomy. No adnexal masses. Other: Prior right inguinal hernia repair. Unchanged small fat containing left inguinal hernia. No free fluid or pneumoperitoneum. Musculoskeletal: No acute or significant osseous findings. 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 Review of the MIP images confirms the above findings. IMPRESSION: Vascular: 1. No evidence of thoracoabdominal aortic aneurysm, dissection, penetrating ulcer, or intramural hematoma. 2.  Aortic atherosclerosis (ICD10-I70.0). Chest: 1.  No acute intrathoracic process. 2. New 6 mm aggregate pulmonary nodule in the left lower lobe. Non-contrast chest CT at 6-12 months is recommended. If the nodule is stable at time of repeat CT, then future CT at 18-24 months (  from today's scan) is considered optional for low-risk patients, but is recommended for high-risk patients. This recommendation follows the consensus statement: Guidelines for Management of  Incidental Pulmonary Nodules Detected on CT Images: From the Fleischner Society 2017; Radiology 2017; 284:228-243. 3.  Emphysema (ICD10-J43.9). 4. Post radiation changes of the right breast. Abdomen and pelvis: 1.  No acute intra-abdominal process. 2. Unchanged moderate hiatal hernia. Electronically Signed   By: Titus Dubin M.D.   On: 04/09/2018 23:03    Assessment and Plan:   NSTEMI - No reported history of previous of cardiac disease. Chest pain resolved from initial rating of 8/10- no current CP.   -Troponin elevation from 2.71  15.26 and consistent with NSTEMI. EKG SR with nonspecific ST/T wave changes. Risk factors include HLD with LDL not at goal, HTN, and previous history of smoking. Also of note, history of R sided radiation.  - Will need AM CBC as on heparin and AM BMET to check renal function and electrolytes. -Plan for left heart cardiac catheterization today 12/18.  The risks and benefits of the procedure were discussed with the patient and she is in agreement to proceed. Baseline creatinine estimated at 1.07-1.13.  04/09/2018 creatinine 1.20.  Patient received CTA at admission, so will start IVF prior to cardiac catheterization to prevent renal injury from contrast.  Patient cannot receive IV to right arm due to history of breast cancer.  Possible access was thus discussed via left chest Port from breast cancer treatment.  Further recommendations following cardiac catheterization and to include plans for medical management. Recommend continue ASA, addition of BB if BP and HR allow, and statin therapy as below and with consideration of reported intolerances (see below). SL nitro as needed for CP. Pending official read of echo and results of LHC.  Hypokalemia - K 3.2. Replete with goal 4.0 following catheterization - Check Mg - Daily BMET to monitor renal function and electrolytes.   Anemia - Baseline Hgb ~11. Presented with Hgb 10.4  9.6 - Daily CBC on heparin -Continue to monitor  with recommendation for transfusion if falls below 8.  HTN  - Hypertensive at presentation - Most recent BP 115/71 and controlled  - Continue PTA losartan-HCTZ 100-25 with potassium supplementation / medical management  HLD - 04/10/2018 LDL 134 - Not at goal. Not on statin. Reported intolerance to atorvastatin and ezetimibe with myalgias reported. Could consider trial rosuvastatin. Recommend start statin if tolerated and patient amenable to statin therapy after obtaining baseline liver panel (already obtained baseline lipid). Will also need f/u labs after start of statin therapy. Titrate statin therapy as needed to achieve LDL goal <70. Consider follow-up in the lipid clinic to control HLD and manage medications.    For questions or updates, please contact [place] Please consult [email or web address] for contact info under     Signed, Arvil Chaco, PA-C  04/10/2018 9:29 AM

## 2018-04-10 NOTE — ED Notes (Signed)
IV team attempting to start 2nd IV and obtain blue top tube for PT/PTT. Will start Heparin once completed.

## 2018-04-10 NOTE — Progress Notes (Signed)
*  PRELIMINARY RESULTS* Echocardiogram 2D Echocardiogram has been performed.  [name] 04/10/2018, 9:14 AM

## 2018-04-10 NOTE — ED Notes (Signed)
Pt placed on bedpan at this by this writer, and removed. Peri care done by pt.

## 2018-04-11 ENCOUNTER — Other Ambulatory Visit: Payer: Self-pay

## 2018-04-11 ENCOUNTER — Encounter: Payer: Self-pay | Admitting: Cardiovascular Disease

## 2018-04-11 LAB — BASIC METABOLIC PANEL
ANION GAP: 10 (ref 5–15)
BUN: 15 mg/dL (ref 8–23)
CO2: 22 mmol/L (ref 22–32)
Calcium: 8.3 mg/dL — ABNORMAL LOW (ref 8.9–10.3)
Chloride: 107 mmol/L (ref 98–111)
Creatinine, Ser: 1.12 mg/dL — ABNORMAL HIGH (ref 0.44–1.00)
GFR calc Af Amer: 55 mL/min — ABNORMAL LOW (ref >60)
GFR, EST NON AFRICAN AMERICAN: 48 mL/min — AB (ref >60)
Glucose, Bld: 114 mg/dL — ABNORMAL HIGH (ref 70–99)
Potassium: 3.9 mmol/L (ref 3.5–5.1)
Sodium: 139 mmol/L (ref 135–145)

## 2018-04-11 LAB — CBC
HCT: 32.5 % — ABNORMAL LOW (ref 36.0–46.0)
Hemoglobin: 9.8 g/dL — ABNORMAL LOW (ref 12.0–15.0)
MCH: 25.5 pg — ABNORMAL LOW (ref 26.0–34.0)
MCHC: 30.2 g/dL (ref 30.0–36.0)
MCV: 84.6 fL (ref 80.0–100.0)
Platelets: 276 10*3/uL (ref 150–400)
RBC: 3.84 MIL/uL — ABNORMAL LOW (ref 3.87–5.11)
RDW: 18.1 % — ABNORMAL HIGH (ref 11.5–15.5)
WBC: 11.1 10*3/uL — ABNORMAL HIGH (ref 4.0–10.5)
nRBC: 0 % (ref 0.0–0.2)

## 2018-04-11 LAB — MAGNESIUM: MAGNESIUM: 1.9 mg/dL (ref 1.7–2.4)

## 2018-04-11 MED ORDER — POTASSIUM CHLORIDE CRYS ER 20 MEQ PO TBCR
20.0000 meq | EXTENDED_RELEASE_TABLET | Freq: Once | ORAL | Status: AC
  Administered 2018-04-11: 20 meq via ORAL
  Filled 2018-04-11: qty 1

## 2018-04-11 MED ORDER — ENALAPRIL MALEATE 2.5 MG PO TABS
2.5000 mg | ORAL_TABLET | Freq: Every day | ORAL | Status: DC
  Administered 2018-04-11: 2.5 mg via ORAL
  Filled 2018-04-11 (×2): qty 1

## 2018-04-11 MED ORDER — SODIUM CHLORIDE 0.9% FLUSH
10.0000 mL | INTRAVENOUS | Status: DC | PRN
  Administered 2018-04-12: 10 mL
  Filled 2018-04-11: qty 40

## 2018-04-11 MED ORDER — SODIUM CHLORIDE 0.9% FLUSH
10.0000 mL | Freq: Two times a day (BID) | INTRAVENOUS | Status: DC
  Administered 2018-04-11 – 2018-04-12 (×2): 10 mL

## 2018-04-11 MED ORDER — FUROSEMIDE 10 MG/ML IJ SOLN
20.0000 mg | Freq: Every day | INTRAMUSCULAR | Status: DC
  Administered 2018-04-11 – 2018-04-12 (×2): 20 mg via INTRAVENOUS
  Filled 2018-04-11 (×2): qty 2

## 2018-04-11 MED ORDER — MAGNESIUM SULFATE IN D5W 1-5 GM/100ML-% IV SOLN
1.0000 g | Freq: Once | INTRAVENOUS | Status: AC
  Administered 2018-04-11: 1 g via INTRAVENOUS
  Filled 2018-04-11: qty 100

## 2018-04-11 NOTE — Progress Notes (Signed)
[facility] NAME: Krista Cisneros    MR#:  [id number]  DATE OF BIRTH:  Jun 16, 1940  SUBJECTIVE:   patient doing well this am no CP overnight  REVIEW OF SYSTEMS:    Review of Systems  Constitutional: Negative for fever, chills weight loss HENT: Negative for ear pain, nosebleeds, congestion, facial swelling, rhinorrhea, neck pain, neck stiffness and ear discharge.   Respiratory: Negative for cough, shortness of breath, wheezing  Cardiovascular: Negative for chest pain, palpitations and leg swelling.  Gastrointestinal: Negative for heartburn, abdominal pain, vomiting, diarrhea or consitpation Genitourinary: Negative for dysuria, urgency, frequency, hematuria Musculoskeletal: Negative for back pain or joint pain Neurological: Negative for dizziness, seizures, syncope, focal weakness,  numbness and headaches.  Hematological: Does not bruise/bleed easily.  Psychiatric/Behavioral: Negative for hallucinations, confusion, dysphoric mood    Tolerating Diet: yes      DRUG ALLERGIES:   Allergies  Allergen Reactions  . Nsaids     Other reaction(s): Other (See Comments) Bleeding risk  . Penicillins Anaphylaxis, Swelling and Rash    Has patient had a PCN reaction causing immediate rash, facial/tongue/throat swelling, SOB or lightheadedness with hypotension: Yes Has patient had a PCN reaction causing severe rash involving mucus membranes or skin necrosis: No Has patient had a PCN reaction that required hospitalization: No  Has patient had a PCN reaction occurring within the last 10 years: No If all of the above answers are "NO", then may proceed with Cephalosporin use.   . Atorvastatin Other (See Comments)    Muscle Pain  . Gabapentin Swelling  . Ezetimibe Other (See Comments)    Muscle pain  . Aspirin     Other reaction(s): Other (See Comments) GI bleeding risk    VITALS:  Blood pressure (!) 114/56, pulse 80, temperature 98.4 F  (36.9 C), temperature source Oral, resp. rate 18, height 5\' 2"  (1.575 m), weight 65.6 kg, SpO2 94 %.  PHYSICAL EXAMINATION:  Constitutional: Appears well-developed and well-nourished. No distress. HENT: Normocephalic. Marland Kitchen Oropharynx is clear and moist.  Eyes: Conjunctivae and EOM are normal. PERRLA, no scleral icterus.  Neck: Normal ROM. Neck supple. No JVD. No tracheal deviation. CVS: RRR, S1/S2 +, no murmurs, no gallops, no carotid bruit.  Pulmonary: Effort and breath sounds normal, no stridor, rhonchi, wheezes, rales.  Abdominal: Soft. BS +,  no distension, tenderness, rebound or guarding.  Musculoskeletal: Normal range of motion. No edema and no tenderness.  Neuro: Alert. CN 2-12 grossly intact. No focal deficits. Skin: Skin is warm and dry. No rash noted. Psychiatric: Normal mood and affect.      LABORATORY PANEL:   CBC Recent Labs  Lab 04/11/18 0424  WBC 11.1*  HGB 9.8*  HCT 32.5*  PLT 276   ------------------------------------------------------------------------------------------------------------------  Chemistries  Recent Labs  Lab 04/11/18 0424  NA 139  K 3.9  CL 107  CO2 22  GLUCOSE 114*  BUN 15  CREATININE 1.12*  CALCIUM 8.3*  MG 1.9   ------------------------------------------------------------------------------------------------------------------  Cardiac Enzymes Recent Labs  Lab 04/10/18 0322 04/10/18 0821 04/10/18 1739  TROPONINI 8.32* 15.26* 18.30*   ------------------------------------------------------------------------------------------------------------------  RADIOLOGY:  Dg Chest 2 View  Result Date: 04/09/2018 CLINICAL DATA:  Midsternal chest pain for 2 days EXAM: CHEST - 2 VIEW COMPARISON:  05/01/2008 FINDINGS: Cardiac shadow is stable. Left chest wall port is again noted and stable. Emphysematous changes are seen with paucity of lung markings in the apices. No focal infiltrate or sizable effusion is seen. No  bony abnormality is  noted. Stable aortic calcifications are seen. IMPRESSION: COPD without acute abnormality. Electronically Signed   By: Inez Catalina M.D.   On: 04/09/2018 20:06   Ct Angio Chest/abd/pel For Dissection W And/or Wo Contrast  Result Date: 04/09/2018 CLINICAL DATA:  Severe midsternal chest pain radiating to the back. EXAM: CT ANGIOGRAPHY CHEST, ABDOMEN AND PELVIS TECHNIQUE: Multidetector CT imaging through the chest, abdomen and pelvis was performed using the standard protocol during bolus administration of intravenous contrast. Multiplanar reconstructed images and MIPs were obtained and reviewed to evaluate the vascular anatomy. CONTRAST:  56mL ISOVUE-370 IOPAMIDOL (ISOVUE-370) INJECTION 76% COMPARISON:  Chest x-ray from same day. CT abdomen pelvis dated March 26, 2017. CTA chest dated January 22, 2017. FINDINGS: CTA CHEST FINDINGS Cardiovascular: Preferential opacification of the thoracic aorta. No evidence of thoracic aortic aneurysm or dissection. Coronary, aortic arch, and branch vessel atherosclerotic vascular disease. Normal heart size. No pericardial effusion. No pulmonary embolism. Mediastinum/Nodes: No enlarged mediastinal, hilar, or axillary lymph nodes. Thyroid gland, trachea, and esophagus demonstrate no significant findings. Unchanged moderate hiatal hernia. Lungs/Pleura: Severe bullous emphysema again noted. No focal consolidation, pleural effusion, or pneumothorax. New 7 x 5 mm irregular nodule in the superior segment of the left lower lobe (series 6, image 76). Musculoskeletal: Prominent skin thickening and subcutaneous edema of the right breast likely reflects post radiation change. No acute or significant osseous findings. Review of the MIP images confirms the above findings. CTA ABDOMEN AND PELVIS FINDINGS VASCULAR Aorta: Normal caliber aorta without aneurysm, dissection, vasculitis or significant stenosis. Atherosclerosis. Celiac: Patent without evidence of aneurysm, dissection, vasculitis or  significant stenosis. SMA: Patent without evidence of aneurysm, dissection, vasculitis or significant stenosis. Renals: Both renal arteries are patent without evidence of aneurysm, dissection, vasculitis, fibromuscular dysplasia or significant stenosis. IMA: Patent without evidence of aneurysm, dissection, vasculitis or significant stenosis. Inflow: Patent without evidence of aneurysm, dissection, vasculitis or significant stenosis. Veins: No obvious venous abnormality within the limitations of this arterial phase study. Review of the MIP images confirms the above findings. NON-VASCULAR Hepatobiliary: No focal liver abnormality is seen. No gallstones, gallbladder wall thickening, or biliary dilatation. Pancreas: Unremarkable. No pancreatic ductal dilatation or surrounding inflammatory changes. Spleen: Normal in size without focal abnormality. Adrenals/Urinary Tract: The adrenal glands are unremarkable. Unchanged 2.0 cm right renal cyst. Subcentimeter low-density lesion in the left kidney remains too small to characterize. No renal or ureteral calculi. No hydronephrosis. Bladder is unremarkable. Stomach/Bowel: Moderate hiatal hernia. The stomach is otherwise within normal limits. No bowel wall thickening, distention, or surrounding inflammatory changes. Normal appendix. Lymphatic: No enlarged abdominal or pelvic lymph nodes. Reproductive: Status post hysterectomy. No adnexal masses. Other: Prior right inguinal hernia repair. Unchanged small fat containing left inguinal hernia. No free fluid or pneumoperitoneum. Musculoskeletal: No acute or significant osseous findings. 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 Review of the MIP images confirms the above findings. IMPRESSION: Vascular: 1. No evidence of thoracoabdominal aortic aneurysm, dissection, penetrating ulcer, or intramural hematoma. 2.  Aortic atherosclerosis (ICD10-I70.0). Chest: 1.  No acute intrathoracic process. 2. New 6 mm aggregate  pulmonary nodule in the left lower lobe. Non-contrast chest CT at 6-12 months is recommended. If the nodule is stable at time of repeat CT, then future CT at 18-24 months (from today's scan) is considered optional for low-risk patients, but is recommended for high-risk patients. This recommendation follows the consensus statement: Guidelines for Management of Incidental Pulmonary Nodules Detected on CT Images: From the Fleischner Society 2017; Radiology  2017; [id number]. 3.  Emphysema (ICD10-J43.9). 4. Post radiation changes of the right breast. Abdomen and pelvis: 1.  No acute intra-abdominal process. 2. Unchanged moderate hiatal hernia. Electronically Signed   By: Titus Dubin M.D.   On: 04/09/2018 23:03     ASSESSMENT AND PLAN:   77 y/o female with HTN and COPD who presented with chest pain to ER.  1. NSTEMI: troponin max 18.30 S/p 2 stents placed LAD  Prox LAD lesion is 95% stenosed.  Mid LAD lesion is 80% stenosed.  Mid Cx to Dist Cx lesion is 95% stenosed.  Ost 3rd Mrg to 3rd Mrg lesion is 40% stenosed.  Ost 3rd Mrg lesion is 60% stenosed.  Mid RCA lesion is 50% stenosed.  The left ventricular ejection fraction is 35-45% by visual estimate.  May need intervention mid cx as an outpatient  Continue aspirin, Coreg, Plavix, Crestor  2.  Acute on chronic systolic heart failure ejection fraction 35%: Discussed with cardiology who will start IV Lasix. Monitor intake and output with daily weight CHF clinic upon discharge  3.  Depression: Continue Cymbalta  4.  History of breast cancer on Femara  5.  COPD without signs of exacerbation  6.  Hyperlipidemia: LDL is 134 goal is less than 70  Discussed with Dr. Rockey Situ   Management plans discussed with the patient and she is in agreement.  CODE STATUS: full  TOTAL TIME TAKING CARE OF THIS PATIENT: 30 minutes.     POSSIBLE D/C tomorrow, DEPENDING ON CLINICAL CONDITION.   Jamonta Goerner M.D on 04/11/2018 at 2:25  PM  Between 7am to 6pm - Pager - [phone] After 6pm go to [email or web address] - password [email or web address] [facility]  Office  [phone]  CC: Primary care physician; Sharyne Peach, MD  Note: This dictation was prepared with Dragon dictation along with smaller phrase technology. Any transcriptional errors that result from this process are unintentional.

## 2018-04-11 NOTE — Progress Notes (Signed)
Progress Note  Patient Name: Krista Cisneros Date of Encounter: 04/11/2018  Primary Cardiologist: new to [facility]  Subjective   Somewhat irritable today, " want to go home" Denies any significant chest pain overnight Reports that she ate breakfast No family at the bedside Blood pressure borderline low   Inpatient Medications    Scheduled Meds: . aspirin EC  81 mg Oral Daily  . baclofen  10 mg Oral QHS  . carvedilol  3.125 mg Oral BID WC  . clopidogrel  75 mg Oral Q breakfast  . DULoxetine  90 mg Oral BH-q7a  . enalapril  2.5 mg Oral Daily  . enoxaparin (LOVENOX) injection  40 mg Subcutaneous Q24H  . furosemide  20 mg Intravenous Daily  . letrozole  2.5 mg Oral Daily  . mometasone-formoterol  2 puff Inhalation BID  . montelukast  10 mg Oral QHS  . pantoprazole  40 mg Oral Daily  . rosuvastatin  20 mg Oral q1800  . sodium chloride flush  3 mL Intravenous Q12H   Continuous Infusions: . sodium chloride    . magnesium sulfate 1 - 4 g bolus IVPB 1 g (04/11/18 1610)   PRN Meds: sodium chloride, acetaminophen **OR** acetaminophen, Melatonin, ondansetron **OR** ondansetron (ZOFRAN) IV, oxyCODONE, sodium chloride flush   Vital Signs    Vitals:   04/11/18 0900 04/11/18 1000 04/11/18 1338 04/11/18 1405  BP: (!) 96/44 92/71 (!) 106/58 (!) 114/56  Pulse: 87 83 82 80  Resp: 18 14 20 18   Temp:   98.5 F (36.9 C)   TempSrc:   Oral   SpO2: 95% 96% 94% 94%  Weight:    65.6 kg  Height:    5\' 2"  (1.575 m)    Intake/Output Summary (Last 24 hours) at 04/11/2018 1623 Last data filed at 04/11/2018 1535 Gross per 24 hour  Intake 240 ml  Output 1805 ml  Net -1565 ml   Filed Weights   04/10/18 0300 04/10/18 1020 04/11/18 1405  Weight: 67.3 kg 67.7 kg 65.6 kg    Telemetry    Normal sinus rhythm- Personally Reviewed  ECG      Physical Exam   Constitutional: Alert, no distress, somewhat short of breath even moving around the bed HENT:  Head: Grossly  normal Eyes:  no discharge. No scleral icterus.  Neck: No JVD, no carotid bruits  Cardiovascular: Regular rate and rhythm, no murmurs appreciated Pulmonary/Chest: Clear with scattered Rales Abdominal: Soft.  no distension.  no tenderness.  Musculoskeletal: Normal range of motion Neurological:  normal muscle tone. Coordination normal. No atrophy Skin: Skin warm and dry Psychiatric: Irritable   Labs    Chemistry Recent Labs  Lab 04/09/18 2050 04/10/18 0322 04/11/18 0424  NA 140 140 139  K 3.4* 3.2* 3.9  CL 109 108 107  CO2 23 25 22   GLUCOSE 127* 108* 114*  BUN 20 17 15   CREATININE 1.20* 1.14* 1.12*  CALCIUM 8.6* 8.6* 8.3*  GFRNONAA 44* 47* 48*  GFRAA 51* 54* 55*  ANIONGAP 8 7 10      Hematology Recent Labs  Lab 04/09/18 2050 04/10/18 0322 04/11/18 0424  WBC 10.8* 9.4 11.1*  RBC 4.02 3.76* 3.84*  HGB 10.4* 9.6* 9.8*  HCT 34.3* 32.3* 32.5*  MCV 85.3 85.9 84.6  MCH 25.9* 25.5* 25.5*  MCHC 30.3 29.7* 30.2  RDW 17.7* 17.9* 18.1*  PLT 291 291 276    Cardiac Enzymes Recent Labs  Lab 04/09/18 2050 04/10/18 0322 04/10/18 0821 04/10/18 1739  TROPONINI  2.71* 8.32* 15.26* 18.30*   No results for input(s): TROPIPOC in the last 168 hours.   BNPNo results for input(s): BNP, PROBNP in the last 168 hours.   DDimer No results for input(s): DDIMER in the last 168 hours.   Radiology    Dg Chest 2 View  Result Date: 04/09/2018 CLINICAL DATA:  Midsternal chest pain for 2 days EXAM: CHEST - 2 VIEW COMPARISON:  05/01/2008 FINDINGS: Cardiac shadow is stable. Left chest wall port is again noted and stable. Emphysematous changes are seen with paucity of lung markings in the apices. No focal infiltrate or sizable effusion is seen. No bony abnormality is noted. Stable aortic calcifications are seen. IMPRESSION: COPD without acute abnormality. Electronically Signed   By: Inez Catalina M.D.   On: 04/09/2018 20:06   Ct Angio Chest/abd/pel For Dissection W And/or Wo  Contrast  Result Date: 04/09/2018 CLINICAL DATA:  Severe midsternal chest pain radiating to the back. EXAM: CT ANGIOGRAPHY CHEST, ABDOMEN AND PELVIS TECHNIQUE: Multidetector CT imaging through the chest, abdomen and pelvis was performed using the standard protocol during bolus administration of intravenous contrast. Multiplanar reconstructed images and MIPs were obtained and reviewed to evaluate the vascular anatomy. CONTRAST:  44mL ISOVUE-370 IOPAMIDOL (ISOVUE-370) INJECTION 76% COMPARISON:  Chest x-ray from same day. CT abdomen pelvis dated March 26, 2017. CTA chest dated January 22, 2017. FINDINGS: CTA CHEST FINDINGS Cardiovascular: Preferential opacification of the thoracic aorta. No evidence of thoracic aortic aneurysm or dissection. Coronary, aortic arch, and branch vessel atherosclerotic vascular disease. Normal heart size. No pericardial effusion. No pulmonary embolism. Mediastinum/Nodes: No enlarged mediastinal, hilar, or axillary lymph nodes. Thyroid gland, trachea, and esophagus demonstrate no significant findings. Unchanged moderate hiatal hernia. Lungs/Pleura: Severe bullous emphysema again noted. No focal consolidation, pleural effusion, or pneumothorax. New 7 x 5 mm irregular nodule in the superior segment of the left lower lobe (series 6, image 76). Musculoskeletal: Prominent skin thickening and subcutaneous edema of the right breast likely reflects post radiation change. No acute or significant osseous findings. Review of the MIP images confirms the above findings. CTA ABDOMEN AND PELVIS FINDINGS VASCULAR Aorta: Normal caliber aorta without aneurysm, dissection, vasculitis or significant stenosis. Atherosclerosis. Celiac: Patent without evidence of aneurysm, dissection, vasculitis or significant stenosis. SMA: Patent without evidence of aneurysm, dissection, vasculitis or significant stenosis. Renals: Both renal arteries are patent without evidence of aneurysm, dissection, vasculitis,  fibromuscular dysplasia or significant stenosis. IMA: Patent without evidence of aneurysm, dissection, vasculitis or significant stenosis. Inflow: Patent without evidence of aneurysm, dissection, vasculitis or significant stenosis. Veins: No obvious venous abnormality within the limitations of this arterial phase study. Review of the MIP images confirms the above findings. NON-VASCULAR Hepatobiliary: No focal liver abnormality is seen. No gallstones, gallbladder wall thickening, or biliary dilatation. Pancreas: Unremarkable. No pancreatic ductal dilatation or surrounding inflammatory changes. Spleen: Normal in size without focal abnormality. Adrenals/Urinary Tract: The adrenal glands are unremarkable. Unchanged 2.0 cm right renal cyst. Subcentimeter low-density lesion in the left kidney remains too small to characterize. No renal or ureteral calculi. No hydronephrosis. Bladder is unremarkable. Stomach/Bowel: Moderate hiatal hernia. The stomach is otherwise within normal limits. No bowel wall thickening, distention, or surrounding inflammatory changes. Normal appendix. Lymphatic: No enlarged abdominal or pelvic lymph nodes. Reproductive: Status post hysterectomy. No adnexal masses. Other: Prior right inguinal hernia repair. Unchanged small fat containing left inguinal hernia. No free fluid or pneumoperitoneum. Musculoskeletal: No acute or significant osseous findings. 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 Review of the  MIP images confirms the above findings. IMPRESSION: Vascular: 1. No evidence of thoracoabdominal aortic aneurysm, dissection, penetrating ulcer, or intramural hematoma. 2.  Aortic atherosclerosis (ICD10-I70.0). Chest: 1.  No acute intrathoracic process. 2. New 6 mm aggregate pulmonary nodule in the left lower lobe. Non-contrast chest CT at 6-12 months is recommended. If the nodule is stable at time of repeat CT, then future CT at 18-24 months (from today's scan) is considered  optional for low-risk patients, but is recommended for high-risk patients. This recommendation follows the consensus statement: Guidelines for Management of Incidental Pulmonary Nodules Detected on CT Images: From the Fleischner Society 2017; Radiology 2017; 284:228-243. 3.  Emphysema (ICD10-J43.9). 4. Post radiation changes of the right breast. Abdomen and pelvis: 1.  No acute intra-abdominal process. 2. Unchanged moderate hiatal hernia. Electronically Signed   By: Titus Dubin M.D.   On: 04/09/2018 23:03    Cardiac Studies   Echocardiogram - Left ventricle: The cavity size was normal. Systolic function was   moderately to severely reduced. The estimated ejection fraction   was in the range of 30% to 35%. Hypokinesis of the anteroseptal   myocardium. Hypokinesis of the anterior myocardium. Hypokinesis   of the apical myocardium. Hypokinesis of the anterolateral   myocardium. Doppler parameters are consistent with abnormal left   ventricular relaxation (grade 1 diastolic dysfunction). - Mitral valve: There was mild to moderate regurgitation. - Left atrium: The atrium was mildly dilated. - Right ventricle: Systolic function was normal.   Patient Profile     77 year old woman with past medical history of smoking, COPD, breast cancer on the right history of radiation, hyperlipidemia presented to the hospital with unstable angina symptoms  Assessment & Plan    A/P: Non-STEMI Unstable angina symptoms, abnormal EKG with troponin up to 15 -Catheterization yesterday stent x2 placed to her LAD Residual severe left circumflex disease Discussed with Dr. Fletcher Anon today concerning timing of intervention on left circumflex,  He has recommended several weeks time given anterior wall hypokinesis --On aspirin Plavix, carvedilol  Cardiomyopathy Ejection fraction 35%, anterior wall hypokinesis wrapping around the apex Would hope this will improve somewhat with stent x2 to the LAD Continue  carvedilol, will start enalapril 2.5 mg daily, Lasix 20 mg IV daily starting today High risk of systolic CHF: -Low-dose spironolactone if blood pressure will permit  Anemia We will need to monitor, consider iron studies  Hypertension Losartan HCTZ on hold Started on low-dose ACE inhibitor, carvedilol If blood pressure allows would start on spironolactone  Hyperlipidemia Continue Crestor  Case discussed with Dr. Benjie Karvonen, given underlying dementia, shortness of breath, risk of systolic CHF, continued need for medication titration for her cardiomyopathy and recovery from non-STEMI, would continue hospitalization today and consider discharge tomorrow if stable  Total encounter time more than 25 minutes  Greater than 50% was spent in counseling and coordination of care with the patient   For questions or updates, please contact [facility] Please consult [email or web address] for contact info under        Signed, Ida Rogue, MD  04/11/2018, 4:23 PM

## 2018-04-11 NOTE — Progress Notes (Signed)
Pharmacy Electrolyte Monitoring Consult:  Pharmacy consulted to assist in monitoring and replacing electrolytes in this 77 y.o. female admitted on 04/09/2018 with NSTEMI.   Labs:  Sodium (mmol/L)  Date Value  04/11/2018 139  04/08/2014 141   Potassium (mmol/L)  Date Value  04/11/2018 3.9  04/08/2014 3.9   Magnesium (mg/dL)  Date Value  04/11/2018 1.9   Phosphorus (mg/dL)  Date Value  04/10/2018 3.3   Calcium (mg/dL)  Date Value  04/11/2018 8.3 (L)   Calcium, Total (mg/dL)  Date Value  04/08/2014 8.8   Albumin (g/dL)  Date Value  03/12/2018 3.5    Assessment/Plan: Will order potassium 31mEq PO x 1 and magnesium 1g IV x 1.   Goal Potassium ~ 4 and goal magnesium ~ 2.   Will obtain BMP/Magnesium with am labs.   Pharmacy will continue to monitor and adjust per consult.    Nickola Lenig L 04/11/2018 2:10 PM

## 2018-04-12 ENCOUNTER — Telehealth: Payer: Self-pay | Admitting: Physician Assistant

## 2018-04-12 DIAGNOSIS — I420 Dilated cardiomyopathy: Secondary | ICD-10-CM

## 2018-04-12 LAB — BASIC METABOLIC PANEL
Anion gap: 5 (ref 5–15)
BUN: 18 mg/dL (ref 8–23)
CO2: 22 mmol/L (ref 22–32)
Calcium: 6.9 mg/dL — ABNORMAL LOW (ref 8.9–10.3)
Chloride: 111 mmol/L (ref 98–111)
Creatinine, Ser: 0.98 mg/dL (ref 0.44–1.00)
GFR calc non Af Amer: 56 mL/min — ABNORMAL LOW (ref >60)
Glucose, Bld: 100 mg/dL — ABNORMAL HIGH (ref 70–99)
POTASSIUM: 2.8 mmol/L — AB (ref 3.5–5.1)
Sodium: 138 mmol/L (ref 135–145)

## 2018-04-12 LAB — MAGNESIUM: Magnesium: 2 mg/dL (ref 1.7–2.4)

## 2018-04-12 MED ORDER — ENALAPRIL MALEATE 2.5 MG PO TABS: 2.5000 mg | ORAL_TABLET | Freq: Every day | ORAL | 0 refills | Status: DC

## 2018-04-12 MED ORDER — POTASSIUM CHLORIDE ER 10 MEQ PO TBCR: 10.0000 meq | EXTENDED_RELEASE_TABLET | Freq: Every day | ORAL | 0 refills | Status: DC

## 2018-04-12 MED ORDER — POTASSIUM CHLORIDE CRYS ER 20 MEQ PO TBCR
40.0000 meq | EXTENDED_RELEASE_TABLET | Freq: Two times a day (BID) | ORAL | Status: DC
  Administered 2018-04-12: 40 meq via ORAL
  Filled 2018-04-12: qty 2

## 2018-04-12 MED ORDER — FUROSEMIDE 20 MG PO TABS: 20.0000 mg | ORAL_TABLET | Freq: Every day | ORAL | 11 refills | Status: DC

## 2018-04-12 MED ORDER — ROSUVASTATIN CALCIUM 20 MG PO TABS: 20.0000 mg | ORAL_TABLET | Freq: Every day | ORAL | 0 refills | Status: DC

## 2018-04-12 MED ORDER — CARVEDILOL 3.125 MG PO TABS: 3.1250 mg | ORAL_TABLET | Freq: Two times a day (BID) | ORAL | 0 refills | Status: DC

## 2018-04-12 MED ORDER — CLOPIDOGREL BISULFATE 75 MG PO TABS: 75.0000 mg | ORAL_TABLET | Freq: Every day | ORAL | 0 refills | Status: DC

## 2018-04-12 MED ORDER — MAGNESIUM OXIDE 400 (241.3 MG) MG PO TABS
400.0000 mg | ORAL_TABLET | Freq: Every day | ORAL | Status: DC
  Administered 2018-04-12: 400 mg via ORAL
  Filled 2018-04-12: qty 1

## 2018-04-12 MED ORDER — HEPARIN SOD (PORK) LOCK FLUSH 100 UNIT/ML IV SOLN
500.0000 [IU] | INTRAVENOUS | Status: AC | PRN
  Administered 2018-04-12: 500 [IU]

## 2018-04-12 NOTE — Telephone Encounter (Signed)
Patient contacted regarding discharge from [facility] on 04/12/18.   Patient understands to follow up with provider ? On 04/22/18 at 8:30am at [facility].  Patient understands discharge instructions? Yes  Patient understands medications and regiment? Yes  Patient understands to bring all medications to this visit? Yes

## 2018-04-12 NOTE — Progress Notes (Signed)
IV and tele removed from patient. Discharge instructions given to patient via SWOT RN. Verbalized understanding.No acute distress. Daughter to transport patient home.

## 2018-04-12 NOTE — Progress Notes (Signed)
Progress Note  Patient Name: Krista Cisneros Date of Encounter: 04/12/2018  Primary Cardiologist: new to [facility] - consult by [place]  Subjective   No chest pain or SOB. 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 Did not fall or hit her head. No LOC. Renal function improved. Potassium low at 2.8 this morning. BP in the low 960A systolic.   Inpatient Medications    Scheduled Meds: . aspirin EC  81 mg Oral Daily  . baclofen  10 mg Oral QHS  . carvedilol  3.125 mg Oral BID WC  . clopidogrel  75 mg Oral Q breakfast  . DULoxetine  90 mg Oral BH-q7a  . enalapril  2.5 mg Oral Daily  . enoxaparin (LOVENOX) injection  40 mg Subcutaneous Q24H  . furosemide  20 mg Intravenous Daily  . letrozole  2.5 mg Oral Daily  . mometasone-formoterol  2 puff Inhalation BID  . montelukast  10 mg Oral QHS  . pantoprazole  40 mg Oral Daily  . potassium chloride  40 mEq Oral BID  . rosuvastatin  20 mg Oral q1800  . sodium chloride flush  10-40 mL Intracatheter Q12H  . sodium chloride flush  3 mL Intravenous Q12H   Continuous Infusions: . sodium chloride     PRN Meds: sodium chloride, acetaminophen **OR** acetaminophen, Melatonin, ondansetron **OR** ondansetron (ZOFRAN) IV, oxyCODONE, sodium chloride flush, sodium chloride flush   Vital Signs    Vitals:   04/11/18 1713 04/11/18 2009 04/11/18 2353 04/12/18 0405  BP: 114/60 (!) 114/55 (!) 103/51 (!) 106/50  Pulse: 81 76 66 68  Resp: 18 18    Temp:  98.4 F (36.9 C) 97.8 F (36.6 C) 98.7 F (37.1 C)  TempSrc:  Oral Oral Oral  SpO2: 97% 97% 92% 95%  Weight:      Height:        Intake/Output Summary (Last 24 hours) at 04/12/2018 0710 Last data filed at 04/12/2018 0405 Gross per 24 hour  Intake 240 ml  Output 1150 ml  Net -910 ml   Filed Weights   04/10/18 0300 04/10/18 1020 04/11/18 1405  Weight: 67.3 kg 67.7 kg 65.6 kg    Telemetry    NSR - Personally Reviewed  ECG    NSR with  sinus arrhythmia, 76 bpm, septal infarct, anterolateral TWI - Personally Reviewed  Physical Exam   GEN: No acute distress.   Neck: No JVD. Cardiac: RRR, no murmurs, rubs, or gallops.  Respiratory: Clear to auscultation bilaterally.  GI: Soft, nontender, non-distended.   MS: No edema; No deformity. Neuro:  Alert and oriented x 3; Nonfocal.  Psych: Normal affect.  Labs    Chemistry Recent Labs  Lab 04/10/18 0322 04/11/18 0424 04/12/18 0452  NA 140 139 138  K 3.2* 3.9 2.8*  CL 108 107 111  CO2 25 22 22   GLUCOSE 108* 114* 100*  BUN 17 15 18   CREATININE 1.14* 1.12* 0.98  CALCIUM 8.6* 8.3* 6.9*  GFRNONAA 47* 48* 56*  GFRAA 54* 55* >60  ANIONGAP 7 10 5      Hematology Recent Labs  Lab 04/09/18 2050 04/10/18 0322 04/11/18 0424  WBC 10.8* 9.4 11.1*  RBC 4.02 3.76* 3.84*  HGB 10.4* 9.6* 9.8*  HCT 34.3* 32.3* 32.5*  MCV 85.3 85.9 84.6  MCH 25.9* 25.5* 25.5*  MCHC 30.3 29.7* 30.2  RDW 17.7* 17.9* 18.1*  PLT 291 291 276    Cardiac Enzymes Recent Labs  Lab 04/09/18 2050 04/10/18 0322 04/10/18 0821 04/10/18 1739  TROPONINI 2.71* 8.32* 15.26* 18.30*   No results for input(s): TROPIPOC in the last 168 hours.   BNPNo results for input(s): BNP, PROBNP in the last 168 hours.   DDimer No results for input(s): DDIMER in the last 168 hours.   Radiology    No results found.  Cardiac Studies   Echo 04/10/2018: Study Conclusions  - Left ventricle: The cavity size was normal. Systolic function was   moderately to severely reduced. The estimated ejection fraction   was in the range of 30% to 35%. Hypokinesis of the anteroseptal   myocardium. Hypokinesis of the anterior myocardium. Hypokinesis   of the apical myocardium. Hypokinesis of the anterolateral   myocardium. Doppler parameters are consistent with abnormal left   ventricular relaxation (grade 1 diastolic dysfunction). - Mitral valve: There was mild to moderate regurgitation. - Left atrium: The atrium was  mildly dilated. - Right ventricle: Systolic function was normal. __________  LHC 04/10/2018: Conclusion    Prox LAD lesion is 95% stenosed.  Mid LAD lesion is 80% stenosed.  Mid Cx to Dist Cx lesion is 95% stenosed.  Ost 3rd Mrg to 3rd Mrg lesion is 40% stenosed.  Ost 3rd Mrg lesion is 60% stenosed.  Mid RCA lesion is 50% stenosed.  The left ventricular ejection fraction is 35-45% by visual estimate.  PCI    Prox LAD to Mid LAD lesion is 95% stenosed.  Post intervention, there is a 0% residual stenosis.  A drug-eluting stent was successfully placed using a STENT RESOLUTE ONYX 2.5X18.  Mid LAD lesion is 70% stenosed.  Post intervention, there is a 0% residual stenosis.  A drug-eluting stent was successfully placed using a STENT RESOLUTE ONYX 2.0X12.  Mid Cx lesion is 90% stenosed.  Ost 2nd Mrg to 2nd Mrg lesion is 40% stenosed.   Successful angioplasty and drug-eluting stent placement to the LAD x2.  Recommendations: Dual antiplatelet therapy for at least 12 months. We will try rosuvastatin for hyperlipidemia given intolerance to atorvastatin. Recommend staged left circumflex PCI in few weeks. I Started small dose carvedilol.  continue losartan given cardiomyopathy.  __________  Patient Profile     77 y.o. female with history of COPD secondary to tobacco abuse, right-sided breast cancer s/p radiation, CKD stage II, anemia, HTN, and HLD admitted with a NSTEMI.   Assessment & Plan    1. NSTEMI: -Troponin peaked at 18.30 on 04/10/18 -Currently, without chest pain -Continue DAPT with ASA 81 mg and Plavix 75 mg daily without interruption for at least the next 12 months -Planning for staged PCI of the LCx in a few weeks, this will be scheduled when she is seen in hospital follow up -Cardiac rehab following staged PCI of the LCx -Aggressive secondary prevention  -Feels quite weak, has not ambulated in the hallway this admission. Needs to ambulate with staff  this morning to assess functional status. May need rehab given weakness noted overnight with ambulation to the restroom  2. Acute systolic CHF: -Echo this admission with an EF of 35% with anterior HK -Hopefully her cardiomyopathy with improve s/p PCI to the LAD this admission followed by staged PCI of the LCx in a few weeks -Continue Coreg an enalapril -If BP will allow, start spironolactone. This may have to be revisited in outpatient follow up -BP precludes transition to Entresto at this time -Will discuss possible LifeVest with MD -CHF education -She will need a follow up echo after her  medications have been optimized and her LCx has been intervened upon. Timing of this repeat echo will be ~ 3 months  3. Anemia: -CBC stable on 12/19 -Outpatient follow up  4. CKD stage II: -Improved -Outpatient follow up  5. HTN: -Blood pressure on the soft side -Coreg and enalapril   6. HLD: -LDL of 134 this admission -Goal LDL < 70 -Crestor -Will need follow up lipid and liver function as an outpatient in ~ 8 weeks  7. Hypokalemia: -Replete to goal of 4.0    For questions or updates, please contact [place] Please consult [email or web address] for contact info under Cardiology/STEMI.    Signed, Christell Faith, PA-C [place] Pager: [phone] 04/12/2018, 7:10 AM

## 2018-04-12 NOTE — Telephone Encounter (Signed)
-----   Message from Rise Mu, PA-C sent at 04/12/2018  7:41 AM EST ----- Patient will need TCM follow up in 7 days.

## 2018-04-12 NOTE — Telephone Encounter (Signed)
TCM....  Patient is being discharged   They saw Rockey Situ   They are scheduled to see Thurmond Butts on 12/30 at 21 am   They were seen for NSTEMI  S/p cath   They need to be seen within 7 days      Please call

## 2018-04-12 NOTE — Care Management Important Message (Signed)
Copy of signed IM left with patient in room.  

## 2018-04-12 NOTE — Care Management Note (Signed)
Case Management Note  Patient Details  Name: Krista Cisneros MRN: [id number] Date of Birth: 09-25-1940  Subjective/Objective:  Independent in all adls, denies issues accessing medical care, obtaining medications or with transportation.  Current with PCP.  No discharge needs identified at present by care manager or members of care team                    Action/Plan:   Expected Discharge Date:  04/12/18               Expected Discharge Plan:  Home/Self Care  In-House Referral:     Discharge planning Services  CM Consult  Post Acute Care Choice:    Choice offered to:     DME Arranged:    DME Agency:     HH Arranged:    [place] Agency:     Status of Service:  Completed, signed off  If discussed at [employer] of Stay Meetings, dates discussed:    Additional Comments:  Elza Rafter, RN 04/12/2018, 10:22 AM

## 2018-04-12 NOTE — Discharge Summary (Signed)
[place] [facility] [place] NAME: Krista Cisneros    MR#:  [id number]  DATE OF BIRTH:  10/13/1940  DATE OF ADMISSION:  04/09/2018 ADMITTING PHYSICIAN: Lance Coon, MD  DATE OF DISCHARGE: 04/12/2018  PRIMARY CARE PHYSICIAN: Sharyne Peach, MD    ADMISSION DIAGNOSIS:  NSTEMI (non-ST elevated myocardial infarction) ([place]) [I21.4]  DISCHARGE DIAGNOSIS:  Principal Problem:   NSTEMI (non-ST elevated myocardial infarction) [facility]) Active Problems:   Essential hypertension   COPD (chronic obstructive pulmonary disease) (HCC)   GERD (gastroesophageal reflux disease)   Malignant neoplasm of right female breast ([place])   Hyperlipidemia, mixed   SECONDARY DIAGNOSIS:   Past Medical History:  Diagnosis Date  . Anxiety   . Arthritis   . Cancer [facility])    Right Breast Cancer  . COPD (chronic obstructive pulmonary disease) ([place])   . Depression   . Dyspnea    with exertion  . GERD (gastroesophageal reflux disease)   . Hyperlipidemia   . Hypertension   . Personal history of chemotherapy   . Personal history of radiation therapy     HOSPITAL COURSE:  77 y/o female with HTN and COPD who presented with chest pain to ER.  1. NSTEMI: troponin max 18.30 S/p 2 stents placed LAD  Prox LAD lesion is 95% stenosed.  Mid LAD lesion is 80% stenosed.  Mid Cx to Dist Cx lesion is 95% stenosed.  Ost 3rd Mrg to 3rd Mrg lesion is 40% stenosed.  Ost 3rd Mrg lesion is 60% stenosed.  Mid RCA lesion is 50% stenosed.  The left ventricular ejection fraction is 35-45% by visual estimate.  Planning for staged PCI of the LCx in a few weeks, this will be scheduled when she is seen in hospital follow up -Cardiac rehab following staged PCI of the LCx  Continue aspirin, Coreg, Plavix, Crestor, ACEI for low EF.   2.  Acute on chronic systolic heart failure ejection fraction 35%: Discussed with cardiology who will start IV Lasix. Monitor intake and output with  daily weight CHF clinic upon discharge  3.  Depression: Continue Cymbalta  4.  History of breast cancer on Femara  5.  COPD without signs of exacerbation  6.  Hyperlipidemia: LDL is 134 goal is less than 70    DISCHARGE CONDITIONS AND DIET:   Stable Cardiac diet  CONSULTS OBTAINED:  Treatment Team:  Loletha Grayer, MD Minna Merritts, MD  DRUG ALLERGIES:   Allergies  Allergen Reactions  . Nsaids     Other reaction(s): Other (See Comments) Bleeding risk  . Penicillins Anaphylaxis, Swelling and Rash    Has patient had a PCN reaction causing immediate rash, facial/tongue/throat swelling, SOB or lightheadedness with hypotension: Yes Has patient had a PCN reaction causing severe rash involving mucus membranes or skin necrosis: No Has patient had a PCN reaction that required hospitalization: No  Has patient had a PCN reaction occurring within the last 10 years: No If all of the above answers are "NO", then may proceed with Cephalosporin use.   . Atorvastatin Other (See Comments)    Muscle Pain  . Gabapentin Swelling  . Ezetimibe Other (See Comments)    Muscle pain  . Aspirin     Other reaction(s): Other (See Comments) GI bleeding risk    DISCHARGE MEDICATIONS:   Allergies as of 04/12/2018      Reactions   Nsaids    Other reaction(s): Other (See Comments) Bleeding risk   Penicillins Anaphylaxis, Swelling,  Rash   Has patient had a PCN reaction causing immediate rash, facial/tongue/throat swelling, SOB or lightheadedness with hypotension: Yes Has patient had a PCN reaction causing severe rash involving mucus membranes or skin necrosis: No Has patient had a PCN reaction that required hospitalization: No  Has patient had a PCN reaction occurring within the last 10 years: No If all of the above answers are "NO", then may proceed with Cephalosporin use.   Atorvastatin Other (See Comments)   Muscle Pain   Gabapentin Swelling   Ezetimibe Other (See Comments)    Muscle pain   Aspirin    Other reaction(s): Other (See Comments) GI bleeding risk      Medication List    STOP taking these medications   KLOR-CON M20 20 MEQ tablet Generic drug:  potassium chloride SA   losartan-hydrochlorothiazide 100-25 MG tablet Commonly known as:  HYZAAR   potassium chloride SA 20 MEQ tablet Commonly known as:  K-DUR,KLOR-CON     TAKE these medications   ADVAIR DISKUS 500-50 MCG/DOSE Aepb Generic drug:  Fluticasone-Salmeterol Inhale 1 puff into the lungs 2 (two) times daily.   albuterol 108 (90 Base) MCG/ACT inhaler Commonly known as:  PROVENTIL HFA;VENTOLIN HFA Inhale 2 puffs into the lungs every 6 (six) hours as needed for wheezing or shortness of breath.   aspirin EC 81 MG tablet Take 81 mg by mouth daily.   baclofen 10 MG tablet Commonly known as:  LIORESAL Take 10 mg by mouth at bedtime.   BAYER BACK & BODY PAIN EX ST PO Take 2 tablets by mouth 2 (two) times daily as needed (for back pain).   carvedilol 3.125 MG tablet Commonly known as:  COREG Take 1 tablet (3.125 mg total) by mouth 2 (two) times daily with a meal.   clopidogrel 75 MG tablet Commonly known as:  PLAVIX Take 1 tablet (75 mg total) by mouth daily with breakfast. Start taking on:  April 13, 2018   co-enzyme Q-10 30 MG capsule Take 30 mg by mouth daily.   DULoxetine 60 MG capsule Commonly known as:  CYMBALTA Take 60 mg by mouth every morning. Take with 30 mg to total 90 mg once daily   DULoxetine 30 MG capsule Commonly known as:  CYMBALTA Take 30 mg by mouth every morning. Take with 60 mg to total 90 mg once daily   enalapril 2.5 MG tablet Commonly known as:  VASOTEC Take 1 tablet (2.5 mg total) by mouth daily.   Fish Oil 1000 MG Caps Take 1,000 mg by mouth daily.   furosemide 20 MG tablet Commonly known as:  LASIX Take 1 tablet (20 mg total) by mouth daily.   letrozole 2.5 MG tablet Commonly known as:  FEMARA Take 1 tablet (2.5 mg total) by mouth  daily.   lidocaine-prilocaine cream Commonly known as:  EMLA Apply 1 application as needed topically (for port access).   magnesium oxide 400 MG tablet Commonly known as:  MAG-OX Take 400 mg by mouth daily.   Melatonin 5 MG Tabs Take 10 mg by mouth at bedtime as needed (sleep).   montelukast 10 MG tablet Commonly known as:  SINGULAIR Take 10 mg by mouth at bedtime.   omeprazole 20 MG capsule Commonly known as:  PRILOSEC Take 20 mg by mouth every morning.   potassium chloride 10 MEQ tablet Commonly known as:  K-DUR Take 1 tablet (10 mEq total) by mouth daily.   rosuvastatin 20 MG tablet Commonly known as:  CRESTOR Take 1  tablet (20 mg total) by mouth daily at 6 PM.         Today   CHIEF COMPLAINT:  Doing well this am no CP   VITAL SIGNS:  Blood pressure 108/60, pulse 77, temperature 98.1 F (36.7 C), resp. rate 18, height 5\' 2"  (1.575 m), weight 65.6 kg, SpO2 95 %.   REVIEW OF SYSTEMS:  Review of Systems  Constitutional: Negative.  Negative for chills, fever and malaise/fatigue.  HENT: Negative.  Negative for ear discharge, ear pain, hearing loss, nosebleeds and sore throat.   Eyes: Negative.  Negative for blurred vision and pain.  Respiratory: Negative.  Negative for cough, hemoptysis, shortness of breath and wheezing.   Cardiovascular: Negative.  Negative for chest pain, palpitations and leg swelling.  Gastrointestinal: Negative.  Negative for abdominal pain, blood in stool, diarrhea, nausea and vomiting.  Genitourinary: Negative.  Negative for dysuria.  Musculoskeletal: Negative.  Negative for back pain.  Skin: Negative.   Neurological: Negative for dizziness, tremors, speech change, focal weakness, seizures and headaches.  Endo/Heme/Allergies: Negative.  Does not bruise/bleed easily.  Psychiatric/Behavioral: Negative.  Negative for depression, hallucinations and suicidal ideas.     PHYSICAL EXAMINATION:  GENERAL:  77 y.o.-year-old patient lying in  the bed with no acute distress.  NECK:  Supple, no jugular venous distention. No thyroid enlargement, no tenderness.  LUNGS: Normal breath sounds bilaterally, no wheezing, rales,rhonchi  No use of accessory muscles of respiration.  CARDIOVASCULAR: S1, S2 normal. No murmurs, rubs, or gallops.  ABDOMEN: Soft, non-tender, non-distended. Bowel sounds present. No organomegaly or mass.  EXTREMITIES: No pedal edema, cyanosis, or clubbing.  PSYCHIATRIC: The patient is alert and oriented x 3.  SKIN: No obvious rash, lesion, or ulcer.   DATA REVIEW:   CBC Recent Labs  Lab 04/11/18 0424  WBC 11.1*  HGB 9.8*  HCT 32.5*  PLT 276    Chemistries  Recent Labs  Lab 04/12/18 0452  NA 138  K 2.8*  CL 111  CO2 22  GLUCOSE 100*  BUN 18  CREATININE 0.98  CALCIUM 6.9*  MG 2.0    Cardiac Enzymes Recent Labs  Lab 04/10/18 0322 04/10/18 0821 04/10/18 1739  TROPONINI 8.32* 15.26* 18.30*    Microbiology Results  @[id number] @  RADIOLOGY:  No results found.    Allergies as of 04/12/2018      Reactions   Nsaids    Other reaction(s): Other (See Comments) Bleeding risk   Penicillins Anaphylaxis, Swelling, Rash   Has patient had a PCN reaction causing immediate rash, facial/tongue/throat swelling, SOB or lightheadedness with hypotension: Yes Has patient had a PCN reaction causing severe rash involving mucus membranes or skin necrosis: No Has patient had a PCN reaction that required hospitalization: No  Has patient had a PCN reaction occurring within the last 10 years: No If all of the above answers are "NO", then may proceed with Cephalosporin use.   Atorvastatin Other (See Comments)   Muscle Pain   Gabapentin Swelling   Ezetimibe Other (See Comments)   Muscle pain   Aspirin    Other reaction(s): Other (See Comments) GI bleeding risk      Medication List    STOP taking these medications   KLOR-CON M20 20 MEQ tablet Generic drug:  potassium chloride SA    losartan-hydrochlorothiazide 100-25 MG tablet Commonly known as:  HYZAAR   potassium chloride SA 20 MEQ tablet Commonly known as:  K-DUR,KLOR-CON     TAKE these medications   ADVAIR DISKUS 500-50 MCG/DOSE  Aepb Generic drug:  Fluticasone-Salmeterol Inhale 1 puff into the lungs 2 (two) times daily.   albuterol 108 (90 Base) MCG/ACT inhaler Commonly known as:  PROVENTIL HFA;VENTOLIN HFA Inhale 2 puffs into the lungs every 6 (six) hours as needed for wheezing or shortness of breath.   aspirin EC 81 MG tablet Take 81 mg by mouth daily.   baclofen 10 MG tablet Commonly known as:  LIORESAL Take 10 mg by mouth at bedtime.   BAYER BACK & BODY PAIN EX ST PO Take 2 tablets by mouth 2 (two) times daily as needed (for back pain).   carvedilol 3.125 MG tablet Commonly known as:  COREG Take 1 tablet (3.125 mg total) by mouth 2 (two) times daily with a meal.   clopidogrel 75 MG tablet Commonly known as:  PLAVIX Take 1 tablet (75 mg total) by mouth daily with breakfast. Start taking on:  April 13, 2018   co-enzyme Q-10 30 MG capsule Take 30 mg by mouth daily.   DULoxetine 60 MG capsule Commonly known as:  CYMBALTA Take 60 mg by mouth every morning. Take with 30 mg to total 90 mg once daily   DULoxetine 30 MG capsule Commonly known as:  CYMBALTA Take 30 mg by mouth every morning. Take with 60 mg to total 90 mg once daily   enalapril 2.5 MG tablet Commonly known as:  VASOTEC Take 1 tablet (2.5 mg total) by mouth daily.   Fish Oil 1000 MG Caps Take 1,000 mg by mouth daily.   furosemide 20 MG tablet Commonly known as:  LASIX Take 1 tablet (20 mg total) by mouth daily.   letrozole 2.5 MG tablet Commonly known as:  FEMARA Take 1 tablet (2.5 mg total) by mouth daily.   lidocaine-prilocaine cream Commonly known as:  EMLA Apply 1 application as needed topically (for port access).   magnesium oxide 400 MG tablet Commonly known as:  MAG-OX Take 400 mg by mouth daily.    Melatonin 5 MG Tabs Take 10 mg by mouth at bedtime as needed (sleep).   montelukast 10 MG tablet Commonly known as:  SINGULAIR Take 10 mg by mouth at bedtime.   omeprazole 20 MG capsule Commonly known as:  PRILOSEC Take 20 mg by mouth every morning.   potassium chloride 10 MEQ tablet Commonly known as:  K-DUR Take 1 tablet (10 mEq total) by mouth daily.   rosuvastatin 20 MG tablet Commonly known as:  CRESTOR Take 1 tablet (20 mg total) by mouth daily at 6 PM.          Management plans discussed with the patient and she is in agreement. Stable for discharge   Patient should follow up with cardiology  CODE STATUS:     Code Status Orders  (From admission, onward)         Start     Ordered   04/10/18 0243  Full code  Continuous     04/10/18 0242        Code Status History    Date Active Date Inactive Code Status Order ID Comments User Context   03/27/2017 0121 03/28/2017 1812 Full Code [id number]  Jules Husbands, MD Inpatient   02/07/2017 1703 02/10/2017 1613 Full Code [id number]  Bettey Costa, MD Inpatient      TOTAL TIME TAKING CARE OF THIS PATIENT: 38 minutes.    Note: This dictation was prepared with Dragon dictation along with smaller phrase technology. Any transcriptional errors that result from this process are unintentional.  Lashawn Orrego M.D on 04/12/2018 at 10:03 AM  Between 7am to 6pm - Pager - [phone] After 6pm go to [email or web address] - password [email or web address] [facility]  Office  [phone]  CC: Primary care physician; Sharyne Peach, MD

## 2018-04-12 NOTE — Progress Notes (Addendum)
Pharmacy Electrolyte Monitoring Consult:  Pharmacy consulted to assist in monitoring and replacing electrolytes in this 77 y.o. female admitted on 04/09/2018 with NSTEMI.   Labs:  Sodium (mmol/L)  Date Value  04/12/2018 138  04/08/2014 141   Potassium (mmol/L)  Date Value  04/12/2018 2.8 (L)  04/08/2014 3.9   Magnesium (mg/dL)  Date Value  04/12/2018 2.0   Phosphorus (mg/dL)  Date Value  04/10/2018 3.3   Calcium (mg/dL)  Date Value  04/12/2018 6.9 (L)   Calcium, Total (mg/dL)  Date Value  04/08/2014 8.8   Albumin (g/dL)  Date Value  03/12/2018 3.5    Assessment/Plan: Goal Potassium ~ 4 and goal magnesium ~ 2.   KCl 40 mEq PO x 2 ordered this AM. Also ordered patient's PTA medication of Mg oxide 400 mg PO daily.  Will obtain BMP/Magnesium with am labs.   Pharmacy will continue to monitor and adjust per consult.     Paticia Stack, PharmD Pharmacy Resident  04/12/2018 7:28 AM

## 2018-04-15 ENCOUNTER — Encounter: Payer: Self-pay | Admitting: Physician Assistant

## 2018-04-15 ENCOUNTER — Telehealth: Payer: Self-pay

## 2018-04-15 NOTE — Progress Notes (Signed)
Cardiology Office Note Date:  04/22/2018  Patient ID:  Ruthie, Berch 1941/02/27, MRN [id number] PCP:  Sharyne Peach, MD  Cardiologist:  Dr. Rockey Situ, MD    Chief Complaint: Hospital follow up  History of Present Illness: Darchelle Nunes is a 77 y.o. female with history of CAD with recent NSTEMI in mid 03/2018 s/p PCI as below, chronic systolic CHF secondary to ICM, COPD secondary to prior tobacco abuse quitting in 1990, HTN, HLD, breast cancer s/p chemoradiation, and GERD who presents for hospital follow up after recent admission to [facility] from 04/09/18 to 04/12/18 for NSTEMI.   Prior to the above admission, she did not have any previously known cardiac history. She was admitted to [facility] on 04/09/18 with chest pain and progression fatigue. Initial troponin was noted to be 2.71 and peaked at 18.30. EKG showed sinus rhythm with anterolateral st/t changes concerning for ischemia. CTA chest/abdomen/pelvis was negative for aortic dissection or PE. There was an incidental 6 mm pulmonary nodule noted in the left lower lobe. Echo on 04/10/2018 showed an EF of 30-35%, anteroseptal, anterior, and apical hypokinesis, Gr1DD, mild to moderate mitral regurgitation, mildly dilated left atrium, RVSF was normal. LHC on 04/10/2018 showed proximal LAD 95% stenosis, mid LAD 80% stenosis, mid to distal LCx 95% stenosis, OM3-1 lesion 40% stenosis, OM3-2 lesion 60% stenosis, mid RCA 50% stenosis. EF 35-45%. She underwent successful PCI/DES x 2 to the LAD with recommended staged PCI of the LCx in a few weeks. Discharge weight 65.6 kg.   Hospital labs: Troponin peaked at 18.30, potassium 2.8, SCr 0.98, WBC 11.1, HGB 9.8, PLT 276, magnesium 1.9, LDL 134  Discharge medications: Medication List    STOP taking these medications   KLOR-CON M20 20 MEQ tablet Generic drug:  potassium chloride SA   losartan-hydrochlorothiazide 100-25 MG tablet Commonly known as:  HYZAAR   potassium chloride SA 20 MEQ  tablet Commonly known as:  K-DUR,KLOR-CON     TAKE these medications   ADVAIR DISKUS 500-50 MCG/DOSE Aepb Generic drug:  Fluticasone-Salmeterol Inhale 1 puff into the lungs 2 (two) times daily.   albuterol 108 (90 Base) MCG/ACT inhaler Commonly known as:  PROVENTIL HFA;VENTOLIN HFA Inhale 2 puffs into the lungs every 6 (six) hours as needed for wheezing or shortness of breath.   aspirin EC 81 MG tablet Take 81 mg by mouth daily.   baclofen 10 MG tablet Commonly known as:  LIORESAL Take 10 mg by mouth at bedtime.   BAYER BACK & BODY PAIN EX ST PO Take 2 tablets by mouth 2 (two) times daily as needed (for back pain).   carvedilol 3.125 MG tablet Commonly known as:  COREG Take 1 tablet (3.125 mg total) by mouth 2 (two) times daily with a meal.   clopidogrel 75 MG tablet Commonly known as:  PLAVIX Take 1 tablet (75 mg total) by mouth daily with breakfast. Start taking on:  April 13, 2018   co-enzyme Q-10 30 MG capsule Take 30 mg by mouth daily.   DULoxetine 60 MG capsule Commonly known as:  CYMBALTA Take 60 mg by mouth every morning. Take with 30 mg to total 90 mg once daily   DULoxetine 30 MG capsule Commonly known as:  CYMBALTA Take 30 mg by mouth every morning. Take with 60 mg to total 90 mg once daily   enalapril 2.5 MG tablet Commonly known as:  VASOTEC Take 1 tablet (2.5 mg total) by mouth daily.   Fish Oil 1000 MG Caps  Take 1,000 mg by mouth daily.   furosemide 20 MG tablet Commonly known as:  LASIX Take 1 tablet (20 mg total) by mouth daily.   letrozole 2.5 MG tablet Commonly known as:  FEMARA Take 1 tablet (2.5 mg total) by mouth daily.   lidocaine-prilocaine cream Commonly known as:  EMLA Apply 1 application as needed topically (for port access).   magnesium oxide 400 MG tablet Commonly known as:  MAG-OX Take 400 mg by mouth daily.   Melatonin 5 MG Tabs Take 10 mg by mouth at bedtime as needed (sleep).   montelukast 10  MG tablet Commonly known as:  SINGULAIR Take 10 mg by mouth at bedtime.   omeprazole 20 MG capsule Commonly known as:  PRILOSEC Take 20 mg by mouth every morning.   potassium chloride 10 MEQ tablet Commonly known as:  K-DUR Take 1 tablet (10 mEq total) by mouth daily.   rosuvastatin 20 MG tablet Commonly known as:  CRESTOR Take 1 tablet (20 mg total) by mouth daily at 6 PM.     She comes in accompanied by her daughter today and is doing well from a cardiac perspective.  She has not had any symptoms concerning for chest pain or shortness of breath.  Her energy level continues to improve on a daily basis.  No falls since her discharge.  No BRBPR or melena.  She is compliant with all medications.  Blood pressure typically runs in the 1 10-2 15 systolic range.  She saw her PCP on 12/27 with labs showing a potassium improved to 4.0, serum creatinine 1.4, albumin 3.1.  Following her staged PCI, she would like to get back in to her water aerobics.  She denies any lower extremity swelling, orthopnea, PND, or early satiety.  She does not have a scale at home and has not been weighing herself.  She indicates she will pick 1 up today.   Past Medical History:  Diagnosis Date  . Anxiety   . Arthritis   . CAD (coronary artery disease)    a. NSTEMI 12/19; b. LHC 04/10/18: pLAD 95%, mLAD 80%, m-dLCx 95%, OM3-1 lesion 40%, OM3-2 lesion 60%, mRCA 50%, EF 35-45%, successful PCI/DES x 2 to the LAD with recommended staged PCI of the LCx in a few weeks  . Cancer [facility])    Right Breast Cancer  . Chronic systolic CHF (congestive heart failure) ([place])    a. TTE 12/19: EF 30-35%, anteroseptal, anterior, and apical HK, Gr1DD, mild to mod MR, mildly dilated LA, RVSF nl  . COPD (chronic obstructive pulmonary disease) ([place])   . Depression   . Dyspnea    with exertion  . GERD (gastroesophageal reflux disease)   . Hyperlipidemia   . Hypertension   . Personal history of chemotherapy   . Personal history of  radiation therapy     Past Surgical History:  Procedure Laterality Date  . ABDOMINAL HYSTERECTOMY  1990   Partial  . BREAST BIOPSY Right 10/04/2016   axilla lymph node and axillay tail mass biopsy. invasive mammary carcinoma  . BREAST LUMPECTOMY Right 03/21/2017  . BREAST LUMPECTOMY Right 05/21/2017   re-excision  . BREAST LUMPECTOMY WITH NEEDLE LOCALIZATION Right 03/21/2017   Procedure: BREAST LUMPECTOMY WITH NEEDLE LOCALIZATION;  Surgeon: Clayburn Pert, MD;  Location: [facility] ORS;  Service: General;  Laterality: Right;  . CORONARY STENT INTERVENTION N/A 04/10/2018   Procedure: CORONARY STENT INTERVENTION;  Surgeon: Wellington Hampshire, MD;  Location: [place] [facility];  Service: Cardiovascular;  Laterality: N/A;  . DILATION AND CURETTAGE OF UTERUS    . INGUINAL HERNIA REPAIR Right 03/26/2017   Procedure: HERNIA REPAIR INGUINAL INCARCERATED;  Surgeon: Jules Husbands, MD;  Location: [facility];  Service: General;  Laterality: Right;  . LEFT HEART CATH AND CORONARY ANGIOGRAPHY N/A 04/10/2018   Procedure: LEFT HEART CATH AND CORONARY ANGIOGRAPHY poss PCI;  Surgeon: Minna Merritts, MD;  Location: [place] [facility];  Service: Cardiovascular;  Laterality: N/A;  . PORTACATH PLACEMENT Left 10/24/2016   Procedure: INSERTION PORT-A-CATH;  Surgeon: [name], MD;  Location: [facility] OR[facility];  Service: General;  Laterality: Left;  . RE-EXCISION OF BREAST LUMPECTOMY Right 05/21/2017   Procedure: RE-EXCISION OF BREAST LUMPECTOMY;  Surgeon: Clayburn Pert, MD;  Location: [facility];  Service: General;  Laterality: Right;  . SENTINEL NODE BIOPSY Right 03/21/2017   Procedure: SENTINEL NODE BIOPSY;  Surgeon: Clayburn Pert, MD;  Location: [facility];  Service: General;  Laterality: Right;    Current Meds  Medication Sig  . ADVAIR DISKUS 500-50 MCG/DOSE AEPB Inhale 1 puff into the lungs 2 (two) times daily.   Marland Kitchen albuterol (PROVENTIL HFA;VENTOLIN HFA) 108 (90 Base) MCG/ACT inhaler Inhale 2 puffs into the  lungs every 6 (six) hours as needed for wheezing or shortness of breath.  Marland Kitchen aspirin EC 81 MG tablet Take 81 mg by mouth daily.  . Aspirin-Caffeine (BAYER BACK & BODY PAIN EX ST PO) Take 2 tablets by mouth 2 (two) times daily as needed (for back pain).   . baclofen (LIORESAL) 10 MG tablet Take 10 mg by mouth at bedtime.  . carvedilol (COREG) 3.125 MG tablet Take 1 tablet (3.125 mg total) by mouth 2 (two) times daily with a meal.  . clopidogrel (PLAVIX) 75 MG tablet Take 1 tablet (75 mg total) by mouth daily with breakfast.  . co-enzyme Q-10 30 MG capsule Take 30 mg by mouth daily.  . DULoxetine (CYMBALTA) 30 MG capsule Take 30 mg by mouth every morning. Take with 60 mg to total 90 mg once daily  . DULoxetine (CYMBALTA) 60 MG capsule Take 60 mg by mouth every morning. Take with 30 mg to total 90 mg once daily  . enalapril (VASOTEC) 2.5 MG tablet Take 1 tablet (2.5 mg total) by mouth daily.  . furosemide (LASIX) 20 MG tablet Take 1 tablet (20 mg total) by mouth daily.  Marland Kitchen letrozole (FEMARA) 2.5 MG tablet Take 1 tablet (2.5 mg total) by mouth daily.  Marland Kitchen lidocaine-prilocaine (EMLA) cream Apply 1 application as needed topically (for port access).  . magnesium oxide (MAG-OX) 400 MG tablet Take 400 mg by mouth daily.  . Melatonin 5 MG TABS Take 10 mg by mouth at bedtime as needed (sleep).  . montelukast (SINGULAIR) 10 MG tablet Take 10 mg by mouth at bedtime.   . Omega-3 Fatty Acids (FISH OIL) 1000 MG CAPS Take 1,000 mg by mouth daily.  Marland Kitchen omeprazole (PRILOSEC) 20 MG capsule Take 20 mg by mouth every morning.   . potassium chloride (K-DUR) 10 MEQ tablet Take 1 tablet (10 mEq total) by mouth daily.  . rosuvastatin (CRESTOR) 20 MG tablet Take 1 tablet (20 mg total) by mouth daily at 6 PM.    Allergies:   Nsaids; Penicillins; Atorvastatin; Gabapentin; Ezetimibe; and Aspirin   Social History:  The patient  reports that she quit smoking about 29 years ago. Her smoking use included cigarettes. She smoked 0.50  packs per day. She has never used smokeless tobacco. She reports that she  does not drink alcohol or use drugs.   Family History:  The patient's family history includes Colon cancer in her mother.  ROS:   Review of Systems  Constitutional: Positive for malaise/fatigue. Negative for chills, diaphoresis, fever and weight loss.  HENT: Negative for congestion.   Eyes: Negative for discharge and redness.  Respiratory: Negative for cough, hemoptysis, sputum production, shortness of breath and wheezing.   Cardiovascular: Negative for chest pain, palpitations, orthopnea, claudication, leg swelling and PND.  Gastrointestinal: Negative for abdominal pain, blood in stool, heartburn, melena, nausea and vomiting.  Genitourinary: Negative for hematuria.  Musculoskeletal: Negative for falls and myalgias.  Skin: Negative for rash.  Neurological: Negative for dizziness, tingling, tremors, sensory change, speech change, focal weakness, loss of consciousness and weakness.  Endo/Heme/Allergies: Does not bruise/bleed easily.  Psychiatric/Behavioral: Negative for substance abuse. The patient is not nervous/anxious.   All other systems reviewed and are negative.    PHYSICAL EXAM:  VS:  BP 126/70 (BP Location: Left Arm, Patient Position: Sitting, Cuff Size: Normal)   Pulse 75   Ht 5\' 2"  (1.575 m)   Wt 143 lb 8 oz (65.1 kg)   BMI 26.25 kg/m  BMI: Body mass index is 26.25 kg/m.  Physical Exam  Constitutional: She is oriented to person, place, and time. She appears well-developed and well-nourished.  HENT:  Head: Normocephalic and atraumatic.  Eyes: Right eye exhibits no discharge. Left eye exhibits no discharge.  Neck: Normal range of motion. No JVD present.  Cardiovascular: Normal rate, regular rhythm, S1 normal, S2 normal and normal heart sounds. Exam reveals no distant heart sounds, no friction rub, no midsystolic click and no opening snap.  No murmur heard. Pulses:      Posterior tibial pulses are 2+  on the right side and 2+ on the left side.  Pulmonary/Chest: Effort normal and breath sounds normal. No respiratory distress. She has no decreased breath sounds. She has no wheezes. She has no rales. She exhibits no tenderness.  Abdominal: Soft. She exhibits no distension. There is no abdominal tenderness.  Musculoskeletal:        General: No edema.  Neurological: She is alert and oriented to person, place, and time.  Skin: Skin is warm and dry. No cyanosis. Nails show no clubbing.  Psychiatric: She has a normal mood and affect. Her speech is normal and behavior is normal. Judgment and thought content normal.     EKG:  Was ordered and interpreted by me today. Shows NSR, 75 bpm, low voltage QRS, T wave inversion along leads I, aVL, V3 through V6 (similar to post PCI EKG on 12/19)  Recent Labs: 03/12/2018: ALT 14 04/11/2018: Hemoglobin 9.8; Platelets 276 04/12/2018: BUN 18; Creatinine, Ser 0.98; Magnesium 2.0; Potassium 2.8; Sodium 138  04/10/2018: Cholesterol 189; HDL 40; LDL Cholesterol 134; Total CHOL/HDL Ratio 4.7; Triglycerides 77; VLDL 15   Estimated Creatinine Clearance: 43.3 mL/min (by C-G formula based on SCr of 0.98 mg/dL).   Wt Readings from Last 3 Encounters:  04/22/18 143 lb 8 oz (65.1 kg)  04/11/18 144 lb 9.6 oz (65.6 kg)  04/08/18 147 lb 14.4 oz (67.1 kg)     Other studies reviewed: Additional studies/records reviewed today include: summarized above  ASSESSMENT AND PLAN:  1. CAD involving the native coronary arteries without angina with recent non-STEMI: She is doing well without any symptoms concerning for angina.  Continue dual antiplatelet therapy with aspirin and Plavix without interruption for at least the next 12 months, however this will be  extended given the need for staged PCI of the left circumflex.  We will plan for repeat cardiac cath in late 04/2018 for staged PCI of the left circumflex.  She remains on carvedilol and Crestor.  Aggressive risk factor  modification and secondary prevention.  Post-cath instructions.  2. HFrEF secondary to ICM: She does not appear grossly volume overloaded at this time.  EF noted to be 30 to 35% during recent admission as above.  Hopefully, with revascularization of the LAD and planned, staged intervention on the left circumflex in approximately 4 weeks, along with optimization of medical therapy, her EF will improve.  She will need repeat echocardiogram approximately 3 months after staged PCI of the left circumflex.  In this time, we will continue to optimize her medical therapy.  Should her EF remains less than 35% following the above interventions and optimization of medical therapy she will require referral to EP for consideration of ICD.  Continue carvedilol and enalapril.  Change Lasix to as needed shortness of breath, lower extremity lying, or weight gain.  Start spironolactone 12.5 mg daily with plans to recheck BMP in 1 week.  Recommend patient pick up a scale and weigh daily.  She has been advised to call for weight increase of 3 pounds overnight or 5 pounds in a 1 week time span.  CHF education.  3. Hypertension: Blood pressure well controlled today.  Continue low-dose Coreg and enalapril.  Start low-dose spironolactone as outlined above.  4. Hyperlipidemia: LDL of 134 during recent admission.  Goal LDL less than 70.  Liver function normal.  Intolerant to Lipitor and Zetia.  Tolerating Crestor 20 mg daily.  Check fasting lipid and liver function in approximately 8 weeks.  If LDL remains above goal would escalate Crestor to 40 mg daily.  5. Hypokalemia: Potassium was repleted to goal 4.0 by BMP on 12/27.  Stop KCl.  Start spironolactone 12.5 mg daily as above given her cardiomyopathy.  Recheck BMP 1 week.  6. AKI: Serum creatinine 1.4 when checked on 12/27 with a baseline approximately 1.1-1.2.  Change Lasix to as needed as above.  Recheck BMP 1 week.   Disposition: F/u with Dr. Rockey Situ or an APP in 6  weeks.  Current medicines are reviewed at length with the patient today.  The patient did not have any concerns regarding medicines.  Signed, Christell Faith, PA-C 04/22/2018 8:43 AM     [place] [address] [place],  [address] [phone]

## 2018-04-15 NOTE — H&P (View-Only) (Signed)
Cardiology Office Note Date:  04/22/2018  Patient ID:  Krista Cisneros, Krista Cisneros [birth year], MRN [id number] PCP:  Sharyne Peach, MD  Cardiologist:  Dr. Rockey Situ, MD    Chief Complaint: Hospital follow up  History of Present Illness: Krista Cisneros is a 77 y.o. 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   Prior to the above admission, she did not have any previously known cardiac history. She was admitted to [facility] on 04/09/18 with chest pain and progression fatigue. Initial troponin was noted to be 2.71 and peaked at 18.30. EKG showed sinus rhythm with anterolateral st/t changes concerning for ischemia. CTA chest/abdomen/pelvis was negative for aortic dissection or PE. There was an incidental 6 mm pulmonary nodule noted in the left lower lobe. Echo on 04/10/2018 showed an EF of 30-35%, anteroseptal, anterior, and apical hypokinesis, Gr1DD, mild to moderate mitral regurgitation, mildly dilated left atrium, RVSF was normal. LHC on 04/10/2018 showed proximal LAD 95% stenosis, mid LAD 80% stenosis, mid to distal LCx 95% stenosis, OM3-1 lesion 40% stenosis, OM3-2 lesion 60% stenosis, mid RCA 50% stenosis. EF 35-45%. She underwent successful PCI/DES x 2 to the LAD with recommended staged PCI of the LCx in a few weeks. Discharge weight 65.6 kg.   Hospital labs: Troponin peaked at 18.30, potassium 2.8, SCr 0.98, WBC 11.1, HGB 9.8, PLT 276, magnesium 1.9, LDL 134  Discharge medications: Medication List    STOP taking these medications   KLOR-CON M20 20 MEQ tablet Generic drug:  potassium chloride SA   losartan-hydrochlorothiazide 100-25 MG tablet Commonly known as:  HYZAAR   potassium chloride SA 20 MEQ  tablet Commonly known as:  K-DUR,KLOR-CON     TAKE these medications   ADVAIR DISKUS 500-50 MCG/DOSE Aepb Generic drug:  Fluticasone-Salmeterol Inhale 1 puff into the lungs 2 (two) times daily.   albuterol 108 (90 Base) MCG/ACT inhaler Commonly known as:  PROVENTIL HFA;VENTOLIN HFA Inhale 2 puffs into the lungs every 6 (six) hours as needed for wheezing or shortness of breath.   aspirin EC 81 MG tablet Take 81 mg by mouth daily.   baclofen 10 MG tablet Commonly known as:  LIORESAL Take 10 mg by mouth at bedtime.   BAYER BACK & BODY PAIN EX ST PO Take 2 tablets by mouth 2 (two) times daily as needed (for back pain).   carvedilol 3.125 MG tablet Commonly known as:  COREG Take 1 tablet (3.125 mg total) by mouth 2 (two) times daily with a meal.   clopidogrel 75 MG tablet Commonly known as:  PLAVIX Take 1 tablet (75 mg total) by mouth daily with breakfast. Start taking on:  April 13, 2018   co-enzyme Q-10 30 MG capsule Take 30 mg by mouth daily.   DULoxetine 60 MG capsule Commonly known as:  CYMBALTA Take 60 mg by mouth every morning. Take with 30 mg to total 90 mg once daily   DULoxetine 30 MG capsule Commonly known as:  CYMBALTA Take 30 mg by mouth every morning. Take with 60 mg to total 90 mg once daily   enalapril 2.5 MG tablet Commonly known as:  VASOTEC Take 1 tablet (2.5 mg total) by mouth daily.   Fish Oil 1000 MG Caps  Take 1,000 mg by mouth daily.   furosemide 20 MG tablet Commonly known as:  LASIX Take 1 tablet (20 mg total) by mouth daily.   letrozole 2.5 MG tablet Commonly known as:  FEMARA Take 1 tablet (2.5 mg total) by mouth daily.   lidocaine-prilocaine cream Commonly known as:  EMLA Apply 1 application as needed topically (for port access).   magnesium oxide 400 MG tablet Commonly known as:  MAG-OX Take 400 mg by mouth daily.   Melatonin 5 MG Tabs Take 10 mg by mouth at bedtime as needed (sleep).   montelukast 10  MG tablet Commonly known as:  SINGULAIR Take 10 mg by mouth at bedtime.   omeprazole 20 MG capsule Commonly known as:  PRILOSEC Take 20 mg by mouth every morning.   potassium chloride 10 MEQ tablet Commonly known as:  K-DUR Take 1 tablet (10 mEq total) by mouth daily.   rosuvastatin 20 MG tablet Commonly known as:  CRESTOR Take 1 tablet (20 mg total) by mouth daily at 6 PM.     She comes in accompanied by her daughter today and is doing well from a cardiac perspective.  She has not had any symptoms concerning for chest pain or shortness of breath.  Her energy level continues to improve on a daily basis.  No falls since her discharge.  No BRBPR or melena.  She is compliant with all medications.  Blood pressure typically runs in the 1 65-9 15 systolic range.  She saw her PCP on 12/27 with labs showing a potassium improved to 4.0, serum creatinine 1.4, albumin 3.1.  Following her staged PCI, she would like to get back in to her water aerobics.  She denies any lower extremity swelling, orthopnea, PND, or early satiety.  She does not have a scale at home and has not been weighing herself.  She indicates she will pick 1 up today.   Past Medical History:  Diagnosis Date  . Anxiety   . Arthritis   . CAD (coronary artery disease)    a. NSTEMI 12/19; b. LHC 04/10/18: pLAD 95%, mLAD 80%, m-dLCx 95%, OM3-1 lesion 40%, OM3-2 lesion 60%, mRCA 50%, EF 35-45%, successful PCI/DES x 2 to the LAD with recommended staged PCI of the LCx in a few weeks  . Cancer [facility])    Right Breast Cancer  . Chronic systolic CHF (congestive heart failure) ([place])    a. TTE 12/19: EF 30-35%, anteroseptal, anterior, and apical HK, Gr1DD, mild to mod MR, mildly dilated LA, RVSF nl  . COPD (chronic obstructive pulmonary disease) ([place])   . Depression   . Dyspnea    with exertion  . GERD (gastroesophageal reflux disease)   . Hyperlipidemia   . Hypertension   . Personal history of chemotherapy   . Personal history of  radiation therapy     Past Surgical History:  Procedure Laterality Date  . ABDOMINAL HYSTERECTOMY  1990   Partial  . BREAST BIOPSY Right 10/04/2016   axilla lymph node and axillay tail mass biopsy. invasive mammary carcinoma  . BREAST LUMPECTOMY Right 03/21/2017  . BREAST LUMPECTOMY Right 05/21/2017   re-excision  . BREAST LUMPECTOMY WITH NEEDLE LOCALIZATION Right 03/21/2017   Procedure: BREAST LUMPECTOMY WITH NEEDLE LOCALIZATION;  Surgeon: Clayburn Pert, MD;  Location: [facility] ORS;  Service: General;  Laterality: Right;  . CORONARY STENT INTERVENTION N/A 04/10/2018   Procedure: CORONARY STENT INTERVENTION;  Surgeon: Wellington Hampshire, MD;  Location: [place] CV LAB;  Service: Cardiovascular;  Laterality: N/A;  . DILATION AND CURETTAGE OF UTERUS    . INGUINAL HERNIA REPAIR Right 03/26/2017   Procedure: HERNIA REPAIR INGUINAL INCARCERATED;  Surgeon: Jules Husbands, MD;  Location: [facility] ORS;  Service: General;  Laterality: Right;  . LEFT HEART CATH AND CORONARY ANGIOGRAPHY N/A 04/10/2018   Procedure: LEFT HEART CATH AND CORONARY ANGIOGRAPHY poss PCI;  Surgeon: Minna Merritts, MD;  Location: [place] CV LAB;  Service: Cardiovascular;  Laterality: N/A;  . PORTACATH PLACEMENT Left 10/24/2016   Procedure: INSERTION PORT-A-CATH;  Surgeon: [name], MD;  Location: [facility] ORS;  Service: General;  Laterality: Left;  . RE-EXCISION OF BREAST LUMPECTOMY Right 05/21/2017   Procedure: RE-EXCISION OF BREAST LUMPECTOMY;  Surgeon: Clayburn Pert, MD;  Location: [facility] ORS;  Service: General;  Laterality: Right;  . SENTINEL NODE BIOPSY Right 03/21/2017   Procedure: SENTINEL NODE BIOPSY;  Surgeon: Clayburn Pert, MD;  Location: [facility] ORS;  Service: General;  Laterality: Right;    Current Meds  Medication Sig  . ADVAIR DISKUS 500-50 MCG/DOSE AEPB Inhale 1 puff into the lungs 2 (two) times daily.   Marland Kitchen albuterol (PROVENTIL HFA;VENTOLIN HFA) 108 (90 Base) MCG/ACT inhaler Inhale 2 puffs into the  lungs every 6 (six) hours as needed for wheezing or shortness of breath.  Marland Kitchen aspirin EC 81 MG tablet Take 81 mg by mouth daily.  . Aspirin-Caffeine (BAYER BACK & BODY PAIN EX ST PO) Take 2 tablets by mouth 2 (two) times daily as needed (for back pain).   . baclofen (LIORESAL) 10 MG tablet Take 10 mg by mouth at bedtime.  . carvedilol (COREG) 3.125 MG tablet Take 1 tablet (3.125 mg total) by mouth 2 (two) times daily with a meal.  . clopidogrel (PLAVIX) 75 MG tablet Take 1 tablet (75 mg total) by mouth daily with breakfast.  . co-enzyme Q-10 30 MG capsule Take 30 mg by mouth daily.  . DULoxetine (CYMBALTA) 30 MG capsule Take 30 mg by mouth every morning. Take with 60 mg to total 90 mg once daily  . DULoxetine (CYMBALTA) 60 MG capsule Take 60 mg by mouth every morning. Take with 30 mg to total 90 mg once daily  . enalapril (VASOTEC) 2.5 MG tablet Take 1 tablet (2.5 mg total) by mouth daily.  . furosemide (LASIX) 20 MG tablet Take 1 tablet (20 mg total) by mouth daily.  Marland Kitchen letrozole (FEMARA) 2.5 MG tablet Take 1 tablet (2.5 mg total) by mouth daily.  Marland Kitchen lidocaine-prilocaine (EMLA) cream Apply 1 application as needed topically (for port access).  . magnesium oxide (MAG-OX) 400 MG tablet Take 400 mg by mouth daily.  . Melatonin 5 MG TABS Take 10 mg by mouth at bedtime as needed (sleep).  . montelukast (SINGULAIR) 10 MG tablet Take 10 mg by mouth at bedtime.   . Omega-3 Fatty Acids (FISH OIL) 1000 MG CAPS Take 1,000 mg by mouth daily.  Marland Kitchen omeprazole (PRILOSEC) 20 MG capsule Take 20 mg by mouth every morning.   . potassium chloride (K-DUR) 10 MEQ tablet Take 1 tablet (10 mEq total) by mouth daily.  . rosuvastatin (CRESTOR) 20 MG tablet Take 1 tablet (20 mg total) by mouth daily at 6 PM.    Allergies:   Nsaids; Penicillins; Atorvastatin; Gabapentin; Ezetimibe; and Aspirin   Social History:  The patient  reports that she quit smoking about 29 years ago. Her smoking use included cigarettes. She smoked 0.50  packs per day. She has never used smokeless tobacco. She reports that she  does not drink alcohol or use drugs.   Family History:  The patient's family history includes Colon cancer in her mother.  ROS:   Review of Systems  Constitutional: Positive for malaise/fatigue. Negative for chills, diaphoresis, fever and weight loss.  HENT: Negative for congestion.   Eyes: Negative for discharge and redness.  Respiratory: Negative for cough, hemoptysis, sputum production, shortness of breath and wheezing.   Cardiovascular: Negative for chest pain, palpitations, orthopnea, claudication, leg swelling and PND.  Gastrointestinal: Negative for abdominal pain, blood in stool, heartburn, melena, nausea and vomiting.  Genitourinary: Negative for hematuria.  Musculoskeletal: Negative for falls and myalgias.  Skin: Negative for rash.  Neurological: Negative for dizziness, tingling, tremors, sensory change, speech change, focal weakness, loss of consciousness and weakness.  Endo/Heme/Allergies: Does not bruise/bleed easily.  Psychiatric/Behavioral: Negative for substance abuse. The patient is not nervous/anxious.   All other systems reviewed and are negative.    PHYSICAL EXAM:  VS:  BP 126/70 (BP Location: Left Arm, Patient Position: Sitting, Cuff Size: Normal)   Pulse 75   Ht 5\' 2"  (1.575 m)   Wt 143 lb 8 oz (65.1 kg)   BMI 26.25 kg/m  BMI: Body mass index is 26.25 kg/m.  Physical Exam  Constitutional: She is oriented to person, place, and time. She appears well-developed and well-nourished.  HENT:  Head: Normocephalic and atraumatic.  Eyes: Right eye exhibits no discharge. Left eye exhibits no discharge.  Neck: Normal range of motion. No JVD present.  Cardiovascular: Normal rate, regular rhythm, S1 normal, S2 normal and normal heart sounds. Exam reveals no distant heart sounds, no friction rub, no midsystolic click and no opening snap.  No murmur heard. Pulses:      Posterior tibial pulses are 2+  on the right side and 2+ on the left side.  Pulmonary/Chest: Effort normal and breath sounds normal. No respiratory distress. She has no decreased breath sounds. She has no wheezes. She has no rales. She exhibits no tenderness.  Abdominal: Soft. She exhibits no distension. There is no abdominal tenderness.  Musculoskeletal:        General: No edema.  Neurological: She is alert and oriented to person, place, and time.  Skin: Skin is warm and dry. No cyanosis. Nails show no clubbing.  Psychiatric: She has a normal mood and affect. Her speech is normal and behavior is normal. Judgment and thought content normal.     EKG:  Was ordered and interpreted by me today. Shows NSR, 75 bpm, low voltage QRS, T wave inversion along leads I, aVL, V3 through V6 (similar to post PCI EKG on 12/19)  Recent Labs: 03/12/2018: ALT 14 04/11/2018: Hemoglobin 9.8; Platelets 276 04/12/2018: BUN 18; Creatinine, Ser 0.98; Magnesium 2.0; Potassium 2.8; Sodium 138  04/10/2018: Cholesterol 189; HDL 40; LDL Cholesterol 134; Total CHOL/HDL Ratio 4.7; Triglycerides 77; VLDL 15   Estimated Creatinine Clearance: 43.3 mL/min (by C-G formula based on SCr of 0.98 mg/dL).   Wt Readings from Last 3 Encounters:  04/22/18 143 lb 8 oz (65.1 kg)  04/11/18 144 lb 9.6 oz (65.6 kg)  04/08/18 147 lb 14.4 oz (67.1 kg)     Other studies reviewed: Additional studies/records reviewed today include: summarized above  ASSESSMENT AND PLAN:  1. CAD involving the native coronary arteries without angina with recent non-STEMI: She is doing well without any symptoms concerning for angina.  Continue dual antiplatelet therapy with aspirin and Plavix without interruption for at least the next 12 months, however this will be  extended given the need for staged PCI of the left circumflex.  We will plan for repeat cardiac cath in late 04/2018 for staged PCI of the left circumflex.  She remains on carvedilol and Crestor.  Aggressive risk factor  modification and secondary prevention.  Post-cath instructions.  2. HFrEF secondary to ICM: She does not appear grossly volume overloaded at this time.  EF noted to be 30 to 35% during recent admission as above.  Hopefully, with revascularization of the LAD and planned, staged intervention on the left circumflex in approximately 4 weeks, along with optimization of medical therapy, her EF will improve.  She will need repeat echocardiogram approximately 3 months after staged PCI of the left circumflex.  In this time, we will continue to optimize her medical therapy.  Should her EF remains less than 35% following the above interventions and optimization of medical therapy she will require referral to EP for consideration of ICD.  Continue carvedilol and enalapril.  Change Lasix to as needed shortness of breath, lower extremity lying, or weight gain.  Start spironolactone 12.5 mg daily with plans to recheck BMP in 1 week.  Recommend patient pick up a scale and weigh daily.  She has been advised to call for weight increase of 3 pounds overnight or 5 pounds in a 1 week time span.  CHF education.  3. Hypertension: Blood pressure well controlled today.  Continue low-dose Coreg and enalapril.  Start low-dose spironolactone as outlined above.  4. Hyperlipidemia: LDL of 134 during recent admission.  Goal LDL less than 70.  Liver function normal.  Intolerant to Lipitor and Zetia.  Tolerating Crestor 20 mg daily.  Check fasting lipid and liver function in approximately 8 weeks.  If LDL remains above goal would escalate Crestor to 40 mg daily.  5. Hypokalemia: Potassium was repleted to goal 4.0 by BMP on 12/27.  Stop KCl.  Start spironolactone 12.5 mg daily as above given her cardiomyopathy.  Recheck BMP 1 week.  6. AKI: Serum creatinine 1.4 when checked on 12/27 with a baseline approximately 1.1-1.2.  Change Lasix to as needed as above.  Recheck BMP 1 week.   Disposition: F/u with Dr. Rockey Situ or an APP in 6  weeks.  Current medicines are reviewed at length with the patient today.  The patient did not have any concerns regarding medicines.  Signed, Christell Faith, PA-C 04/22/2018 8:43 AM     [facility] [address] [place] [address] [place], [place] [address] [phone]

## 2018-04-15 NOTE — Telephone Encounter (Signed)
Flagged on EMMI report for not reading discharge papers and not knowing who to call about changes in condition.  First attempt to reach patient made, however unable to reach patient.  Unable to leave message as mailbox full.  Will attempt at later time.

## 2018-04-19 NOTE — Telephone Encounter (Signed)
CSW attempted to call patient to follow up on Emmi call from patient expressing she's lost interest in things.  Patient's voice mail was unable to accept anymore calls.  Jones Broom. [place], MSW, [place]  04/19/2018 2:42 PM

## 2018-04-22 ENCOUNTER — Ambulatory Visit: Payer: Medicare PPO | Admitting: Physician Assistant

## 2018-04-22 ENCOUNTER — Encounter: Payer: Self-pay | Admitting: Physician Assistant

## 2018-04-22 VITALS — BP 126/70 | HR 75 | Ht 62.0 in | Wt 143.5 lb

## 2018-04-22 DIAGNOSIS — I2510 Atherosclerotic heart disease of native coronary artery without angina pectoris: Secondary | ICD-10-CM | POA: Insufficient documentation

## 2018-04-22 DIAGNOSIS — I214 Non-ST elevation (NSTEMI) myocardial infarction: Secondary | ICD-10-CM | POA: Insufficient documentation

## 2018-04-22 DIAGNOSIS — E785 Hyperlipidemia, unspecified: Secondary | ICD-10-CM

## 2018-04-22 DIAGNOSIS — I5022 Chronic systolic (congestive) heart failure: Secondary | ICD-10-CM | POA: Diagnosis not present

## 2018-04-22 DIAGNOSIS — E876 Hypokalemia: Secondary | ICD-10-CM

## 2018-04-22 DIAGNOSIS — I10 Essential (primary) hypertension: Secondary | ICD-10-CM | POA: Diagnosis not present

## 2018-04-22 DIAGNOSIS — I255 Ischemic cardiomyopathy: Secondary | ICD-10-CM | POA: Diagnosis not present

## 2018-04-22 DIAGNOSIS — N179 Acute kidney failure, unspecified: Secondary | ICD-10-CM

## 2018-04-22 MED ORDER — SPIRONOLACTONE 25 MG PO TABS: 12.5000 mg | ORAL_TABLET | Freq: Every day | ORAL | 1 refills | Status: DC

## 2018-04-22 MED ORDER — FUROSEMIDE 20 MG PO TABS: 20.0000 mg | ORAL_TABLET | ORAL | 11 refills | Status: DC | PRN

## 2018-04-22 NOTE — Patient Instructions (Addendum)
Medication Instructions:  CHANGE how you take the Furosemide to as needed for swelling STOP the potassium START Spironolactone 12.5 mg daily (half a tablet)  If you need a refill on your cardiac medications before your next appointment, please call your pharmacy.   Lab work: Your provider would like for you to return in one week to have the following labs drawn: BMET and CBC. Please go to the [facility] entrance and check in at the front desk. You do not need an appointment.   Testing/Procedures: Your physician has requested that you have a cardiac catheterization. Cardiac catheterization is used to diagnose and/or treat various heart conditions. Doctors may recommend this procedure for a number of different reasons. The most common reason is to evaluate chest pain. Chest pain can be a symptom of coronary artery disease (CAD), and cardiac catheterization can show whether plaque is narrowing or blocking your heart's arteries. This procedure is also used to evaluate the valves, as well as measure the blood flow and oxygen levels in different parts of your heart. For further information please visit [email or web address]. Please follow instruction sheet, as given.   Follow-Up: At [facility], you and your health needs are our priority.  As part of our continuing mission to provide you with exceptional heart care, we have created designated Provider Care Teams.  These Care Teams include your primary Cardiologist (physician) and Advanced Practice Providers (APPs -  Physician Assistants and Nurse Practitioners) who all work together to provide you with the care you need, when you need it. You will need a follow up appointment in 6 weeks.  You may see Dr. Rockey Situ or one of the following Advanced Practice Providers on your designated Care Team:   Murray Hodgkins, NP Christell Faith, PA-C . Marrianne Mood, PA-C  Any Other Special Instructions Will Be Listed Below (If Applicable).  [address] [place], [place] [address] Dept: [phone] Loc: [phone]   You are scheduled for a Cardiac Catheterization on Tuesday, January 21 with Dr. Harrell Gave End.  1. Arrive at the [place] entrance at 6:30, one hour prior to your procedure. Free valet service is available.  After entering the [place] please check-in at the registration desk (1st desk on your right) to receive your armband. After receiving your armband someone will escort you to the cardiac cath/special procedures waiting area.  If you have any questions, please call our office at [phone], or you may call the cardiac cath lab at [facility] directly at [phone]   Special note: Every effort is made to have your procedure done on time. Please understand that emergencies sometimes delay scheduled procedures.   2. Diet: Do not eat solid foods after midnight.  The patient may have clear liquids until 5am upon the day of the procedure.  3. Labs: You will need to have blood drawn in one week. You may go to [facility] medical mall. You do not need an appointment. You do not need to be fasting.  4. Medication instructions in preparation for your procedure: Hold Spironolactone the morning of the procedure   On the morning of your procedure, take your Aspirin and Plavix/Clopidogrel and any morning medicines NOT listed above.  You may use sips of water.  5. Plan for one night stay--bring personal belongings. 6. Bring a current list of your medications and current insurance cards. 7. You MUST have a responsible person to drive you home. 8.  Someone MUST be with you the first 24 hours after you arrive home or your discharge will be delayed. 9. Please wear clothes that are easy to get on and off and wear slip-on shoes.  Thank you for allowing [place] to care for you!   -- [facility]

## 2018-04-25 ENCOUNTER — Ambulatory Visit: Payer: Medicare PPO | Admitting: Physician Assistant

## 2018-05-01 ENCOUNTER — Ambulatory Visit
Admission: RE | Admit: 2018-05-01 | Discharge: 2018-05-01 | Disposition: A | Discharge status: Home / Self Care | Payer: Medicare PPO | Source: Ambulatory Visit | Attending: Radiation Oncology | Admitting: Radiation Oncology

## 2018-05-01 ENCOUNTER — Encounter: Payer: Self-pay | Admitting: Radiation Oncology

## 2018-05-01 ENCOUNTER — Other Ambulatory Visit: Payer: Self-pay

## 2018-05-01 ENCOUNTER — Other Ambulatory Visit
Admission: RE | Admit: 2018-05-01 | Discharge: 2018-05-01 | Disposition: A | Discharge status: Home / Self Care | Payer: Medicare Other | Source: Ambulatory Visit | Attending: Physician Assistant | Admitting: Physician Assistant

## 2018-05-01 VITALS — BP 144/75 | HR 79 | Temp 97.9°F | Resp 18 | Wt 150.5 lb

## 2018-05-01 DIAGNOSIS — I10 Essential (primary) hypertension: Secondary | ICD-10-CM | POA: Diagnosis present

## 2018-05-01 DIAGNOSIS — Z170 Estrogen receptor positive status [ER+]: Principal | ICD-10-CM

## 2018-05-01 DIAGNOSIS — C50411 Malignant neoplasm of upper-outer quadrant of right female breast: Secondary | ICD-10-CM

## 2018-05-01 DIAGNOSIS — E785 Hyperlipidemia, unspecified: Secondary | ICD-10-CM | POA: Diagnosis present

## 2018-05-01 DIAGNOSIS — N179 Acute kidney failure, unspecified: Secondary | ICD-10-CM

## 2018-05-01 LAB — CBC
HEMATOCRIT: 33.1 % — AB (ref 36.0–46.0)
Hemoglobin: 9.6 g/dL — ABNORMAL LOW (ref 12.0–15.0)
MCH: 25.4 pg — ABNORMAL LOW (ref 26.0–34.0)
MCHC: 29 g/dL — ABNORMAL LOW (ref 30.0–36.0)
MCV: 87.6 fL (ref 80.0–100.0)
Platelets: 317 10*3/uL (ref 150–400)
RBC: 3.78 MIL/uL — ABNORMAL LOW (ref 3.87–5.11)
RDW: 17.7 % — ABNORMAL HIGH (ref 11.5–15.5)
WBC: 9.8 10*3/uL (ref 4.0–10.5)
nRBC: 0 % (ref 0.0–0.2)

## 2018-05-01 LAB — BASIC METABOLIC PANEL
Anion gap: 8 (ref 5–15)
BUN: 15 mg/dL (ref 8–23)
CO2: 25 mmol/L (ref 22–32)
Calcium: 8.9 mg/dL (ref 8.9–10.3)
Chloride: 107 mmol/L (ref 98–111)
Creatinine, Ser: 1.17 mg/dL — ABNORMAL HIGH (ref 0.44–1.00)
GFR calc non Af Amer: 45 mL/min — ABNORMAL LOW (ref >60)
GFR, EST AFRICAN AMERICAN: 52 mL/min — AB (ref >60)
Glucose, Bld: 90 mg/dL (ref 70–99)
Potassium: 4.7 mmol/L (ref 3.5–5.1)
Sodium: 140 mmol/L (ref 135–145)

## 2018-05-01 NOTE — Progress Notes (Signed)
Radiation Oncology Follow up Note  Name: Krista Cisneros   Date:   05/01/2018 MRN:  [id number] DOB: 1940-05-23    This 78 y.o. 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 She specifically denies breast tenderness cough or bone pain..patient had recently had an MI with stent placementand is currently on aspirin and Plavix for her coronary artery disease.she is currently on letrozole following that well without side effect. Has not had a recent mammogram.  COMPLICATIONS OF TREATMENT: none  FOLLOW UP COMPLIANCE: keeps appointments   PHYSICAL EXAM:  BP (!) 144/75 (BP Location: Left Arm, Patient Position: Sitting)   Pulse 79   Temp 97.9 F (36.6 C) (Tympanic)   Resp 18   Wt 150 lb 7.4 oz (68.2 kg)   BMI 27.52 kg/m  Lungs are clear to A&P cardiac examination essentially unremarkable with regular rate and rhythm. No dominant mass or nodularity is noted in either breast in 2 positions examined. Incision is well-healed. No axillary or supraclavicular adenopathy is appreciated. Cosmetic result is excellent.Well-developed well-nourished patient in NAD. HEENT reveals PERLA, EOMI, discs not visualized.  Oral cavity is clear. No oral mucosal lesions are identified. Neck is clear without evidence of cervical or supraclavicular adenopathy. Lungs are clear to A&P. Cardiac examination is essentially unremarkable with regular rate and rhythm without murmur rub or thrill. Abdomen is benign with no  organomegaly or masses noted. Motor sensory and DTR levels are equal and symmetric in the upper and lower extremities. Cranial nerves II through XII are grossly intact. Proprioception is intact. No peripheral adenopathy or edema is identified. No motor or sensory levels are noted. Crude visual fields are within normal range.  RADIOLOGY RESULTS: no current films for review  PLAN: present time from a breast standpoint she is doing well with no evidence of disease. Will be scheduled follow-up mammograms in the near future. She is currently being evaluated for possible further stent placement. I have asked to see her back in 1 year for follow-up. Patient is to call at anytime with any concerns. She continues on letrozole without side effect.  I would like to take this opportunity to thank you for allowing me to participate in the care of your patient.Noreene Filbert, MD

## 2018-05-02 ENCOUNTER — Telehealth: Payer: Self-pay | Admitting: Physician Assistant

## 2018-05-02 DIAGNOSIS — Z01812 Encounter for preprocedural laboratory examination: Secondary | ICD-10-CM

## 2018-05-02 DIAGNOSIS — I2510 Atherosclerotic heart disease of native coronary artery without angina pectoris: Secondary | ICD-10-CM

## 2018-05-02 NOTE — Telephone Encounter (Signed)
I spoke with the patient regarding her lab results. She is aware to increase her water intake and to have a repeat BMP/ CBC on 1/16 or 1/17 at the [facility] lab. The patient voices understanding and is agreeable.   BMP/ CBC orders entered.

## 2018-05-02 NOTE — Telephone Encounter (Signed)
Notes recorded by Rise Mu, PA-C on 05/01/2018 at 5:57 PM EST Pre-cath labs showed slight increase in renal function. Please increase water intake.  Potassium at goal.  HGB is low, though stable. I would like a repeat BMET and CBC a couple of days prior to her planned cath on 1/21.

## 2018-05-09 ENCOUNTER — Other Ambulatory Visit
Admission: RE | Admit: 2018-05-09 | Discharge: 2018-05-09 | Disposition: A | Discharge status: Home / Self Care | Payer: Medicare Other | Source: Ambulatory Visit | Attending: Internal Medicine | Admitting: Internal Medicine

## 2018-05-09 DIAGNOSIS — I2510 Atherosclerotic heart disease of native coronary artery without angina pectoris: Secondary | ICD-10-CM

## 2018-05-09 DIAGNOSIS — Z01812 Encounter for preprocedural laboratory examination: Secondary | ICD-10-CM | POA: Insufficient documentation

## 2018-05-09 LAB — CBC WITH DIFFERENTIAL/PLATELET
Abs Immature Granulocytes: 0.03 10*3/uL (ref 0.00–0.07)
Basophils Absolute: 0.1 10*3/uL (ref 0.0–0.1)
Basophils Relative: 1 %
Eosinophils Absolute: 0.5 10*3/uL (ref 0.0–0.5)
Eosinophils Relative: 5 %
HCT: 34.6 % — ABNORMAL LOW (ref 36.0–46.0)
Hemoglobin: 9.9 g/dL — ABNORMAL LOW (ref 12.0–15.0)
Immature Granulocytes: 0 %
Lymphocytes Relative: 21 %
Lymphs Abs: 2 10*3/uL (ref 0.7–4.0)
MCH: 25.1 pg — ABNORMAL LOW (ref 26.0–34.0)
MCHC: 28.6 g/dL — ABNORMAL LOW (ref 30.0–36.0)
MCV: 87.6 fL (ref 80.0–100.0)
Monocytes Absolute: 0.9 10*3/uL (ref 0.1–1.0)
Monocytes Relative: 10 %
Neutro Abs: 5.9 10*3/uL (ref 1.7–7.7)
Neutrophils Relative %: 63 %
Platelets: 359 10*3/uL (ref 150–400)
RBC: 3.95 MIL/uL (ref 3.87–5.11)
RDW: 17.1 % — ABNORMAL HIGH (ref 11.5–15.5)
WBC: 9.3 10*3/uL (ref 4.0–10.5)
nRBC: 0 % (ref 0.0–0.2)

## 2018-05-09 LAB — BASIC METABOLIC PANEL
Anion gap: 4 — ABNORMAL LOW (ref 5–15)
BUN: 21 mg/dL (ref 8–23)
CHLORIDE: 111 mmol/L (ref 98–111)
CO2: 27 mmol/L (ref 22–32)
Calcium: 9.1 mg/dL (ref 8.9–10.3)
Creatinine, Ser: 1.1 mg/dL — ABNORMAL HIGH (ref 0.44–1.00)
GFR calc Af Amer: 56 mL/min — ABNORMAL LOW (ref >60)
GFR calc non Af Amer: 49 mL/min — ABNORMAL LOW (ref >60)
Glucose, Bld: 142 mg/dL — ABNORMAL HIGH (ref 70–99)
Potassium: 4.8 mmol/L (ref 3.5–5.1)
SODIUM: 142 mmol/L (ref 135–145)

## 2018-05-14 ENCOUNTER — Encounter
Admission: RE | Disposition: A | Discharge status: Home / Self Care | Payer: Self-pay | Source: Home / Self Care | Attending: Internal Medicine

## 2018-05-14 ENCOUNTER — Encounter: Payer: Self-pay | Admitting: Emergency Medicine

## 2018-05-14 ENCOUNTER — Ambulatory Visit
Admission: RE | Admit: 2018-05-14 | Discharge: 2018-05-15 | Disposition: A | Discharge status: Home / Self Care | Payer: Medicare Other | Attending: Internal Medicine | Admitting: Internal Medicine

## 2018-05-14 ENCOUNTER — Other Ambulatory Visit: Payer: Self-pay

## 2018-05-14 DIAGNOSIS — I130 Hypertensive heart and chronic kidney disease with heart failure and stage 1 through stage 4 chronic kidney disease, or unspecified chronic kidney disease: Secondary | ICD-10-CM | POA: Insufficient documentation

## 2018-05-14 DIAGNOSIS — I061 Rheumatic aortic insufficiency: Secondary | ICD-10-CM | POA: Insufficient documentation

## 2018-05-14 DIAGNOSIS — Z880 Allergy status to penicillin: Secondary | ICD-10-CM | POA: Diagnosis not present

## 2018-05-14 DIAGNOSIS — Z886 Allergy status to analgesic agent status: Secondary | ICD-10-CM | POA: Insufficient documentation

## 2018-05-14 DIAGNOSIS — I2510 Atherosclerotic heart disease of native coronary artery without angina pectoris: Secondary | ICD-10-CM | POA: Insufficient documentation

## 2018-05-14 DIAGNOSIS — Z7951 Long term (current) use of inhaled steroids: Secondary | ICD-10-CM | POA: Insufficient documentation

## 2018-05-14 DIAGNOSIS — Z79899 Other long term (current) drug therapy: Secondary | ICD-10-CM | POA: Diagnosis not present

## 2018-05-14 DIAGNOSIS — I2584 Coronary atherosclerosis due to calcified coronary lesion: Secondary | ICD-10-CM | POA: Diagnosis not present

## 2018-05-14 DIAGNOSIS — I25119 Atherosclerotic heart disease of native coronary artery with unspecified angina pectoris: Secondary | ICD-10-CM | POA: Diagnosis not present

## 2018-05-14 DIAGNOSIS — Z7902 Long term (current) use of antithrombotics/antiplatelets: Secondary | ICD-10-CM | POA: Insufficient documentation

## 2018-05-14 DIAGNOSIS — E876 Hypokalemia: Secondary | ICD-10-CM | POA: Diagnosis not present

## 2018-05-14 DIAGNOSIS — Z87891 Personal history of nicotine dependence: Secondary | ICD-10-CM | POA: Insufficient documentation

## 2018-05-14 DIAGNOSIS — I214 Non-ST elevation (NSTEMI) myocardial infarction: Secondary | ICD-10-CM | POA: Insufficient documentation

## 2018-05-14 DIAGNOSIS — Z955 Presence of coronary angioplasty implant and graft: Secondary | ICD-10-CM | POA: Diagnosis not present

## 2018-05-14 DIAGNOSIS — J449 Chronic obstructive pulmonary disease, unspecified: Secondary | ICD-10-CM | POA: Diagnosis not present

## 2018-05-14 DIAGNOSIS — M1990 Unspecified osteoarthritis, unspecified site: Secondary | ICD-10-CM | POA: Insufficient documentation

## 2018-05-14 DIAGNOSIS — E785 Hyperlipidemia, unspecified: Secondary | ICD-10-CM | POA: Diagnosis not present

## 2018-05-14 DIAGNOSIS — Z888 Allergy status to other drugs, medicaments and biological substances status: Secondary | ICD-10-CM | POA: Insufficient documentation

## 2018-05-14 DIAGNOSIS — I5022 Chronic systolic (congestive) heart failure: Secondary | ICD-10-CM | POA: Diagnosis not present

## 2018-05-14 DIAGNOSIS — N183 Chronic kidney disease, stage 3 (moderate): Secondary | ICD-10-CM | POA: Diagnosis not present

## 2018-05-14 DIAGNOSIS — K219 Gastro-esophageal reflux disease without esophagitis: Secondary | ICD-10-CM | POA: Insufficient documentation

## 2018-05-14 DIAGNOSIS — I252 Old myocardial infarction: Secondary | ICD-10-CM | POA: Insufficient documentation

## 2018-05-14 DIAGNOSIS — Z90711 Acquired absence of uterus with remaining cervical stump: Secondary | ICD-10-CM | POA: Diagnosis not present

## 2018-05-14 HISTORY — PX: CORONARY STENT INTERVENTION: CATH118234

## 2018-05-14 LAB — CBC
HCT: 30.5 % — ABNORMAL LOW (ref 36.0–46.0)
HEMOGLOBIN: 9 g/dL — AB (ref 12.0–15.0)
MCH: 24.9 pg — ABNORMAL LOW (ref 26.0–34.0)
MCHC: 29.5 g/dL — ABNORMAL LOW (ref 30.0–36.0)
MCV: 84.5 fL (ref 80.0–100.0)
Platelets: 304 10*3/uL (ref 150–400)
RBC: 3.61 MIL/uL — AB (ref 3.87–5.11)
RDW: 16.9 % — ABNORMAL HIGH (ref 11.5–15.5)
WBC: 8.3 10*3/uL (ref 4.0–10.5)
nRBC: 0 % (ref 0.0–0.2)

## 2018-05-14 LAB — POCT ACTIVATED CLOTTING TIME
Activated Clotting Time: 241 seconds
Activated Clotting Time: 274 seconds

## 2018-05-14 LAB — CREATININE, SERUM
Creatinine, Ser: 1.2 mg/dL — ABNORMAL HIGH (ref 0.44–1.00)
GFR calc Af Amer: 51 mL/min — ABNORMAL LOW (ref >60)
GFR calc non Af Amer: 44 mL/min — ABNORMAL LOW (ref >60)

## 2018-05-14 SURGERY — CORONARY STENT INTERVENTION
Anesthesia: Moderate Sedation

## 2018-05-14 SURGERY — LEFT HEART CATH AND CORONARY ANGIOGRAPHY
Anesthesia: Moderate Sedation | Laterality: Left

## 2018-05-14 MED ORDER — BACLOFEN 10 MG PO TABS
10.0000 mg | ORAL_TABLET | Freq: Every day | ORAL | Status: DC
  Administered 2018-05-14: 10 mg via ORAL
  Filled 2018-05-14 (×2): qty 1

## 2018-05-14 MED ORDER — CLOPIDOGREL BISULFATE 300 MG PO TABS
ORAL_TABLET | ORAL | Status: AC
  Filled 2018-05-14: qty 1

## 2018-05-14 MED ORDER — NITROGLYCERIN 5 MG/ML IV SOLN
INTRAVENOUS | Status: AC
  Filled 2018-05-14: qty 10

## 2018-05-14 MED ORDER — SODIUM CHLORIDE 0.9 % IV SOLN: INTRAVENOUS | Status: AC

## 2018-05-14 MED ORDER — ONDANSETRON HCL 4 MG/2ML IJ SOLN: 4.0000 mg | Freq: Four times a day (QID) | INTRAMUSCULAR | Status: DC | PRN

## 2018-05-14 MED ORDER — SODIUM CHLORIDE 0.9% FLUSH: 3.0000 mL | INTRAVENOUS | Status: DC | PRN

## 2018-05-14 MED ORDER — HEPARIN (PORCINE) IN NACL 1000-0.9 UT/500ML-% IV SOLN
INTRAVENOUS | Status: AC
  Filled 2018-05-14: qty 1000

## 2018-05-14 MED ORDER — VERAPAMIL HCL 2.5 MG/ML IV SOLN
INTRAVENOUS | Status: AC
  Filled 2018-05-14: qty 2

## 2018-05-14 MED ORDER — IOPAMIDOL (ISOVUE-300) INJECTION 61%
INTRAVENOUS | Status: DC | PRN
  Administered 2018-05-14: 65 mL via INTRA_ARTERIAL

## 2018-05-14 MED ORDER — SODIUM CHLORIDE 0.9 % IV SOLN: 250.0000 mL | INTRAVENOUS | Status: DC | PRN

## 2018-05-14 MED ORDER — HEPARIN SODIUM (PORCINE) 1000 UNIT/ML IJ SOLN
INTRAMUSCULAR | Status: DC | PRN
  Administered 2018-05-14: 3000 [IU] via INTRAVENOUS
  Administered 2018-05-14: 2000 [IU] via INTRAVENOUS
  Administered 2018-05-14: 4000 [IU] via INTRAVENOUS

## 2018-05-14 MED ORDER — CARVEDILOL 3.125 MG PO TABS
3.1250 mg | ORAL_TABLET | Freq: Two times a day (BID) | ORAL | Status: DC
  Administered 2018-05-14 – 2018-05-15 (×2): 3.125 mg via ORAL
  Filled 2018-05-14 (×2): qty 1

## 2018-05-14 MED ORDER — ENALAPRIL MALEATE 2.5 MG PO TABS
2.5000 mg | ORAL_TABLET | Freq: Every day | ORAL | Status: DC
  Administered 2018-05-15: 2.5 mg via ORAL
  Filled 2018-05-14: qty 1

## 2018-05-14 MED ORDER — ASPIRIN 81 MG PO CHEW: 81.0000 mg | CHEWABLE_TABLET | ORAL | Status: DC

## 2018-05-14 MED ORDER — MAGNESIUM OXIDE 400 (241.3 MG) MG PO TABS
400.0000 mg | ORAL_TABLET | Freq: Every day | ORAL | Status: DC
  Administered 2018-05-15: 400 mg via ORAL
  Filled 2018-05-14: qty 1

## 2018-05-14 MED ORDER — LABETALOL HCL 5 MG/ML IV SOLN: 10.0000 mg | INTRAVENOUS | Status: AC | PRN

## 2018-05-14 MED ORDER — FENTANYL CITRATE (PF) 100 MCG/2ML IJ SOLN
INTRAMUSCULAR | Status: AC
  Filled 2018-05-14: qty 2

## 2018-05-14 MED ORDER — VERAPAMIL HCL 2.5 MG/ML IV SOLN
INTRAVENOUS | Status: DC | PRN
  Administered 2018-05-14 (×2): 2.5 mg via INTRA_ARTERIAL

## 2018-05-14 MED ORDER — CLOPIDOGREL BISULFATE 75 MG PO TABS
ORAL_TABLET | ORAL | Status: DC | PRN
  Administered 2018-05-14: 300 mg via ORAL

## 2018-05-14 MED ORDER — MELATONIN 5 MG PO TABS
10.0000 mg | ORAL_TABLET | Freq: Every evening | ORAL | Status: DC | PRN
  Administered 2018-05-14: 10 mg via ORAL
  Filled 2018-05-14 (×2): qty 2

## 2018-05-14 MED ORDER — DULOXETINE HCL 30 MG PO CPEP
60.0000 mg | ORAL_CAPSULE | Freq: Every day | ORAL | Status: DC
  Administered 2018-05-15: 60 mg via ORAL
  Filled 2018-05-14: qty 2

## 2018-05-14 MED ORDER — ENOXAPARIN SODIUM 40 MG/0.4ML ~~LOC~~ SOLN
40.0000 mg | SUBCUTANEOUS | Status: DC
  Administered 2018-05-15: 40 mg via SUBCUTANEOUS
  Filled 2018-05-14 (×2): qty 0.4

## 2018-05-14 MED ORDER — SODIUM CHLORIDE 0.9 % IV SOLN
INTRAVENOUS | Status: DC
  Administered 2018-05-14: 08:00:00 via INTRAVENOUS

## 2018-05-14 MED ORDER — CLOPIDOGREL BISULFATE 75 MG PO TABS
75.0000 mg | ORAL_TABLET | Freq: Every day | ORAL | Status: DC
  Administered 2018-05-15: 75 mg via ORAL
  Filled 2018-05-14: qty 1

## 2018-05-14 MED ORDER — ALBUTEROL SULFATE (2.5 MG/3ML) 0.083% IN NEBU: 2.5000 mg | INHALATION_SOLUTION | Freq: Four times a day (QID) | RESPIRATORY_TRACT | Status: DC | PRN

## 2018-05-14 MED ORDER — MONTELUKAST SODIUM 10 MG PO TABS
10.0000 mg | ORAL_TABLET | Freq: Every day | ORAL | Status: DC
  Administered 2018-05-14: 10 mg via ORAL
  Filled 2018-05-14: qty 1

## 2018-05-14 MED ORDER — SPIRONOLACTONE 25 MG PO TABS
12.5000 mg | ORAL_TABLET | Freq: Every day | ORAL | Status: DC
  Administered 2018-05-15: 12.5 mg via ORAL
  Filled 2018-05-14: qty 0.5
  Filled 2018-05-14: qty 1

## 2018-05-14 MED ORDER — ASPIRIN EC 81 MG PO TBEC
81.0000 mg | DELAYED_RELEASE_TABLET | Freq: Every day | ORAL | Status: DC
  Administered 2018-05-15: 81 mg via ORAL
  Filled 2018-05-14: qty 1

## 2018-05-14 MED ORDER — PANTOPRAZOLE SODIUM 40 MG PO TBEC
40.0000 mg | DELAYED_RELEASE_TABLET | Freq: Every day | ORAL | Status: DC
  Administered 2018-05-15: 40 mg via ORAL
  Filled 2018-05-14: qty 1

## 2018-05-14 MED ORDER — SODIUM CHLORIDE 0.9% FLUSH: 3.0000 mL | Freq: Two times a day (BID) | INTRAVENOUS | Status: DC

## 2018-05-14 MED ORDER — LETROZOLE 2.5 MG PO TABS
2.5000 mg | ORAL_TABLET | Freq: Every day | ORAL | Status: DC
  Administered 2018-05-15: 2.5 mg via ORAL
  Filled 2018-05-14: qty 1

## 2018-05-14 MED ORDER — MIDAZOLAM HCL 2 MG/2ML IJ SOLN
INTRAMUSCULAR | Status: DC | PRN
  Administered 2018-05-14: 1 mg via INTRAVENOUS

## 2018-05-14 MED ORDER — MIDAZOLAM HCL 2 MG/2ML IJ SOLN
INTRAMUSCULAR | Status: AC
  Filled 2018-05-14: qty 2

## 2018-05-14 MED ORDER — SODIUM CHLORIDE 0.9 % WEIGHT BASED INFUSION: 3.0000 mL/kg/h | INTRAVENOUS | Status: DC

## 2018-05-14 MED ORDER — NITROGLYCERIN 1 MG/10 ML FOR IR/CATH LAB
INTRA_ARTERIAL | Status: DC | PRN
  Administered 2018-05-14 (×2): 200 ug via INTRACORONARY

## 2018-05-14 MED ORDER — ACETAMINOPHEN 325 MG PO TABS: 650.0000 mg | ORAL_TABLET | ORAL | Status: DC | PRN

## 2018-05-14 MED ORDER — HYDRALAZINE HCL 20 MG/ML IJ SOLN: 5.0000 mg | INTRAMUSCULAR | Status: AC | PRN

## 2018-05-14 MED ORDER — HEPARIN SODIUM (PORCINE) 1000 UNIT/ML IJ SOLN
INTRAMUSCULAR | Status: AC
  Filled 2018-05-14: qty 1

## 2018-05-14 MED ORDER — CLOPIDOGREL BISULFATE 75 MG PO TABS: 75.0000 mg | ORAL_TABLET | Freq: Every day | ORAL | Status: DC

## 2018-05-14 MED ORDER — FENTANYL CITRATE (PF) 100 MCG/2ML IJ SOLN
INTRAMUSCULAR | Status: DC | PRN
  Administered 2018-05-14 (×2): 25 ug via INTRAVENOUS

## 2018-05-14 MED ORDER — MOMETASONE FURO-FORMOTEROL FUM 200-5 MCG/ACT IN AERO
2.0000 | INHALATION_SPRAY | Freq: Two times a day (BID) | RESPIRATORY_TRACT | Status: DC
  Administered 2018-05-14 – 2018-05-15 (×2): 2 via RESPIRATORY_TRACT
  Filled 2018-05-14: qty 8.8

## 2018-05-14 MED ORDER — SODIUM CHLORIDE 0.9 % WEIGHT BASED INFUSION: 1.0000 mL/kg/h | INTRAVENOUS | Status: DC

## 2018-05-14 MED ORDER — SODIUM CHLORIDE 0.9% FLUSH
3.0000 mL | Freq: Two times a day (BID) | INTRAVENOUS | Status: DC
  Administered 2018-05-14 – 2018-05-15 (×2): 3 mL via INTRAVENOUS

## 2018-05-14 MED ORDER — ROSUVASTATIN CALCIUM 10 MG PO TABS
20.0000 mg | ORAL_TABLET | Freq: Every day | ORAL | Status: DC
  Administered 2018-05-14: 20 mg via ORAL
  Filled 2018-05-14: qty 2

## 2018-05-14 SURGICAL SUPPLY — 16 items
BALLN TREK RX 2.25X12 (BALLOONS) ×4
BALLN ~~LOC~~ TREK RX 2.5X12 (BALLOONS) ×4
BALLOON TREK RX 2.25X12 (BALLOONS) ×2 IMPLANT
BALLOON ~~LOC~~ TREK RX 2.5X12 (BALLOONS) ×2 IMPLANT
CATH INFINITI JR4 5F (CATHETERS) ×4 IMPLANT
CATH VISTA GUIDE 6FR XB3.5 (CATHETERS) ×4 IMPLANT
DEVICE INFLAT 30 PLUS (MISCELLANEOUS) ×4 IMPLANT
DEVICE RAD TR BAND REGULAR (VASCULAR PRODUCTS) ×4 IMPLANT
GLIDESHEATH SLEND SS 6F .021 (SHEATH) ×4 IMPLANT
KIT MANI 3VAL PERCEP (MISCELLANEOUS) ×4 IMPLANT
PACK CARDIAC CATH (CUSTOM PROCEDURE TRAY) ×4 IMPLANT
STENT RESOLUTE ONYX 2.25X15 (Permanent Stent) ×4 IMPLANT
WIRE ASAHI PROWATER 180CM (WIRE) ×4 IMPLANT
WIRE HITORQ VERSACORE ST 145CM (WIRE) ×4 IMPLANT
WIRE ROSEN-J .035X260CM (WIRE) ×4 IMPLANT
WIRE RUNTHROUGH .014X180CM (WIRE) ×4 IMPLANT

## 2018-05-14 NOTE — Interval H&P Note (Signed)
History and Physical Interval Note:  05/14/2018 7:24 AM  Krista Cisneros  has presented today for cardiac catheterization, with the diagnosis of chronic systolic heart failure, chest pain, and recent NSTEMI.  The various methods of treatment have been discussed with the patient and family. After consideration of risks, benefits and other options for treatment, the patient has consented to  Procedure(s): LEFT HEART CATH AND CORONARY ANGIOGRAPHY (Left) as a surgical intervention .  The patient's history has been reviewed, patient examined, no change in status, stable for surgery.  I have reviewed the patient's chart and labs.  Questions were answered to the patient's satisfaction.    Cath Lab Visit (complete for each Cath Lab visit)  Clinical Evaluation Leading to the Procedure:   ACS: No.  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

## 2018-05-15 ENCOUNTER — Encounter: Payer: Self-pay | Admitting: Internal Medicine

## 2018-05-15 ENCOUNTER — Telehealth: Payer: Self-pay | Admitting: Cardiovascular Disease

## 2018-05-15 ENCOUNTER — Other Ambulatory Visit: Payer: Self-pay | Admitting: Physician Assistant

## 2018-05-15 DIAGNOSIS — I2510 Atherosclerotic heart disease of native coronary artery without angina pectoris: Secondary | ICD-10-CM

## 2018-05-15 DIAGNOSIS — Z9861 Coronary angioplasty status: Secondary | ICD-10-CM | POA: Diagnosis not present

## 2018-05-15 DIAGNOSIS — I110 Hypertensive heart disease with heart failure: Secondary | ICD-10-CM | POA: Diagnosis not present

## 2018-05-15 DIAGNOSIS — I25119 Atherosclerotic heart disease of native coronary artery with unspecified angina pectoris: Secondary | ICD-10-CM | POA: Diagnosis not present

## 2018-05-15 DIAGNOSIS — E782 Mixed hyperlipidemia: Secondary | ICD-10-CM

## 2018-05-15 LAB — CBC
HCT: 30.2 % — ABNORMAL LOW (ref 36.0–46.0)
Hemoglobin: 8.9 g/dL — ABNORMAL LOW (ref 12.0–15.0)
MCH: 24.8 pg — ABNORMAL LOW (ref 26.0–34.0)
MCHC: 29.5 g/dL — ABNORMAL LOW (ref 30.0–36.0)
MCV: 84.1 fL (ref 80.0–100.0)
NRBC: 0 % (ref 0.0–0.2)
Platelets: 331 10*3/uL (ref 150–400)
RBC: 3.59 MIL/uL — AB (ref 3.87–5.11)
RDW: 16.7 % — ABNORMAL HIGH (ref 11.5–15.5)
WBC: 8.7 10*3/uL (ref 4.0–10.5)

## 2018-05-15 LAB — BASIC METABOLIC PANEL
Anion gap: 5 (ref 5–15)
BUN: 18 mg/dL (ref 8–23)
CO2: 26 mmol/L (ref 22–32)
Calcium: 8.4 mg/dL — ABNORMAL LOW (ref 8.9–10.3)
Chloride: 108 mmol/L (ref 98–111)
Creatinine, Ser: 1.24 mg/dL — ABNORMAL HIGH (ref 0.44–1.00)
GFR calc non Af Amer: 42 mL/min — ABNORMAL LOW (ref >60)
GFR, EST AFRICAN AMERICAN: 49 mL/min — AB (ref >60)
Glucose, Bld: 102 mg/dL — ABNORMAL HIGH (ref 70–99)
Potassium: 3.6 mmol/L (ref 3.5–5.1)
SODIUM: 139 mmol/L (ref 135–145)

## 2018-05-15 MED ORDER — SODIUM CHLORIDE 0.9% FLUSH
10.0000 mL | INTRAVENOUS | Status: DC | PRN
  Administered 2018-05-15: 10 mL
  Filled 2018-05-15: qty 40

## 2018-05-15 MED ORDER — PANTOPRAZOLE SODIUM 40 MG PO TBEC: 40.0000 mg | DELAYED_RELEASE_TABLET | Freq: Every day | ORAL | 3 refills | Status: DC

## 2018-05-15 MED ORDER — ENALAPRIL MALEATE 2.5 MG PO TABS: 2.5000 mg | ORAL_TABLET | Freq: Every day | ORAL | 4 refills | Status: DC

## 2018-05-15 MED ORDER — ROSUVASTATIN CALCIUM 20 MG PO TABS: 20.0000 mg | ORAL_TABLET | Freq: Every day | ORAL | 4 refills | Status: DC

## 2018-05-15 MED ORDER — CARVEDILOL 3.125 MG PO TABS: 3.1250 mg | ORAL_TABLET | Freq: Two times a day (BID) | ORAL | 4 refills | Status: DC

## 2018-05-15 MED ORDER — HEPARIN SOD (PORK) LOCK FLUSH 100 UNIT/ML IV SOLN
500.0000 [IU] | Freq: Once | INTRAVENOUS | Status: AC
  Filled 2018-05-15: qty 5

## 2018-05-15 MED ORDER — SPIRONOLACTONE 25 MG PO TABS: 12.5000 mg | ORAL_TABLET | Freq: Every day | ORAL | 3 refills | Status: DC

## 2018-05-15 MED ORDER — SODIUM CHLORIDE 0.9% FLUSH
10.0000 mL | Freq: Two times a day (BID) | INTRAVENOUS | Status: DC
  Administered 2018-05-15: 10 mL

## 2018-05-15 MED ORDER — HEPARIN SOD (PORK) LOCK FLUSH 100 UNIT/ML IV SOLN
500.0000 [IU] | INTRAVENOUS | Status: AC | PRN
  Administered 2018-05-15: 500 [IU]

## 2018-05-15 MED ORDER — CLOPIDOGREL BISULFATE 75 MG PO TABS: 75.0000 mg | ORAL_TABLET | Freq: Every day | ORAL | 4 refills | Status: DC

## 2018-05-15 NOTE — Telephone Encounter (Signed)
Patient currently admitted- TCM call 1/23.

## 2018-05-15 NOTE — Discharge Instructions (Signed)
Radial Site Care ° °This sheet gives you information about how to care for yourself after your procedure. Your health care provider may also give you more specific instructions. If you have problems or questions, contact your health care provider. °What can I expect after the procedure? °After the procedure, it is common to have: °· Bruising and tenderness at the catheter insertion area. °Follow these instructions at home: °Medicines °· Take over-the-counter and prescription medicines only as told by your health care provider. °Insertion site care °· Follow instructions from your health care provider about how to take care of your insertion site. Make sure you: °? Wash your hands with soap and water before you change your bandage (dressing). If soap and water are not available, use hand sanitizer. °? Change your dressing as told by your health care provider. °? Leave stitches (sutures), skin glue, or adhesive strips in place. These skin closures may need to stay in place for 2 weeks or longer. If adhesive strip edges start to loosen and curl up, you may trim the loose edges. Do not remove adhesive strips completely unless your health care provider tells you to do that. °· Check your insertion site every day for signs of infection. Check for: °? Redness, swelling, or pain. °? Fluid or blood. °? Pus or a bad smell. °? Warmth. °· Do not take baths, swim, or use a hot tub until your health care provider approves. °· You may shower 24-48 hours after the procedure, or as directed by your health care provider. °? Remove the dressing and gently wash the site with plain soap and water. °? Pat the area dry with a clean towel. °? Do not rub the site. That could cause bleeding. °· Do not apply powder or lotion to the site. °Activity ° °· For 24 hours after the procedure, or as directed by your health care provider: °? Do not flex or bend the affected arm. °? Do not push or pull heavy objects with the affected arm. °? Do not  drive yourself home from the hospital or clinic. You may drive 24 hours after the procedure unless your health care provider tells you not to. °? Do not operate machinery or power tools. °· Do not lift anything that is heavier than 10 lb (4.5 kg), or the limit that you are told, until your health care provider says that it is safe. °· Ask your health care provider when it is okay to: °? Return to work or school. °? Resume usual physical activities or sports. °? Resume sexual activity. °General instructions °· If the catheter site starts to bleed, raise your arm and put firm pressure on the site. If the bleeding does not stop, get help right away. This is a medical emergency. °· If you went home on the same day as your procedure, a responsible adult should be with you for the first 24 hours after you arrive home. °· Keep all follow-up visits as told by your health care provider. This is important. °Contact a health care provider if: °· You have a fever. °· You have redness, swelling, or yellow drainage around your insertion site. °Get help right away if: °· You have unusual pain at the radial site. °· The catheter insertion area swells very fast. °· The insertion area is bleeding, and the bleeding does not stop when you hold steady pressure on the area. °· Your arm or hand becomes pale, cool, tingly, or numb. °These symptoms may represent a serious problem   that is an emergency. Do not wait to see if the symptoms will go away. Get medical help right away. Call your local emergency services (911 in the [place] Do not drive yourself to the hospital. Summary  After the procedure, it is common to have bruising and tenderness at the site.  Follow instructions from your health care provider about how to take care of your radial site wound. Check the wound every day for signs of infection.  Do not lift anything that is heavier than 10 lb (4.5 kg), or the limit that you are told, until your health care provider says  that it is safe. This information is not intended to replace advice given to you by your health care provider. Make sure you discuss any questions you have with your health care provider. Document Released: 05/13/2010 Document Revised: 05/16/2017 Document Reviewed: 05/16/2017 Elsevier Interactive Patient Education  2019 [place]. Moderate Conscious Sedation, Adult, Care After These instructions provide you with information about caring for yourself after your procedure. Your health care provider may also give you more specific instructions. Your treatment has been planned according to current medical practices, but problems sometimes occur. Call your health care provider if you have any problems or questions after your procedure. What can I expect after the procedure? After your procedure, it is common:  To feel sleepy for several hours.  To feel clumsy and have poor balance for several hours.  To have poor judgment for several hours.  To vomit if you eat too soon. Follow these instructions at home: For at least 24 hours after the procedure:   Do not: ? Participate in activities where you could fall or become injured. ? Drive. ? Use heavy machinery. ? Drink alcohol. ? Take sleeping pills or medicines that cause drowsiness. ? Make important decisions or sign legal documents. ? Take care of children on your own.  Rest. Eating and drinking  Follow the diet recommended by your health care provider.  If you vomit: ? Drink water, juice, or soup when you can drink without vomiting. ? Make sure you have little or no nausea before eating solid foods. General instructions  Have a responsible adult stay with you until you are awake and alert.  Take over-the-counter and prescription medicines only as told by your health care provider.  If you smoke, do not smoke without supervision.  Keep all follow-up visits as told by your health care provider. This is important. Contact a  health care provider if:  You keep feeling nauseous or you keep vomiting.  You feel light-headed.  You develop a rash.  You have a fever. Get help right away if:  You have trouble breathing. This information is not intended to replace advice given to you by your health care provider. Make sure you discuss any questions you have with your health care provider. Document Released: 01/29/2013 Document Revised: 09/13/2015 Document Reviewed: 07/31/2015 Elsevier Interactive Patient Education  2019 [place] have received care from [facility] during this hospital stay and we look forward to continuing to provide you with excellent care in our office settings after you've left the hospital.  In order to assure a smoother transition to home following your discharge from the hospital, we will likely have you see one of our nurse practitioners or physician assistants within a few weeks of discharge.  Our advanced practice providers work closely with your physician in order to address all of your  heart's needs in a timely manner.  More information about all of our providers may be found here: [email or web address]  Please plan to bring all of your prescriptions to your follow-up appointment and don't hesitate to contact us with questions or concerns.  [place]     [facility] wants to help you get well and stay well.  Live a longer, healthier life by practicing healthy habits every day.  1.  Visit your primary care provider regularly. 2.  Make time for family and friends.  Healthy relationships are important. 3.  Take medications as directed by your provider. 4.  Maintain a healthy weight and a trim waistline. 5.  Eat healthy meals and snacks, rich in fruits, vegetables, whole grains, and lean proteins. 6.  Get moving every day - aim  for 150 minutes of moderate physical activity each week. 7.  Don't smoke. 8.  Avoid alcohol or drink in moderation. 9.  Manage stress through meditation or mindful relaxation. 10.  Get seven to nine hours of quality sleep each night.  Want more information on healthy habits?  To learn more about these and other healthy habits, visit [employer].[email or web address]. _____________   NO HEAVY LIFTING X 4 WEEKS. NO SEXUAL ACTIVITY X 4 WEEKS. NO SOAKING BATHS, HOT TUBS, POOLS, ETC., X 7 DAYS.  **PLEASE REMEMBER TO BRING ALL OF YOUR MEDICATIONS TO EACH OF YOUR FOLLOW-UP OFFICE VISITS.

## 2018-05-15 NOTE — Progress Notes (Signed)
Progress Note  Patient Name: Krista Cisneros Date of Encounter: 05/15/2018  Primary Cardiologist: End, [facility]  Subjective   Feels well overnight, denies any chest pain Large region of ecchymoses/bruising left radial access site, no significant pain Reports that she has ambulated, feels ready to go home  Inpatient Medications    Scheduled Meds: . aspirin EC  81 mg Oral Daily  . baclofen  10 mg Oral QHS  . carvedilol  3.125 mg Oral BID WC  . clopidogrel  75 mg Oral Q breakfast  . DULoxetine  60 mg Oral Daily  . enalapril  2.5 mg Oral Daily  . enoxaparin (LOVENOX) injection  40 mg Subcutaneous Q24H  . letrozole  2.5 mg Oral Daily  . magnesium oxide  400 mg Oral Daily  . mometasone-formoterol  2 puff Inhalation BID  . montelukast  10 mg Oral QHS  . pantoprazole  40 mg Oral Daily  . rosuvastatin  20 mg Oral q1800  . sodium chloride flush  10-40 mL Intracatheter Q12H  . sodium chloride flush  3 mL Intravenous Q12H  . spironolactone  12.5 mg Oral Daily   Continuous Infusions: . sodium chloride     PRN Meds: sodium chloride, acetaminophen, albuterol, Melatonin, ondansetron (ZOFRAN) IV, sodium chloride flush, sodium chloride flush   Vital Signs    Vitals:   05/14/18 1940 05/15/18 0326 05/15/18 0420 05/15/18 0734  BP: (!) 151/69  (!) 174/62 (!) 178/83  Pulse: 75  69 74  Resp: 16  20 19   Temp: 99.7 F (37.6 C)  98.7 F (37.1 C) 98.4 F (36.9 C)  TempSrc: Oral  Oral Oral  SpO2: 94%  95% 96%  Weight:  65.3 kg    Height:        Intake/Output Summary (Last 24 hours) at 05/15/2018 1114 Last data filed at 05/15/2018 0954 Gross per 24 hour  Intake 363 ml  Output 1725 ml  Net -1362 ml   Last 3 Weights 05/15/2018 05/14/2018 05/14/2018  Weight (lbs) 143 lb 14.4 oz 144 lb 150 lb 9.2 oz  Weight (kg) 65.273 kg 65.318 kg 68.3 kg      Telemetry    NSR - Personally Reviewed  ECG    - Personally Reviewed  Physical Exam   Constitutional:  oriented to person, place,  and time. No distress.  HENT:  Head: Grossly normal Eyes:  no discharge. No scleral icterus.  Neck: No JVD, no carotid bruits  Cardiovascular: Regular rate and rhythm, no murmurs appreciated Pulmonary/Chest: Clear to auscultation bilaterally, no wheezes or rails Abdominal: Soft.  no distension.  no tenderness.  Musculoskeletal: Normal range of motion Neurological:  normal muscle tone. Coordination normal. No atrophy Skin: Skin warm and dry Large region of ecchymosis left radial access site Psychiatric: normal affect, pleasant  Labs    Chemistry Recent Labs  Lab 05/09/18 1230 05/14/18 1547 05/15/18 0621  NA 142  --  139  K 4.8  --  3.6  CL 111  --  108  CO2 27  --  26  GLUCOSE 142*  --  102*  BUN 21  --  18  CREATININE 1.10* 1.20* 1.24*  CALCIUM 9.1  --  8.4*  GFRNONAA 49* 44* 42*  GFRAA 56* 51* 49*  ANIONGAP 4*  --  5     Hematology Recent Labs  Lab 05/09/18 1230 05/14/18 1547 05/15/18 0621  WBC 9.3 8.3 8.7  RBC 3.95 3.61* 3.59*  HGB 9.9* 9.0* 8.9*  HCT 34.6* 30.5*  30.2*  MCV 87.6 84.5 84.1  MCH 25.1* 24.9* 24.8*  MCHC 28.6* 29.5* 29.5*  RDW 17.1* 16.9* 16.7*  PLT 359 304 331    Cardiac EnzymesNo results for input(s): TROPONINI in the last 168 hours. No results for input(s): TROPIPOC in the last 168 hours.   BNPNo results for input(s): BNP, PROBNP in the last 168 hours.   DDimer No results for input(s): DDIMER in the last 168 hours.   Radiology    No results found.  Cardiac Studies   Cardiac catheterization May 14, 2018 Conclusions: 1. 90% mid LCx stenosis, unchanged from catheterization last month. 2. Widely patent proximal and mid LAD stents. 3. Successful PCI to mid LCx using Resolute Onyx 2.25 x 15 mm drug-eluting stent (postdilated to 2.6 mm) with 0% residual stenosis and TIMI-3 flow.  Recommendations: 1. Overnight extended recovery with gentle post catheterization hydration, given chronic kidney disease stage III and chronic systolic  heart failure. 2. Dual antiplatelet therapy with aspirin and clopidogrel for at least 12 months from the time of NSTEMI and PCI last month. 3. Aggressive secondary prevention and evidence-based heart failure therapy.  Patient Profile     Krista Cisneros is a 78 y.o. female with history of CAD with recent NSTEMI in mid 03/2018 s/p PCI as below, chronic systolic CHF secondary to ICM, COPD secondary to prior tobacco abuse quitting in 1990, HTN, HLD, breast cancer s/p chemoradiation, and GERD admission to [facility] from 04/09/18 to 04/12/18 for NSTEMI, successful PCI/DES x 2 to the LAD at that time, cardiac catheterization yesterday with stent to the left circumflex  Assessment & Plan    1) coronary artery disease with stable angina  admission to [facility] from 04/09/18 to 04/12/18 for NSTEMI,  successful PCI/DES x 2 to the LAD  cardiac catheterization yesterday with stent to the left circumflex Feeling well with no complaints, ecchymosis right radial Continue aspirin, Plavix beta-blocker, ACE inhibitor statin, Aldactone  2) ischemic cardiomyopathy Ejection fraction of 30 to 35% by echocardiogram April 10, 2018 Would recommend continue medications as detailed above Outpatient echocardiogram at a later date for reevaluation of ejection fraction following revascularization and medical management  3) essential hypertension Blood pressure stable on current medications  Hyperlipidemia Continue Crestor Notes indicating intolerance to Lipitor and Zetia   Total encounter time more than 25 minutes  Greater than 50% was spent in counseling and coordination of care with the patient    For questions or updates, please contact [facility] Please consult [email or web address] for contact info under        Signed, Ida Rogue, MD  05/15/2018, 11:14 AM

## 2018-05-15 NOTE — Progress Notes (Signed)
Cardiovascular and Pulmonary Nurse Navigator Note:    78 year old female who presented yesterday for Cardiac Catheterization staged PCI of left circumflex with hx of chronic heart failure, chest pain, and recent NSTEMI (03/2018).    Krista Cisneros  CARDIAC CATHETERIZATION  Order# [id number]  Reading physician: Nelva Bush, MD Ordering physician: Sindy Messing Study date: 05/14/18  Physicians   Panel Physicians Referring Physician Case Authorizing Physician  End, Harrell Gave, MD (Primary) Sharyne Peach, MD Rise Mu, PA-C  Procedures   CORONARY STENT INTERVENTION  Conclusion   Conclusions: 1. 90% mid LCx stenosis, unchanged from catheterization last month. 2. Widely patent proximal and mid LAD stents. 3. Successful PCI to mid LCx using Resolute Onyx 2.25 x 15 mm drug-eluting stent (postdilated to 2.6 mm) with 0% residual stenosis and TIMI-3 flow.  Recommendations: 1. Overnight extended recovery with gentle post catheterization hydration, given chronic kidney disease stage III and chronic systolic heart failure. 2. Dual antiplatelet therapy with aspirin and clopidogrel for at least 12 months from the time of NSTEMI and PCI last month. 3. Aggressive secondary prevention and evidence-based heart failure therapy.  Nelva Bush, MD [facility] Pager: [phone]   Rounded on patient to discuss Cardiac Rehab with patient.  Explained to patient she had been referred to outpatient Cardiac Rehab at [facility].  Overview of the program provided. Benefits of exercise and heart healthy lifestyle discussed.  Patient has not been exercising this past year as patient described it as a difficult year for her.  Patient had treatment for breast cancer, had physical therapy, and NSTEMI with PCI in December and now staged PCI yesterday.    Brochure, informational letter, class and orientation times, and CPT billing codes given to patient. Patient is interested in participating.  Patient plans to check with her insurance company to see what her out-of-pocket expenses will be. Patient is agreeable to the Cardiac Rehab dept contacting her within one week of discharge to schedule her first appointment.    Note:  Smoking Cessation - Patient is a former smoker. ? [facility] [employer] [facility] - Explanation of support group given.  Brochure provided.  Invited patient and encouraged patient to attend.  Next meeting 06/04/2018 at 11:30 a.m.    Patient appreciative of the information.   ? Roanna Epley, RN, BSN, [place]  [facility] Cardiac & Pulmonary Rehab  Cardiovascular & Pulmonary Nurse Navigator  Direct Line: [phone]  Department Phone #: [phone] Fax: [phone]  Email Address: [email or web address]

## 2018-05-15 NOTE — Telephone Encounter (Signed)
TCM....  Patient is being discharged  Today s/p cath      They are scheduled to see Gollan on 2/10 at 320 pm     They need to be seen within 2 wks

## 2018-05-15 NOTE — Discharge Summary (Signed)
Discharge Summary    Patient ID: Edan Serratore MRN: [id number]; DOB: 1940-10-02  Admit date: 05/14/2018 Discharge date: 05/15/2018  Primary Care Provider: Sharyne Peach, MD  Primary Cardiologist: Dr. Rockey Situ Primary Electrophysiologist:  None   Discharge Diagnoses    Principal Problem:   Non-STEMI (non-ST elevated myocardial infarction) [facility]) Active Problems:   Coronary artery disease involving native coronary artery of native heart without angina pectoris   Coronary artery disease involving native coronary artery of native heart with angina pectoris ([place])   Chronic systolic heart failure (HCC)   Allergies Allergies  Allergen Reactions  . Nsaids     Bleeding risk  . Penicillins Anaphylaxis, Swelling and Rash    Has patient had a PCN reaction causing immediate rash, facial/tongue/throat swelling, SOB or lightheadedness with hypotension: Yes Has patient had a PCN reaction causing severe rash involving mucus membranes or skin necrosis: No Has patient had a PCN reaction that required hospitalization: No  Has patient had a PCN reaction occurring within the last 10 years: No If all of the above answers are "NO", then may proceed with Cephalosporin use.   . Atorvastatin Other (See Comments)    Muscle Pain  . Gabapentin Swelling  . Ezetimibe Other (See Comments)    Muscle pain  . Aspirin     GI bleeding risk    Diagnostic Studies/Procedures    Coronary Stent Intervention Staged PCI Coronary Findings  Diagnostic  Dominance: Right  Left Anterior Descending  Prox LAD to Mid LAD lesion 0% stenosed  Previously placed Prox LAD to Mid LAD drug eluting stent is widely patent. The lesion is type C, eccentric and thrombotic. The lesion is moderately calcified.  Mid LAD lesion 0% stenosed  Previously placed Mid LAD drug eluting stent is widely patent. The lesion is not complex (non high-C).  Left Circumflex  Mid Cx lesion 90% stenosed  Mid Cx lesion is 90% stenosed.    Second Obtuse Marginal Branch  Ost 2nd Mrg to 2nd Mrg lesion 40% stenosed  Ost 2nd Mrg to 2nd Mrg lesion is 40% stenosed.  Intervention   Mid Cx lesion  Stent  Lesion length: 12 mm. CATH VISTA GUIDE 6FR XB3.5 guide catheter was inserted. Lesion crossed with guidewire using a WIRE RUNTHROUGH .[id number]. Pre-stent angioplasty was performed using a BALLOON TREK RX [id number]. Maximum pressure: 8 atm. A drug-eluting stent was successfully placed using a [place] 2.25X15. Maximum pressure: 12 atm. Inflation time: 30 sec. Stent strut is well apposed. Post-stent angioplasty was performed using a BALLOON [place] TREK RX 2.5X12. Maximum pressure: 16 atm.  Post-Intervention Lesion Assessment  The intervention was successful. Pre-interventional TIMI flow is 3. Post-intervention TIMI flow is 3. No complications occurred at this lesion.  There is a 0% residual stenosis post intervention.  Coronary Diagrams   Diagnostic  Dominance: Right    Intervention      Conclusions: 1. 90% mid LCx stenosis, unchanged from catheterization last month. 2. Widely patent proximal and mid LAD stents. 3. Successful PCI to mid LCx using Resolute Onyx 2.25 x 15 mm drug-eluting stent (postdilated to 2.6 mm) with 0% residual stenosis and TIMI-3 flow.  Recommendations: 1. Overnight extended recovery with gentle post catheterization hydration, given chronic kidney disease stage III and chronic systolic heart failure. 2. Dual antiplatelet therapy with aspirin and clopidogrel for at least 12 months from the time of NSTEMI and PCI last month. 3. Aggressive secondary prevention and evidence-based heart failure therapy.  Nelva Bush, MD [facility] Pager: [phone]  [phone]  _____________   History of Present Illness     Krista Cisneros is a 78 y.o. female with history of CAD with recent NSTEMI in mid 03/2018 s/p PCI, chronic systolic CHF secondary to ICM, COPD secondary to prior tobacco abuse quitting in  1990, HTN, HLD, breast cancer s/p chemoradiation, and GERD.   Admitted to [facility] from 04/09/18 to 04/12/18 for NSTEMI with no known cardiac history at that time and with c/o chest pain and increasing fatigue. Initial troponin 2.71 and peaked at 18.30. EKG showed sinus rhythm with anterolateral st/t changes concerning for ischemia. CTA chest/abdomen/pelvis was negative for aortic dissection or PE with incidental 6 mm pulmonary nodule noted in the left lower lobe. Echo 04/10/2018 showed an EF of 30-35%, anteroseptal, anterior, and apical hypokinesis, Gr1DD, mild to moderate mitral regurgitation, mildly dilated left atrium, RVSF was normal. LHC on 04/10/2018 showed proximal LAD 95% stenosis, mid LAD 80% stenosis, mid to distal LCx 95% stenosis, OM3-1 lesion 40% stenosis, OM3-2 lesion 60% stenosis, mid RCA 50% stenosis. EF 35-45%. She underwent successful PCI/DES x 2 to the LAD with recommended staged PCI of the LCx in a few weeks. Discharge weight 65.6 kg.  She was seen as an outpatient 04/22/18 after discharge and denied symptoms of chest pain or shortness of breath.  Her energy level continue to improve on a daily basis.  She denied falls since discharge, as well as denying BRBPR or melena.  She reported medication compliance and intended on buying a scale to help with managing her volume status.  Following her staged PCI, she indicated she would like to go back to water aerobics.  EKG at the time of outpatient follow-up showed NSR, 75 bpm, low voltage QRS, T wave inversion along leads I, aVL, V3 through V6 and similar to post PCI EKG on 03/2018.  Recommendations were to continue antiplatelet therapy with ASA and Plavix and complete repeat cardiac catheterization for staged PCI of left circumflex 05/14/2018.    Hospital Course     Consultants: none  On 04/24/2018, the patient presented to [facility] for patient scheduled cardiac catheterization and staged PCI of left circumflex with history of chronic heart failure,  chest pain, and recent end STEMI (03/2018).  She underwent staged PCI by Dr. Harrell Gave End with noted 90% mid left circumflex stenosis, unchanged from cardiac catheterization the previous month.  Widely patent proximal and mid LAD stents were also noted.  Successful PCI was performed to mid left circumflex with 0% residual stenosis and TIMI-3 flow. Post PCI recommendations were made for overnight extended recovery with gentle postcatheterization hydration, given chronic kidney disease stage III and chronic systolic heart failure.  She was also recommended to continue DAPT with ASA and clopidogrel for at least 12 months from the time of NSTEMI and PCI last month.  Today 05/15/2018, the patient was examined by MD and reported she feels well and denies any cardiac symptoms including chest pain, palpitations, and racing heart rate.  Ecchymosis was noted on physical exam of the left radial entry site but without patient reporting pain.  She successfully ambulated earlier same day. She has been examined by the physician with labs reviewed and deemed stable and ready for discharge.  She was scheduled for TCM follow-up at [facility] with Dr. Rockey Situ 06/03/2018 at 3:20PM.  She was also referred to outpatient cardiac rehab at [facility].  She will continue ASA, Plavix, beta-blocker, ACE inhibitor, and Aldactone for CAD with stable angina.   Crestor was  continued for hyperlipidemia with noted intolerance to both Lipitor in [place]. Protonix was started in place of Prilosec d/t risk of interaction with Plavix. It was recommended she continue her medical management of ICM with ejection fraction of 30-35% by echocardiogram April 10, 2018.    It was recommended that she be scheduled and complete outpatient echocardiogram at a later date for reevaluation of her ejection fraction following vascularization and medical management. _____________  Physical Exam   Constitutional:  oriented to person, place, and time.  No distress.  HENT:  Head: Grossly normal Eyes:  no discharge. No scleral icterus.  Neck: No JVD, no carotid bruits  Cardiovascular: Regular rate and rhythm, no murmurs appreciated Pulmonary/Chest: Clear to auscultation bilaterally, no wheezes or rails Abdominal: Soft.  no distension.  no tenderness.  Musculoskeletal: Normal range of motion Neurological:  normal muscle tone. Coordination normal. No atrophy Skin: Skin warm and dry Large region of ecchymosis left radial access site Psychiatric: normal affect, pleasant   Discharge Vitals Blood pressure (!) 178/83, pulse 74, temperature 98.4 F (36.9 C), temperature source Oral, resp. rate 19, height '5\' 2"'$  (1.575 m), weight 65.3 kg, SpO2 96 %.  Filed Weights   05/14/18 0706 05/14/18 1538 05/15/18 0326  Weight: 68.3 kg 65.3 kg 65.3 kg    Labs & Radiologic Studies    CBC Recent Labs    05/14/18 1547 05/15/18 0621  WBC 8.3 8.7  HGB 9.0* 8.9*  HCT 30.5* 30.2*  MCV 84.5 84.1  PLT 304 073   Basic Metabolic Panel Recent Labs    05/14/18 1547 05/15/18 0621  NA  --  139  K  --  3.6  CL  --  108  CO2  --  26  GLUCOSE  --  102*  BUN  --  18  CREATININE 1.20* 1.24*  CALCIUM  --  8.4*   Liver Function Tests No results for input(s): AST, ALT, ALKPHOS, BILITOT, PROT, ALBUMIN in the last 72 hours. No results for input(s): LIPASE, AMYLASE in the last 72 hours. Cardiac Enzymes No results for input(s): CKTOTAL, CKMB, CKMBINDEX, TROPONINI in the last 72 hours. BNP Invalid input(s): POCBNP D-Dimer No results for input(s): DDIMER in the last 72 hours. Hemoglobin A1C No results for input(s): HGBA1C in the last 72 hours. Fasting Lipid Panel No results for input(s): CHOL, HDL, LDLCALC, TRIG, CHOLHDL, LDLDIRECT in the last 72 hours. Thyroid Function Tests No results for input(s): TSH, T4TOTAL, T3FREE, THYROIDAB in the last 72 hours.  Invalid input(s): FREET3 _____________  No results found. Disposition   Pt is being  discharged home today in good condition.  Follow-up Plans & Appointments   10 OFFICE VISIT 20 with Ida Rogue, MD  Monday Jun 03, 2018 3:20 PM  Please arrive 15 minutes prior to your appointment. This will allow [place] to verify and update your medical record and ensure a full appointment for you within the time allotted.    Follow-up Information    [facility] Follow up.   Specialty:  Cardiac Rehabilitation Why:  Your cardiologist has referred you to outpatient Cardiac Rehab at [facility].  The Cardiac Rehab department will contact you within one week of discharge to schedule your first appointment.   Contact information: [place] [id number] [address] [place] [phone]         Discharge Instructions    AMB Referral to Cardiac Rehabilitation - Phase II   Complete by:  As directed    Diagnosis:  Coronary Stents      Discharge Medications   Allergies as of 05/15/2018      Reactions   Nsaids    Bleeding risk   Penicillins Anaphylaxis, Swelling, Rash   Has patient had a PCN reaction causing immediate rash, facial/tongue/throat swelling, SOB or lightheadedness with hypotension: Yes Has patient had a PCN reaction causing severe rash involving mucus membranes or skin necrosis: No Has patient had a PCN reaction that required hospitalization: No  Has patient had a PCN reaction occurring within the last 10 years: No If all of the above answers are "NO", then may proceed with Cephalosporin use.   Atorvastatin Other (See Comments)   Muscle Pain   Gabapentin Swelling   Ezetimibe Other (See Comments)   Muscle pain   Aspirin    GI bleeding risk      Medication List    STOP taking these medications   BAYER BACK & BODY PAIN EX ST PO   omeprazole 20 MG capsule Commonly known as:  PRILOSEC Replaced by:  pantoprazole 40 MG tablet     TAKE these medications   ADVAIR DISKUS 500-50 MCG/DOSE Aepb Generic drug:   Fluticasone-Salmeterol Inhale 1 puff into the lungs 2 (two) times daily.   albuterol 108 (90 Base) MCG/ACT inhaler Commonly known as:  PROVENTIL HFA;VENTOLIN HFA Inhale 2 puffs into the lungs every 6 (six) hours as needed for wheezing or shortness of breath.   aspirin EC 81 MG tablet Take 81 mg by mouth daily.   baclofen 10 MG tablet Commonly known as:  LIORESAL Take 10 mg by mouth at bedtime.   Calcium-Vitamin D-Vitamin K 323-557-32 MG-UNT-MCG Tabs Take 1 tablet by mouth daily.   carvedilol 3.125 MG tablet Commonly known as:  COREG Take 1 tablet (3.125 mg total) by mouth 2 (two) times daily with a meal.   cholecalciferol 25 MCG (1000 UT) tablet Commonly known as:  VITAMIN D3 Take 1,000 Units by mouth daily.   clopidogrel 75 MG tablet Commonly known as:  PLAVIX Take 1 tablet (75 mg total) by mouth daily with breakfast. Start taking on:  May 16, 2018   Coenzyme Q10 300 MG Caps Take 300 mg by mouth daily.   DULoxetine 60 MG capsule Commonly known as:  CYMBALTA Take 60 mg by mouth daily.   DULoxetine 30 MG capsule Commonly known as:  CYMBALTA Take 30 mg by mouth every morning.   enalapril 2.5 MG tablet Commonly known as:  VASOTEC Take 1 tablet (2.5 mg total) by mouth daily.   furosemide 20 MG tablet Commonly known as:  LASIX Take 1 tablet (20 mg total) by mouth as needed for edema.   letrozole 2.5 MG tablet Commonly known as:  FEMARA Take 1 tablet (2.5 mg total) by mouth daily.   lidocaine-prilocaine cream Commonly known as:  EMLA Apply 1 application as needed topically (for port access).   magnesium oxide 400 MG tablet Commonly known as:  MAG-OX Take 400 mg by mouth daily.   Melatonin 5 MG Tabs Take 10 mg by mouth at bedtime as needed (sleep).   montelukast 10 MG tablet Commonly known as:  SINGULAIR Take 10 mg by mouth at bedtime.   pantoprazole 40 MG tablet Commonly known as:  PROTONIX Take 1 tablet (40 mg total) by mouth daily. Start taking  on:  May 16, 2018 Replaces:  omeprazole 20 MG capsule   rosuvastatin 20 MG tablet Commonly known as:  CRESTOR Take 1 tablet (20 mg total) by mouth daily  at 6 PM.   spironolactone 25 MG tablet Commonly known as:  ALDACTONE Take 0.5 tablets (12.5 mg total) by mouth daily.        Acute coronary syndrome (MI, NSTEMI, STEMI, etc) this admission?: No.    Outstanding Labs/Studies   Will need echo scheduled as outpatient to reassess EF, as recommended above.   Duration of Discharge Encounter   Greater than 30 minutes including physician time.  Signed, Arvil Chaco, PA-C 05/15/2018, 1:07 PM

## 2018-05-15 NOTE — Telephone Encounter (Signed)
-----   Message from Arvil Chaco, PA-C sent at 05/15/2018 12:53 PM EST ----- Regarding: TCM Hello,   Krista Cisneros was admitted to [facility] for observation following staged PCI of Lcx and will need TCM follow-up within 2 weeks of discharge. She will be discharged today.   Are you able to schedule her with Dr. Rockey Situ or app within 2 weeks?  Thank you!  Sincerely,  Arvil Chaco, PA-C 05/15/2018, 12:55 PM Pager [phone]

## 2018-05-16 NOTE — Telephone Encounter (Signed)
Patient contacted regarding discharge from [facility] on 05/15/2018.  Patient understands to follow up with provider Dr. Rockey Situ  on 06/03/18 at 3:20 PM at [facility]. Patient understands discharge instructions? Yes Patient understands medications and regiment? Yes Patient understands to bring all medications to this visit? Yes  Spoke with patients daughter per release form. She states that she still has bruising but it is unchanged and no swelling. Instructed her to not lift anything heavy and to continue monitoring. Reviewed signs and symptoms that she would need evaluation.

## 2018-05-16 NOTE — Telephone Encounter (Signed)
Left voicemail message to call back  

## 2018-05-19 NOTE — Progress Notes (Signed)
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  INTERVAL HISTORY: Patient returns to clinic today for routine 66-monthevaluation.  She was recently admitted to the hospital with chest pain and increasing fatigue.  She is found to have significant cardiac disease requiring the placement of 3 stents.  She feels significantly improved and nearly back to her baseline.  She continues to tolerate letrozole well without significant side effects.  She does not complain of weakness or fatigue today.  She has no neurologic complaints. She denies any recent fevers or illnesses.  She has no chest pain, cough, or shortness of breath. She denies any nausea, vomiting, constipation, or diarrhea.  She has no urinary complaints.  Patient offers no further specific complaints today.  REVIEW OF SYSTEMS:   Review of Systems  Constitutional: Negative.  Negative for fever, malaise/fatigue and weight loss.  Respiratory: Negative.  Negative for cough and shortness of breath.   Cardiovascular: Negative.  Negative for chest pain and leg swelling.  Gastrointestinal: Negative.  Negative for abdominal pain, constipation, diarrhea and nausea.  Genitourinary: Negative.  Negative for dysuria.  Musculoskeletal: Negative.  Negative for joint pain.  Skin: Negative.  Negative for rash.  Neurological: Negative.  Negative for sensory change, focal weakness, weakness and headaches.    Psychiatric/Behavioral: Negative.  The patient is not nervous/anxious.     As per HPI. Otherwise, a complete review of systems is negative.  PAST MEDICAL HISTORY: Past Medical History:  Diagnosis Date  . Anxiety   . Arthritis   . CAD (coronary artery disease)    a. NSTEMI 12/19; b. LHC 04/10/18: pLAD 95%, mLAD 80%, m-dLCx 95%, OM3-1 lesion 40%, OM3-2 lesion 60%, mRCA 50%, EF 35-45%, successful PCI/DES x 2 to the LAD with recommended staged PCI of the LCx in a few weeks  . Cancer ([facility]    Right Breast Cancer  . Chronic systolic CHF (congestive heart failure) [place]    a. TTE 12/19: EF 30-35%, anteroseptal, anterior, and apical HK, Gr1DD, mild to mod MR, mildly dilated LA, RVSF nl  . COPD (chronic obstructive pulmonary disease) ([place]   . Depression   . Dyspnea    with exertion  . GERD (gastroesophageal reflux disease)   . Hyperlipidemia   . Hypertension   . Personal history of chemotherapy   . Personal history of radiation therapy     PAST SURGICAL HISTORY: Past Surgical History:  Procedure Laterality Date  . ABDOMINAL HYSTERECTOMY  1990   Partial  . BREAST BIOPSY Right 10/04/2016   axilla lymph node and axillay tail mass biopsy. invasive mammary carcinoma  . BREAST LUMPECTOMY Right 03/21/2017  . BREAST LUMPECTOMY Right 05/21/2017   re-excision  . BREAST LUMPECTOMY WITH NEEDLE LOCALIZATION Right 03/21/2017   Procedure: BREAST LUMPECTOMY WITH NEEDLE LOCALIZATION;  Surgeon: WClayburn Pert MD;  Location: [facility];  Service: General;  Laterality: Right;  . CORONARY STENT INTERVENTION N/A 04/10/2018   Procedure: CORONARY STENT INTERVENTION;  Surgeon: AWellington Hampshire MD;  [facility];  Service: Cardiovascular;  Laterality: N/A;  . CORONARY STENT INTERVENTION N/A 05/14/2018   Procedure: CORONARY STENT INTERVENTION;  Surgeon: Nelva Bush, MD;  Location: [place] [facility];  Service: Cardiovascular;  Laterality: N/A;  . DILATION AND CURETTAGE OF UTERUS     . INGUINAL HERNIA REPAIR Right 03/26/2017   Procedure: HERNIA REPAIR INGUINAL INCARCERATED;  Surgeon: Jules Husbands, MD;  Location: [facility];  Service: General;  Laterality: Right;  . LEFT HEART CATH AND CORONARY ANGIOGRAPHY N/A 04/10/2018   Procedure: LEFT HEART CATH AND CORONARY ANGIOGRAPHY poss PCI;  Surgeon: Minna Merritts, MD;  Location: [place] [facility];  Service: Cardiovascular;  Laterality: N/A;  . PORTACATH PLACEMENT Left 10/24/2016   Procedure: INSERTION PORT-A-CATH;  Surgeon: [name], MD;  Location: [facility];  Service: General;  Laterality: Left;  . RE-EXCISION OF BREAST LUMPECTOMY Right 05/21/2017   Procedure: RE-EXCISION OF BREAST LUMPECTOMY;  Surgeon: Clayburn Pert, MD;  Location: [facility];  Service: General;  Laterality: Right;  . SENTINEL NODE BIOPSY Right 03/21/2017   Procedure: SENTINEL NODE BIOPSY;  Surgeon: Clayburn Pert, MD;  Location: [facility];  Service: General;  Laterality: Right;    FAMILY HISTORY: Family History  Problem Relation Age of Onset  . Colon cancer Mother   . Breast cancer Neg Hx     ADVANCED DIRECTIVES (Y/N):  N  HEALTH MAINTENANCE: Social History   Tobacco Use  . Smoking status: Former Smoker    Packs/day: 0.50    Types: Cigarettes    Last attempt to quit: 07/23/1988    Years since quitting: 29.8  . Smokeless tobacco: Never Used  Substance Use Topics  . Alcohol use: No  . Drug use: No     Colonoscopy:  PAP:  Bone density:  Lipid panel:  Allergies  Allergen Reactions  . Nsaids     Bleeding risk  . Penicillins Anaphylaxis, Swelling and Rash    Has patient had a PCN reaction causing immediate rash, facial/tongue/throat swelling, SOB or lightheadedness with hypotension: Yes Has patient had a PCN reaction causing severe rash involving mucus membranes or skin necrosis: No Has patient had a PCN reaction that required hospitalization: No  Has patient had a PCN reaction occurring within the last 10 years: No If all of the  above answers are "NO", then may proceed with Cephalosporin use.   . Atorvastatin Other (See Comments)    Muscle Pain  . Gabapentin Swelling  . Ezetimibe Other (See Comments)    Muscle pain  . Aspirin     GI bleeding risk    Current Outpatient Medications  Medication Sig Dispense Refill  . ADVAIR DISKUS 500-50 MCG/DOSE AEPB Inhale 1 puff into the lungs 2 (two) times daily.     Marland Kitchen albuterol (PROVENTIL HFA;VENTOLIN HFA) 108 (90 Base) MCG/ACT inhaler Inhale 2 puffs into the lungs every 6 (six) hours as needed for wheezing or shortness of breath.    Marland Kitchen aspirin EC 81 MG tablet Take 81 mg by mouth daily.    . baclofen (LIORESAL) 10 MG tablet Take 10 mg by mouth at bedtime.  9  . Calcium-Vitamin D-Vitamin K 588-325-49 MG-UNT-MCG TABS Take 1 tablet by mouth daily.    . carvedilol (COREG) 3.125 MG tablet Take 1 tablet (3.125 mg total) by mouth 2 (two) times daily with a meal. 180 tablet 4  . cholecalciferol (VITAMIN D3) 25 MCG (1000 UT) tablet Take 1,000 Units by mouth daily.    . clopidogrel (PLAVIX) 75 MG tablet Take 1 tablet (75 mg total)  by mouth daily with breakfast. 90 tablet 4  . Coenzyme Q10 300 MG CAPS Take 300 mg by mouth daily.     . DULoxetine (CYMBALTA) 30 MG capsule Take 30 mg by mouth every morning.   11  . DULoxetine (CYMBALTA) 60 MG capsule Take 60 mg by mouth daily.   11  . enalapril (VASOTEC) 2.5 MG tablet Take 1 tablet (2.5 mg total) by mouth daily. 90 tablet 4  . furosemide (LASIX) 20 MG tablet Take 1 tablet (20 mg total) by mouth as needed for edema. 30 tablet 11  . letrozole (FEMARA) 2.5 MG tablet Take 1 tablet (2.5 mg total) by mouth daily. 90 tablet 3  . lidocaine-prilocaine (EMLA) cream Apply 1 application as needed topically (for port access).    . magnesium oxide (MAG-OX) 400 MG tablet Take 400 mg by mouth daily.    . Melatonin 5 MG TABS Take 10 mg by mouth at bedtime as needed (sleep).    . montelukast (SINGULAIR) 10 MG tablet Take 10 mg by mouth at bedtime.     .  pantoprazole (PROTONIX) 40 MG tablet Take 1 tablet (40 mg total) by mouth daily. 90 tablet 3  . rosuvastatin (CRESTOR) 20 MG tablet Take 1 tablet (20 mg total) by mouth daily at 6 PM. 90 tablet 4  . spironolactone (ALDACTONE) 25 MG tablet Take 0.5 tablets (12.5 mg total) by mouth daily. 45 tablet 3   No current facility-administered medications for this visit.    Facility-Administered Medications Ordered in Other Visits  Medication Dose Route Frequency Provider Last Rate Last Dose  . 0.9 %  sodium chloride infusion   Intravenous Once Lloyd Huger, MD      . 0.9 %  sodium chloride infusion   Intravenous Once Lloyd Huger, MD      . 0.9 %  sodium chloride infusion   Intravenous Once Lloyd Huger, MD      . 0.9 %  sodium chloride infusion   Intravenous Continuous Grayland Ormond, Kathlene November, MD      . ondansetron (ZOFRAN) 8 mg in sodium chloride 0.9 % 50 mL IVPB   Intravenous Once Lloyd Huger, MD        OBJECTIVE: Vitals:   05/21/18 1420  BP: (!) 156/86  Pulse: 93  Temp: (!) 97.1 F (36.2 C)     Body mass index is 26.34 kg/m.    ECOG FS:0 - Asymptomatic  General: Well-developed, well-nourished, no acute distress. Eyes: Pink conjunctiva, anicteric sclera. HEENT: Normocephalic, moist mucous membranes. Breast: Patient request exam be deferred today. Lungs: Clear to auscultation bilaterally. Heart: Regular rate and rhythm. No rubs, murmurs, or gallops. Abdomen: Soft, nontender, nondistended. No organomegaly noted, normoactive bowel sounds. Musculoskeletal: No edema, cyanosis, or clubbing. Neuro: Alert, answering all questions appropriately. Cranial nerves grossly intact. Skin: No rashes or petechiae noted. Psych: Normal affect.  LAB RESULTS:  Lab Results  Component Value Date   NA 139 05/15/2018   K 3.6 05/15/2018   CL 108 05/15/2018   CO2 26 05/15/2018   GLUCOSE 102 (H) 05/15/2018   BUN 18 05/15/2018   CREATININE 1.24 (H) 05/15/2018   CALCIUM 8.4 (L)  05/15/2018   PROT 7.3 03/12/2018   ALBUMIN 3.5 03/12/2018   AST 24 03/12/2018   ALT 14 03/12/2018   ALKPHOS 60 03/12/2018   BILITOT 0.7 03/12/2018   GFRNONAA 42 (L) 05/15/2018   GFRAA 49 (L) 05/15/2018    Lab Results  Component Value Date  WBC 8.7 05/15/2018   NEUTROABS 5.9 05/09/2018   HGB 8.9 (L) 05/15/2018   HCT 30.2 (L) 05/15/2018   MCV 84.1 05/15/2018   PLT 331 05/15/2018     STUDIES: No results found.  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  PLAN:    1. 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  Treatment was discontinued secondary to declining performance status.  Patient underwent lumpectomy on March 21, 2017, but no biopsy clip was seen in her surgical specimen.  She had repeat surgery on May 21, 2017 to remove her retained clip, which confirmed a complete pathological response. Patient completed her adjuvant XRT on September 27, 2017.  Continue letrozole for a total of 5 years completing in February 2024.  Patient's most recent mammogram on Sep 13, 2017 was reported as BI-RADS 2.  Repeat in May 2020.  Return to clinic in 6 months for routine evaluation. 2. Renal insufficiency: Patient's most recent creatinine is 1.24 which appears to be approximately her baseline. 3. Hypokalemia: Resolved.  Continue 20 mEq oral potassium supplementation daily. 4. Anemia: Patient's hemoglobin is decreased at 8.9, but stable.  Monitor. 5.  Bone health: Patient had a bone mineral density on July 12, 2017 reported T score of -0.5.  This is considered normal.  Continue calcium and vitamin D supplementation.  Repeat in March 2021. 6.  Cardiac disease: Patient now has 3 stents placed.  Continue follow-up and treatment per cardiology.  I spent a total of 30  minutes face-to-face with the patient of which greater than 50% of the visit was spent in counseling and coordination of care as detailed above.   Patient expressed understanding and was in agreement with this plan. She also understands that She can call clinic at any time with any questions, concerns, or complaints.   Cancer Staging Malignant neoplasm of right female breast [facility]) Staging form: Breast, AJCC 8th Edition - Clinical stage from 10/16/2016: Stage IIB (cT2, cN1, cM0, G3, ER: Negative, PR: Positive, HER2: Positive) - Signed by Lloyd Huger, MD on 10/16/2016   Lloyd Huger, MD   05/22/2018 5:09 PM

## 2018-05-21 ENCOUNTER — Encounter: Payer: Self-pay | Admitting: Oncology

## 2018-05-21 ENCOUNTER — Inpatient Hospital Stay: Payer: Medicare Other | Attending: Oncology

## 2018-05-21 ENCOUNTER — Inpatient Hospital Stay (HOSPITAL_BASED_OUTPATIENT_CLINIC_OR_DEPARTMENT_OTHER): Payer: Medicare Other | Admitting: Oncology

## 2018-05-21 ENCOUNTER — Other Ambulatory Visit: Payer: Self-pay

## 2018-05-21 VITALS — BP 156/86 | HR 93 | Temp 97.1°F | Ht 62.0 in | Wt 144.0 lb

## 2018-05-21 DIAGNOSIS — N289 Disorder of kidney and ureter, unspecified: Secondary | ICD-10-CM

## 2018-05-21 DIAGNOSIS — Z95828 Presence of other vascular implants and grafts: Secondary | ICD-10-CM

## 2018-05-21 DIAGNOSIS — C50411 Malignant neoplasm of upper-outer quadrant of right female breast: Secondary | ICD-10-CM

## 2018-05-21 DIAGNOSIS — Z853 Personal history of malignant neoplasm of breast: Secondary | ICD-10-CM | POA: Insufficient documentation

## 2018-05-21 DIAGNOSIS — D649 Anemia, unspecified: Secondary | ICD-10-CM | POA: Insufficient documentation

## 2018-05-21 DIAGNOSIS — Z171 Estrogen receptor negative status [ER-]: Principal | ICD-10-CM

## 2018-05-21 DIAGNOSIS — Z452 Encounter for adjustment and management of vascular access device: Secondary | ICD-10-CM | POA: Insufficient documentation

## 2018-05-21 MED ORDER — SODIUM CHLORIDE 0.9% FLUSH
10.0000 mL | Freq: Once | INTRAVENOUS | Status: AC
  Administered 2018-05-21: 10 mL via INTRAVENOUS
  Filled 2018-05-21: qty 10

## 2018-05-21 MED ORDER — HEPARIN SOD (PORK) LOCK FLUSH 100 UNIT/ML IV SOLN
500.0000 [IU] | Freq: Once | INTRAVENOUS | Status: AC
  Administered 2018-05-21: 500 [IU] via INTRAVENOUS
  Filled 2018-05-21: qty 5

## 2018-05-21 NOTE — Progress Notes (Signed)
Patient is here today to follow up on her Malignant neoplasm of upper-outer quadrant of right breast. Patient stated that she had been doing well. Patient denied nipple discharge, skin discoloration, lumps/knots and pain. Patient's last mammogram was done on 09/13/2017 and it was benign.

## 2018-05-27 ENCOUNTER — Encounter: Payer: Medicare Other | Attending: Internal Medicine

## 2018-05-27 NOTE — Progress Notes (Signed)
Cardiology Office Note  Date:  06/03/2018   ID:  Krista, Cisneros [birth year], MRN [id number]  PCP:  Sharyne Peach, MD   Chief Complaint  Patient presents with  . other    2 week follow up tcm [facility], s/p PCI of LCX. Medications reviewed verbally with patient.     HPI: [name] is a 78 y.o. 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 abuse quitting in 1990,  HTN,  HLD,  breast cancer s/p chemoradiation,  GERD admission to [facility] from 04/09/18 to 04/12/18 for NSTEMI, successful PCI/DES x 2 to the LAD at that time,  cardiac catheterization with stent to the left circumflex  INTERVAL HISTORY: She reports today for hospital follow up She is accompanied by her daughter. On 05/14/2018 she was admitted for NSTEMI along with chest pain and in worsening fatigue.   Recent cardiac catheterization results detailed below, discussed with her 2 stents to the LAD, staged procedure with follow-up stent to the left circumflex  In the near future she will be attending cardiac rehab.  Denies any anginal symptoms, has not started a regular walking program  She is hoping to remove the port (used by oncology ) in the future.   EKG personally reviewed by me today  Showing normal sinus rhythm, T wave abnormality, consider lateral ischemia  OTHER PAST MEDICAL HISTORY REVIEWED BY ME FOR TODAY'S VISIT: cath 04/10/18 Multivessel disease Severe mid LAD disease 95%, mid to distal stenosis as well 80% Proximal OM 3 disease large vessel 95% Moderate RCA disease 50% mid vessel  Successful angioplasty and drug-eluting stent placement to the LAD x2.  staged cath  Conclusions: 1. 90% mid LCx stenosis, unchanged from catheterization last month. 2. Widely patent proximal and mid LAD stents. 3. Successful PCI to mid LCx using Resolute Onyx 2.25 x 15 mm drug-eluting stent (postdilated to 2.6 mm)  with 0% residual stenosis and TIMI-3 flow.   Ejection fraction of 30 to 35% by echocardiogram April 10, 2018  PMH:   has a past medical history of Anxiety, Arthritis, CAD (coronary artery disease), Cancer ([place]), Chronic systolic CHF (congestive heart failure) ([place]), COPD (chronic obstructive pulmonary disease) ([place]), Depression, Dyspnea, GERD (gastroesophageal reflux disease), Hyperlipidemia, Hypertension, Personal history of chemotherapy, and Personal history of radiation therapy.  PSH:    Past Surgical History:  Procedure Laterality Date  . ABDOMINAL HYSTERECTOMY  1990   Partial  . BREAST BIOPSY Right 10/04/2016   axilla lymph node and axillay tail mass biopsy. invasive mammary carcinoma  . BREAST LUMPECTOMY Right 03/21/2017  . BREAST LUMPECTOMY Right 05/21/2017   re-excision  . BREAST LUMPECTOMY WITH NEEDLE LOCALIZATION Right 03/21/2017   Procedure: BREAST LUMPECTOMY WITH NEEDLE LOCALIZATION;  Surgeon: Clayburn Pert, MD;  Location: [facility] OR[facility];  Service: General;  Laterality: Right;  . CORONARY STENT INTERVENTION N/A 04/10/2018   Procedure: CORONARY STENT INTERVENTION;  Surgeon: Wellington Hampshire, MD;  Location: [place] [facility];  Service: Cardiovascular;  Laterality: N/A;  . CORONARY STENT INTERVENTION N/A 05/14/2018   Procedure: CORONARY STENT INTERVENTION;  Surgeon: Nelva Bush, MD;  Location: [place] [facility];  Service: Cardiovascular;  Laterality: N/A;  . DILATION AND CURETTAGE OF UTERUS    . INGUINAL HERNIA REPAIR Right 03/26/2017   Procedure: HERNIA REPAIR INGUINAL INCARCERATED;  Surgeon: Jules Husbands, MD;  Location: [facility];  Service: General;  Laterality: Right;  .  LEFT HEART CATH AND CORONARY ANGIOGRAPHY N/A 04/10/2018   Procedure: LEFT HEART CATH AND CORONARY ANGIOGRAPHY poss PCI;  Surgeon: Minna Merritts, MD;  Location: [place] [facility];  Service: Cardiovascular;  Laterality: N/A;  . PORTACATH PLACEMENT Left 10/24/2016   Procedure: INSERTION  PORT-A-CATH;  Surgeon: [name], MD;  Location: [facility];  Service: General;  Laterality: Left;  . RE-EXCISION OF BREAST LUMPECTOMY Right 05/21/2017   Procedure: RE-EXCISION OF BREAST LUMPECTOMY;  Surgeon: Clayburn Pert, MD;  Location: [facility];  Service: General;  Laterality: Right;  . SENTINEL NODE BIOPSY Right 03/21/2017   Procedure: SENTINEL NODE BIOPSY;  Surgeon: Clayburn Pert, MD;  Location: [facility];  Service: General;  Laterality: Right;    Current Outpatient Medications  Medication Sig Dispense Refill  . ADVAIR DISKUS 500-50 MCG/DOSE AEPB Inhale 1 puff into the lungs 2 (two) times daily.     Marland Kitchen albuterol (PROVENTIL HFA;VENTOLIN HFA) 108 (90 Base) MCG/ACT inhaler Inhale 2 puffs into the lungs every 6 (six) hours as needed for wheezing or shortness of breath.    Marland Kitchen aspirin EC 81 MG tablet Take 81 mg by mouth daily.    . baclofen (LIORESAL) 10 MG tablet Take 10 mg by mouth at bedtime.  9  . Calcium-Vitamin D-Vitamin K 161-096-04 MG-UNT-MCG TABS Take 1 tablet by mouth daily.    . carvedilol (COREG) 3.125 MG tablet Take 1 tablet (3.125 mg total) by mouth 2 (two) times daily with a meal. 180 tablet 4  . cholecalciferol (VITAMIN D3) 25 MCG (1000 UT) tablet Take 1,000 Units by mouth daily.    . clopidogrel (PLAVIX) 75 MG tablet Take 1 tablet (75 mg total) by mouth daily with breakfast. 90 tablet 4  . Coenzyme Q10 300 MG CAPS Take 300 mg by mouth daily.     . DULoxetine (CYMBALTA) 30 MG capsule Take 30 mg by mouth every morning.   11  . DULoxetine (CYMBALTA) 60 MG capsule Take 60 mg by mouth daily.   11  . enalapril (VASOTEC) 2.5 MG tablet Take 1 tablet (2.5 mg total) by mouth daily. 90 tablet 4  . furosemide (LASIX) 20 MG tablet Take 1 tablet (20 mg total) by mouth as needed for edema. 30 tablet 11  . letrozole (FEMARA) 2.5 MG tablet Take 1 tablet (2.5 mg total) by mouth daily. 90 tablet 3  . lidocaine-prilocaine (EMLA) cream Apply 1 application as needed topically (for port access).     . magnesium oxide (MAG-OX) 400 MG tablet Take 400 mg by mouth daily.    . Melatonin 5 MG TABS Take 10 mg by mouth at bedtime as needed (sleep).    . montelukast (SINGULAIR) 10 MG tablet Take 10 mg by mouth at bedtime.     . pantoprazole (PROTONIX) 40 MG tablet Take 1 tablet (40 mg total) by mouth daily. 90 tablet 3  . rosuvastatin (CRESTOR) 20 MG tablet Take 1 tablet (20 mg total) by mouth daily at 6 PM. 90 tablet 4  . spironolactone (ALDACTONE) 25 MG tablet Take 0.5 tablets (12.5 mg total) by mouth daily. 45 tablet 3  . potassium chloride SA (K-DUR,KLOR-CON) 20 MEQ tablet Take 1 tablet (20 mEq total) by mouth daily as needed. 90 tablet 3   No current facility-administered medications for this visit.    Facility-Administered Medications Ordered in Other Visits  Medication Dose Route Frequency Provider Last Rate Last Dose  . 0.9 %  sodium chloride infusion   Intravenous Once Lloyd Huger, MD      .  0.9 %  sodium chloride infusion   Intravenous Once Lloyd Huger, MD      . 0.9 %  sodium chloride infusion   Intravenous Once Lloyd Huger, MD      . 0.9 %  sodium chloride infusion   Intravenous Continuous Grayland Ormond, Kathlene November, MD      . ondansetron (ZOFRAN) 8 mg in sodium chloride 0.9 % 50 mL IVPB   Intravenous Once Lloyd Huger, MD         Allergies:   Nsaids; Penicillins; Atorvastatin; Gabapentin; Ezetimibe; and Aspirin   Social History:  The patient  reports that she quit smoking about 29 years ago. Her smoking use included cigarettes. She smoked 0.50 packs per day. She has never used smokeless tobacco. She reports that she does not drink alcohol or use drugs.   Family History:   family history includes Colon cancer in her mother.    Review of Systems: Review of Systems  Constitutional: Negative.   HENT: Negative.   Respiratory: Negative.  Negative for shortness of breath.   Cardiovascular: Negative.  Negative for chest pain.  Gastrointestinal: Negative.    Genitourinary: Negative.   Musculoskeletal: Negative.   Neurological: Negative.   Psychiatric/Behavioral: Negative.   All other systems reviewed and are negative.    PHYSICAL EXAM: VS:  BP 130/82 (BP Location: Left Arm, Patient Position: Sitting, Cuff Size: Normal)   Pulse 82   Wt 146 lb (66.2 kg)   BMI 26.70 kg/m  , BMI Body mass index is 26.7 kg/m.   GEN: Well nourished, well developed, in no acute distress  HEENT: normal  Neck: no JVD, carotid bruits, or masses Cardiac: RRR; no murmurs, rubs, or gallops,no edema  Respiratory:  Mild crackle to left base, normal work of breathing GI: soft, nontender, nondistended, + BS MS: no deformity or atrophy  Skin: warm and dry, no rash Neuro:  Strength and sensation are intact Psych: euthymic mood, full affect  Recent Labs: 03/12/2018: ALT 14 04/12/2018: Magnesium 2.0 05/15/2018: BUN 18; Creatinine, Ser 1.24; Hemoglobin 8.9; Platelets 331; Potassium 3.6; Sodium 139    Lipid Panel Lab Results  Component Value Date   CHOL 189 04/10/2018   HDL 40 (L) 04/10/2018   LDLCALC 134 (H) 04/10/2018   TRIG 77 04/10/2018       Wt Readings from Last 3 Encounters:  06/03/18 146 lb (66.2 kg)  05/21/18 144 lb (65.3 kg)  05/15/18 143 lb 14.4 oz (65.3 kg)       ASSESSMENT AND PLAN:  Coronary artery disease involving native coronary artery of native heart with angina pectoris (HCC) Plan: EKG 12-Lead Recommend increasing coreg up to 6.25 mg twice a day Recommend increasing enalapril up to 5 mg daily Recommend taking potassium as needed when she takes Lasix Repeat echocardiogram in 2 to 3 months time for ischemic cardiomyopathy  Non-STEMI (non-ST elevated myocardial infarction) ([place]) Plan: EKG 12-Lead No further work up needed In about 3 months get an Echo Prior ejection fraction 30 to 35% She denies any anginal symptoms  Centrilobular emphysema ([place]) Plan: stable  Disposition:   F/U  12 months  Total encounter time more than  25 minutes. Greater than 50% was spent in counseling and coordination of care with the patient.   Orders Placed This Encounter  Procedures  . EKG 12-Lead   I, Margit Banda am acting as a scribe for Ida Rogue, M.D., Ph.D.  I have reviewed the above documentation for accuracy and completeness, and I  agree with the above.   Signed, Esmond Plants, M.D., Ph.D. 06/03/2018  [place], [place]

## 2018-06-02 NOTE — Patient Instructions (Addendum)
Medication Instructions:   1) Please increase the coreg up to 6.25 mg twice a day  2) Please increase the enalapril up to 5 mg daily  If you need a refill on your cardiac medications before your next appointment, please call your pharmacy.    Lab work: No new labs needed   If you have labs (blood work) drawn today and your tests are completely normal, you will receive your results only by:  [place] (if you have MyChart) OR  A paper copy in the mail If you have any lab test that is abnormal or we need to change your treatment, we will call you to review the results.   Testing/Procedures: We will order an echocardiogram after April 2020 for ischemic cardiomyopathy. Your physician has requested that you have an echocardiogram. Echocardiography is a painless test that uses sound waves to create images of your heart. It provides your doctor with information about the size and shape of your heart and how well your hearts chambers and valves are working. This procedure takes approximately one hour. There are no restrictions for this procedure.   Follow-Up: At [facility], you and your health needs are our priority.  As part of our continuing mission to provide you with exceptional heart care, we have created designated Provider Care Teams.  These Care Teams include your primary Cardiologist (physician) and Advanced Practice Providers (APPs -  Physician Assistants and Nurse Practitioners) who all work together to provide you with the care you need, when you need it.   You will need a follow up appointment in 6 months    Please call our office 2 months in advance to schedule this appointment.     Providers on your designated Care Team:    Murray Hodgkins, NP  Christell Faith, PA-C  Marrianne Mood, PA-C  Any Other Special Instructions Will Be Listed Below (If Applicable).  For educational health videos Log in to : [email or web address] Or : [email or web address], password :  [email or web address]   Echocardiogram An echocardiogram is a procedure that uses painless sound waves (ultrasound) to produce an image of the heart. Images from an echocardiogram can provide important information about:  Signs of coronary artery disease (CAD).  Aneurysm detection. An aneurysm is a weak or damaged part of an artery wall that bulges out from the normal force of blood pumping through the body.  Heart size and shape. Changes in the size or shape of the heart can be associated with certain conditions, including heart failure, aneurysm, and CAD.  Heart muscle function.  Heart valve function.  Signs of a past heart attack.  Fluid buildup around the heart.  Thickening of the heart muscle.  A tumor or infectious growth around the heart valves. Tell a health care provider about:  Any allergies you have.  All medicines you are taking, including vitamins, herbs, eye drops, creams, and over-the-counter medicines.  Any blood disorders you have.  Any surgeries you have had.  Any medical conditions you have.  Whether you are pregnant or may be pregnant. What are the risks? Generally, this is a safe procedure. However, problems may occur, including:  Allergic reaction to dye (contrast) that may be used during the procedure. What happens before the procedure? No specific preparation is needed. You may eat and drink normally. What happens during the procedure?   An IV tube may be inserted into one of your veins.  You may receive contrast through this tube. A contrast is an injection that improves  the quality of the pictures from your heart.  A gel will be applied to your chest.  A wand-like tool (transducer) will be moved over your chest. The gel will help to transmit the sound waves from the transducer.  The sound waves will harmlessly bounce off of your heart to allow the heart images to be captured in real-time motion. The images will be recorded on a computer. The procedure  may vary among health care providers and hospitals. What happens after the procedure?  You may return to your normal, everyday life, including diet, activities, and medicines, unless your health care provider tells you not to do that. Summary  An echocardiogram is a procedure that uses painless sound waves (ultrasound) to produce an image of the heart.  Images from an echocardiogram can provide important information about the size and shape of your heart, heart muscle function, heart valve function, and fluid buildup around your heart.  You do not need to do anything to prepare before this procedure. You may eat and drink normally.  After the echocardiogram is completed, you may return to your normal, everyday life, unless your health care provider tells you not to do that. This information is not intended to replace advice given to you by your health care provider. Make sure you discuss any questions you have with your health care provider. Document Released: 04/07/2000 Document Revised: 05/13/2016 Document Reviewed: 05/13/2016 Elsevier Interactive Patient Education  2019 [employer].

## 2018-06-03 ENCOUNTER — Encounter: Payer: Self-pay | Admitting: Cardiovascular Disease

## 2018-06-03 ENCOUNTER — Ambulatory Visit: Payer: Medicare PPO | Admitting: Cardiovascular Disease

## 2018-06-03 VITALS — BP 130/82 | HR 82 | Wt 146.0 lb

## 2018-06-03 DIAGNOSIS — J432 Centrilobular emphysema: Secondary | ICD-10-CM

## 2018-06-03 DIAGNOSIS — I10 Essential (primary) hypertension: Secondary | ICD-10-CM

## 2018-06-03 DIAGNOSIS — I255 Ischemic cardiomyopathy: Secondary | ICD-10-CM | POA: Diagnosis not present

## 2018-06-03 DIAGNOSIS — I214 Non-ST elevation (NSTEMI) myocardial infarction: Secondary | ICD-10-CM | POA: Diagnosis not present

## 2018-06-03 DIAGNOSIS — I25119 Atherosclerotic heart disease of native coronary artery with unspecified angina pectoris: Secondary | ICD-10-CM | POA: Diagnosis not present

## 2018-06-03 DIAGNOSIS — E785 Hyperlipidemia, unspecified: Secondary | ICD-10-CM

## 2018-06-03 MED ORDER — ENALAPRIL MALEATE 5 MG PO TABS: 5.0000 mg | ORAL_TABLET | Freq: Every day | ORAL | 3 refills | Status: DC

## 2018-06-03 MED ORDER — CARVEDILOL 6.25 MG PO TABS: 6.2500 mg | ORAL_TABLET | Freq: Two times a day (BID) | ORAL | 3 refills | Status: DC

## 2018-06-04 ENCOUNTER — Ambulatory Visit: Payer: Medicare Other | Admitting: Oncology

## 2018-06-07 ENCOUNTER — Other Ambulatory Visit: Payer: Self-pay | Admitting: Oncology

## 2018-06-07 ENCOUNTER — Other Ambulatory Visit: Payer: Self-pay | Admitting: Nurse Practitioner

## 2018-06-07 NOTE — Telephone Encounter (Signed)
)     Ref Range & Units 3wk ago  Potassium 3.5 - 5.1 mmol/L 3.6

## 2018-06-27 IMAGING — US US BREAST*R* LIMITED INC AXILLA
1 series · 13 of 14 positions shown · non-contrast
Comparison: Previous exam(s).

CLINICAL DATA: Patient presents for a diagnostic right breast
examination. Patient had a ultrasound-guided core biopsy of an
axillary tail mass and adjacent right axillary lymph node both
positive for cancer in [DATE] with subsequent neoadjuvant
chemotherapy. Recent right lumpectomy [DATE] demonstrates no
malignancy within the specimen.

EXAM:
2D DIGITAL DIAGNOSTIC right MAMMOGRAM WITH ADJUNCT TOMO
ULTRASOUND right BREAST

[Series 1: us breast*right* limited inc axilla · 0.06mm/px · 13 of 14 slices shown]
[im 1/14]
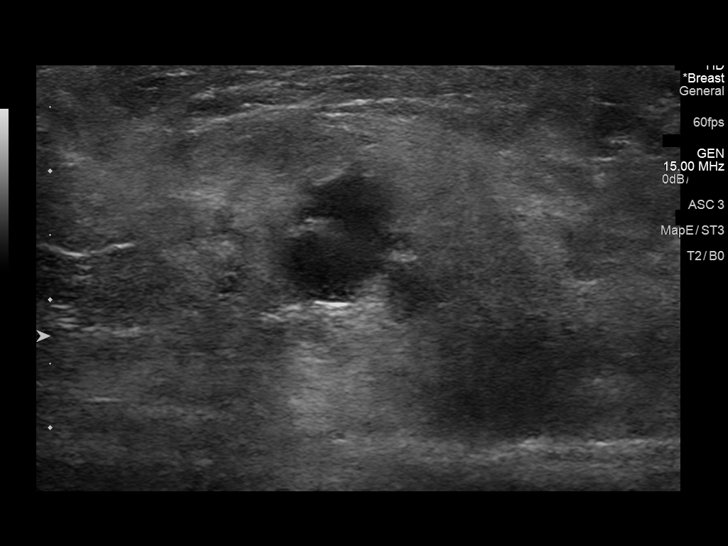
[im 2/14]
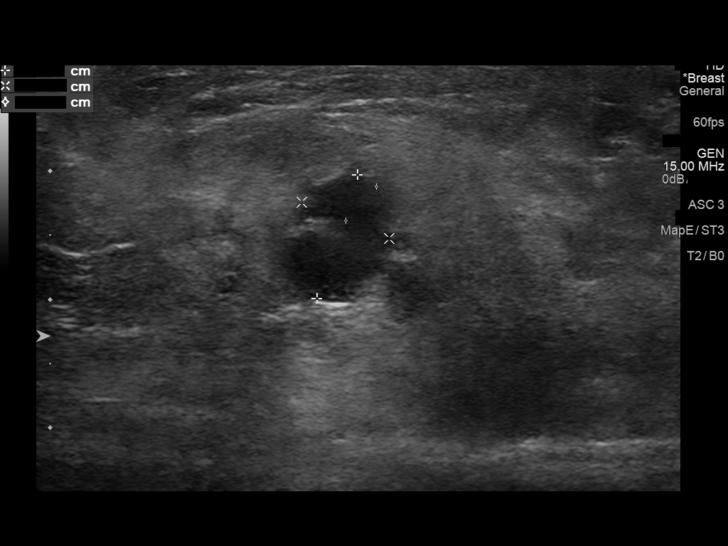
[im 3/14]
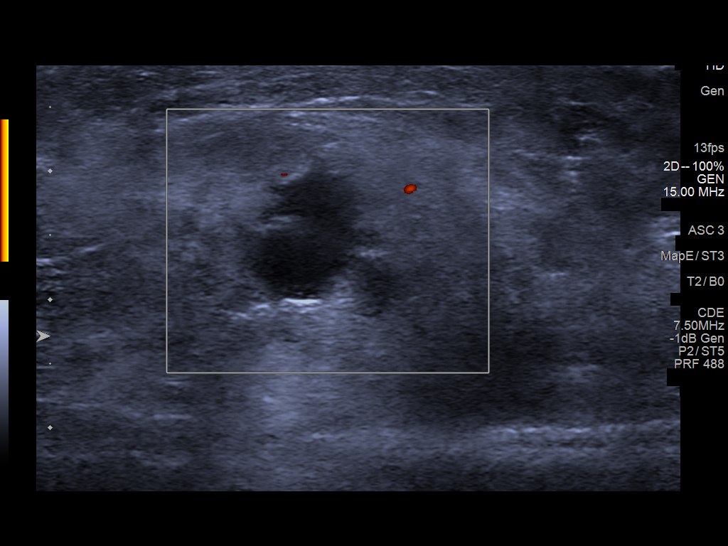
[im 4/14]
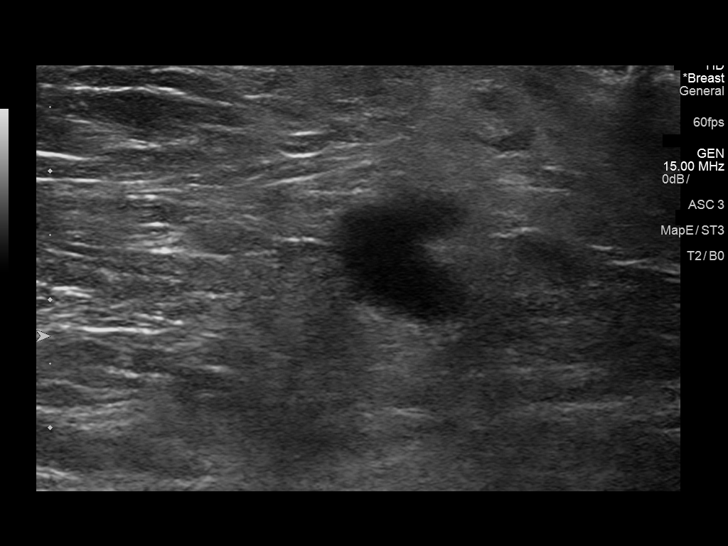
[im 5/14]
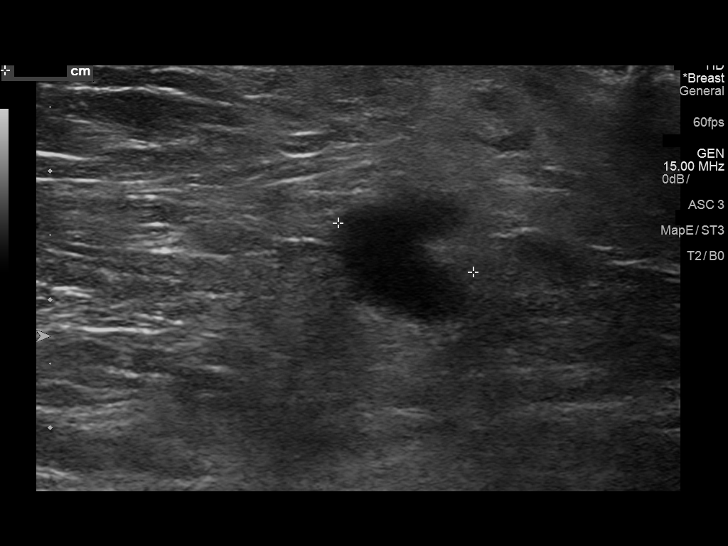
[im 6/14]
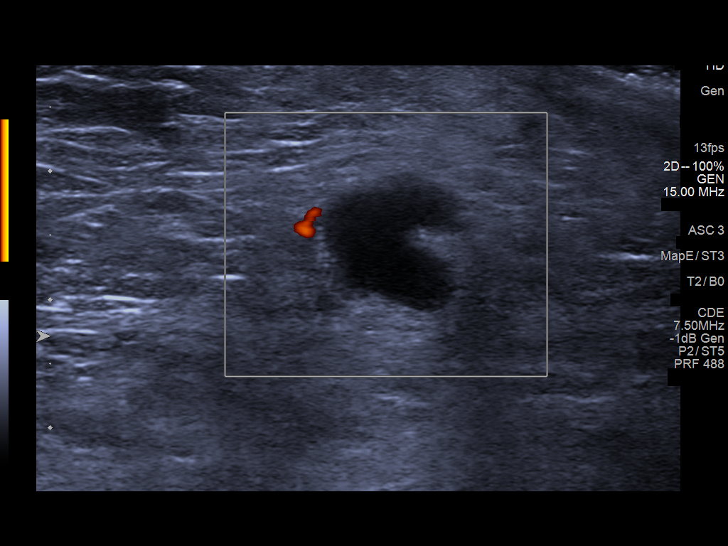
[im 8/14]
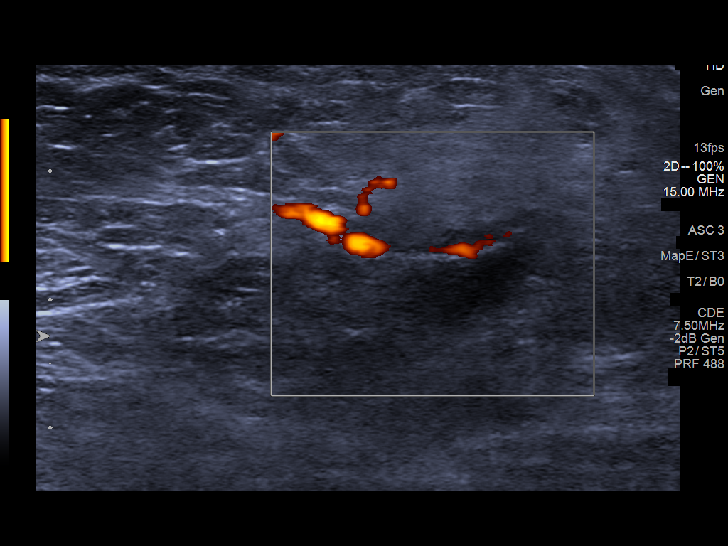
[im 9/14]
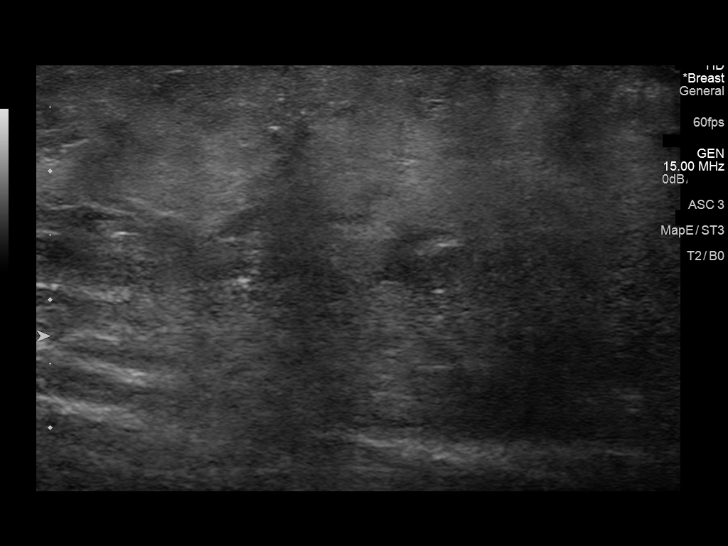
[im 10/14]
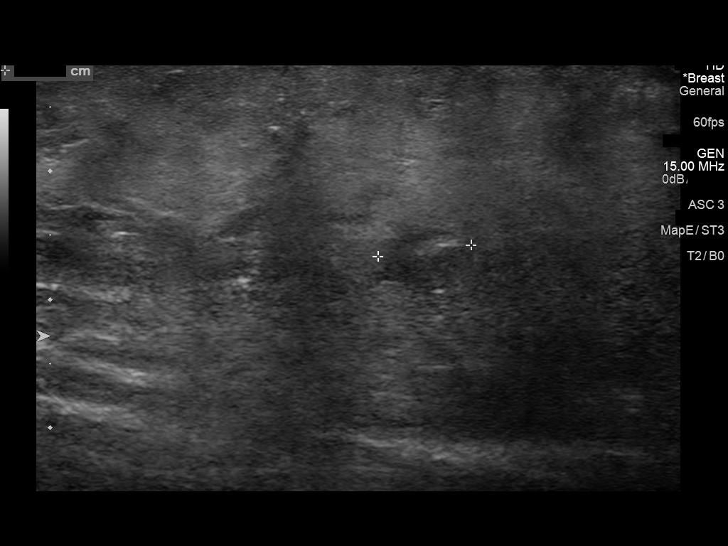
[im 11/14]
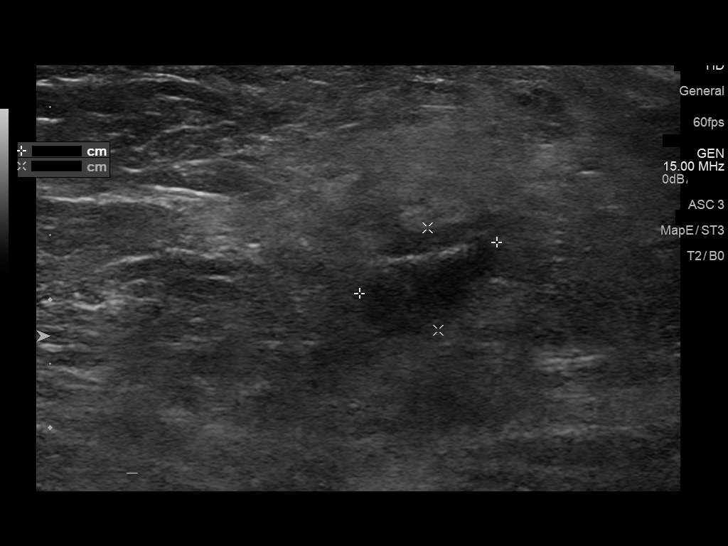
[im 12/14]
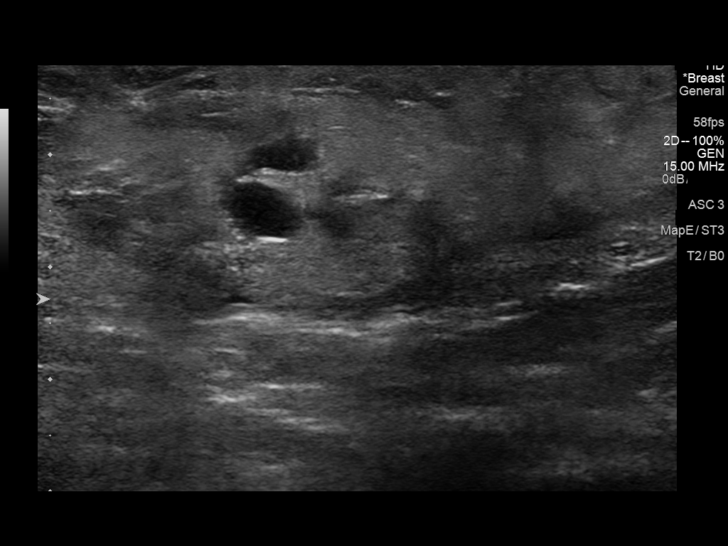
[im 13/14]
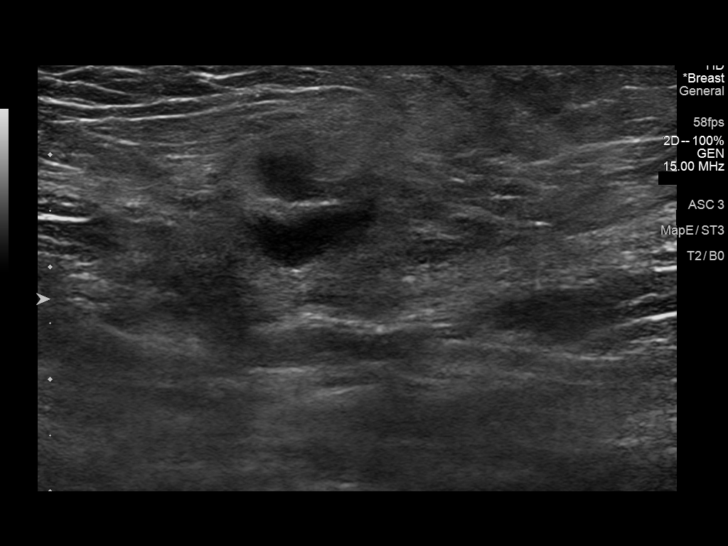
[im 14/14]
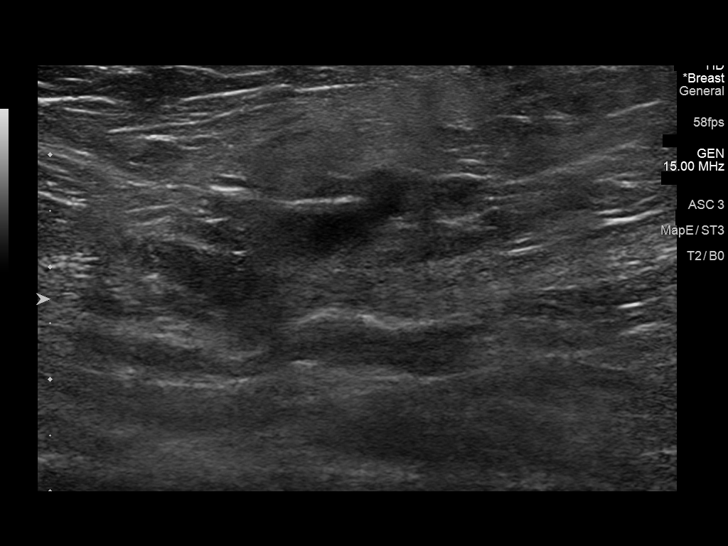

[13 of 14 positions shown; findings below may reference images not displayed]

ACR Breast Density Category c: The breast tissue is heterogeneously
dense, which may obscure small masses.
FINDINGS: Spot tomographic images of the upper outer right breast/axilla were
performed.

There are multiple surgical clips over the upper outer right breast
compatible patient's recent lumpectomy. The ribbon shaped metallic
clip from patient's biopsy-proven malignant right breast mass is
still evident within the right axilla as is the adjacent DUENAS
shaped metallic clip from the biopsy-proven metastatic lymph node.

Targeted ultrasound is performed, showing the biopsy-proven
metastatic lymph node containing HydroMARK clip over the right
axilla just above the lumpectomy scar. There is equivocal
visualization of the biopsy-proven adjacent right breast cancer with
ribbon shaped metallic clip.
IMPRESSION: Evidence of the ribbon shaped metallic clip and adjacent DUENAS
shaped metallic clip from patient's previous biopsy-proven malignant
right breast axillary tail mass and adjacent metastatic axillary
lymph node. The biopsy-proven metastatic lymph node is also seen
sonographically containing HydroMARK clip as there is equivocal
sonographic visualization of the adjacent biopsy prove malignancy
containing ribbon shaped clip.

RECOMMENDATION:
Recommend continued management per clinical treatment plan. Patient
has a surgical appointment with Dr. DUENAS tomorrow.

I have discussed the findings and recommendations with the patient.
Results were also provided in writing at the conclusion of the
visit. If applicable, a reminder letter will be sent to the patient
regarding the next appointment.

BI-RADS CATEGORY  6: Known biopsy-proven malignancy.

## 2018-07-02 ENCOUNTER — Inpatient Hospital Stay: Payer: Medicare Other | Attending: Oncology

## 2018-07-02 DIAGNOSIS — Z853 Personal history of malignant neoplasm of breast: Secondary | ICD-10-CM | POA: Insufficient documentation

## 2018-07-02 DIAGNOSIS — Z452 Encounter for adjustment and management of vascular access device: Secondary | ICD-10-CM | POA: Diagnosis not present

## 2018-07-02 DIAGNOSIS — Z95828 Presence of other vascular implants and grafts: Secondary | ICD-10-CM

## 2018-07-02 MED ORDER — SODIUM CHLORIDE 0.9% FLUSH
10.0000 mL | Freq: Once | INTRAVENOUS | Status: AC
  Administered 2018-07-02: 10 mL via INTRAVENOUS
  Filled 2018-07-02: qty 10

## 2018-07-02 MED ORDER — HEPARIN SOD (PORK) LOCK FLUSH 100 UNIT/ML IV SOLN
500.0000 [IU] | Freq: Once | INTRAVENOUS | Status: AC
  Administered 2018-07-02: 500 [IU] via INTRAVENOUS

## 2018-07-09 ENCOUNTER — Ambulatory Visit
Admission: RE | Admit: 2018-07-09 | Discharge: 2018-07-09 | Disposition: A | Discharge status: Home / Self Care | Payer: Medicare Other | Source: Ambulatory Visit | Attending: Family Medicine | Admitting: Family Medicine

## 2018-07-09 ENCOUNTER — Other Ambulatory Visit: Payer: Self-pay | Admitting: Family Medicine

## 2018-07-09 DIAGNOSIS — R05 Cough: Secondary | ICD-10-CM | POA: Insufficient documentation

## 2018-07-09 DIAGNOSIS — R059 Cough, unspecified: Secondary | ICD-10-CM

## 2018-07-09 IMAGING — CR CHEST - 2 VIEW
1 series · 2 of 2 positions shown · non-contrast
Comparison: [DATE]

CLINICAL DATA: Cough for 2 weeks, history of COPD and breast
carcinoma

EXAM:
CHEST - 2 VIEW

[Series 1: dg chest 2 view · 0.14mm/px · 2 of 2 slices shown]
[im 1/2]
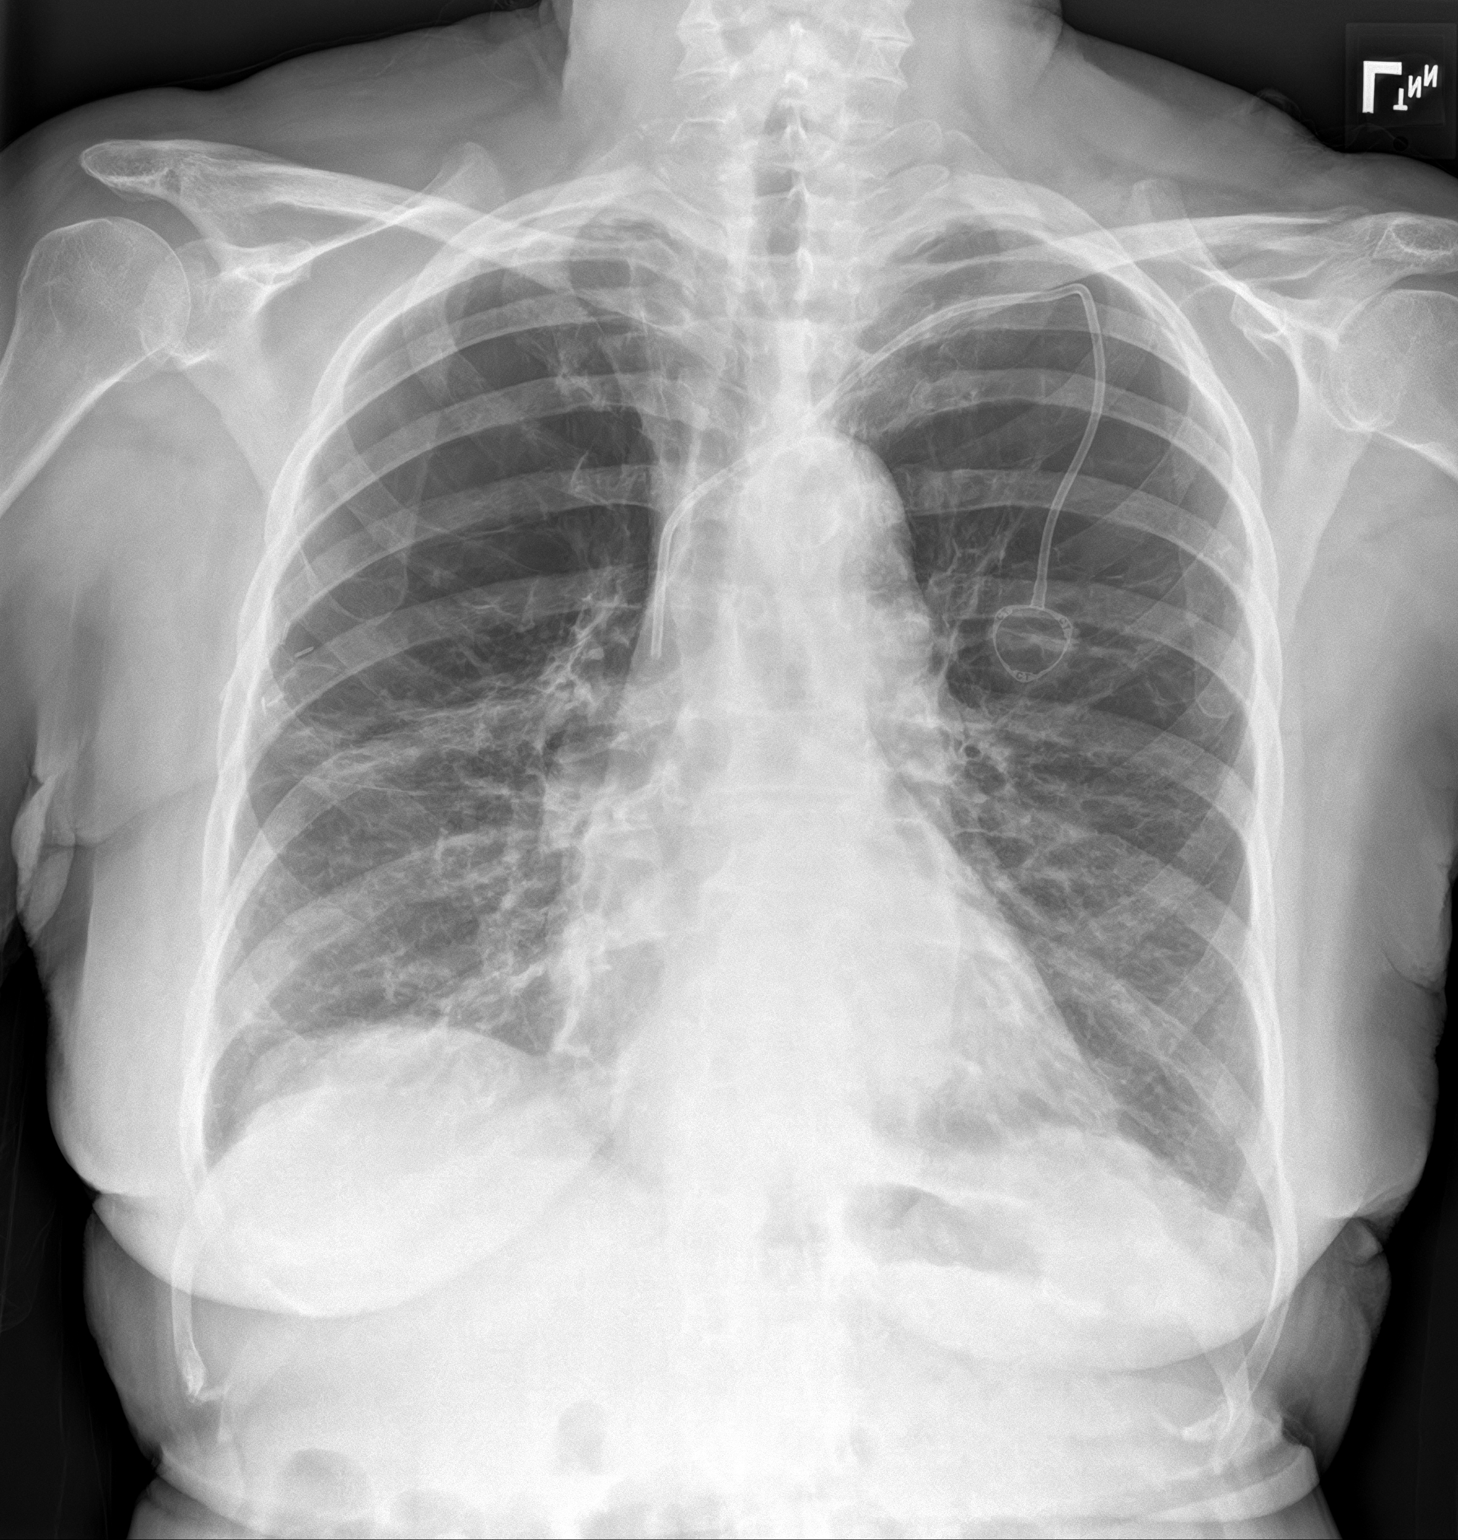
[im 2/2]
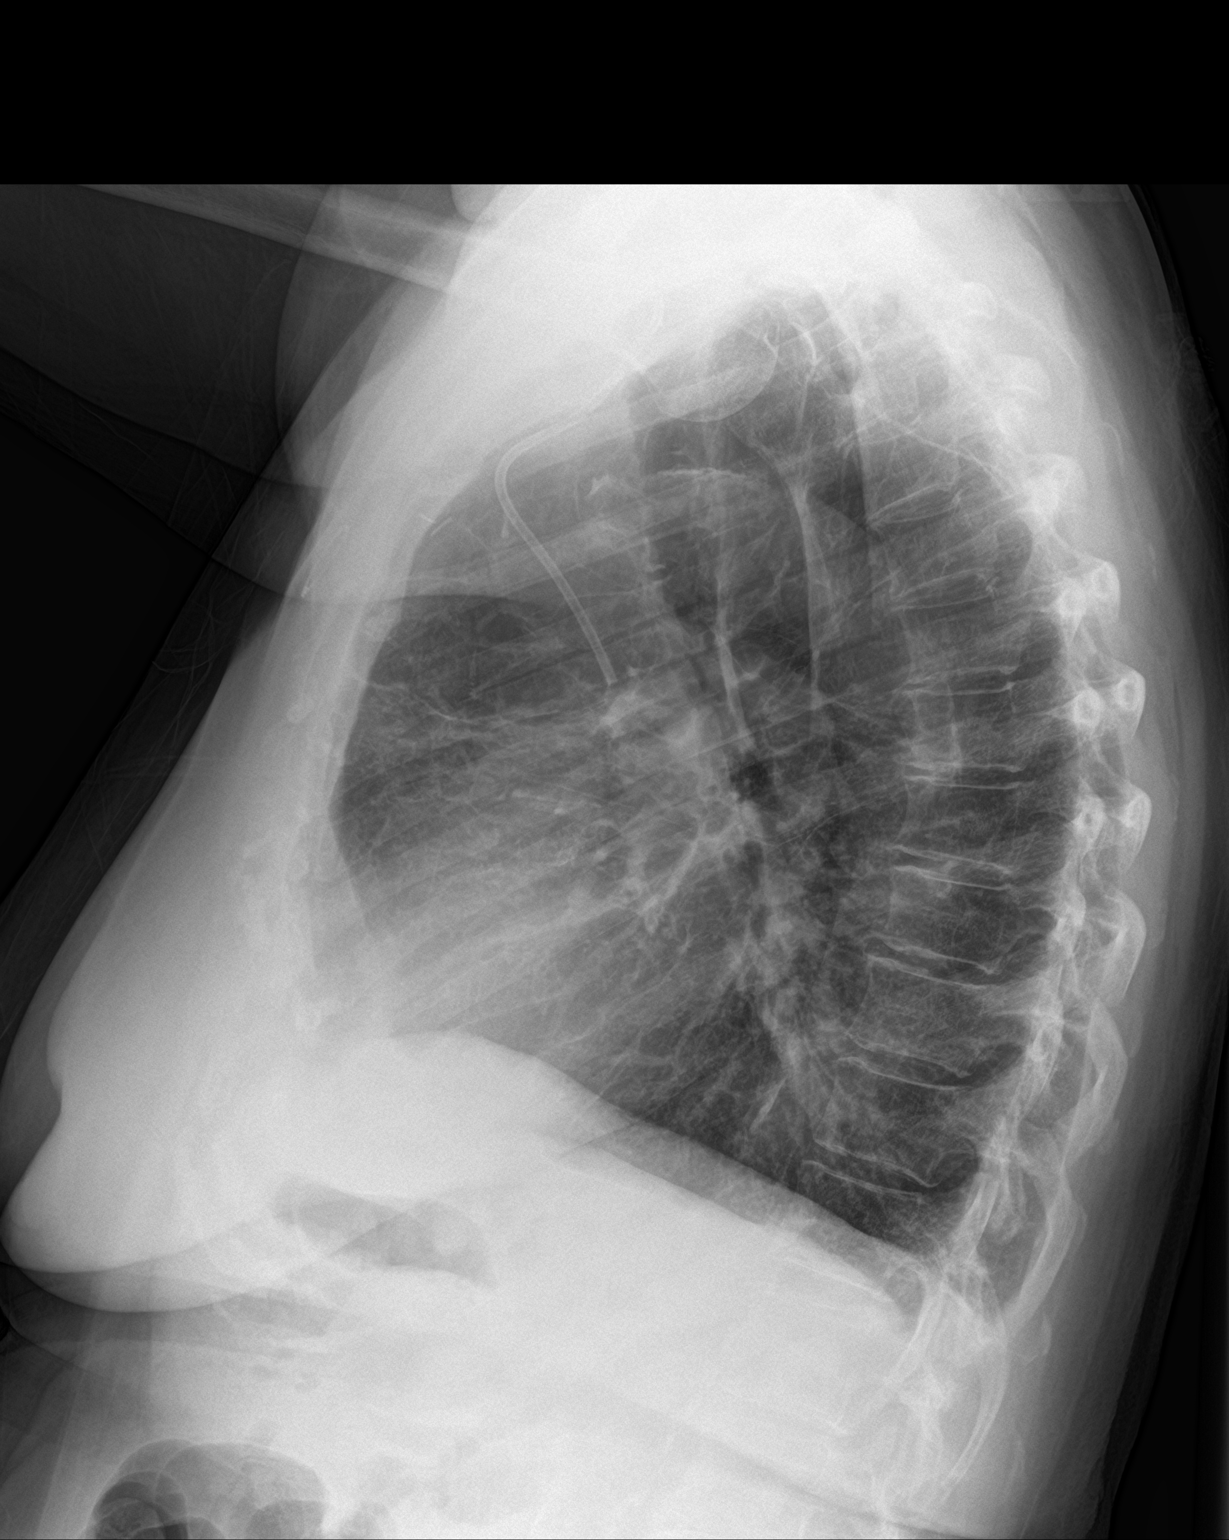

[2 of 2 positions shown; findings below may reference images not displayed]

FINDINGS: Cardiac shadows within normal limits. Small sliding-type hiatal
hernia is again noted and stable. Diffuse emphysematous changes are
seen bilaterally. Some crowding of the vascular markings is noted
along the minor fissure on the right. No sizable effusion or
infiltrate is seen. No bony abnormality is noted.
IMPRESSION: Chronic changes of COPD without acute abnormality.

## 2018-08-06 ENCOUNTER — Other Ambulatory Visit: Payer: Medicare Other

## 2018-08-13 ENCOUNTER — Other Ambulatory Visit: Payer: Self-pay

## 2018-08-13 ENCOUNTER — Inpatient Hospital Stay: Payer: Medicare Other | Attending: Oncology

## 2018-08-13 DIAGNOSIS — Z95828 Presence of other vascular implants and grafts: Secondary | ICD-10-CM | POA: Diagnosis present

## 2018-08-13 MED ORDER — SODIUM CHLORIDE 0.9% FLUSH
10.0000 mL | Freq: Once | INTRAVENOUS | Status: AC
  Administered 2018-08-13: 10 mL via INTRAVENOUS
  Filled 2018-08-13: qty 10

## 2018-08-13 MED ORDER — HEPARIN SOD (PORK) LOCK FLUSH 100 UNIT/ML IV SOLN
500.0000 [IU] | Freq: Once | INTRAVENOUS | Status: AC
  Administered 2018-08-13: 500 [IU] via INTRAVENOUS

## 2018-08-28 ENCOUNTER — Other Ambulatory Visit: Payer: Self-pay

## 2018-08-28 ENCOUNTER — Ambulatory Visit (INDEPENDENT_AMBULATORY_CARE_PROVIDER_SITE_OTHER): Payer: Medicare Other

## 2018-08-28 DIAGNOSIS — I255 Ischemic cardiomyopathy: Secondary | ICD-10-CM | POA: Diagnosis not present

## 2018-09-17 ENCOUNTER — Other Ambulatory Visit: Payer: Medicare Other

## 2018-09-24 ENCOUNTER — Other Ambulatory Visit: Payer: Self-pay

## 2018-09-24 ENCOUNTER — Inpatient Hospital Stay: Payer: Medicare Other | Attending: Oncology

## 2018-09-24 DIAGNOSIS — Z853 Personal history of malignant neoplasm of breast: Secondary | ICD-10-CM | POA: Insufficient documentation

## 2018-09-24 DIAGNOSIS — Z452 Encounter for adjustment and management of vascular access device: Secondary | ICD-10-CM | POA: Insufficient documentation

## 2018-09-24 DIAGNOSIS — Z95828 Presence of other vascular implants and grafts: Secondary | ICD-10-CM

## 2018-09-24 MED ORDER — HEPARIN SOD (PORK) LOCK FLUSH 100 UNIT/ML IV SOLN
500.0000 [IU] | Freq: Once | INTRAVENOUS | Status: AC
  Administered 2018-09-24: 500 [IU] via INTRAVENOUS

## 2018-09-24 MED ORDER — SODIUM CHLORIDE 0.9% FLUSH
10.0000 mL | Freq: Once | INTRAVENOUS | Status: AC
  Administered 2018-09-24: 14:00:00 10 mL via INTRAVENOUS
  Filled 2018-09-24: qty 10

## 2018-11-04 ENCOUNTER — Other Ambulatory Visit: Payer: Self-pay

## 2018-11-05 ENCOUNTER — Inpatient Hospital Stay: Payer: Medicare Other | Attending: Oncology

## 2018-11-05 ENCOUNTER — Other Ambulatory Visit: Payer: Self-pay

## 2018-11-05 DIAGNOSIS — Z853 Personal history of malignant neoplasm of breast: Secondary | ICD-10-CM | POA: Diagnosis not present

## 2018-11-05 DIAGNOSIS — Z95828 Presence of other vascular implants and grafts: Secondary | ICD-10-CM

## 2018-11-05 DIAGNOSIS — Z452 Encounter for adjustment and management of vascular access device: Secondary | ICD-10-CM | POA: Insufficient documentation

## 2018-11-05 MED ORDER — SODIUM CHLORIDE 0.9% FLUSH
10.0000 mL | Freq: Once | INTRAVENOUS | Status: AC
  Administered 2018-11-05: 10 mL via INTRAVENOUS
  Filled 2018-11-05: qty 10

## 2018-11-05 MED ORDER — HEPARIN SOD (PORK) LOCK FLUSH 100 UNIT/ML IV SOLN
500.0000 [IU] | Freq: Once | INTRAVENOUS | Status: AC
  Administered 2018-11-05: 500 [IU] via INTRAVENOUS

## 2018-11-30 NOTE — Progress Notes (Signed)
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  INTERVAL HISTORY: Patient returns to clinic today for routine 81-monthevaluation.  She continues to tolerate letrozole without significant side effects.  She currently feels well and is asymptomatic.  She does not complain of weakness or fatigue today.  She has no neurologic complaints. She denies any recent fevers or illnesses.  She denies any chest pain, shortness of breath, cough, or hemoptysis.  She denies any nausea, vomiting, constipation, or diarrhea.  She has no urinary complaints.  Patient feels at her baseline offers no specific complaints today.  REVIEW OF SYSTEMS:   Review of Systems  Constitutional: Negative.  Negative for fever, malaise/fatigue and weight loss.  Respiratory: Negative.  Negative for cough and shortness of breath.   Cardiovascular: Negative.  Negative for chest pain and leg swelling.  Gastrointestinal: Negative.  Negative for abdominal pain, constipation, diarrhea and nausea.  Genitourinary: Negative.  Negative for dysuria.  Musculoskeletal: Negative.  Negative for joint pain.  Skin: Negative.  Negative for rash.  Neurological: Negative.  Negative for sensory change, focal weakness, weakness and headaches.  Psychiatric/Behavioral: Negative.  The patient is not nervous/anxious.     As per HPI. Otherwise, a complete review of systems is negative.  PAST MEDICAL HISTORY: Past  Medical History:  Diagnosis Date  . Anxiety   . Arthritis   . CAD (coronary artery disease)    a. NSTEMI 12/19; b. LHC 04/10/18: pLAD 95%, mLAD 80%, m-dLCx 95%, OM3-1 lesion 40%, OM3-2 lesion 60%, mRCA 50%, EF 35-45%, successful PCI/DES x 2 to the LAD with recommended staged PCI of the LCx in a few weeks  . Cancer ([facility]    Right Breast Cancer  . Chronic systolic CHF (congestive heart failure) ([place]    a. TTE 12/19: EF 30-35%, anteroseptal, anterior, and apical HK, Gr1DD, mild to mod MR, mildly dilated LA, RVSF nl  . COPD (chronic obstructive pulmonary disease) ([place]   . Depression   . Dyspnea    with exertion  . GERD (gastroesophageal reflux disease)   . Hyperlipidemia   . Hypertension   . Personal history of chemotherapy   . Personal history of radiation therapy     PAST SURGICAL HISTORY: Past Surgical History:  Procedure Laterality Date  . ABDOMINAL HYSTERECTOMY  1990   Partial  . BREAST BIOPSY Right 10/04/2016   axilla lymph node and axillay tail mass biopsy. invasive mammary carcinoma  . BREAST LUMPECTOMY Right 03/21/2017  . BREAST LUMPECTOMY Right 05/21/2017   re-excision  . BREAST LUMPECTOMY WITH NEEDLE LOCALIZATION Right 03/21/2017   Procedure: BREAST LUMPECTOMY WITH NEEDLE LOCALIZATION;  Surgeon: WClayburn Pert MD;  Location: [facility] OR[facility];  Service: General;  Laterality: Right;  . CORONARY STENT INTERVENTION N/A 04/10/2018   Procedure: CORONARY STENT INTERVENTION;  Surgeon: AWellington Hampshire MD;  [place];  Service: Cardiovascular;  Laterality: N/A;  . CORONARY STENT INTERVENTION N/A 05/14/2018   Procedure: CORONARY STENT INTERVENTION;  Surgeon: ENelva Bush MD;  [place] [facility];  Service: Cardiovascular;  Laterality: N/A;  . DILATION AND CURETTAGE OF UTERUS    . INGUINAL HERNIA REPAIR Right 03/26/2017   Procedure: HERNIA REPAIR INGUINAL INCARCERATED;  Surgeon: Jules Husbands, MD;  Location: [facility];  Service: General;   Laterality: Right;  . LEFT HEART CATH AND CORONARY ANGIOGRAPHY N/A 04/10/2018   Procedure: LEFT HEART CATH AND CORONARY ANGIOGRAPHY poss PCI;  Surgeon: Minna Merritts, MD;  Location: [place] [facility];  Service: Cardiovascular;  Laterality: N/A;  . PORTACATH PLACEMENT Left 10/24/2016   Procedure: INSERTION PORT-A-CATH;  Surgeon: [name], MD;  Location: [facility];  Service: General;  Laterality: Left;  . RE-EXCISION OF BREAST LUMPECTOMY Right 05/21/2017   Procedure: RE-EXCISION OF BREAST LUMPECTOMY;  Surgeon: Clayburn Pert, MD;  Location: [facility];  Service: General;  Laterality: Right;  . SENTINEL NODE BIOPSY Right 03/21/2017   Procedure: SENTINEL NODE BIOPSY;  Surgeon: Clayburn Pert, MD;  Location: [facility];  Service: General;  Laterality: Right;    FAMILY HISTORY: Family History  Problem Relation Age of Onset  . Colon cancer Mother   . Breast cancer Neg Hx     ADVANCED DIRECTIVES (Y/N):  N  HEALTH MAINTENANCE: Social History   Tobacco Use  . Smoking status: Former Smoker    Packs/day: 0.50    Types: Cigarettes    Quit date: 07/23/1988    Years since quitting: 30.3  . Smokeless tobacco: Never Used  Substance Use Topics  . Alcohol use: No  . Drug use: No     Colonoscopy:  PAP:  Bone density:  Lipid panel:  Allergies  Allergen Reactions  . Nsaids     Bleeding risk  . Penicillins Anaphylaxis, Swelling and Rash    Has patient had a PCN reaction causing immediate rash, facial/tongue/throat swelling, SOB or lightheadedness with hypotension: Yes Has patient had a PCN reaction causing severe rash involving mucus membranes or skin necrosis: No Has patient had a PCN reaction that required hospitalization: No  Has patient had a PCN reaction occurring within the last 10 years: No If all of the above answers are "NO", then may proceed with Cephalosporin use.   . Atorvastatin Other (See Comments)    Muscle Pain  . Gabapentin Swelling  . Ezetimibe Other (See  Comments)    Muscle pain  . Aspirin     GI bleeding risk    Current Outpatient Medications  Medication Sig Dispense Refill  . ADVAIR DISKUS 500-50 MCG/DOSE AEPB Inhale 1 puff into the lungs 2 (two) times daily.     Marland Kitchen albuterol (PROVENTIL HFA;VENTOLIN HFA) 108 (90 Base) MCG/ACT inhaler Inhale 2 puffs into the lungs every 6 (six) hours as needed for wheezing or shortness of breath.    Marland Kitchen aspirin EC 81 MG tablet Take 81 mg by mouth daily.    . baclofen (LIORESAL) 10 MG tablet Take 10 mg by mouth at bedtime.  9  . Calcium-Vitamin D-Vitamin K 811-914-78 MG-UNT-MCG TABS Take 1 tablet by mouth daily.    . carvedilol (COREG) 6.25 MG tablet Take 1 tablet (6.25 mg total) by mouth 2 (two) times daily. 180 tablet 3  . cholecalciferol (VITAMIN D3) 25 MCG (1000 UT) tablet Take 1,000 Units by mouth daily.    . clopidogrel (PLAVIX) 75 MG tablet Take 1 tablet (75 mg total) by mouth daily with breakfast. 90 tablet 4  . Coenzyme Q10 300 MG CAPS Take 300 mg by mouth daily.     . DULoxetine (CYMBALTA) 30 MG capsule Take 30 mg by  mouth every morning.   11  . DULoxetine (CYMBALTA) 60 MG capsule Take 60 mg by mouth daily.   11  . enalapril (VASOTEC) 5 MG tablet Take 1 tablet (5 mg total) by mouth daily. 90 tablet 3  . furosemide (LASIX) 20 MG tablet Take 1 tablet (20 mg total) by mouth as needed for edema. 30 tablet 11  . KLOR-CON M20 20 MEQ tablet TAKE 1 TABLET BY MOUTH EVERY DAY 60 tablet 2  . letrozole (FEMARA) 2.5 MG tablet TAKE 1 TABLET BY MOUTH EVERY DAY 90 tablet 3  . lidocaine-prilocaine (EMLA) cream Apply 1 application as needed topically (for port access).    . magnesium oxide (MAG-OX) 400 MG tablet Take 400 mg by mouth daily.    . Melatonin 5 MG TABS Take 10 mg by mouth at bedtime as needed (sleep).    . montelukast (SINGULAIR) 10 MG tablet Take 10 mg by mouth at bedtime.     . pantoprazole (PROTONIX) 40 MG tablet Take 1 tablet (40 mg total) by mouth daily. 90 tablet 3  . rosuvastatin (CRESTOR) 20 MG  tablet Take 1 tablet (20 mg total) by mouth daily at 6 PM. 90 tablet 4  . spironolactone (ALDACTONE) 25 MG tablet Take 0.5 tablets (12.5 mg total) by mouth daily. 45 tablet 3   No current facility-administered medications for this visit.    Facility-Administered Medications Ordered in Other Visits  Medication Dose Route Frequency Provider Last Rate Last Dose  . 0.9 %  sodium chloride infusion   Intravenous Once Lloyd Huger, MD      . 0.9 %  sodium chloride infusion   Intravenous Once Lloyd Huger, MD      . 0.9 %  sodium chloride infusion   Intravenous Once Lloyd Huger, MD      . 0.9 %  sodium chloride infusion   Intravenous Continuous Grayland Ormond, Kathlene November, MD      . heparin lock flush 100 unit/mL  500 Units Intravenous Once Lloyd Huger, MD      . ondansetron [facility]) 8 mg in sodium chloride 0.9 % 50 mL IVPB   Intravenous Once Lloyd Huger, MD      . sodium chloride flush (NS) 0.9 % injection 10 mL  10 mL Intravenous Once Lloyd Huger, MD        OBJECTIVE: Vitals:   12/02/18 1428  BP: (!) 146/82  Pulse: 80  Resp: 20     Body mass index is 26.34 kg/m.    ECOG FS:0 - Asymptomatic  General: Well-developed, well-nourished, no acute distress. Eyes: Pink conjunctiva, anicteric sclera. HEENT: Normocephalic, moist mucous membrane. Breast: Bilateral breast and axilla without lumps or masses. Lungs: Clear to auscultation bilaterally. Heart: Regular rate and rhythm. No rubs, murmurs, or gallops. Abdomen: Soft, nontender, nondistended. No organomegaly noted, normoactive bowel sounds. Musculoskeletal: No edema, cyanosis, or clubbing. Neuro: Alert, answering all questions appropriately. Cranial nerves grossly intact. Skin: No rashes or petechiae noted. Psych: Normal affect.  LAB RESULTS:  Lab Results  Component Value Date   NA 139 05/15/2018   K 3.6 05/15/2018   CL 108 05/15/2018   CO2 26 05/15/2018   GLUCOSE 102 (H) 05/15/2018   BUN 18  05/15/2018   CREATININE 1.24 (H) 05/15/2018   CALCIUM 8.4 (L) 05/15/2018   PROT 7.3 03/12/2018   ALBUMIN 3.5 03/12/2018   AST 24 03/12/2018   ALT 14 03/12/2018   ALKPHOS 60 03/12/2018   BILITOT 0.7 03/12/2018  GFRNONAA 42 (L) 05/15/2018   GFRAA 49 (L) 05/15/2018    Lab Results  Component Value Date   WBC 8.7 05/15/2018   NEUTROABS 5.9 05/09/2018   HGB 8.9 (L) 05/15/2018   HCT 30.2 (L) 05/15/2018   MCV 84.1 05/15/2018   PLT 331 05/15/2018     STUDIES: No results found.  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  PLAN:    1. 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  Treatment was discontinued secondary to declining performance status.  Patient underwent lumpectomy on March 21, 2017, but no biopsy clip was seen in her surgical specimen.  She had repeat surgery on May 21, 2017 to remove her retained clip, which confirmed a complete pathological response. Patient completed her adjuvant XRT on September 27, 2017.  Continue letrozole for a total of 5 years completing in February 2024.  Patient's most recent mammogram on Sep 13, 2017 was reported as BI-RADS 2.  Her scheduled mammogram was delayed secondary to COVID-19 and has been rescheduled for December 18, 2018.  Return to clinic in 6 months for routine evaluation.   3. Hypokalemia: Resolved.  Continue 20 mEq oral potassium supplementation daily. 4. Anemia: Patient's most recent hemoglobin was 8.9 in January 2020. 5.  Bone health: Patient had a bone mineral density on July 12, 2017 reported T score of -0.5.  This is considered normal.  Continue calcium and vitamin D supplementation.  Repeat in March 2021. 6.  Cardiac disease: Patient now has 3 stents placed.  Continue follow-up and treatment per  cardiology.  I spent a total of 20 minutes face-to-face with the patient of which greater than 50% of the visit was spent in counseling and coordination of care as detailed above.  Patient expressed understanding and was in agreement with this plan. She also understands that She can call clinic at any time with any questions, concerns, or complaints.   Cancer Staging Malignant neoplasm of right female breast [facility]) Staging form: Breast, AJCC 8th Edition - Clinical stage from 10/16/2016: Stage IIB (cT2, cN1, cM0, G3, ER: Negative, PR: Positive, HER2: Positive) - Signed by Lloyd Huger, MD on 10/16/2016   Lloyd Huger, MD   12/03/2018 11:56 AM

## 2018-12-02 ENCOUNTER — Inpatient Hospital Stay: Payer: Medicare Other | Attending: Oncology

## 2018-12-02 ENCOUNTER — Other Ambulatory Visit: Payer: Self-pay

## 2018-12-02 ENCOUNTER — Inpatient Hospital Stay (HOSPITAL_BASED_OUTPATIENT_CLINIC_OR_DEPARTMENT_OTHER): Payer: Medicare Other | Admitting: Oncology

## 2018-12-02 ENCOUNTER — Encounter: Payer: Self-pay | Admitting: Oncology

## 2018-12-02 VITALS — BP 146/82 | HR 80 | Resp 20 | Wt 144.0 lb

## 2018-12-02 DIAGNOSIS — D649 Anemia, unspecified: Secondary | ICD-10-CM | POA: Insufficient documentation

## 2018-12-02 DIAGNOSIS — Z79811 Long term (current) use of aromatase inhibitors: Secondary | ICD-10-CM | POA: Insufficient documentation

## 2018-12-02 DIAGNOSIS — Z171 Estrogen receptor negative status [ER-]: Secondary | ICD-10-CM | POA: Diagnosis not present

## 2018-12-02 DIAGNOSIS — C50411 Malignant neoplasm of upper-outer quadrant of right female breast: Secondary | ICD-10-CM | POA: Diagnosis not present

## 2018-12-02 DIAGNOSIS — Z452 Encounter for adjustment and management of vascular access device: Secondary | ICD-10-CM | POA: Insufficient documentation

## 2018-12-02 MED ORDER — SODIUM CHLORIDE 0.9% FLUSH
10.0000 mL | Freq: Once | INTRAVENOUS | Status: DC
  Filled 2018-12-02: qty 10

## 2018-12-02 MED ORDER — HEPARIN SOD (PORK) LOCK FLUSH 100 UNIT/ML IV SOLN: 500.0000 [IU] | Freq: Once | INTRAVENOUS | Status: AC

## 2018-12-02 NOTE — Progress Notes (Signed)
Patient denies any concerns today.  

## 2018-12-18 ENCOUNTER — Ambulatory Visit
Admission: RE | Admit: 2018-12-18 | Discharge: 2018-12-18 | Disposition: A | Discharge status: Home / Self Care | Payer: Medicare Other | Source: Ambulatory Visit | Attending: Oncology | Admitting: Oncology

## 2018-12-18 DIAGNOSIS — C50411 Malignant neoplasm of upper-outer quadrant of right female breast: Secondary | ICD-10-CM

## 2018-12-18 DIAGNOSIS — Z171 Estrogen receptor negative status [ER-]: Secondary | ICD-10-CM | POA: Diagnosis present

## 2018-12-18 IMAGING — MG DIGITAL DIAGNOSTIC BILATERAL MAMMOGRAM WITH TOMO AND CAD
8 of 12 series · 8 of 32 positions shown · non-contrast
Comparison: Previous exam(s).

CLINICAL DATA: History of right breast cancer status post
lumpectomy in [11].

EXAM:
DIGITAL DIAGNOSTIC BILATERAL MAMMOGRAM WITH CAD AND TOMO

[R MLO (1 of 2)]
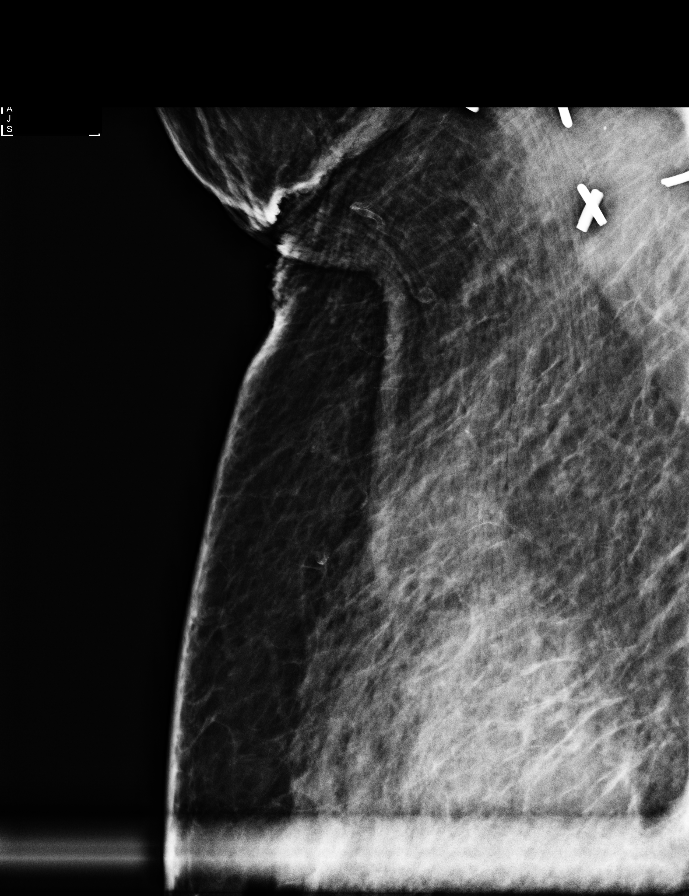

[R MLO (2 of 2)]
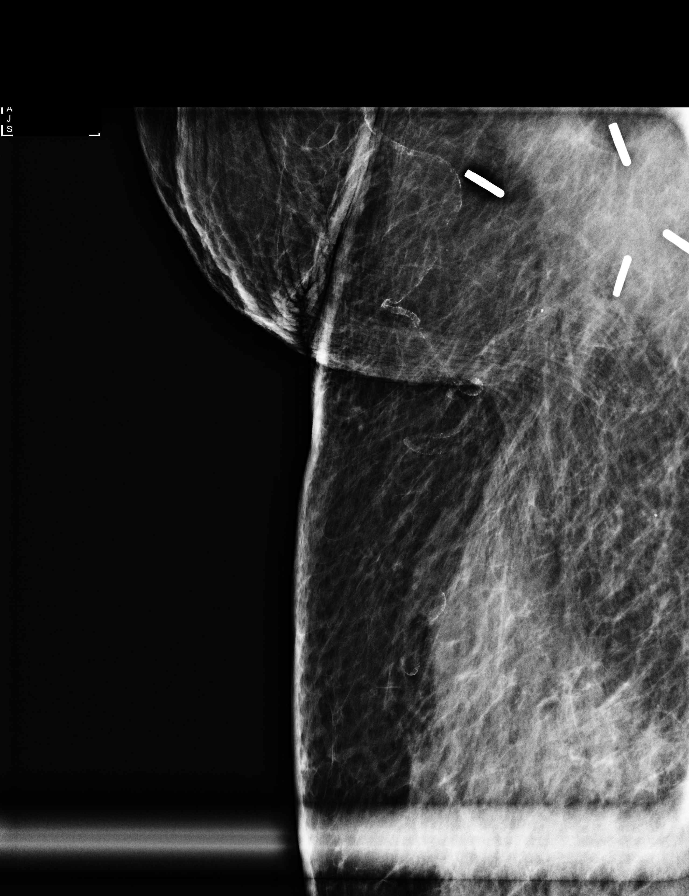

[R MLO synth-2D (1 of 2)]
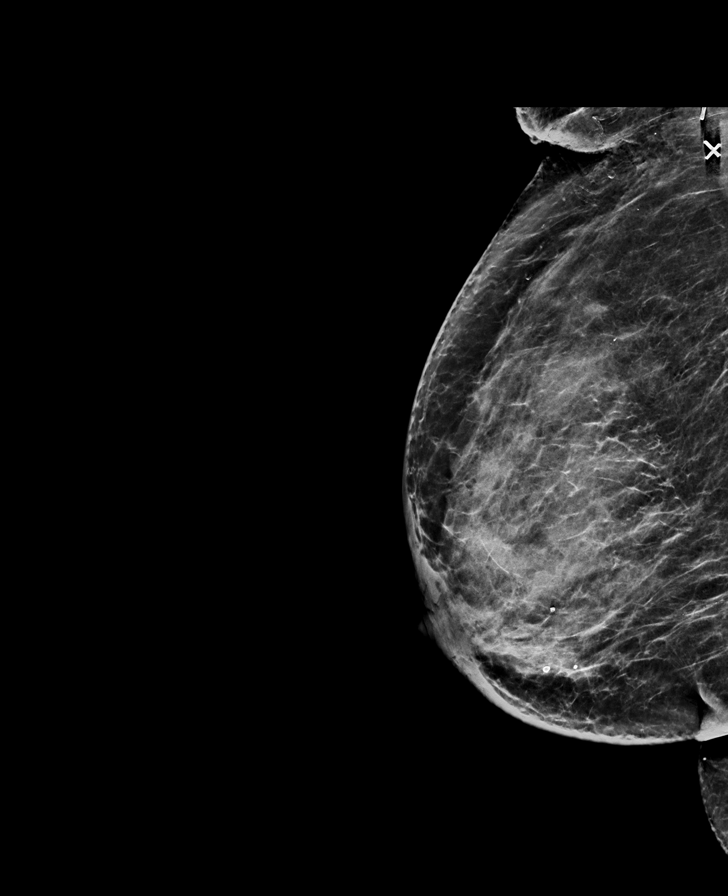

[L MLO synth-2D]
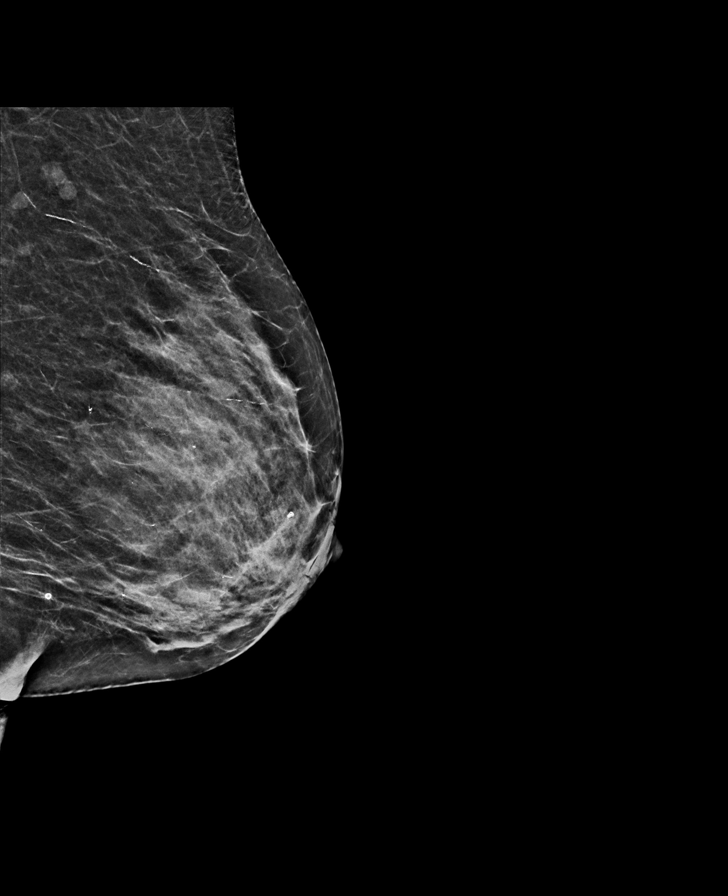

[R CC synth-2D]
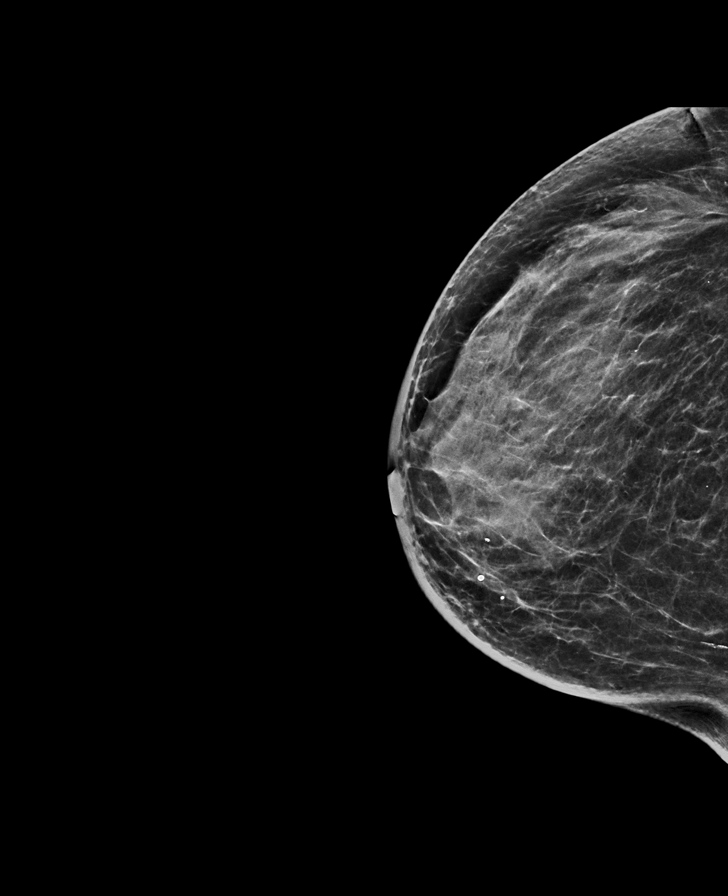

[R MLO synth-2D (2 of 2)]
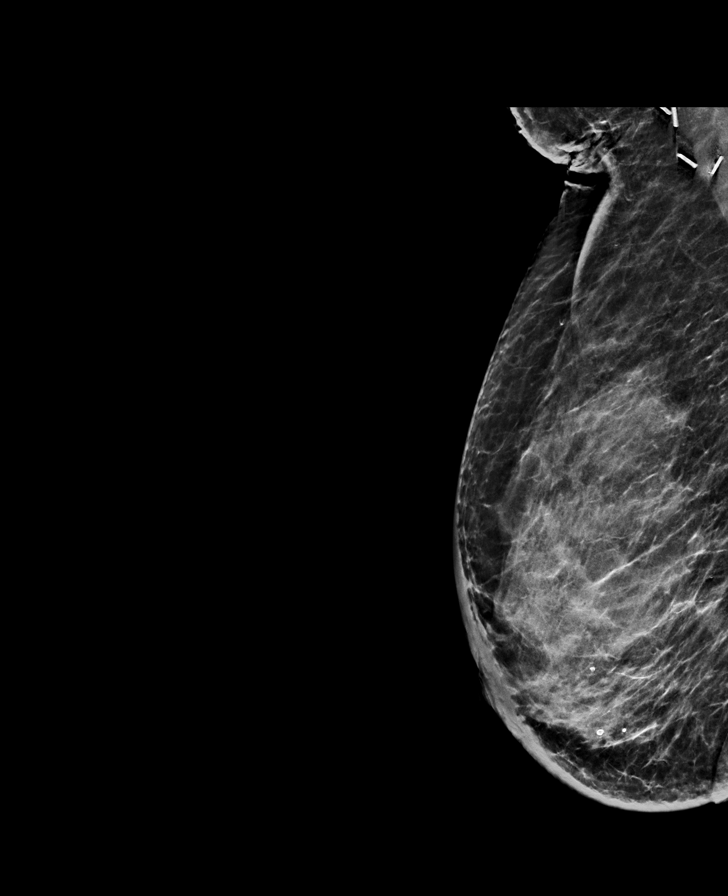

[L CC synth-2D]
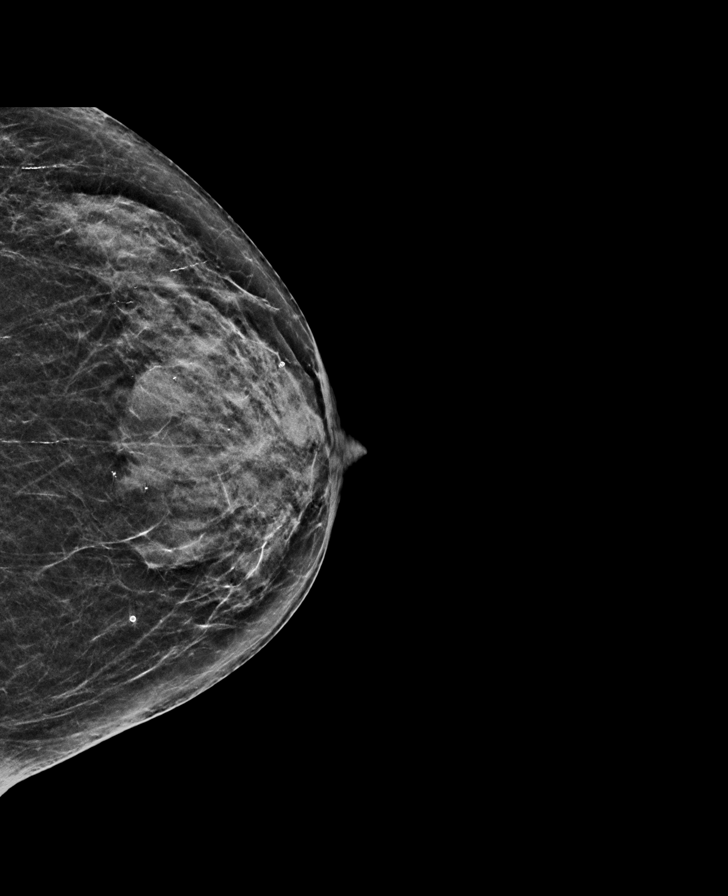

[R MLO tomo · tomo slice 31/62.0]
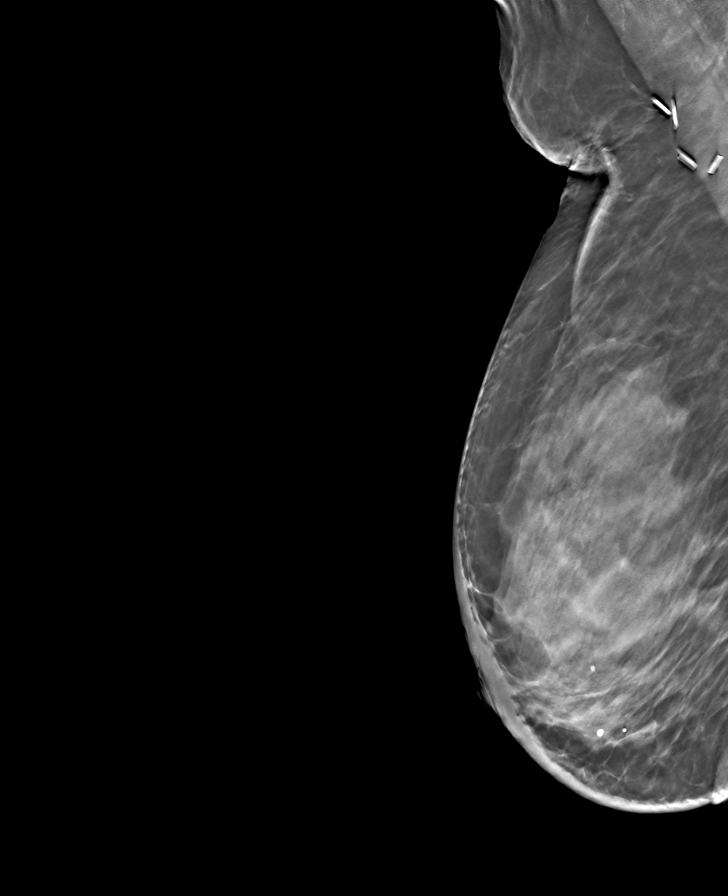

[8 of 32 positions shown; findings below may reference images not displayed]

ACR Breast Density Category c: The breast tissue is heterogeneously
dense, which may obscure small masses.
FINDINGS: Stable lumpectomy changes are seen in the right breast. No
suspicious mass or malignant type microcalcifications identified in
either breast.

Mammographic images were processed with CAD.
IMPRESSION: No evidence of malignancy in either breast.

RECOMMENDATION:
Bilateral diagnostic mammogram in 1 year is recommended.

I have discussed the findings and recommendations with the patient.
Results were also provided in writing at the conclusion of the
visit. If applicable, a reminder letter will be sent to the patient
regarding the next appointment.

BI-RADS CATEGORY  2: Benign.

## 2018-12-23 ENCOUNTER — Other Ambulatory Visit: Payer: Self-pay

## 2018-12-23 ENCOUNTER — Inpatient Hospital Stay: Payer: Medicare Other

## 2018-12-23 DIAGNOSIS — Z95828 Presence of other vascular implants and grafts: Secondary | ICD-10-CM

## 2018-12-23 DIAGNOSIS — Z79811 Long term (current) use of aromatase inhibitors: Secondary | ICD-10-CM | POA: Diagnosis not present

## 2018-12-23 DIAGNOSIS — Z171 Estrogen receptor negative status [ER-]: Secondary | ICD-10-CM | POA: Diagnosis not present

## 2018-12-23 DIAGNOSIS — C50411 Malignant neoplasm of upper-outer quadrant of right female breast: Secondary | ICD-10-CM | POA: Diagnosis present

## 2018-12-23 DIAGNOSIS — Z452 Encounter for adjustment and management of vascular access device: Secondary | ICD-10-CM | POA: Diagnosis not present

## 2018-12-23 DIAGNOSIS — D649 Anemia, unspecified: Secondary | ICD-10-CM | POA: Diagnosis not present

## 2018-12-23 MED ORDER — SODIUM CHLORIDE 0.9% FLUSH
10.0000 mL | Freq: Once | INTRAVENOUS | Status: AC
  Administered 2018-12-23: 10 mL via INTRAVENOUS
  Filled 2018-12-23: qty 10

## 2018-12-23 MED ORDER — HEPARIN SOD (PORK) LOCK FLUSH 100 UNIT/ML IV SOLN
500.0000 [IU] | Freq: Once | INTRAVENOUS | Status: AC
  Administered 2018-12-23: 500 [IU] via INTRAVENOUS
  Filled 2018-12-23: qty 5

## 2019-01-09 NOTE — Progress Notes (Deleted)
Cardiology Office Note  Date:  01/09/2019   ID:  Krista Cisneros, DOB 1941/03/24, MRN [id number]  PCP:  Sharyne Peach, MD   No chief complaint on file.   HPI: [name] is a 78 y.o. 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 abuse quitting in 1990,  HTN,  HLD,  breast cancer s/p chemoradiation,  GERD admission to [facility] from 04/09/18 to 04/12/18 for NSTEMI, successful PCI/DES x 2 to the LAD at that time,  cardiac catheterization with stent to the left circumflex  INTERVAL HISTORY: She reports today for hospital follow up She is accompanied by her daughter. On 05/14/2018 she was admitted for NSTEMI along with chest pain and in worsening fatigue.   Recent cardiac catheterization results detailed below, discussed with her 2 stents to the LAD, staged procedure with follow-up stent to the left circumflex  In the near future she will be attending cardiac rehab.  Denies any anginal symptoms, has not started a regular walking program  She is hoping to remove the port (used by oncology ) in the future.   EKG personally reviewed by me today  Showing normal sinus rhythm, T wave abnormality, consider lateral ischemia  OTHER PAST MEDICAL HISTORY REVIEWED BY ME FOR TODAY'S VISIT: cath 04/10/18 Multivessel disease Severe mid LAD disease 95%, mid to distal stenosis as well 80% Proximal OM 3 disease large vessel 95% Moderate RCA disease 50% mid vessel  Successful angioplasty and drug-eluting stent placement to the LAD x2.  staged cath  Conclusions: 1. 90% mid LCx stenosis, unchanged from catheterization last month. 2. Widely patent proximal and mid LAD stents. 3. Successful PCI to mid LCx using Resolute Onyx 2.25 x 15 mm drug-eluting stent (postdilated to 2.6 mm) with 0% residual stenosis and TIMI-3 flow.   Ejection fraction of 30 to 35% by echocardiogram April 10, 2018  PMH:   has a past medical history of Anxiety, Arthritis, CAD (coronary artery disease), Cancer ([employer]), Chronic systolic CHF (congestive heart failure) ([place]), COPD (chronic obstructive pulmonary disease) ([place]), Depression, Dyspnea, GERD (gastroesophageal reflux disease), Hyperlipidemia, Hypertension, Personal history of chemotherapy (2018), and Personal history of radiation therapy (2019).  PSH:    Past Surgical History:  Procedure Laterality Date  . ABDOMINAL HYSTERECTOMY  1990   Partial  . BREAST BIOPSY Right 10/04/2016   axilla lymph node (metastatic carcinoma) and axillay tail mass biopsy-invasive mammary carcinoma  . BREAST LUMPECTOMY Right 03/21/2017   chemo first, [facility] and metastatic LN  . BREAST LUMPECTOMY Right 05/21/2017   re-excision for clip  . BREAST LUMPECTOMY WITH NEEDLE LOCALIZATION Right 03/21/2017   Procedure: BREAST LUMPECTOMY WITH NEEDLE LOCALIZATION;  Surgeon: Clayburn Pert, MD;  Location: [facility] ORS;  Service: General;  Laterality: Right;  . CORONARY STENT INTERVENTION N/A 04/10/2018   Procedure: CORONARY STENT INTERVENTION;  Surgeon: Wellington Hampshire, MD;  Location: [place] [facility];  Service: Cardiovascular;  Laterality: N/A;  . CORONARY STENT INTERVENTION N/A 05/14/2018   Procedure: CORONARY STENT INTERVENTION;  Surgeon: Nelva Bush, MD;  Location: [place] [facility];  Service: Cardiovascular;  Laterality: N/A;  . DILATION AND CURETTAGE OF UTERUS    . INGUINAL HERNIA REPAIR Right 03/26/2017   Procedure: HERNIA REPAIR INGUINAL INCARCERATED;  Surgeon: Jules Husbands, MD;  Location: [facility];  Service: General;  Laterality: Right;  . LEFT HEART CATH AND CORONARY ANGIOGRAPHY N/A 04/10/2018  Procedure: LEFT HEART CATH AND CORONARY ANGIOGRAPHY poss PCI;  Surgeon: Minna Merritts, MD;  Location: [place] [facility];  Service: Cardiovascular;  Laterality: N/A;  . PORTACATH PLACEMENT Left 10/24/2016   Procedure: INSERTION PORT-A-CATH;  Surgeon: [name], MD;  Location: [facility];  Service: General;  Laterality: Left;  . RE-EXCISION OF BREAST LUMPECTOMY Right 05/21/2017   Procedure: RE-EXCISION OF BREAST LUMPECTOMY;  Surgeon: Clayburn Pert, MD;  Location: [facility];  Service: General;  Laterality: Right;  . SENTINEL NODE BIOPSY Right 03/21/2017   Procedure: SENTINEL NODE BIOPSY;  Surgeon: Clayburn Pert, MD;  Location: [facility];  Service: General;  Laterality: Right;    Current Outpatient Medications  Medication Sig Dispense Refill  . ADVAIR DISKUS 500-50 MCG/DOSE AEPB Inhale 1 puff into the lungs 2 (two) times daily.     Marland Kitchen albuterol (PROVENTIL HFA;VENTOLIN HFA) 108 (90 Base) MCG/ACT inhaler Inhale 2 puffs into the lungs every 6 (six) hours as needed for wheezing or shortness of breath.    Marland Kitchen aspirin EC 81 MG tablet Take 81 mg by mouth daily.    . baclofen (LIORESAL) 10 MG tablet Take 10 mg by mouth at bedtime.  9  . Calcium-Vitamin D-Vitamin K [id number] MG-UNT-MCG TABS Take 1 tablet by mouth daily.    . carvedilol (COREG) 6.25 MG tablet Take 1 tablet (6.25 mg total) by mouth 2 (two) times daily. 180 tablet 3  . cholecalciferol (VITAMIN D3) 25 MCG (1000 UT) tablet Take 1,000 Units by mouth daily.    . clopidogrel (PLAVIX) 75 MG tablet Take 1 tablet (75 mg total) by mouth daily with breakfast. 90 tablet 4  . Coenzyme Q10 300 MG CAPS Take 300 mg by mouth daily.     . DULoxetine (CYMBALTA) 30 MG capsule Take 30 mg by mouth every morning.   11  . DULoxetine (CYMBALTA) 60 MG capsule Take 60 mg by mouth daily.   11  . enalapril (VASOTEC) 5 MG tablet Take 1 tablet (5 mg total) by mouth daily. 90 tablet 3  . furosemide (LASIX) 20 MG tablet Take 1 tablet (20 mg total) by mouth as needed for edema. 30 tablet 11  . KLOR-CON M20 20 MEQ tablet TAKE 1 TABLET BY MOUTH EVERY DAY 60 tablet 2  . letrozole (FEMARA) 2.5 MG tablet TAKE 1 TABLET BY MOUTH EVERY DAY 90 tablet 3  . lidocaine-prilocaine (EMLA) cream Apply 1 application as needed topically  (for port access).    . magnesium oxide (MAG-OX) 400 MG tablet Take 400 mg by mouth daily.    . Melatonin 5 MG TABS Take 10 mg by mouth at bedtime as needed (sleep).    . montelukast (SINGULAIR) 10 MG tablet Take 10 mg by mouth at bedtime.     . pantoprazole (PROTONIX) 40 MG tablet Take 1 tablet (40 mg total) by mouth daily. 90 tablet 3  . rosuvastatin (CRESTOR) 20 MG tablet Take 1 tablet (20 mg total) by mouth daily at 6 PM. 90 tablet 4  . spironolactone (ALDACTONE) 25 MG tablet Take 0.5 tablets (12.5 mg total) by mouth daily. 45 tablet 3   No current facility-administered medications for this visit.    Facility-Administered Medications Ordered in Other Visits  Medication Dose Route Frequency Provider Last Rate Last Dose  . 0.9 %  sodium chloride infusion   Intravenous Once Lloyd Huger, MD      . 0.9 %  sodium chloride infusion   Intravenous Once Lloyd Huger, MD      .  0.9 %  sodium chloride infusion   Intravenous Once Lloyd Huger, MD      . 0.9 %  sodium chloride infusion   Intravenous Continuous Grayland Ormond, Kathlene November, MD      . heparin lock flush 100 unit/mL  500 Units Intravenous Once Lloyd Huger, MD      . ondansetron [facility]) 8 mg in sodium chloride 0.9 % 50 mL IVPB   Intravenous Once Lloyd Huger, MD      . sodium chloride flush (NS) 0.9 % injection 10 mL  10 mL Intravenous Once Lloyd Huger, MD         Allergies:   Nsaids, Penicillins, Atorvastatin, Gabapentin, Ezetimibe, and Aspirin   Social History:  The patient  reports that she quit smoking about 30 years ago. Her smoking use included cigarettes. She smoked 0.50 packs per day. She has never used smokeless tobacco. She reports that she does not drink alcohol or use drugs.   Family History:   family history includes Colon cancer in her mother.    Review of Systems: Review of Systems  Constitutional: Negative.   HENT: Negative.   Respiratory: Negative.  Negative for shortness of  breath.   Cardiovascular: Negative.  Negative for chest pain.  Gastrointestinal: Negative.   Genitourinary: Negative.   Musculoskeletal: Negative.   Neurological: Negative.   Psychiatric/Behavioral: Negative.   All other systems reviewed and are negative.    PHYSICAL EXAM: VS:  There were no vitals taken for this visit. , BMI There is no height or weight on file to calculate BMI.   GEN: Well nourished, well developed, in no acute distress  HEENT: normal  Neck: no JVD, carotid bruits, or masses Cardiac: RRR; no murmurs, rubs, or gallops,no edema  Respiratory:  Mild crackle to left base, normal work of breathing GI: soft, nontender, nondistended, + BS MS: no deformity or atrophy  Skin: warm and dry, no rash Neuro:  Strength and sensation are intact Psych: euthymic mood, full affect  Recent Labs: 03/12/2018: ALT 14 04/12/2018: Magnesium 2.0 05/15/2018: BUN 18; Creatinine, Ser 1.24; Hemoglobin 8.9; Platelets 331; Potassium 3.6; Sodium 139    Lipid Panel Lab Results  Component Value Date   CHOL 189 04/10/2018   HDL 40 (L) 04/10/2018   LDLCALC 134 (H) 04/10/2018   TRIG 77 04/10/2018       Wt Readings from Last 3 Encounters:  12/02/18 144 lb (65.3 kg)  06/03/18 146 lb (66.2 kg)  05/21/18 144 lb (65.3 kg)       ASSESSMENT AND PLAN:  Coronary artery disease involving native coronary artery of native heart with angina pectoris (HCC) Plan: EKG 12-Lead Recommend increasing coreg up to 6.25 mg twice a day Recommend increasing enalapril up to 5 mg daily Recommend taking potassium as needed when she takes Lasix Repeat echocardiogram in 2 to 3 months time for ischemic cardiomyopathy  Non-STEMI (non-ST elevated myocardial infarction) ([place]) Plan: EKG 12-Lead No further work up needed In about 3 months get an Echo Prior ejection fraction 30 to 35% She denies any anginal symptoms  Centrilobular emphysema ([place]) Plan: stable  Disposition:   F/U  12 months  Total  encounter time more than 25 minutes. Greater than 50% was spent in counseling and coordination of care with the patient.   No orders of the defined types were placed in this encounter.  I, Margit Banda am acting as a scribe for Ida Rogue, M.D., Ph.D.  I have reviewed the above documentation  for accuracy and completeness, and I agree with the above.   Signed, Esmond Plants, M.D., Ph.D. 01/09/2019  [place], [place]

## 2019-01-13 ENCOUNTER — Ambulatory Visit: Payer: Medicare Other | Admitting: Cardiovascular Disease

## 2019-02-07 NOTE — Progress Notes (Signed)
Cardiology Office Note  Date:  02/10/2019   ID:  Krista Cisneros, DOB [birth year], MRN [id number]  PCP:  Sharyne Peach, MD   Chief Complaint  Patient presents with  . other    6 month follow up. Meds reviewed by the pt. verbally. "doing well."     HPI: Krista Cisneros is a 78 y.o. 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 abuse quitting in 1990,  HTN,  HLD,  breast cancer s/p chemoradiation,  GERD admission to [facility] from 04/09/18 to 04/12/18 for NSTEMI, successful PCI/DES x 2 to the LAD at that time,  cardiac catheterization with stent to the left circumflex 04/2018 Who presents for CAD, prior stents  Doing well, No exercise No shortness of breath Legs getting weaker No leg edema  Port still in place  On plavix Denies any chest pain concerning for angina  Labs from 11/2018, reviewed today Total chol 109,  LDL 51 HBA1C 5.6 CR 1.7 in aug 2020 ("dry")  Echo 08/2018, reviewed today ejection fraction of 50 to 55%.  EKG personally reviewed by me today  Showing normal sinus rhythm, rate rate 81 bpm, no significant ST or T wave changes   cath 04/10/18 Multivessel disease Severe mid LAD disease 95%, mid to distal stenosis as well 80% Proximal OM 3 disease large vessel 95% Moderate RCA disease 50% mid vessel  Successful angioplasty and drug-eluting stent placement to the LAD x2.  1. 90% mid LCx stenosis, unchanged from catheterization last month. 2. Widely patent proximal and mid LAD stents. 3. Successful PCI to mid LCx using Resolute Onyx 2.25 x 15 mm drug-eluting stent (postdilated to 2.6 mm) with 0% residual stenosis and TIMI-3 flow.   On 05/14/2018 NSTEMI along with chest pain and in worsening fatigue.   cardiac catheterization results detailed below, discussed with her 2 stents to the LAD, staged procedure with follow-up stent to the left  circumflex   Ejection fraction of 30 to 35% by echocardiogram April 10, 2018   PMH:   has a past medical history of Anxiety, Arthritis, CAD (coronary artery disease), Cancer ([place]), Chronic systolic CHF (congestive heart failure) ([place]), COPD (chronic obstructive pulmonary disease) ([place]), Depression, Dyspnea, GERD (gastroesophageal reflux disease), Hyperlipidemia, Hypertension, Personal history of chemotherapy (2018), and Personal history of radiation therapy (2019).  PSH:    Past Surgical History:  Procedure Laterality Date  . ABDOMINAL HYSTERECTOMY  1990   Partial  . BREAST BIOPSY Right 10/04/2016   axilla lymph node (metastatic carcinoma) and axillay tail mass biopsy-invasive mammary carcinoma  . BREAST LUMPECTOMY Right 03/21/2017   chemo first, [facility] and metastatic LN  . BREAST LUMPECTOMY Right 05/21/2017   re-excision for clip  . BREAST LUMPECTOMY WITH NEEDLE LOCALIZATION Right 03/21/2017   Procedure: BREAST LUMPECTOMY WITH NEEDLE LOCALIZATION;  Surgeon: Clayburn Pert, MD;  Location: [facility];  Service: General;  Laterality: Right;  . CORONARY STENT INTERVENTION N/A 04/10/2018   Procedure: CORONARY STENT INTERVENTION;  Surgeon: Wellington Hampshire, MD;  Location: [place] [facility];  Service: Cardiovascular;  Laterality: N/A;  . CORONARY STENT INTERVENTION N/A 05/14/2018   Procedure: CORONARY STENT INTERVENTION;  Surgeon: Nelva Bush, MD;  Location: [place] [facility];  Service: Cardiovascular;  Laterality: N/A;  . DILATION AND CURETTAGE OF UTERUS    . INGUINAL HERNIA REPAIR Right 03/26/2017   Procedure: HERNIA REPAIR INGUINAL INCARCERATED;  Surgeon: [place], [place]  F, MD;  Location: [facility];  Service: General;  Laterality: Right;  . LEFT HEART CATH AND CORONARY ANGIOGRAPHY N/A 04/10/2018   Procedure: LEFT HEART CATH AND CORONARY ANGIOGRAPHY poss PCI;  Surgeon: Minna Merritts, MD;  Location: [place] CV [facility];  Service: Cardiovascular;  Laterality: N/A;  . PORTACATH  PLACEMENT Left 10/24/2016   Procedure: INSERTION PORT-A-CATH;  Surgeon: [name], MD;  Location: [facility];  Service: General;  Laterality: Left;  . RE-EXCISION OF BREAST LUMPECTOMY Right 05/21/2017   Procedure: RE-EXCISION OF BREAST LUMPECTOMY;  Surgeon: Clayburn Pert, MD;  Location: [facility];  Service: General;  Laterality: Right;  . SENTINEL NODE BIOPSY Right 03/21/2017   Procedure: SENTINEL NODE BIOPSY;  Surgeon: Clayburn Pert, MD;  Location: [facility];  Service: General;  Laterality: Right;    Current Outpatient Medications  Medication Sig Dispense Refill  . ADVAIR DISKUS 500-50 MCG/DOSE AEPB Inhale 1 puff into the lungs 2 (two) times daily.     Marland Kitchen albuterol (PROVENTIL HFA;VENTOLIN HFA) 108 (90 Base) MCG/ACT inhaler Inhale 2 puffs into the lungs every 6 (six) hours as needed for wheezing or shortness of breath.    Marland Kitchen aspirin EC 81 MG tablet Take 81 mg by mouth daily.    . baclofen (LIORESAL) 10 MG tablet Take 10 mg by mouth at bedtime.  9  . Calcium-Vitamin D-Vitamin K [id number] MG-UNT-MCG TABS Take 1 tablet by mouth daily.    . carvedilol (COREG) 6.25 MG tablet Take 1 tablet (6.25 mg total) by mouth 2 (two) times daily. 180 tablet 3  . cholecalciferol (VITAMIN D3) 25 MCG (1000 UT) tablet Take 1,000 Units by mouth daily.    . clopidogrel (PLAVIX) 75 MG tablet Take 1 tablet (75 mg total) by mouth daily with breakfast. 90 tablet 4  . Coenzyme Q10 300 MG CAPS Take 300 mg by mouth daily.     . DULoxetine (CYMBALTA) 30 MG capsule Take 30 mg by mouth every morning.   11  . DULoxetine (CYMBALTA) 60 MG capsule Take 60 mg by mouth daily.   11  . enalapril (VASOTEC) 5 MG tablet Take 1 tablet (5 mg total) by mouth daily. 90 tablet 3  . furosemide (LASIX) 20 MG tablet Take 1 tablet (20 mg total) by mouth as needed for edema. 30 tablet 11  . KLOR-CON M20 20 MEQ tablet TAKE 1 TABLET BY MOUTH EVERY DAY 60 tablet 2  . letrozole (FEMARA) 2.5 MG tablet TAKE 1 TABLET BY MOUTH EVERY DAY 90 tablet 3  .  lidocaine-prilocaine (EMLA) cream Apply 1 application as needed topically (for port access).    . magnesium oxide (MAG-OX) 400 MG tablet Take 400 mg by mouth daily.    . Melatonin 5 MG TABS Take 10 mg by mouth at bedtime as needed (sleep).    . montelukast (SINGULAIR) 10 MG tablet Take 10 mg by mouth at bedtime.     . pantoprazole (PROTONIX) 40 MG tablet Take 1 tablet (40 mg total) by mouth daily. 90 tablet 3  . rosuvastatin (CRESTOR) 20 MG tablet Take 1 tablet (20 mg total) by mouth daily at 6 PM. 90 tablet 4  . spironolactone (ALDACTONE) 25 MG tablet Take 0.5 tablets (12.5 mg total) by mouth daily. 45 tablet 3   No current facility-administered medications for this visit.    Facility-Administered Medications Ordered in Other Visits  Medication Dose Route Frequency Provider Last Rate Last Dose  . 0.9 %  sodium chloride infusion   Intravenous Once Delight Hoh  J, MD      . 0.9 %  sodium chloride infusion   Intravenous Once Lloyd Huger, MD      . 0.9 %  sodium chloride infusion   Intravenous Once Lloyd Huger, MD      . 0.9 %  sodium chloride infusion   Intravenous Continuous Grayland Ormond, Kathlene November, MD      . heparin lock flush 100 unit/mL  500 Units Intravenous Once Lloyd Huger, MD      . ondansetron [facility]) 8 mg in sodium chloride 0.9 % 50 mL IVPB   Intravenous Once Lloyd Huger, MD      . sodium chloride flush (NS) 0.9 % injection 10 mL  10 mL Intravenous Once Lloyd Huger, MD         Allergies:   Nsaids, Penicillins, Atorvastatin, Gabapentin, Ezetimibe, and Aspirin   Social History:  The patient  reports that she quit smoking about 30 years ago. Her smoking use included cigarettes. She smoked 0.50 packs per day. She has never used smokeless tobacco. She reports that she does not drink alcohol or use drugs.   Family History:   family history includes Colon cancer in her mother.    Review of Systems: Review of Systems  Constitutional: Negative.    HENT: Negative.   Respiratory: Negative.  Negative for shortness of breath.   Cardiovascular: Negative.  Negative for chest pain.  Gastrointestinal: Negative.   Genitourinary: Negative.   Musculoskeletal: Negative.   Neurological: Negative.   Psychiatric/Behavioral: Negative.   All other systems reviewed and are negative.   PHYSICAL EXAM: VS:  BP 130/70 (BP Location: Left Arm, Patient Position: Sitting, Cuff Size: Normal)   Pulse 81   Ht 5\' 2"  (1.575 m)   Wt 147 lb 8 oz (66.9 kg)   BMI 26.98 kg/m  , BMI Body mass index is 26.98 kg/m.  Constitutional:  oriented to person, place, and time. No distress.  HENT:  Head: Grossly normal Eyes:  no discharge. No scleral icterus.  Neck: No JVD, no carotid bruits  Cardiovascular: Regular rate and rhythm, no murmurs appreciated Pulmonary/Chest: Clear to auscultation bilaterally, no wheezes or rails Abdominal: Soft.  no distension.  no tenderness.  Musculoskeletal: Normal range of motion Neurological:  normal muscle tone. Coordination normal. No atrophy Skin: Skin warm and dry Psychiatric: normal affect, pleasant   Recent Labs: 03/12/2018: ALT 14 04/12/2018: Magnesium 2.0 05/15/2018: BUN 18; Creatinine, Ser 1.24; Hemoglobin 8.9; Platelets 331; Potassium 3.6; Sodium 139    Lipid Panel Lab Results  Component Value Date   CHOL 189 04/10/2018   HDL 40 (L) 04/10/2018   LDLCALC 134 (H) 04/10/2018   TRIG 77 04/10/2018     Wt Readings from Last 3 Encounters:  02/10/19 147 lb 8 oz (66.9 kg)  12/02/18 144 lb (65.3 kg)  06/03/18 146 lb (66.2 kg)       ASSESSMENT AND PLAN:  Coronary artery disease involving native coronary artery of native heart with angina pectoris (HCC) Currently with no symptoms of angina. No further workup at this time. Continue current medication regimen. Stable  Non-STEMI (non-ST elevated myocardial infarction) (HCC) Prior ejection fraction 30 to 35% Now normal 50 to 55% Numbers discussed with her, no  medication changes made  Centrilobular emphysema (HCC) Stable Stopped smoking  CRI CR up to 1.7 Drink more  Debility Legs are weak, recommended regular walking program  Disposition:   F/U  12 months  Total encounter time more than 25  minutes. Greater than 50% was spent in counseling and coordination of care with the patient.   Orders Placed This Encounter  Procedures  . EKG 12-Lead   I, Margit Banda am acting as a scribe for Ida Rogue, M.D., Ph.D.  I have reviewed the above documentation for accuracy and completeness, and I agree with the above.   Signed, Esmond Plants, M.D., Ph.D. 02/10/2019  [place], [place]

## 2019-02-10 ENCOUNTER — Encounter: Payer: Self-pay | Admitting: Cardiovascular Disease

## 2019-02-10 ENCOUNTER — Ambulatory Visit (INDEPENDENT_AMBULATORY_CARE_PROVIDER_SITE_OTHER): Payer: Medicare Other | Admitting: Cardiovascular Disease

## 2019-02-10 ENCOUNTER — Other Ambulatory Visit: Payer: Self-pay

## 2019-02-10 VITALS — BP 130/70 | HR 81 | Ht 62.0 in | Wt 147.5 lb

## 2019-02-10 DIAGNOSIS — I255 Ischemic cardiomyopathy: Secondary | ICD-10-CM | POA: Diagnosis not present

## 2019-02-10 DIAGNOSIS — I10 Essential (primary) hypertension: Secondary | ICD-10-CM

## 2019-02-10 DIAGNOSIS — E785 Hyperlipidemia, unspecified: Secondary | ICD-10-CM

## 2019-02-10 DIAGNOSIS — I25119 Atherosclerotic heart disease of native coronary artery with unspecified angina pectoris: Secondary | ICD-10-CM

## 2019-02-10 DIAGNOSIS — J432 Centrilobular emphysema: Secondary | ICD-10-CM

## 2019-02-10 DIAGNOSIS — I5022 Chronic systolic (congestive) heart failure: Secondary | ICD-10-CM

## 2019-02-10 NOTE — Patient Instructions (Addendum)

## 2019-02-14 ENCOUNTER — Other Ambulatory Visit: Payer: Self-pay

## 2019-02-17 ENCOUNTER — Inpatient Hospital Stay: Payer: Medicare Other | Attending: Oncology

## 2019-02-17 ENCOUNTER — Other Ambulatory Visit: Payer: Self-pay

## 2019-02-17 DIAGNOSIS — Z452 Encounter for adjustment and management of vascular access device: Secondary | ICD-10-CM | POA: Diagnosis not present

## 2019-02-17 DIAGNOSIS — C50411 Malignant neoplasm of upper-outer quadrant of right female breast: Secondary | ICD-10-CM | POA: Insufficient documentation

## 2019-02-17 DIAGNOSIS — Z95828 Presence of other vascular implants and grafts: Secondary | ICD-10-CM

## 2019-02-17 MED ORDER — HEPARIN SOD (PORK) LOCK FLUSH 100 UNIT/ML IV SOLN
500.0000 [IU] | Freq: Once | INTRAVENOUS | Status: AC
  Administered 2019-02-17: 500 [IU] via INTRAVENOUS

## 2019-02-17 MED ORDER — SODIUM CHLORIDE 0.9% FLUSH
10.0000 mL | Freq: Once | INTRAVENOUS | Status: AC
  Administered 2019-02-17: 10 mL via INTRAVENOUS
  Filled 2019-02-17: qty 10

## 2019-03-18 ENCOUNTER — Other Ambulatory Visit: Payer: Self-pay

## 2019-03-18 DIAGNOSIS — Z20822 Contact with and (suspected) exposure to covid-19: Secondary | ICD-10-CM

## 2019-03-19 LAB — NOVEL CORONAVIRUS, NAA: SARS-CoV-2, NAA: NOT DETECTED

## 2019-04-14 ENCOUNTER — Inpatient Hospital Stay: Payer: Medicare Other | Attending: Oncology

## 2019-04-25 ENCOUNTER — Other Ambulatory Visit: Payer: Self-pay | Admitting: Oncology

## 2019-04-30 ENCOUNTER — Inpatient Hospital Stay: Payer: Medicare Other | Attending: Oncology

## 2019-04-30 ENCOUNTER — Other Ambulatory Visit: Payer: Self-pay

## 2019-04-30 DIAGNOSIS — C50411 Malignant neoplasm of upper-outer quadrant of right female breast: Secondary | ICD-10-CM | POA: Diagnosis not present

## 2019-04-30 DIAGNOSIS — Z452 Encounter for adjustment and management of vascular access device: Secondary | ICD-10-CM | POA: Insufficient documentation

## 2019-04-30 DIAGNOSIS — Z95828 Presence of other vascular implants and grafts: Secondary | ICD-10-CM

## 2019-04-30 MED ORDER — SODIUM CHLORIDE 0.9% FLUSH
10.0000 mL | Freq: Once | INTRAVENOUS | Status: AC
  Administered 2019-04-30: 15:00:00 10 mL via INTRAVENOUS
  Filled 2019-04-30: qty 10

## 2019-04-30 MED ORDER — HEPARIN SOD (PORK) LOCK FLUSH 100 UNIT/ML IV SOLN
500.0000 [IU] | Freq: Once | INTRAVENOUS | Status: AC
  Administered 2019-04-30: 15:00:00 500 [IU] via INTRAVENOUS
  Filled 2019-04-30: qty 5

## 2019-05-08 ENCOUNTER — Encounter: Payer: Self-pay | Admitting: Radiation Oncology

## 2019-05-08 ENCOUNTER — Ambulatory Visit
Admission: RE | Admit: 2019-05-08 | Discharge: 2019-05-08 | Disposition: A | Discharge status: Home / Self Care | Payer: Medicare Other | Source: Ambulatory Visit | Attending: Radiation Oncology | Admitting: Radiation Oncology

## 2019-05-08 ENCOUNTER — Other Ambulatory Visit: Payer: Self-pay

## 2019-05-08 VITALS — BP 149/64 | HR 82 | Resp 16 | Wt 146.0 lb

## 2019-05-08 DIAGNOSIS — Z79811 Long term (current) use of aromatase inhibitors: Secondary | ICD-10-CM | POA: Diagnosis not present

## 2019-05-08 DIAGNOSIS — Z923 Personal history of irradiation: Secondary | ICD-10-CM | POA: Diagnosis not present

## 2019-05-08 DIAGNOSIS — C50411 Malignant neoplasm of upper-outer quadrant of right female breast: Secondary | ICD-10-CM | POA: Diagnosis present

## 2019-05-08 DIAGNOSIS — Z171 Estrogen receptor negative status [ER-]: Secondary | ICD-10-CM | POA: Insufficient documentation

## 2019-05-08 NOTE — Progress Notes (Signed)
Radiation Oncology Follow up Note  Name: Krista Cisneros   Date:   05/08/2019 MRN:  [id number] DOB: 10/22/1940    This 79 y.o. 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  REFERRING PROVIDER: GSharyne Peach MD  HPI: Patient is a 79year old female now about 18 months having completed whole breast radiation as well as peripheral emphatic radiation to her right breast for stage IIb ER negative PR positive HER-2/neu overexpressed invasive mammary carcinoma status post wide local excision.  She is seen today in routine follow-up and is doing well.  She specifically denies breast tenderness cough or bone pain..  She had mammograms back in August which were BI-RADS 2 benign which I have reviewed.  COMPLICATIONS OF TREATMENT: none  FOLLOW UP COMPLIANCE: keeps appointments   PHYSICAL EXAM:  BP (!) 149/64 (BP Location: Left Arm, Patient Position: Sitting)   Pulse 82   Resp 16   Wt 146 lb (66.2 kg)   BMI 26.70 kg/m  Lungs are clear to A&P cardiac examination essentially unremarkable with regular rate and rhythm. No dominant mass or nodularity is noted in either breast in 2 positions examined. Incision is well-healed. No axillary or supraclavicular adenopathy is appreciated. Cosmetic result is excellent.  Well-developed well-nourished patient in NAD. HEENT reveals PERLA, EOMI, discs not visualized.  Oral cavity is clear. No oral mucosal lesions are identified. Neck is clear without evidence of cervical or supraclavicular adenopathy. Lungs are clear to A&P. Cardiac examination is essentially unremarkable with regular rate and rhythm without murmur rub or thrill. Abdomen is benign with no organomegaly or masses noted. Motor sensory and DTR levels are equal and symmetric in the upper and lower extremities. Cranial nerves II through XII are grossly  intact. Proprioception is intact. No peripheral adenopathy or edema is identified. No motor or sensory levels are noted. Crude visual fields are within normal range.  RADIOLOGY RESULTS: Mammograms reviewed compatible with above-stated findings  PLAN: Present time patient continues to do well 18 months out from whole breast radiation with no evidence of disease.  I am pleased with her overall progress.  She continues on letrozole without side effect.  Patient knows to call with any concerns.  Of asked to see her back in 1 year for follow-up.  I would like to take this opportunity to thank you for allowing me to participate in the care of your patient..Noreene Filbert MD

## 2019-05-15 ENCOUNTER — Other Ambulatory Visit: Payer: Self-pay | Admitting: Cardiovascular Disease

## 2019-05-15 MED ORDER — PANTOPRAZOLE SODIUM 40 MG PO TBEC: 40.0000 mg | DELAYED_RELEASE_TABLET | Freq: Every day | ORAL | 3 refills | Status: DC

## 2019-05-15 NOTE — Telephone Encounter (Signed)
*  STAT* If patient is at the pharmacy, call can be transferred to refill team.   1. Which medications need to be refilled? (please list name of each medication and dose if known)    Pantoprazole 40 mg po q d   2. Which pharmacy/location (including street and city if local pharmacy) is medication to be sent to? [facility] [address] [place]   3. Do they need a 30 day or 90 day supply? [place]

## 2019-05-15 NOTE — Telephone Encounter (Signed)
Requested Prescriptions   Signed Prescriptions Disp Refills  . pantoprazole (PROTONIX) 40 MG tablet 90 tablet 3    Sig: Take 1 tablet (40 mg total) by mouth daily.    Authorizing Provider: Minna Merritts    Ordering User: Britt Bottom

## 2019-05-26 ENCOUNTER — Other Ambulatory Visit: Payer: Self-pay | Admitting: Emergency Medicine

## 2019-05-26 DIAGNOSIS — E876 Hypokalemia: Secondary | ICD-10-CM

## 2019-05-26 MED ORDER — POTASSIUM CHLORIDE CRYS ER 20 MEQ PO TBCR: 20.0000 meq | EXTENDED_RELEASE_TABLET | Freq: Every day | ORAL | 2 refills | Status: DC

## 2019-05-28 ENCOUNTER — Emergency Department
Admission: EM | Admit: 2019-05-28 | Discharge: 2019-05-28 | Disposition: A | Discharge status: Home / Self Care | Payer: Medicare Other | Attending: Emergency Medicine | Admitting: Emergency Medicine

## 2019-05-28 ENCOUNTER — Emergency Department: Payer: Medicare Other

## 2019-05-28 ENCOUNTER — Encounter: Payer: Self-pay | Admitting: Intensive Care

## 2019-05-28 ENCOUNTER — Other Ambulatory Visit: Payer: Self-pay

## 2019-05-28 DIAGNOSIS — Z7982 Long term (current) use of aspirin: Secondary | ICD-10-CM | POA: Diagnosis not present

## 2019-05-28 DIAGNOSIS — I5022 Chronic systolic (congestive) heart failure: Secondary | ICD-10-CM | POA: Insufficient documentation

## 2019-05-28 DIAGNOSIS — Z79899 Other long term (current) drug therapy: Secondary | ICD-10-CM | POA: Diagnosis not present

## 2019-05-28 DIAGNOSIS — Z853 Personal history of malignant neoplasm of breast: Secondary | ICD-10-CM | POA: Insufficient documentation

## 2019-05-28 DIAGNOSIS — Z9221 Personal history of antineoplastic chemotherapy: Secondary | ICD-10-CM | POA: Diagnosis not present

## 2019-05-28 DIAGNOSIS — D649 Anemia, unspecified: Secondary | ICD-10-CM

## 2019-05-28 DIAGNOSIS — J449 Chronic obstructive pulmonary disease, unspecified: Secondary | ICD-10-CM | POA: Diagnosis not present

## 2019-05-28 DIAGNOSIS — Z923 Personal history of irradiation: Secondary | ICD-10-CM | POA: Insufficient documentation

## 2019-05-28 DIAGNOSIS — R5383 Other fatigue: Secondary | ICD-10-CM | POA: Insufficient documentation

## 2019-05-28 DIAGNOSIS — I252 Old myocardial infarction: Secondary | ICD-10-CM | POA: Insufficient documentation

## 2019-05-28 DIAGNOSIS — Z87891 Personal history of nicotine dependence: Secondary | ICD-10-CM | POA: Diagnosis not present

## 2019-05-28 DIAGNOSIS — I2510 Atherosclerotic heart disease of native coronary artery without angina pectoris: Secondary | ICD-10-CM | POA: Insufficient documentation

## 2019-05-28 DIAGNOSIS — I110 Hypertensive heart disease with heart failure: Secondary | ICD-10-CM | POA: Insufficient documentation

## 2019-05-28 DIAGNOSIS — R0789 Other chest pain: Secondary | ICD-10-CM | POA: Diagnosis not present

## 2019-05-28 DIAGNOSIS — R799 Abnormal finding of blood chemistry, unspecified: Secondary | ICD-10-CM | POA: Diagnosis present

## 2019-05-28 LAB — BASIC METABOLIC PANEL
Anion gap: 5 (ref 5–15)
BUN: 21 mg/dL (ref 8–23)
CO2: 28 mmol/L (ref 22–32)
Calcium: 8.9 mg/dL (ref 8.9–10.3)
Chloride: 107 mmol/L (ref 98–111)
Creatinine, Ser: 1.5 mg/dL — ABNORMAL HIGH (ref 0.44–1.00)
GFR calc Af Amer: 39 mL/min — ABNORMAL LOW (ref >60)
GFR calc non Af Amer: 33 mL/min — ABNORMAL LOW (ref >60)
Glucose, Bld: 153 mg/dL — ABNORMAL HIGH (ref 70–99)
Potassium: 4.6 mmol/L (ref 3.5–5.1)
Sodium: 140 mmol/L (ref 135–145)

## 2019-05-28 LAB — CBC
HCT: 27.4 % — ABNORMAL LOW (ref 36.0–46.0)
Hemoglobin: 7.2 g/dL — ABNORMAL LOW (ref 12.0–15.0)
MCH: 18.2 pg — ABNORMAL LOW (ref 26.0–34.0)
MCHC: 26.3 g/dL — ABNORMAL LOW (ref 30.0–36.0)
MCV: 69.2 fL — ABNORMAL LOW (ref 80.0–100.0)
Platelets: 403 10*3/uL — ABNORMAL HIGH (ref 150–400)
RBC: 3.96 MIL/uL (ref 3.87–5.11)
RDW: 21.5 % — ABNORMAL HIGH (ref 11.5–15.5)
WBC: 9 10*3/uL (ref 4.0–10.5)
nRBC: 0 % (ref 0.0–0.2)

## 2019-05-28 LAB — PREPARE RBC (CROSSMATCH)

## 2019-05-28 LAB — TROPONIN I (HIGH SENSITIVITY)
Troponin I (High Sensitivity): 6 ng/L (ref <18)
Troponin I (High Sensitivity): 6 ng/L (ref <18)

## 2019-05-28 LAB — PROTIME-INR
INR: 1.2 (ref 0.8–1.2)
Prothrombin Time: 14.6 seconds (ref 11.4–15.2)

## 2019-05-28 IMAGING — CR DG CHEST 2V
1 series · 2 of 2 positions shown · non-contrast
Comparison: [DATE] and CT chest [DATE].

CLINICAL DATA: Low hemoglobin, fatigue, cough, chest pain.

EXAM:
CHEST - 2 VIEW

[Series 1: dg chest 2 view · 0.14mm/px · 2 of 2 slices shown]
[im 1/2]
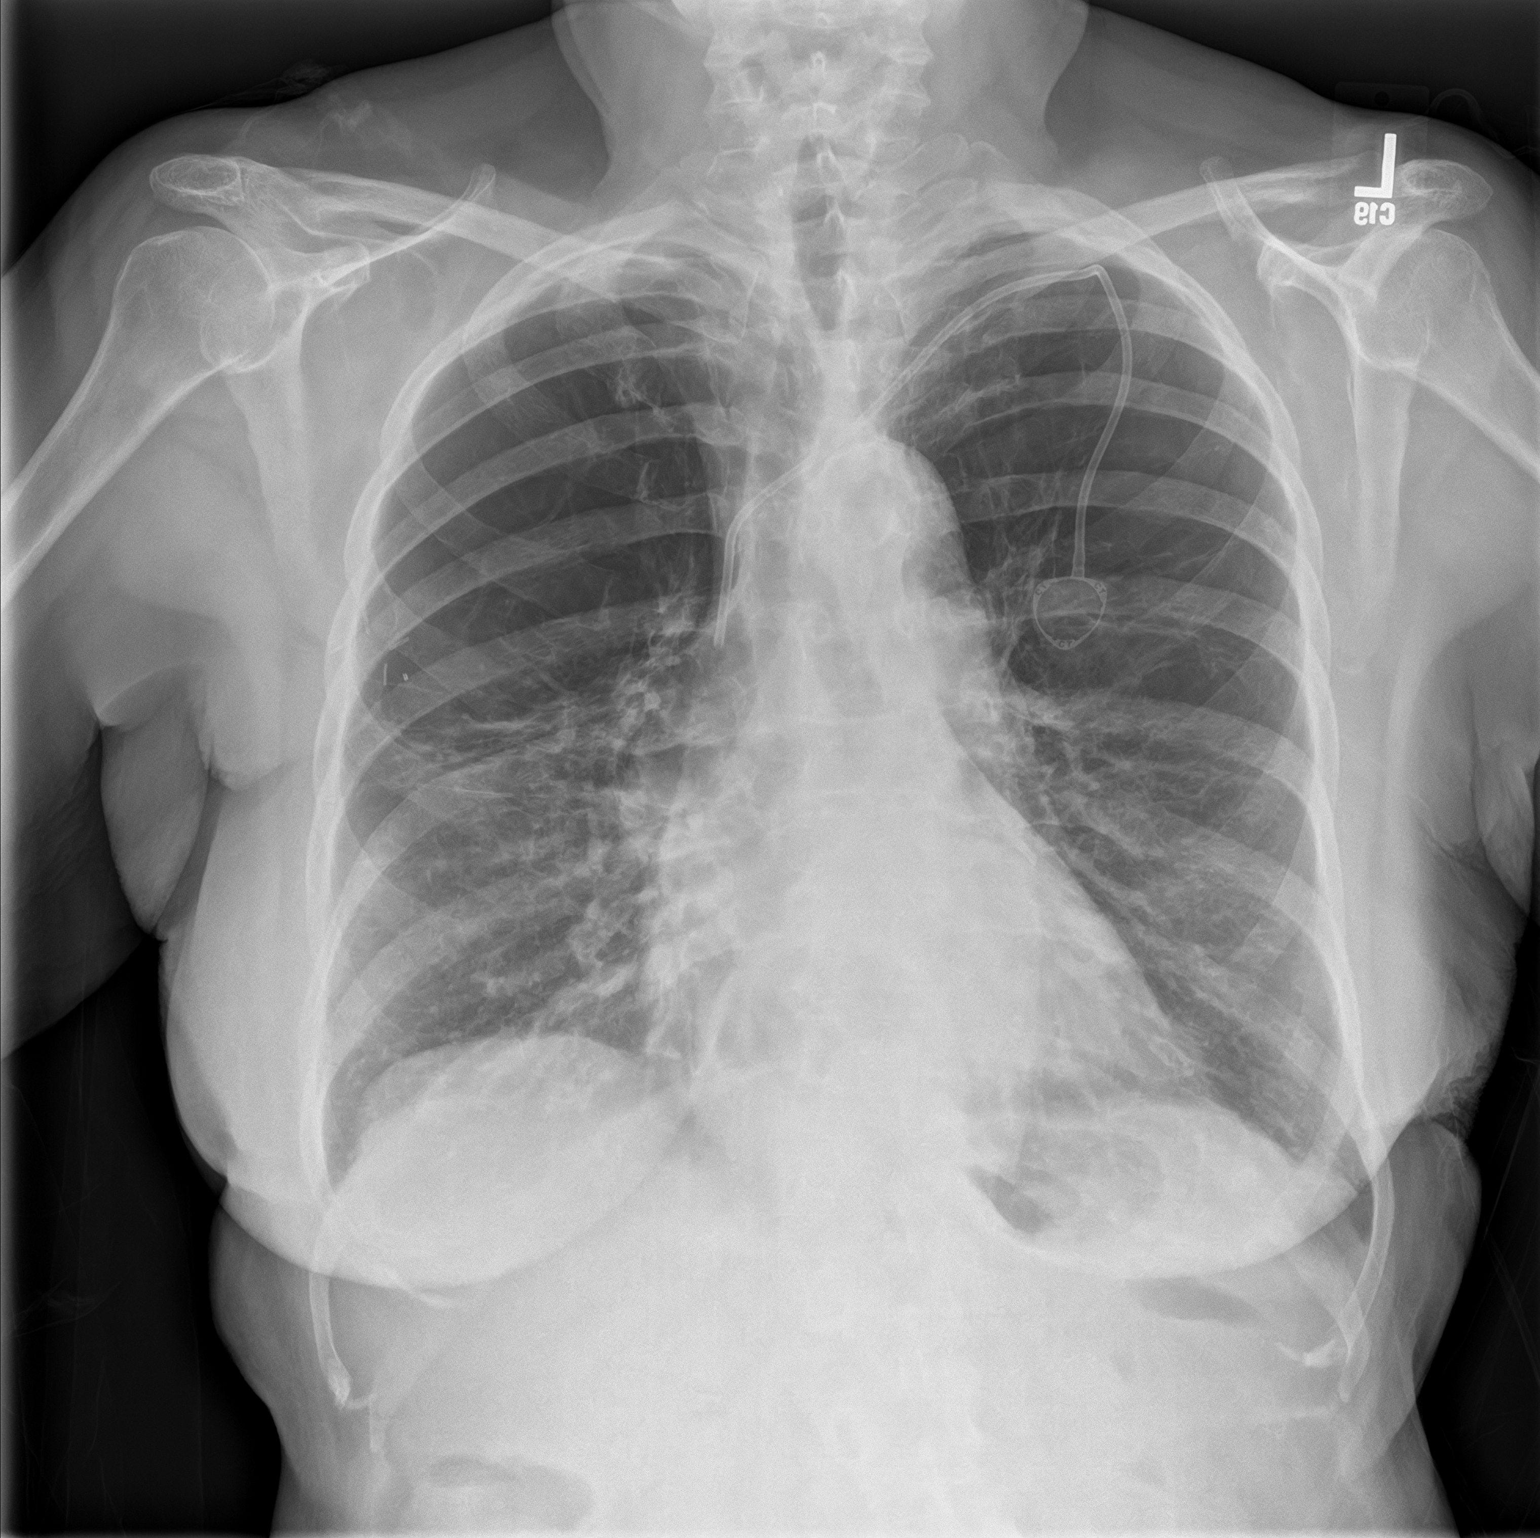
[im 2/2]
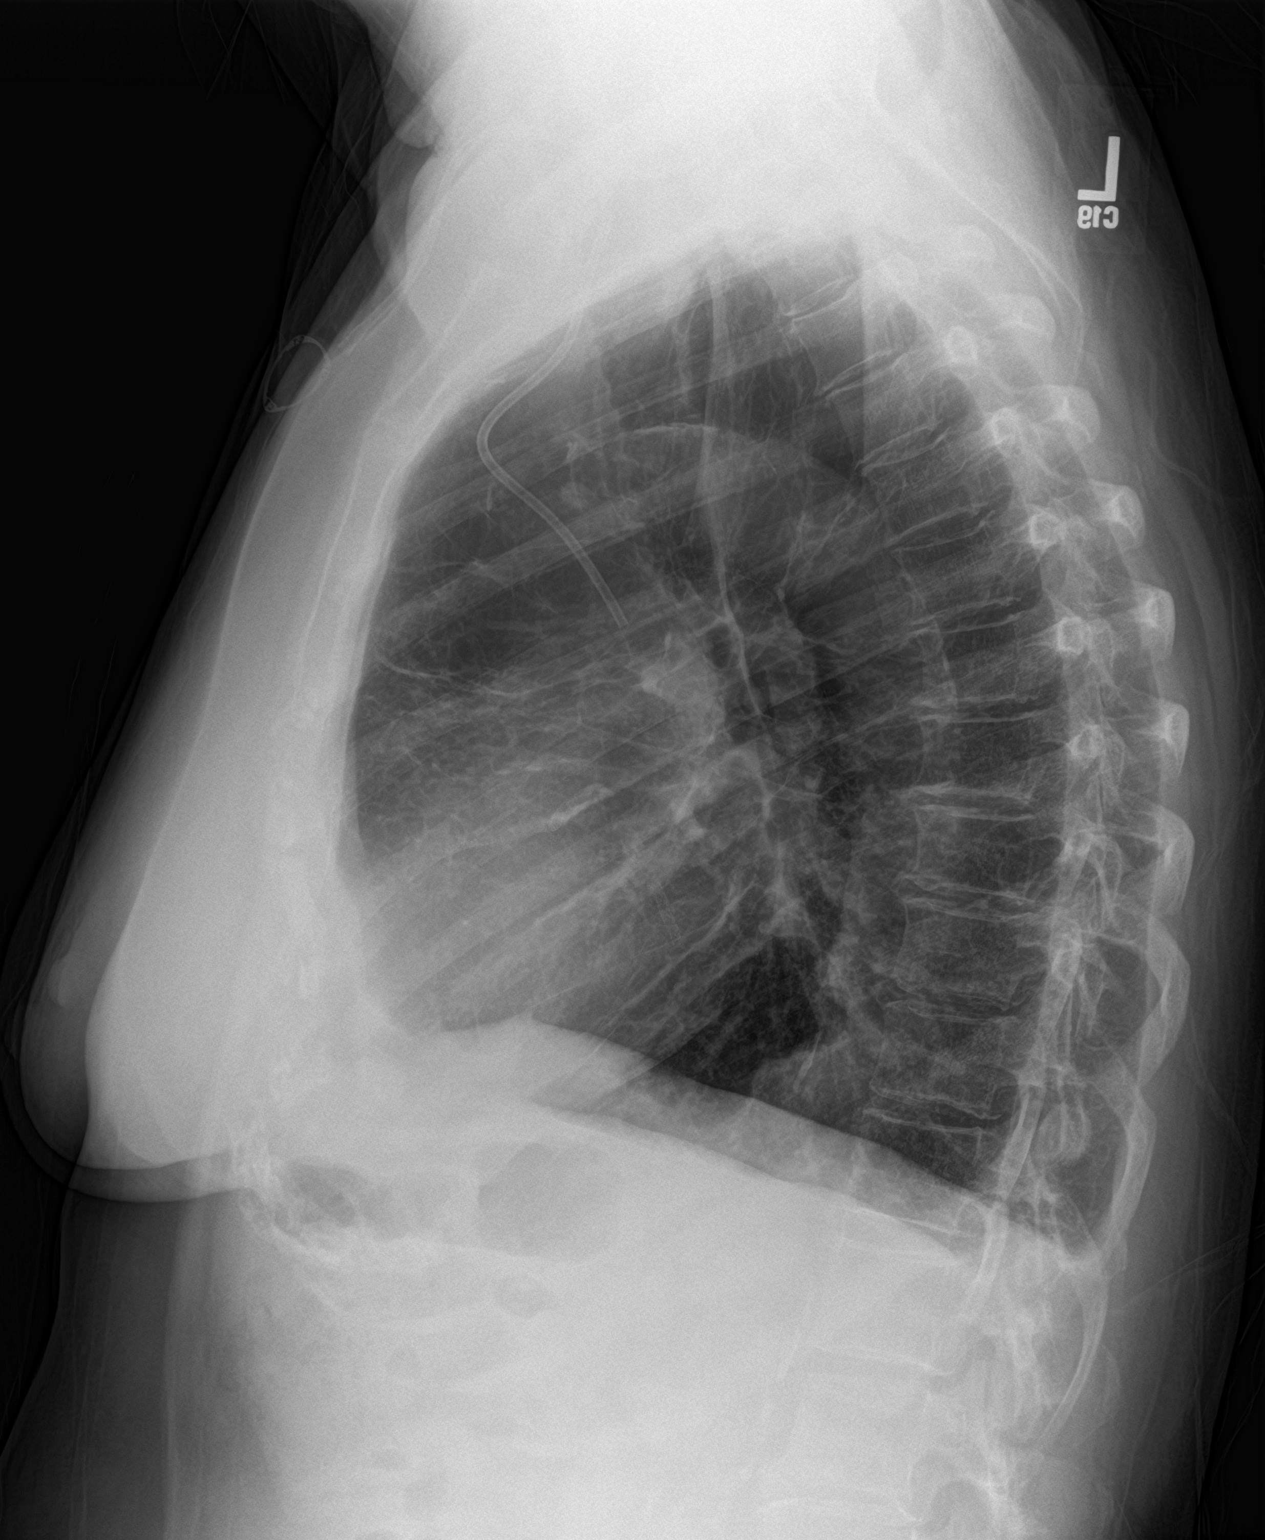

[2 of 2 positions shown; findings below may reference images not displayed]

FINDINGS: Trachea is midline. Heart size normal. Thoracic aorta is calcified.
Left subclavian Port-A-Cath is in the SVC. Bullous emphysema with
accentuation of the mid and lower lung zone pulmonary markings. No
airspace consolidation or pleural fluid.
IMPRESSION: 1. No acute findings.
2.  Emphysema ([XP]-[XP]).
3.  Aortic atherosclerosis ([XP]-[XP]).

## 2019-05-28 MED ORDER — SODIUM CHLORIDE 0.9 % IV SOLN
10.0000 mL/h | Freq: Once | INTRAVENOUS | Status: AC
  Administered 2019-05-28: 10 mL/h via INTRAVENOUS

## 2019-05-28 NOTE — ED Notes (Addendum)
Daughter called and is in route to ED. Pts port has been D/C with heparin flush. Area is clean and dry with temporary bandage applied. D/C paperwork and follow up instructions provided to pt with verbal understanding.

## 2019-05-28 NOTE — ED Triage Notes (Signed)
Patient reports getting results from PCP that hemoglobin was abnormally low around 7. Patient c/o feeling fatigued. A&O x4 in triage

## 2019-05-28 NOTE — ED Notes (Signed)
NO change in condition, pt resting comfortably.

## 2019-05-28 NOTE — ED Notes (Signed)
Pt tolerating PRBC well without complaints. No co pain or discomfort at this time, will continue to monitor.

## 2019-05-28 NOTE — ED Provider Notes (Addendum)
[facility] Emergency Department Provider Note  Time seen: 6:07 PM  I have reviewed the triage vital signs and the nursing notes.   HISTORY  Chief Complaint Abnormal Lab and Chest Pain   HPI Krista Cisneros is a 79 y.o. female with a past medical history anxiety, CAD, breast cancer, COPD, hypertension, hyperlipidemia presents to the emergency department for low hemoglobin level.  According to the patient she recently saw her doctor who performed blood work , got a call today saying that her hemoglobin was low and she needed to go to the emergency department.  Patient states she has been feeling fatigued but states this has been ongoing for months or years.  Denies any significant shortness of breath denies any chest pain although states at times she will have a mild tightness sensation.  Denies any black or bloody stool, vaginal bleeding or hematuria.  Patient states no history of GI bleeding previously.  No history of transfusions previously.  Past Medical History:  Diagnosis Date  . Anxiety   . Arthritis   . CAD (coronary artery disease)    a. NSTEMI 12/19; b. LHC 04/10/18: pLAD 95%, mLAD 80%, m-dLCx 95%, OM3-1 lesion 40%, OM3-2 lesion 60%, mRCA 50%, EF 35-45%, successful PCI/DES x 2 to the LAD with recommended staged PCI of the LCx in a few weeks  . Cancer [facility])    Right Breast Cancer  . Chronic systolic CHF (congestive heart failure) ([place])    a. TTE 12/19: EF 30-35%, anteroseptal, anterior, and apical HK, Gr1DD, mild to mod MR, mildly dilated LA, RVSF nl  . COPD (chronic obstructive pulmonary disease) ([place])   . Depression   . Dyspnea    with exertion  . GERD (gastroesophageal reflux disease)   . Hyperlipidemia   . Hypertension   . Personal history of chemotherapy 2018   chemo prior to lumpectomy of right breast  . Personal history of radiation therapy 2019   right breast ca    Patient Active Problem List   Diagnosis Date Noted  . Ischemic  cardiomyopathy 06/03/2018  . Coronary artery disease involving native coronary artery of native heart with angina pectoris ([place]) 05/14/2018  . Chronic systolic heart failure ([place])   . Coronary artery disease involving native coronary artery of native heart without angina pectoris 04/22/2018  . Non-STEMI (non-ST elevated myocardial infarction) ([place]) 04/22/2018  . NSTEMI (non-ST elevated myocardial infarction) ([place]) 04/09/2018  . Malignant neoplasm of upper-outer quadrant of right breast in female, estrogen receptor negative ([place]) 06/19/2017  . Incarcerated hernia 03/26/2017  . Incarcerated inguinal hernia   . Acute kidney injury ([place]) 02/07/2017  . Hypomagnesemia 01/30/2017  . Goals of care, counseling/discussion 10/20/2016  . Malignant neoplasm of right female breast ([place]) 10/16/2016  . Hyperlipidemia, mixed 10/08/2016  . Chronic venous insufficiency 09/14/2016  . GERD (gastroesophageal reflux disease) 09/14/2016  . Pain in limb 05/29/2016  . Essential hypertension 05/29/2016  . COPD (chronic obstructive pulmonary disease) ([place]) 05/29/2016  . Hardening of the aorta (main artery of the heart) ([place]) 01/07/2016  . Hyperglycemia, unspecified 01/07/2016  . Neuropathy 11/15/2015  . Severe recurrent major depression without psychotic features ([place]) 09/10/2015  . Allergic rhinitis 04/10/2014  . Depression 04/10/2014  . Obesity, unspecified 04/10/2014    Past Surgical History:  Procedure Laterality Date  . ABDOMINAL HYSTERECTOMY  1990   Partial  . BREAST BIOPSY Right 10/04/2016   axilla lymph node (metastatic carcinoma) and axillay tail mass biopsy-invasive mammary carcinoma  . BREAST LUMPECTOMY  Right 03/21/2017   chemo first, [facility] and metastatic LN  . BREAST LUMPECTOMY Right 05/21/2017   re-excision for clip  . BREAST LUMPECTOMY WITH NEEDLE LOCALIZATION Right 03/21/2017   Procedure: BREAST LUMPECTOMY WITH NEEDLE LOCALIZATION;  Surgeon: Clayburn Pert, MD;  Location: [facility] ORS;   Service: General;  Laterality: Right;  . CORONARY STENT INTERVENTION N/A 04/10/2018   Procedure: CORONARY STENT INTERVENTION;  Surgeon: Wellington Hampshire, MD;  Location: [place] CV LAB;  Service: Cardiovascular;  Laterality: N/A;  . CORONARY STENT INTERVENTION N/A 05/14/2018   Procedure: CORONARY STENT INTERVENTION;  Surgeon: Nelva Bush, MD;  Location: [place] CV LAB;  Service: Cardiovascular;  Laterality: N/A;  . DILATION AND CURETTAGE OF UTERUS    . INGUINAL HERNIA REPAIR Right 03/26/2017   Procedure: HERNIA REPAIR INGUINAL INCARCERATED;  Surgeon: Jules Husbands, MD;  Location: [facility] ORS;  Service: General;  Laterality: Right;  . LEFT HEART CATH AND CORONARY ANGIOGRAPHY N/A 04/10/2018   Procedure: LEFT HEART CATH AND CORONARY ANGIOGRAPHY poss PCI;  Surgeon: Minna Merritts, MD;  Location: [place] CV LAB;  Service: Cardiovascular;  Laterality: N/A;  . PORTACATH PLACEMENT Left 10/24/2016   Procedure: INSERTION PORT-A-CATH;  Surgeon: [name], MD;  Location: [facility] ORS;  Service: General;  Laterality: Left;  . RE-EXCISION OF BREAST LUMPECTOMY Right 05/21/2017   Procedure: RE-EXCISION OF BREAST LUMPECTOMY;  Surgeon: Clayburn Pert, MD;  Location: [facility] ORS;  Service: General;  Laterality: Right;  . SENTINEL NODE BIOPSY Right 03/21/2017   Procedure: SENTINEL NODE BIOPSY;  Surgeon: Clayburn Pert, MD;  Location: [facility] ORS;  Service: General;  Laterality: Right;    Prior to Admission medications   Medication Sig Start Date End Date Taking? Authorizing Provider  ADVAIR DISKUS 500-50 MCG/DOSE AEPB Inhale 1 puff into the lungs 2 (two) times daily.  05/04/16   [provider]  albuterol (PROVENTIL HFA;VENTOLIN HFA) 108 (90 Base) MCG/ACT inhaler Inhale 2 puffs into the lungs every 6 (six) hours as needed for wheezing or shortness of breath.    [provider]  aspirin EC 81 MG tablet Take 81 mg by mouth daily.    [provider]  baclofen (LIORESAL) 10  MG tablet Take 10 mg by mouth at bedtime. 01/14/17   [provider]  Calcium-Vitamin D-Vitamin K [id number] MG-UNT-MCG TABS Take 1 tablet by mouth daily.    [provider]  carvedilol (COREG) 6.25 MG tablet Take 1 tablet (6.25 mg total) by mouth 2 (two) times daily. 06/03/18   Minna Merritts, MD  cholecalciferol (VITAMIN D3) 25 MCG (1000 UT) tablet Take 1,000 Units by mouth daily.    [provider]  clopidogrel (PLAVIX) 75 MG tablet Take 1 tablet (75 mg total) by mouth daily with breakfast. 05/16/18   Marrianne Mood D, PA-C  Coenzyme Q10 300 MG CAPS Take 300 mg by mouth daily.     [provider]  DULoxetine (CYMBALTA) 30 MG capsule Take 30 mg by mouth every morning.  05/04/17   [provider]  DULoxetine (CYMBALTA) 60 MG capsule Take 60 mg by mouth daily.  03/04/17   [provider]  enalapril (VASOTEC) 5 MG tablet Take 1 tablet (5 mg total) by mouth daily. 06/03/18   Minna Merritts, MD  furosemide (LASIX) 20 MG tablet Take 1 tablet (20 mg total) by mouth as needed for edema. 04/22/18 04/22/19  Rise Mu, PA-C  letrozole [facility]) 2.5 MG tablet TAKE 1 TABLET BY MOUTH EVERY DAY 06/07/18  Verlon Au, NP  lidocaine-prilocaine (EMLA) cream Apply 1 application as needed topically (for port access).    [provider]  magnesium oxide (MAG-OX) 400 MG tablet Take 400 mg by mouth daily.    [provider]  Melatonin 5 MG TABS Take 10 mg by mouth at bedtime as needed (sleep).    [provider]  montelukast (SINGULAIR) 10 MG tablet Take 10 mg by mouth at bedtime.  05/23/16   [provider]  pantoprazole (PROTONIX) 40 MG tablet Take 1 tablet (40 mg total) by mouth daily. 05/15/19   Minna Merritts, MD  potassium chloride SA (KLOR-CON M20) 20 MEQ tablet Take 1 tablet (20 mEq total) by mouth daily. 05/26/19   Lloyd Huger, MD  rosuvastatin (CRESTOR) 20 MG tablet Take 1 tablet (20 mg total) by mouth  daily at 6 PM. 05/15/18   Marrianne Mood D, PA-C  spironolactone (ALDACTONE) 25 MG tablet Take 0.5 tablets (12.5 mg total) by mouth daily. 05/15/18 02/10/19  Marrianne Mood D, PA-C  prochlorperazine (COMPAZINE) 10 MG tablet Take 1 tablet (10 mg total) by mouth every 6 (six) hours as needed (Nausea or vomiting). 10/20/16 07/31/17  Lloyd Huger, MD    Allergies  Allergen Reactions  . Nsaids     Bleeding risk  . Penicillins Anaphylaxis, Swelling and Rash    Has patient had a PCN reaction causing immediate rash, facial/tongue/throat swelling, SOB or lightheadedness with hypotension: Yes Has patient had a PCN reaction causing severe rash involving mucus membranes or skin necrosis: No Has patient had a PCN reaction that required hospitalization: No  Has patient had a PCN reaction occurring within the last 10 years: No If all of the above answers are "NO", then may proceed with Cephalosporin use.   . Atorvastatin Other (See Comments)    Muscle Pain  . Gabapentin Swelling  . Ezetimibe Other (See Comments)    Muscle pain  . Aspirin     GI bleeding risk    Family History  Problem Relation Age of Onset  . Colon cancer Mother   . Breast cancer Neg Hx     Social History Social History   Tobacco Use  . Smoking status: Former Smoker    Packs/day: 0.50    Types: Cigarettes    Quit date: 07/23/1988    Years since quitting: 30.8  . Smokeless tobacco: Never Used  Substance Use Topics  . Alcohol use: Yes    Comment: rare  . Drug use: No    Review of Systems Constitutional: Negative for fever. Cardiovascular: Negative for chest pain. Respiratory: Negative for shortness of breath. Gastrointestinal: Negative for abdominal pain Musculoskeletal: Negative for musculoskeletal complaints Neurological: Negative for headache All other ROS negative  ____________________________________________   PHYSICAL EXAM:  VITAL SIGNS: ED Triage Vitals  Enc Vitals Group     BP 05/28/19  1439 (!) 149/58     Pulse Rate 05/28/19 1439 76     Resp 05/28/19 1439 17     Temp 05/28/19 1439 97.7 F (36.5 C)     Temp Source 05/28/19 1439 Oral     SpO2 05/28/19 1439 95 %     Weight 05/28/19 1440 140 lb (63.5 kg)     Height 05/28/19 1440 5\' 2"  (1.575 m)     Head Circumference --      Peak Flow --      Pain Score 05/28/19 1451 3     Pain Loc --  Pain Edu? --      Excl. in [place]? --    Constitutional: Alert and oriented. Well appearing and in no distress. Eyes: Normal exam ENT      Head: Normocephalic and atraumatic.      Mouth/Throat: Mucous membranes are moist. Cardiovascular: Normal rate, regular rhythm.  Respiratory: Normal respiratory effort without tachypnea nor retractions. Breath sounds are clear Gastrointestinal: Soft and nontender. No distention.  Musculoskeletal: Nontender with normal range of motion in all extremities.  Neurologic:  Normal speech and language. No gross focal neurologic deficits  Skin:  Skin is warm, dry and intact.  Psychiatric: Mood and affect are normal.   ____________________________________________    EKG  EKG viewed and interpreted by myself shows a normal sinus rhythm at 74 bpm with a narrow QRS, normal axis, normal intervals, no concerning ST changes.  ____________________________________________    RADIOLOGY  Chest x-ray negative  ____________________________________________   INITIAL IMPRESSION / ASSESSMENT AND PLAN / ED COURSE  Pertinent labs & imaging results that were available during my care of the patient were reviewed by me and considered in my medical decision making (see chart for details).   Patient presents to the emergency department for a low hemoglobin referred by her PCP.  Patient's hemoglobin level today is 7.2.  Overall the patient appears well, no distress denies any black or bloody stool hematuria or vaginal bleeding.  No history of GI bleeds in the past, no history of blood transfusions in the past.   Patient's hemoglobin at baseline appears to be around 9.  We will perform a rectal examination to check for occult blood.  Given the patient's level is 7.2 I do believe she would benefit from a blood transfusion.  If the rectal exam is positive we will admit for blood transfusion, if rectal is negative we could possibly transfuse the patient have her follow-up with her PCP for further work-up and treatment.  Patient agreeable to plan of care.  Patient's hemoglobin is 7.2.  I spoke with Dr. Rogue Bussing who is on-call for Dr. Grayland Ormond the patient's oncologist.  He recommends transfusing the patient 1 unit of packed red blood cells in the emergency department and they will follow up with the patient for outpatient work-up and treatment of her anemia.  Her MCV is 69 possibly indicating iron deficiency anemia.  Rectal examination shows light brown stool guaiac negative.  Patient agreeable to plan of care.  Patient is finishing her unit of blood.  We will discharge upon completion.  Patient will follow up with oncology/hematology.  Krista Cisneros was evaluated in Emergency Department on 05/28/2019 for the symptoms described in the history of present illness. She was evaluated in the context of the global COVID-19 pandemic, which necessitated consideration that the patient might be at risk for infection with the SARS-CoV-2 virus that causes COVID-19. Institutional protocols and algorithms that pertain to the evaluation of patients at risk for COVID-19 are in a state of rapid change based on information released by regulatory bodies including the CDC and federal and state organizations. These policies and algorithms were followed during the patient's care in the ED.  CRITICAL CARE Performed by: Harvest Dark   Total critical care time: 30 minutes  Critical care time was exclusive of separately billable procedures and treating other patients.  Critical care was necessary to treat or prevent imminent or  life-threatening deterioration.  Critical care was time spent personally by me on the following activities: development of treatment plan with patient  and/or surrogate as well as nursing, discussions with consultants, evaluation of patient's response to treatment, examination of patient, obtaining history from patient or surrogate, ordering and performing treatments and interventions, ordering and review of laboratory studies, ordering and review of radiographic studies, pulse oximetry and re-evaluation of patient's condition.  ____________________________________________   FINAL CLINICAL IMPRESSION(S) / ED DIAGNOSES  Symptomatic anemia    Harvest Dark, MD 05/28/19 2046    Harvest Dark, MD 05/28/19 2047

## 2019-05-29 ENCOUNTER — Other Ambulatory Visit: Payer: Self-pay | Admitting: Emergency Medicine

## 2019-05-29 ENCOUNTER — Encounter: Payer: Self-pay | Admitting: Oncology

## 2019-05-29 ENCOUNTER — Other Ambulatory Visit: Payer: Self-pay

## 2019-05-29 DIAGNOSIS — Z171 Estrogen receptor negative status [ER-]: Secondary | ICD-10-CM

## 2019-05-29 DIAGNOSIS — C50411 Malignant neoplasm of upper-outer quadrant of right female breast: Secondary | ICD-10-CM

## 2019-05-29 DIAGNOSIS — C50919 Malignant neoplasm of unspecified site of unspecified female breast: Secondary | ICD-10-CM

## 2019-05-29 LAB — TYPE AND SCREEN
ABO/RH(D): O POS
Antibody Screen: NEGATIVE
Unit division: 0

## 2019-05-29 LAB — BPAM RBC
Blood Product Expiration Date: 202103012359
ISSUE DATE / TIME: 202102031908
Unit Type and Rh: 5100

## 2019-05-29 NOTE — Progress Notes (Signed)
Patient pre screened for office appointment, no questions or concerns today. Patient reminded of upcoming appointment time and date. 

## 2019-05-30 ENCOUNTER — Inpatient Hospital Stay (HOSPITAL_BASED_OUTPATIENT_CLINIC_OR_DEPARTMENT_OTHER): Payer: Medicare Other | Admitting: Oncology

## 2019-05-30 ENCOUNTER — Other Ambulatory Visit: Payer: Self-pay

## 2019-05-30 ENCOUNTER — Inpatient Hospital Stay: Payer: Medicare Other | Attending: Oncology

## 2019-05-30 DIAGNOSIS — Z452 Encounter for adjustment and management of vascular access device: Secondary | ICD-10-CM | POA: Insufficient documentation

## 2019-05-30 DIAGNOSIS — C50411 Malignant neoplasm of upper-outer quadrant of right female breast: Secondary | ICD-10-CM

## 2019-05-30 DIAGNOSIS — Z170 Estrogen receptor positive status [ER+]: Secondary | ICD-10-CM | POA: Insufficient documentation

## 2019-05-30 DIAGNOSIS — D509 Iron deficiency anemia, unspecified: Secondary | ICD-10-CM | POA: Diagnosis not present

## 2019-05-30 DIAGNOSIS — Z79811 Long term (current) use of aromatase inhibitors: Secondary | ICD-10-CM | POA: Insufficient documentation

## 2019-05-30 DIAGNOSIS — Z171 Estrogen receptor negative status [ER-]: Secondary | ICD-10-CM

## 2019-05-30 DIAGNOSIS — Z95828 Presence of other vascular implants and grafts: Secondary | ICD-10-CM

## 2019-05-30 DIAGNOSIS — C50919 Malignant neoplasm of unspecified site of unspecified female breast: Secondary | ICD-10-CM

## 2019-05-30 LAB — IRON AND TIBC
Iron: 26 ug/dL — ABNORMAL LOW (ref 28–170)
Saturation Ratios: 6 % — ABNORMAL LOW (ref 10.4–31.8)
TIBC: 442 ug/dL (ref 250–450)
UIBC: 416 ug/dL

## 2019-05-30 LAB — CBC WITH DIFFERENTIAL/PLATELET
Abs Immature Granulocytes: 0.04 10*3/uL (ref 0.00–0.07)
Basophils Absolute: 0.1 10*3/uL (ref 0.0–0.1)
Basophils Relative: 1 %
Eosinophils Absolute: 0.6 10*3/uL — ABNORMAL HIGH (ref 0.0–0.5)
Eosinophils Relative: 6 %
HCT: 31.5 % — ABNORMAL LOW (ref 36.0–46.0)
Hemoglobin: 8.7 g/dL — ABNORMAL LOW (ref 12.0–15.0)
Immature Granulocytes: 0 %
Lymphocytes Relative: 17 %
Lymphs Abs: 1.7 10*3/uL (ref 0.7–4.0)
MCH: 20.1 pg — ABNORMAL LOW (ref 26.0–34.0)
MCHC: 27.6 g/dL — ABNORMAL LOW (ref 30.0–36.0)
MCV: 72.7 fL — ABNORMAL LOW (ref 80.0–100.0)
Monocytes Absolute: 1.2 10*3/uL — ABNORMAL HIGH (ref 0.1–1.0)
Monocytes Relative: 12 %
Neutro Abs: 6.2 10*3/uL (ref 1.7–7.7)
Neutrophils Relative %: 64 %
Platelets: 367 10*3/uL (ref 150–400)
RBC: 4.33 MIL/uL (ref 3.87–5.11)
RDW: 23.1 % — ABNORMAL HIGH (ref 11.5–15.5)
WBC: 9.8 10*3/uL (ref 4.0–10.5)
nRBC: 0 % (ref 0.0–0.2)

## 2019-05-30 LAB — SAMPLE TO BLOOD BANK

## 2019-05-30 LAB — FERRITIN: Ferritin: 5 ng/mL — ABNORMAL LOW (ref 11–307)

## 2019-05-30 MED ORDER — HEPARIN SOD (PORK) LOCK FLUSH 100 UNIT/ML IV SOLN
500.0000 [IU] | Freq: Once | INTRAVENOUS | Status: AC
  Administered 2019-05-30: 500 [IU] via INTRAVENOUS
  Filled 2019-05-30: qty 5

## 2019-05-30 MED ORDER — SODIUM CHLORIDE 0.9% FLUSH
10.0000 mL | Freq: Once | INTRAVENOUS | Status: AC
  Administered 2019-05-30: 10 mL via INTRAVENOUS
  Filled 2019-05-30: qty 10

## 2019-05-30 NOTE — Progress Notes (Deleted)
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  INTERVAL HISTORY: Patient returns to clinic today for routine 81-monthevaluation.  She continues to tolerate letrozole without significant side effects.  She currently feels well and is asymptomatic.  She does not complain of weakness or fatigue today.  She has no neurologic complaints. She denies any recent fevers or illnesses.  She denies any chest pain, shortness of breath, cough, or hemoptysis.  She denies any nausea, vomiting, constipation, or diarrhea.  She has no urinary complaints.  Patient feels at her baseline offers no specific complaints today.  REVIEW OF SYSTEMS:   Review of Systems  Constitutional: Negative.  Negative for fever, malaise/fatigue and weight loss.  Respiratory: Negative.  Negative for cough and shortness of breath.   Cardiovascular: Negative.  Negative for chest pain and leg swelling.  Gastrointestinal: Negative.  Negative for abdominal pain, constipation, diarrhea and nausea.  Genitourinary: Negative.  Negative for dysuria.  Musculoskeletal: Negative.  Negative for joint pain.  Skin: Negative.  Negative for rash.  Neurological: Negative.  Negative for sensory change, focal weakness, weakness and headaches.  Psychiatric/Behavioral: Negative.  The patient is not nervous/anxious.     As per HPI. Otherwise, a complete review of systems is negative.  PAST MEDICAL HISTORY: Past  Medical History:  Diagnosis Date  . Anxiety   . Arthritis   . CAD (coronary artery disease)    a. NSTEMI 12/19; b. LHC 04/10/18: pLAD 95%, mLAD 80%, m-dLCx 95%, OM3-1 lesion 40%, OM3-2 lesion 60%, mRCA 50%, EF 35-45%, successful PCI/DES x 2 to the LAD with recommended staged PCI of the LCx in a few weeks  . Cancer ([facility]    Right Breast Cancer  . Chronic systolic CHF (congestive heart failure) ([place]    a. TTE 12/19: EF 30-35%, anteroseptal, anterior, and apical HK, Gr1DD, mild to mod MR, mildly dilated LA, RVSF nl  . COPD (chronic obstructive pulmonary disease) ([place]   . Depression   . Dyspnea    with exertion  . GERD (gastroesophageal reflux disease)   . Hyperlipidemia   . Hypertension   . Personal history of chemotherapy 2018   chemo prior to lumpectomy of right breast  . Personal history of radiation therapy 2019   right breast ca    PAST SURGICAL HISTORY: Past Surgical History:  Procedure Laterality Date  . ABDOMINAL HYSTERECTOMY  1990   Partial  . BREAST BIOPSY Right 10/04/2016   axilla lymph node (metastatic carcinoma) and axillay tail mass biopsy-invasive mammary carcinoma  . [facility] Right 03/21/2017   [facility]  . BREAST LUMPECTOMY Right 05/21/2017   re-excision for clip  . BREAST LUMPECTOMY WITH NEEDLE LOCALIZATION Right 03/21/2017   Procedure: BREAST LUMPECTOMY WITH NEEDLE LOCALIZATION;  Surgeon: WClayburn Pert MD;  Location: [facility];  Service: General;  Laterality: Right;  . CORONARY STENT INTERVENTION N/A 04/10/2018   Procedure: CORONARY STENT INTERVENTION;  Surgeon: AWellington Hampshire MD;  [facility];  Service: Cardiovascular;  Laterality: N/A;  .  CORONARY STENT INTERVENTION N/A 05/14/2018   Procedure: CORONARY STENT INTERVENTION;  Surgeon: Nelva Bush, MD;  Location: [place] [facility];  Service: Cardiovascular;  Laterality: N/A;  . DILATION AND CURETTAGE OF UTERUS    . INGUINAL HERNIA REPAIR Right  03/26/2017   Procedure: HERNIA REPAIR INGUINAL INCARCERATED;  Surgeon: Jules Husbands, MD;  Location: [facility] ORS;  Service: General;  Laterality: Right;  . LEFT HEART CATH AND CORONARY ANGIOGRAPHY N/A 04/10/2018   Procedure: LEFT HEART CATH AND CORONARY ANGIOGRAPHY poss PCI;  Surgeon: Minna Merritts, MD;  Location: [place] [facility];  Service: Cardiovascular;  Laterality: N/A;  . PORTACATH PLACEMENT Left 10/24/2016   Procedure: INSERTION PORT-A-CATH;  Surgeon: [name], MD;  Location: [facility];  Service: General;  Laterality: Left;  . RE-EXCISION OF BREAST LUMPECTOMY Right 05/21/2017   Procedure: RE-EXCISION OF BREAST LUMPECTOMY;  Surgeon: Clayburn Pert, MD;  Location: [facility];  Service: General;  Laterality: Right;  . SENTINEL NODE BIOPSY Right 03/21/2017   Procedure: SENTINEL NODE BIOPSY;  Surgeon: Clayburn Pert, MD;  Location: [facility];  Service: General;  Laterality: Right;    FAMILY HISTORY: Family History  Problem Relation Age of Onset  . Colon cancer Mother   . Breast cancer Neg Hx     ADVANCED DIRECTIVES (Y/N):  N  HEALTH MAINTENANCE: Social History   Tobacco Use  . Smoking status: Former Smoker    Packs/day: 0.50    Types: Cigarettes    Quit date: 07/23/1988    Years since quitting: 30.8  . Smokeless tobacco: Never Used  Substance Use Topics  . Alcohol use: Yes    Comment: rare  . Drug use: No     Colonoscopy:  PAP:  Bone density:  Lipid panel:  Allergies  Allergen Reactions  . Nsaids     Bleeding risk  . Penicillins Anaphylaxis, Swelling and Rash    Has patient had a PCN reaction causing immediate rash, facial/tongue/throat swelling, SOB or lightheadedness with hypotension: Yes Has patient had a PCN reaction causing severe rash involving mucus membranes or skin necrosis: No Has patient had a PCN reaction that required hospitalization: No  Has patient had a PCN reaction occurring within the last 10 years: No If all of the above answers are "NO",  then may proceed with Cephalosporin use.   . Atorvastatin Other (See Comments)    Muscle Pain  . Gabapentin Swelling  . Ezetimibe Other (See Comments)    Muscle pain  . Aspirin     GI bleeding risk    Current Outpatient Medications  Medication Sig Dispense Refill  . ADVAIR DISKUS 500-50 MCG/DOSE AEPB Inhale 1 puff into the lungs 2 (two) times daily.     Marland Kitchen albuterol (PROVENTIL HFA;VENTOLIN HFA) 108 (90 Base) MCG/ACT inhaler Inhale 2 puffs into the lungs every 6 (six) hours as needed for wheezing or shortness of breath.    Marland Kitchen aspirin EC 81 MG tablet Take 81 mg by mouth daily.    . baclofen (LIORESAL) 10 MG tablet Take 10 mg by mouth at bedtime.  9  . Calcium-Vitamin D-Vitamin K 062-694-85 MG-UNT-MCG TABS Take 1 tablet by mouth daily.    . carvedilol (COREG) 6.25 MG tablet Take 1 tablet (6.25 mg total) by mouth 2 (two) times daily. 180 tablet 3  . cholecalciferol (VITAMIN D3) 25 MCG (1000 UT) tablet Take 1,000 Units by mouth daily.    . clopidogrel (PLAVIX) 75 MG tablet Take 1 tablet (75 mg total) by mouth daily with  breakfast. 90 tablet 4  . Coenzyme Q10 300 MG CAPS Take 300 mg by mouth daily.     . DULoxetine (CYMBALTA) 30 MG capsule Take 30 mg by mouth every morning.   11  . DULoxetine (CYMBALTA) 60 MG capsule Take 60 mg by mouth daily.   11  . enalapril (VASOTEC) 5 MG tablet Take 1 tablet (5 mg total) by mouth daily. 90 tablet 3  . fluticasone (FLONASE) 50 MCG/ACT nasal spray Place 2 sprays into the nose daily.    . furosemide (LASIX) 20 MG tablet Take 1 tablet (20 mg total) by mouth as needed for edema. 30 tablet 11  . letrozole (FEMARA) 2.5 MG tablet TAKE 1 TABLET BY MOUTH EVERY DAY 90 tablet 3  . lidocaine-prilocaine (EMLA) cream Apply 1 application as needed topically (for port access).    . magnesium oxide (MAG-OX) 400 MG tablet Take 400 mg by mouth daily.    . Melatonin 5 MG TABS Take 10 mg by mouth at bedtime as needed (sleep).    . montelukast (SINGULAIR) 10 MG tablet Take 10  mg by mouth at bedtime.     . pantoprazole (PROTONIX) 40 MG tablet Take 1 tablet (40 mg total) by mouth daily. 90 tablet 3  . potassium chloride SA (KLOR-CON M20) 20 MEQ tablet Take 1 tablet (20 mEq total) by mouth daily. 60 tablet 2  . rosuvastatin (CRESTOR) 20 MG tablet Take 1 tablet (20 mg total) by mouth daily at 6 PM. 90 tablet 4  . spironolactone (ALDACTONE) 25 MG tablet Take 0.5 tablets (12.5 mg total) by mouth daily. 45 tablet 3   No current facility-administered medications for this visit.   Facility-Administered Medications Ordered in Other Visits  Medication Dose Route Frequency Provider Last Rate Last Admin  . 0.9 %  sodium chloride infusion   Intravenous Once Lloyd Huger, MD      . 0.9 %  sodium chloride infusion   Intravenous Once Lloyd Huger, MD      . 0.9 %  sodium chloride infusion   Intravenous Once Lloyd Huger, MD      . 0.9 %  sodium chloride infusion   Intravenous Continuous Grayland Ormond, Kathlene November, MD      . heparin lock flush 100 unit/mL  500 Units Intravenous Once Lloyd Huger, MD      . ondansetron [facility]) 8 mg in sodium chloride 0.9 % 50 mL IVPB   Intravenous Once Lloyd Huger, MD      . sodium chloride flush (NS) 0.9 % injection 10 mL  10 mL Intravenous Once Lloyd Huger, MD        OBJECTIVE: There were no vitals filed for this visit.   There is no height or weight on file to calculate BMI.    ECOG FS:0 - Asymptomatic  General: Well-developed, well-nourished, no acute distress. Eyes: Pink conjunctiva, anicteric sclera. HEENT: Normocephalic, moist mucous membrane. Breast: Bilateral breast and axilla without lumps or masses. Lungs: Clear to auscultation bilaterally. Heart: Regular rate and rhythm. No rubs, murmurs, or gallops. Abdomen: Soft, nontender, nondistended. No organomegaly noted, normoactive bowel sounds. Musculoskeletal: No edema, cyanosis, or clubbing. Neuro: Alert, answering all questions appropriately.  Cranial nerves grossly intact. Skin: No rashes or petechiae noted. Psych: Normal affect.  LAB RESULTS:  Lab Results  Component Value Date   NA 140 05/28/2019   K 4.6 05/28/2019   CL 107 05/28/2019   CO2 28 05/28/2019   GLUCOSE 153 (H) 05/28/2019  BUN 21 05/28/2019   CREATININE 1.50 (H) 05/28/2019   CALCIUM 8.9 05/28/2019   PROT 7.3 03/12/2018   ALBUMIN 3.5 03/12/2018   AST 24 03/12/2018   ALT 14 03/12/2018   ALKPHOS 60 03/12/2018   BILITOT 0.7 03/12/2018   GFRNONAA 33 (L) 05/28/2019   GFRAA 39 (L) 05/28/2019    Lab Results  Component Value Date   WBC 9.0 05/28/2019   NEUTROABS 5.9 05/09/2018   HGB 7.2 (L) 05/28/2019   HCT 27.4 (L) 05/28/2019   MCV 69.2 (L) 05/28/2019   PLT 403 (H) 05/28/2019     STUDIES: DG Chest 2 View  Result Date: 05/28/2019 CLINICAL DATA:  Low hemoglobin, fatigue, cough, chest pain. EXAM: CHEST - 2 VIEW COMPARISON:  07/09/2018 and CT chest 04/09/2018. FINDINGS: Trachea is midline. Heart size normal. Thoracic aorta is calcified. Left subclavian Port-A-Cath is in the SVC. Bullous emphysema with accentuation of the mid and lower lung zone pulmonary markings. No airspace consolidation or pleural fluid. IMPRESSION: 1. No acute findings. 2.  Emphysema (ICD10-J43.9). 3.  Aortic atherosclerosis (ICD10-I70.0). Electronically Signed   By: Lorin Picket M.D.   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  PLAN:    1. 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  Treatment was discontinued secondary to declining performance status.  Patient underwent lumpectomy on March 21, 2017, but no biopsy clip was seen in her surgical specimen.  She had repeat surgery on May 21, 2017 to remove her retained clip,  which confirmed a complete pathological response. Patient completed her adjuvant XRT on September 27, 2017.  Continue letrozole for a total of 5 years completing in February 2024.  Patient's most recent mammogram on Sep 13, 2017 was reported as BI-RADS 2.  Her scheduled mammogram was delayed secondary to COVID-19 and has been rescheduled for December 18, 2018.  Return to clinic in 6 months for routine evaluation.   3. Hypokalemia: Resolved.  Continue 20 mEq oral potassium supplementation daily. 4. Anemia: Patient's most recent hemoglobin was 8.9 in January 2020. 5.  Bone health: Patient had a bone mineral density on July 12, 2017 reported T score of -0.5.  This is considered normal.  Continue calcium and vitamin D supplementation.  Repeat in March 2021. 6.  Cardiac disease: Patient now has 3 stents placed.  Continue follow-up and treatment per cardiology.  I spent a total of 20 minutes face-to-face with the patient of which greater than 50% of the visit was spent in counseling and coordination of care as detailed above.  Patient expressed understanding and was in agreement with this plan. She also understands that She can call clinic at any time with any questions, concerns, or complaints.   Cancer Staging Malignant neoplasm of right female breast [facility]) Staging form: Breast, AJCC 8th Edition - Clinical stage from 10/16/2016: Stage IIB (cT2, cN1, cM0, G3, ER: Negative, PR: Positive, HER2: Positive) - Signed by Lloyd Huger, MD on 10/16/2016   Lloyd Huger, MD   05/30/2019 6:43 AM

## 2019-05-30 NOTE — Progress Notes (Signed)
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  INTERVAL HISTORY: Patient returns to clinic today as an acute add-on for ED follow-up for symptomatic anemia.  No obvious source was determined.  Patient received 1 unit of packed red blood cells and was discharged to home.  She currently feels well and is back to her baseline.  She continues to tolerate letrozole without significant side effects. She has no neurologic complaints. She denies any recent fevers or illnesses.  She denies any chest pain, shortness of breath, cough, or hemoptysis.  She denies any nausea, vomiting, constipation, or diarrhea.  She denies any melena or hematochezia.  She has no urinary complaints.  Patient offers no further specific complaints today.  REVIEW OF SYSTEMS:   Review of Systems  Constitutional: Negative.  Negative for fever, malaise/fatigue and weight loss.  Respiratory: Negative.  Negative for cough and shortness of breath.   Cardiovascular: Negative.  Negative for chest pain and leg swelling.  Gastrointestinal: Negative.  Negative for abdominal pain, constipation, diarrhea and nausea.  Genitourinary: Negative.  Negative for dysuria.  Musculoskeletal: Negative.  Negative for joint pain.  Skin: Negative.  Negative for rash.  Neurological: Negative.  Negative for sensory change, focal weakness, weakness and headaches.  Psychiatric/Behavioral: Negative.  The  patient is not nervous/anxious.     As per HPI. Otherwise, a complete review of systems is negative.  PAST MEDICAL HISTORY: Past Medical History:  Diagnosis Date  . Anxiety   . Arthritis   . CAD (coronary artery disease)    a. NSTEMI 12/19; b. LHC 04/10/18: pLAD 95%, mLAD 80%, m-dLCx 95%, OM3-1 lesion 40%, OM3-2 lesion 60%, mRCA 50%, EF 35-45%, successful PCI/DES x 2 to the LAD with recommended staged PCI of the LCx in a few weeks  . Cancer [facility])    Right Breast Cancer  . Chronic systolic CHF (congestive heart failure) ([place])    a. TTE 12/19: EF 30-35%, anteroseptal, anterior, and apical HK, Gr1DD, mild to mod MR, mildly dilated LA, RVSF nl  . COPD (chronic obstructive pulmonary disease) ([place])   . Depression   . Dyspnea    with exertion  . GERD (gastroesophageal reflux disease)   . Hyperlipidemia   . Hypertension   . Personal history of chemotherapy 2018   chemo prior to lumpectomy of right breast  . Personal history of radiation therapy 2019   right breast ca    PAST SURGICAL HISTORY: Past Surgical History:  Procedure Laterality Date  . ABDOMINAL HYSTERECTOMY  1990   Partial  . BREAST BIOPSY Right 10/04/2016   axilla lymph node (metastatic carcinoma) and axillay tail mass biopsy-invasive mammary carcinoma  . BREAST LUMPECTOMY Right 03/21/2017   chemo first, [facility] and metastatic LN  . BREAST LUMPECTOMY Right 05/21/2017   re-excision for clip  . BREAST LUMPECTOMY WITH NEEDLE LOCALIZATION Right 03/21/2017   Procedure: BREAST LUMPECTOMY WITH NEEDLE LOCALIZATION;  Surgeon: Clayburn Pert, MD;  Location: [facility] ORS;  Service: General;  Laterality: Right;  . CORONARY STENT INTERVENTION N/A 04/10/2018  Procedure: CORONARY STENT INTERVENTION;  Surgeon: Wellington Hampshire, MD;  Location: [place] CV LAB;  Service: Cardiovascular;  Laterality: N/A;  . CORONARY STENT INTERVENTION N/A 05/14/2018   Procedure: CORONARY STENT INTERVENTION;  Surgeon: Nelva Bush, MD;  Location:  [place] CV LAB;  Service: Cardiovascular;  Laterality: N/A;  . DILATION AND CURETTAGE OF UTERUS    . INGUINAL HERNIA REPAIR Right 03/26/2017   Procedure: HERNIA REPAIR INGUINAL INCARCERATED;  Surgeon: Jules Husbands, MD;  Location: [facility] ORS;  Service: General;  Laterality: Right;  . LEFT HEART CATH AND CORONARY ANGIOGRAPHY N/A 04/10/2018   Procedure: LEFT HEART CATH AND CORONARY ANGIOGRAPHY poss PCI;  Surgeon: Minna Merritts, MD;  Location: [place] CV LAB;  Service: Cardiovascular;  Laterality: N/A;  . PORTACATH PLACEMENT Left 10/24/2016   Procedure: INSERTION PORT-A-CATH;  Surgeon: [name], MD;  Location: [facility] ORS;  Service: General;  Laterality: Left;  . RE-EXCISION OF BREAST LUMPECTOMY Right 05/21/2017   Procedure: RE-EXCISION OF BREAST LUMPECTOMY;  Surgeon: Clayburn Pert, MD;  Location: [facility] ORS;  Service: General;  Laterality: Right;  . SENTINEL NODE BIOPSY Right 03/21/2017   Procedure: SENTINEL NODE BIOPSY;  Surgeon: Clayburn Pert, MD;  Location: [facility] ORS;  Service: General;  Laterality: Right;    FAMILY HISTORY: Family History  Problem Relation Age of Onset  . Colon cancer Mother   . Breast cancer Neg Hx     ADVANCED DIRECTIVES (Y/N):  N  HEALTH MAINTENANCE: Social History   Tobacco Use  . Smoking status: Former Smoker    Packs/day: 0.50    Types: Cigarettes    Quit date: 07/23/1988    Years since quitting: 30.8  . Smokeless tobacco: Never Used  Substance Use Topics  . Alcohol use: Yes    Comment: rare  . Drug use: No     Colonoscopy:  PAP:  Bone density:  Lipid panel:  Allergies  Allergen Reactions  . Nsaids     Bleeding risk  . Penicillins Anaphylaxis, Swelling and Rash    Has patient had a PCN reaction causing immediate rash, facial/tongue/throat swelling, SOB or lightheadedness with hypotension: Yes Has patient had a PCN reaction causing severe rash involving mucus membranes or skin necrosis: No Has patient had a PCN reaction that  required hospitalization: No  Has patient had a PCN reaction occurring within the last 10 years: No If all of the above answers are "NO", then may proceed with Cephalosporin use.   . Atorvastatin Other (See Comments)    Muscle Pain  . Gabapentin Swelling  . Ezetimibe Other (See Comments)    Muscle pain  . Aspirin     GI bleeding risk    Current Outpatient Medications  Medication Sig Dispense Refill  . ADVAIR DISKUS 500-50 MCG/DOSE AEPB Inhale 1 puff into the lungs 2 (two) times daily.     Marland Kitchen albuterol (PROVENTIL HFA;VENTOLIN HFA) 108 (90 Base) MCG/ACT inhaler Inhale 2 puffs into the lungs every 6 (six) hours as needed for wheezing or shortness of breath.    Marland Kitchen aspirin EC 81 MG tablet Take 81 mg by mouth daily.    . baclofen (LIORESAL) 10 MG tablet Take 10 mg by mouth at bedtime.  9  . Calcium-Vitamin D-Vitamin K 010-932-35 MG-UNT-MCG TABS Take 1 tablet by mouth daily.    . carvedilol (COREG) 6.25 MG tablet Take 1 tablet (6.25 mg total) by mouth 2 (two) times daily. 180 tablet 3  . cholecalciferol (VITAMIN D3) 25 MCG (1000 UT) tablet Take  1,000 Units by mouth daily.    . clopidogrel (PLAVIX) 75 MG tablet Take 1 tablet (75 mg total) by mouth daily with breakfast. 90 tablet 4  . Coenzyme Q10 300 MG CAPS Take 300 mg by mouth daily.     . DULoxetine (CYMBALTA) 30 MG capsule Take 30 mg by mouth every morning.   11  . DULoxetine (CYMBALTA) 60 MG capsule Take 60 mg by mouth daily.   11  . enalapril (VASOTEC) 5 MG tablet Take 1 tablet (5 mg total) by mouth daily. 90 tablet 3  . fluticasone (FLONASE) 50 MCG/ACT nasal spray Place 2 sprays into the nose daily.    . furosemide (LASIX) 20 MG tablet Take 1 tablet (20 mg total) by mouth as needed for edema. 30 tablet 11  . letrozole (FEMARA) 2.5 MG tablet TAKE 1 TABLET BY MOUTH EVERY DAY 90 tablet 3  . lidocaine-prilocaine (EMLA) cream Apply 1 application as needed topically (for port access).    . magnesium oxide (MAG-OX) 400 MG tablet Take 400 mg by  mouth daily.    . Melatonin 5 MG TABS Take 10 mg by mouth at bedtime as needed (sleep).    . montelukast (SINGULAIR) 10 MG tablet Take 10 mg by mouth at bedtime.     . pantoprazole (PROTONIX) 40 MG tablet Take 1 tablet (40 mg total) by mouth daily. 90 tablet 3  . potassium chloride SA (KLOR-CON M20) 20 MEQ tablet Take 1 tablet (20 mEq total) by mouth daily. 60 tablet 2  . rosuvastatin (CRESTOR) 20 MG tablet Take 1 tablet (20 mg total) by mouth daily at 6 PM. 90 tablet 4  . spironolactone (ALDACTONE) 25 MG tablet Take 0.5 tablets (12.5 mg total) by mouth daily. 45 tablet 3   No current facility-administered medications for this visit.   Facility-Administered Medications Ordered in Other Visits  Medication Dose Route Frequency Provider Last Rate Last Admin  . 0.9 %  sodium chloride infusion   Intravenous Once Lloyd Huger, MD      . 0.9 %  sodium chloride infusion   Intravenous Once Lloyd Huger, MD      . 0.9 %  sodium chloride infusion   Intravenous Once Lloyd Huger, MD      . 0.9 %  sodium chloride infusion   Intravenous Continuous Grayland Ormond, Kathlene November, MD      . heparin lock flush 100 unit/mL  500 Units Intravenous Once Lloyd Huger, MD      . ondansetron [facility]) 8 mg in sodium chloride 0.9 % 50 mL IVPB   Intravenous Once Lloyd Huger, MD      . sodium chloride flush (NS) 0.9 % injection 10 mL  10 mL Intravenous Once Lloyd Huger, MD        OBJECTIVE: Vitals:   05/30/19 1316  BP: (!) 187/87  Pulse: 77  Resp: 18  Temp: 97.7 F (36.5 C)  SpO2: 97%     Body mass index is 27.01 kg/m.    ECOG FS:0 - Asymptomatic  General: Well-developed, well-nourished, no acute distress. Eyes: Pink conjunctiva, anicteric sclera. HEENT: Normocephalic, moist mucous membranes. Breast: Exam deferred today. Lungs: No audible wheezing or coughing. Heart: Regular rate and rhythm. Abdomen: Soft, nontender, no obvious distention. Musculoskeletal: No edema,  cyanosis, or clubbing. Neuro: Alert, answering all questions appropriately. Cranial nerves grossly intact. Skin: No rashes or petechiae noted. Psych: Normal affect.  LAB RESULTS:  Lab Results  Component Value Date  NA 140 05/28/2019   K 4.6 05/28/2019   CL 107 05/28/2019   CO2 28 05/28/2019   GLUCOSE 153 (H) 05/28/2019   BUN 21 05/28/2019   CREATININE 1.50 (H) 05/28/2019   CALCIUM 8.9 05/28/2019   PROT 7.3 03/12/2018   ALBUMIN 3.5 03/12/2018   AST 24 03/12/2018   ALT 14 03/12/2018   ALKPHOS 60 03/12/2018   BILITOT 0.7 03/12/2018   GFRNONAA 33 (L) 05/28/2019   GFRAA 39 (L) 05/28/2019    Lab Results  Component Value Date   WBC 9.8 05/30/2019   NEUTROABS 6.2 05/30/2019   HGB 8.7 (L) 05/30/2019   HCT 31.5 (L) 05/30/2019   MCV 72.7 (L) 05/30/2019   PLT 367 05/30/2019   Lab Results  Component Value Date   IRON 26 (L) 05/30/2019   TIBC 442 05/30/2019   IRONPCTSAT 6 (L) 05/30/2019   Lab Results  Component Value Date   FERRITIN 5 (L) 05/30/2019     STUDIES: DG Chest 2 View  Result Date: 05/28/2019 CLINICAL DATA:  Low hemoglobin, fatigue, cough, chest pain. EXAM: CHEST - 2 VIEW COMPARISON:  07/09/2018 and CT chest 04/09/2018. FINDINGS: Trachea is midline. Heart size normal. Thoracic aorta is calcified. Left subclavian Port-A-Cath is in the SVC. Bullous emphysema with accentuation of the mid and lower lung zone pulmonary markings. No airspace consolidation or pleural fluid. IMPRESSION: 1. No acute findings. 2.  Emphysema (ICD10-J43.9). 3.  Aortic atherosclerosis (ICD10-I70.0). Electronically Signed   By: Lorin Picket M.D.   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  PLAN:    1. 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  Treatment was discontinued secondary to declining performance status.  Patient underwent lumpectomy on March 21, 2017, but no biopsy clip was seen in her surgical specimen.  She had repeat surgery on May 21, 2017 to remove her retained clip, which confirmed a complete pathological response. Patient completed her adjuvant XRT on September 27, 2017.  Continue letrozole for a total of 5 years completing in February 2024.  Patient's most recent mammogram on December 18, 2018 was reported as BI-RADS 2.  Repeat in August 2021.  3. Hypokalemia: Resolved.  Continue 20 mEq oral potassium supplementation daily. 4.  Anemia: Patient noted to have mild iron deficiency and may benefit from IV iron in the future.  She received 1 unit of packed red blood cells while in the ER earlier this week.  Hemoglobin is 8.7 today which is approximately her baseline.  5.  Bone health: Patient had a bone mineral density on July 12, 2017 reported T score of -0.5.  This is considered normal.  Continue calcium and vitamin D supplementation.  Repeat in March 2021. 6.  Cardiac disease: Patient now has 3 stents placed.  Continue follow-up and treatment per cardiology. 7.  Disposition: Return to clinic in 2 months with repeat laboratory work and further evaluation.   Patient expressed understanding and was in agreement with this plan. She also understands that She can call clinic at any time with any questions, concerns, or complaints.   Cancer Staging Malignant neoplasm of right female breast [facility]) Staging form: Breast, AJCC 8th Edition - Clinical stage from 10/16/2016: Stage IIB (cT2, cN1, cM0, G3, ER: Negative, PR: Positive, HER2:  Positive) - Signed by Lloyd Huger, MD on 10/16/2016   Lloyd Huger, MD   05/30/2019 3:27 PM

## 2019-06-03 ENCOUNTER — Other Ambulatory Visit: Payer: Self-pay | Admitting: Nurse Practitioner

## 2019-06-05 ENCOUNTER — Ambulatory Visit: Payer: Medicare Other | Admitting: Oncology

## 2019-06-05 ENCOUNTER — Inpatient Hospital Stay: Payer: Medicare Other

## 2019-06-06 ENCOUNTER — Other Ambulatory Visit: Payer: Self-pay | Admitting: Cardiovascular Disease

## 2019-06-13 ENCOUNTER — Other Ambulatory Visit: Payer: Self-pay | Admitting: Cardiovascular Disease

## 2019-07-10 ENCOUNTER — Other Ambulatory Visit: Payer: Self-pay | Admitting: Cardiovascular Disease

## 2019-07-10 DIAGNOSIS — I2510 Atherosclerotic heart disease of native coronary artery without angina pectoris: Secondary | ICD-10-CM

## 2019-07-10 MED ORDER — SPIRONOLACTONE 25 MG PO TABS: 12.5000 mg | ORAL_TABLET | Freq: Every day | ORAL | 1 refills | Status: DC

## 2019-07-10 NOTE — Telephone Encounter (Signed)
*  STAT* If patient is at the pharmacy, call can be transferred to refill team.   1. Which medications need to be refilled? (please list name of each medication and dose if known) Spironolactone  12.5 mg po q d   2. Which pharmacy/location (including street and city if local pharmacy) is medication to be sent to?  [facility] [address] [place]   3. Do they need a 30 day or 90 day supply? [place]

## 2019-07-10 NOTE — Telephone Encounter (Signed)
Requested Prescriptions   Signed Prescriptions Disp Refills   spironolactone (ALDACTONE) 25 MG tablet 45 tablet 1    Sig: Take 0.5 tablets (12.5 mg total) by mouth daily.    Authorizing Provider: Minna Merritts    Ordering User: Raelene Bott, Rosielee Corporan L

## 2019-07-25 NOTE — Progress Notes (Signed)
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  INTERVAL HISTORY: Patient returns to clinic today for repeat laboratory work and further evaluation.  She continues to have chronic weakness and fatigue, but otherwise feels well.  She continues to tolerate letrozole without significant side effects. She has no neurologic complaints. She denies any recent fevers or illnesses.  She denies any chest pain, shortness of breath, cough, or hemoptysis.  She denies any nausea, vomiting, constipation, or diarrhea.  She denies any melena or hematochezia.  She has no urinary complaints.  Patient offers no further specific complaints today.    REVIEW OF SYSTEMS:   Review of Systems  Constitutional: Positive for malaise/fatigue. Negative for fever and weight loss.  Respiratory: Negative.  Negative for cough and shortness of breath.   Cardiovascular: Negative.  Negative for chest pain and leg swelling.  Gastrointestinal: Negative.  Negative for abdominal pain, constipation, diarrhea and nausea.  Genitourinary: Negative.  Negative for dysuria and hematuria.  Musculoskeletal: Negative.  Negative for joint pain.  Skin: Negative.  Negative for rash.  Neurological: Positive for weakness. Negative for sensory change, focal weakness and headaches.  Psychiatric/Behavioral: Negative.  The patient is not nervous/anxious.     As per HPI. Otherwise, a complete review of  systems is negative.  PAST MEDICAL HISTORY: Past Medical History:  Diagnosis Date  . Anxiety   . Arthritis   . CAD (coronary artery disease)    a. NSTEMI 12/19; b. LHC 04/10/18: pLAD 95%, mLAD 80%, m-dLCx 95%, OM3-1 lesion 40%, OM3-2 lesion 60%, mRCA 50%, EF 35-45%, successful PCI/DES x 2 to the LAD with recommended staged PCI of the LCx in a few weeks  . Cancer [facility])    Right Breast Cancer  . Chronic systolic CHF (congestive heart failure) ([place])    a. TTE 12/19: EF 30-35%, anteroseptal, anterior, and apical HK, Gr1DD, mild to mod MR, mildly dilated LA, RVSF nl  . COPD (chronic obstructive pulmonary disease) ([place])   . Depression   . Dyspnea    with exertion  . GERD (gastroesophageal reflux disease)   . Hyperlipidemia   . Hypertension   . Personal history of chemotherapy 2018   chemo prior to lumpectomy of right breast  . Personal history of radiation therapy 2019   right breast ca    PAST SURGICAL HISTORY: Past Surgical History:  Procedure Laterality Date  . ABDOMINAL HYSTERECTOMY  1990   Partial  . BREAST BIOPSY Right 10/04/2016   axilla lymph node (metastatic carcinoma) and axillay tail mass biopsy-invasive mammary carcinoma  . BREAST LUMPECTOMY Right 03/21/2017   chemo first, [facility] and metastatic LN  . BREAST LUMPECTOMY Right 05/21/2017   re-excision for clip  . BREAST LUMPECTOMY WITH NEEDLE LOCALIZATION Right 03/21/2017   Procedure: BREAST LUMPECTOMY WITH NEEDLE LOCALIZATION;  Surgeon: Clayburn Pert, MD;  Location: [facility] ORS;  Service: General;  Laterality: Right;  . CORONARY STENT INTERVENTION N/A 04/10/2018   Procedure: CORONARY STENT INTERVENTION;  Surgeon: Wellington Hampshire, MD;  Location: [place] CV LAB;  Service:  Cardiovascular;  Laterality: N/A;  . CORONARY STENT INTERVENTION N/A 05/14/2018   Procedure: CORONARY STENT INTERVENTION;  Surgeon: Nelva Bush, MD;  Location: [place] CV LAB;  Service: Cardiovascular;  Laterality: N/A;  . DILATION AND  CURETTAGE OF UTERUS    . INGUINAL HERNIA REPAIR Right 03/26/2017   Procedure: HERNIA REPAIR INGUINAL INCARCERATED;  Surgeon: Jules Husbands, MD;  Location: [facility] ORS;  Service: General;  Laterality: Right;  . LEFT HEART CATH AND CORONARY ANGIOGRAPHY N/A 04/10/2018   Procedure: LEFT HEART CATH AND CORONARY ANGIOGRAPHY poss PCI;  Surgeon: Minna Merritts, MD;  Location: [place] CV LAB;  Service: Cardiovascular;  Laterality: N/A;  . PORTACATH PLACEMENT Left 10/24/2016   Procedure: INSERTION PORT-A-CATH;  Surgeon: [name], MD;  Location: [facility] OR[facility];  Service: General;  Laterality: Left;  . RE-EXCISION OF BREAST LUMPECTOMY Right 05/21/2017   Procedure: RE-EXCISION OF BREAST LUMPECTOMY;  Surgeon: Clayburn Pert, MD;  Location: [facility] OR[facility];  Service: General;  Laterality: Right;  . SENTINEL NODE BIOPSY Right 03/21/2017   Procedure: SENTINEL NODE BIOPSY;  Surgeon: Clayburn Pert, MD;  Location: [facility] ORS;  Service: General;  Laterality: Right;    FAMILY HISTORY: Family History  Problem Relation Age of Onset  . Colon cancer Mother   . Breast cancer Neg Hx     ADVANCED DIRECTIVES (Y/N):  N  HEALTH MAINTENANCE: Social History   Tobacco Use  . Smoking status: Former Smoker    Packs/day: 0.50    Types: Cigarettes    Quit date: 07/23/1988    Years since quitting: 31.0  . Smokeless tobacco: Never Used  Substance Use Topics  . Alcohol use: Yes    Comment: rare  . Drug use: No     Colonoscopy:  PAP:  Bone density:  Lipid panel:  Allergies  Allergen Reactions  . Nsaids     Bleeding risk  . Penicillins Anaphylaxis, Swelling and Rash    Has patient had a PCN reaction causing immediate rash, facial/tongue/throat swelling, SOB or lightheadedness with hypotension: Yes Has patient had a PCN reaction causing severe rash involving mucus membranes or skin necrosis: No Has patient had a PCN reaction that required hospitalization: No  Has patient had a PCN reaction occurring within the  last 10 years: No If all of the above answers are "NO", then may proceed with Cephalosporin use.   . Atorvastatin Other (See Comments)    Muscle Pain  . Gabapentin Swelling  . Ezetimibe Other (See Comments)    Muscle pain  . Aspirin     GI bleeding risk    Current Outpatient Medications  Medication Sig Dispense Refill  . ADVAIR DISKUS 500-50 MCG/DOSE AEPB Inhale 1 puff into the lungs 2 (two) times daily.     Marland Kitchen albuterol (PROVENTIL HFA;VENTOLIN HFA) 108 (90 Base) MCG/ACT inhaler Inhale 2 puffs into the lungs every 6 (six) hours as needed for wheezing or shortness of breath.    Marland Kitchen aspirin EC 81 MG tablet Take 81 mg by mouth daily.    . baclofen (LIORESAL) 10 MG tablet Take 10 mg by mouth at bedtime.  9  . Calcium-Vitamin D-Vitamin K 161-096-04 MG-UNT-MCG TABS Take 1 tablet by mouth daily.    . carvedilol (COREG) 6.25 MG tablet TAKE 1 TABLET BY MOUTH TWICE A DAY 180 tablet 2  . cholecalciferol (VITAMIN D3) 25 MCG (1000 UT) tablet Take 1,000 Units by mouth daily.    . clopidogrel (PLAVIX) 75 MG tablet Take 1 tablet (75 mg total) by mouth  daily with breakfast. 90 tablet 4  . Coenzyme Q10 300 MG CAPS Take 300 mg by mouth daily.     . DULoxetine (CYMBALTA) 30 MG capsule Take 30 mg by mouth every morning.   11  . DULoxetine (CYMBALTA) 60 MG capsule Take 60 mg by mouth daily.   11  . enalapril (VASOTEC) 5 MG tablet TAKE 1 TABLET BY MOUTH EVERY DAY 90 tablet 3  . fluticasone (FLONASE) 50 MCG/ACT nasal spray Place 2 sprays into the nose daily.    . furosemide (LASIX) 20 MG tablet Take 1 tablet (20 mg total) by mouth as needed for edema. 30 tablet 11  . letrozole (FEMARA) 2.5 MG tablet TAKE 1 TABLET BY MOUTH EVERY DAY 90 tablet 3  . lidocaine-prilocaine (EMLA) cream Apply 1 application as needed topically (for port access).    . magnesium oxide (MAG-OX) 400 MG tablet Take 400 mg by mouth daily.    . Melatonin 5 MG TABS Take 10 mg by mouth at bedtime as needed (sleep).    . Melatonin-Pyridoxine 5-1  MG TABS Take 1 tablet by mouth daily.    . montelukast (SINGULAIR) 10 MG tablet Take 10 mg by mouth at bedtime.     . pantoprazole (PROTONIX) 40 MG tablet Take 1 tablet (40 mg total) by mouth daily. 90 tablet 3  . potassium chloride SA (KLOR-CON M20) 20 MEQ tablet Take 1 tablet (20 mEq total) by mouth daily. 60 tablet 2  . rosuvastatin (CRESTOR) 20 MG tablet Take 1 tablet (20 mg total) by mouth daily at 6 PM. 90 tablet 4  . spironolactone (ALDACTONE) 25 MG tablet Take 0.5 tablets (12.5 mg total) by mouth daily. 45 tablet 1   No current facility-administered medications for this visit.   Facility-Administered Medications Ordered in Other Visits  Medication Dose Route Frequency Provider Last Rate Last Admin  . 0.9 %  sodium chloride infusion   Intravenous Once Lloyd Huger, MD      . 0.9 %  sodium chloride infusion   Intravenous Once Lloyd Huger, MD      . 0.9 %  sodium chloride infusion   Intravenous Once Lloyd Huger, MD      . 0.9 %  sodium chloride infusion   Intravenous Continuous Grayland Ormond, Kathlene November, MD      . 0.9 %  sodium chloride infusion   Intravenous Once Lloyd Huger, MD      . heparin lock flush 100 unit/mL  500 Units Intravenous Once Lloyd Huger, MD      . heparin lock flush 100 unit/mL  500 Units Intravenous Once Lloyd Huger, MD      . iron sucrose (VENOFER) injection 200 mg  200 mg Intravenous Once Lloyd Huger, MD      . ondansetron [facility]) 8 mg in sodium chloride 0.9 % 50 mL IVPB   Intravenous Once Lloyd Huger, MD      . sodium chloride flush (NS) 0.9 % injection 10 mL  10 mL Intravenous Once Lloyd Huger, MD        OBJECTIVE: Vitals:   08/01/19 1321  BP: 135/62  Pulse: 73  Resp: 20  Temp: (!) 96.9 F (36.1 C)  SpO2: 97%     Body mass index is 27.4 kg/m.    ECOG FS:0 - Asymptomatic  General: Well-developed, well-nourished, no acute distress. Eyes: Pink conjunctiva, anicteric sclera. HEENT:  Normocephalic, moist mucous membranes. Breast: Exam deferred today. Lungs: No audible  wheezing or coughing. Heart: Regular rate and rhythm. Abdomen: Soft, nontender, no obvious distention. Musculoskeletal: No edema, cyanosis, or clubbing. Neuro: Alert, answering all questions appropriately. Cranial nerves grossly intact. Skin: No rashes or petechiae noted. Psych: Normal affect.   LAB RESULTS:  Lab Results  Component Value Date   NA 140 05/28/2019   K 4.6 05/28/2019   CL 107 05/28/2019   CO2 28 05/28/2019   GLUCOSE 153 (H) 05/28/2019   BUN 21 05/28/2019   CREATININE 1.50 (H) 05/28/2019   CALCIUM 8.9 05/28/2019   PROT 7.3 03/12/2018   ALBUMIN 3.5 03/12/2018   AST 24 03/12/2018   ALT 14 03/12/2018   ALKPHOS 60 03/12/2018   BILITOT 0.7 03/12/2018   GFRNONAA 33 (L) 05/28/2019   GFRAA 39 (L) 05/28/2019    Lab Results  Component Value Date   WBC 10.4 08/01/2019   NEUTROABS 7.0 08/01/2019   HGB 8.7 (L) 08/01/2019   HCT 31.0 (L) 08/01/2019   MCV 72.3 (L) 08/01/2019   PLT 388 08/01/2019   Lab Results  Component Value Date   IRON 26 (L) 05/30/2019   TIBC 442 05/30/2019   IRONPCTSAT 6 (L) 05/30/2019   Lab Results  Component Value Date   FERRITIN 5 (L) 05/30/2019     STUDIES: No results found.  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  PLAN:    1. 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  Treatment was discontinued secondary to declining performance status.  Patient underwent lumpectomy on March 21, 2017, but no biopsy clip was seen in her surgical specimen.  She had repeat surgery on May 21, 2017 to remove her retained clip, which confirmed a complete pathological response. Patient completed her adjuvant XRT on September 27, 2017.   Continue letrozole for a total of 5 years completing in February 2024.  Patient's most recent mammogram on December 18, 2018 was reported as BI-RADS 2.  Repeat in August 2021.  3. Hypokalemia: Resolved.  Continue 20 mEq oral potassium supplementation daily. 4.  Anemia: Patient's hemoglobin remains decreased at 8.7, but stable.  Previously iron stores were also reduced and today's results are pending.  Patient is mildly symptomatic.  Proceed with 200 mg IV Venofer today.  Return to clinic in 1 and 2 weeks to receive additional IV Venofer.  Patient will then return to clinic in 3 months with repeat laboratory work, further evaluation, and continuation of treatment if necessary.   5.  Bone health: Patient had a bone mineral density on July 12, 2017 reported T score of -0.5.  This is considered normal.  Continue calcium and vitamin D supplementation.  Repeat in August 2021 along with mammogram as above. 6.  Cardiac disease: Patient now has 3 stents placed.  Continue follow-up and treatment per cardiology.  I spent a total of 30 minutes reviewing chart data, face-to-face evaluation with the patient, counseling and coordination of care as detailed above.    Patient expressed understanding and was in agreement with this plan. She also understands that She can call clinic at any time with any questions, concerns, or complaints.   Cancer Staging Malignant neoplasm of right female breast [facility]) Staging form: Breast, AJCC 8th Edition - Clinical stage from 10/16/2016: Stage IIB (cT2, cN1, cM0, G3, ER: Negative, PR: Positive, HER2:  Positive) - Signed by Lloyd Huger, MD on 10/16/2016   Lloyd Huger, MD   08/01/2019 1:52 PM

## 2019-07-31 ENCOUNTER — Other Ambulatory Visit: Payer: Self-pay | Admitting: Emergency Medicine

## 2019-07-31 DIAGNOSIS — D509 Iron deficiency anemia, unspecified: Secondary | ICD-10-CM

## 2019-08-01 ENCOUNTER — Inpatient Hospital Stay (HOSPITAL_BASED_OUTPATIENT_CLINIC_OR_DEPARTMENT_OTHER): Payer: Medicare Other | Admitting: Oncology

## 2019-08-01 ENCOUNTER — Encounter: Payer: Self-pay | Admitting: Oncology

## 2019-08-01 ENCOUNTER — Inpatient Hospital Stay: Payer: Medicare Other

## 2019-08-01 ENCOUNTER — Other Ambulatory Visit: Payer: Self-pay

## 2019-08-01 ENCOUNTER — Inpatient Hospital Stay: Payer: Medicare Other | Attending: Oncology

## 2019-08-01 VITALS — BP 135/62 | HR 73 | Temp 96.9°F | Resp 20 | Wt 149.8 lb

## 2019-08-01 VITALS — BP 139/60 | HR 73 | Temp 96.8°F | Resp 18

## 2019-08-01 DIAGNOSIS — C50011 Malignant neoplasm of nipple and areola, right female breast: Secondary | ICD-10-CM

## 2019-08-01 DIAGNOSIS — D509 Iron deficiency anemia, unspecified: Secondary | ICD-10-CM

## 2019-08-01 DIAGNOSIS — C50411 Malignant neoplasm of upper-outer quadrant of right female breast: Secondary | ICD-10-CM

## 2019-08-01 DIAGNOSIS — Z95828 Presence of other vascular implants and grafts: Secondary | ICD-10-CM

## 2019-08-01 DIAGNOSIS — Z171 Estrogen receptor negative status [ER-]: Secondary | ICD-10-CM | POA: Diagnosis not present

## 2019-08-01 LAB — FERRITIN: Ferritin: 6 ng/mL — ABNORMAL LOW (ref 11–307)

## 2019-08-01 LAB — CBC WITH DIFFERENTIAL/PLATELET
Abs Immature Granulocytes: 0.03 10*3/uL (ref 0.00–0.07)
Basophils Absolute: 0.1 10*3/uL (ref 0.0–0.1)
Basophils Relative: 1 %
Eosinophils Absolute: 0.4 10*3/uL (ref 0.0–0.5)
Eosinophils Relative: 4 %
HCT: 31 % — ABNORMAL LOW (ref 36.0–46.0)
Hemoglobin: 8.7 g/dL — ABNORMAL LOW (ref 12.0–15.0)
Immature Granulocytes: 0 %
Lymphocytes Relative: 17 %
Lymphs Abs: 1.8 10*3/uL (ref 0.7–4.0)
MCH: 20.3 pg — ABNORMAL LOW (ref 26.0–34.0)
MCHC: 28.1 g/dL — ABNORMAL LOW (ref 30.0–36.0)
MCV: 72.3 fL — ABNORMAL LOW (ref 80.0–100.0)
Monocytes Absolute: 1.1 10*3/uL — ABNORMAL HIGH (ref 0.1–1.0)
Monocytes Relative: 10 %
Neutro Abs: 7 10*3/uL (ref 1.7–7.7)
Neutrophils Relative %: 68 %
Platelets: 388 10*3/uL (ref 150–400)
RBC: 4.29 MIL/uL (ref 3.87–5.11)
RDW: 23.6 % — ABNORMAL HIGH (ref 11.5–15.5)
WBC: 10.4 10*3/uL (ref 4.0–10.5)
nRBC: 0 % (ref 0.0–0.2)

## 2019-08-01 LAB — IRON AND TIBC
Iron: 26 ug/dL — ABNORMAL LOW (ref 28–170)
Saturation Ratios: 6 % — ABNORMAL LOW (ref 10.4–31.8)
TIBC: 454 ug/dL — ABNORMAL HIGH (ref 250–450)
UIBC: 428 ug/dL

## 2019-08-01 MED ORDER — IRON SUCROSE 20 MG/ML IV SOLN
200.0000 mg | Freq: Once | INTRAVENOUS | Status: AC
  Administered 2019-08-01: 200 mg via INTRAVENOUS
  Filled 2019-08-01: qty 10

## 2019-08-01 MED ORDER — HEPARIN SOD (PORK) LOCK FLUSH 100 UNIT/ML IV SOLN
INTRAVENOUS | Status: AC
  Filled 2019-08-01: qty 5

## 2019-08-01 MED ORDER — SODIUM CHLORIDE 0.9% FLUSH
10.0000 mL | Freq: Once | INTRAVENOUS | Status: AC
  Administered 2019-08-01: 10 mL via INTRAVENOUS
  Filled 2019-08-01: qty 10

## 2019-08-01 MED ORDER — SODIUM CHLORIDE 0.9 % IV SOLN
Freq: Once | INTRAVENOUS | Status: AC
  Filled 2019-08-01: qty 250

## 2019-08-01 MED ORDER — HEPARIN SOD (PORK) LOCK FLUSH 100 UNIT/ML IV SOLN
500.0000 [IU] | Freq: Once | INTRAVENOUS | Status: AC
  Administered 2019-08-01: 500 [IU] via INTRAVENOUS
  Filled 2019-08-01: qty 5

## 2019-08-01 NOTE — Addendum Note (Signed)
Addended by: Ruthell Rummage A on: 08/01/2019 03:13 PM   Modules accepted: Orders

## 2019-08-01 NOTE — Progress Notes (Signed)
1400: Venofer complete, pt reports "I just have a throbbing headache".  1405: Pt reports that headache is improving.  Approx 1412: Per Dr. Grayland Ormond monitor pt for 20 minutes and may give Tylenol.  1413: Pt states headache has completely resolved, Per Dr. Grayland Ormond okay to not give tylenol.   1450: Pt denies any concerns of complaints at this time. Pt denies headache. No s/s of distress noted. Pt and VS stable, Per Dr. Grayland Ormond okay to discharge pt.

## 2019-08-04 ENCOUNTER — Telehealth: Payer: Self-pay | Admitting: Emergency Medicine

## 2019-08-04 ENCOUNTER — Other Ambulatory Visit: Payer: Self-pay | Admitting: Cardiovascular Disease

## 2019-08-04 DIAGNOSIS — I2510 Atherosclerotic heart disease of native coronary artery without angina pectoris: Secondary | ICD-10-CM

## 2019-08-04 MED ORDER — ROSUVASTATIN CALCIUM 20 MG PO TABS: 20.0000 mg | ORAL_TABLET | Freq: Every day | ORAL | 0 refills | Status: DC

## 2019-08-04 NOTE — Telephone Encounter (Signed)
Requested Prescriptions   Signed Prescriptions Disp Refills   rosuvastatin (CRESTOR) 20 MG tablet 90 tablet 0    Sig: Take 1 tablet (20 mg total) by mouth daily at 6 PM.    Authorizing Provider: Minna Merritts    Ordering User: Raelene Bott, Berenice Oehlert L

## 2019-08-04 NOTE — Telephone Encounter (Signed)
*  STAT* If patient is at the pharmacy, call can be transferred to refill team.   1. Which medications need to be refilled? (please list name of each medication and dose if known)  Rosuvastatin 20 mg po q HS  2. Which pharmacy/location (including street and city if local pharmacy) is medication to be sent to? [facility] [address] [place]   3. Do they need a 30 day or 90 day supply? [place]

## 2019-08-04 NOTE — Telephone Encounter (Signed)
Returned call to patient regarding message left stating that she had bad reaction to iron infusion. Reports that started to have diarrhea Friday night and was up to the bathroom every hour that night and didn't start to have normal BMs until yesterday, 4/11. Please advise.

## 2019-08-04 NOTE — Telephone Encounter (Signed)
Could there be another cause?  Diarrhea is listed at less than 5% of adverse reactions.  If she is dehydrated we can give her IVF this week instead.

## 2019-08-07 ENCOUNTER — Telehealth: Payer: Self-pay | Admitting: Cardiovascular Disease

## 2019-08-07 ENCOUNTER — Other Ambulatory Visit: Payer: Self-pay

## 2019-08-07 MED ORDER — CLOPIDOGREL BISULFATE 75 MG PO TABS: 75.0000 mg | ORAL_TABLET | Freq: Every day | ORAL | 4 refills | Status: DC

## 2019-08-07 NOTE — Telephone Encounter (Signed)
*  STAT* If patient is at the pharmacy, call can be transferred to refill team.   1. Which medications need to be refilled? (please list name of each medication and dose if known) Plavix 75 mg  2. Which pharmacy/location (including street and city if local pharmacy) is [facility] [place]  3. Do they need a 30 day or 90 day supply? [place]

## 2019-08-07 NOTE — Telephone Encounter (Signed)
clopidogrel (PLAVIX) 75 MG tablet 90 tablet 4 08/07/2019    Sig - Route: Take 1 tablet (75 mg total) by mouth daily with breakfast. - Oral   Ph[facility]acy  [id number] [place], [place]

## 2019-08-08 ENCOUNTER — Other Ambulatory Visit: Payer: Self-pay

## 2019-08-08 ENCOUNTER — Inpatient Hospital Stay: Payer: Medicare Other

## 2019-08-08 VITALS — BP 147/82 | HR 75 | Temp 97.0°F | Resp 18

## 2019-08-08 DIAGNOSIS — C50411 Malignant neoplasm of upper-outer quadrant of right female breast: Secondary | ICD-10-CM | POA: Diagnosis not present

## 2019-08-08 DIAGNOSIS — D509 Iron deficiency anemia, unspecified: Secondary | ICD-10-CM

## 2019-08-08 DIAGNOSIS — C50011 Malignant neoplasm of nipple and areola, right female breast: Secondary | ICD-10-CM

## 2019-08-08 MED ORDER — ACETAMINOPHEN 325 MG PO TABS
650.0000 mg | ORAL_TABLET | Freq: Once | ORAL | Status: AC
  Administered 2019-08-08: 650 mg via ORAL
  Filled 2019-08-08: qty 2

## 2019-08-08 MED ORDER — SODIUM CHLORIDE 0.9 % IV SOLN
Freq: Once | INTRAVENOUS | Status: AC
  Filled 2019-08-08: qty 250

## 2019-08-08 MED ORDER — SODIUM CHLORIDE 0.9% FLUSH
10.0000 mL | Freq: Once | INTRAVENOUS | Status: DC | PRN
  Filled 2019-08-08: qty 10

## 2019-08-08 MED ORDER — HEPARIN SOD (PORK) LOCK FLUSH 100 UNIT/ML IV SOLN
500.0000 [IU] | Freq: Once | INTRAVENOUS | Status: AC | PRN
  Administered 2019-08-08: 500 [IU]
  Filled 2019-08-08: qty 5

## 2019-08-08 MED ORDER — HEPARIN SOD (PORK) LOCK FLUSH 100 UNIT/ML IV SOLN
INTRAVENOUS | Status: AC
  Filled 2019-08-08: qty 5

## 2019-08-08 MED ORDER — SODIUM CHLORIDE 0.9 % IV SOLN
Freq: Once | INTRAVENOUS | Status: AC
  Filled 2019-08-08: qty 1000

## 2019-08-08 MED ORDER — IRON SUCROSE 20 MG/ML IV SOLN
200.0000 mg | Freq: Once | INTRAVENOUS | Status: AC
  Administered 2019-08-08: 200 mg via INTRAVENOUS
  Filled 2019-08-08: qty 10

## 2019-08-08 NOTE — Progress Notes (Signed)
08/08/19  Patient with diarrhea - received order for 1 liter NS over 1 hour today  Patient complaint of headache during Venofer dose #1 - received order to add Tylenol 650 mg po x 1 prior to Venofer doses.  T.O. Dr Philmore Pali, PharmD

## 2019-08-15 ENCOUNTER — Inpatient Hospital Stay: Payer: Medicare Other

## 2019-08-15 ENCOUNTER — Other Ambulatory Visit: Payer: Self-pay

## 2019-08-15 VITALS — BP 129/58 | HR 67 | Temp 97.3°F | Resp 18

## 2019-08-15 DIAGNOSIS — C50411 Malignant neoplasm of upper-outer quadrant of right female breast: Secondary | ICD-10-CM

## 2019-08-15 DIAGNOSIS — D509 Iron deficiency anemia, unspecified: Secondary | ICD-10-CM

## 2019-08-15 DIAGNOSIS — C50011 Malignant neoplasm of nipple and areola, right female breast: Secondary | ICD-10-CM

## 2019-08-15 MED ORDER — HEPARIN SOD (PORK) LOCK FLUSH 100 UNIT/ML IV SOLN
INTRAVENOUS | Status: AC
  Filled 2019-08-15: qty 5

## 2019-08-15 MED ORDER — IRON SUCROSE 20 MG/ML IV SOLN
200.0000 mg | Freq: Once | INTRAVENOUS | Status: AC
  Administered 2019-08-15: 12:00:00 200 mg via INTRAVENOUS
  Filled 2019-08-15: qty 10

## 2019-08-15 MED ORDER — HEPARIN SOD (PORK) LOCK FLUSH 100 UNIT/ML IV SOLN
500.0000 [IU] | Freq: Once | INTRAVENOUS | Status: AC | PRN
  Administered 2019-08-15: 500 [IU]
  Filled 2019-08-15: qty 5

## 2019-08-15 MED ORDER — ACETAMINOPHEN 325 MG PO TABS
650.0000 mg | ORAL_TABLET | Freq: Once | ORAL | Status: AC
  Administered 2019-08-15: 650 mg via ORAL
  Filled 2019-08-15: qty 2

## 2019-08-15 MED ORDER — SODIUM CHLORIDE 0.9 % IV SOLN
Freq: Once | INTRAVENOUS | Status: AC
  Filled 2019-08-15: qty 250

## 2019-09-03 ENCOUNTER — Ambulatory Visit: Payer: Medicare Other | Admitting: Family

## 2019-09-03 ENCOUNTER — Encounter: Payer: Self-pay | Admitting: Family

## 2019-09-03 ENCOUNTER — Other Ambulatory Visit: Payer: Self-pay

## 2019-09-03 VITALS — BP 140/68 | HR 67 | Ht 62.0 in | Wt 152.1 lb

## 2019-09-03 DIAGNOSIS — E785 Hyperlipidemia, unspecified: Secondary | ICD-10-CM | POA: Diagnosis not present

## 2019-09-03 DIAGNOSIS — I25119 Atherosclerotic heart disease of native coronary artery with unspecified angina pectoris: Secondary | ICD-10-CM | POA: Diagnosis not present

## 2019-09-03 DIAGNOSIS — I255 Ischemic cardiomyopathy: Secondary | ICD-10-CM

## 2019-09-03 DIAGNOSIS — I5022 Chronic systolic (congestive) heart failure: Secondary | ICD-10-CM | POA: Diagnosis not present

## 2019-09-03 MED ORDER — NITROGLYCERIN 0.4 MG SL SUBL
0.4000 mg | SUBLINGUAL_TABLET | SUBLINGUAL | 6 refills | Status: DC | PRN
Start: 2019-09-03 — End: 2020-11-13

## 2019-09-03 MED ORDER — ISOSORBIDE MONONITRATE ER 30 MG PO TB24
15.0000 mg | ORAL_TABLET | Freq: Every day | ORAL | 1 refills | Status: DC
Start: 2019-09-03 — End: 2019-10-10

## 2019-09-03 NOTE — Patient Instructions (Addendum)
Medication Instructions:  Your physician has recommended you make the following change in your medication:   START Isosorbide mononitrate (Imdur) 15mg  (half tablet) daily at bedtime  A prescription for as-needed nitroglycerin for chest pain  *If you need a refill on your cardiac medications before your next appointment, please call your pharmacy*  Lab Work: No lab work today.   Testing/Procedures: Your EKG today shows normal sinus rhythm with no changes. This is a great result!  Follow-Up: At [facility], you and your health needs are our priority.  As part of our continuing mission to provide you with exceptional heart care, we have created designated Provider Care Teams.  These Care Teams include your primary Cardiologist (physician) and Advanced Practice Providers (APPs -  Physician Assistants and Nurse Practitioners) who all work together to provide you with the care you need, when you need it.  We recommend signing up for the patient portal called "MyChart".  Sign up information is provided on this After Visit Summary.  MyChart is used to connect with patients for Virtual Visits (Telemedicine).  Patients are able to view lab/test results, encounter notes, upcoming appointments, etc.  Non-urgent messages can be sent to your provider as well.   To learn more about what you can do with MyChart, go to [email or web address].    Your next appointment:   2 month(s)  The format for your next appointment:   In Person  Provider:   You may see Ida Rogue, MD or one of the following Advanced Practice Providers on your designated Care Team:    Murray Hodgkins, NP  Christell Faith, PA-C  Marrianne Mood, PA-C  Laurann Montana, NP  Other Instructions  Your chest pain is likely a combination of muscle pain, anxiety, and maybe some coronary (heart artery) spasm. This is very common. Your EKG is reassuring. Typical pain related to heart blockages happen primarily when we are up moving  around or doing something. Your symptoms are atypical for new or worsening blockage.   If you decide on cardiac rehab, just let us know.   Isosorbide Mononitrate extended-release tablets What is this medicine? ISOSORBIDE MONONITRATE (eye soe SOR bide mon oh NYE trate) is a vasodilator. It relaxes blood vessels, increasing the blood and oxygen supply to your heart. This medicine is used to prevent chest pain caused by angina. It will not help to stop an episode of chest pain. This medicine may be used for other purposes; ask your health care provider or pharmacist if you have questions. COMMON BRAND NAME(S): Imdur, Isotrate ER What should I tell my health care provider before I take this medicine? They need to know if you have any of these conditions:  previous heart attack or heart failure  an unusual or allergic reaction to isosorbide mononitrate, nitrates, other medicines, foods, dyes, or preservatives  pregnant or trying to get pregnant  breast-feeding How should I use this medicine? Take this medicine by mouth with a glass of water. Follow the directions on the prescription label. Do not crush or chew. Take your medicine at regular intervals. Do not take your medicine more often than directed. Do not stop taking this medicine except on the advice of your doctor or health care professional. Talk to your pediatrician regarding the use of this medicine in children. Special care may be needed. Overdosage: If you think you have taken too much of this medicine contact a poison control center or emergency room at once. NOTE: This medicine is only for you. Do  not share this medicine with others. What if I miss a dose? If you miss a dose, take it as soon as you can. If it is almost time for your next dose, take only that dose. Do not take double or extra doses. What may interact with this medicine? Do not take this medicine with any of the following medications:  medicines used to treat  erectile dysfunction (ED) like avanafil, sildenafil, tadalafil, and vardenafil  riociguat This medicine may also interact with the following medications:  medicines for high blood pressure  other medicines for angina or heart failure This list may not describe all possible interactions. Give your health care provider a list of all the medicines, herbs, non-prescription drugs, or dietary supplements you use. Also tell them if you smoke, drink alcohol, or use illegal drugs. Some items may interact with your medicine. What should I watch for while using this medicine? Check your heart rate and blood pressure regularly while you are taking this medicine. Ask your doctor or health care professional what your heart rate and blood pressure should be and when you should contact him or her. Tell your doctor or health care professional if you feel your medicine is no longer working. You may get dizzy. Do not drive, use machinery, or do anything that needs mental alertness until you know how this medicine affects you. To reduce the risk of dizzy or fainting spells, do not sit or stand up quickly, especially if you are an older patient. Alcohol can make you more dizzy, and increase flushing and rapid heartbeats. Avoid alcoholic drinks. Do not treat yourself for coughs, colds, or pain while you are taking this medicine without asking your doctor or health care professional for advice. Some ingredients may increase your blood pressure. What side effects may I notice from receiving this medicine? Side effects that you should report to your doctor or health care professional as soon as possible:  bluish discoloration of lips, fingernails, or palms of hands  irregular heartbeat, palpitations  low blood pressure  nausea, vomiting  persistent headache  unusually weak or tired Side effects that usually do not require medical attention (report to your doctor or health care professional if they continue or are  bothersome):  flushing of the face or neck  rash This list may not describe all possible side effects. Call your doctor for medical advice about side effects. You may report side effects to FDA at 1-800-FDA-1088. Where should I keep my medicine? Keep out of the reach of children. Store between 15 and 30 degrees C (59 and 86 degrees F). Keep container tightly closed. Throw away any unused medicine after the expiration date. NOTE: This sheet is a summary. It may not cover all possible information. If you have questions about this medicine, talk to your doctor, pharmacist, or health care provider.  2020 Elsevier/Gold Standard (2013-02-07 14:48:19)

## 2019-09-03 NOTE — Progress Notes (Signed)
Office Visit    Patient Name: Krista Cisneros Date of Encounter: 09/03/2019  Primary Care Provider:  Sharyne Peach, MD2 Primary Cardiologist:  Ida Rogue, MD Electrophysiologist:  None   Chief Complaint    Krista Cisneros is a 79 y.o. female with a hx of CAD s/p NSTEMI 03/2018 s/p PCI/DESx2 to LAD, ICM/HFrEF, HTN, HLD, breast cancer s/p chemoradiation, GERD presents today for chest pain.    Past Medical History    Past Medical History:  Diagnosis Date  . Anxiety   . Arthritis   . CAD (coronary artery disease)    a. NSTEMI 12/19; b. LHC 04/10/18: pLAD 95%, mLAD 80%, m-dLCx 95%, OM3-1 lesion 40%, OM3-2 lesion 60%, mRCA 50%, EF 35-45%, successful PCI/DES x 2 to the LAD with recommended staged PCI of the LCx in a few weeks  . Cancer [facility])    Right Breast Cancer  . Chronic systolic CHF (congestive heart failure) ([place])    a. TTE 12/19: EF 30-35%, anteroseptal, anterior, and apical HK, Gr1DD, mild to mod MR, mildly dilated LA, RVSF nl  . COPD (chronic obstructive pulmonary disease) ([place])   . Depression   . Dyspnea    with exertion  . GERD (gastroesophageal reflux disease)   . Hyperlipidemia   . Hypertension   . Personal history of chemotherapy 2018   chemo prior to lumpectomy of right breast  . Personal history of radiation therapy 2019   right breast ca   Past Surgical History:  Procedure Laterality Date  . ABDOMINAL HYSTERECTOMY  1990   Partial  . BREAST BIOPSY Right 10/04/2016   axilla lymph node (metastatic carcinoma) and axillay tail mass biopsy-invasive mammary carcinoma  . BREAST LUMPECTOMY Right 03/21/2017   chemo first, [facility] and metastatic LN  . BREAST LUMPECTOMY Right 05/21/2017   re-excision for clip  . BREAST LUMPECTOMY WITH NEEDLE LOCALIZATION Right 03/21/2017   Procedure: BREAST LUMPECTOMY WITH NEEDLE LOCALIZATION;  Surgeon: Clayburn Pert, MD;  Location: [facility] ORS;  Service: General;  Laterality: Right;  . CORONARY STENT INTERVENTION N/A  04/10/2018   Procedure: CORONARY STENT INTERVENTION;  Surgeon: Wellington Hampshire, MD;  Location: [place] CV LAB;  Service: Cardiovascular;  Laterality: N/A;  . CORONARY STENT INTERVENTION N/A 05/14/2018   Procedure: CORONARY STENT INTERVENTION;  Surgeon: Nelva Bush, MD;  Location: [place] CV LAB;  Service: Cardiovascular;  Laterality: N/A;  . DILATION AND CURETTAGE OF UTERUS    . INGUINAL HERNIA REPAIR Right 03/26/2017   Procedure: HERNIA REPAIR INGUINAL INCARCERATED;  Surgeon: Jules Husbands, MD;  Location: [facility] ORS;  Service: General;  Laterality: Right;  . LEFT HEART CATH AND CORONARY ANGIOGRAPHY N/A 04/10/2018   Procedure: LEFT HEART CATH AND CORONARY ANGIOGRAPHY poss PCI;  Surgeon: Minna Merritts, MD;  Location: [place] CV LAB;  Service: Cardiovascular;  Laterality: N/A;  . PORTACATH PLACEMENT Left 10/24/2016   Procedure: INSERTION PORT-A-CATH;  Surgeon: [name], MD;  Location: [facility] ORS;  Service: General;  Laterality: Left;  . RE-EXCISION OF BREAST LUMPECTOMY Right 05/21/2017   Procedure: RE-EXCISION OF BREAST LUMPECTOMY;  Surgeon: Clayburn Pert, MD;  Location: [facility] ORS;  Service: General;  Laterality: Right;  . SENTINEL NODE BIOPSY Right 03/21/2017   Procedure: SENTINEL NODE BIOPSY;  Surgeon: Clayburn Pert, MD;  Location: [facility] ORS;  Service: General;  Laterality: Right;    Allergies  Allergies  Allergen Reactions  . Nsaids     Bleeding risk  . Penicillins Anaphylaxis, Swelling and Rash    Has patient  had a PCN reaction causing immediate rash, facial/tongue/throat swelling, SOB or lightheadedness with hypotension: Yes Has patient had a PCN reaction causing severe rash involving mucus membranes or skin necrosis: No Has patient had a PCN reaction that required hospitalization: No  Has patient had a PCN reaction occurring within the last 10 years: No If all of the above answers are "NO", then may proceed with Cephalosporin use.   . Atorvastatin Other  (See Comments)    Muscle Pain  . Gabapentin Swelling  . Ezetimibe Other (See Comments)    Muscle pain  . Aspirin     GI bleeding risk    History of Present Illness    Krista Cisneros is a 79 y.o. female with a hx of CAD s/p NSTEMI 03/2018 s/p PCI/DESx2 to LAD and staged DES to LCx, ICM/HFrEF, HTN, HLD, breast cancer s/p chemoradiation, GERD, tobacco use. She was last seen 02/10/19 by Dr. Rockey Situ.  Admitted 03/2018 with NSTEMI. Echo 04/10/18 with LVEF 30-35%, hypokinesis of anteroseptal, anterior, apical myocardium, gr1DD, LA mildly dilated, mild to moderate MR. Cardiac catheterization 04/10/18 with prox LAD 95% and mid LAD 80% with mid to distal Cx 95%, prox OM3 large vessel 95%, mid RCA 50%. Underwent DESx2 to LAD. Underwent staged intervention with DES to LCx 05/14/18.   Echo 08/28/18 with improvement in LVEF from 35 % to 50-55%, septal wall hypokinesis, gr1DD, RV normal size and function, no significant valvular abnormalities.  Chief complaint today of chest pain on and off for the last month. Occurs both at rest and with activity. Lasts a couple seconds. Does not have nitroglycerin at home. No diaphoresis or chest pain. Sometimes associated with shortness of breath. Tells me it feels like a "milder" version of her previous anginal symptoms.   Just got back from celebrating her granddaughter's graduation from [employer]. Tells me she enjoyed celebrating. Did not some dyspnea on exertion with more than usual walking. Has not participated in cardiac rehab previously.   No edema, orthopnea, PND. Reports no palpitations. No lightheadedness, dizziness, near-syncope.    EKGs/Labs/Other Studies Reviewed:   The following studies were reviewed today:  EKG:  EKG is ordered today.  The ekg ordered today demonstrates NSR 67 bpm with no acute ST/T wave changes.   Recent Labs: 05/28/2019: BUN 21; Creatinine, Ser 1.50; Potassium 4.6; Sodium 140 08/01/2019: Hemoglobin 8.7; Platelets 388  Recent Lipid  Panel    Component Value Date/Time   CHOL 189 04/10/2018 0821   TRIG 77 04/10/2018 0821   HDL 40 (L) 04/10/2018 0821   CHOLHDL 4.7 04/10/2018 0821   VLDL 15 04/10/2018 0821   LDLCALC 134 (H) 04/10/2018 0821    Home Medications   Current Meds  Medication Sig  . ADVAIR DISKUS 500-50 MCG/DOSE AEPB Inhale 1 puff into the lungs 2 (two) times daily.   Marland Kitchen albuterol (PROVENTIL HFA;VENTOLIN HFA) 108 (90 Base) MCG/ACT inhaler Inhale 2 puffs into the lungs every 6 (six) hours as needed for wheezing or shortness of breath.  Marland Kitchen aspirin EC 81 MG tablet Take 81 mg by mouth daily.  . baclofen (LIORESAL) 10 MG tablet Take 10 mg by mouth at bedtime.  . Calcium-Vitamin D-Vitamin K [id number] MG-UNT-MCG TABS Take 1 tablet by mouth daily.  . carvedilol (COREG) 6.25 MG tablet TAKE 1 TABLET BY MOUTH TWICE A DAY  . cholecalciferol (VITAMIN D3) 25 MCG (1000 UT) tablet Take 1,000 Units by mouth daily.  . clopidogrel (PLAVIX) 75 MG tablet Take 1 tablet (75 mg total) by  mouth daily with breakfast.  . Coenzyme Q10 300 MG CAPS Take 300 mg by mouth daily.   . DULoxetine (CYMBALTA) 30 MG capsule Take 30 mg by mouth every morning.   . DULoxetine (CYMBALTA) 60 MG capsule Take 60 mg by mouth daily.   . enalapril (VASOTEC) 5 MG tablet TAKE 1 TABLET BY MOUTH EVERY DAY  . fluticasone (FLONASE) 50 MCG/ACT nasal spray Place 2 sprays into the nose daily.  . furosemide (LASIX) 20 MG tablet Take 1 tablet (20 mg total) by mouth as needed for edema.  Marland Kitchen letrozole (FEMARA) 2.5 MG tablet TAKE 1 TABLET BY MOUTH EVERY DAY  . lidocaine-prilocaine (EMLA) cream Apply 1 application as needed topically (for port access).  . magnesium oxide (MAG-OX) 400 MG tablet Take 400 mg by mouth daily.  . Melatonin 5 MG TABS Take 10 mg by mouth at bedtime as needed (sleep).  . Melatonin-Pyridoxine 5-1 MG TABS Take 1 tablet by mouth daily.  . montelukast (SINGULAIR) 10 MG tablet Take 10 mg by mouth at bedtime.   . pantoprazole (PROTONIX) 40 MG tablet  Take 1 tablet (40 mg total) by mouth daily.  . potassium chloride SA (KLOR-CON M20) 20 MEQ tablet Take 1 tablet (20 mEq total) by mouth daily.  . rosuvastatin (CRESTOR) 20 MG tablet Take 1 tablet (20 mg total) by mouth daily at 6 PM.  . spironolactone (ALDACTONE) 25 MG tablet Take 0.5 tablets (12.5 mg total) by mouth daily.      Review of Systems      Review of Systems  Constitution: Negative for chills, fever and malaise/fatigue.  Cardiovascular: Positive for chest pain and dyspnea on exertion. Negative for leg swelling, near-syncope, orthopnea, palpitations and syncope.  Respiratory: Negative for cough, shortness of breath and wheezing.   Gastrointestinal: Negative for nausea and vomiting.  Neurological: Negative for dizziness, light-headedness and weakness.   All other systems reviewed and are otherwise negative except as noted above.  Physical Exam    VS:  BP 140/68 (BP Location: Left Arm, Patient Position: Sitting, Cuff Size: Normal)   Pulse 67   Ht 5\' 2"  (1.575 m)   Wt 152 lb 2 oz (69 kg)   BMI 27.82 kg/m  , BMI Body mass index is 27.82 kg/m. GEN: Well nourished, well developed, in no acute distress. HEENT: normal. Neck: Supple, no JVD, carotid bruits, or masses. Cardiac: RRR, no murmurs, rubs, or gallops. No clubbing, cyanosis, edema.  Radials/PT 2+ and equal bilaterally.  Respiratory:  Respirations regular and unlabored, clear to auscultation bilaterally. GI: Soft, nontender, nondistended, BS + x 4. MS: No deformity or atrophy. Skin: Warm and dry, no rash. Neuro:  Strength and sensation are intact. Psych: Normal affect.  Assessment & Plan    1. CAD - EKG today NSR 67 bpm. Atypical chest pain occurring at rest lasting only seconds. Stable anginal symptoms. Present GDMT includes aspirin, beta blocker, plavix, statin, PRN nitroglycerin. Reports no bleeding with DAPT. Add Imdur 15mg  daily for stable anginal symptoms. No indication for ischemic evaluation at this time. We  did discuss possible referral to cardiac rehab, which she tells me she will think about. Will follow up via MyChart in 1 week.    2. ICM/HFrEF - Echo 08/28/18 with improvement in LVEF from 35 % to 50-55%, septal wall hypokinesis, gr1DD, RV normal size and function, no significant valvular abnormalities. Euvolemic and well compensated today. NYHA II with DOE. GDMT includes Coreg, Lasix 20mg  daily.  3. HLD, LDL goal <70 - 11/2018  LDL 51. Continue rosuvastatin 20mg  daily.   4. GERD - Reports well controlled on Pantoprazole 40mg  daily.  Disposition: Start Imdur. Follow up in 1 week via MyChart message. Follow up in 2 month(s) with Dr. Rockey Situ or [facility].    Loel Dubonnet, NP 09/03/2019, 8:57 PM

## 2019-09-10 ENCOUNTER — Encounter: Payer: Self-pay | Admitting: Family

## 2019-10-07 ENCOUNTER — Emergency Department: Payer: Medicare Other

## 2019-10-07 ENCOUNTER — Other Ambulatory Visit: Payer: Self-pay

## 2019-10-07 ENCOUNTER — Inpatient Hospital Stay
Admission: EM | Admit: 2019-10-07 | Discharge: 2019-10-10 | DRG: 871 | Disposition: A | Discharge status: Home / Self Care | Payer: Medicare Other | Attending: Internal Medicine | Admitting: Internal Medicine

## 2019-10-07 DIAGNOSIS — I25119 Atherosclerotic heart disease of native coronary artery with unspecified angina pectoris: Secondary | ICD-10-CM | POA: Diagnosis present

## 2019-10-07 DIAGNOSIS — E875 Hyperkalemia: Secondary | ICD-10-CM | POA: Diagnosis present

## 2019-10-07 DIAGNOSIS — Z171 Estrogen receptor negative status [ER-]: Secondary | ICD-10-CM | POA: Diagnosis not present

## 2019-10-07 DIAGNOSIS — N1831 Chronic kidney disease, stage 3a: Secondary | ICD-10-CM | POA: Diagnosis present

## 2019-10-07 DIAGNOSIS — I248 Other forms of acute ischemic heart disease: Secondary | ICD-10-CM | POA: Diagnosis not present

## 2019-10-07 DIAGNOSIS — R6520 Severe sepsis without septic shock: Secondary | ICD-10-CM | POA: Diagnosis present

## 2019-10-07 DIAGNOSIS — A419 Sepsis, unspecified organism: Secondary | ICD-10-CM | POA: Diagnosis not present

## 2019-10-07 DIAGNOSIS — I255 Ischemic cardiomyopathy: Secondary | ICD-10-CM | POA: Diagnosis present

## 2019-10-07 DIAGNOSIS — Z886 Allergy status to analgesic agent status: Secondary | ICD-10-CM

## 2019-10-07 DIAGNOSIS — Z923 Personal history of irradiation: Secondary | ICD-10-CM

## 2019-10-07 DIAGNOSIS — K219 Gastro-esophageal reflux disease without esophagitis: Secondary | ICD-10-CM | POA: Diagnosis present

## 2019-10-07 DIAGNOSIS — C50411 Malignant neoplasm of upper-outer quadrant of right female breast: Secondary | ICD-10-CM | POA: Diagnosis not present

## 2019-10-07 DIAGNOSIS — I130 Hypertensive heart and chronic kidney disease with heart failure and stage 1 through stage 4 chronic kidney disease, or unspecified chronic kidney disease: Secondary | ICD-10-CM | POA: Diagnosis present

## 2019-10-07 DIAGNOSIS — J449 Chronic obstructive pulmonary disease, unspecified: Secondary | ICD-10-CM | POA: Diagnosis present

## 2019-10-07 DIAGNOSIS — I214 Non-ST elevation (NSTEMI) myocardial infarction: Secondary | ICD-10-CM | POA: Diagnosis not present

## 2019-10-07 DIAGNOSIS — Z87892 Personal history of anaphylaxis: Secondary | ICD-10-CM

## 2019-10-07 DIAGNOSIS — I10 Essential (primary) hypertension: Secondary | ICD-10-CM

## 2019-10-07 DIAGNOSIS — N170 Acute kidney failure with tubular necrosis: Secondary | ICD-10-CM | POA: Diagnosis present

## 2019-10-07 DIAGNOSIS — F419 Anxiety disorder, unspecified: Secondary | ICD-10-CM | POA: Diagnosis present

## 2019-10-07 DIAGNOSIS — M1990 Unspecified osteoarthritis, unspecified site: Secondary | ICD-10-CM | POA: Diagnosis present

## 2019-10-07 DIAGNOSIS — F329 Major depressive disorder, single episode, unspecified: Secondary | ICD-10-CM | POA: Diagnosis present

## 2019-10-07 DIAGNOSIS — Z79811 Long term (current) use of aromatase inhibitors: Secondary | ICD-10-CM

## 2019-10-07 DIAGNOSIS — N179 Acute kidney failure, unspecified: Secondary | ICD-10-CM | POA: Diagnosis not present

## 2019-10-07 DIAGNOSIS — R778 Other specified abnormalities of plasma proteins: Secondary | ICD-10-CM | POA: Diagnosis not present

## 2019-10-07 DIAGNOSIS — C50911 Malignant neoplasm of unspecified site of right female breast: Secondary | ICD-10-CM | POA: Diagnosis present

## 2019-10-07 DIAGNOSIS — R531 Weakness: Secondary | ICD-10-CM

## 2019-10-07 DIAGNOSIS — I5022 Chronic systolic (congestive) heart failure: Secondary | ICD-10-CM | POA: Diagnosis present

## 2019-10-07 DIAGNOSIS — K5901 Slow transit constipation: Secondary | ICD-10-CM | POA: Diagnosis present

## 2019-10-07 DIAGNOSIS — Z880 Allergy status to penicillin: Secondary | ICD-10-CM

## 2019-10-07 DIAGNOSIS — K5900 Constipation, unspecified: Secondary | ICD-10-CM | POA: Diagnosis not present

## 2019-10-07 DIAGNOSIS — R339 Retention of urine, unspecified: Secondary | ICD-10-CM | POA: Diagnosis present

## 2019-10-07 DIAGNOSIS — Z79890 Hormone replacement therapy (postmenopausal): Secondary | ICD-10-CM

## 2019-10-07 DIAGNOSIS — E860 Dehydration: Secondary | ICD-10-CM

## 2019-10-07 DIAGNOSIS — R6510 Systemic inflammatory response syndrome (SIRS) of non-infectious origin without acute organ dysfunction: Secondary | ICD-10-CM | POA: Diagnosis not present

## 2019-10-07 DIAGNOSIS — Z7902 Long term (current) use of antithrombotics/antiplatelets: Secondary | ICD-10-CM

## 2019-10-07 DIAGNOSIS — K567 Ileus, unspecified: Secondary | ICD-10-CM | POA: Diagnosis not present

## 2019-10-07 DIAGNOSIS — E872 Acidosis: Secondary | ICD-10-CM | POA: Diagnosis present

## 2019-10-07 DIAGNOSIS — N390 Urinary tract infection, site not specified: Secondary | ICD-10-CM | POA: Diagnosis present

## 2019-10-07 DIAGNOSIS — Z7982 Long term (current) use of aspirin: Secondary | ICD-10-CM

## 2019-10-07 DIAGNOSIS — Z888 Allergy status to other drugs, medicaments and biological substances status: Secondary | ICD-10-CM

## 2019-10-07 DIAGNOSIS — I2510 Atherosclerotic heart disease of native coronary artery without angina pectoris: Secondary | ICD-10-CM | POA: Diagnosis not present

## 2019-10-07 DIAGNOSIS — E782 Mixed hyperlipidemia: Secondary | ICD-10-CM | POA: Diagnosis present

## 2019-10-07 DIAGNOSIS — Z955 Presence of coronary angioplasty implant and graft: Secondary | ICD-10-CM

## 2019-10-07 DIAGNOSIS — Z79899 Other long term (current) drug therapy: Secondary | ICD-10-CM

## 2019-10-07 DIAGNOSIS — Z20822 Contact with and (suspected) exposure to covid-19: Secondary | ICD-10-CM | POA: Diagnosis present

## 2019-10-07 DIAGNOSIS — Z87891 Personal history of nicotine dependence: Secondary | ICD-10-CM

## 2019-10-07 DIAGNOSIS — I252 Old myocardial infarction: Secondary | ICD-10-CM

## 2019-10-07 DIAGNOSIS — A4151 Sepsis due to Escherichia coli [E. coli]: Principal | ICD-10-CM | POA: Diagnosis present

## 2019-10-07 LAB — BASIC METABOLIC PANEL
Anion gap: 13 (ref 5–15)
BUN: 50 mg/dL — ABNORMAL HIGH (ref 8–23)
CO2: 23 mmol/L (ref 22–32)
Calcium: 8.9 mg/dL (ref 8.9–10.3)
Chloride: 99 mmol/L (ref 98–111)
Creatinine, Ser: 1.97 mg/dL — ABNORMAL HIGH (ref 0.44–1.00)
GFR calc Af Amer: 28 mL/min — ABNORMAL LOW (ref >60)
GFR calc non Af Amer: 24 mL/min — ABNORMAL LOW (ref >60)
Glucose, Bld: 178 mg/dL — ABNORMAL HIGH (ref 70–99)
Potassium: 5.5 mmol/L — ABNORMAL HIGH (ref 3.5–5.1)
Sodium: 135 mmol/L (ref 135–145)

## 2019-10-07 LAB — CBC
HCT: 46.4 % — ABNORMAL HIGH (ref 36.0–46.0)
Hemoglobin: 14.5 g/dL (ref 12.0–15.0)
MCH: 26.1 pg (ref 26.0–34.0)
MCHC: 31.3 g/dL (ref 30.0–36.0)
MCV: 83.6 fL (ref 80.0–100.0)
Platelets: 273 10*3/uL (ref 150–400)
RBC: 5.55 MIL/uL — ABNORMAL HIGH (ref 3.87–5.11)
RDW: 25.2 % — ABNORMAL HIGH (ref 11.5–15.5)
WBC: 24.2 10*3/uL — ABNORMAL HIGH (ref 4.0–10.5)
nRBC: 0 % (ref 0.0–0.2)

## 2019-10-07 LAB — SARS CORONAVIRUS 2 BY RT PCR (HOSPITAL ORDER, PERFORMED IN ~~LOC~~ HOSPITAL LAB): SARS Coronavirus 2: NEGATIVE

## 2019-10-07 LAB — HEPATIC FUNCTION PANEL
ALT: 16 U/L (ref 0–44)
AST: 30 U/L (ref 15–41)
Albumin: 3.3 g/dL — ABNORMAL LOW (ref 3.5–5.0)
Alkaline Phosphatase: 59 U/L (ref 38–126)
Bilirubin, Direct: <0.1 mg/dL (ref 0.0–0.2)
Total Bilirubin: 0.8 mg/dL (ref 0.3–1.2)
Total Protein: 7.6 g/dL (ref 6.5–8.1)

## 2019-10-07 LAB — MAGNESIUM: Magnesium: 3.1 mg/dL — ABNORMAL HIGH (ref 1.7–2.4)

## 2019-10-07 LAB — APTT: aPTT: 37 seconds — ABNORMAL HIGH (ref 24–36)

## 2019-10-07 LAB — LACTIC ACID, PLASMA
Lactic Acid, Venous: 2.1 mmol/L (ref 0.5–1.9)
Lactic Acid, Venous: 0.9 mmol/L (ref 0.5–1.9)

## 2019-10-07 LAB — PROTIME-INR
INR: 1.2 (ref 0.8–1.2)
Prothrombin Time: 14.4 seconds (ref 11.4–15.2)

## 2019-10-07 LAB — TROPONIN I (HIGH SENSITIVITY)
Troponin I (High Sensitivity): 562 ng/L (ref <18)
Troponin I (High Sensitivity): 484 ng/L (ref <18)

## 2019-10-07 LAB — LIPASE, BLOOD: Lipase: 19 U/L (ref 11–51)

## 2019-10-07 IMAGING — CT CT ABD-PELV W/O CM
2 of 4 series · 16 of 46 positions shown, 18 images · non-contrast
Comparison: CT abdomen and pelvis [DATE].

CLINICAL DATA: Abdominal distension, nausea and vomiting.

EXAM:
CT ABDOMEN AND PELVIS WITHOUT CONTRAST
TECHNIQUE: Multidetector CT imaging of the abdomen and pelvis was performed
following the standard protocol without IV contrast.

[Series 2: routine abd/pel wo · axial · 0.73mm/px · z∈[-590,-150]mm · 13 of 96 slices shown, 15 images]
[im 4/96  soft-tissue]
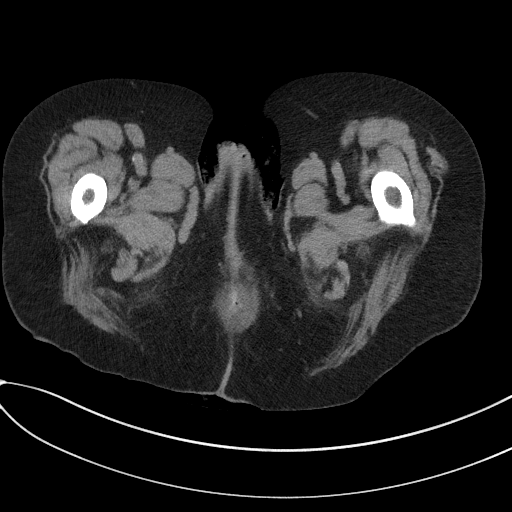
[im 4/96  bone]
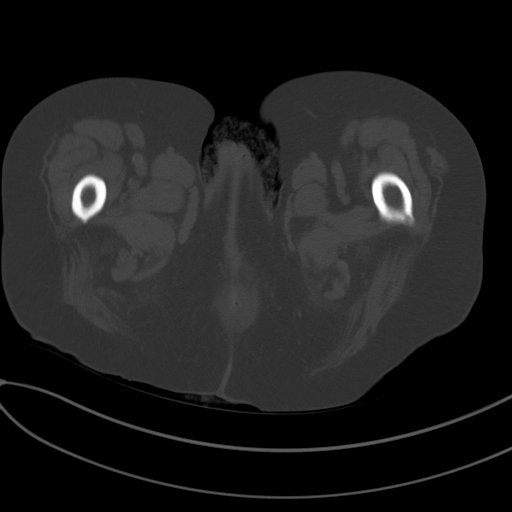
[im 12/96  soft-tissue]
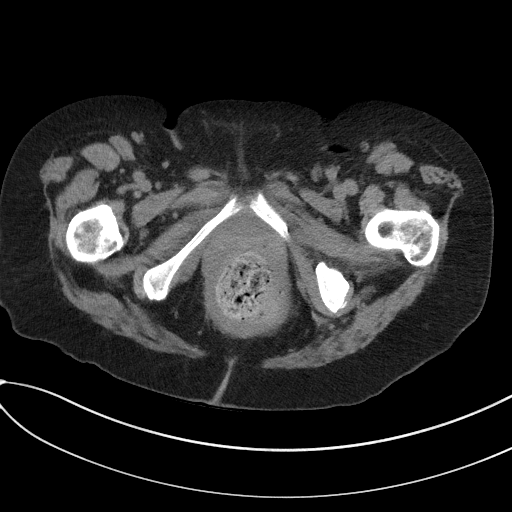
[im 20/96  soft-tissue]
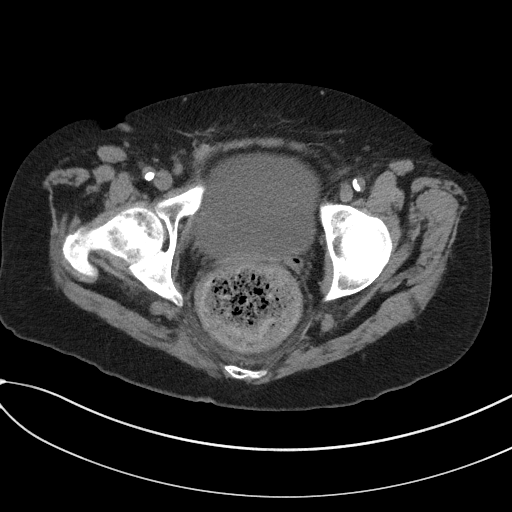
[im 28/96  soft-tissue]
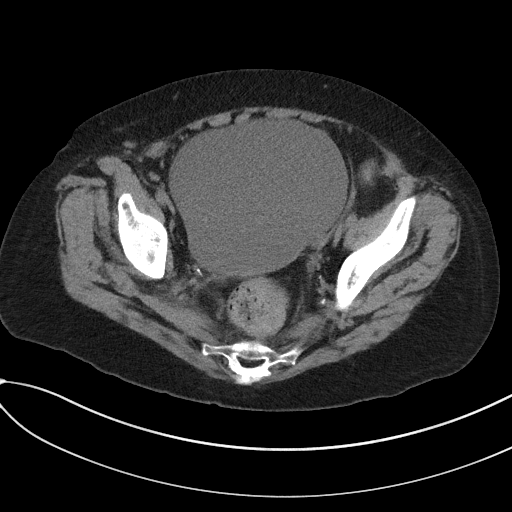
[im 32/96  soft-tissue]
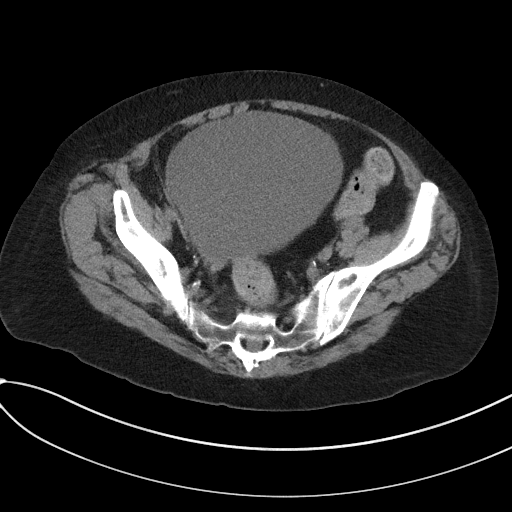
[im 40/96  soft-tissue]
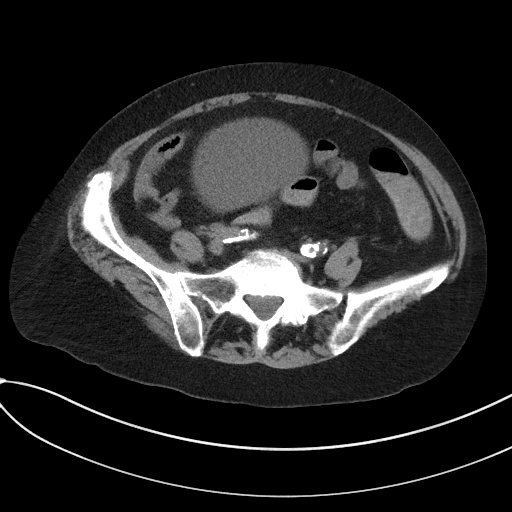
[im 48/96  soft-tissue]
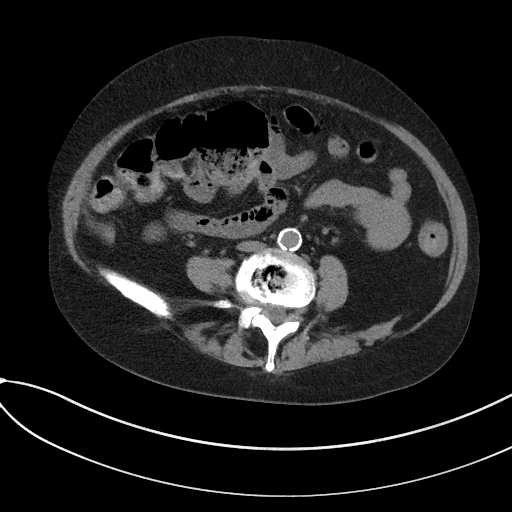
[im 56/96  soft-tissue]
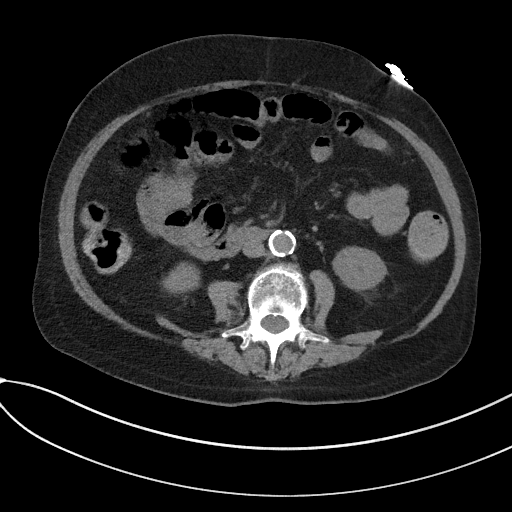
[im 64/96  soft-tissue]
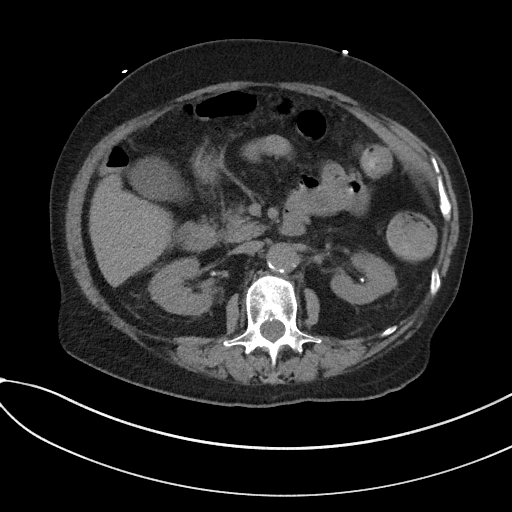
[im 64/96  bone]
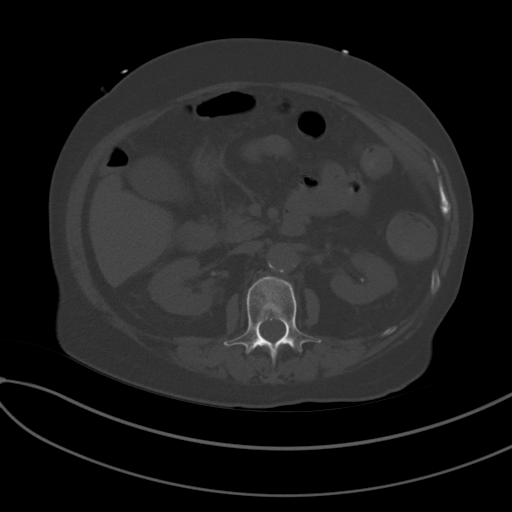
[im 68/96  soft-tissue]
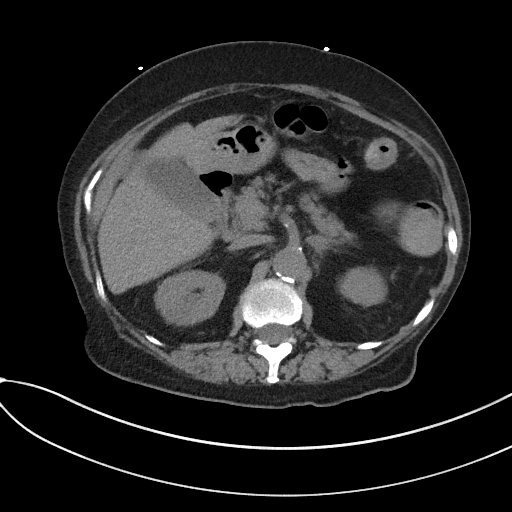
[im 76/96  soft-tissue]
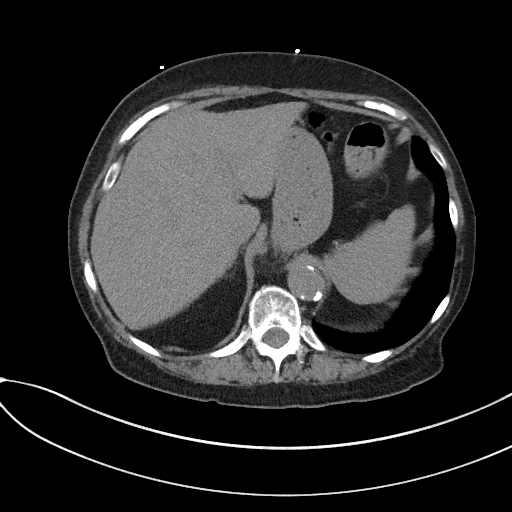
[im 84/96  soft-tissue]
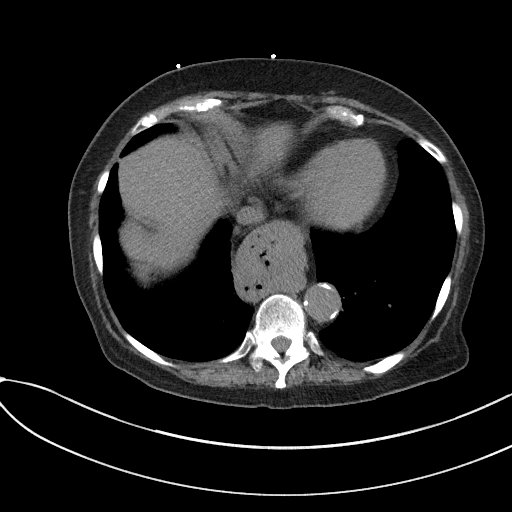
[im 92/96  soft-tissue]
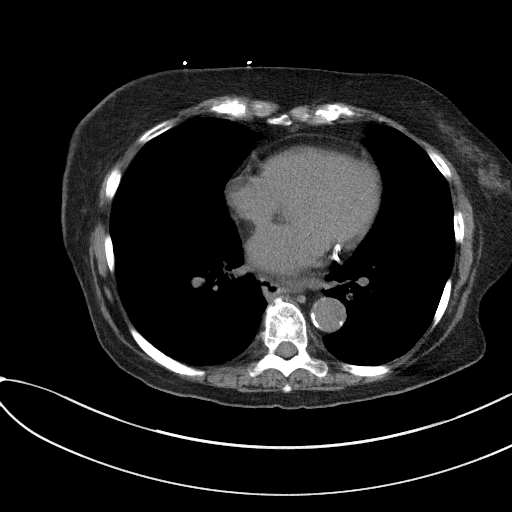

[Series 5: coronal st · coronal · 0.74mm/px · 3 of 94 slices shown]
[im 32/94  soft-tissue]
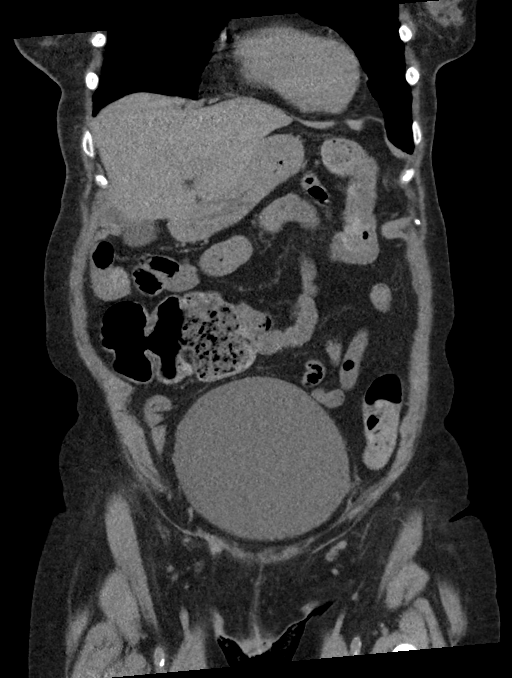
[im 42/94  soft-tissue]
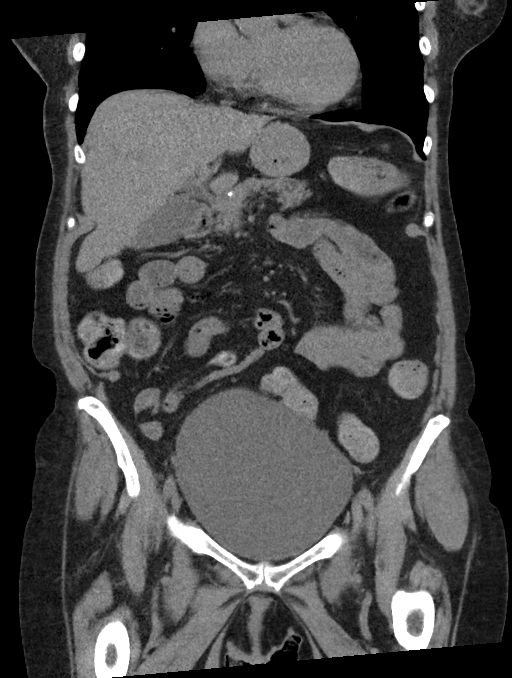
[im 52/94  soft-tissue]
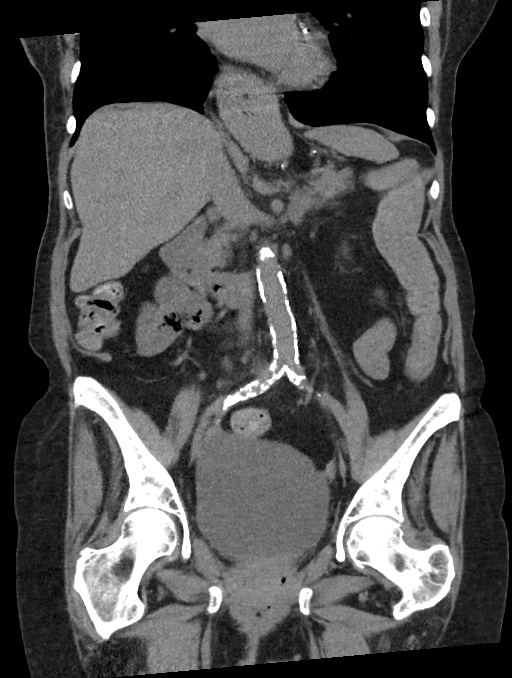

[16 of 46 positions shown; findings below may reference images not displayed]

FINDINGS: Lower chest: Extensive emphysematous disease is present in the lung
bases. No pleural or pericardial effusion. Heart size is normal.
Calcific coronary artery disease noted.

Hepatobiliary: No focal liver abnormality is seen. No gallstones,
gallbladder wall thickening, or biliary dilatation.

Pancreas: Unremarkable. No pancreatic ductal dilatation or
surrounding inflammatory changes.

Spleen: Normal in size without focal abnormality.

Adrenals/Urinary Tract: The adrenal glands appear normal. There is
no hydronephrosis on the right or left. Nonobstructing 0.3 cm stone
in the left kidney is noted. The kidneys otherwise appear normal.
Urinary bladder is distended.

Stomach/Bowel: Stomach is within normal limits. Appendix appears
normal. No evidence of bowel wall thickening, distention, or
inflammatory changes. Large volume of stool in the rectosigmoid
colon noted.

Vascular/Lymphatic: No significant vascular findings are present. No
enlarged abdominal or pelvic lymph nodes.

Reproductive: Status post hysterectomy. No adnexal masses.

Other: None.

Musculoskeletal: No acute abnormality. Convex left lumbar scoliosis
and degenerative disease at L4-5 are seen.
IMPRESSION: No acute abnormality abdomen or pelvis. Distended urinary bladder
noted.

Large volume of stool in the rectosigmoid colon.

Aortic Atherosclerosis ([WR]-[WR]) and Emphysema ([WR]-[WR]).

Small nonobstructing stone left kidney.

## 2019-10-07 IMAGING — DX DG CHEST 1V PORT
1 series · 1 of 1 positions shown · non-contrast
Comparison: [DATE]

CLINICAL DATA: Weakness

EXAM:
PORTABLE CHEST 1 VIEW

[chest ap]
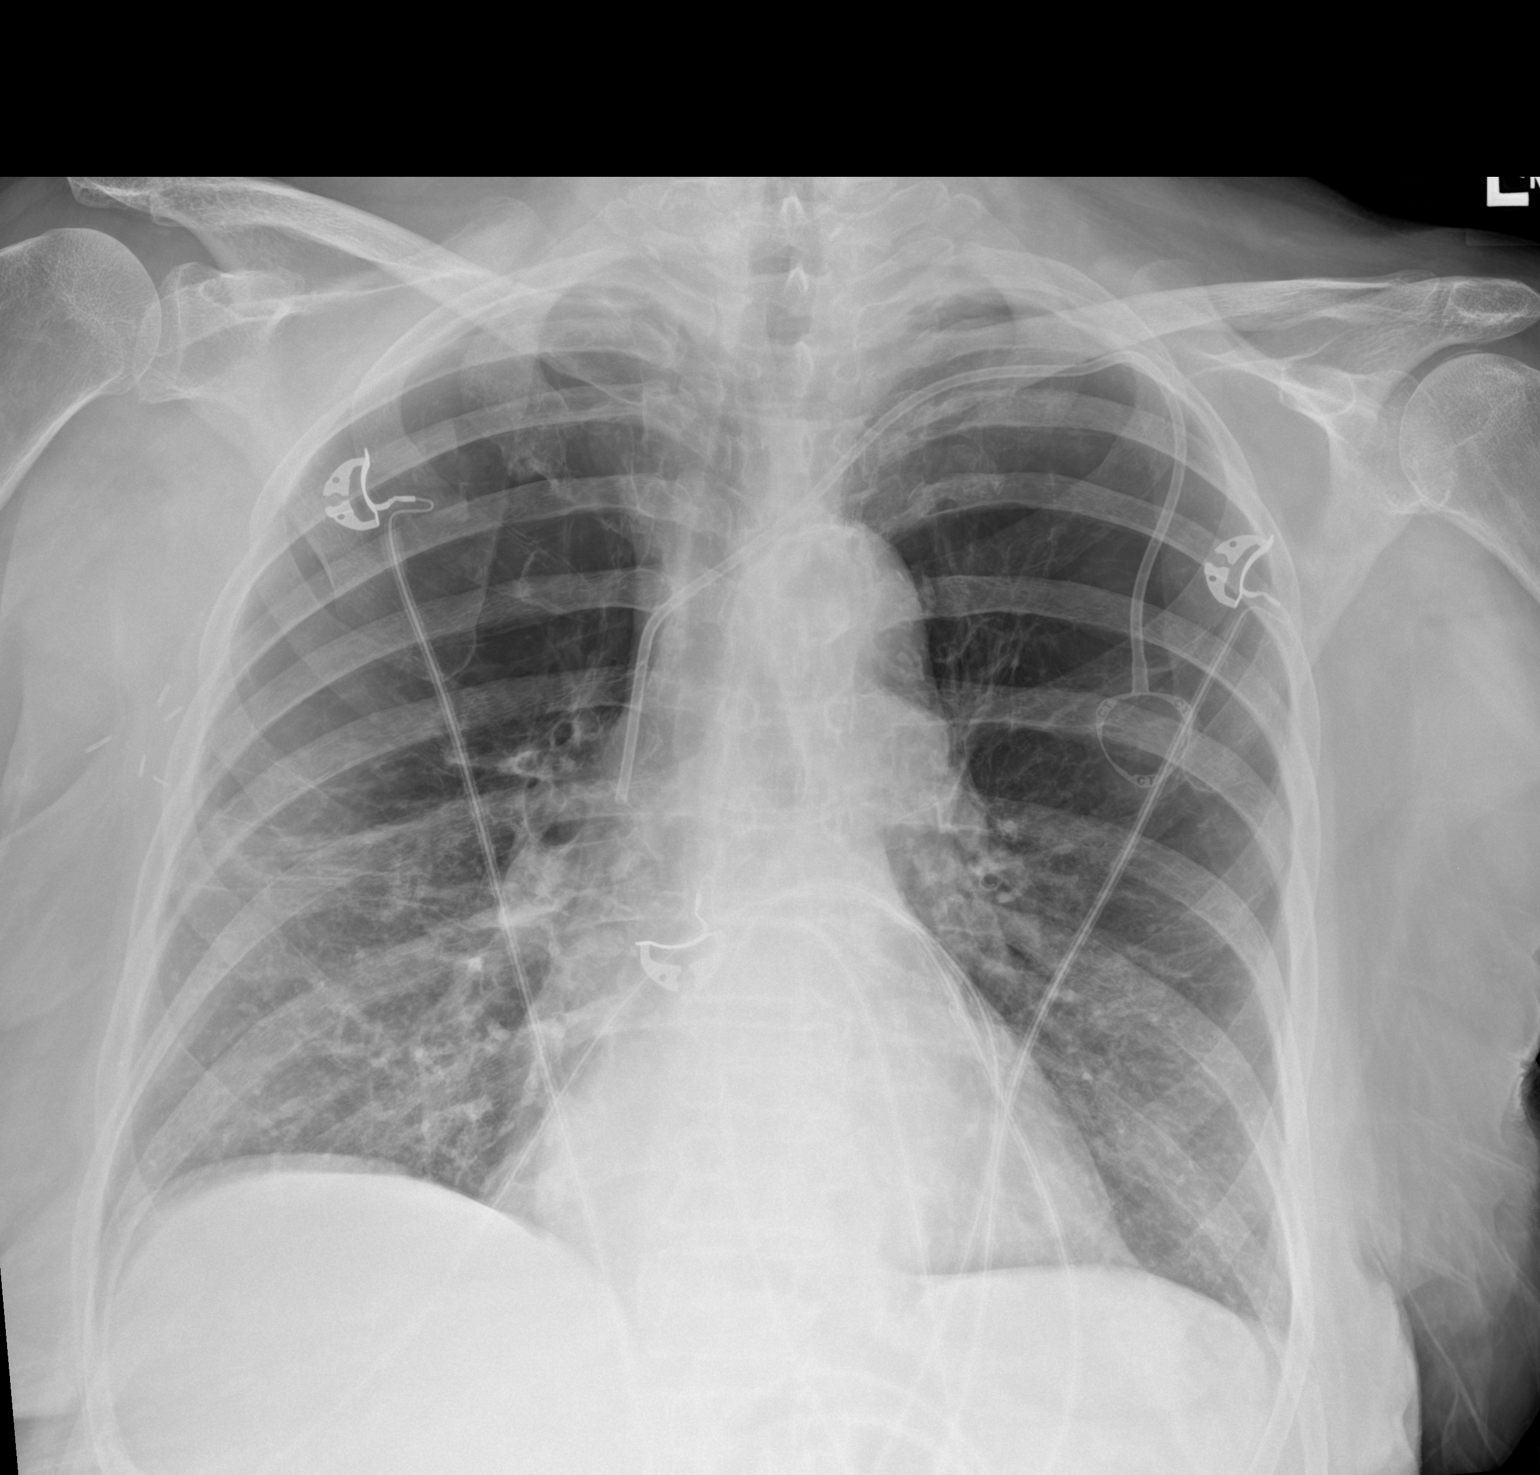

[1 of 1 positions shown; findings below may reference images not displayed]

FINDINGS: Cardiac shadow is within normal limits. Stable aortic calcifications
are seen. Lungs are well aerated bilaterally. Diffuse emphysematous
changes are seen with crowding of markings in the bases bilaterally.
Left chest port is again seen and stable. No bony abnormality is
noted.
IMPRESSION: Changes of COPD without acute abnormality.

Aortic Atherosclerosis ([DU]-[DU]) and Emphysema ([DU]-[DU]).

## 2019-10-07 MED ORDER — HEPARIN (PORCINE) 25000 UT/250ML-% IV SOLN
850.0000 [IU]/h | INTRAVENOUS | Status: DC
  Administered 2019-10-07: 750 [IU]/h via INTRAVENOUS
  Administered 2019-10-09: 800 [IU]/h via INTRAVENOUS
  Administered 2019-10-10: 850 [IU]/h via INTRAVENOUS
  Filled 2019-10-07 (×3): qty 250

## 2019-10-07 MED ORDER — PENTAFLUOROPROP-TETRAFLUOROETH EX AERO
INHALATION_SPRAY | CUTANEOUS | Status: DC | PRN
  Filled 2019-10-07: qty 30

## 2019-10-07 MED ORDER — SODIUM CHLORIDE 0.9% FLUSH: 3.0000 mL | Freq: Once | INTRAVENOUS | Status: DC

## 2019-10-07 MED ORDER — LETROZOLE 2.5 MG PO TABS
2.5000 mg | ORAL_TABLET | Freq: Every day | ORAL | Status: DC
  Administered 2019-10-08 – 2019-10-10 (×3): 2.5 mg via ORAL
  Filled 2019-10-07 (×4): qty 1

## 2019-10-07 MED ORDER — ROSUVASTATIN CALCIUM 10 MG PO TABS
20.0000 mg | ORAL_TABLET | Freq: Every day | ORAL | Status: DC
  Administered 2019-10-08 – 2019-10-09 (×2): 20 mg via ORAL
  Filled 2019-10-07: qty 2
  Filled 2019-10-07: qty 1
  Filled 2019-10-07: qty 2
  Filled 2019-10-07: qty 1

## 2019-10-07 MED ORDER — LIDOCAINE-PRILOCAINE 2.5-2.5 % EX CREA
1.0000 "application " | TOPICAL_CREAM | CUTANEOUS | Status: DC | PRN
  Filled 2019-10-07: qty 5

## 2019-10-07 MED ORDER — SODIUM CHLORIDE 0.9 % IV SOLN: INTRAVENOUS | Status: AC

## 2019-10-07 MED ORDER — METRONIDAZOLE IN NACL 5-0.79 MG/ML-% IV SOLN
500.0000 mg | Freq: Once | INTRAVENOUS | Status: AC
  Administered 2019-10-07: 500 mg via INTRAVENOUS
  Filled 2019-10-07: qty 100

## 2019-10-07 MED ORDER — ACETAMINOPHEN 325 MG PO TABS
650.0000 mg | ORAL_TABLET | Freq: Four times a day (QID) | ORAL | Status: DC | PRN
  Administered 2019-10-10: 650 mg via ORAL
  Filled 2019-10-07: qty 2

## 2019-10-07 MED ORDER — DULOXETINE HCL 30 MG PO CPEP
30.0000 mg | ORAL_CAPSULE | ORAL | Status: DC
  Administered 2019-10-08 – 2019-10-10 (×3): 30 mg via ORAL
  Filled 2019-10-07 (×4): qty 1

## 2019-10-07 MED ORDER — SODIUM CHLORIDE 0.9 % IV SOLN
1.0000 g | Freq: Three times a day (TID) | INTRAVENOUS | Status: DC
  Administered 2019-10-07 – 2019-10-10 (×8): 1 g via INTRAVENOUS
  Filled 2019-10-07 (×11): qty 1

## 2019-10-07 MED ORDER — COENZYME Q10 300 MG PO CAPS: 300.0000 mg | ORAL_CAPSULE | Freq: Every day | ORAL | Status: DC

## 2019-10-07 MED ORDER — ONDANSETRON HCL 4 MG/2ML IJ SOLN: 4.0000 mg | Freq: Four times a day (QID) | INTRAMUSCULAR | Status: DC | PRN

## 2019-10-07 MED ORDER — HEPARIN BOLUS VIA INFUSION
3700.0000 [IU] | Freq: Once | INTRAVENOUS | Status: AC
  Administered 2019-10-07: 3700 [IU] via INTRAVENOUS
  Filled 2019-10-07: qty 3700

## 2019-10-07 MED ORDER — VANCOMYCIN HCL IN DEXTROSE 1-5 GM/200ML-% IV SOLN
1000.0000 mg | Freq: Once | INTRAVENOUS | Status: AC
  Administered 2019-10-07: 1000 mg via INTRAVENOUS
  Filled 2019-10-07: qty 200

## 2019-10-07 MED ORDER — ASPIRIN EC 81 MG PO TBEC
81.0000 mg | DELAYED_RELEASE_TABLET | Freq: Every day | ORAL | Status: DC
  Administered 2019-10-07 – 2019-10-10 (×4): 81 mg via ORAL
  Filled 2019-10-07 (×4): qty 1

## 2019-10-07 MED ORDER — SODIUM CHLORIDE 0.9 % IV SOLN
2.0000 g | Freq: Once | INTRAVENOUS | Status: AC
  Administered 2019-10-07: 2 g via INTRAVENOUS
  Filled 2019-10-07: qty 2

## 2019-10-07 MED ORDER — MAGNESIUM OXIDE 400 (241.3 MG) MG PO TABS
400.0000 mg | ORAL_TABLET | Freq: Every day | ORAL | Status: DC
  Administered 2019-10-07 – 2019-10-10 (×4): 400 mg via ORAL
  Filled 2019-10-07 (×5): qty 1

## 2019-10-07 MED ORDER — MOMETASONE FURO-FORMOTEROL FUM 200-5 MCG/ACT IN AERO
2.0000 | INHALATION_SPRAY | Freq: Two times a day (BID) | RESPIRATORY_TRACT | Status: DC
  Administered 2019-10-08 – 2019-10-10 (×5): 2 via RESPIRATORY_TRACT
  Filled 2019-10-07: qty 8.8

## 2019-10-07 MED ORDER — CLOPIDOGREL BISULFATE 75 MG PO TABS
75.0000 mg | ORAL_TABLET | Freq: Every day | ORAL | Status: DC
  Administered 2019-10-08 – 2019-10-10 (×3): 75 mg via ORAL
  Filled 2019-10-07 (×3): qty 1

## 2019-10-07 MED ORDER — MONTELUKAST SODIUM 10 MG PO TABS
10.0000 mg | ORAL_TABLET | Freq: Every day | ORAL | Status: DC
  Administered 2019-10-07 – 2019-10-09 (×3): 10 mg via ORAL
  Filled 2019-10-07 (×4): qty 1

## 2019-10-07 MED ORDER — VITAMIN D 25 MCG (1000 UNIT) PO TABS
1000.0000 [IU] | ORAL_TABLET | Freq: Every day | ORAL | Status: DC
  Administered 2019-10-07 – 2019-10-10 (×4): 1000 [IU] via ORAL
  Filled 2019-10-07 (×4): qty 1

## 2019-10-07 MED ORDER — SODIUM CHLORIDE 0.9 % IV BOLUS
1000.0000 mL | Freq: Once | INTRAVENOUS | Status: AC
  Administered 2019-10-07: 1000 mL via INTRAVENOUS

## 2019-10-07 MED ORDER — CARVEDILOL 3.125 MG PO TABS
3.1250 mg | ORAL_TABLET | Freq: Two times a day (BID) | ORAL | Status: DC
  Administered 2019-10-07 – 2019-10-09 (×5): 3.125 mg via ORAL
  Filled 2019-10-07 (×6): qty 1

## 2019-10-07 MED ORDER — PANTOPRAZOLE SODIUM 40 MG PO TBEC
40.0000 mg | DELAYED_RELEASE_TABLET | Freq: Every day | ORAL | Status: DC
  Administered 2019-10-07 – 2019-10-10 (×4): 40 mg via ORAL
  Filled 2019-10-07 (×4): qty 1

## 2019-10-07 MED ORDER — ALBUTEROL SULFATE (2.5 MG/3ML) 0.083% IN NEBU: 3.0000 mL | INHALATION_SOLUTION | Freq: Four times a day (QID) | RESPIRATORY_TRACT | Status: DC | PRN

## 2019-10-07 MED ORDER — MELATONIN 5 MG PO TABS
10.0000 mg | ORAL_TABLET | Freq: Every evening | ORAL | Status: DC | PRN
  Filled 2019-10-07: qty 2

## 2019-10-07 MED ORDER — CALCIUM CARBONATE-VITAMIN D 500-200 MG-UNIT PO TABS
1.0000 | ORAL_TABLET | Freq: Every day | ORAL | Status: DC
  Administered 2019-10-08 – 2019-10-10 (×3): 1 via ORAL
  Filled 2019-10-07 (×3): qty 1

## 2019-10-07 MED ORDER — ONDANSETRON HCL 4 MG PO TABS: 4.0000 mg | ORAL_TABLET | Freq: Four times a day (QID) | ORAL | Status: DC | PRN

## 2019-10-07 MED ORDER — NITROGLYCERIN 0.4 MG SL SUBL: 0.4000 mg | SUBLINGUAL_TABLET | SUBLINGUAL | Status: DC | PRN

## 2019-10-07 MED ORDER — METRONIDAZOLE IN NACL 5-0.79 MG/ML-% IV SOLN: 500.0000 mg | Freq: Once | INTRAVENOUS | Status: DC

## 2019-10-07 MED ORDER — ACETAMINOPHEN 650 MG RE SUPP: 650.0000 mg | Freq: Four times a day (QID) | RECTAL | Status: DC | PRN

## 2019-10-07 NOTE — Consult Note (Signed)
Cardiology Consultation:   Patient ID: Ramina Hulet MRN: [id number]; DOB: [birth year]  Admit date: 10/07/2019 Date of Consult: 10/07/2019  Primary Care Provider: Sharyne Peach, MD [facility] Cardiologist: Ida Rogue, MD  [facility] Electrophysiologist:  None    Patient Profile:   Altagracia Rone is a 79 y.o. female with a hx of coronary artery disease with NSTEMI in 03/2018 s/p PCI to the LAD and staged PCI to LCx, chronic HFrEF secondary to ischemic cardiomyopathy with normalization of LVEF (50-55% by echo in 08/2018), hypertension, hyperlipidemia, breast cancer status post chemoradiation, iron deficiency anemia, and GERD who is being seen today for the evaluation of elevated troponin and abnormal EKG at the request of Dr. Ellender Hose.  History of Present Illness:   Ms. Norrington presented to the ED today complaining of a 1-2 week history of constipation and intermittent vague abdominal pain  She also notes intermittent chest pain over the last several months.  She is unable to characterize the chest pain, stating only that it is a pain that sometimes involves the left side of her chest and sometimes the right side.  It happens randomly, usually lasting only a few seconds.  She was started on Imdur by Laurann Montana, NP, at her last visit in our office a month ago.  It is unclear that this has significantly impacted her chest pain.  Ms. Cerros denies shortness of breath, palpitations, and lightheaded.  She has felt "hot" over the last week or two but otherwise denies fevers and chills.  In the ED, Ms. Friend was noted to have elevated HS-TnI (562->484), for which she was started on IV heparin.  Acute kidney injury with a creatinine of 2 (baseline 1-1.2; though it was 1.5 in 05/2019), mild lactic acidosis (2.1), and leukocytosis (WBC 24.2) were also noted.  Currently, Ms. Simone feels well other than continuing to feel constipated.  She also notes difficulty voiding.  CT of the abdomen and  pelvis showed significant amount of stool in the rectosigmoid colon with distended urinary bladder.   Past Medical History:  Diagnosis Date  . Anxiety   . Arthritis   . CAD (coronary artery disease)    a. NSTEMI 12/19; b. LHC 04/10/18: pLAD 95%, mLAD 80%, m-dLCx 95%, OM3-1 lesion 40%, OM3-2 lesion 60%, mRCA 50%, EF 35-45%, successful PCI/DES x 2 to the LAD with recommended staged PCI of the LCx in a few weeks  . Cancer [facility])    Right Breast Cancer  . Chronic systolic CHF (congestive heart failure) ([place])    a. TTE 12/19: EF 30-35%, anteroseptal, anterior, and apical HK, Gr1DD, mild to mod MR, mildly dilated LA, RVSF nl  . COPD (chronic obstructive pulmonary disease) ([place])   . Depression   . Dyspnea    with exertion  . GERD (gastroesophageal reflux disease)   . Hyperlipidemia   . Hypertension   . Personal history of chemotherapy 2018   chemo prior to lumpectomy of right breast  . Personal history of radiation therapy 2019   right breast ca    Past Surgical History:  Procedure Laterality Date  . ABDOMINAL HYSTERECTOMY  1990   Partial  . BREAST BIOPSY Right 10/04/2016   axilla lymph node (metastatic carcinoma) and axillay tail mass biopsy-invasive mammary carcinoma  . BREAST LUMPECTOMY Right 03/21/2017   chemo first, [facility] and metastatic LN  . BREAST LUMPECTOMY Right 05/21/2017   re-excision for clip  . BREAST LUMPECTOMY WITH NEEDLE LOCALIZATION Right 03/21/2017   Procedure: BREAST LUMPECTOMY  WITH NEEDLE LOCALIZATION;  Surgeon: Clayburn Pert, MD;  Location: [facility] ORS;  Service: General;  Laterality: Right;  . CORONARY STENT INTERVENTION N/A 04/10/2018   Procedure: CORONARY STENT INTERVENTION;  Surgeon: Wellington Hampshire, MD;  Location: [place] CV LAB;  Service: Cardiovascular;  Laterality: N/A;  . CORONARY STENT INTERVENTION N/A 05/14/2018   Procedure: CORONARY STENT INTERVENTION;  Surgeon: Nelva Bush, MD;  Location: [place] CV LAB;  Service: Cardiovascular;   Laterality: N/A;  . DILATION AND CURETTAGE OF UTERUS    . INGUINAL HERNIA REPAIR Right 03/26/2017   Procedure: HERNIA REPAIR INGUINAL INCARCERATED;  Surgeon: Jules Husbands, MD;  Location: [facility] ORS;  Service: General;  Laterality: Right;  . LEFT HEART CATH AND CORONARY ANGIOGRAPHY N/A 04/10/2018   Procedure: LEFT HEART CATH AND CORONARY ANGIOGRAPHY poss PCI;  Surgeon: Minna Merritts, MD;  Location: [place] CV LAB;  Service: Cardiovascular;  Laterality: N/A;  . PORTACATH PLACEMENT Left 10/24/2016   Procedure: INSERTION PORT-A-CATH;  Surgeon: [name], MD;  Location: [facility] ORS;  Service: General;  Laterality: Left;  . RE-EXCISION OF BREAST LUMPECTOMY Right 05/21/2017   Procedure: RE-EXCISION OF BREAST LUMPECTOMY;  Surgeon: Clayburn Pert, MD;  Location: [facility] ORS;  Service: General;  Laterality: Right;  . SENTINEL NODE BIOPSY Right 03/21/2017   Procedure: SENTINEL NODE BIOPSY;  Surgeon: Clayburn Pert, MD;  Location: [facility] ORS;  Service: General;  Laterality: Right;     Home Medications:  Prior to Admission medications   Medication Sig Start Date Marriana Hibberd Date Taking? Authorizing Provider  ADVAIR DISKUS 500-50 MCG/DOSE AEPB Inhale 1 puff into the lungs 2 (two) times daily.  05/04/16   [provider]  albuterol (PROVENTIL HFA;VENTOLIN HFA) 108 (90 Base) MCG/ACT inhaler Inhale 2 puffs into the lungs every 6 (six) hours as needed for wheezing or shortness of breath.    [provider]  aspirin EC 81 MG tablet Take 81 mg by mouth daily.    [provider]  baclofen (LIORESAL) 10 MG tablet Take 10 mg by mouth at bedtime. 01/14/17   [provider]  Calcium-Vitamin D-Vitamin K 244-010-27 MG-UNT-MCG TABS Take 1 tablet by mouth daily.    [provider]  carvedilol (COREG) 6.25 MG tablet TAKE 1 TABLET BY MOUTH TWICE A DAY Patient taking differently: Take 6.25 mg by mouth 2 (two) times daily with a meal.  06/13/19   Gollan, Kathlene November, MD  cholecalciferol  (VITAMIN D3) 25 MCG (1000 UT) tablet Take 1,000 Units by mouth daily.    [provider]  clopidogrel (PLAVIX) 75 MG tablet Take 1 tablet (75 mg total) by mouth daily with breakfast. 08/07/19   Minna Merritts, MD  Coenzyme Q10 300 MG CAPS Take 300 mg by mouth daily.     [provider]  DULoxetine (CYMBALTA) 30 MG capsule Take 30 mg by mouth every morning.  05/04/17   [provider]  enalapril (VASOTEC) 5 MG tablet TAKE 1 TABLET BY MOUTH EVERY DAY Patient taking differently: Take 5 mg by mouth daily.  06/06/19   Minna Merritts, MD  fluticasone (FLONASE) 50 MCG/ACT nasal spray Place 2 sprays into the nose daily. 05/23/19 05/22/20  [provider]  furosemide (LASIX) 20 MG tablet Take 1 tablet (20 mg total) by mouth as needed for edema. 04/22/18 09/03/19  Rise Mu, PA-C  isosorbide mononitrate (IMDUR) 30 MG 24 hr tablet Take 0.5 tablets (15 mg total) by mouth daily. 09/03/19 03/01/20  Loel Dubonnet, NP  letrozole (FEMARA) 2.5 MG tablet TAKE 1 TABLET BY MOUTH EVERY DAY Patient taking differently: Take 2.5 mg by mouth daily.  06/03/19   Verlon Au, NP  lidocaine-prilocaine (EMLA) cream Apply 1 application as needed topically (for port access).    [provider]  magnesium oxide (MAG-OX) 400 MG tablet Take 400 mg by mouth daily.    [provider]  Melatonin 5 MG TABS Take 10 mg by mouth at bedtime as needed (sleep).    [provider]  Melatonin-Pyridoxine 5-1 MG TABS Take 1 tablet by mouth daily.    [provider]  montelukast (SINGULAIR) 10 MG tablet Take 10 mg by mouth at bedtime.  05/23/16   [provider]  nitroGLYCERIN (NITROSTAT) 0.4 MG SL tablet Place 1 tablet (0.4 mg total) under the tongue every 5 (five) minutes as needed for chest pain. 09/03/19 12/02/19  Loel Dubonnet, NP  pantoprazole (PROTONIX) 40 MG tablet Take 1 tablet (40 mg total) by mouth daily. 05/15/19   Minna Merritts, MD  potassium  chloride SA (KLOR-CON M20) 20 MEQ tablet Take 1 tablet (20 mEq total) by mouth daily. 05/26/19   Lloyd Huger, MD  rosuvastatin (CRESTOR) 20 MG tablet Take 1 tablet (20 mg total) by mouth daily at 6 PM. 08/04/19   Gollan, Kathlene November, MD  spironolactone (ALDACTONE) 25 MG tablet Take 0.5 tablets (12.5 mg total) by mouth daily. 07/10/19 10/08/19  Minna Merritts, MD  prochlorperazine (COMPAZINE) 10 MG tablet Take 1 tablet (10 mg total) by mouth every 6 (six) hours as needed (Nausea or vomiting). 10/20/16 07/31/17  Lloyd Huger, MD    Inpatient Medications: Scheduled Meds: . sodium chloride flush  3 mL Intravenous Once   Continuous Infusions: . metronidazole 500 mg (10/07/19 1732)  . vancomycin 1,000 mg (10/07/19 1654)   PRN Meds: pentafluoroprop-tetrafluoroeth  Allergies:    Allergies  Allergen Reactions  . Nsaids     Bleeding risk  . Penicillins Anaphylaxis, Swelling and Rash    Has patient had a PCN reaction causing immediate rash, facial/tongue/throat swelling, SOB or lightheadedness with hypotension: Yes Has patient had a PCN reaction causing severe rash involving mucus membranes or skin necrosis: No Has patient had a PCN reaction that required hospitalization: No  Has patient had a PCN reaction occurring within the last 10 years: No If all of the above answers are "NO", then may proceed with Cephalosporin use.   . Atorvastatin Other (See Comments)    Muscle Pain  . Gabapentin Swelling  . Ezetimibe Other (See Comments)    Muscle pain  . Aspirin     GI bleeding risk    Social History:   Social History   Tobacco Use  . Smoking status: Former Smoker    Packs/day: 0.50    Types: Cigarettes    Quit date: 07/23/1988    Years since quitting: 31.2  . Smokeless tobacco: Never Used  Vaping Use  . Vaping Use: Never used  Substance Use Topics  . Alcohol use: Yes    Comment: rare  . Drug use: No    Family History:   Family History  Problem Relation Age of Onset  .  Colon cancer Mother   . Breast cancer Neg Hx      ROS:  Please see the history of present illness. All other ROS reviewed and negative.     Physical Exam/Data:   Vitals:   10/07/19 1341 10/07/19 1419 10/07/19 1530 10/07/19 1735  BP: (!) 89/59 (!) 91/56 (!) 101/56 (!) 112/45  Pulse:   79   Resp:  15 19 (!) 22  Temp:      TempSrc:      SpO2:   100% 100%  Weight:      Height:       No intake or output data in the 24 hours ending 10/07/19 1745 Last 3 Weights 10/07/2019 09/03/2019 08/01/2019  Weight (lbs) 140 lb 152 lb 2 oz 149 lb 12.8 oz  Weight (kg) 63.504 kg 69.003 kg 67.949 kg     Body mass index is 25.61 kg/m.  General:  Well nourished, well developed, in no acute distress HEENT: normal Lymph: no adenopathy Neck: no JVD Endocrine:  No thryomegaly Vascular: No carotid bruits; FA pulses 2+ bilaterally without bruits  Cardiac:  normal S1, S2; RRR; no murmurs Lungs:  Coarse breath sounds with scattered rhochi.  No crackles. Abd: soft, nontender, no hepatomegaly  Ext: no edema Musculoskeletal:  No deformities, BUE and BLE strength normal and equal Skin: warm and dry  Neuro:  CNs 2-12 intact, no focal abnormalities noted Psych:  Normal affect   EKG:  The EKG was personally reviewed and demonstrates:  Normal sinus rhythm with anterolateral T wave inversions and borderline QT prolongation. Telemetry:  Telemetry was personally reviewed and demonstrates:  Sinus rhythm.  Relevant CV Studies: TTE (08/28/2018): Normal LV size and wall thickness.  LVEF 50-55% with septal hypokinesis and grade 1 diastolic dysfunction.  Normal RV size and function.  No significant valvular abnormality.  Laboratory Data:  High Sensitivity Troponin:   Recent Labs  Lab 10/07/19 1146 10/07/19 1435  TROPONINIHS 562* 484*     Chemistry Recent Labs  Lab 10/07/19 1146  NA 135  K 5.5*  CL 99  CO2 23  GLUCOSE 178*  BUN 50*  CREATININE 1.97*  CALCIUM 8.9  GFRNONAA 24*  GFRAA 28*  ANIONGAP 13     Recent Labs  Lab 10/07/19 1146  PROT 7.6  ALBUMIN 3.3*  AST 30  ALT 16  ALKPHOS 59  BILITOT 0.8   Hematology Recent Labs  Lab 10/07/19 1146  WBC 24.2*  RBC 5.55*  HGB 14.5  HCT 46.4*  MCV 83.6  MCH 26.1  MCHC 31.3  RDW 25.2*  PLT 273   BNPNo results for input(s): BNP, PROBNP in the last 168 hours.  DDimer No results for input(s): DDIMER in the last 168 hours.   Radiology/Studies:  CT ABDOMEN PELVIS WO CONTRAST  Result Date: 10/07/2019 CLINICAL DATA:  Abdominal distension, nausea and vomiting. EXAM: CT ABDOMEN AND PELVIS WITHOUT CONTRAST TECHNIQUE: Multidetector CT imaging of the abdomen and pelvis was performed following the standard protocol without IV contrast. COMPARISON:  CT abdomen and pelvis 03/26/2017. FINDINGS: Lower chest: Extensive emphysematous disease is present in the lung bases. No pleural or pericardial effusion. Heart size is normal. Calcific coronary artery disease noted. Hepatobiliary: No focal liver abnormality is seen. No gallstones, gallbladder wall thickening, or biliary dilatation. Pancreas: Unremarkable. No pancreatic ductal dilatation or surrounding inflammatory changes. Spleen: Normal in size without focal abnormality. Adrenals/Urinary Tract: The adrenal glands appear normal. There is no hydronephrosis on the right or left. Nonobstructing 0.3 cm stone in the left kidney is noted. The kidneys otherwise appear normal. Urinary bladder is distended. Stomach/Bowel: Stomach is within normal limits. Appendix appears normal. No evidence of bowel wall thickening, distention, or inflammatory changes. Large volume of stool in the rectosigmoid colon noted. Vascular/Lymphatic: No significant vascular findings are present. No  enlarged abdominal or pelvic lymph nodes. Reproductive: Status post hysterectomy. No adnexal masses. Other: None. Musculoskeletal: No acute abnormality. Convex left lumbar scoliosis and degenerative disease at L4-5 are seen. IMPRESSION: No acute  abnormality abdomen or pelvis. Distended urinary bladder noted. Large volume of stool in the rectosigmoid colon. Aortic Atherosclerosis (ICD10-I70.0) and Emphysema (ICD10-J43.9). Small nonobstructing stone left kidney. Electronically Signed   By: Inge Rise M.D.   On: 10/07/2019 15:18   DG Chest Portable 1 View  Result Date: 10/07/2019 CLINICAL DATA:  Weakness EXAM: PORTABLE CHEST 1 VIEW COMPARISON:  05/28/2019 FINDINGS: Cardiac shadow is within normal limits. Stable aortic calcifications are seen. Lungs are well aerated bilaterally. Diffuse emphysematous changes are seen with crowding of markings in the bases bilaterally. Left chest port is again seen and stable. No bony abnormality is noted. IMPRESSION: Changes of COPD without acute abnormality. Aortic Atherosclerosis (ICD10-I70.0) and Emphysema (ICD10-J43.9). Electronically Signed   By: Inez Catalina M.D.   On: 10/07/2019 14:37   TIMI Risk Score for Unstable Angina or Non-ST Elevation MI:   The patient's TIMI risk score is 5, which indicates a 26% risk of all cause mortality, new or recurrent myocardial infarction or need for urgent revascularization in the next 14 days.   Assessment and Plan:   Elevated troponin: I suspect this is most likely due to supply-demand mismatch.  She has been experiencing atypical chest pain over the last several month, however.  EKG also shows anterolateral T wave inversions that are new since 09/03/2019.  There is no indication for urgent catheterization, particularly given concern for sepsis and acute kidney injury.  However, ischemia evaluation will need to be considered prior to discharge or shortly after leaving hospital, once constipation and suspected infection have resolved.  Okay to continue IV heparin for up to 48 hours.  Obtain echocardiogram.  Continue aspirin and clopidogrel.  Hold carvedilol and isosorbide mononitrate in the setting of borderline hypotension.  Consider noninvasive ischemia  testing prior to or shortly after discharge once acute medical issues have resolved.  Ischemic cardiomyopathy: Ms. Don appears euvolemic on exam.  Most recent echo a year ago showed normalization of LVEF.  Repeat echocardiogram.  Hold carvedilol, enalapril, spironolactone and furosemide in the setting of acute kidney injury and borderline hypotension.  Hyperlipidemia:  Continue rosuvastatin.  Acute kidney injury:  Hold enalapril, spironolactone, furosemide, and potassium chloride  Avoid nephrotoxic drugs.  Consider Foley catheter placement if unable to void adequately, given concern for urinary retention.  Sepsis:  Agree with empiric antibiotics.  Ongoing evaluation for infection source per primary team.  For questions or updates, please contact [place] Please consult [email or web address] for contact info under [facility].  Signed, Nelva Bush, MD  10/07/2019 5:45 PM

## 2019-10-07 NOTE — Progress Notes (Signed)
[place] for heparin Indication: chest pain/ACS  Allergies  Allergen Reactions  . Nsaids     Bleeding risk  . Penicillins Anaphylaxis, Swelling and Rash    Has patient had a PCN reaction causing immediate rash, facial/tongue/throat swelling, SOB or lightheadedness with hypotension: Yes Has patient had a PCN reaction causing severe rash involving mucus membranes or skin necrosis: No Has patient had a PCN reaction that required hospitalization: No  Has patient had a PCN reaction occurring within the last 10 years: No If all of the above answers are "NO", then may proceed with Cephalosporin use.   . Atorvastatin Other (See Comments)    Muscle Pain  . Gabapentin Swelling  . Ezetimibe Other (See Comments)    Muscle pain  . Aspirin     GI bleeding risk    Patient Measurements: Height: 5\' 2"  (157.5 cm) Weight: 63.5 kg (140 lb) IBW/kg (Calculated) : 50.1 Heparin Dosing Weight: 63 kg  Vital Signs: Temp: 98.3 F (36.8 C) (06/15 1132) Temp Source: Axillary (06/15 1132) BP: 125/45 (06/15 1830) Pulse Rate: 82 (06/15 1830)  Labs: Recent Labs    10/07/19 1146 10/07/19 1435  HGB 14.5  --   HCT 46.4*  --   PLT 273  --   CREATININE 1.97*  --   TROPONINIHS 562* 484*    Estimated Creatinine Clearance: 20.6 mL/min (A) (by C-G formula based on SCr of 1.97 mg/dL (H)).   Medical History: Past Medical History:  Diagnosis Date  . Anxiety   . Arthritis   . CAD (coronary artery disease)    a. NSTEMI 12/19; b. LHC 04/10/18: pLAD 95%, mLAD 80%, m-dLCx 95%, OM3-1 lesion 40%, OM3-2 lesion 60%, mRCA 50%, EF 35-45%, successful PCI/DES x 2 to the LAD with recommended staged PCI of the LCx in a few weeks  . Cancer [facility])    Right Breast Cancer  . Chronic systolic CHF (congestive heart failure) ([place])    a. TTE 12/19: EF 30-35%, anteroseptal, anterior, and apical HK, Gr1DD, mild to mod MR, mildly dilated LA, RVSF nl  . COPD (chronic obstructive pulmonary  disease) ([place])   . Depression   . Dyspnea    with exertion  . GERD (gastroesophageal reflux disease)   . Hyperlipidemia   . Hypertension   . Personal history of chemotherapy 2018   chemo prior to lumpectomy of right breast  . Personal history of radiation therapy 2019   right breast ca     Assessment: 79 year old female presented with weakness. Elevated troponin likely s/t supply-demand mismatch per Cardiology. No indication for urgent cath, but may consider ischemic evaluation pending resolution of acute medical problems. Pharmacy consulted for heparin drip. No anticoagulation PTA.  Goal of Therapy:  Heparin level 0.3-0.7 units/ml Monitor platelets by anticoagulation protocol: Yes   Plan:  Heparin 3700 unit bolus followed by drip at 750 units/hr. HL 6/16 at 0400. CBC daily while on heparin drip.  Tawnya Crook, PharmD 10/07/2019,7:03 PM

## 2019-10-07 NOTE — ED Notes (Signed)
Attempted to in&out patient x1 with no success for urine.

## 2019-10-07 NOTE — ED Triage Notes (Signed)
Pt arrives via POV with son in law for reports of weakness, difficulty walking, nausea, diarrhea since last week. Pt with hx of breast cancer, not currently receiving chemo. Last chemo 2 years ago. Pt speaking with this RN in NAD, A&Ox4.

## 2019-10-07 NOTE — H&P (Signed)
History and Physical    DOA: 10/07/2019  PCP: Sharyne Peach, MD  Patient coming from: Home  Chief Complaint: Generalized weakness and fatigue  HPI: Krista Cisneros is a 79 y.o. female with history h/o CAD, chronic systolic CHF with EF 16% (normalized to 55% in May 2020), COPD, right breast cancer s/p chemoradiation, depression/anxiety, hypertension, hyperlipidemia, GERD presented to the ED with complaints of feeling weak over the last couple of days.  Today she woke up feeling very tired and had no energy.  She also reports constipation as well as trouble urinating lately-she describes as having to strain to urinate.  She apparently was having intermittent chest discomfort a week back for which she sought medical attention with her primary cardiologist and was prescribed Imdur about 7 to 10 days back.  She denies any fever or chills.  She denies any chest pain but reports feeling sweaty this morning.  She denies any headaches, melena or hematochezia.  Her last good bowel movement was 3 weeks back.  She takes Aldactone daily but has not taken Lasix (prescribed for as needed use) for couple of months now. ED course: Blood pressure 89/59-118/45, respiratory rate 22, pulse 63, temp 98.36F, O2 sat 96% on room air.  WBC 24.2, hemoglobin 14.5, platelet 273.  Sodium 135, potassium 5.5, chloride 99, bicarb 23, glucose 178, BUN 50, creatinine 1.97 (baseline appears to be around 1.2), calcium 8.9.  Magnesium 3.1, albumin 3.3, lipase 19.  LFTs within normal limits.  Troponin 562->484.  Lactate 2.1->0.9.  Chest x-ray unremarkable.  UA pending.  CT of the abdomen and pelvis showed significant amount of stool burden in rectosigmoid colon, distended urinary bladder, nonobstructing left nephrolithiasis.  Patient suspected to have sepsis, dehydration and started on IV fluids with broad-spectrum IV antibiotics.  Per ED nurse straight cath attempted but patient had just voided in her pants.  Urine sample not sent.   Postvoid residual being checked.  Review of Systems: As per HPI otherwise 10 point review of systems negative.    Past Medical History:  Diagnosis Date  . Anxiety   . Arthritis   . CAD (coronary artery disease)    a. NSTEMI 12/19; b. LHC 04/10/18: pLAD 95%, mLAD 80%, m-dLCx 95%, OM3-1 lesion 40%, OM3-2 lesion 60%, mRCA 50%, EF 35-45%, successful PCI/DES x 2 to the LAD with recommended staged PCI of the LCx in a few weeks  . Cancer [facility])    Right Breast Cancer  . Chronic systolic CHF (congestive heart failure) ([place])    a. TTE 12/19: EF 30-35%, anteroseptal, anterior, and apical HK, Gr1DD, mild to mod MR, mildly dilated LA, RVSF nl  . COPD (chronic obstructive pulmonary disease) ([place])   . Depression   . Dyspnea    with exertion  . GERD (gastroesophageal reflux disease)   . Hyperlipidemia   . Hypertension   . Personal history of chemotherapy 2018   chemo prior to lumpectomy of right breast  . Personal history of radiation therapy 2019   right breast ca    Past Surgical History:  Procedure Laterality Date  . ABDOMINAL HYSTERECTOMY  1990   Partial  . BREAST BIOPSY Right 10/04/2016   axilla lymph node (metastatic carcinoma) and axillay tail mass biopsy-invasive mammary carcinoma  . BREAST LUMPECTOMY Right 03/21/2017   chemo first, [facility] and metastatic LN  . BREAST LUMPECTOMY Right 05/21/2017   re-excision for clip  . BREAST LUMPECTOMY WITH NEEDLE LOCALIZATION Right 03/21/2017   Procedure: BREAST LUMPECTOMY WITH NEEDLE LOCALIZATION;  Surgeon: Clayburn Pert, MD;  Location: [facility] ORS;  Service: General;  Laterality: Right;  . CORONARY STENT INTERVENTION N/A 04/10/2018   Procedure: CORONARY STENT INTERVENTION;  Surgeon: Wellington Hampshire, MD;  Location: [place] [facility] LAB;  Service: Cardiovascular;  Laterality: N/A;  . CORONARY STENT INTERVENTION N/A 05/14/2018   Procedure: CORONARY STENT INTERVENTION;  Surgeon: Nelva Bush, MD;  Location: [place] [facility];  Service:  Cardiovascular;  Laterality: N/A;  . DILATION AND CURETTAGE OF UTERUS    . INGUINAL HERNIA REPAIR Right 03/26/2017   Procedure: HERNIA REPAIR INGUINAL INCARCERATED;  Surgeon: Jules Husbands, MD;  Location: [facility] ORS;  Service: General;  Laterality: Right;  . LEFT HEART CATH AND CORONARY ANGIOGRAPHY N/A 04/10/2018   Procedure: LEFT HEART CATH AND CORONARY ANGIOGRAPHY poss PCI;  Surgeon: Minna Merritts, MD;  Location: [place] [facility] LAB;  Service: Cardiovascular;  Laterality: N/A;  . PORTACATH PLACEMENT Left 10/24/2016   Procedure: INSERTION PORT-A-CATH;  Surgeon: [name], MD;  Location: [facility] ORS;  Service: General;  Laterality: Left;  . RE-EXCISION OF BREAST LUMPECTOMY Right 05/21/2017   Procedure: RE-EXCISION OF BREAST LUMPECTOMY;  Surgeon: Clayburn Pert, MD;  Location: [facility] ORS;  Service: General;  Laterality: Right;  . SENTINEL NODE BIOPSY Right 03/21/2017   Procedure: SENTINEL NODE BIOPSY;  Surgeon: Clayburn Pert, MD;  Location: [facility] ORS;  Service: General;  Laterality: Right;    Social history:  reports that she quit smoking about 31 years ago. Her smoking use included cigarettes. She smoked 0.50 packs per day. She has never used smokeless tobacco. She reports current alcohol use. She reports that she does not use drugs.   Allergies  Allergen Reactions  . Nsaids     Bleeding risk  . Penicillins Anaphylaxis, Swelling and Rash    Has patient had a PCN reaction causing immediate rash, facial/tongue/throat swelling, SOB or lightheadedness with hypotension: Yes Has patient had a PCN reaction causing severe rash involving mucus membranes or skin necrosis: No Has patient had a PCN reaction that required hospitalization: No  Has patient had a PCN reaction occurring within the last 10 years: No If all of the above answers are "NO", then may proceed with Cephalosporin use.   . Atorvastatin Other (See Comments)    Muscle Pain  . Gabapentin Swelling  . Ezetimibe Other (See  Comments)    Muscle pain  . Aspirin     GI bleeding risk    Family History  Problem Relation Age of Onset  . Colon cancer Mother   . Breast cancer Neg Hx       Prior to Admission medications   Medication Sig Start Date End Date Taking? Authorizing Provider  ADVAIR DISKUS 500-50 MCG/DOSE AEPB Inhale 1 puff into the lungs 2 (two) times daily.  05/04/16   [provider]  albuterol (PROVENTIL HFA;VENTOLIN HFA) 108 (90 Base) MCG/ACT inhaler Inhale 2 puffs into the lungs every 6 (six) hours as needed for wheezing or shortness of breath.    [provider]  aspirin EC 81 MG tablet Take 81 mg by mouth daily.    [provider]  baclofen (LIORESAL) 10 MG tablet Take 10 mg by mouth at bedtime. 01/14/17   [provider]  Calcium-Vitamin D-Vitamin K 595-638-75 MG-UNT-MCG TABS Take 1 tablet by mouth daily.    [provider]  carvedilol (COREG) 6.25 MG tablet TAKE 1 TABLET BY MOUTH TWICE A DAY Patient taking differently: Take 6.25 mg by mouth 2 (two) times daily  with a meal.  06/13/19   Gollan, Kathlene November, MD  cholecalciferol (VITAMIN D3) 25 MCG (1000 UT) tablet Take 1,000 Units by mouth daily.    [provider]  clopidogrel (PLAVIX) 75 MG tablet Take 1 tablet (75 mg total) by mouth daily with breakfast. 08/07/19   Minna Merritts, MD  Coenzyme Q10 300 MG CAPS Take 300 mg by mouth daily.     [provider]  DULoxetine (CYMBALTA) 30 MG capsule Take 30 mg by mouth every morning.  05/04/17   [provider]  enalapril (VASOTEC) 5 MG tablet TAKE 1 TABLET BY MOUTH EVERY DAY Patient taking differently: Take 5 mg by mouth daily.  06/06/19   Minna Merritts, MD  fluticasone (FLONASE) 50 MCG/ACT nasal spray Place 2 sprays into the nose daily. 05/23/19 05/22/20  [provider]  furosemide (LASIX) 20 MG tablet Take 1 tablet (20 mg total) by mouth as needed for edema. 04/22/18 09/03/19  Rise Mu, PA-C  isosorbide mononitrate  (IMDUR) 30 MG 24 hr tablet Take 0.5 tablets (15 mg total) by mouth daily. 09/03/19 03/01/20  Loel Dubonnet, NP  letrozole ([place]) 2.5 MG tablet TAKE 1 TABLET BY MOUTH EVERY DAY Patient taking differently: Take 2.5 mg by mouth daily.  06/03/19   Verlon Au, NP  lidocaine-prilocaine (EMLA) cream Apply 1 application as needed topically (for port access).    [provider]  magnesium oxide (MAG-OX) 400 MG tablet Take 400 mg by mouth daily.    [provider]  Melatonin 5 MG TABS Take 10 mg by mouth at bedtime as needed (sleep).    [provider]  Melatonin-Pyridoxine 5-1 MG TABS Take 1 tablet by mouth daily.    [provider]  montelukast (SINGULAIR) 10 MG tablet Take 10 mg by mouth at bedtime.  05/23/16   [provider]  nitroGLYCERIN (NITROSTAT) 0.4 MG SL tablet Place 1 tablet (0.4 mg total) under the tongue every 5 (five) minutes as needed for chest pain. 09/03/19 12/02/19  Loel Dubonnet, NP  pantoprazole (PROTONIX) 40 MG tablet Take 1 tablet (40 mg total) by mouth daily. 05/15/19   Minna Merritts, MD  potassium chloride SA (KLOR-CON M20) 20 MEQ tablet Take 1 tablet (20 mEq total) by mouth daily. 05/26/19   Lloyd Huger, MD  rosuvastatin (CRESTOR) 20 MG tablet Take 1 tablet (20 mg total) by mouth daily at 6 PM. 08/04/19   Gollan, Kathlene November, MD  spironolactone (ALDACTONE) 25 MG tablet Take 0.5 tablets (12.5 mg total) by mouth daily. 07/10/19 10/08/19  Minna Merritts, MD  prochlorperazine (COMPAZINE) 10 MG tablet Take 1 tablet (10 mg total) by mouth every 6 (six) hours as needed (Nausea or vomiting). 10/20/16 07/31/17  Lloyd Huger, MD    Physical Exam: Vitals:   10/07/19 1735 10/07/19 1830 10/07/19 1900 10/07/19 1942  BP: (!) 112/45 (!) 125/45 (!) 107/47 (!) 121/53  Pulse:  82 78 77  Resp: (!) 22 20 18  (!) 21  Temp:      TempSrc:      SpO2: 100% 96% 98% 96%  Weight:      Height:        Constitutional: NAD, calm,  comfortable Eyes: PERRL, lids and conjunctivae normal ENMT: Mucous membranes are moist. Posterior pharynx clear of any exudate or lesions.Normal dentition.  Neck: normal, supple, no masses, no thyromegaly Respiratory: clear to auscultation bilaterally, no wheezing, no crackles. Normal respiratory effort. No accessory muscle use.  Cardiovascular: Regular rate and rhythm, no murmurs / rubs / gallops. No extremity edema. 2+ pedal pulses. No carotid bruits.  Abdomen: Suprapubic greater than bilateral lower quadrant tenderness, no rebound or guarding.  No hepatosplenomegaly. Bowel sounds positive.  Musculoskeletal: no clubbing / cyanosis. No joint deformity upper and lower extremities. Good ROM, no contractures. Normal muscle tone.  Neurologic: CN 2-12 grossly intact. Sensation intact, DTR normal. Strength 5/5 in all 4.  Psychiatric: Normal judgment and insight. Alert and oriented x 3. Normal mood.  SKIN/catheters: no rashes, lesions, ulcers. No induration  Labs on Admission: I have personally reviewed following labs and imaging studies  CBC: Recent Labs  Lab 10/07/19 1146  WBC 24.2*  HGB 14.5  HCT 46.4*  MCV 83.6  PLT 786   Basic Metabolic Panel: Recent Labs  Lab 10/07/19 1146 10/07/19 1435  NA 135  --   K 5.5*  --   CL 99  --   CO2 23  --   GLUCOSE 178*  --   BUN 50*  --   CREATININE 1.97*  --   CALCIUM 8.9  --   MG  --  3.1*   GFR: Estimated Creatinine Clearance: 20.6 mL/min (A) (by C-G formula based on SCr of 1.97 mg/dL (H)). Recent Labs  Lab 10/07/19 1146 10/07/19 1436 10/07/19 1656  WBC 24.2*  --   --   LATICACIDVEN  --  2.1* 0.9   Liver Function Tests: Recent Labs  Lab 10/07/19 1146  AST 30  ALT 16  ALKPHOS 59  BILITOT 0.8  PROT 7.6  ALBUMIN 3.3*   Recent Labs  Lab 10/07/19 1146  LIPASE 19   No results for input(s): AMMONIA in the last 168 hours. Coagulation Profile: No results for input(s): INR, PROTIME in the last 168 hours. Cardiac Enzymes: No  results for input(s): CKTOTAL, CKMB, CKMBINDEX, TROPONINI in the last 168 hours. BNP (last 3 results) No results for input(s): PROBNP in the last 8760 hours. HbA1C: No results for input(s): HGBA1C in the last 72 hours. CBG: No results for input(s): GLUCAP in the last 168 hours. Lipid Profile: No results for input(s): CHOL, HDL, LDLCALC, TRIG, CHOLHDL, LDLDIRECT in the last 72 hours. Thyroid Function Tests: No results for input(s): TSH, T4TOTAL, FREET4, T3FREE, THYROIDAB in the last 72 hours. Anemia Panel: No results for input(s): VITAMINB12, FOLATE, FERRITIN, TIBC, IRON, RETICCTPCT in the last 72 hours. Urine analysis:    Component Value Date/Time   COLORURINE YELLOW (A) 03/12/2018 1359   APPEARANCEUR CLEAR (A) 03/12/2018 1359   LABSPEC 1.020 03/12/2018 1359   PHURINE 5.0 03/12/2018 1359   GLUCOSEU NEGATIVE 03/12/2018 1359   HGBUR NEGATIVE 03/12/2018 1359   BILIRUBINUR NEGATIVE 03/12/2018 1359   KETONESUR NEGATIVE 03/12/2018 1359   PROTEINUR NEGATIVE 03/12/2018 1359   NITRITE NEGATIVE 03/12/2018 1359   LEUKOCYTESUR NEGATIVE 03/12/2018 1359    Radiological Exams on Admission: Personally reviewed  CT ABDOMEN PELVIS WO CONTRAST  Result Date: 10/07/2019 CLINICAL DATA:  Abdominal distension, nausea and vomiting. EXAM: CT ABDOMEN AND PELVIS WITHOUT CONTRAST TECHNIQUE: Multidetector CT imaging of the abdomen and pelvis was performed following the standard protocol without IV contrast. COMPARISON:  CT abdomen and pelvis 03/26/2017. FINDINGS: Lower chest: Extensive emphysematous disease is present in the lung bases. No pleural or pericardial effusion. Heart size is normal. Calcific coronary artery disease noted. Hepatobiliary: No focal liver abnormality is seen. No gallstones, gallbladder wall thickening, or biliary dilatation. Pancreas: Unremarkable. No pancreatic ductal dilatation or surrounding inflammatory changes. Spleen: Normal in  size without focal abnormality. Adrenals/Urinary Tract:  The adrenal glands appear normal. There is no hydronephrosis on the right or left. Nonobstructing 0.3 cm stone in the left kidney is noted. The kidneys otherwise appear normal. Urinary bladder is distended. Stomach/Bowel: Stomach is within normal limits. Appendix appears normal. No evidence of bowel wall thickening, distention, or inflammatory changes. Large volume of stool in the rectosigmoid colon noted. Vascular/Lymphatic: No significant vascular findings are present. No enlarged abdominal or pelvic lymph nodes. Reproductive: Status post hysterectomy. No adnexal masses. Other: None. Musculoskeletal: No acute abnormality. Convex left lumbar scoliosis and degenerative disease at L4-5 are seen. IMPRESSION: No acute abnormality abdomen or pelvis. Distended urinary bladder noted. Large volume of stool in the rectosigmoid colon. Aortic Atherosclerosis (ICD10-I70.0) and Emphysema (ICD10-J43.9). Small nonobstructing stone left kidney. Electronically Signed   By: Inge Rise M.D.   On: 10/07/2019 15:18   DG Chest Portable 1 View  Result Date: 10/07/2019 CLINICAL DATA:  Weakness EXAM: PORTABLE CHEST 1 VIEW COMPARISON:  05/28/2019 FINDINGS: Cardiac shadow is within normal limits. Stable aortic calcifications are seen. Lungs are well aerated bilaterally. Diffuse emphysematous changes are seen with crowding of markings in the bases bilaterally. Left chest port is again seen and stable. No bony abnormality is noted. IMPRESSION: Changes of COPD without acute abnormality. Aortic Atherosclerosis (ICD10-I70.0) and Emphysema (ICD10-J43.9). Electronically Signed   By: Inez Catalina M.D.   On: 10/07/2019 14:37    EKG: Independently reviewed.anterolateral T wave inversions that are new since 09/03/2019.       Assessment and Plan:   Principal Problem:   SIRS (systemic inflammatory response syndrome) (HCC) Active Problems:   NSTEMI (non-ST elevated myocardial infarction) (HCC)   Constipation   Essential  hypertension   GERD (gastroesophageal reflux disease)   Malignant neoplasm of right female breast (HCC)   Hyperlipidemia, mixed   Coronary artery disease involving native coronary artery of native heart with angina pectoris ([place])   Chronic systolic heart failure ([place])   Urinary retention    1.  SIRS: Patient had hypotension, tachypnea, lactic acidosis on presentation with labs showing significant leukocytosis.  However there is no source of infection and she is afebrile.Marland Kitchen  UA is pending.  Leukocytosis could be reactive and hypotension/lactic acidosis could be in the setting of dehydration and AKI.  Agree with broad-spectrum antibiotics for now until UTI ruled out as she does have suprapubic tenderness on exam.  Hold home diuretics (Lasix as needed and Aldactone daily), continue IV fluids and repeat labs in AM.  2. Abdominal pain: Secondary to stool burden/urinary retention.  Patient however was able to void in the ED before straight cath.  Continue to monitor PVRs.  Follow-up urine culture report.  No other significant findings on CT abdomen.  Will give enema and laxatives.  3.  CAD/atypical chest pain: Concern for non-STEMI given abnormal EKG and elevated troponins.  Evaluated by cardiology and started on heparin drip although there seem to be leaning to with demand ischemia in the setting of initial hypotension and possible infection.  Last cardiac cath in 04/2018 did show patent stents and 90% obstructive disease in mid LCx-medical management was recommended at that time.  Resume antiplatelet agents/statins/low-dose Coreg as tolerated by blood pressure.  Hold Imdur in concern for hypotension, nitro as needed available.   4.  Chronic systolic cardiomyopathy: Patient previously had EF of 35% but improved to 55% on last echo.  No signs of volume overload.  Appears dry. Hold ACE inhibitors/diuretics in concern for AKI.  5. AKI on chronic kidney disease stage IIIa: Likely secondary to dehydration.   Rule out urinary retention.  Patient's baseline creatinine appears to be around 1.2 although in February 2021, patient did have isolated labs showing creatinine elevation upto 1.5.  Continue IV fluids and hold diuretics/ACE inhibitors.  Patient has mild hypokalemia which should improve with hydration.  6.  History of breast cancer: S/p chemoradiation.  Resume home regimen  7.  GERD: PPI   8. depression/anxiety: Resume home medications  9.  COPD: Stable.  Resume home medications  DVT prophylaxis: On heparin drip  COVID screen: Pending  Code Status: Full code.  Patient/Family Communication: Discussed with patient and all questions answered to satisfaction.  Consults called: Cardiology Admission status :I certify that at the point of admission it is my clinical judgment that the patient will require inpatient hospital care spanning beyond 2 midnights from the point of admission due to high intensity of service and high frequency of surveillance required.Inpatient status is judged to be reasonable and necessary in order to provide the required intensity of service to ensure the patient's safety. The patient's presenting symptoms, physical exam findings, and initial radiographic and laboratory data in the context of their chronic comorbidities is felt to place them at high risk for further clinical deterioration. The following factors support the patient status of inpatient : IV treatments including IV fluids IV antibiotics, IV heparin and sepsis work-up.     Guilford Shi MD [facility] Pager in [place]  If 7PM-7AM, please contact night-coverage [email or web address]   10/07/2019, 8:31 PM

## 2019-10-07 NOTE — Progress Notes (Signed)
PHARMACY -  BRIEF ANTIBIOTIC NOTE   Pharmacy has received consult(s) for vancomycin and aztreonam from an ED provider.  The patient's profile has been reviewed for ht/wt/allergies/indication/available labs.    One time order(s) placed for vanc 1 g + aztreonam 2 g  Further antibiotics/pharmacy consults should be ordered by admitting physician if indicated.                       Thank you,  Tawnya Crook, PharmD 10/07/2019  2:18 PM

## 2019-10-07 NOTE — ED Notes (Signed)
Patient reports significantly increased weakness x 2 days.  Patient reports she was previously constipated and began taking laxatives on Saturday including dulcolax.  Patient began having diarrhea yesterday and it has continued through today.

## 2019-10-07 NOTE — ED Notes (Signed)
Medications not given at this time due to no pharmacy verification. Will monitor and give once verified.

## 2019-10-07 NOTE — ED Notes (Signed)
Attempted to straight cath patient x2 with no success. Mimi, tech at bedside with this RN on attempt.

## 2019-10-07 NOTE — ED Notes (Signed)
This RN made 2 attempts to draw blood cultures without success.  MD Notified and lab asked to come draw blood cultures.

## 2019-10-07 NOTE — ED Notes (Signed)
Pt changed by this RN and Caryl Pina, RN at this time. Patient brief clean/dry, patient had large bowel movement. Pt comfortable in bed at this time, will continue to monitor.

## 2019-10-07 NOTE — ED Notes (Addendum)
Pharmacy hasn't sent gabuers yet so this RN confirmed with Dr. Juliet Rude to start abx now and get second set of cultures as soon as possible.

## 2019-10-07 NOTE — Progress Notes (Signed)
CODE SEPSIS - PHARMACY COMMUNICATION  **Broad Spectrum Antibiotics should be administered within 1 hour of Sepsis diagnosis**  Time Code Sepsis Called/Page Received: 1410  Antibiotics Ordered: aztreonam/metronidazole/vancomycin  Time of 1st antibiotic administration: 1602  Additional action taken by pharmacy: NA - delay while attempting to get second set of blood cultures, give abx per MD with one set blood cultures  If necessary, Name of Provider/Nurse Contacted: NA    Tawnya Crook ,PharmD Clinical Pharmacist  10/07/2019  4:13 PM

## 2019-10-07 NOTE — ED Provider Notes (Signed)
[facility] Emergency Department Provider Note  ____________________________________________   First MD Initiated Contact with Patient 10/07/19 1324     (approximate)  I have reviewed the triage vital signs and the nursing notes.   HISTORY  Chief Complaint Weakness    HPI Krista Cisneros is a 79 y.o. female with past medical history of breast cancer, CHF, hypertension, hyperlipidemia, here with generalized weakness.  The patient symptom started as mild abdominal discomfort and constipation several days ago.  She has been taking stool softeners without significant relief into the last 24 hours, when she is began have diarrhea.  She said associated progressively worsening generalized weakness, loss of appetite, and fatigue.  She is having difficulty just getting around the house today due to this fatigue.  She had a mild increase in cough as well.  She has had very poor appetite.  She has had some mild confusion.  She has had some intermittent, random, diffuse abdominal pain with no focal tenderness.  No specific alleviating or aggravating factors.  No particular recent sick contacts.  No recent changes in medication.  She is had some lightheadedness with standing and moving today.        Past Medical History:  Diagnosis Date  . Anxiety   . Arthritis   . CAD (coronary artery disease)    a. NSTEMI 12/19; b. LHC 04/10/18: pLAD 95%, mLAD 80%, m-dLCx 95%, OM3-1 lesion 40%, OM3-2 lesion 60%, mRCA 50%, EF 35-45%, successful PCI/DES x 2 to the LAD with recommended staged PCI of the LCx in a few weeks  . [facility])    Right Breast Cancer  . Chronic systolic CHF (congestive heart failure) ([place])    a. TTE 12/19: EF 30-35%, anteroseptal, anterior, and apical HK, Gr1DD, mild to mod MR, mildly dilated LA, RVSF nl  . COPD (chronic obstructive pulmonary disease) ([place])   . Depression   . Dyspnea    with exertion  . GERD (gastroesophageal reflux disease)   .  Hyperlipidemia   . Hypertension   . Personal history of chemotherapy 2018   chemo prior to lumpectomy of right breast  . Personal history of radiation therapy 2019   right breast ca    Patient Active Problem List   Diagnosis Date Noted  . Iron deficiency anemia 05/30/2019  . Ischemic cardiomyopathy 06/03/2018  . Coronary artery disease involving native coronary artery of native heart with angina pectoris ([place]) 05/14/2018  . Chronic systolic heart failure ([place])   . Coronary artery disease involving native coronary artery of native heart without angina pectoris 04/22/2018  . Non-STEMI (non-ST elevated myocardial infarction) ([place]) 04/22/2018  . NSTEMI (non-ST elevated myocardial infarction) ([place]) 04/09/2018  . Malignant neoplasm of upper-outer quadrant of right breast in female, estrogen receptor negative ([place]) 06/19/2017  . Incarcerated hernia 03/26/2017  . Incarcerated inguinal hernia   . Acute kidney injury ([place]) 02/07/2017  . Hypomagnesemia 01/30/2017  . Goals of care, counseling/discussion 10/20/2016  . Malignant neoplasm of right female breast ([place]) 10/16/2016  . Hyperlipidemia, mixed 10/08/2016  . Chronic venous insufficiency 09/14/2016  . GERD (gastroesophageal reflux disease) 09/14/2016  . Pain in limb 05/29/2016  . Essential hypertension 05/29/2016  . COPD (chronic obstructive pulmonary disease) ([place]) 05/29/2016  . Hardening of the aorta (main artery of the heart) ([place]) 01/07/2016  . Hyperglycemia, unspecified 01/07/2016  . Neuropathy 11/15/2015  . Severe recurrent major depression without psychotic features ([place]) 09/10/2015  . Allergic rhinitis 04/10/2014  . Depression 04/10/2014  .  Obesity, unspecified 04/10/2014    Past Surgical History:  Procedure Laterality Date  . ABDOMINAL HYSTERECTOMY  1990   Partial  . BREAST BIOPSY Right 10/04/2016   axilla lymph node (metastatic carcinoma) and axillay tail mass biopsy-invasive mammary carcinoma  . BREAST LUMPECTOMY Right  03/21/2017   chemo first, [facility] and metastatic LN  . BREAST LUMPECTOMY Right 05/21/2017   re-excision for clip  . BREAST LUMPECTOMY WITH NEEDLE LOCALIZATION Right 03/21/2017   Procedure: BREAST LUMPECTOMY WITH NEEDLE LOCALIZATION;  Surgeon: Clayburn Pert, MD;  Location: [facility] ORS;  Service: General;  Laterality: Right;  . CORONARY STENT INTERVENTION N/A 04/10/2018   Procedure: CORONARY STENT INTERVENTION;  Surgeon: Wellington Hampshire, MD;  Location: [place] CV LAB;  Service: Cardiovascular;  Laterality: N/A;  . CORONARY STENT INTERVENTION N/A 05/14/2018   Procedure: CORONARY STENT INTERVENTION;  Surgeon: Nelva Bush, MD;  Location: [place] CV LAB;  Service: Cardiovascular;  Laterality: N/A;  . DILATION AND CURETTAGE OF UTERUS    . INGUINAL HERNIA REPAIR Right 03/26/2017   Procedure: HERNIA REPAIR INGUINAL INCARCERATED;  Surgeon: Jules Husbands, MD;  Location: [facility] ORS;  Service: General;  Laterality: Right;  . LEFT HEART CATH AND CORONARY ANGIOGRAPHY N/A 04/10/2018   Procedure: LEFT HEART CATH AND CORONARY ANGIOGRAPHY poss PCI;  Surgeon: Minna Merritts, MD;  Location: [place] CV LAB;  Service: Cardiovascular;  Laterality: N/A;  . PORTACATH PLACEMENT Left 10/24/2016   Procedure: INSERTION PORT-A-CATH;  Surgeon: [name], MD;  Location: [facility] ORS;  Service: General;  Laterality: Left;  . RE-EXCISION OF BREAST LUMPECTOMY Right 05/21/2017   Procedure: RE-EXCISION OF BREAST LUMPECTOMY;  Surgeon: Clayburn Pert, MD;  Location: [facility] ORS;  Service: General;  Laterality: Right;  . SENTINEL NODE BIOPSY Right 03/21/2017   Procedure: SENTINEL NODE BIOPSY;  Surgeon: Clayburn Pert, MD;  Location: [facility] ORS;  Service: General;  Laterality: Right;    Prior to Admission medications   Medication Sig Start Date End Date Taking? Authorizing Provider  ADVAIR DISKUS 500-50 MCG/DOSE AEPB Inhale 1 puff into the lungs 2 (two) times daily.  05/04/16   [provider]  albuterol  (PROVENTIL HFA;VENTOLIN HFA) 108 (90 Base) MCG/ACT inhaler Inhale 2 puffs into the lungs every 6 (six) hours as needed for wheezing or shortness of breath.    [provider]  aspirin EC 81 MG tablet Take 81 mg by mouth daily.    [provider]  baclofen (LIORESAL) 10 MG tablet Take 10 mg by mouth at bedtime. 01/14/17   [provider]  Calcium-Vitamin D-Vitamin K 076-226-33 MG-UNT-MCG TABS Take 1 tablet by mouth daily.    [provider]  carvedilol (COREG) 6.25 MG tablet TAKE 1 TABLET BY MOUTH TWICE A DAY 06/13/19   Minna Merritts, MD  cholecalciferol (VITAMIN D3) 25 MCG (1000 UT) tablet Take 1,000 Units by mouth daily.    [provider]  clopidogrel (PLAVIX) 75 MG tablet Take 1 tablet (75 mg total) by mouth daily with breakfast. 08/07/19   Minna Merritts, MD  Coenzyme Q10 300 MG CAPS Take 300 mg by mouth daily.     [provider]  DULoxetine (CYMBALTA) 30 MG capsule Take 30 mg by mouth every morning.  05/04/17   [provider]  DULoxetine (CYMBALTA) 60 MG capsule Take 60 mg by mouth daily.  03/04/17   [provider]  enalapril (VASOTEC) 5 MG tablet TAKE 1 TABLET BY MOUTH EVERY DAY 06/06/19   Minna Merritts, MD  fluticasone (FLONASE) 50 MCG/ACT nasal spray Place 2 sprays into the nose daily. 05/23/19 05/22/20  [provider]  furosemide (LASIX) 20 MG tablet Take 1 tablet (20 mg total) by mouth as needed for edema. 04/22/18 09/03/19  Rise Mu, PA-C  isosorbide mononitrate (IMDUR) 30 MG 24 hr tablet Take 0.5 tablets (15 mg total) by mouth daily. 09/03/19 03/01/20  Loel Dubonnet, NP  letrozole [facility]) 2.5 MG tablet TAKE 1 TABLET BY MOUTH EVERY DAY 06/03/19   Verlon Au, NP  lidocaine-prilocaine (EMLA) cream Apply 1 application as needed topically (for port access).    [provider]  magnesium oxide (MAG-OX) 400 MG tablet Take 400 mg by mouth daily.    [provider]  Melatonin 5 MG  TABS Take 10 mg by mouth at bedtime as needed (sleep).    [provider]  Melatonin-Pyridoxine 5-1 MG TABS Take 1 tablet by mouth daily.    [provider]  montelukast (SINGULAIR) 10 MG tablet Take 10 mg by mouth at bedtime.  05/23/16   [provider]  nitroGLYCERIN (NITROSTAT) 0.4 MG SL tablet Place 1 tablet (0.4 mg total) under the tongue every 5 (five) minutes as needed for chest pain. 09/03/19 12/02/19  Loel Dubonnet, NP  pantoprazole (PROTONIX) 40 MG tablet Take 1 tablet (40 mg total) by mouth daily. 05/15/19   Minna Merritts, MD  potassium chloride SA (KLOR-CON M20) 20 MEQ tablet Take 1 tablet (20 mEq total) by mouth daily. 05/26/19   Lloyd Huger, MD  rosuvastatin (CRESTOR) 20 MG tablet Take 1 tablet (20 mg total) by mouth daily at 6 PM. 08/04/19   Gollan, Kathlene November, MD  spironolactone (ALDACTONE) 25 MG tablet Take 0.5 tablets (12.5 mg total) by mouth daily. 07/10/19 10/08/19  Minna Merritts, MD  prochlorperazine (COMPAZINE) 10 MG tablet Take 1 tablet (10 mg total) by mouth every 6 (six) hours as needed (Nausea or vomiting). 10/20/16 07/31/17  Lloyd Huger, MD    Allergies Nsaids, Penicillins, Atorvastatin, Gabapentin, Ezetimibe, and Aspirin  Family History  Problem Relation Age of Onset  . Colon cancer Mother   . Breast cancer Neg Hx     Social History Social History   Tobacco Use  . Smoking status: Former Smoker    Packs/day: 0.50    Types: Cigarettes    Quit date: 07/23/1988    Years since quitting: 31.2  . Smokeless tobacco: Never Used  Vaping Use  . Vaping Use: Never used  Substance Use Topics  . Alcohol use: Yes    Comment: rare  . Drug use: No    Review of Systems  Review of Systems  Constitutional: Positive for appetite change and fatigue. Negative for fever.  HENT: Negative for congestion and sore throat.   Eyes: Negative for visual disturbance.  Respiratory: Positive for cough. Negative for shortness of breath.    Cardiovascular: Negative for chest pain.  Gastrointestinal: Positive for abdominal pain and nausea. Negative for diarrhea and vomiting.  Genitourinary: Negative for flank pain.  Musculoskeletal: Negative for back pain and neck pain.  Skin: Negative for rash and wound.  Neurological: Positive for weakness and light-headedness.  All other systems reviewed and are negative.    ____________________________________________  PHYSICAL EXAM:      VITAL SIGNS: ED Triage Vitals [10/07/19 1132]  Enc Vitals Group     BP (!) 118/45     Pulse Rate 63     Resp (!) 22  Temp 98.3 F (36.8 C)     Temp Source Axillary     SpO2 96 %     Weight 140 lb (63.5 kg)     Height 5\' 2"  (1.575 m)     Head Circumference      Peak Flow      Pain Score 5     Pain Loc      Pain Edu?      Excl. in [place]?      Physical Exam Vitals and nursing note reviewed.  Constitutional:      General: She is not in acute distress.    Appearance: She is well-developed. She is ill-appearing.  HENT:     Head: Normocephalic and atraumatic.     Mouth/Throat:     Mouth: Mucous membranes are dry.  Eyes:     Conjunctiva/sclera: Conjunctivae normal.  Cardiovascular:     Rate and Rhythm: Normal rate and regular rhythm.     Heart sounds: Normal heart sounds. No murmur heard.  No friction rub.  Pulmonary:     Effort: Pulmonary effort is normal. No respiratory distress.     Breath sounds: Normal breath sounds. No wheezing or rales.  Abdominal:     General: Abdomen is flat. There is no distension.     Palpations: Abdomen is soft.     Tenderness: There is abdominal tenderness (mild, generalized).  Musculoskeletal:     Cervical back: Neck supple.  Skin:    General: Skin is warm.     Capillary Refill: Capillary refill takes less than 2 seconds.  Neurological:     Mental Status: She is alert and oriented to person, place, and time.     Motor: No abnormal muscle tone.        ____________________________________________   LABS (all labs ordered are listed, but only abnormal results are displayed)  Labs Reviewed  BASIC METABOLIC PANEL - Abnormal; Notable for the following components:      Result Value   Potassium 5.5 (*)    Glucose, Bld 178 (*)    BUN 50 (*)    Creatinine, Ser 1.97 (*)    GFR calc non Af Amer 24 (*)    GFR calc Af Amer 28 (*)    All other components within normal limits  CBC - Abnormal; Notable for the following components:   WBC 24.2 (*)    RBC 5.55 (*)    HCT 46.4 (*)    RDW 25.2 (*)    All other components within normal limits  HEPATIC FUNCTION PANEL - Abnormal; Notable for the following components:   Albumin 3.3 (*)    All other components within normal limits  LACTIC ACID, PLASMA - Abnormal; Notable for the following components:   Lactic Acid, Venous 2.1 (*)    All other components within normal limits  MAGNESIUM - Abnormal; Notable for the following components:   Magnesium 3.1 (*)    All other components within normal limits  TROPONIN I (HIGH SENSITIVITY) - Abnormal; Notable for the following components:   Troponin I (High Sensitivity) 562 (*)    All other components within normal limits  TROPONIN I (HIGH SENSITIVITY) - Abnormal; Notable for the following components:   Troponin I (High Sensitivity) 484 (*)    All other components within normal limits  CULTURE, BLOOD (ROUTINE X 2)  CULTURE, BLOOD (ROUTINE X 2)  URINE CULTURE  SARS CORONAVIRUS 2 BY RT PCR (HOSPITAL ORDER, [place] [facility])  LIPASE, BLOOD  URINALYSIS, COMPLETE (UACMP) WITH MICROSCOPIC  LACTIC ACID, PLASMA  URINALYSIS, ROUTINE W REFLEX MICROSCOPIC  CBG MONITORING, ED    ____________________________________________  EKG: Normal sinus rhythm, ventricular rate 81.  PR 156, QRS 76, QTc 460.  T wave inversions noted in the inferolateral as well as lateral leads, with no overt ST elevations.  When compared to prior, there are diffuse  changes consistent with ischemia but without evidence of acute STEMI. ________________________________________  RADIOLOGY All imaging, including plain films, CT scans, and ultrasounds, independently reviewed by me, and interpretations confirmed via formal radiology reads.  ED MD interpretation:   CXR: COPD, no acute abnormality  Official radiology report(s): CT ABDOMEN PELVIS WO CONTRAST  Result Date: 10/07/2019 CLINICAL DATA:  Abdominal distension, nausea and vomiting. EXAM: CT ABDOMEN AND PELVIS WITHOUT CONTRAST TECHNIQUE: Multidetector CT imaging of the abdomen and pelvis was performed following the standard protocol without IV contrast. COMPARISON:  CT abdomen and pelvis 03/26/2017. FINDINGS: Lower chest: Extensive emphysematous disease is present in the lung bases. No pleural or pericardial effusion. Heart size is normal. Calcific coronary artery disease noted. Hepatobiliary: No focal liver abnormality is seen. No gallstones, gallbladder wall thickening, or biliary dilatation. Pancreas: Unremarkable. No pancreatic ductal dilatation or surrounding inflammatory changes. Spleen: Normal in size without focal abnormality. Adrenals/Urinary Tract: The adrenal glands appear normal. There is no hydronephrosis on the right or left. Nonobstructing 0.3 cm stone in the left kidney is noted. The kidneys otherwise appear normal. Urinary bladder is distended. Stomach/Bowel: Stomach is within normal limits. Appendix appears normal. No evidence of bowel wall thickening, distention, or inflammatory changes. Large volume of stool in the rectosigmoid colon noted. Vascular/Lymphatic: No significant vascular findings are present. No enlarged abdominal or pelvic lymph nodes. Reproductive: Status post hysterectomy. No adnexal masses. Other: None. Musculoskeletal: No acute abnormality. Convex left lumbar scoliosis and degenerative disease at L4-5 are seen. IMPRESSION: No acute abnormality abdomen or pelvis. Distended urinary  bladder noted. Large volume of stool in the rectosigmoid colon. Aortic Atherosclerosis (ICD10-I70.0) and Emphysema (ICD10-J43.9). Small nonobstructing stone left kidney. Electronically Signed   By: Inge Rise M.D.   On: 10/07/2019 15:18   DG Chest Portable 1 View  Result Date: 10/07/2019 CLINICAL DATA:  Weakness EXAM: PORTABLE CHEST 1 VIEW COMPARISON:  05/28/2019 FINDINGS: Cardiac shadow is within normal limits. Stable aortic calcifications are seen. Lungs are well aerated bilaterally. Diffuse emphysematous changes are seen with crowding of markings in the bases bilaterally. Left chest port is again seen and stable. No bony abnormality is noted. IMPRESSION: Changes of COPD without acute abnormality. Aortic Atherosclerosis (ICD10-I70.0) and Emphysema (ICD10-J43.9). Electronically Signed   By: Inez Catalina M.D.   On: 10/07/2019 14:37    ____________________________________________  PROCEDURES   Procedure(s) performed (including Critical Care):  .1-3 Lead EKG Interpretation Performed by: Duffy Bruce, MD Authorized by: Duffy Bruce, MD     Interpretation: normal     ECG rate:  70-80   ECG rate assessment: normal     Rhythm: sinus rhythm     Ectopy: none     Conduction: normal   Comments:     Indication: Weakness, sepsis .Critical Care Performed by: Duffy Bruce, MD Authorized by: Duffy Bruce, MD   Critical care provider statement:    Critical care time (minutes):  35   Critical care time was exclusive of:  Separately billable procedures and treating other patients and teaching time   Critical care was necessary to treat or prevent imminent or life-threatening deterioration of the following conditions:  Cardiac failure, circulatory failure and sepsis   Critical care was time spent personally by me on the following activities:  Development of treatment plan with patient or surrogate, discussions with consultants, evaluation of patient's response to treatment,  examination of patient, obtaining history from patient or surrogate, ordering and performing treatments and interventions, ordering and review of laboratory studies, ordering and review of radiographic studies, pulse oximetry, re-evaluation of patient's condition and review of old charts   I assumed direction of critical care for this patient from another provider in my specialty: no      ____________________________________________  INITIAL IMPRESSION / MDM / [place] / ED COURSE  As part of my medical decision making, I reviewed the following data within the [place] notes reviewed and incorporated, Old chart reviewed, Notes from prior ED visits, and  [place] Controlled Substance Database       *Nayeliz Hipp was evaluated in Emergency Department on 10/07/2019 for the symptoms described in the history of present illness. She was evaluated in the context of the global COVID-19 pandemic, which necessitated consideration that the patient might be at risk for infection with the SARS-CoV-2 virus that causes COVID-19. Institutional protocols and algorithms that pertain to the evaluation of patients at risk for COVID-19 are in a state of rapid change based on information released by regulatory bodies including the CDC and federal and state organizations. These policies and algorithms were followed during the patient's care in the ED.  Some ED evaluations and interventions may be delayed as a result of limited staffing during the pandemic.*  Clinical Course as of Oct 06 1549  Tue Oct 07, 2718  3352 79 year old female here with abdominal pain, weakness, nausea, and poor p.o. intake. Exam, labs reviewed as above. Patient has significant leukocytosis, mild lactic acidosis, and significant likely prerenal acute kidney injury with elevated BUN and creatinine ratio and hypomagnesemia. LFTs are normal. Troponin is elevated as well. Primary suspicion is acute on chronic  constipation leading to mild urinary retention with subsequent UTI and early sepsis. CT scan shows large stool burden and she has a history of similar presentations, as well as distended bladder. No surgical abnormality. Regarding her elevated troponin, EKG is nonischemic and she is adamant she has no chest pain or shortness of breath. I suspect this is demand ischemia in the setting of her infection. Will continue to monitor. In the setting of her AKI, will hold on aspirin.   [CI]    Clinical Course User Index [CI] Duffy Bruce, MD    Medical Decision Making:  As above. Broad spectrum ABX started. Admit to medicine.  ____________________________________________  FINAL CLINICAL IMPRESSION(S) / ED DIAGNOSES  Final diagnoses:  Weakness  Dehydration  AKI (acute kidney injury) ([place])  Slow transit constipation  Sepsis without acute organ dysfunction, due to unspecified organism [facility])     MEDICATIONS GIVEN DURING THIS VISIT:  Medications  sodium chloride flush (NS) 0.9 % injection 3 mL (3 mLs Intravenous Not Given 10/07/19 1459)  sodium chloride 0.9 % bolus 1,000 mL (has no administration in time range)  aztreonam (AZACTAM) 2 g in sodium chloride 0.9 % 100 mL IVPB (has no administration in time range)  metroNIDAZOLE (FLAGYL) IVPB 500 mg (has no administration in time range)  vancomycin (VANCOCIN) IVPB 1000 mg/200 mL premix (has no administration in time range)  pentafluoroprop-tetrafluoroeth (GEBAUERS) aerosol (has no administration in time range)  sodium chloride 0.9 % bolus 1,000 mL (1,000 mLs Intravenous New Bag/Given  10/07/19 1417)     ED Discharge Orders    None       Note:  This document was prepared using Dragon voice recognition software and may include unintentional dictation errors.   Duffy Bruce, MD 10/07/19 [phone]

## 2019-10-07 NOTE — ED Notes (Signed)
Pt difficult stick and lab unable to obtain second set of blood cultures. This RN asked patient if its okay to access port, pt confirms but requests numbing first. [place] with Dr. Ellender Hose.

## 2019-10-08 ENCOUNTER — Other Ambulatory Visit: Payer: Self-pay

## 2019-10-08 ENCOUNTER — Inpatient Hospital Stay: Payer: Medicare Other

## 2019-10-08 DIAGNOSIS — R339 Retention of urine, unspecified: Secondary | ICD-10-CM

## 2019-10-08 DIAGNOSIS — N390 Urinary tract infection, site not specified: Secondary | ICD-10-CM

## 2019-10-08 DIAGNOSIS — I214 Non-ST elevation (NSTEMI) myocardial infarction: Secondary | ICD-10-CM

## 2019-10-08 LAB — BASIC METABOLIC PANEL
Anion gap: 6 (ref 5–15)
BUN: 41 mg/dL — ABNORMAL HIGH (ref 8–23)
CO2: 22 mmol/L (ref 22–32)
Calcium: 7.5 mg/dL — ABNORMAL LOW (ref 8.9–10.3)
Chloride: 108 mmol/L (ref 98–111)
Creatinine, Ser: 1.41 mg/dL — ABNORMAL HIGH (ref 0.44–1.00)
GFR calc Af Amer: 41 mL/min — ABNORMAL LOW (ref >60)
GFR calc non Af Amer: 36 mL/min — ABNORMAL LOW (ref >60)
Glucose, Bld: 107 mg/dL — ABNORMAL HIGH (ref 70–99)
Potassium: 4.3 mmol/L (ref 3.5–5.1)
Sodium: 136 mmol/L (ref 135–145)

## 2019-10-08 LAB — URINALYSIS, COMPLETE (UACMP) WITH MICROSCOPIC
Bilirubin Urine: NEGATIVE
Glucose, UA: NEGATIVE mg/dL
Ketones, ur: NEGATIVE mg/dL
Nitrite: POSITIVE — AB
Protein, ur: NEGATIVE mg/dL
Specific Gravity, Urine: 1.019 (ref 1.005–1.030)
Squamous Epithelial / HPF: NONE SEEN (ref 0–5)
WBC, UA: >50 WBC/hpf — ABNORMAL HIGH (ref 0–5)
pH: 5 (ref 5.0–8.0)

## 2019-10-08 LAB — CBC
HCT: 38.2 % (ref 36.0–46.0)
Hemoglobin: 12 g/dL (ref 12.0–15.0)
MCH: 26.2 pg (ref 26.0–34.0)
MCHC: 31.4 g/dL (ref 30.0–36.0)
MCV: 83.4 fL (ref 80.0–100.0)
Platelets: 213 10*3/uL (ref 150–400)
RBC: 4.58 MIL/uL (ref 3.87–5.11)
RDW: 24.8 % — ABNORMAL HIGH (ref 11.5–15.5)
WBC: 18.8 10*3/uL — ABNORMAL HIGH (ref 4.0–10.5)
nRBC: 0 % (ref 0.0–0.2)

## 2019-10-08 LAB — HEPARIN LEVEL (UNFRACTIONATED)
Heparin Unfractionated: 0.41 IU/mL (ref 0.30–0.70)
Heparin Unfractionated: 0.31 IU/mL (ref 0.30–0.70)

## 2019-10-08 IMAGING — DX DG ABDOMEN 1V
1 series · 1 of 1 positions shown · non-contrast
Comparison: CT [DATE]

CLINICAL DATA: Multiple bowel movements

EXAM:
ABDOMEN - 1 VIEW

[abdomen supine]
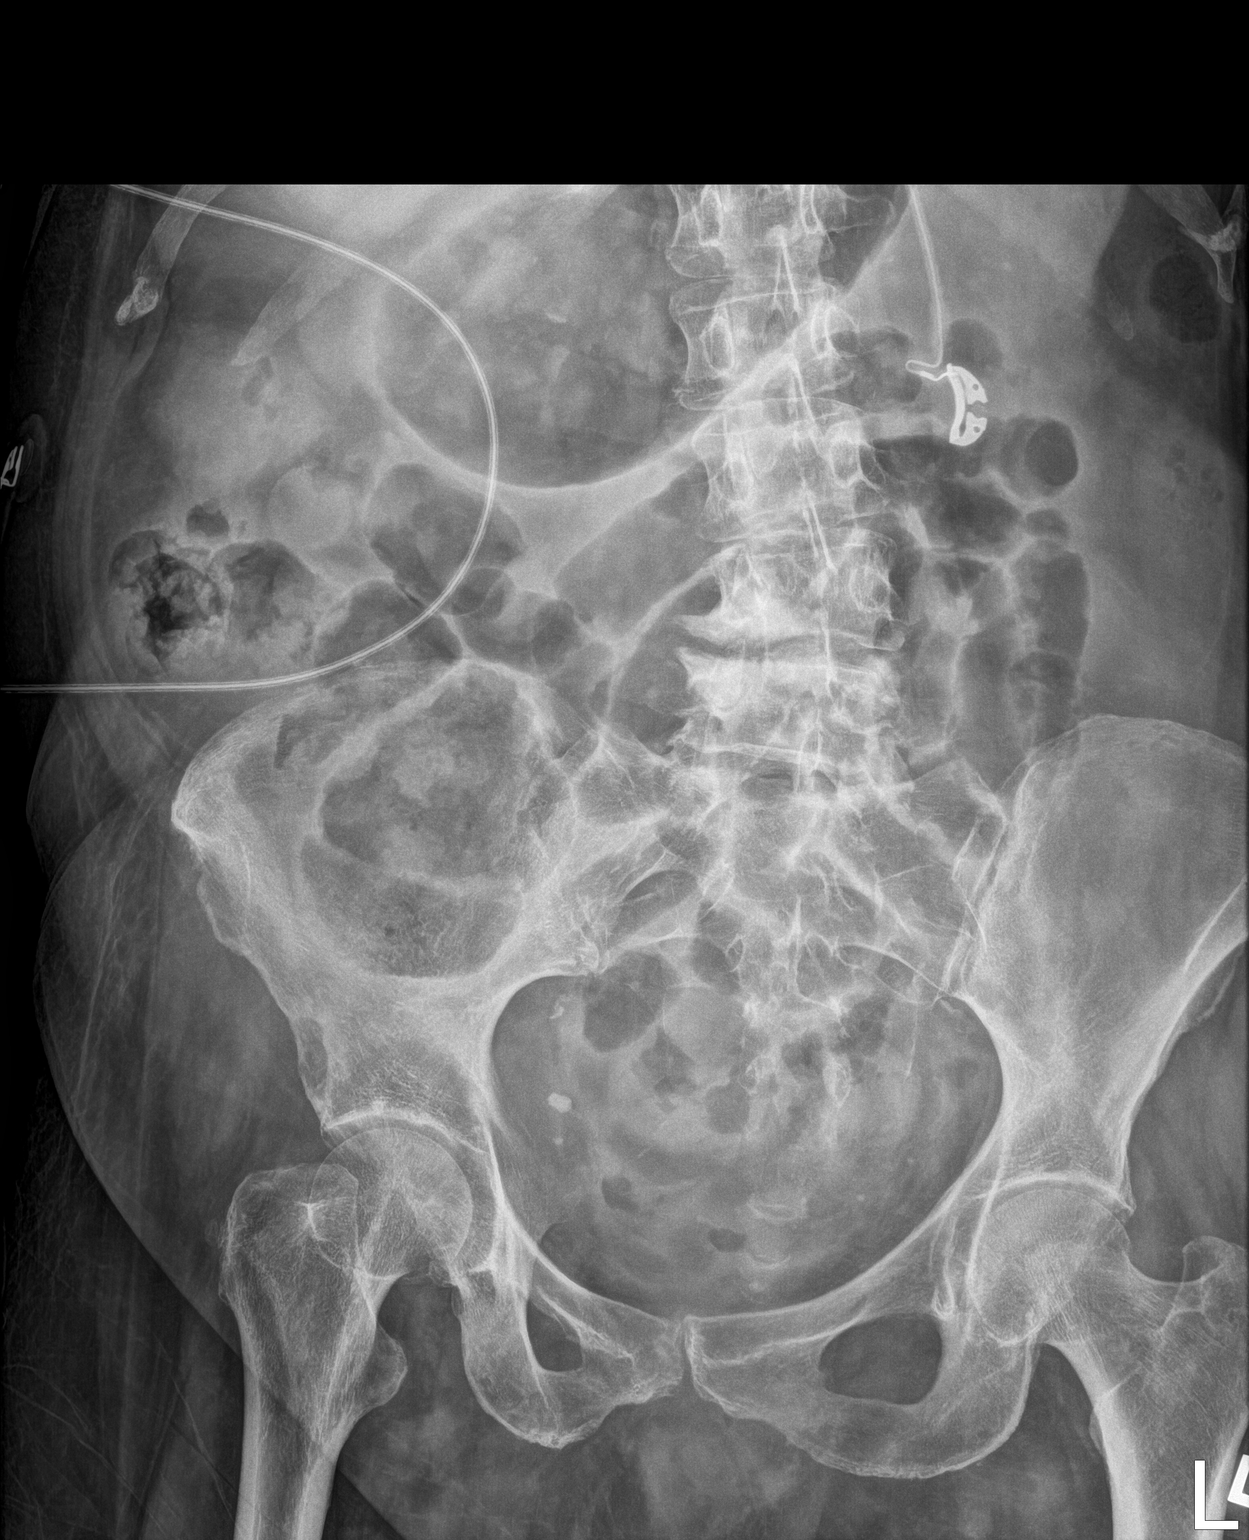

[1 of 1 positions shown; findings below may reference images not displayed]

FINDINGS: Resolution of the large rectal stool ball seen on comparison CT from
1 day prior. Some persistent air-filled loops of clustered mid
abdominal small bowel without frank distention are nonspecific. Only
small to moderate residual colonic stool burden. Degenerative
changes again noted in the spine hips and pelvis. No suspicious
calcifications. Multiple phleboliths in the pelvis. Additional
vascular calcium over the course of the aorta and iliac arteries.
Remaining soft tissues are unremarkable.
IMPRESSION: 1. Resolution of the large rectal stool ball seen on comparison CT
from 1 day prior.
2. Some persistent air-filled loops of mid abdominal small bowel
without frank distention are nonspecific. Could reflect ileus or
early obstruction.

## 2019-10-08 MED ORDER — CHLORHEXIDINE GLUCONATE CLOTH 2 % EX PADS
6.0000 | MEDICATED_PAD | Freq: Every day | CUTANEOUS | Status: DC
  Administered 2019-10-09 – 2019-10-10 (×2): 6 via TOPICAL

## 2019-10-08 MED ORDER — LIDOCAINE HCL URETHRAL/MUCOSAL 2 % EX GEL
CUTANEOUS | Status: AC
  Administered 2019-10-08: 1
  Filled 2019-10-08: qty 10

## 2019-10-08 NOTE — Consult Note (Signed)
Urology Consult  I have been asked to see the patient by Dr. Earnest Conroy, for evaluation and management of acute urinary retention.  Chief Complaint: Dribbling urinary stream, lower abdominal pain  History of Present Illness: Krista Cisneros is a 79 y.o. year old female who presented to the ED yesterday with reports of generalized weakness, abdominal pain, and constipation with new onset of diarrhea following stool softener use.    Upon presentation, she was found to have leukocytosis to 24.2; AKI with creatinine 1.97 over baseline 1.2; and UA with nitrites, 21-50 RBCs/hpf, >50 WBCs/hpf, and many bacteria. Urine and blood cultures pending; on antibiotics as below.  CTAP findings notable for distended urinary bladder and a large volume of stool in the rectosigmoid colon.  Patient failed multiple attempts at I&O catheterization at the bedside with bladder scan >934mL. Urology was consulted for evaluation of urinary retention.   On interview, patient reports chronic constipation and a months-long history of the sensation of incomplete bladder emptying.  She denies a history of urinary retention and has not seen a urologist before.  She is s/p remote hysterectomy with no reconstructive surgeries.  She states her difficulty urinating became worse approximately 2 weeks ago at around the same time as she began to experience worsening constipation.  Additionally, she reports foul-smelling urine during this period without dysuria.  Anti-infectives (From admission, onward)   Start     Dose/Rate Route Frequency Ordered Stop   10/08/19 0000  aztreonam (AZACTAM) 1 g in sodium chloride 0.9 % 100 mL IVPB     Discontinue     1 g 200 mL/hr over 30 Minutes Intravenous Every 8 hours 10/07/19 1849     10/07/19 1900  metroNIDAZOLE (FLAGYL) IVPB 500 mg  Status:  Discontinued        500 mg 100 mL/hr over 60 Minutes Intravenous  Once 10/07/19 1849 10/07/19 1922   10/07/19 1415  aztreonam (AZACTAM) 2 g in  sodium chloride 0.9 % 100 mL IVPB        2 g 200 mL/hr over 30 Minutes Intravenous  Once 10/07/19 1408 10/07/19 1632   10/07/19 1415  metroNIDAZOLE (FLAGYL) IVPB 500 mg        500 mg 100 mL/hr over 60 Minutes Intravenous  Once 10/07/19 1408 10/07/19 1832   10/07/19 1415  vancomycin (VANCOCIN) IVPB 1000 mg/200 mL premix        1,000 mg 200 mL/hr over 60 Minutes Intravenous  Once 10/07/19 1408 10/07/19 1754       Past Medical History:  Diagnosis Date  . Anxiety   . Arthritis   . CAD (coronary artery disease)    a. NSTEMI 12/19; b. LHC 04/10/18: pLAD 95%, mLAD 80%, m-dLCx 95%, OM3-1 lesion 40%, OM3-2 lesion 60%, mRCA 50%, EF 35-45%, successful PCI/DES x 2 to the LAD with recommended staged PCI of the LCx in a few weeks  . Cancer [facility])    Right Breast Cancer  . Chronic systolic CHF (congestive heart failure) ([place])    a. TTE 12/19: EF 30-35%, anteroseptal, anterior, and apical HK, Gr1DD, mild to mod MR, mildly dilated LA, RVSF nl  . COPD (chronic obstructive pulmonary disease) ([place])   . Depression   . Dyspnea    with exertion  . GERD (gastroesophageal reflux disease)   . Hyperlipidemia   . Hypertension   . Personal history of chemotherapy 2018   chemo prior to lumpectomy of right breast  . Personal history of radiation therapy 2019  right breast ca    Past Surgical History:  Procedure Laterality Date  . ABDOMINAL HYSTERECTOMY  1990   Partial  . BREAST BIOPSY Right 10/04/2016   axilla lymph node (metastatic carcinoma) and axillay tail mass biopsy-invasive mammary carcinoma  . BREAST LUMPECTOMY Right 03/21/2017   chemo first, [facility] and metastatic LN  . BREAST LUMPECTOMY Right 05/21/2017   re-excision for clip  . BREAST LUMPECTOMY WITH NEEDLE LOCALIZATION Right 03/21/2017   Procedure: BREAST LUMPECTOMY WITH NEEDLE LOCALIZATION;  Surgeon: Clayburn Pert, MD;  Location: [facility] ORS;  Service: General;  Laterality: Right;  . CORONARY STENT INTERVENTION N/A 04/10/2018   Procedure:  CORONARY STENT INTERVENTION;  Surgeon: Wellington Hampshire, MD;  Location: [place] CV LAB;  Service: Cardiovascular;  Laterality: N/A;  . CORONARY STENT INTERVENTION N/A 05/14/2018   Procedure: CORONARY STENT INTERVENTION;  Surgeon: Nelva Bush, MD;  Location: [place] [facility] LAB;  Service: Cardiovascular;  Laterality: N/A;  . DILATION AND CURETTAGE OF UTERUS    . INGUINAL HERNIA REPAIR Right 03/26/2017   Procedure: HERNIA REPAIR INGUINAL INCARCERATED;  Surgeon: Jules Husbands, MD;  Location: [facility] ORS;  Service: General;  Laterality: Right;  . LEFT HEART CATH AND CORONARY ANGIOGRAPHY N/A 04/10/2018   Procedure: LEFT HEART CATH AND CORONARY ANGIOGRAPHY poss PCI;  Surgeon: Minna Merritts, MD;  Location: [place] CV LAB;  Service: Cardiovascular;  Laterality: N/A;  . PORTACATH PLACEMENT Left 10/24/2016   Procedure: INSERTION PORT-A-CATH;  Surgeon: [name], MD;  Location: [facility] ORS;  Service: General;  Laterality: Left;  . RE-EXCISION OF BREAST LUMPECTOMY Right 05/21/2017   Procedure: RE-EXCISION OF BREAST LUMPECTOMY;  Surgeon: Clayburn Pert, MD;  Location: [facility] ORS;  Service: General;  Laterality: Right;  . SENTINEL NODE BIOPSY Right 03/21/2017   Procedure: SENTINEL NODE BIOPSY;  Surgeon: Clayburn Pert, MD;  Location: [facility] ORS;  Service: General;  Laterality: Right;    Home Medications:  Current Meds  Medication Sig  . ADVAIR DISKUS 500-50 MCG/DOSE AEPB Inhale 1 puff into the lungs 2 (two) times daily.   Marland Kitchen aspirin EC 81 MG tablet Take 81 mg by mouth daily.  . Calcium-Vitamin D-Vitamin K 175-102-58 MG-UNT-MCG TABS Take 1 tablet by mouth daily.  . carvedilol (COREG) 6.25 MG tablet TAKE 1 TABLET BY MOUTH TWICE A DAY (Patient taking differently: Take 6.25 mg by mouth 2 (two) times daily with a meal. )  . cholecalciferol (VITAMIN D3) 25 MCG (1000 UT) tablet Take 1,000 Units by mouth daily.  . clopidogrel (PLAVIX) 75 MG tablet Take 1 tablet (75 mg total) by mouth daily with  breakfast.  . Coenzyme Q10 300 MG CAPS Take 300 mg by mouth daily.   . DULoxetine (CYMBALTA) 30 MG capsule Take 30 mg by mouth every morning.   . enalapril (VASOTEC) 5 MG tablet TAKE 1 TABLET BY MOUTH EVERY DAY (Patient taking differently: Take 5 mg by mouth daily. )  . isosorbide mononitrate (IMDUR) 30 MG 24 hr tablet Take 0.5 tablets (15 mg total) by mouth daily.  Marland Kitchen letrozole (FEMARA) 2.5 MG tablet TAKE 1 TABLET BY MOUTH EVERY DAY (Patient taking differently: Take 2.5 mg by mouth daily. )  . magnesium oxide (MAG-OX) 400 MG tablet Take 400 mg by mouth daily.  . Melatonin 5 MG TABS Take 10 mg by mouth at bedtime as needed (sleep).  . Melatonin-Pyridoxine 5-1 MG TABS Take 1 tablet by mouth daily.  . montelukast (SINGULAIR) 10 MG tablet Take 10 mg by mouth at bedtime.   Marland Kitchen  pantoprazole (PROTONIX) 40 MG tablet Take 1 tablet (40 mg total) by mouth daily.  . potassium chloride SA (KLOR-CON M20) 20 MEQ tablet Take 1 tablet (20 mEq total) by mouth daily.  Marland Kitchen spironolactone (ALDACTONE) 25 MG tablet Take 0.5 tablets (12.5 mg total) by mouth daily.    Allergies:  Allergies  Allergen Reactions  . Nsaids     Bleeding risk  . Penicillins Anaphylaxis, Swelling and Rash    Has patient had a PCN reaction causing immediate rash, facial/tongue/throat swelling, SOB or lightheadedness with hypotension: Yes Has patient had a PCN reaction causing severe rash involving mucus membranes or skin necrosis: No Has patient had a PCN reaction that required hospitalization: No  Has patient had a PCN reaction occurring within the last 10 years: No If all of the above answers are "NO", then may proceed with Cephalosporin use.   . Atorvastatin Other (See Comments)    Muscle Pain  . Gabapentin Swelling  . Ezetimibe Other (See Comments)    Muscle pain  . Aspirin     GI bleeding risk    Family History  Problem Relation Age of Onset  . Colon cancer Mother   . Breast cancer Neg Hx     Social History:  reports that  she quit smoking about 31 years ago. Her smoking use included cigarettes. She smoked 0.50 packs per day. She has never used smokeless tobacco. She reports current alcohol use. She reports that she does not use drugs.  ROS: A complete review of systems was performed.  All systems are negative except for pertinent findings as noted.  Physical Exam:  Vital signs in last 24 hours: Pulse Rate:  [72-112] 76 (06/16 0900) Resp:  [13-28] 20 (06/16 1200) BP: (88-181)/(45-164) 117/55 (06/16 1200) SpO2:  [89 %-100 %] 97 % (06/16 0900) Constitutional:  Alert and oriented, no acute distress HEENT: [place] AT, moist mucus membranes Cardiovascular: No clubbing, cyanosis, or edema. Respiratory: Normal respiratory effort GU: Palpable bladder above the umbilicus  Skin: No rashes, bruises or suspicious lesions Neurologic: Grossly intact, no focal deficits, moving all 4 extremities Psychiatric: Normal mood and affect  Laboratory Data:  Recent Labs    10/07/19 1146 10/08/19 0623  WBC 24.2* 18.8*  HGB 14.5 12.0  HCT 46.4* 38.2   Recent Labs    10/07/19 1146 10/08/19 0623  NA 135 136  K 5.5* 4.3  CL 99 108  CO2 23 22  GLUCOSE 178* 107*  BUN 50* 41*  CREATININE 1.97* 1.41*  CALCIUM 8.9 7.5*   Recent Labs    10/07/19 1957  INR 1.2   Urinalysis    Component Value Date/Time   COLORURINE YELLOW (A) 10/08/2019 0208   APPEARANCEUR CLOUDY (A) 10/08/2019 0208   LABSPEC 1.019 10/08/2019 0208   PHURINE 5.0 10/08/2019 0208   GLUCOSEU NEGATIVE 10/08/2019 0208   HGBUR MODERATE (A) 10/08/2019 0208   BILIRUBINUR NEGATIVE 10/08/2019 0208   KETONESUR NEGATIVE 10/08/2019 0208   PROTEINUR NEGATIVE 10/08/2019 0208   NITRITE POSITIVE (A) 10/08/2019 0208   LEUKOCYTESUR LARGE (A) 10/08/2019 0208   Results for orders placed or performed during the hospital encounter of 10/07/19  Blood culture (routine x 2)     Status: None (Preliminary result)   Collection Time: 10/07/19  2:35 PM   Specimen: BLOOD   Result Value Ref Range Status   Specimen Description BLOOD LEFT ANTECUBITAL  Final   Special Requests   Final    BOTTLES DRAWN AEROBIC AND ANAEROBIC Blood Culture adequate volume  Culture   Final    NO GROWTH < 24 HOURS Performed at [facility], [address] [place], [place] [address]    Report Status PENDING  Incomplete  Blood culture (routine x 2)     Status: None (Preliminary result)   Collection Time: 10/07/19  4:55 PM   Specimen: BLOOD  Result Value Ref Range Status   Specimen Description BLOOD PORTA CATH  Final   Special Requests   Final    BOTTLES DRAWN AEROBIC AND ANAEROBIC Blood Culture adequate volume   Culture   Final    NO GROWTH < 12 HOURS Performed at [facility], [address], [place], [place] [address]    Report Status PENDING  Incomplete  SARS Coronavirus 2 by RT PCR (hospital order, performed in [place] hospital lab) Nasopharyngeal Nasopharyngeal Swab     Status: None   Collection Time: 10/07/19  4:56 PM   Specimen: Nasopharyngeal Swab  Result Value Ref Range Status   SARS Coronavirus 2 NEGATIVE NEGATIVE Final    Comment: (NOTE) SARS-CoV-2 target nucleic acids are NOT DETECTED.  The SARS-CoV-2 RNA is generally detectable in upper and lower respiratory specimens during the acute phase of infection. The lowest concentration of SARS-CoV-2 viral copies this assay can detect is 250 copies / mL. A negative result does not preclude SARS-CoV-2 infection and should not be used as the sole basis for treatment or other patient management decisions.  A negative result may occur with improper specimen collection / handling, submission of specimen other than nasopharyngeal swab, presence of viral mutation(s) within the areas targeted by this assay, and inadequate number of viral copies (<250 copies / mL). A negative result must be combined with clinical observations, patient history, and epidemiological information.  Fact Sheet for  Patients:   StrictlyIdeas.no  Fact Sheet for Healthcare Providers: BankingDealers.co.za  This test is not yet approved or  cleared by the [place] FDA and has been authorized for detection and/or diagnosis of SARS-CoV-2 by FDA under an Emergency Use Authorization (EUA).  This EUA will remain in effect (meaning this test can be used) for the duration of the COVID-19 declaration under Section 564(b)(1) of the Act, 21 U.S.C. section 360bbb-3(b)(1), unless the authorization is terminated or revoked sooner.  Performed at [facility], [address] [place],  [address]     Radiologic Imaging: CT ABDOMEN PELVIS WO CONTRAST  Result Date: 10/07/2019 CLINICAL DATA:  Abdominal distension, nausea and vomiting. EXAM: CT ABDOMEN AND PELVIS WITHOUT CONTRAST TECHNIQUE: Multidetector CT imaging of the abdomen and pelvis was performed following the standard protocol without IV contrast. COMPARISON:  CT abdomen and pelvis 03/26/2017. FINDINGS: Lower chest: Extensive emphysematous disease is present in the lung bases. No pleural or pericardial effusion. Heart size is normal. Calcific coronary artery disease noted. Hepatobiliary: No focal liver abnormality is seen. No gallstones, gallbladder wall thickening, or biliary dilatation. Pancreas: Unremarkable. No pancreatic ductal dilatation or surrounding inflammatory changes. Spleen: Normal in size without focal abnormality. Adrenals/Urinary Tract: The adrenal glands appear normal. There is no hydronephrosis on the right or left. Nonobstructing 0.3 cm stone in the left kidney is noted. The kidneys otherwise appear normal. Urinary bladder is distended. Stomach/Bowel: Stomach is within normal limits. Appendix appears normal. No evidence of bowel wall thickening, distention, or inflammatory changes. Large volume of stool in the rectosigmoid colon noted. Vascular/Lymphatic: No significant vascular  findings are present. No enlarged abdominal or pelvic lymph nodes. Reproductive: Status post hysterectomy. No adnexal masses.  Other: None. Musculoskeletal: No acute abnormality. Convex left lumbar scoliosis and degenerative disease at L4-5 are seen. IMPRESSION: No acute abnormality abdomen or pelvis. Distended urinary bladder noted. Large volume of stool in the rectosigmoid colon. Aortic Atherosclerosis (ICD10-I70.0) and Emphysema (ICD10-J43.9). Small nonobstructing stone left kidney. Electronically Signed   By: Inge Rise M.D.   On: 10/07/2019 15:18   Assessment & Plan:  79 year old female with no known urologic history presents with acute urinary retention and AKI in the setting of grossly infected UA with microscopic hematuria and constipation.  Etiology of urinary retention likely mixed constipation and UTI, however given her reports of a months-long prodrome of incomplete voiding sensation, recommend outpatient urology follow-up. Microscopic hematuria likely represents mucosal bleeding in the setting of bladder distention. Recommend outpatient repeat UA to prove resolution following episode of acute retention.   39 French Foley catheter placed at the bedside today with immediate drainage of >675mL amber urine. She will require Foley catheterization for 7-10 days, antibiotics for UTI, and outpatient follow-up for voiding trial and repeat UA.  Recommendations: -Agree with empiric antibiotics for UTI, follow urine cultures -Keep Foley in place for 7-10 days with plans for outpatient voiding trial in clinic -Bowel regimen for management of constipation  Thank you for involving me in this patient's care, please page with any further questions or concerns.  Debroah Loop, PA-C 10/08/2019 1:05 PM

## 2019-10-08 NOTE — ED Notes (Signed)
Dr Earnest Conroy messaged to notify regarding multiple BM's and pt's incontinence/unawareness of BM, questioned if enema still needed. KUB ordered

## 2019-10-08 NOTE — Progress Notes (Signed)
[place] for heparin Indication: chest pain/ACS  Allergies  Allergen Reactions  . Nsaids     Bleeding risk  . Penicillins Anaphylaxis, Swelling and Rash    Has patient had a PCN reaction causing immediate rash, facial/tongue/throat swelling, SOB or lightheadedness with hypotension: Yes Has patient had a PCN reaction causing severe rash involving mucus membranes or skin necrosis: No Has patient had a PCN reaction that required hospitalization: No  Has patient had a PCN reaction occurring within the last 10 years: No If all of the above answers are "NO", then may proceed with Cephalosporin use.   . Atorvastatin Other (See Comments)    Muscle Pain  . Gabapentin Swelling  . Ezetimibe Other (See Comments)    Muscle pain  . Aspirin     GI bleeding risk    Patient Measurements: Height: 5\' 2"  (157.5 cm) Weight: 63.5 kg (140 lb) IBW/kg (Calculated) : 50.1 Heparin Dosing Weight: 63 kg  Vital Signs: BP: 113/53 (06/16 0530) Pulse Rate: 76 (06/16 0530)  Labs: Recent Labs    10/07/19 1146 10/07/19 1435 10/07/19 1957 10/08/19 0623 10/08/19 0729  HGB 14.5  --   --  12.0  --   HCT 46.4*  --   --  38.2  --   PLT 273  --   --  213  --   APTT  --   --  37*  --   --   LABPROT  --   --  14.4  --   --   INR  --   --  1.2  --   --   HEPARINUNFRC  --   --   --   --  0.41  CREATININE 1.97*  --   --  1.41*  --   TROPONINIHS 562* 484*  --   --   --     Estimated Creatinine Clearance: 28.8 mL/min (A) (by C-G formula based on SCr of 1.41 mg/dL (H)).   Medical History: Past Medical History:  Diagnosis Date  . Anxiety   . Arthritis   . CAD (coronary artery disease)    a. NSTEMI 12/19; b. LHC 04/10/18: pLAD 95%, mLAD 80%, m-dLCx 95%, OM3-1 lesion 40%, OM3-2 lesion 60%, mRCA 50%, EF 35-45%, successful PCI/DES x 2 to the LAD with recommended staged PCI of the LCx in a few weeks  . Cancer [facility])    Right Breast Cancer  . Chronic systolic CHF  (congestive heart failure) ([place])    a. TTE 12/19: EF 30-35%, anteroseptal, anterior, and apical HK, Gr1DD, mild to mod MR, mildly dilated LA, RVSF nl  . COPD (chronic obstructive pulmonary disease) ([place])   . Depression   . Dyspnea    with exertion  . GERD (gastroesophageal reflux disease)   . Hyperlipidemia   . Hypertension   . Personal history of chemotherapy 2018   chemo prior to lumpectomy of right breast  . Personal history of radiation therapy 2019   right breast ca     Assessment: 79 year old female presented with weakness. Elevated troponin likely s/t supply-demand mismatch per Cardiology. No indication for urgent cath, but may consider ischemic evaluation pending resolution of acute medical problems. Pharmacy consulted for heparin drip. No anticoagulation PTA.  Started heparin with 3700 unit bolus followed by drip at 750 units/hr.  6/16 0729 HL 0.41 therapeutic x 1  Goal of Therapy:  Heparin level 0.3-0.7 units/ml Monitor platelets by anticoagulation protocol: Yes   Plan:  Patient  is therapeutic-  Continue current rate of 750 units/hr.  Will recheck confirmatory level in 8 hours per protocol.  CBC daily while on heparin drip.  Lu Duffel, PharmD, BCPS Clinical Pharmacist 10/08/2019 7:56 AM

## 2019-10-08 NOTE — Progress Notes (Signed)
PROGRESS NOTE    Krista Cisneros  MVH:[id number]  DOB: [birth year]  PCP: Sharyne Peach, MD Admit date:10/07/2019 79 y.o. female with history h/o CAD, chronic systolic CHF with EF 84% (normalized to 55% in May 2020), COPD, right breast cancer s/p chemoradiation, depression/anxiety, hypertension, hyperlipidemia, GERD presented to the ED with complaints of feeling weak over the last couple of days.  Today she woke up feeling very tired and had no energy.  She also reports constipation as well as trouble urinating lately-she describes as having to strain to urinate.  She apparently was having intermittent chest discomfort a week back for which she sought medical attention with her primary cardiologist and was prescribed Imdur about 7 to 10 days back.  She denies any fever or chills.  She denies any chest pain but reports feeling sweaty this morning.  She denies any headaches, melena or hematochezia.  Her last good bowel movement was 3 weeks back.  She takes Aldactone daily but has not taken Lasix (prescribed for as needed use) for couple of months now. ED Course: Afebrile, Blood pressure 89/59-118/45, respiratory rate 22, pulse 63, temp 98.91F, O2 sat 96% on room air.  WBC 24.2, hemoglobin 14.5, platelet 273.  Sodium 135, potassium 5.5, chloride 99, bicarb 23, glucose 178, BUN 50, creatinine 1.97 (baseline appears to be around 1.2), calcium 8.9.  Magnesium 3.1, albumin 3.3, lipase 19.  LFTs within normal limits.  Troponin 562->484.  Lactate 2.1->0.9.  Chest x-ray unremarkable.  UA pending.  CT of the abdomen and pelvis showed significant amount of stool burden in rectosigmoid colon, distended urinary bladder, nonobstructing left nephrolithiasis.  Patient suspected to have sepsis, dehydration and started on IV fluids with broad-spectrum IV antibiotics.  Per ED nurse straight cath attempted but patient had just voided in her pants.  Urine sample not sent.  Postvoid residual being checked. Hospital course:  Patient admitted to [facility] for further evaluation and management.  Subjective:  Patient had worsening urinary retention overnight and indwelling Foley catheter could not be placed.  Bladder scan revealed greater than 900 mL retention.  Seen by urology this morning and Foley placed.  Per bedside nurse, patient had several bowel movements overnight.  Mag citrate/enema withheld.  Patient still feels constipated this morning.  Objective: Vitals:   10/08/19 1258 10/08/19 1518 10/08/19 1559 10/08/19 1723  BP: 114/62 (!) 100/58 (!) 105/46 122/61  Pulse: 79 89 85 89  Resp: 20 (!) 23 (!) 21 17  Temp:    97.9 F (36.6 C)  TempSrc:    Oral  SpO2: 100% 97% 100%   Weight:      Height:        Intake/Output Summary (Last 24 hours) at 10/08/2019 1735 Last data filed at 10/08/2019 1314 Gross per 24 hour  Intake 100 ml  Output 1225 ml  Net -1125 ml   Filed Weights   10/07/19 1132  Weight: 63.5 kg    Physical Examination:  General exam: Appears calm and comfortable  Respiratory system: Clear to auscultation. Respiratory effort normal. Cardiovascular system: S1 & S2 heard, RRR. No JVD, murmurs, rubs, gallops or clicks. No pedal edema. Gastrointestinal system: Abdomen is nondistended, soft and nontender. Normal bowel sounds heard. Central nervous system: Alert and oriented. No new focal neurological deficits. Extremities: No contractures, edema or joint deformities.  Skin: No rashes, lesions or ulcers Psychiatry: Judgement and insight appear normal. Mood & affect appropriate.   Data Reviewed: I have personally reviewed following labs and imaging studies  CBC: Recent Labs  Lab 10/07/19 1146 10/08/19 0623  WBC 24.2* 18.8*  HGB 14.5 12.0  HCT 46.4* 38.2  MCV 83.6 83.4  PLT 273 277   Basic Metabolic Panel: Recent Labs  Lab 10/07/19 1146 10/07/19 1435 10/08/19 0623  NA 135  --  136  K 5.5*  --  4.3  CL 99  --  108  CO2 23  --  22  GLUCOSE 178*  --  107*  BUN 50*  --  41*   CREATININE 1.97*  --  1.41*  CALCIUM 8.9  --  7.5*  MG  --  3.1*  --    GFR: Estimated Creatinine Clearance: 28.8 mL/min (A) (by C-G formula based on SCr of 1.41 mg/dL (H)). Liver Function Tests: Recent Labs  Lab 10/07/19 1146  AST 30  ALT 16  ALKPHOS 59  BILITOT 0.8  PROT 7.6  ALBUMIN 3.3*   Recent Labs  Lab 10/07/19 1146  LIPASE 19   No results for input(s): AMMONIA in the last 168 hours. Coagulation Profile: Recent Labs  Lab 10/07/19 1957  INR 1.2   Cardiac Enzymes: No results for input(s): CKTOTAL, CKMB, CKMBINDEX, TROPONINI in the last 168 hours. BNP (last 3 results) No results for input(s): PROBNP in the last 8760 hours. HbA1C: No results for input(s): HGBA1C in the last 72 hours. CBG: No results for input(s): GLUCAP in the last 168 hours. Lipid Profile: No results for input(s): CHOL, HDL, LDLCALC, TRIG, CHOLHDL, LDLDIRECT in the last 72 hours. Thyroid Function Tests: No results for input(s): TSH, T4TOTAL, FREET4, T3FREE, THYROIDAB in the last 72 hours. Anemia Panel: No results for input(s): VITAMINB12, FOLATE, FERRITIN, TIBC, IRON, RETICCTPCT in the last 72 hours. Sepsis Labs: Recent Labs  Lab 10/07/19 1436 10/07/19 1656  LATICACIDVEN 2.1* 0.9    Recent Results (from the past 240 hour(s))  Blood culture (routine x 2)     Status: None (Preliminary result)   Collection Time: 10/07/19  2:35 PM   Specimen: BLOOD  Result Value Ref Range Status   Specimen Description BLOOD LEFT ANTECUBITAL  Final   Special Requests   Final    BOTTLES DRAWN AEROBIC AND ANAEROBIC Blood Culture adequate volume   Culture   Final    NO GROWTH < 24 HOURS Performed at [facility], [address] [place], [place] [address]    Report Status PENDING  Incomplete  Blood culture (routine x 2)     Status: None (Preliminary result)   Collection Time: 10/07/19  4:55 PM   Specimen: BLOOD  Result Value Ref Range Status   Specimen Description BLOOD PORTA CATH  Final    Special Requests   Final    BOTTLES DRAWN AEROBIC AND ANAEROBIC Blood Culture adequate volume   Culture   Final    NO GROWTH < 12 HOURS Performed at [facility], [address], [place],  [address]    Report Status PENDING  Incomplete  SARS Coronavirus 2 by RT PCR (hospital order, performed in [place] hospital lab) Nasopharyngeal Nasopharyngeal Swab     Status: None   Collection Time: 10/07/19  4:56 PM   Specimen: Nasopharyngeal Swab  Result Value Ref Range Status   SARS Coronavirus 2 NEGATIVE NEGATIVE Final    Comment: (NOTE) SARS-CoV-2 target nucleic acids are NOT DETECTED.  The SARS-CoV-2 RNA is generally detectable in upper and lower respiratory specimens during the acute phase of infection. The lowest concentration of SARS-CoV-2 viral copies this assay can detect  is 250 copies / mL. A negative result does not preclude SARS-CoV-2 infection and should not be used as the sole basis for treatment or other patient management decisions.  A negative result may occur with improper specimen collection / handling, submission of specimen other than nasopharyngeal swab, presence of viral mutation(s) within the areas targeted by this assay, and inadequate number of viral copies (<250 copies / mL). A negative result must be combined with clinical observations, patient history, and epidemiological information.  Fact Sheet for Patients:   StrictlyIdeas.no  Fact Sheet for Healthcare Providers: BankingDealers.co.za  This test is not yet approved or  cleared by the [place] FDA and has been authorized for detection and/or diagnosis of SARS-CoV-2 by FDA under an Emergency Use Authorization (EUA).  This EUA will remain in effect (meaning this test can be used) for the duration of the COVID-19 declaration under Section 564(b)(1) of the Act, 21 U.S.C. section 360bbb-3(b)(1), unless the authorization is terminated or revoked  sooner.  Performed at [facility], [address], [place], [place] [address]       Radiology Studies: CT ABDOMEN PELVIS WO CONTRAST  Result Date: 10/07/2019 CLINICAL DATA:  Abdominal distension, nausea and vomiting. EXAM: CT ABDOMEN AND PELVIS WITHOUT CONTRAST TECHNIQUE: Multidetector CT imaging of the abdomen and pelvis was performed following the standard protocol without IV contrast. COMPARISON:  CT abdomen and pelvis 03/26/2017. FINDINGS: Lower chest: Extensive emphysematous disease is present in the lung bases. No pleural or pericardial effusion. Heart size is normal. Calcific coronary artery disease noted. Hepatobiliary: No focal liver abnormality is seen. No gallstones, gallbladder wall thickening, or biliary dilatation. Pancreas: Unremarkable. No pancreatic ductal dilatation or surrounding inflammatory changes. Spleen: Normal in size without focal abnormality. Adrenals/Urinary Tract: The adrenal glands appear normal. There is no hydronephrosis on the right or left. Nonobstructing 0.3 cm stone in the left kidney is noted. The kidneys otherwise appear normal. Urinary bladder is distended. Stomach/Bowel: Stomach is within normal limits. Appendix appears normal. No evidence of bowel wall thickening, distention, or inflammatory changes. Large volume of stool in the rectosigmoid colon noted. Vascular/Lymphatic: No significant vascular findings are present. No enlarged abdominal or pelvic lymph nodes. Reproductive: Status post hysterectomy. No adnexal masses. Other: None. Musculoskeletal: No acute abnormality. Convex left lumbar scoliosis and degenerative disease at L4-5 are seen. IMPRESSION: No acute abnormality abdomen or pelvis. Distended urinary bladder noted. Large volume of stool in the rectosigmoid colon. Aortic Atherosclerosis (ICD10-I70.0) and Emphysema (ICD10-J43.9). Small nonobstructing stone left kidney. Electronically Signed   By: Inge Rise M.D.   On: 10/07/2019 15:18    DG Abd 1 View  Result Date: 10/08/2019 CLINICAL DATA:  Multiple bowel movements EXAM: ABDOMEN - 1 VIEW COMPARISON:  CT 10/07/2019 FINDINGS: Resolution of the large rectal stool ball seen on comparison CT from 1 day prior. Some persistent air-filled loops of clustered mid abdominal small bowel without frank distention are nonspecific. Only small to moderate residual colonic stool burden. Degenerative changes again noted in the spine hips and pelvis. No suspicious calcifications. Multiple phleboliths in the pelvis. Additional vascular calcium over the course of the aorta and iliac arteries. Remaining soft tissues are unremarkable. IMPRESSION: 1. Resolution of the large rectal stool ball seen on comparison CT from 1 day prior. 2. Some persistent air-filled loops of mid abdominal small bowel without frank distention are nonspecific. Could reflect ileus or early obstruction. Electronically Signed   By: Lovena Le M.D.   On: 10/08/2019 15:11   DG Chest Portable  1 View  Result Date: 10/07/2019 CLINICAL DATA:  Weakness EXAM: PORTABLE CHEST 1 VIEW COMPARISON:  05/28/2019 FINDINGS: Cardiac shadow is within normal limits. Stable aortic calcifications are seen. Lungs are well aerated bilaterally. Diffuse emphysematous changes are seen with crowding of markings in the bases bilaterally. Left chest port is again seen and stable. No bony abnormality is noted. IMPRESSION: Changes of COPD without acute abnormality. Aortic Atherosclerosis (ICD10-I70.0) and Emphysema (ICD10-J43.9). Electronically Signed   By: Inez Catalina M.D.   On: 10/07/2019 14:37        Scheduled Meds: . aspirin EC  81 mg Oral Daily  . calcium-vitamin D  1 tablet Oral Daily  . carvedilol  3.125 mg Oral BID  . Chlorhexidine Gluconate Cloth  6 each Topical Daily  . cholecalciferol  1,000 Units Oral Daily  . clopidogrel  75 mg Oral Q breakfast  . DULoxetine  30 mg Oral BH-q7a  . letrozole  2.5 mg Oral Daily  . magnesium oxide  400 mg Oral  Daily  . mometasone-formoterol  2 puff Inhalation BID  . montelukast  10 mg Oral QHS  . pantoprazole  40 mg Oral Daily  . rosuvastatin  20 mg Oral q1800  . sodium chloride flush  3 mL Intravenous Once   Continuous Infusions: . sodium chloride 75 mL/hr at 10/07/19 1957  . aztreonam 1 g (10/08/19 1630)  . heparin 750 Units/hr (10/07/19 1951)     Assessment/Plan:  1.    UTI with sepsis: Patient had hypotension, tachypnea, lactic acidosis on presentation with labs showing significant leukocytosis.  UA now shows significant pyuria (greater than 50) with bacteriuria, positive nitrite and large leukocyte esterase.  Agree with broad-spectrum antibiotics for now until urine cultures available.   Leukocytosis appears to be downtrending.    2. Abdominal pain: Secondary to stool burden/urinary retention.  Patient was able to void in the ED before straight cath yesterday but had recurrence of significant urinary retention overnight requiring urology intervention/Foley catheter insertion.  Appreciate urology input..  CT abdomen did show stool burden yesterday and laxatives were ordered but patient had several bowel movements since then. No other significant findings on CT abdomen.  KUB obtained today which shows nonspecific findings?  Ileus but with improvement in stool burden. Will continue stool softeners but hold enema and mag citrate bottle..  3.  CAD/atypical chest pain: Concern for non-STEMI given abnormal EKG and elevated troponins.  Evaluated by cardiology and started on heparin drip although there seem to be leaning to with demand ischemia in the setting of initial hypotension and possible infection.  Last cardiac cath in 04/2018 did show patent stents and 90% obstructive disease in mid LCx-medical management was recommended at that time.  Resume antiplatelet agents/statins/low-dose Coreg as tolerated by blood pressure.  Hold Imdur in concern for hypotension, nitro as needed available.  Appreciate  cardiology input-continue heparin for 48 hours..  Will need Lexiscan prior to discharge based on echo results versus cath.  4.  Chronic systolic cardiomyopathy: Patient previously had EF of 35% but improved to 55% on last echo.  No signs of volume overload.  Appears dry. Hold ACE inhibitors/diuretics in concern for AKI.  Follow-up repeat echo  5. AKI on chronic kidney disease stage IIIa: Likely secondary to dehydration as well as urinary retention.  Improving with IV fluids and Foley catheter placement. Patient's baseline creatinine appears to be around 1.2 although in February 2021, patient did have isolated labs showing creatinine elevation upto 1.5.  Continue IV fluids  and hold diuretics/ACE inhibitors.  Patient had mild hypokalemia which has resolved now  6.  History of breast cancer: S/p chemoradiation.  Resume home regimen  7.  GERD: PPI   8. depression/anxiety: Resume home medications  9.  COPD: Stable.  Resume home medications   DVT prophylaxis: On heparin drip Code Status: Full code Family / Patient Communication: Discussed with patient Disposition Plan:   Status is: Inpatient  Remains inpatient appropriate because:IV treatments appropriate due to intensity of illness or inability to take PO   Dispo: The patient is from: Home              Anticipated d/c is to: Home              Anticipated d/c date is: 3 days              Patient currently is not medically stable to d/c.        LOS: 1 day    Time spent:     Guilford Shi, MD [facility] Pager in [place]  If 7PM-7AM, please contact night-coverage [email or web address] 10/08/2019, 5:35 PM

## 2019-10-08 NOTE — ED Notes (Signed)
Failed I/O cath secondary to patient tenderness/discomfort from previous attempts.

## 2019-10-08 NOTE — Progress Notes (Signed)
Progress Note  Patient Name: Krista Cisneros Date of Encounter: 10/08/2019  Primary Cardiologist: Ida Rogue, MD  Subjective   Trouble urinating overnight and staff unable to successfully pass a straight catheter.  She says that she dribbled some urine this AM, but still feels like bladder is not empty.  Also had very small BM.  Says abdomen feels a little better.  In all, feeling better than yesterday. No c/p or dyspnea.  Inpatient Medications    Scheduled Meds: . aspirin EC  81 mg Oral Daily  . calcium-vitamin D  1 tablet Oral Daily  . carvedilol  3.125 mg Oral BID  . cholecalciferol  1,000 Units Oral Daily  . clopidogrel  75 mg Oral Q breakfast  . DULoxetine  30 mg Oral BH-q7a  . letrozole  2.5 mg Oral Daily  . magnesium oxide  400 mg Oral Daily  . mometasone-formoterol  2 puff Inhalation BID  . montelukast  10 mg Oral QHS  . pantoprazole  40 mg Oral Daily  . rosuvastatin  20 mg Oral q1800  . sodium chloride flush  3 mL Intravenous Once   Continuous Infusions: . sodium chloride 75 mL/hr at 10/07/19 1957  . aztreonam Stopped (10/08/19 0027)  . heparin 750 Units/hr (10/07/19 1951)   PRN Meds: acetaminophen **OR** acetaminophen, albuterol, lidocaine-prilocaine, melatonin, nitroGLYCERIN, ondansetron **OR** ondansetron (ZOFRAN) IV, pentafluoroprop-tetrafluoroeth   Vital Signs    Vitals:   10/08/19 0400 10/08/19 0430 10/08/19 0500 10/08/19 0530  BP: (!) 105/48 (!) 93/49 (!) 92/48 (!) 113/53  Pulse: 73 75 74 76  Resp: 18 16 17 15   Temp:      TempSrc:      SpO2: 96% (!) 89% 93% 91%  Weight:      Height:        Intake/Output Summary (Last 24 hours) at 10/08/2019 0834 Last data filed at 10/08/2019 0215 Gross per 24 hour  Intake --  Output 225 ml  Net -225 ml   Filed Weights   10/07/19 1132  Weight: 63.5 kg    Physical Exam   GEN: Well nourished, well developed, in no acute distress.  HEENT: Grossly normal.  Neck: Supple, no JVD, carotid bruits, or  masses. Cardiac: RRR, no murmurs, rubs, or gallops. No clubbing, cyanosis, edema.  Radials/DP/PT 2+ and equal bilaterally.  Respiratory:  Respirations regular and unlabored, clear to auscultation bilaterally. GI: Soft, diffusely tender, nondistended, BS + x 4. MS: no deformity or atrophy. Skin: warm and dry, no rash. Neuro:  Strength and sensation are intact. Psych: AAOx3.  Normal affect.  Labs    Chemistry Recent Labs  Lab 10/07/19 1146 10/08/19 0623  NA 135 136  K 5.5* 4.3  CL 99 108  CO2 23 22  GLUCOSE 178* 107*  BUN 50* 41*  CREATININE 1.97* 1.41*  CALCIUM 8.9 7.5*  PROT 7.6  --   ALBUMIN 3.3*  --   AST 30  --   ALT 16  --   ALKPHOS 59  --   BILITOT 0.8  --   GFRNONAA 24* 36*  GFRAA 28* 41*  ANIONGAP 13 6     Hematology Recent Labs  Lab 10/07/19 1146 10/08/19 0623  WBC 24.2* 18.8*  RBC 5.55* 4.58  HGB 14.5 12.0  HCT 46.4* 38.2  MCV 83.6 83.4  MCH 26.1 26.2  MCHC 31.3 31.4  RDW 25.2* 24.8*  PLT 273 213    Cardiac Enzymes  Recent Labs  Lab 10/07/19 1146 10/07/19 1435  TROPONINIHS  562* 484*      Radiology    CT ABDOMEN PELVIS WO CONTRAST  Result Date: 10/07/2019 CLINICAL DATA:  Abdominal distension, nausea and vomiting. EXAM: CT ABDOMEN AND PELVIS WITHOUT CONTRAST TECHNIQUE: Multidetector CT imaging of the abdomen and pelvis was performed following the standard protocol without IV contrast. COMPARISON:  CT abdomen and pelvis 03/26/2017. FINDINGS: Lower chest: Extensive emphysematous disease is present in the lung bases. No pleural or pericardial effusion. Heart size is normal. Calcific coronary artery disease noted. Hepatobiliary: No focal liver abnormality is seen. No gallstones, gallbladder wall thickening, or biliary dilatation. Pancreas: Unremarkable. No pancreatic ductal dilatation or surrounding inflammatory changes. Spleen: Normal in size without focal abnormality. Adrenals/Urinary Tract: The adrenal glands appear normal. There is no  hydronephrosis on the right or left. Nonobstructing 0.3 cm stone in the left kidney is noted. The kidneys otherwise appear normal. Urinary bladder is distended. Stomach/Bowel: Stomach is within normal limits. Appendix appears normal. No evidence of bowel wall thickening, distention, or inflammatory changes. Large volume of stool in the rectosigmoid colon noted. Vascular/Lymphatic: No significant vascular findings are present. No enlarged abdominal or pelvic lymph nodes. Reproductive: Status post hysterectomy. No adnexal masses. Other: None. Musculoskeletal: No acute abnormality. Convex left lumbar scoliosis and degenerative disease at L4-5 are seen. IMPRESSION: No acute abnormality abdomen or pelvis. Distended urinary bladder noted. Large volume of stool in the rectosigmoid colon. Aortic Atherosclerosis (ICD10-I70.0) and Emphysema (ICD10-J43.9). Small nonobstructing stone left kidney. Electronically Signed   By: Inge Rise M.D.   On: 10/07/2019 15:18   DG Chest Portable 1 View  Result Date: 10/07/2019 CLINICAL DATA:  Weakness EXAM: PORTABLE CHEST 1 VIEW COMPARISON:  05/28/2019 FINDINGS: Cardiac shadow is within normal limits. Stable aortic calcifications are seen. Lungs are well aerated bilaterally. Diffuse emphysematous changes are seen with crowding of markings in the bases bilaterally. Left chest port is again seen and stable. No bony abnormality is noted. IMPRESSION: Changes of COPD without acute abnormality. Aortic Atherosclerosis (ICD10-I70.0) and Emphysema (ICD10-J43.9). Electronically Signed   By: Inez Catalina M.D.   On: 10/07/2019 14:37    Telemetry    RSR - Personally Reviewed  ECG    10/07/2019, 11:37 AM: Regular sinus rhythm, 81, inferior and anterolateral T wave inversion- Personally Reviewed  Cardiac Studies   2D Echocardiogram 5.6.2020  1. The left ventricle has normal systolic function, with an ejection  fraction of 50 to 55%. The cavity size was normal. Septal wall   hypokinesis. Grade I diastolic dysfunction.   2. The right ventricle has normal systolic function. The cavity was  normal. There is no increase in right ventricular wall thickness.Unable to  estimate RVSP.  _____________  *2D Echocardiogram 6.16.2021 pending*  Patient Profile     80 y.o. female female with a history of non-STEMI/CAD status post PCI to the LAD and staged PCI to the circumflex in December 2019, chronic HFrEF secondary to ischemic cardiomyopathy with improved EF (50-55% by echo May 2020), hypertension, hyperlipidemia, breast cancer status post chemoradiation, iron deficiency anemia, and GERD, who presented to the emergency department June 15 with constipation, abdominal pain, chest pain, and high-sensitivity troponin elevation (562  484).  Assessment & Plan    1.  Coronary artery disease/demand ischemia: Pt w/ prior h/o NSTEMI in 03/2018 w/ PCI/DES to the LAD and LCX.  Over the past few mos, she has noted occas c/p, generally occurring @ rest, assoc w/ dyspnea (though she says she is always short of breath), lasting just a few secs,  and resolving spontaneously.  She was recently placed on imdur, and hasn't noted much change in Ss.  Over the past week or so, she has had worsening abd discomfort and constipation.  Has also noted foul smelling urine in the mornings.  In the setting of abd discomfort, she has noted more frequent, brief episodes of chest discomfort.  She presented to the ED 6/15 2/2 abd discomfort.  HsT elevated @ 562 w/ f/u of 484.  ECG w/ new inferior and anterolateral TWI.  No c/p or dyspnea this AM.  Cont heparin x 48hrs.  Cont asa, statin,  blocker, plavix.  Echo pending.  If EF stable, will likely pursue lexiscan MV following clinical improvement related to sepsis - prior to d/c vs as outpt.  If EF down, will need to consider cath.  2.  Ischemic cardiomyopathy/heart failure with improved EF: EF previously 30 to 35% in the setting of non-STEMI in December 2019 with  subsequent improvement to 50-55% by echo in May 2020.  No CHF Ss recently and euvolemic on exam.  HR/BP stable.  Echo pending as above.  3.  Hyperlipidemia:  LDL of 51 by outside labs in 11/2018.  Cont statin rx.  4.  Acute kidney injury: In setting of sepsis/UTI w/ difficulty urinating.  Creat 1.97 on arrival, up from 1.5 in Feb.  She's 1.41 this AM after IV hydration.   5.  Sepsis/UTI/Leukocytosis:  Foul smelling urine x ~ 1 month, esp in the AM.  Difficulty passing urine over past few days - worse last night.  Staff unable to straight cath.  Urol consult pending.  Hemodynamically stable.  Abx per IM.  Signed, Murray Hodgkins, NP  10/08/2019, 8:34 AM    For questions or updates, please contact   Please consult [email or web address] for contact info under Cardiology/STEMI.

## 2019-10-08 NOTE — ED Notes (Signed)
Pt has had several bowel movements today with this RN. Pt has had to be cleaned 3 times with this RN on this shift. Stool is soft and brown.

## 2019-10-08 NOTE — Progress Notes (Signed)
    BRIEF OVERNIGHT PROGRESS REPORT   SUBJECTIVE: Per ED staff patient still unable to void despite bladder scan showing >999 cc of urine. Multiple attempts to straight cath without success. Attempted to insert Foley catheter but was unable to.  OBJECTIVE:On arrival to the bedside, she was afebrile with blood pressure 124/52 mm Hg and pulse rate 81 beats/min. There were no focal neurological deficits; she was alert and oriented x4, and very pleasant. Abdomen distended and tender to palpation. Bladder appears distended.    ASSESSMENT/PLAN:  Urinary retention with lower abdominal pain and distension - CT of the abdomen and pelvis showed significant amount of stool in the rectosigmoid colon with distended urinary bladder. - Multiple attempts to straight cath and insert Foley with no success, bladder scan shows>999 cc - Consult place to urology for assistance      Rufina Falco, DNP, CCRN, FNP-C [facility] Nurse Practitioner Between 7pm to 7am - Pager [phone]  After 7am go to [email or web address] - password:[email or web address]  [facility] [employer]  [phone]

## 2019-10-08 NOTE — Progress Notes (Signed)
[place] for heparin Indication: chest pain/ACS  Allergies  Allergen Reactions  . Nsaids     Bleeding risk  . Penicillins Anaphylaxis, Swelling and Rash    Has patient had a PCN reaction causing immediate rash, facial/tongue/throat swelling, SOB or lightheadedness with hypotension: Yes Has patient had a PCN reaction causing severe rash involving mucus membranes or skin necrosis: No Has patient had a PCN reaction that required hospitalization: No  Has patient had a PCN reaction occurring within the last 10 years: No If all of the above answers are "NO", then may proceed with Cephalosporin use.   . Atorvastatin Other (See Comments)    Muscle Pain  . Gabapentin Swelling  . Ezetimibe Other (See Comments)    Muscle pain  . Aspirin     GI bleeding risk    Patient Measurements: Height: 5\' 2"  (157.5 cm) Weight: 63.5 kg (140 lb) IBW/kg (Calculated) : 50.1 Heparin Dosing Weight: 63 kg  Vital Signs: BP: 105/46 (06/16 1559) Pulse Rate: 85 (06/16 1559)  Labs: Recent Labs    10/07/19 1146 10/07/19 1435 10/07/19 1957 10/08/19 0623 10/08/19 0729 10/08/19 1623  HGB 14.5  --   --  12.0  --   --   HCT 46.4*  --   --  38.2  --   --   PLT 273  --   --  213  --   --   APTT  --   --  37*  --   --   --   LABPROT  --   --  14.4  --   --   --   INR  --   --  1.2  --   --   --   HEPARINUNFRC  --   --   --   --  0.41 0.31  CREATININE 1.97*  --   --  1.41*  --   --   TROPONINIHS 562* 484*  --   --   --   --     Estimated Creatinine Clearance: 28.8 mL/min (A) (by C-G formula based on SCr of 1.41 mg/dL (H)).   Medical History: Past Medical History:  Diagnosis Date  . Anxiety   . Arthritis   . CAD (coronary artery disease)    a. NSTEMI 12/19; b. LHC 04/10/18: pLAD 95%, mLAD 80%, m-dLCx 95%, OM3-1 lesion 40%, OM3-2 lesion 60%, mRCA 50%, EF 35-45%, successful PCI/DES x 2 to the LAD with recommended staged PCI of the LCx in a few weeks  . Cancer  [facility])    Right Breast Cancer  . Chronic systolic CHF (congestive heart failure) ([place])    a. TTE 12/19: EF 30-35%, anteroseptal, anterior, and apical HK, Gr1DD, mild to mod MR, mildly dilated LA, RVSF nl  . COPD (chronic obstructive pulmonary disease) ([place])   . Depression   . Dyspnea    with exertion  . GERD (gastroesophageal reflux disease)   . Hyperlipidemia   . Hypertension   . Personal history of chemotherapy 2018   chemo prior to lumpectomy of right breast  . Personal history of radiation therapy 2019   right breast ca     Assessment: 79 year old female presented with weakness. Elevated troponin likely s/t supply-demand mismatch per Cardiology. No indication for urgent cath, but may consider ischemic evaluation pending resolution of acute medical problems. Pharmacy consulted for heparin drip. No anticoagulation PTA.   6/16 0729 HL 0.41 therapeutic x 1  Goal of Therapy:  Heparin level 0.3-0.7 units/ml Monitor platelets by anticoagulation protocol: Yes   Plan:  6/16 1623 HL 0.31 therapeutic. Level has trended down. Will slightly increase drip to 800 units/hr and check HL/CBC with morning labs.  Tawnya Crook, PharmD Clinical Pharmacist 10/08/2019 4:52 PM

## 2019-10-09 ENCOUNTER — Inpatient Hospital Stay (HOSPITAL_COMMUNITY)
Admit: 2019-10-09 | Discharge: 2019-10-09 | Disposition: A | Discharge status: Home / Self Care | Payer: Medicare Other | Attending: Cardiovascular Disease | Admitting: Cardiovascular Disease

## 2019-10-09 DIAGNOSIS — I248 Other forms of acute ischemic heart disease: Secondary | ICD-10-CM

## 2019-10-09 DIAGNOSIS — I2510 Atherosclerotic heart disease of native coronary artery without angina pectoris: Secondary | ICD-10-CM

## 2019-10-09 LAB — BASIC METABOLIC PANEL
Anion gap: 5 (ref 5–15)
BUN: 23 mg/dL (ref 8–23)
CO2: 24 mmol/L (ref 22–32)
Calcium: 7.8 mg/dL — ABNORMAL LOW (ref 8.9–10.3)
Chloride: 109 mmol/L (ref 98–111)
Creatinine, Ser: 1.25 mg/dL — ABNORMAL HIGH (ref 0.44–1.00)
GFR calc Af Amer: 48 mL/min — ABNORMAL LOW (ref >60)
GFR calc non Af Amer: 41 mL/min — ABNORMAL LOW (ref >60)
Glucose, Bld: 134 mg/dL — ABNORMAL HIGH (ref 70–99)
Potassium: 4 mmol/L (ref 3.5–5.1)
Sodium: 138 mmol/L (ref 135–145)

## 2019-10-09 LAB — ECHOCARDIOGRAM COMPLETE
Height: 62 in
Weight: 2440 oz

## 2019-10-09 LAB — CBC
HCT: 34.8 % — ABNORMAL LOW (ref 36.0–46.0)
Hemoglobin: 10.9 g/dL — ABNORMAL LOW (ref 12.0–15.0)
MCH: 26.7 pg (ref 26.0–34.0)
MCHC: 31.3 g/dL (ref 30.0–36.0)
MCV: 85.3 fL (ref 80.0–100.0)
Platelets: 189 10*3/uL (ref 150–400)
RBC: 4.08 MIL/uL (ref 3.87–5.11)
RDW: 24.4 % — ABNORMAL HIGH (ref 11.5–15.5)
WBC: 15.1 10*3/uL — ABNORMAL HIGH (ref 4.0–10.5)
nRBC: 0 % (ref 0.0–0.2)

## 2019-10-09 LAB — HEPARIN LEVEL (UNFRACTIONATED)
Heparin Unfractionated: 0.29 IU/mL — ABNORMAL LOW (ref 0.30–0.70)
Heparin Unfractionated: 0.38 IU/mL (ref 0.30–0.70)

## 2019-10-09 NOTE — Progress Notes (Signed)
[place] for heparin Indication: chest pain/ACS  Allergies  Allergen Reactions  . Nsaids     Bleeding risk  . Penicillins Anaphylaxis, Swelling and Rash    Has patient had a PCN reaction causing immediate rash, facial/tongue/throat swelling, SOB or lightheadedness with hypotension: Yes Has patient had a PCN reaction causing severe rash involving mucus membranes or skin necrosis: No Has patient had a PCN reaction that required hospitalization: No  Has patient had a PCN reaction occurring within the last 10 years: No If all of the above answers are "NO", then may proceed with Cephalosporin use.   . Atorvastatin Other (See Comments)    Muscle Pain  . Gabapentin Swelling  . Ezetimibe Other (See Comments)    Muscle pain  . Aspirin     GI bleeding risk    Patient Measurements: Height: 5\' 2"  (157.5 cm) Weight: 69.2 kg (152 lb 8 oz) IBW/kg (Calculated) : 50.1 Heparin Dosing Weight: 63 kg  Vital Signs: Temp: 97.6 F (36.4 C) (06/17 1322) Temp Source: Oral (06/17 0835) BP: 109/53 (06/17 1322) Pulse Rate: 79 (06/17 1322)  Labs: Recent Labs    10/07/19 1146 10/07/19 1146 10/07/19 1435 10/07/19 1957 10/08/19 0623 10/08/19 0729 10/08/19 1623 10/09/19 0520 10/09/19 1401  HGB 14.5   < >  --   --  12.0  --   --  10.9*  --   HCT 46.4*  --   --   --  38.2  --   --  34.8*  --   PLT 273  --   --   --  213  --   --  189  --   APTT  --   --   --  37*  --   --   --   --   --   LABPROT  --   --   --  14.4  --   --   --   --   --   INR  --   --   --  1.2  --   --   --   --   --   HEPARINUNFRC  --   --   --   --   --    < > 0.31 0.29* 0.38  CREATININE 1.97*  --   --   --  1.41*  --   --   --  1.25*  TROPONINIHS 562*  --  484*  --   --   --   --   --   --    < > = values in this interval not displayed.    Estimated Creatinine Clearance: 33.8 mL/min (A) (by C-G formula based on SCr of 1.25 mg/dL (H)).   Medical History: Past Medical History:   Diagnosis Date  . Anxiety   . Arthritis   . CAD (coronary artery disease)    a. NSTEMI 12/19; b. LHC 04/10/18: pLAD 95%, mLAD 80%, m-dLCx 95%, OM3-1 lesion 40%, OM3-2 lesion 60%, mRCA 50%, EF 35-45%, successful PCI/DES x 2 to the LAD with recommended staged PCI of the LCx in a few weeks  . Cancer [facility])    Right Breast Cancer  . Chronic systolic CHF (congestive heart failure) ([place])    a. TTE 12/19: EF 30-35%, anteroseptal, anterior, and apical HK, Gr1DD, mild to mod MR, mildly dilated LA, RVSF nl  . COPD (chronic obstructive pulmonary disease) ([place])   . Depression   . Dyspnea  with exertion  . GERD (gastroesophageal reflux disease)   . Hyperlipidemia   . Hypertension   . Personal history of chemotherapy 2018   chemo prior to lumpectomy of right breast  . Personal history of radiation therapy 2019   right breast ca     Assessment: 79 year old female presented with weakness. Elevated troponin likely s/t supply-demand mismatch per Cardiology. No indication for urgent cath, but may consider ischemic evaluation pending resolution of acute medical problems. Pharmacy consulted for heparin drip. No anticoagulation PTA.  Heparin course: 6/16 0729 HL 0.41 therapeutic x 1 6/17 0520 HL 0.29, rate increased to 850 units/hr 6/17 1401 HL 0.38, therapeutic x 1  Goal of Therapy:  Heparin level 0.3-0.7 units/ml Monitor platelets by anticoagulation protocol: Yes   Plan:  06/17 1400 HL 0.38 is therapeutic. Will continue with rate of 850 units/hr and will recheck HL at 2200. Daily CBC while on Heparin drip.  Paulina Fusi, PharmD, BCPS 10/09/2019 2:56 PM

## 2019-10-09 NOTE — Progress Notes (Signed)
*  PRELIMINARY RESULTS* Echocardiogram 2D Echocardiogram has been performed.  [name] 10/09/2019, 9:11 AM

## 2019-10-09 NOTE — Progress Notes (Addendum)
PROGRESS NOTE    Krista Cisneros  YWV:[id number]  DOB: 1940/06/01  PCP: Sharyne Peach, MD Admit date:10/07/2019 79 y.o. female with history h/o CAD, chronic systolic CHF with EF 85% (normalized to 55% in May 2020), COPD, right breast cancer s/p chemoradiation, depression/anxiety, hypertension, hyperlipidemia, GERD presented to the ED with complaints of feeling weak over the last couple of days.  Today she woke up feeling very tired and had no energy.  She also reports constipation as well as trouble urinating lately-she describes as having to strain to urinate.  She apparently was having intermittent chest discomfort a week back for which she sought medical attention with her primary cardiologist and was prescribed Imdur about 7 to 10 days back.  She denies any fever or chills.  She denies any chest pain but reports feeling sweaty this morning.  She denies any headaches, melena or hematochezia.  Her last good bowel movement was 3 weeks back.  She takes Aldactone daily but has not taken Lasix (prescribed for as needed use) for couple of months now. ED Course: Afebrile, Blood pressure 89/59-118/45, respiratory rate 22, pulse 63, temp 98.25F, O2 sat 96% on room air.  WBC 24.2, hemoglobin 14.5, platelet 273.  Sodium 135, potassium 5.5, chloride 99, bicarb 23, glucose 178, BUN 50, creatinine 1.97 (baseline appears to be around 1.2), calcium 8.9.  Magnesium 3.1, albumin 3.3, lipase 19.  LFTs within normal limits.  Troponin 562->484.  Lactate 2.1->0.9.  Chest x-ray unremarkable.  UA pending.  CT of the abdomen and pelvis showed significant amount of stool burden in rectosigmoid colon, distended urinary bladder, nonobstructing left nephrolithiasis.  Patient suspected to have sepsis, dehydration and started on IV fluids with broad-spectrum IV antibiotics.  Per ED nurse straight cath attempted but patient had just voided in her pants.  Urine sample not sent.  Postvoid residual being checked. Hospital course:  Patient admitted to [facility] for further evaluation and management.Patient had worsening urinary retention overnight and indwelling Foley catheter could not be placed.  Bladder scan revealed greater than 900 mL retention.  Seen by urology this morning and Foley placed.    Subjective:  Patient feels good today and asking if she could go home soon. Denies any chest pain. Tolerating Foley well  Objective: Vitals:   10/09/19 0600 10/09/19 0835 10/09/19 1322 10/09/19 1553  BP:  134/62 (!) 109/53 (!) 110/58  Pulse:  80 79 79  Resp: 17 16 16 16   Temp:  98.3 F (36.8 C) 97.6 F (36.4 C) 97.6 F (36.4 C)  TempSrc:  Oral    SpO2:  95% 98% 96%  Weight:      Height:        Intake/Output Summary (Last 24 hours) at 10/09/2019 1656 Last data filed at 10/09/2019 1552 Gross per 24 hour  Intake 1077.89 ml  Output 1850 ml  Net -772.11 ml   Filed Weights   10/07/19 1132 10/08/19 1739 10/09/19 0318  Weight: 63.5 kg 69.5 kg 69.2 kg    Physical Examination:  General exam: Appears calm and comfortable  Respiratory system: Clear to auscultation. Respiratory effort normal. Cardiovascular system: S1 & S2 heard, RRR. No JVD, murmurs, rubs, gallops or clicks. No pedal edema. Gastrointestinal system: Abdomen is nondistended, soft and nontender. Normal bowel sounds heard. Central nervous system: Alert and oriented. No new focal neurological deficits. Extremities: No contractures, edema or joint deformities.  Skin: No rashes, lesions or ulcers Psychiatry: Judgement and insight appear normal. Mood & affect appropriate.   Data  Reviewed: I have personally reviewed following labs and imaging studies  CBC: Recent Labs  Lab 10/07/19 1146 10/08/19 0623 10/09/19 0520  WBC 24.2* 18.8* 15.1*  HGB 14.5 12.0 10.9*  HCT 46.4* 38.2 34.8*  MCV 83.6 83.4 85.3  PLT 273 213 119   Basic Metabolic Panel: Recent Labs  Lab 10/07/19 1146 10/07/19 1435 10/08/19 0623 10/09/19 1401  NA 135  --  136 138  K 5.5*  --   4.3 4.0  CL 99  --  108 109  CO2 23  --  22 24  GLUCOSE 178*  --  107* 134*  BUN 50*  --  41* 23  CREATININE 1.97*  --  1.41* 1.25*  CALCIUM 8.9  --  7.5* 7.8*  MG  --  3.1*  --   --    GFR: Estimated Creatinine Clearance: 33.8 mL/min (A) (by C-G formula based on SCr of 1.25 mg/dL (H)). Liver Function Tests: Recent Labs  Lab 10/07/19 1146  AST 30  ALT 16  ALKPHOS 59  BILITOT 0.8  PROT 7.6  ALBUMIN 3.3*   Recent Labs  Lab 10/07/19 1146  LIPASE 19   No results for input(s): AMMONIA in the last 168 hours. Coagulation Profile: Recent Labs  Lab 10/07/19 1957  INR 1.2   Cardiac Enzymes: No results for input(s): CKTOTAL, CKMB, CKMBINDEX, TROPONINI in the last 168 hours. BNP (last 3 results) No results for input(s): PROBNP in the last 8760 hours. HbA1C: No results for input(s): HGBA1C in the last 72 hours. CBG: No results for input(s): GLUCAP in the last 168 hours. Lipid Profile: No results for input(s): CHOL, HDL, LDLCALC, TRIG, CHOLHDL, LDLDIRECT in the last 72 hours. Thyroid Function Tests: No results for input(s): TSH, T4TOTAL, FREET4, T3FREE, THYROIDAB in the last 72 hours. Anemia Panel: No results for input(s): VITAMINB12, FOLATE, FERRITIN, TIBC, IRON, RETICCTPCT in the last 72 hours. Sepsis Labs: Recent Labs  Lab 10/07/19 1436 10/07/19 1656  LATICACIDVEN 2.1* 0.9    Recent Results (from the past 240 hour(s))  Blood culture (routine x 2)     Status: None (Preliminary result)   Collection Time: 10/07/19  2:35 PM   Specimen: BLOOD  Result Value Ref Range Status   Specimen Description BLOOD LEFT ANTECUBITAL  Final   Special Requests   Final    BOTTLES DRAWN AEROBIC AND ANAEROBIC Blood Culture adequate volume   Culture   Final    NO GROWTH 2 DAYS Performed at [facility], [address] [place], [place] [address]    Report Status PENDING  Incomplete  Blood culture (routine x 2)     Status: None (Preliminary result)   Collection Time:  10/07/19  4:55 PM   Specimen: BLOOD  Result Value Ref Range Status   Specimen Description BLOOD PORTA CATH  Final   Special Requests   Final    BOTTLES DRAWN AEROBIC AND ANAEROBIC Blood Culture adequate volume   Culture   Final    NO GROWTH 2 DAYS Performed at [facility], [address], [place], [place] [address]    Report Status PENDING  Incomplete  SARS Coronavirus 2 by RT PCR (hospital order, performed in [place] hospital lab) Nasopharyngeal Nasopharyngeal Swab     Status: None   Collection Time: 10/07/19  4:56 PM   Specimen: Nasopharyngeal Swab  Result Value Ref Range Status   SARS Coronavirus 2 NEGATIVE NEGATIVE Final    Comment: (NOTE) SARS-CoV-2 target nucleic acids are NOT DETECTED.  The SARS-CoV-2 RNA is generally detectable in upper and lower respiratory specimens during the acute phase of infection. The lowest concentration of SARS-CoV-2 viral copies this assay can detect is 250 copies / mL. A negative result does not preclude SARS-CoV-2 infection and should not be used as the sole basis for treatment or other patient management decisions.  A negative result may occur with improper specimen collection / handling, submission of specimen other than nasopharyngeal swab, presence of viral mutation(s) within the areas targeted by this assay, and inadequate number of viral copies (<250 copies / mL). A negative result must be combined with clinical observations, patient history, and epidemiological information.  Fact Sheet for Patients:   StrictlyIdeas.no  Fact Sheet for Healthcare Providers: BankingDealers.co.za  This test is not yet approved or  cleared by the [place] FDA and has been authorized for detection and/or diagnosis of SARS-CoV-2 by FDA under an Emergency Use Authorization (EUA).  This EUA will remain in effect (meaning this test can be used) for the duration of the COVID-19 declaration under  Section 564(b)(1) of the Act, 21 U.S.C. section 360bbb-3(b)(1), unless the authorization is terminated or revoked sooner.  Performed at [facility], [address] [place], [place] [address]   Urine culture     Status: Abnormal (Preliminary result)   Collection Time: 10/08/19  2:08 AM   Specimen: Urine, Random  Result Value Ref Range Status   Specimen Description   Final    URINE, RANDOM Performed at [facility], [address], [place], [place] [address]    Special Requests   Final    NONE Performed at [facility], [address] [place], [place] [address]    Culture (A)  Final    70,000 COLONIES/mL ESCHERICHIA COLI SUSCEPTIBILITIES TO FOLLOW Performed at [facility], [place] [address], [place], [place] [address]    Report Status PENDING  Incomplete      Radiology Studies: DG Abd 1 View  Result Date: 10/08/2019 CLINICAL DATA:  Multiple bowel movements EXAM: ABDOMEN - 1 VIEW COMPARISON:  CT 10/07/2019 FINDINGS: Resolution of the large rectal stool ball seen on comparison CT from 1 day prior. Some persistent air-filled loops of clustered mid abdominal small bowel without frank distention are nonspecific. Only small to moderate residual colonic stool burden. Degenerative changes again noted in the spine hips and pelvis. No suspicious calcifications. Multiple phleboliths in the pelvis. Additional vascular calcium over the course of the aorta and iliac arteries. Remaining soft tissues are unremarkable. IMPRESSION: 1. Resolution of the large rectal stool ball seen on comparison CT from 1 day prior. 2. Some persistent air-filled loops of mid abdominal small bowel without frank distention are nonspecific. Could reflect ileus or early obstruction. Electronically Signed   By: Lovena Le M.D.   On: 10/08/2019 15:11        Scheduled Meds: . aspirin EC  81 mg Oral Daily  . calcium-vitamin D  1 tablet Oral Daily  . carvedilol  3.125 mg Oral BID  .  Chlorhexidine Gluconate Cloth  6 each Topical Daily  . cholecalciferol  1,000 Units Oral Daily  . clopidogrel  75 mg Oral Q breakfast  . DULoxetine  30 mg Oral [id number]  . letrozole  2.5 mg Oral Daily  . magnesium oxide  400 mg Oral Daily  . mometasone-formoterol  2 puff Inhalation BID  . montelukast  10 mg Oral QHS  . pantoprazole  40 mg Oral Daily  . rosuvastatin  20 mg Oral q1800  . sodium chloride flush  3 mL Intravenous Once   Continuous Infusions: . aztreonam 1 g (10/09/19 1558)  . heparin 850 Units/hr (10/09/19 9629)     Assessment/Plan:  1.  UTI with sepsis: Patient had hypotension, tachypnea, lactic acidosis on presentation with labs showing significant leukocytosis.  UA now shows significant pyuria (greater than 50) with bacteriuria, positive nitrite and large leukocyte esterase.  Agree with broad-spectrum antibiotics for now until urine cultures growing E coli-sensitivities pending.   Leukocytosis appears to be downtrending.    2. Abdominal pain: Secondary to stool burden/urinary retention.  Patient was able to void in the ED before straight cath yesterday but had recurrence of significant urinary retention overnight requiring urology intervention/Foley catheter insertion.  Appreciate urology input..  CT abdomen did show stool burden yesterday and laxatives were ordered but patient had several bowel movements since then. No other significant findings on CT abdomen.  KUB showed nonspecific findings?  Ileus but with improvement in stool burden-clinically abd soft. Will continue stool softeners but hold enema and mag citrate bottle..  3.  CAD/atypical chest pain: Concern for non-STEMI given abnormal EKG and elevated troponins.  Evaluated by cardiology and started on heparin drip although there seem to be leaning to with demand ischemia in the setting of initial hypotension and possible infection.  Last cardiac cath in 04/2018 did show patent stents and 90% obstructive disease in mid  LCx-medical management was recommended at that time.  Resume antiplatelet agents/statins/low-dose Coreg as tolerated by blood pressure.  Hold Imdur in concern for hypotension, nitro as needed available.  Appreciate cardiology input-continue heparin for 48 hours..  Will need Lexiscan prior to discharge based on echo results versus cath.  4.  Chronic systolic cardiomyopathy: Patient previously had EF of 35% but improved to 55% on last echo.  No signs of volume overload.  Appears dry. Hold ACE inhibitors/diuretics in concern for AKI.  Follow-up repeat echo  5. AKI on chronic kidney disease stage IIIa: Likely secondary to dehydration as well as urinary retention.  Improving with IV fluids and Foley catheter placement. Patient's baseline creatinine appears to be around 1.2 although in February 2021, patient did have isolated labs showing creatinine elevation upto 1.5.  Continue IV fluids and hold diuretics/ACE inhibitors.  Patient had mild hypokalemia which has resolved now  6.  History of breast cancer: S/p chemoradiation.  Resume home regimen  7.  GERD: PPI   8. Depression/anxiety: Resume home medications  9.  COPD: Stable.  Resume home medications   DVT prophylaxis: On heparin drip Code Status: Full code Family / Patient Communication: Discussed with patient Disposition Plan:   Status is: Inpatient  Remains inpatient appropriate because:IV treatments appropriate due to intensity of illness or inability to take PO   Dispo: The patient is from: Home              Anticipated d/c is to: Home              Anticipated d/c date is: 2 days              Patient currently is not medically stable to d/c.        LOS: 2 days    Time spent:     Guilford Shi, MD [facility] Pager in [place]  If 7PM-7AM, please contact night-coverage [email or web address] 10/09/2019, 4:56 PM

## 2019-10-09 NOTE — Progress Notes (Signed)
Progress Note  Patient Name: Krista Cisneros Date of Encounter: 10/09/2019  Primary Cardiologist: Ida Rogue, MD  Subjective   Reports that she feels somewhat better, less weak Less constipation, Has urinary catheter in place, making good urine 2 L urine past 24 hours Denies any chest pain or shortness of breath Discussed issues with her daughter that she lives with, in the hospital with lymphedema, cellulitis  Inpatient Medications    Scheduled Meds: . aspirin EC  81 mg Oral Daily  . calcium-vitamin D  1 tablet Oral Daily  . carvedilol  3.125 mg Oral BID  . Chlorhexidine Gluconate Cloth  6 each Topical Daily  . cholecalciferol  1,000 Units Oral Daily  . clopidogrel  75 mg Oral Q breakfast  . DULoxetine  30 mg Oral BH-q7a  . letrozole  2.5 mg Oral Daily  . magnesium oxide  400 mg Oral Daily  . mometasone-formoterol  2 puff Inhalation BID  . montelukast  10 mg Oral QHS  . pantoprazole  40 mg Oral Daily  . rosuvastatin  20 mg Oral q1800  . sodium chloride flush  3 mL Intravenous Once   Continuous Infusions: . aztreonam 1 g (10/09/19 0923)  . heparin 850 Units/hr (10/09/19 0616)   PRN Meds: acetaminophen **OR** acetaminophen, albuterol, lidocaine-prilocaine, melatonin, nitroGLYCERIN, ondansetron **OR** ondansetron (ZOFRAN) IV, pentafluoroprop-tetrafluoroeth   Vital Signs    Vitals:   10/09/19 0400 10/09/19 0500 10/09/19 0600 10/09/19 0835  BP:    134/62  Pulse:    80  Resp: 17 16 17 16   Temp:    98.3 F (36.8 C)  TempSrc:    Oral  SpO2:    95%  Weight:      Height:        Intake/Output Summary (Last 24 hours) at 10/09/2019 1301 Last data filed at 10/09/2019 1020 Gross per 24 hour  Intake 937.89 ml  Output 1100 ml  Net -162.11 ml   Filed Weights   10/07/19 1132 10/08/19 1739 10/09/19 0318  Weight: 63.5 kg 69.5 kg 69.2 kg    Physical Exam   Constitutional:  oriented to person, place, and time. No distress.  HENT:  Head: Grossly normal Eyes:   no discharge. No scleral icterus.  Neck: No JVD, no carotid bruits  Cardiovascular: Regular rate and rhythm, no murmurs appreciated Pulmonary/Chest: Clear to auscultation bilaterally, no wheezes or rails Abdominal: Soft.  no distension.  no tenderness.  Musculoskeletal: Normal range of motion Neurological:  normal muscle tone. Coordination normal. No atrophy Skin: Skin warm and dry Psychiatric: normal affect, pleasant   Labs    Chemistry Recent Labs  Lab 10/07/19 1146 10/08/19 0623  NA 135 136  K 5.5* 4.3  CL 99 108  CO2 23 22  GLUCOSE 178* 107*  BUN 50* 41*  CREATININE 1.97* 1.41*  CALCIUM 8.9 7.5*  PROT 7.6  --   ALBUMIN 3.3*  --   AST 30  --   ALT 16  --   ALKPHOS 59  --   BILITOT 0.8  --   GFRNONAA 24* 36*  GFRAA 28* 41*  ANIONGAP 13 6     Hematology Recent Labs  Lab 10/07/19 1146 10/08/19 0623 10/09/19 0520  WBC 24.2* 18.8* 15.1*  RBC 5.55* 4.58 4.08  HGB 14.5 12.0 10.9*  HCT 46.4* 38.2 34.8*  MCV 83.6 83.4 85.3  MCH 26.1 26.2 26.7  MCHC 31.3 31.4 31.3  RDW 25.2* 24.8* 24.4*  PLT 273 213 189    Cardiac  Enzymes  Recent Labs  Lab 10/07/19 1146 10/07/19 1435  TROPONINIHS 562* 484*      Radiology    CT ABDOMEN PELVIS WO CONTRAST  Result Date: 10/07/2019 CLINICAL DATA:  Abdominal distension, nausea and vomiting. EXAM: CT ABDOMEN AND PELVIS WITHOUT CONTRAST TECHNIQUE: Multidetector CT imaging of the abdomen and pelvis was performed following the standard protocol without IV contrast. COMPARISON:  CT abdomen and pelvis 03/26/2017. FINDINGS: Lower chest: Extensive emphysematous disease is present in the lung bases. No pleural or pericardial effusion. Heart size is normal. Calcific coronary artery disease noted. Hepatobiliary: No focal liver abnormality is seen. No gallstones, gallbladder wall thickening, or biliary dilatation. Pancreas: Unremarkable. No pancreatic ductal dilatation or surrounding inflammatory changes. Spleen: Normal in size without  focal abnormality. Adrenals/Urinary Tract: The adrenal glands appear normal. There is no hydronephrosis on the right or left. Nonobstructing 0.3 cm stone in the left kidney is noted. The kidneys otherwise appear normal. Urinary bladder is distended. Stomach/Bowel: Stomach is within normal limits. Appendix appears normal. No evidence of bowel wall thickening, distention, or inflammatory changes. Large volume of stool in the rectosigmoid colon noted. Vascular/Lymphatic: No significant vascular findings are present. No enlarged abdominal or pelvic lymph nodes. Reproductive: Status post hysterectomy. No adnexal masses. Other: None. Musculoskeletal: No acute abnormality. Convex left lumbar scoliosis and degenerative disease at L4-5 are seen. IMPRESSION: No acute abnormality abdomen or pelvis. Distended urinary bladder noted. Large volume of stool in the rectosigmoid colon. Aortic Atherosclerosis (ICD10-I70.0) and Emphysema (ICD10-J43.9). Small nonobstructing stone left kidney. Electronically Signed   By: Inge Rise M.D.   On: 10/07/2019 15:18   DG Abd 1 View  Result Date: 10/08/2019 CLINICAL DATA:  Multiple bowel movements EXAM: ABDOMEN - 1 VIEW COMPARISON:  CT 10/07/2019 FINDINGS: Resolution of the large rectal stool ball seen on comparison CT from 1 day prior. Some persistent air-filled loops of clustered mid abdominal small bowel without frank distention are nonspecific. Only small to moderate residual colonic stool burden. Degenerative changes again noted in the spine hips and pelvis. No suspicious calcifications. Multiple phleboliths in the pelvis. Additional vascular calcium over the course of the aorta and iliac arteries. Remaining soft tissues are unremarkable. IMPRESSION: 1. Resolution of the large rectal stool ball seen on comparison CT from 1 day prior. 2. Some persistent air-filled loops of mid abdominal small bowel without frank distention are nonspecific. Could reflect ileus or early obstruction.  Electronically Signed   By: Lovena Le M.D.   On: 10/08/2019 15:11   DG Chest Portable 1 View  Result Date: 10/07/2019 CLINICAL DATA:  Weakness EXAM: PORTABLE CHEST 1 VIEW COMPARISON:  05/28/2019 FINDINGS: Cardiac shadow is within normal limits. Stable aortic calcifications are seen. Lungs are well aerated bilaterally. Diffuse emphysematous changes are seen with crowding of markings in the bases bilaterally. Left chest port is again seen and stable. No bony abnormality is noted. IMPRESSION: Changes of COPD without acute abnormality. Aortic Atherosclerosis (ICD10-I70.0) and Emphysema (ICD10-J43.9). Electronically Signed   By: Inez Catalina M.D.   On: 10/07/2019 14:37    Telemetry    RSR - Personally Reviewed  ECG    10/07/2019, 11:37 AM: Regular sinus rhythm, 81, inferior and anterolateral T wave inversion- Personally Reviewed  Cardiac Studies   2D Echocardiogram 5.6.2020  1. The left ventricle has normal systolic function, with an ejection  fraction of 50 to 55%. The cavity size was normal. Septal wall  hypokinesis. Grade I diastolic dysfunction.   2. The right ventricle has normal systolic  function. The cavity was  normal. There is no increase in right ventricular wall thickness.Unable to  estimate RVSP.  _____________  *2D Echocardiogram 6.16.2021 pending*  Patient Profile     79 y.o. female female with a history of non-STEMI/CAD status post PCI to the LAD and staged PCI to the circumflex in December 2019, chronic HFrEF secondary to ischemic cardiomyopathy with improved EF (50-55% by echo May 2020), hypertension, hyperlipidemia, breast cancer status post chemoradiation, iron deficiency anemia, and GERD, who presented to the emergency department June 15 with constipation, abdominal pain, chest pain, and high-sensitivity troponin elevation (562  484).  Assessment & Plan    1.  Coronary artery disease/demand ischemia:   h/o NSTEMI in 03/2018 w/ PCI/DES to the LAD and LCX.   ---  Elevated troponin this admission in the setting of significant constipation, urinary stress, sepsis ----Would likely be best served by [facility] tomorrow morning -Echocardiogram is pending Discussed with her, she is in agreement  2.  Ischemic cardiomyopathy/heart failure with improved EF:  EF previously 30 to 35% in the setting of non-STEMI in December 2019 with subsequent improvement to 50-55% by echo in May 2020.   -Repeat echo pending  3.  Hyperlipidemia:   LDL of 51 by outside labs in 11/2018.   Continue statin  4.  Acute kidney injury:  In setting of sepsis/UTI w/ difficulty urinating.   Elevated creatinine on arrival in the setting of urinary retention, likely ATN, now improved  5.  Sepsis/UTI/Leukocytosis:   Foul smelling urine x ~ 1 month, esp in the AM.  Urine retention, very difficult to place cath --Outpatient follow-up with urology   Total encounter time more than 25 minutes  Greater than 50% was spent in counseling and coordination of care with the patient   Signed, Ida Rogue, MD  10/09/2019, 1:01 PM    For questions or updates, please contact   Please consult [email or web address] for contact info under Cardiology/STEMI.

## 2019-10-09 NOTE — TOC Initial Note (Signed)
Transition of Care [facility]) - Initial/Assessment Note    Patient Details  Name: Krista Cisneros MRN: [id number] Date of Birth: [birth year]  Transition of Care [facility]) CM/SW Contact:    Victorino Dike, RN Phone Number: 10/09/2019, 10:22 AM  Clinical Narrative:      High Risk Readmission Risk Assessment Completed.  Patient explained she lives with her daughter and son in law.  Her PCP is Gordy Clement, MD.  Her family assists her with medicines, groceries, and meals.  She is independent with all tasks and denies use of any DME.      Recent change in home, daughter has been in this hospital the last few days and not present in home.                Expected Discharge Plan: Home/Self Care Barriers to Discharge: Continued Medical Work up   Patient Goals and CMS Choice Patient states their goals for this hospitalization and ongoing recovery are:: To return home with daughter and son in law CMS Medicare.gov Compare Post Acute Care list provided to:: Patient Choice offered to / list presented to : Patient  Expected Discharge Plan and Services Expected Discharge Plan: Home/Self Care       Living arrangements for the past 2 months: Single Family Home                                      Prior Living Arrangements/Services Living arrangements for the past 2 months: Single Family Home Lives with:: Adult Children, Self   Do you feel safe going back to the place where you live?: Yes      Need for Family Participation in Patient Care: Yes (Comment) Care giver support system in place?: Yes (comment)   Criminal Activity/Legal Involvement Pertinent to Current Situation/Hospitalization: No - Comment as needed  Activities of Daily Living Home Assistive Devices/Equipment: Eyeglasses, Dentures (specify type) ADL Screening (condition at time of admission) Patient's cognitive ability adequate to safely complete daily activities?: Yes Is the patient deaf or have difficulty hearing?:  No Does the patient have difficulty seeing, even when wearing glasses/contacts?: No Does the patient have difficulty concentrating, remembering, or making decisions?: No Patient able to express need for assistance with ADLs?: Yes Does the patient have difficulty dressing or bathing?: No Independently performs ADLs?: Yes (appropriate for developmental age) Does the patient have difficulty walking or climbing stairs?: No Weakness of Legs: Both Weakness of Arms/Hands: None  Permission Sought/Granted                  Emotional Assessment Appearance:: Appears stated age Attitude/Demeanor/Rapport: Engaged Affect (typically observed): Accepting, Appropriate Orientation: : Oriented to Self, Oriented to Place, Oriented to Situation, Oriented to  Time Alcohol / Substance Use: Not Applicable Psych Involvement: No (comment)  Admission diagnosis:  Slow transit constipation [K59.01] Dehydration [E86.0] Weakness [R53.1] SIRS (systemic inflammatory response syndrome) (HCC) [R65.10] AKI (acute kidney injury) ([place]) [N17.9] Constipation [K59.00] Sepsis without acute organ dysfunction, due to unspecified organism [facility]) [A41.9] Patient Active Problem List   Diagnosis Date Noted  . SIRS (systemic inflammatory response syndrome) ([place]) 10/07/2019  . Constipation 10/07/2019  . Urinary retention 10/07/2019  . Iron deficiency anemia 05/30/2019  . Ischemic cardiomyopathy 06/03/2018  . Coronary artery disease involving native coronary artery of native heart with angina pectoris ([place]) 05/14/2018  . Chronic systolic heart failure ([place])   . Coronary artery disease involving native  coronary artery of native heart without angina pectoris 04/22/2018  . Non-STEMI (non-ST elevated myocardial infarction) ([place]) 04/22/2018  . NSTEMI (non-ST elevated myocardial infarction) ([place]) 04/09/2018  . Malignant neoplasm of upper-outer quadrant of right breast in female, estrogen receptor negative ([place]) 06/19/2017  .  Incarcerated hernia 03/26/2017  . Incarcerated inguinal hernia   . Acute kidney injury ([place]) 02/07/2017  . Hypomagnesemia 01/30/2017  . Goals of care, counseling/discussion 10/20/2016  . Malignant neoplasm of right female breast ([place]) 10/16/2016  . Hyperlipidemia, mixed 10/08/2016  . Chronic venous insufficiency 09/14/2016  . GERD (gastroesophageal reflux disease) 09/14/2016  . Pain in limb 05/29/2016  . Essential hypertension 05/29/2016  . COPD (chronic obstructive pulmonary disease) ([place]) 05/29/2016  . Hardening of the aorta (main artery of the heart) ([place]) 01/07/2016  . Hyperglycemia, unspecified 01/07/2016  . Neuropathy 11/15/2015  . Severe recurrent major depression without psychotic features ([place]) 09/10/2015  . Allergic rhinitis 04/10/2014  . Depression 04/10/2014  . Obesity, unspecified 04/10/2014   PCP:  Sharyne Peach, MD Pharmacy:   [id number] [place], [place] [address] [place] [address] Phone: [phone] Fax: [phone]     Social Determinants of Health (SDOH) Interventions    Readmission Risk Interventions Readmission Risk Prevention Plan 10/09/2019  Transportation Screening Complete  Medication Review (RN Care Manager) Complete  Gap Not Applicable  Some recent data might be hidden

## 2019-10-09 NOTE — Progress Notes (Signed)
[place] for heparin Indication: chest pain/ACS  Allergies  Allergen Reactions  . Nsaids     Bleeding risk  . Penicillins Anaphylaxis, Swelling and Rash    Has patient had a PCN reaction causing immediate rash, facial/tongue/throat swelling, SOB or lightheadedness with hypotension: Yes Has patient had a PCN reaction causing severe rash involving mucus membranes or skin necrosis: No Has patient had a PCN reaction that required hospitalization: No  Has patient had a PCN reaction occurring within the last 10 years: No If all of the above answers are "NO", then may proceed with Cephalosporin use.   . Atorvastatin Other (See Comments)    Muscle Pain  . Gabapentin Swelling  . Ezetimibe Other (See Comments)    Muscle pain  . Aspirin     GI bleeding risk    Patient Measurements: Height: 5\' 2"  (157.5 cm) Weight: 69.2 kg (152 lb 8 oz) IBW/kg (Calculated) : 50.1 Heparin Dosing Weight: 63 kg  Vital Signs: Temp: 98 F (36.7 C) (06/17 0318) Temp Source: Oral (06/17 0318) BP: 127/65 (06/17 0318) Pulse Rate: 80 (06/17 0318)  Labs: Recent Labs    10/07/19 1146 10/07/19 1146 10/07/19 1435 10/07/19 1957 10/08/19 0623 10/08/19 0729 10/08/19 1623 10/09/19 0520  HGB 14.5   < >  --   --  12.0  --   --  10.9*  HCT 46.4*  --   --   --  38.2  --   --  34.8*  PLT 273  --   --   --  213  --   --  189  APTT  --   --   --  37*  --   --   --   --   LABPROT  --   --   --  14.4  --   --   --   --   INR  --   --   --  1.2  --   --   --   --   HEPARINUNFRC  --   --   --   --   --  0.41 0.31 0.29*  CREATININE 1.97*  --   --   --  1.41*  --   --   --   TROPONINIHS 562*  --  484*  --   --   --   --   --    < > = values in this interval not displayed.    Estimated Creatinine Clearance: 30 mL/min (A) (by C-G formula based on SCr of 1.41 mg/dL (H)).   Medical History: Past Medical History:  Diagnosis Date  . Anxiety   . Arthritis   . CAD (coronary  artery disease)    a. NSTEMI 12/19; b. LHC 04/10/18: pLAD 95%, mLAD 80%, m-dLCx 95%, OM3-1 lesion 40%, OM3-2 lesion 60%, mRCA 50%, EF 35-45%, successful PCI/DES x 2 to the LAD with recommended staged PCI of the LCx in a few weeks  . Cancer [facility])    Right Breast Cancer  . Chronic systolic CHF (congestive heart failure) ([place])    a. TTE 12/19: EF 30-35%, anteroseptal, anterior, and apical HK, Gr1DD, mild to mod MR, mildly dilated LA, RVSF nl  . COPD (chronic obstructive pulmonary disease) ([place])   . Depression   . Dyspnea    with exertion  . GERD (gastroesophageal reflux disease)   . Hyperlipidemia   . Hypertension   . Personal history of chemotherapy 2018   chemo prior  to lumpectomy of right breast  . Personal history of radiation therapy 2019   right breast ca     Assessment: 79 year old female presented with weakness. Elevated troponin likely s/t supply-demand mismatch per Cardiology. No indication for urgent cath, but may consider ischemic evaluation pending resolution of acute medical problems. Pharmacy consulted for heparin drip. No anticoagulation PTA.   6/16 0729 HL 0.41 therapeutic x 1  Goal of Therapy:  Heparin level 0.3-0.7 units/ml Monitor platelets by anticoagulation protocol: Yes   Plan:  06/17 @ 0500 HL 0.29 barely subtherapeutic. Will increase rate slightly to 850 units/hr and will recheck HL at 1400, CBC hgb trended down 1 g/dL will continue to monitor.  Tobie Lords, PharmD Clinical Pharmacist 10/09/2019 5:55 AM

## 2019-10-10 ENCOUNTER — Inpatient Hospital Stay (HOSPITAL_COMMUNITY): Payer: Medicare Other

## 2019-10-10 DIAGNOSIS — I25119 Atherosclerotic heart disease of native coronary artery with unspecified angina pectoris: Secondary | ICD-10-CM

## 2019-10-10 DIAGNOSIS — R778 Other specified abnormalities of plasma proteins: Secondary | ICD-10-CM

## 2019-10-10 DIAGNOSIS — N390 Urinary tract infection, site not specified: Secondary | ICD-10-CM

## 2019-10-10 DIAGNOSIS — I2510 Atherosclerotic heart disease of native coronary artery without angina pectoris: Secondary | ICD-10-CM

## 2019-10-10 DIAGNOSIS — E782 Mixed hyperlipidemia: Secondary | ICD-10-CM

## 2019-10-10 DIAGNOSIS — A4151 Sepsis due to Escherichia coli [E. coli]: Secondary | ICD-10-CM

## 2019-10-10 DIAGNOSIS — K5900 Constipation, unspecified: Secondary | ICD-10-CM

## 2019-10-10 DIAGNOSIS — C50411 Malignant neoplasm of upper-outer quadrant of right female breast: Secondary | ICD-10-CM

## 2019-10-10 DIAGNOSIS — Z171 Estrogen receptor negative status [ER-]: Secondary | ICD-10-CM

## 2019-10-10 LAB — NM MYOCAR MULTI W/SPECT W/WALL MOTION / EF
Estimated workload: 1 METS
Exercise duration (min): 0 min
Exercise duration (sec): 0 s
LV dias vol: 88 mL (ref 46–106)
LV sys vol: 43 mL
MPHR: 142 {beats}/min
Peak HR: 92 {beats}/min
Percent HR: 64 %
Rest HR: 68 {beats}/min
SDS: 0
SRS: 0
SSS: 0
TID: 1

## 2019-10-10 LAB — BASIC METABOLIC PANEL
Anion gap: 5 (ref 5–15)
BUN: 20 mg/dL (ref 8–23)
CO2: 25 mmol/L (ref 22–32)
Calcium: 7.9 mg/dL — ABNORMAL LOW (ref 8.9–10.3)
Chloride: 108 mmol/L (ref 98–111)
Creatinine, Ser: 1.1 mg/dL — ABNORMAL HIGH (ref 0.44–1.00)
GFR calc Af Amer: 56 mL/min — ABNORMAL LOW (ref >60)
GFR calc non Af Amer: 48 mL/min — ABNORMAL LOW (ref >60)
Glucose, Bld: 108 mg/dL — ABNORMAL HIGH (ref 70–99)
Potassium: 4 mmol/L (ref 3.5–5.1)
Sodium: 138 mmol/L (ref 135–145)

## 2019-10-10 LAB — CBC
HCT: 36.8 % (ref 36.0–46.0)
Hemoglobin: 11.3 g/dL — ABNORMAL LOW (ref 12.0–15.0)
MCH: 26.3 pg (ref 26.0–34.0)
MCHC: 30.7 g/dL (ref 30.0–36.0)
MCV: 85.8 fL (ref 80.0–100.0)
Platelets: 211 10*3/uL (ref 150–400)
RBC: 4.29 MIL/uL (ref 3.87–5.11)
RDW: 23.8 % — ABNORMAL HIGH (ref 11.5–15.5)
WBC: 11.6 10*3/uL — ABNORMAL HIGH (ref 4.0–10.5)
nRBC: 0 % (ref 0.0–0.2)

## 2019-10-10 LAB — HEPARIN LEVEL (UNFRACTIONATED)
Heparin Unfractionated: 0.34 IU/mL (ref 0.30–0.70)
Heparin Unfractionated: 0.36 IU/mL (ref 0.30–0.70)

## 2019-10-10 LAB — URINE CULTURE: Culture: 70000 — AB

## 2019-10-10 MED ORDER — HEPARIN SOD (PORK) LOCK FLUSH 100 UNIT/ML IV SOLN
500.0000 [IU] | Freq: Once | INTRAVENOUS | Status: AC
  Administered 2019-10-10: 500 [IU] via INTRAVENOUS
  Filled 2019-10-10: qty 5

## 2019-10-10 MED ORDER — REGADENOSON 0.4 MG/5ML IV SOLN
0.4000 mg | Freq: Once | INTRAVENOUS | Status: AC
  Administered 2019-10-10: 0.4 mg via INTRAVENOUS
  Filled 2019-10-10: qty 5

## 2019-10-10 MED ORDER — CARVEDILOL 3.125 MG PO TABS: 6.2500 mg | ORAL_TABLET | Freq: Two times a day (BID) | ORAL | 1 refills | Status: DC

## 2019-10-10 MED ORDER — TECHNETIUM TC 99M TETROFOSMIN IV KIT
10.0000 | PACK | Freq: Once | INTRAVENOUS | Status: AC | PRN
  Administered 2019-10-10: 9.91 via INTRAVENOUS

## 2019-10-10 MED ORDER — CIPROFLOXACIN HCL 500 MG PO TABS
250.0000 mg | ORAL_TABLET | Freq: Two times a day (BID) | ORAL | Status: DC
  Filled 2019-10-10 (×2): qty 0.5

## 2019-10-10 MED ORDER — CIPROFLOXACIN HCL 250 MG PO TABS: 250.0000 mg | ORAL_TABLET | Freq: Two times a day (BID) | ORAL | 0 refills | Status: AC

## 2019-10-10 MED ORDER — TECHNETIUM TC 99M TETROFOSMIN IV KIT
30.0000 | PACK | Freq: Once | INTRAVENOUS | Status: AC | PRN
  Administered 2019-10-10: 31.793 via INTRAVENOUS

## 2019-10-10 NOTE — Progress Notes (Signed)
[place] for heparin Indication: chest pain/ACS  Allergies  Allergen Reactions  . Nsaids     Bleeding risk  . Penicillins Anaphylaxis, Swelling and Rash    Has patient had a PCN reaction causing immediate rash, facial/tongue/throat swelling, SOB or lightheadedness with hypotension: Yes Has patient had a PCN reaction causing severe rash involving mucus membranes or skin necrosis: No Has patient had a PCN reaction that required hospitalization: No  Has patient had a PCN reaction occurring within the last 10 years: No If all of the above answers are "NO", then may proceed with Cephalosporin use.   . Atorvastatin Other (See Comments)    Muscle Pain  . Gabapentin Swelling  . Ezetimibe Other (See Comments)    Muscle pain  . Aspirin     GI bleeding risk    Patient Measurements: Height: 5\' 2"  (157.5 cm) Weight: 69.2 kg (152 lb 8 oz) IBW/kg (Calculated) : 50.1 Heparin Dosing Weight: 63 kg  Vital Signs: Temp: 98.7 F (37.1 C) (06/17 1928) Temp Source: Oral (06/17 1928) BP: 120/60 (06/17 1928) Pulse Rate: 90 (06/17 1928)  Labs: Recent Labs    10/07/19 1146 10/07/19 1146 10/07/19 1435 10/07/19 1957 10/08/19 0623 10/08/19 0729 10/09/19 0520 10/09/19 1401 10/10/19 0047  HGB 14.5   < >  --   --  12.0  --  10.9*  --   --   HCT 46.4*  --   --   --  38.2  --  34.8*  --   --   PLT 273  --   --   --  213  --  189  --   --   APTT  --   --   --  37*  --   --   --   --   --   LABPROT  --   --   --  14.4  --   --   --   --   --   INR  --   --   --  1.2  --   --   --   --   --   HEPARINUNFRC  --   --   --   --   --    < > 0.29* 0.38 0.34  CREATININE 1.97*  --   --   --  1.41*  --   --  1.25*  --   TROPONINIHS 562*  --  484*  --   --   --   --   --   --    < > = values in this interval not displayed.    Estimated Creatinine Clearance: 33.8 mL/min (A) (by C-G formula based on SCr of 1.25 mg/dL (H)).   Medical History: Past Medical History:   Diagnosis Date  . Anxiety   . Arthritis   . CAD (coronary artery disease)    a. NSTEMI 12/19; b. LHC 04/10/18: pLAD 95%, mLAD 80%, m-dLCx 95%, OM3-1 lesion 40%, OM3-2 lesion 60%, mRCA 50%, EF 35-45%, successful PCI/DES x 2 to the LAD with recommended staged PCI of the LCx in a few weeks  . Cancer [facility])    Right Breast Cancer  . Chronic systolic CHF (congestive heart failure) ([place])    a. TTE 12/19: EF 30-35%, anteroseptal, anterior, and apical HK, Gr1DD, mild to mod MR, mildly dilated LA, RVSF nl  . COPD (chronic obstructive pulmonary disease) ([place])   . Depression   . Dyspnea  with exertion  . GERD (gastroesophageal reflux disease)   . Hyperlipidemia   . Hypertension   . Personal history of chemotherapy 2018   chemo prior to lumpectomy of right breast  . Personal history of radiation therapy 2019   right breast ca     Assessment: 79 year old female presented with weakness. Elevated troponin likely s/t supply-demand mismatch per Cardiology. No indication for urgent cath, but may consider ischemic evaluation pending resolution of acute medical problems. Pharmacy consulted for heparin drip. No anticoagulation PTA.   6/16 0729 HL 0.41 therapeutic x 1  Goal of Therapy:  Heparin level 0.3-0.7 units/ml Monitor platelets by anticoagulation protocol: Yes   Plan:  06/18 @ 0047 HL 0.34 therapeutic. Will continue current rate and will recheck HL at 0900, CBC trending down will continue to monitor.   Tobie Lords, PharmD Clinical Pharmacist 10/10/2019 1:51 AM

## 2019-10-10 NOTE — Discharge Planning (Signed)
Patient IV and tele removed.  Port heparin flushed per protocol and de-accessed.  Foley changed to leg bag and educated on home care, hygiene and how to take care of it.  Discharge papers given, explained and educated.  Informed of suggested FU appts and appts set.  Scripts sent to pharm per patient's choice.  Wheeled to front and family transporting home via car.

## 2019-10-10 NOTE — Progress Notes (Signed)
Progress Note  Patient Name: Krista Cisneros Date of Encounter: 10/10/2019  Primary Cardiologist: Ida Rogue, MD  Subjective   Upset this morning, would like to go home soon as possible She is hungry, feels her body hurts sleeping in the bed Denies any constipation issues, abdominal discomfort, no chest pain or shortness of breath  Inpatient Medications    Scheduled Meds: . aspirin EC  81 mg Oral Daily  . calcium-vitamin D  1 tablet Oral Daily  . carvedilol  3.125 mg Oral BID  . Chlorhexidine Gluconate Cloth  6 each Topical Daily  . cholecalciferol  1,000 Units Oral Daily  . ciprofloxacin  250 mg Oral BID  . clopidogrel  75 mg Oral Q breakfast  . DULoxetine  30 mg Oral BH-q7a  . letrozole  2.5 mg Oral Daily  . magnesium oxide  400 mg Oral Daily  . mometasone-formoterol  2 puff Inhalation BID  . montelukast  10 mg Oral QHS  . pantoprazole  40 mg Oral Daily  . rosuvastatin  20 mg Oral q1800  . sodium chloride flush  3 mL Intravenous Once   Continuous Infusions:  PRN Meds: acetaminophen **OR** acetaminophen, albuterol, lidocaine-prilocaine, melatonin, nitroGLYCERIN, ondansetron **OR** ondansetron (ZOFRAN) IV, pentafluoroprop-tetrafluoroeth   Vital Signs    Vitals:   10/09/19 1553 10/09/19 1928 10/10/19 0601 10/10/19 0750  BP: (!) 110/58 120/60 136/71 125/84  Pulse: 79 90 75 77  Resp: 16 17 17 17   Temp: 97.6 F (36.4 C) 98.7 F (37.1 C) 98 F (36.7 C) 97.6 F (36.4 C)  TempSrc:  Oral  Oral  SpO2: 96% 99% 98% 99%  Weight:   70.3 kg   Height:        Intake/Output Summary (Last 24 hours) at 10/10/2019 1256 Last data filed at 10/10/2019 0600 Gross per 24 hour  Intake 654.57 ml  Output 2375 ml  Net -1720.43 ml   Filed Weights   10/08/19 1739 10/09/19 0318 10/10/19 0601  Weight: 69.5 kg 69.2 kg 70.3 kg    Physical Exam   Constitutional:  oriented to person, place, and time. No distress.  HENT:  Head: Grossly normal Eyes:  no discharge. No scleral  icterus.  Neck: No JVD, no carotid bruits  Cardiovascular: Regular rate and rhythm, no murmurs appreciated Pulmonary/Chest: Clear to auscultation bilaterally, no wheezes or rails Abdominal: Soft.  no distension.  no tenderness.  Musculoskeletal: Normal range of motion Neurological:  normal muscle tone. Coordination normal. No atrophy Skin: Skin warm and dry Psychiatric: normal affect, pleasant   Labs    Chemistry Recent Labs  Lab 10/07/19 1146 10/07/19 1146 10/08/19 0623 10/09/19 1401 10/10/19 0738  NA 135   < > 136 138 138  K 5.5*   < > 4.3 4.0 4.0  CL 99   < > 108 109 108  CO2 23   < > 22 24 25   GLUCOSE 178*   < > 107* 134* 108*  BUN 50*   < > 41* 23 20  CREATININE 1.97*   < > 1.41* 1.25* 1.10*  CALCIUM 8.9   < > 7.5* 7.8* 7.9*  PROT 7.6  --   --   --   --   ALBUMIN 3.3*  --   --   --   --   AST 30  --   --   --   --   ALT 16  --   --   --   --   ALKPHOS 59  --   --   --   --  BILITOT 0.8  --   --   --   --   GFRNONAA 24*   < > 36* 41* 48*  GFRAA 28*   < > 41* 48* 56*  ANIONGAP 13   < > 6 5 5    < > = values in this interval not displayed.     Hematology Recent Labs  Lab 10/08/19 0623 10/09/19 0520 10/10/19 0738  WBC 18.8* 15.1* 11.6*  RBC 4.58 4.08 4.29  HGB 12.0 10.9* 11.3*  HCT 38.2 34.8* 36.8  MCV 83.4 85.3 85.8  MCH 26.2 26.7 26.3  MCHC 31.4 31.3 30.7  RDW 24.8* 24.4* 23.8*  PLT 213 189 211    Cardiac Enzymes  Recent Labs  Lab 10/07/19 1146 10/07/19 1435  TROPONINIHS 562* 484*      Radiology    CT ABDOMEN PELVIS WO CONTRAST  Result Date: 10/07/2019 CLINICAL DATA:  Abdominal distension, nausea and vomiting. EXAM: CT ABDOMEN AND PELVIS WITHOUT CONTRAST TECHNIQUE: Multidetector CT imaging of the abdomen and pelvis was performed following the standard protocol without IV contrast. COMPARISON:  CT abdomen and pelvis 03/26/2017. FINDINGS: Lower chest: Extensive emphysematous disease is present in the lung bases. No pleural or pericardial  effusion. Heart size is normal. Calcific coronary artery disease noted. Hepatobiliary: No focal liver abnormality is seen. No gallstones, gallbladder wall thickening, or biliary dilatation. Pancreas: Unremarkable. No pancreatic ductal dilatation or surrounding inflammatory changes. Spleen: Normal in size without focal abnormality. Adrenals/Urinary Tract: The adrenal glands appear normal. There is no hydronephrosis on the right or left. Nonobstructing 0.3 cm stone in the left kidney is noted. The kidneys otherwise appear normal. Urinary bladder is distended. Stomach/Bowel: Stomach is within normal limits. Appendix appears normal. No evidence of bowel wall thickening, distention, or inflammatory changes. Large volume of stool in the rectosigmoid colon noted. Vascular/Lymphatic: No significant vascular findings are present. No enlarged abdominal or pelvic lymph nodes. Reproductive: Status post hysterectomy. No adnexal masses. Other: None. Musculoskeletal: No acute abnormality. Convex left lumbar scoliosis and degenerative disease at L4-5 are seen. IMPRESSION: No acute abnormality abdomen or pelvis. Distended urinary bladder noted. Large volume of stool in the rectosigmoid colon. Aortic Atherosclerosis (ICD10-I70.0) and Emphysema (ICD10-J43.9). Small nonobstructing stone left kidney. Electronically Signed   By: Inge Rise M.D.   On: 10/07/2019 15:18   DG Abd 1 View  Result Date: 10/08/2019 CLINICAL DATA:  Multiple bowel movements EXAM: ABDOMEN - 1 VIEW COMPARISON:  CT 10/07/2019 FINDINGS: Resolution of the large rectal stool ball seen on comparison CT from 1 day prior. Some persistent air-filled loops of clustered mid abdominal small bowel without frank distention are nonspecific. Only small to moderate residual colonic stool burden. Degenerative changes again noted in the spine hips and pelvis. No suspicious calcifications. Multiple phleboliths in the pelvis. Additional vascular calcium over the course of the  aorta and iliac arteries. Remaining soft tissues are unremarkable. IMPRESSION: 1. Resolution of the large rectal stool ball seen on comparison CT from 1 day prior. 2. Some persistent air-filled loops of mid abdominal small bowel without frank distention are nonspecific. Could reflect ileus or early obstruction. Electronically Signed   By: Lovena Le M.D.   On: 10/08/2019 15:11   DG Chest Portable 1 View  Result Date: 10/07/2019 CLINICAL DATA:  Weakness EXAM: PORTABLE CHEST 1 VIEW COMPARISON:  05/28/2019 FINDINGS: Cardiac shadow is within normal limits. Stable aortic calcifications are seen. Lungs are well aerated bilaterally. Diffuse emphysematous changes are seen with crowding of markings in the bases bilaterally.  Left chest port is again seen and stable. No bony abnormality is noted. IMPRESSION: Changes of COPD without acute abnormality. Aortic Atherosclerosis (ICD10-I70.0) and Emphysema (ICD10-J43.9). Electronically Signed   By: Inez Catalina M.D.   On: 10/07/2019 14:37   ECHOCARDIOGRAM COMPLETE  Result Date: 10/09/2019    ECHOCARDIOGRAM REPORT   Patient Name:   Jamilee MARIE Piehl Date of Exam: 10/09/2019 Medical Rec #:  [id number]           Height:       62.0 in Accession #:    [id number]          Weight:       152.5 lb Date of Birth:  01-08-1941           BSA:          1.704 m Patient Age:    22 years            BP:           134/62 mmHg Patient Gender: F                   HR:           80 bpm. Exam Location:  [facility] Procedure: 2D Echo, Cardiac Doppler and Color Doppler Indications:    CAD- native vessel 414.01  History:        Patient has prior history of Echocardiogram examinations, most                 recent 08/28/2018. CHF, COPD, Signs/Symptoms:Dyspnea; Risk                 Factors:Hypertension and Dyslipidemia.  Sonographer:    [name] RDCS (AE) Referring Phys: [place] Comments: Suboptimal parasternal window. Image acquisition challenging due to COPD. IMPRESSIONS  1. Left  ventricular ejection fraction, by estimation, is 50 to 55%. The left ventricle has low normal function. The left ventricle has no regional wall motion abnormalities. Left ventricular diastolic parameters are consistent with Grade I diastolic dysfunction (impaired relaxation).  2. Right ventricular systolic function is normal. The right ventricular size is normal. There is normal pulmonary artery systolic pressure.  3. Challenging image quality FINDINGS  Left Ventricle: Left ventricular ejection fraction, by estimation, is 50 to 55%. The left ventricle has low normal function. The left ventricle has no regional wall motion abnormalities. The left ventricular internal cavity size was normal in size. There is no left ventricular hypertrophy. Left ventricular diastolic parameters are consistent with Grade I diastolic dysfunction (impaired relaxation). Right Ventricle: The right ventricular size is normal. No increase in right ventricular wall thickness. Right ventricular systolic function is normal. There is normal pulmonary artery systolic pressure. The tricuspid regurgitant velocity is 2.01 m/s, and  with an assumed right atrial pressure of 10 mmHg, the estimated right ventricular systolic pressure is 29.5 mmHg. Left Atrium: Left atrial size was normal in size. Right Atrium: Right atrial size was normal in size. Pericardium: There is no evidence of pericardial effusion. Mitral Valve: The mitral valve is normal in structure. Normal mobility of the mitral valve leaflets. No evidence of mitral valve regurgitation. No evidence of mitral valve stenosis. Tricuspid Valve: The tricuspid valve is normal in structure. Tricuspid valve regurgitation is not demonstrated. No evidence of tricuspid stenosis. Aortic Valve: The aortic valve is normal in structure. Aortic valve regurgitation is not visualized. No aortic stenosis is present. Aortic valve mean gradient measures 2.0 mmHg. Aortic valve peak gradient measures  5.1 mmHg.  Aortic valve area, by VTI measures 2.45 cm. Pulmonic Valve: The pulmonic valve was normal in structure. Pulmonic valve regurgitation is not visualized. No evidence of pulmonic stenosis. Aorta: The aortic root is normal in size and structure. Venous: The inferior vena cava is normal in size with greater than 50% respiratory variability, suggesting right atrial pressure of 3 mmHg. IAS/Shunts: No atrial level shunt detected by color flow Doppler.  LEFT VENTRICLE PLAX 2D LVIDd:         4.28 cm  Diastology LVIDs:         2.81 cm  LV e' lateral:   5.55 cm/s LV PW:         1.15 cm  LV E/e' lateral: 12.8 LV IVS:        0.85 cm  LV e' medial:    4.90 cm/s LVOT diam:     2.00 cm  LV E/e' medial:  14.5 LV SV:         48 LV SV Index:   28 LVOT Area:     3.14 cm  RIGHT VENTRICLE RV Basal diam:  3.35 cm RV S prime:     17.00 cm/s TAPSE (M-mode): 3.9 cm LEFT ATRIUM             Index       RIGHT ATRIUM           Index LA diam:        2.90 cm 1.70 cm/m  RA Area:     16.30 cm LA Vol (A2C):   95.6 ml 56.12 ml/m RA Volume:   44.90 ml  26.36 ml/m LA Vol (A4C):   27.1 ml 15.91 ml/m LA Biplane Vol: 54.9 ml 32.23 ml/m  AORTIC VALVE AV Area (Vmax):    2.15 cm AV Area (Vmean):   2.25 cm AV Area (VTI):     2.45 cm AV Vmax:           112.50 cm/s AV Vmean:          71.550 cm/s AV VTI:            0.196 m AV Peak Grad:      5.1 mmHg AV Mean Grad:      2.0 mmHg LVOT Vmax:         77.00 cm/s LVOT Vmean:        51.200 cm/s LVOT VTI:          0.153 m LVOT/AV VTI ratio: 0.78  AORTA Ao Root diam: 3.00 cm MITRAL VALVE                TRICUSPID VALVE MV Area (PHT): 2.95 cm     TR Peak grad:   16.2 mmHg MV Decel Time: 257 msec     TR Vmax:        201.00 cm/s MV E velocity: 71.10 cm/s MV A velocity: 134.00 cm/s  SHUNTS MV E/A ratio:  0.53         Systemic VTI:  0.15 m                             Systemic Diam: 2.00 cm Ida Rogue MD Electronically signed by Ida Rogue MD Signature Date/Time: 10/09/2019/4:58:57 PM    Final     Telemetry     RSR - Personally Reviewed  ECG    10/07/2019, 11:37 AM: Regular sinus rhythm, 81, inferior and anterolateral T wave inversion- Personally  Reviewed  Cardiac Studies   2D Echocardiogram 5.6.2020  1. The left ventricle has normal systolic function, with an ejection  fraction of 50 to 55%. The cavity size was normal. Septal wall  hypokinesis. Grade I diastolic dysfunction.   2. The right ventricle has normal systolic function. The cavity was  normal. There is no increase in right ventricular wall thickness.Unable to  estimate RVSP.  _____________  *2D Echocardiogram 6.16.2021 pending*  Patient Profile     79 y.o. female female with a history of non-STEMI/CAD status post PCI to the LAD and staged PCI to the circumflex in December 2019, chronic HFrEF secondary to ischemic cardiomyopathy with improved EF (50-55% by echo May 2020), hypertension, hyperlipidemia, breast cancer status post chemoradiation, iron deficiency anemia, and GERD, who presented to the emergency department June 15 with constipation, abdominal pain, chest pain, and high-sensitivity troponin elevation ([phone]).  Assessment & Plan    1.  Coronary artery disease/demand ischemia:   h/o NSTEMI in 03/2018 w/ PCI/DES to the LAD and LCX.   --- Demand ischemia in the setting of significant constipation, urinary stress, sepsis ----Myoview scheduled this morning Echo with ejection fraction 50 to 55%, grossly no focal wall motion abnormality  2.  Ischemic cardiomyopathy/heart failure with improved EF:  EF previously 30 to 35% in the setting of non-STEMI in December 2019 with subsequent improvement to 50-55% by echo in May 2020.  Unchanged on echo this admission -Appears relatively euvolemic  3.  Hyperlipidemia:   LDL of 51 by outside labs in 11/2018.   Continue statin  4.  Acute kidney injury:  In setting of sepsis/UTI w/ difficulty urinating.   Elevated creatinine on arrival in the setting of urinary retention, likely  ATN,  Renal function down to baseline  5.  Sepsis/UTI/Leukocytosis:   Foul smelling urine x ~ 1 month, esp in the AM.  Urine retention, very difficult to place cath Indwelling Foley --Outpatient follow-up with urology   Total encounter time more than 25 minutes  Greater than 50% was spent in counseling and coordination of care with the patient   Signed, Ida Rogue, MD  10/10/2019, 12:56 PM    For questions or updates, please contact   Please consult [email or web address] for contact info under Cardiology/STEMI.

## 2019-10-10 NOTE — Progress Notes (Signed)
Heparin level drawn from accessed port and sent to lab.

## 2019-10-10 NOTE — Evaluation (Signed)
Physical Therapy Evaluation Patient Details Name: Krista Cisneros MRN: [id number] DOB: [birth year] Today's Date: 10/10/2019   History of Present Illness  79 y.o. female with history h/o CAD, chronic systolic CHF with EF 97% (normalized to 55% in May 2020), COPD, right breast cancer s/p chemoradiation, depression/anxiety, hypertension, hyperlipidemia, GERD presented to the ED with complaints of feeling weak over the last couple of days.    Clinical Impression  Pt did well with bed mobility, did not require walker when she transitioned to standing (does not typically need AD at baseline) but showed definite unsteadiness on initial few steps and we agreed that a walker is a better and more appropriate option at this point.  She was able to walk ~100 ft in the hallway, we were most of the way to the steps to trial when pt started having a BM in the hallway, returned to room.  Pt has 24/7 supervision, will need to use walker which was discussed with family.  Overall did well enough to get home, will need HHPT.   Follow Up Recommendations Home health PT    Equipment Recommendations  None recommended by PT    Recommendations for Other Services       Precautions / Restrictions Precautions Precautions: Fall Restrictions Weight Bearing Restrictions: No      Mobility  Bed Mobility Overal bed mobility: Modified Independent                Transfers Overall transfer level: Modified independent Equipment used: Rolling walker (2 wheeled)             General transfer comment: Pt was able to rise with heavy UE use, but did not need direct phyiscal assist  Ambulation/Gait Ambulation/Gait assistance: Supervision Gait Distance (Feet): 100 Feet Assistive device: Rolling walker (2 wheeled)       General Gait Details: trial of ambulation w/o AD however pt showed some unsteadiness and we mutually agreed to use the walker during hallway ambulation.  Pt was able to maintain consistent  and appropriate cadence with moderate reliance on the walker.  Attempted to get to stair well to assess how she would do with these however needed to return to room as pt started to actively have a BM.    Stairs            Wheelchair Mobility    Modified Rankin (Stroke Patients Only)       Balance Overall balance assessment: Modified Independent (pt needs AD for standing balance, does not at baseline)                                           Pertinent Vitals/Pain Pain Assessment: No/denies pain    Home Living Family/patient expects to be discharged to:: Private residence Living Arrangements: Children Available Help at Discharge: Available 24 hours/day;Family Type of Home: House Home Access: Stairs to enter Entrance Stairs-Rails:  (yes) Entrance Stairs-Number of Steps: 3 Home Layout: Two level;Bed/bath upstairs Home Equipment: Walker - 2 wheels;Cane - single point;Bedside commode      Prior Function Level of Independence: Independent         Comments: Per pt and  daughter pt does a lot of the cooking, is able to mange all ADLs and is generally active and independent     Hand Dominance        Extremity/Trunk Assessment   Upper Extremity Assessment  Upper Extremity Assessment: Overall WFL for tasks assessed    Lower Extremity Assessment Lower Extremity Assessment: Overall WFL for tasks assessed       Communication   Communication: No difficulties  Cognition Arousal/Alertness: Awake/alert Behavior During Therapy: WFL for tasks assessed/performed Overall Cognitive Status: Within Functional Limits for tasks assessed                                        General Comments      Exercises     Assessment/Plan    PT Assessment Patient needs continued PT services  PT Problem List Decreased strength;Decreased range of motion;Decreased activity tolerance;Decreased balance;Decreased mobility;Decreased  coordination;Decreased cognition;Decreased knowledge of use of DME;Decreased knowledge of precautions;Decreased safety awareness       PT Treatment Interventions DME instruction;Gait training;Stair training;Functional mobility training;Therapeutic exercise;Therapeutic activities;Balance training;Neuromuscular re-education;Patient/family education    PT Goals (Current goals can be found in the Care Plan section)  Acute Rehab PT Goals Patient Stated Goal: go home  PT Goal Formulation: With patient Time For Goal Achievement: 10/24/19 Potential to Achieve Goals: Good    Frequency Min 2X/week   Barriers to discharge        Co-evaluation               AM-PAC PT "6 Clicks" Mobility  Outcome Measure Help needed turning from your back to your side while in a flat bed without using bedrails?: None Help needed moving from lying on your back to sitting on the side of a flat bed without using bedrails?: None Help needed moving to and from a bed to a chair (including a wheelchair)?: None Help needed standing up from a chair using your arms (e.g., wheelchair or bedside chair)?: None Help needed to walk in hospital room?: A Little Help needed climbing 3-5 steps with a railing? : A Little 6 Click Score: 22    End of Session Equipment Utilized During Treatment: Gait belt Activity Tolerance: Patient tolerated treatment well Patient left: with nursing/sitter in room;with family/visitor present (commode) Nurse Communication: Mobility status PT Visit Diagnosis: Muscle weakness (generalized) (M62.81);Difficulty in walking, not elsewhere classified (R26.2)    Time: 3474-2595 PT Time Calculation (min) (ACUTE ONLY): 22 min   Charges:   PT Evaluation $PT Eval Low Complexity: 1 Low          Kreg Shropshire, DPT 10/10/2019, 3:27 PM

## 2019-10-10 NOTE — Care Management Important Message (Signed)
Important Message  Patient Details  Name: Krista Cisneros MRN: [id number] Date of Birth: 11/25/1940   Medicare Important Message Given:  Yes     [name] 10/10/2019, 2:43 PM

## 2019-10-10 NOTE — Discharge Summary (Signed)
Physician Discharge Summary  Abbigael Detlefsen JSH:[id number] DOB: 23-Sep-1940 DOA: 10/07/2019  PCP: Sharyne Peach, MD  Admit date: 10/07/2019 Discharge date: 10/10/2019 Consultations: Cardiology, Urology Admitted From: home Disposition: home with [facility]  Discharge Diagnoses:  Principal Problem:   Sepsis due to Escherichia coli (E. coli) ([place]) Active Problems:   NSTEMI (non-ST elevated myocardial infarction) ([place])   Acute lower UTI   Constipation   Essential hypertension   GERD (gastroesophageal reflux disease)   Malignant neoplasm of right female breast ([place])   Hyperlipidemia, mixed   Coronary artery disease involving native coronary artery of native heart with angina pectoris ([place])   Chronic systolic heart failure ([place])   SIRS (systemic inflammatory response syndrome) ([place])   Urinary retention   Hospital Course Summary: 79 y.o.femalewith history h/oCAD, chronic systolic CHF with EF 85% (normalized to 55% in May 2020), COPD, right breast cancer s/p chemoradiation, depression/anxiety, hypertension, hyperlipidemia, GERD presented to the ED with complaints offeeling weak over the last couple of days. Today she woke up feeling very tired and had no energy. She also reports constipation as well as trouble urinating lately-she describes as having to strain to urinate. She apparently was having intermittent chest discomfort a week back for which she sought medical attention with her primary cardiologist and was prescribed Imdur about 7 to 10 days back. She denies any fever or chills. She denies any chest pain but reports feeling sweaty this morning. She denies any headaches, melena or hematochezia. Her last good bowel movement was 3 weeks back. She takes Aldactone daily but has not taken Lasix (prescribed for as needed use) for couple of months now. ED Course: Afebrile, Blood pressure89/59-118/45, respiratory rate 22, pulse 63, temp 98.53F, O2 sat 96% on room air. WBC 24.2,hemoglobin  14.5, platelet 273. Sodium 135, potassium 5.5,chloride 99, bicarb 23, glucose 178, BUN 50, creatinine 1.97 (baseline appears to be around 1.2), calcium 8.9. Magnesium 3.1, albumin 3.3, lipase 19. LFTs within normal limits. Troponin562->484. Lactate 2.1->0.9. Chest x-ray unremarkable. UA pending. CT of the abdomen and pelvis showed significant amount of stool burden in rectosigmoid colon, distended urinary bladder, nonobstructing left nephrolithiasis. Patient suspected to have sepsis, dehydration and started on IV fluids with broad-spectrum IV antibiotics. Per ED nurse straight cath attempted but patient had just voided in her pants. Urine sample not sent.  Hospital course: Patient admitted to [facility] for further evaluation and management.Patient had worsening urinary retention overnight and indwelling Foley catheter could not be placed.  Bladder scan revealed greater than 900 mL retention.  Seen by urology and indwelling Foley placed. U/A sent finally and suggestive of UTI.Urine cultures grew E coli.   1.UTI with sepsis: Patient had hypotension, tachypnea, lactic acidosis on presentation with labs showing significant leukocytosis.  UA showed significant pyuria (greater than 50) with bacteriuria, positive nitrite and large leukocyte esterase.  Urine cultures grew pansensitive E coli. Leukocytosis downtrended from 24 k to 11k prior to discharge. She is now transitioned from IV Azactam (PCN allergy) to ciprofloxacin that she can take for 2 more days to complete course   2.Abdominal pain: Secondary to constipation/urinary retention. Patient was able to void in the ED before straight cath yesterday but had recurrence of significant urinary retention overnight requiring urology intervention/Foley catheter insertion.  Appreciate urology input..  CT abdomen did show stool burden yesterday and laxatives were ordered but patient had several bowel movements since then.No other significant findings on CT  abdomen.  Repeat KUB showed nonspecific findings?  Ileus but with improvement  in stool burden-clinically abd soft. Can continue stool softeners and miralax prn on discharge. Advised patient to avoid constipation as daughter reported that patient had urinary retention in the past as well in the setting of severe constipation.  Patient now has indwelling Foley catheter to be kept in place for 1 week until urology follow-up.  Patient given leg bag training in the presence of daughter/son-in-law prior to discharge.  3.CAD/atypical chest pain: Concern for non-STEMI given abnormal EKG and elevated troponins. Evaluated by cardiology and started on heparin drip although there seem to be leaning to with demand ischemia in the setting of initial hypotension and possible infection. Last cardiac cath in 04/2018 did show patent stents and 90% obstructive disease in mid LCx-medical management was recommended at that time. Resume antiplatelet agents/statins/low-dose Coreg (3.125, was taking 6.25 at home) as tolerated by blood pressure . Hold Imdur/ACE inhibitors inconcern for hypotension, nitro as needed available.  Appreciate cardiology input-received IV heparin for 48 hours.[place] today prior to discharge  which showed EF 45% but no ischemic changes and cleared by cardiology for discharge.    4.Chronic systolic cardiomyopathy: Patient previously had EF of 35% but improved to 55% on last echo. No signs of volume overload. Appears dry. Held ACE inhibitors/diuretics inconcern for AKI. Patient's blood pressure in the last 24 hours fluctuating between systolic 497-0 20, will be resumed on Coreg 3.125 mg, enalapril but advised to continue holding Imdur, Aldactone for another week until PCP/cardiology follow-up and further titration.  5.AKI on chronic kidney disease stage IIIa: Likely secondary to dehydration as well as urinary retention.  Improving with IV fluids and Foley catheter  placement.Patient's baseline creatinine appears to be around 1.2 although in February 2021, patient did have isolated labs showing creatinine elevation upto 1.5. Patient received IV fluids while holding diuretics/ACE inhibitors. Patient had mild hyperkalemia (5.5) which has resolved now.  She is advised to discontinue daily potassium supplements and hold Aldactone for now.  She only takes Lasix as needed--can continue the same.  Advised to take potassium supplement only if she is taking Lasix.  Should have repeat labs, BMP done in 1 week  6.History of breast cancer: S/p chemoradiation.Resume home regimen  7.GERD: PPI  8.Depression/anxiety:Resume home medications  9.COPD:Stable. Resume home medications     Discharge Exam:   Vitals:   10/09/19 1553 10/09/19 1928 10/10/19 0601 10/10/19 0750  BP: (!) 110/58 120/60 136/71 125/84  Pulse: 79 90 75 77  Resp: 16 17 17 17   Temp: 97.6 F (36.4 C) 98.7 F (37.1 C) 98 F (36.7 C) 97.6 F (36.4 C)  TempSrc:  Oral  Oral  SpO2: 96% 99% 98% 99%  Weight:   70.3 kg   Height:        General: Pt is alert, awake, not in acute distress Cardiovascular: RRR, S1/S2 +, no rubs, no gallops Respiratory: CTA bilaterally, no wheezing, no rhonchi Abdominal: Soft, NT, ND, bowel sounds + Extremities: no edema, no cyanosis  Discharge Condition:Stable CODE STATUS: Full code Diet recommendation: 2 g sodium diet Recommendations for Outpatient Follow-up:  1. Follow up with PCP: 5 to 7 days 2. Follow up with consultants: Urology clinic for voiding trial in 7 to 10 days.  Cardiology clinic in 2 weeks 3. Please obtain follow up labs including: BMP in 1 week  [place] services upon discharge: Yes Equipment/Devices upon discharge: Leg bag, indwelling Foley catheter   Discharge Instructions:  Discharge Instructions    (HEART FAILURE PATIENTS) Call MD:  Anytime you have any of the following symptoms: 1) 3 pound weight gain in 24 hours  or 5 pounds in 1 week 2) shortness of breath, with or without a dry hacking cough 3) swelling in the hands, feet or stomach 4) if you have to sleep on extra pillows at night in order to breathe.   Complete by: As directed    Call MD for:  extreme fatigue   Complete by: As directed    Call MD for:  persistant dizziness or light-headedness   Complete by: As directed    Call MD for:  persistant nausea and vomiting   Complete by: As directed    Call MD for:  severe uncontrolled pain   Complete by: As directed    Call MD for:  temperature >100.4   Complete by: As directed    Diet - low sodium heart healthy   Complete by: As directed    Face-to-face encounter (required for Medicare/Medicaid patients)   Complete by: As directed    I [place] Shi certify that this patient is under my care and that I, or a nurse practitioner or physician's assistant working with me, had a face-to-face encounter that meets the physician face-to-face encounter requirements with this patient on 10/10/2019. The encounter with the patient was in whole, or in part for the following medical condition(s) which is the primary reason for home health care (List medical condition): deconditioning, muscle weakness, urinary retention, new Foley catheter   The encounter with the patient was in whole, or in part, for the following medical condition, which is the primary reason for home health care: dehydration, UTI, sepsis, Chronic CHF   I certify that, based on my findings, the following services are medically necessary home health services: Physical therapy   Reason for Medically Necessary Home Health Services:  Skilled Nursing- Change/Decline in Patient Status Therapy- Personnel officer, Transfer Training and Stair Training     My clinical findings support the need for the above services:  Shortness of breath with activity Unable to leave home safely without assistance and/or assistive device     Further, I certify that my  clinical findings support that this patient is homebound due to: Shortness of Breath with activity   Home Health   Complete by: As directed    To provide the following care/treatments: PT   Increase activity slowly   Complete by: As directed      Allergies as of 10/10/2019      Reactions   Nsaids    Bleeding risk   Penicillins Anaphylaxis, Swelling, Rash   Has patient had a PCN reaction causing immediate rash, facial/tongue/throat swelling, SOB or lightheadedness with hypotension: Yes Has patient had a PCN reaction causing severe rash involving mucus membranes or skin necrosis: No Has patient had a PCN reaction that required hospitalization: No  Has patient had a PCN reaction occurring within the last 10 years: No If all of the above answers are "NO", then may proceed with Cephalosporin use.   Atorvastatin Other (See Comments)   Muscle Pain   Gabapentin Swelling   Ezetimibe Other (See Comments)   Muscle pain   Aspirin    GI bleeding risk      Medication List    STOP taking these medications   isosorbide mononitrate 30 MG 24 hr tablet Commonly known as: IMDUR   potassium chloride SA 20 MEQ tablet Commonly known as: Klor-Con M20   spironolactone 25 MG tablet Commonly known as: ALDACTONE  TAKE these medications   Advair Diskus 500-50 MCG/DOSE Aepb Generic drug: Fluticasone-Salmeterol Inhale 1 puff into the lungs 2 (two) times daily.   albuterol 108 (90 Base) MCG/ACT inhaler Commonly known as: VENTOLIN HFA Inhale 2 puffs into the lungs every 6 (six) hours as needed for wheezing or shortness of breath.   aspirin EC 81 MG tablet Take 81 mg by mouth daily.   baclofen 10 MG tablet Commonly known as: LIORESAL Take 10 mg by mouth at bedtime.   Calcium-Vitamin D-Vitamin K 983-382-50 MG-UNT-MCG Tabs Take 1 tablet by mouth daily.   carvedilol 3.125 MG tablet Commonly known as: COREG Take 2 tablets (6.25 mg total) by mouth 2 (two) times daily. What changed:  medication strength   cholecalciferol 25 MCG (1000 UNIT) tablet Commonly known as: VITAMIN D3 Take 1,000 Units by mouth daily.   ciprofloxacin 250 MG tablet Commonly known as: CIPRO Take 1 tablet (250 mg total) by mouth 2 (two) times daily for 2 days.   clopidogrel 75 MG tablet Commonly known as: PLAVIX Take 1 tablet (75 mg total) by mouth daily with breakfast.   Coenzyme Q10 300 MG Caps Take 300 mg by mouth daily.   DULoxetine 30 MG capsule Commonly known as: CYMBALTA Take 30 mg by mouth every morning.   enalapril 5 MG tablet Commonly known as: VASOTEC TAKE 1 TABLET BY MOUTH EVERY DAY   fluticasone 50 MCG/ACT nasal spray Commonly known as: FLONASE Place 2 sprays into the nose daily.   furosemide 20 MG tablet Commonly known as: Lasix Take 1 tablet (20 mg total) by mouth as needed for edema.   letrozole 2.5 MG tablet Commonly known as: FEMARA TAKE 1 TABLET BY MOUTH EVERY DAY   lidocaine-prilocaine cream Commonly known as: EMLA Apply 1 application as needed topically (for port access).   magnesium oxide 400 MG tablet Commonly known as: MAG-OX Take 400 mg by mouth daily.   melatonin 5 MG Tabs Take 10 mg by mouth at bedtime as needed (sleep).   Melatonin-Pyridoxine 5-1 MG Tabs Take 1 tablet by mouth daily.   montelukast 10 MG tablet Commonly known as: SINGULAIR Take 10 mg by mouth at bedtime.   nitroGLYCERIN 0.4 MG SL tablet Commonly known as: NITROSTAT Place 1 tablet (0.4 mg total) under the tongue every 5 (five) minutes as needed for chest pain.   pantoprazole 40 MG tablet Commonly known as: PROTONIX Take 1 tablet (40 mg total) by mouth daily.   rosuvastatin 20 MG tablet Commonly known as: CRESTOR Take 1 tablet (20 mg total) by mouth daily at 6 PM.       Follow-up Information    Loel Dubonnet, NP. Go on 10/21/2019.   Specialty: Cardiology Why: appointment at 9:30am Contact information: [address] [place]  [phone]              Allergies  Allergen Reactions  . Nsaids     Bleeding risk  . Penicillins Anaphylaxis, Swelling and Rash    Has patient had a PCN reaction causing immediate rash, facial/tongue/throat swelling, SOB or lightheadedness with hypotension: Yes Has patient had a PCN reaction causing severe rash involving mucus membranes or skin necrosis: No Has patient had a PCN reaction that required hospitalization: No  Has patient had a PCN reaction occurring within the last 10 years: No If all of the above answers are "NO", then may proceed with Cephalosporin use.   . Atorvastatin Other (See Comments)    Muscle  Pain  . Gabapentin Swelling  . Ezetimibe Other (See Comments)    Muscle pain  . Aspirin     GI bleeding risk      The results of significant diagnostics from this hospitalization (including imaging, microbiology, ancillary and laboratory) are listed below for reference.    Labs: BNP (last 3 results) No results for input(s): BNP in the last 8760 hours. Basic Metabolic Panel: Recent Labs  Lab 10/07/19 1146 10/07/19 1435 10/08/19 0623 10/09/19 1401 10/10/19 0738  NA 135  --  136 138 138  K 5.5*  --  4.3 4.0 4.0  CL 99  --  108 109 108  CO2 23  --  22 24 25   GLUCOSE 178*  --  107* 134* 108*  BUN 50*  --  41* 23 20  CREATININE 1.97*  --  1.41* 1.25* 1.10*  CALCIUM 8.9  --  7.5* 7.8* 7.9*  MG  --  3.1*  --   --   --    Liver Function Tests: Recent Labs  Lab 10/07/19 1146  AST 30  ALT 16  ALKPHOS 59  BILITOT 0.8  PROT 7.6  ALBUMIN 3.3*   Recent Labs  Lab 10/07/19 1146  LIPASE 19   No results for input(s): AMMONIA in the last 168 hours. CBC: Recent Labs  Lab 10/07/19 1146 10/08/19 0623 10/09/19 0520 10/10/19 0738  WBC 24.2* 18.8* 15.1* 11.6*  HGB 14.5 12.0 10.9* 11.3*  HCT 46.4* 38.2 34.8* 36.8  MCV 83.6 83.4 85.3 85.8  PLT 273 213 189 211   Cardiac Enzymes: No results for input(s): CKTOTAL, CKMB, CKMBINDEX, TROPONINI in  the last 168 hours. BNP: Invalid input(s): POCBNP CBG: No results for input(s): GLUCAP in the last 168 hours. D-Dimer No results for input(s): DDIMER in the last 72 hours. Hgb A1c No results for input(s): HGBA1C in the last 72 hours. Lipid Profile No results for input(s): CHOL, HDL, LDLCALC, TRIG, CHOLHDL, LDLDIRECT in the last 72 hours. Thyroid function studies No results for input(s): TSH, T4TOTAL, T3FREE, THYROIDAB in the last 72 hours.  Invalid input(s): FREET3 Anemia work up No results for input(s): VITAMINB12, FOLATE, FERRITIN, TIBC, IRON, RETICCTPCT in the last 72 hours. Urinalysis    Component Value Date/Time   COLORURINE YELLOW (A) 10/08/2019 0208   APPEARANCEUR CLOUDY (A) 10/08/2019 0208   LABSPEC 1.019 10/08/2019 0208   PHURINE 5.0 10/08/2019 0208   GLUCOSEU NEGATIVE 10/08/2019 0208   HGBUR MODERATE (A) 10/08/2019 0208   BILIRUBINUR NEGATIVE 10/08/2019 0208   KETONESUR NEGATIVE 10/08/2019 0208   PROTEINUR NEGATIVE 10/08/2019 0208   NITRITE POSITIVE (A) 10/08/2019 0208   LEUKOCYTESUR LARGE (A) 10/08/2019 0208   Sepsis Labs Invalid input(s): PROCALCITONIN,  WBC,  LACTICIDVEN Microbiology Recent Results (from the past 240 hour(s))  Blood culture (routine x 2)     Status: None (Preliminary result)   Collection Time: 10/07/19  2:35 PM   Specimen: BLOOD  Result Value Ref Range Status   Specimen Description BLOOD LEFT ANTECUBITAL  Final   Special Requests   Final    BOTTLES DRAWN AEROBIC AND ANAEROBIC Blood Culture adequate volume   Culture   Final    NO GROWTH 3 DAYS Performed at [facility], [address] [place], [place] [address]    Report Status PENDING  Incomplete  Blood culture (routine x 2)     Status: None (Preliminary result)   Collection Time: 10/07/19  4:55 PM   Specimen: BLOOD  Result Value Ref Range Status  Specimen Description BLOOD PORTA CATH  Final   Special Requests   Final    BOTTLES DRAWN AEROBIC AND ANAEROBIC Blood Culture  adequate volume   Culture   Final    NO GROWTH 3 DAYS Performed at [facility], [address] [place], [place] [address]    Report Status PENDING  Incomplete  SARS Coronavirus 2 by RT PCR (hospital order, performed in [facility] hospital lab) Nasopharyngeal Nasopharyngeal Swab     Status: None   Collection Time: 10/07/19  4:56 PM   Specimen: Nasopharyngeal Swab  Result Value Ref Range Status   SARS Coronavirus 2 NEGATIVE NEGATIVE Final    Comment: (NOTE) SARS-CoV-2 target nucleic acids are NOT DETECTED.  The SARS-CoV-2 RNA is generally detectable in upper and lower respiratory specimens during the acute phase of infection. The lowest concentration of SARS-CoV-2 viral copies this assay can detect is 250 copies / mL. A negative result does not preclude SARS-CoV-2 infection and should not be used as the sole basis for treatment or other patient management decisions.  A negative result may occur with improper specimen collection / handling, submission of specimen other than nasopharyngeal swab, presence of viral mutation(s) within the areas targeted by this assay, and inadequate number of viral copies (<250 copies / mL). A negative result must be combined with clinical observations, patient history, and epidemiological information.  Fact Sheet for Patients:   StrictlyIdeas.no  Fact Sheet for Healthcare Providers: BankingDealers.co.za  This test is not yet approved or  cleared by the [place] FDA and has been authorized for detection and/or diagnosis of SARS-CoV-2 by FDA under an Emergency Use Authorization (EUA).  This EUA will remain in effect (meaning this test can be used) for the duration of the COVID-19 declaration under Section 564(b)(1) of the Act, 21 U.S.C. section 360bbb-3(b)(1), unless the authorization is terminated or revoked sooner.  Performed at [facility], [address]  [place], [place] [address]   Urine culture     Status: Abnormal   Collection Time: 10/08/19  2:08 AM   Specimen: Urine, Random  Result Value Ref Range Status   Specimen Description   Final    URINE, RANDOM Performed at [facility] [address], [address] [place], [place] [address]    Special Requests   Final    NONE Performed at [facility], [place], [place] [address]    Culture 70,000 COLONIES/mL ESCHERICHIA COLI (A)  Final   Report Status 10/10/2019 FINAL  Final   Organism ID, Bacteria ESCHERICHIA COLI (A)  Final      Susceptibility   Escherichia coli - MIC*    AMPICILLIN 4 SENSITIVE Sensitive     CEFAZOLIN <=4 SENSITIVE Sensitive     CEFTRIAXONE <=0.25 SENSITIVE Sensitive     CIPROFLOXACIN <=0.25 SENSITIVE Sensitive     GENTAMICIN <=1 SENSITIVE Sensitive     IMIPENEM <=0.25 SENSITIVE Sensitive     NITROFURANTOIN <=16 SENSITIVE Sensitive     TRIMETH/SULFA <=20 SENSITIVE Sensitive     AMPICILLIN/SULBACTAM <=2 SENSITIVE Sensitive     PIP/TAZO <=4 SENSITIVE Sensitive     * 70,000 COLONIES/mL ESCHERICHIA COLI    Procedures/Studies: CT ABDOMEN PELVIS WO CONTRAST  Result Date: 10/07/2019 CLINICAL DATA:  Abdominal distension, nausea and vomiting. EXAM: CT ABDOMEN AND PELVIS WITHOUT CONTRAST TECHNIQUE: Multidetector CT imaging of the abdomen and pelvis was performed following the standard protocol without IV contrast. COMPARISON:  CT abdomen and pelvis 03/26/2017. FINDINGS: Lower chest: Extensive emphysematous  disease is present in the lung bases. No pleural or pericardial effusion. Heart size is normal. Calcific coronary artery disease noted. Hepatobiliary: No focal liver abnormality is seen. No gallstones, gallbladder wall thickening, or biliary dilatation. Pancreas: Unremarkable. No pancreatic ductal dilatation or surrounding inflammatory changes. Spleen: Normal in size without focal abnormality. Adrenals/Urinary Tract: The adrenal glands appear normal. There  is no hydronephrosis on the right or left. Nonobstructing 0.3 cm stone in the left kidney is noted. The kidneys otherwise appear normal. Urinary bladder is distended. Stomach/Bowel: Stomach is within normal limits. Appendix appears normal. No evidence of bowel wall thickening, distention, or inflammatory changes. Large volume of stool in the rectosigmoid colon noted. Vascular/Lymphatic: No significant vascular findings are present. No enlarged abdominal or pelvic lymph nodes. Reproductive: Status post hysterectomy. No adnexal masses. Other: None. Musculoskeletal: No acute abnormality. Convex left lumbar scoliosis and degenerative disease at L4-5 are seen. IMPRESSION: No acute abnormality abdomen or pelvis. Distended urinary bladder noted. Large volume of stool in the rectosigmoid colon. Aortic Atherosclerosis (ICD10-I70.0) and Emphysema (ICD10-J43.9). Small nonobstructing stone left kidney. Electronically Signed   By: Inge Rise M.D.   On: 10/07/2019 15:18   DG Abd 1 View  Result Date: 10/08/2019 CLINICAL DATA:  Multiple bowel movements EXAM: ABDOMEN - 1 VIEW COMPARISON:  CT 10/07/2019 FINDINGS: Resolution of the large rectal stool ball seen on comparison CT from 1 day prior. Some persistent air-filled loops of clustered mid abdominal small bowel without frank distention are nonspecific. Only small to moderate residual colonic stool burden. Degenerative changes again noted in the spine hips and pelvis. No suspicious calcifications. Multiple phleboliths in the pelvis. Additional vascular calcium over the course of the aorta and iliac arteries. Remaining soft tissues are unremarkable. IMPRESSION: 1. Resolution of the large rectal stool ball seen on comparison CT from 1 day prior. 2. Some persistent air-filled loops of mid abdominal small bowel without frank distention are nonspecific. Could reflect ileus or early obstruction. Electronically Signed   By: Lovena Le M.D.   On: 10/08/2019 15:11   NM Myocar  Multi W/Spect W/Wall Motion / EF  Result Date: 10/10/2019 Pharmacological myocardial perfusion imaging study with no significant  ischemia Normal wall motion, EF estimated at 44%.  Mildly depressed ejection fraction may be secondary to GI uptake artifact No EKG changes concerning for ischemia at peak stress or in recovery. Low risk scan Signed, Esmond Plants, MD, Ph.D [facility]   DG Chest Portable 1 View  Result Date: 10/07/2019 CLINICAL DATA:  Weakness EXAM: PORTABLE CHEST 1 VIEW COMPARISON:  05/28/2019 FINDINGS: Cardiac shadow is within normal limits. Stable aortic calcifications are seen. Lungs are well aerated bilaterally. Diffuse emphysematous changes are seen with crowding of markings in the bases bilaterally. Left chest port is again seen and stable. No bony abnormality is noted. IMPRESSION: Changes of COPD without acute abnormality. Aortic Atherosclerosis (ICD10-I70.0) and Emphysema (ICD10-J43.9). Electronically Signed   By: Inez Catalina M.D.   On: 10/07/2019 14:37   ECHOCARDIOGRAM COMPLETE  Result Date: 10/09/2019    ECHOCARDIOGRAM REPORT   Patient Name:   Carrera MARIE Piechota Date of Exam: 10/09/2019 Medical Rec #:  [id number]           Height:       62.0 in Accession #:    [id number]          Weight:       152.5 lb Date of Birth:  1941-04-23           BSA:  1.704 m Patient Age:    15 years            BP:           134/62 mmHg Patient Gender: F                   HR:           80 bpm. Exam Location:  [facility] Procedure: 2D Echo, Cardiac Doppler and Color Doppler Indications:    CAD- native vessel 414.01  History:        Patient has prior history of Echocardiogram examinations, most                 recent 08/28/2018. CHF, COPD, Signs/Symptoms:Dyspnea; Risk                 Factors:Hypertension and Dyslipidemia.  Sonographer:    [name] RDCS (AE) Referring Phys: [place] Comments: Suboptimal parasternal window. Image acquisition challenging due to COPD. IMPRESSIONS  1.  Left ventricular ejection fraction, by estimation, is 50 to 55%. The left ventricle has low normal function. The left ventricle has no regional wall motion abnormalities. Left ventricular diastolic parameters are consistent with Grade I diastolic dysfunction (impaired relaxation).  2. Right ventricular systolic function is normal. The right ventricular size is normal. There is normal pulmonary artery systolic pressure.  3. Challenging image quality FINDINGS  Left Ventricle: Left ventricular ejection fraction, by estimation, is 50 to 55%. The left ventricle has low normal function. The left ventricle has no regional wall motion abnormalities. The left ventricular internal cavity size was normal in size. There is no left ventricular hypertrophy. Left ventricular diastolic parameters are consistent with Grade I diastolic dysfunction (impaired relaxation). Right Ventricle: The right ventricular size is normal. No increase in right ventricular wall thickness. Right ventricular systolic function is normal. There is normal pulmonary artery systolic pressure. The tricuspid regurgitant velocity is 2.01 m/s, and  with an assumed right atrial pressure of 10 mmHg, the estimated right ventricular systolic pressure is 67.5 mmHg. Left Atrium: Left atrial size was normal in size. Right Atrium: Right atrial size was normal in size. Pericardium: There is no evidence of pericardial effusion. Mitral Valve: The mitral valve is normal in structure. Normal mobility of the mitral valve leaflets. No evidence of mitral valve regurgitation. No evidence of mitral valve stenosis. Tricuspid Valve: The tricuspid valve is normal in structure. Tricuspid valve regurgitation is not demonstrated. No evidence of tricuspid stenosis. Aortic Valve: The aortic valve is normal in structure. Aortic valve regurgitation is not visualized. No aortic stenosis is present. Aortic valve mean gradient measures 2.0 mmHg. Aortic valve peak gradient measures 5.1 mmHg.  Aortic valve area, by VTI measures 2.45 cm. Pulmonic Valve: The pulmonic valve was normal in structure. Pulmonic valve regurgitation is not visualized. No evidence of pulmonic stenosis. Aorta: The aortic root is normal in size and structure. Venous: The inferior vena cava is normal in size with greater than 50% respiratory variability, suggesting right atrial pressure of 3 mmHg. IAS/Shunts: No atrial level shunt detected by color flow Doppler.  LEFT VENTRICLE PLAX 2D LVIDd:         4.28 cm  Diastology LVIDs:         2.81 cm  LV e' lateral:   5.55 cm/s LV PW:         1.15 cm  LV E/e' lateral: 12.8 LV IVS:        0.85 cm  LV e' medial:    4.90 cm/s LVOT diam:     2.00 cm  LV E/e' medial:  14.5 LV SV:         48 LV SV Index:   28 LVOT Area:     3.14 cm  RIGHT VENTRICLE RV Basal diam:  3.35 cm RV S prime:     17.00 cm/s TAPSE (M-mode): 3.9 cm LEFT ATRIUM             Index       RIGHT ATRIUM           Index LA diam:        2.90 cm 1.70 cm/m  RA Area:     16.30 cm LA Vol (A2C):   95.6 ml 56.12 ml/m RA Volume:   44.90 ml  26.36 ml/m LA Vol (A4C):   27.1 ml 15.91 ml/m LA Biplane Vol: 54.9 ml 32.23 ml/m  AORTIC VALVE AV Area (Vmax):    2.15 cm AV Area (Vmean):   2.25 cm AV Area (VTI):     2.45 cm AV Vmax:           112.50 cm/s AV Vmean:          71.550 cm/s AV VTI:            0.196 m AV Peak Grad:      5.1 mmHg AV Mean Grad:      2.0 mmHg LVOT Vmax:         77.00 cm/s LVOT Vmean:        51.200 cm/s LVOT VTI:          0.153 m LVOT/AV VTI ratio: 0.78  AORTA Ao Root diam: 3.00 cm MITRAL VALVE                TRICUSPID VALVE MV Area (PHT): 2.95 cm     TR Peak grad:   16.2 mmHg MV Decel Time: 257 msec     TR Vmax:        201.00 cm/s MV E velocity: 71.10 cm/s MV A velocity: 134.00 cm/s  SHUNTS MV E/A ratio:  0.53         Systemic VTI:  0.15 m                             Systemic Diam: 2.00 cm Ida Rogue MD Electronically signed by Ida Rogue MD Signature Date/Time: 10/09/2019/4:58:57 PM    Final     Time  coordinating discharge: Over 30 minutes  SIGNED:   [place]ford Shi, MD  [facility] 10/10/2019, 3:18 PM

## 2019-10-12 LAB — CULTURE, BLOOD (ROUTINE X 2)
Culture: NO GROWTH
Culture: NO GROWTH
Special Requests: ADEQUATE
Special Requests: ADEQUATE

## 2019-10-15 ENCOUNTER — Other Ambulatory Visit: Payer: Self-pay

## 2019-10-15 ENCOUNTER — Ambulatory Visit (INDEPENDENT_AMBULATORY_CARE_PROVIDER_SITE_OTHER): Payer: Medicare Other | Admitting: Urology

## 2019-10-15 ENCOUNTER — Encounter: Payer: Self-pay | Admitting: Urology

## 2019-10-15 ENCOUNTER — Ambulatory Visit: Payer: Medicare Other | Admitting: Urology

## 2019-10-15 VITALS — BP 121/77 | HR 90 | Ht 62.0 in | Wt 145.0 lb

## 2019-10-15 DIAGNOSIS — R339 Retention of urine, unspecified: Secondary | ICD-10-CM

## 2019-10-15 LAB — BLADDER SCAN AMB NON-IMAGING

## 2019-10-15 NOTE — Progress Notes (Signed)
09/23/19 10:57 AM   Krista Cisneros 1940-08-05 [id number]  Referring provider: Sharyne Peach, MD [place],  [place] [address] Chief Complaint  Patient presents with  . Urinary Retention   Urological History - Went to the ED (03/12/2018) for urinary retention; Hospitalized (03/14/2018); Foley placed following +971ml PVR - CT (03/15/2018) revealed left sided nephrolithiasis w/o obstructive uropathy - UA (03/26/2018); Mixed flora, probable contamination - Foley removed the week of (03/31/2018) by home health nurse.  -Hospitalized 10/07/19 with UTI and constipation, found to be urinary retention. Had a foley catheter placed by urology PA. -CT on 10/07/19 noted small nonobstructing stone left kidney; no hydronephrosis -Constipation has resolved   HPI: Krista Cisneros is a 79 y.o. female who presents today for catheter removal/voiding trial  She had her Foley catheter removed yesterday morning.  She stated that she had two 16 ounce bottles of water, coffee and tea.  She hasdnot voided and she has not felt the urge to void.  Her PVR at that time was 106 mL.  Today, she states she has been urinating spontaneously.  Patient denies any modifying or aggravating factors.  Patient denies any gross hematuria, dysuria or suprapubic/flank pain.  Patient denies any fevers, chills, nausea or vomiting.   Her PVR today 307 cc.    PMH: Past Medical History:  Diagnosis Date  . Anxiety   . Arthritis   . CAD (coronary artery disease)    a. NSTEMI 12/19; b. LHC 04/10/18: pLAD 95%, mLAD 80%, m-dLCx 95%, OM3-1 lesion 40%, OM3-2 lesion 60%, mRCA 50%, EF 35-45%, successful PCI/DES x 2 to the LAD with recommended staged PCI of the LCx in a few weeks  . Cancer [facility])    Right Breast Cancer  . Chronic systolic CHF (congestive heart failure) ([place])    a. TTE 12/19: EF 30-35%, anteroseptal, anterior, and apical HK, Gr1DD, mild to mod MR, mildly dilated LA, RVSF nl  . COPD (chronic  obstructive pulmonary disease) ([place])   . Depression   . Dyspnea    with exertion  . GERD (gastroesophageal reflux disease)   . Hyperlipidemia   . Hypertension   . Personal history of chemotherapy 2018   chemo prior to lumpectomy of right breast  . Personal history of radiation therapy 2019   right breast ca    Surgical History: Past Surgical History:  Procedure Laterality Date  . ABDOMINAL HYSTERECTOMY  1990   Partial  . BREAST BIOPSY Right 10/04/2016   axilla lymph node (metastatic carcinoma) and axillay tail mass biopsy-invasive mammary carcinoma  . BREAST LUMPECTOMY Right 03/21/2017   chemo first, [facility] and metastatic LN  . BREAST LUMPECTOMY Right 05/21/2017   re-excision for clip  . BREAST LUMPECTOMY WITH NEEDLE LOCALIZATION Right 03/21/2017   Procedure: BREAST LUMPECTOMY WITH NEEDLE LOCALIZATION;  Surgeon: Clayburn Pert, MD;  Location: [facility] ORS;  Service: General;  Laterality: Right;  . CORONARY STENT INTERVENTION N/A 04/10/2018   Procedure: CORONARY STENT INTERVENTION;  Surgeon: Wellington Hampshire, MD;  Location: [place] CV LAB;  Service: Cardiovascular;  Laterality: N/A;  . CORONARY STENT INTERVENTION N/A 05/14/2018   Procedure: CORONARY STENT INTERVENTION;  Surgeon: Nelva Bush, MD;  Location: [place] CV LAB;  Service: Cardiovascular;  Laterality: N/A;  . DILATION AND CURETTAGE OF UTERUS    . INGUINAL HERNIA REPAIR Right 03/26/2017   Procedure: HERNIA REPAIR INGUINAL INCARCERATED;  Surgeon: Jules Husbands, MD;  Location: [facility] ORS;  Service: General;  Laterality: Right;  .  LEFT HEART CATH AND CORONARY ANGIOGRAPHY N/A 04/10/2018   Procedure: LEFT HEART CATH AND CORONARY ANGIOGRAPHY poss PCI;  Surgeon: Minna Merritts, MD;  Location: [place] CV LAB;  Service: Cardiovascular;  Laterality: N/A;  . PORTACATH PLACEMENT Left 10/24/2016   Procedure: INSERTION PORT-A-CATH;  Surgeon: [name], MD;  Location: [facility] ORS;  Service: General;  Laterality: Left;    . RE-EXCISION OF BREAST LUMPECTOMY Right 05/21/2017   Procedure: RE-EXCISION OF BREAST LUMPECTOMY;  Surgeon: Clayburn Pert, MD;  Location: [facility] ORS;  Service: General;  Laterality: Right;  . SENTINEL NODE BIOPSY Right 03/21/2017   Procedure: SENTINEL NODE BIOPSY;  Surgeon: Clayburn Pert, MD;  Location: [facility] ORS;  Service: General;  Laterality: Right;    Home Medications:  Allergies as of 10/16/2019      Reactions   Nsaids    Bleeding risk   Penicillins Anaphylaxis, Swelling, Rash   Has patient had a PCN reaction causing immediate rash, facial/tongue/throat swelling, SOB or lightheadedness with hypotension: Yes Has patient had a PCN reaction causing severe rash involving mucus membranes or skin necrosis: No Has patient had a PCN reaction that required hospitalization: No  Has patient had a PCN reaction occurring within the last 10 years: No If all of the above answers are "NO", then may proceed with Cephalosporin use.   Atorvastatin Other (See Comments)   Muscle Pain   Gabapentin Swelling   Ezetimibe Other (See Comments)   Muscle pain   Aspirin    GI bleeding risk      Medication List       Accurate as of October 16, 2019 10:57 AM. If you have any questions, ask your nurse or doctor.        Advair Diskus 500-50 MCG/DOSE Aepb Generic drug: Fluticasone-Salmeterol Inhale 1 puff into the lungs 2 (two) times daily.   albuterol 108 (90 Base) MCG/ACT inhaler Commonly known as: VENTOLIN HFA Inhale 2 puffs into the lungs every 6 (six) hours as needed for wheezing or shortness of breath.   aspirin EC 81 MG tablet Take 81 mg by mouth daily.   baclofen 10 MG tablet Commonly known as: LIORESAL Take 10 mg by mouth at bedtime.   Calcium-Vitamin D-Vitamin K 947-096-28 MG-UNT-MCG Tabs Take 1 tablet by mouth daily.   carvedilol 3.125 MG tablet Commonly known as: COREG Take 2 tablets (6.25 mg total) by mouth 2 (two) times daily.   cholecalciferol 25 MCG (1000 UNIT)  tablet Commonly known as: VITAMIN D3 Take 1,000 Units by mouth daily.   clopidogrel 75 MG tablet Commonly known as: PLAVIX Take 1 tablet (75 mg total) by mouth daily with breakfast.   Coenzyme Q10 300 MG Caps Take 300 mg by mouth daily.   DULoxetine 30 MG capsule Commonly known as: CYMBALTA Take 30 mg by mouth every morning.   enalapril 5 MG tablet Commonly known as: VASOTEC TAKE 1 TABLET BY MOUTH EVERY DAY   fluticasone 50 MCG/ACT nasal spray Commonly known as: FLONASE Place 2 sprays into the nose daily.   furosemide 20 MG tablet Commonly known as: Lasix Take 1 tablet (20 mg total) by mouth as needed for edema.   letrozole 2.5 MG tablet Commonly known as: FEMARA TAKE 1 TABLET BY MOUTH EVERY DAY   lidocaine-prilocaine cream Commonly known as: EMLA Apply 1 application as needed topically (for port access).   magnesium oxide 400 MG tablet Commonly known as: MAG-OX Take 400 mg by mouth daily.   melatonin 5 MG Tabs Take 10 mg  by mouth at bedtime as needed (sleep).   Melatonin-Pyridoxine 5-1 MG Tabs Take 1 tablet by mouth daily.   montelukast 10 MG tablet Commonly known as: SINGULAIR Take 10 mg by mouth at bedtime.   nitroGLYCERIN 0.4 MG SL tablet Commonly known as: NITROSTAT Place 1 tablet (0.4 mg total) under the tongue every 5 (five) minutes as needed for chest pain.   pantoprazole 40 MG tablet Commonly known as: PROTONIX Take 1 tablet (40 mg total) by mouth daily.   rosuvastatin 20 MG tablet Commonly known as: CRESTOR Take 1 tablet (20 mg total) by mouth daily at 6 PM.       Allergies:  Allergies  Allergen Reactions  . Nsaids     Bleeding risk  . Penicillins Anaphylaxis, Swelling and Rash    Has patient had a PCN reaction causing immediate rash, facial/tongue/throat swelling, SOB or lightheadedness with hypotension: Yes Has patient had a PCN reaction causing severe rash involving mucus membranes or skin necrosis: No Has patient had a PCN reaction  that required hospitalization: No  Has patient had a PCN reaction occurring within the last 10 years: No If all of the above answers are "NO", then may proceed with Cephalosporin use.   . Atorvastatin Other (See Comments)    Muscle Pain  . Gabapentin Swelling  . Ezetimibe Other (See Comments)    Muscle pain  . Aspirin     GI bleeding risk    Family History: Family History  Problem Relation Age of Onset  . Colon cancer Mother   . Breast cancer Neg Hx     Social History:  reports that she quit smoking about 31 years ago. Her smoking use included cigarettes. She smoked 0.50 packs per day. She has never used smokeless tobacco. She reports current alcohol use. She reports that she does not use drugs.   Physical Exam: BP 120/77   Pulse 70   Ht 5\' 5"  (1.651 m)   Wt 145 lb (65.8 kg)   BMI 24.13 kg/m   Constitutional:  Well nourished. Alert and oriented, No acute distress. HEENT: [place] AT, mask in place.  Trachea midline Cardiovascular: No clubbing, cyanosis, or edema. Respiratory: Normal respiratory effort, no increased work of breathing. Neurologic: Grossly intact, no focal deficits, moving all 4 extremities. Psychiatric: Normal mood and affect.   Assessment & Plan:    1. Urinary Retention  Patient with a moderate residual today.  She is not wanting a cath and is not a candidate for self cathing due to her anatomy.  We will start tamsulosin 0.4 mg daily and have her return in one month for OAB questionnaire and PVR.  She is advised to contact us or seek treatment in the emergency room for suprapubic pain or difficulty with urination.  [facility] [address], [place], [place] [address] [phone]

## 2019-10-15 NOTE — Progress Notes (Signed)
Catheter Removal  Patient is present today for a catheter removal.  6ml of water was drained from the balloon. A 14FR foley cath was removed from the bladder no complications were noted . Patient tolerated well.  Performed by: Krista Cisneros CMA Follow up/ Additional notes: This afternoon

## 2019-10-15 NOTE — Progress Notes (Signed)
09/23/19 1:51 PM   Krista Cisneros 07-26-1940 [id number]  Referring provider: Sharyne Peach, MD [place],   [id number] No chief complaint on file.  Urological History - Went to the ED (03/12/2018) for urinary retention; Hospitalized (03/14/2018); Foley placed following +935ml PVR - CT (03/15/2018) revealed left sided nephrolithiasis w/o obstructive uropathy - UA (03/26/2018); Mixed flora, probable contamination - Foley removed the week of (03/31/2018) by home health nurse.  -Hospitalized 10/07/19 with UTI and constipation, found to be urinary retention. Had a foley catheter placed by urology PA. -CT on 10/07/19 noted small nonobstructing stone left kidney; no hydronephrosis -Constipation has resolved   HPI: Krista Cisneros is a 79 y.o. female who presents today for catheter removal/voiding trial  She had her Foley catheter removed this morning.  She states that she has had two 16 ounce bottles of water, coffee and tea.  She has not voided and she has not felt the urge to void.  Her PVR at this time is 106 mL.  PMH: Past Medical History:  Diagnosis Date  . Anxiety   . Arthritis   . CAD (coronary artery disease)    a. NSTEMI 12/19; b. LHC 04/10/18: pLAD 95%, mLAD 80%, m-dLCx 95%, OM3-1 lesion 40%, OM3-2 lesion 60%, mRCA 50%, EF 35-45%, successful PCI/DES x 2 to the LAD with recommended staged PCI of the LCx in a few weeks  . Cancer [facility])    Right Breast Cancer  . Chronic systolic CHF (congestive heart failure) ([place])    a. TTE 12/19: EF 30-35%, anteroseptal, anterior, and apical HK, Gr1DD, mild to mod MR, mildly dilated LA, RVSF nl  . COPD (chronic obstructive pulmonary disease) ([place])   . Depression   . Dyspnea    with exertion  . GERD (gastroesophageal reflux disease)   . Hyperlipidemia   . Hypertension   . Personal history of chemotherapy 2018   chemo prior to lumpectomy of right breast  . Personal history of radiation therapy 2019   right  breast ca    Surgical History: Past Surgical History:  Procedure Laterality Date  . ABDOMINAL HYSTERECTOMY  1990   Partial  . BREAST BIOPSY Right 10/04/2016   axilla lymph node (metastatic carcinoma) and axillay tail mass biopsy-invasive mammary carcinoma  . BREAST LUMPECTOMY Right 03/21/2017   chemo first, [facility] and metastatic LN  . BREAST LUMPECTOMY Right 05/21/2017   re-excision for clip  . BREAST LUMPECTOMY WITH NEEDLE LOCALIZATION Right 03/21/2017   Procedure: BREAST LUMPECTOMY WITH NEEDLE LOCALIZATION;  Surgeon: Clayburn Pert, MD;  Location: [facility] ORS;  Service: General;  Laterality: Right;  . CORONARY STENT INTERVENTION N/A 04/10/2018   Procedure: CORONARY STENT INTERVENTION;  Surgeon: Wellington Hampshire, MD;  Location: [place] CV LAB;  Service: Cardiovascular;  Laterality: N/A;  . CORONARY STENT INTERVENTION N/A 05/14/2018   Procedure: CORONARY STENT INTERVENTION;  Surgeon: Nelva Bush, MD;  Location: [place] CV LAB;  Service: Cardiovascular;  Laterality: N/A;  . DILATION AND CURETTAGE OF UTERUS    . INGUINAL HERNIA REPAIR Right 03/26/2017   Procedure: HERNIA REPAIR INGUINAL INCARCERATED;  Surgeon: Jules Husbands, MD;  Location: [facility] ORS;  Service: General;  Laterality: Right;  . LEFT HEART CATH AND CORONARY ANGIOGRAPHY N/A 04/10/2018   Procedure: LEFT HEART CATH AND CORONARY ANGIOGRAPHY poss PCI;  Surgeon: Minna Merritts, MD;  Location:  CV LAB;  Service: Cardiovascular;  Laterality: N/A;  . PORTACATH PLACEMENT Left 10/24/2016   Procedure: INSERTION PORT-A-CATH;  Surgeon: [name], MD;  Location: [facility] ORS;  Service: General;  Laterality: Left;  . RE-EXCISION OF BREAST LUMPECTOMY Right 05/21/2017   Procedure: RE-EXCISION OF BREAST LUMPECTOMY;  Surgeon: Clayburn Pert, MD;  Location: [facility] ORS;  Service: General;  Laterality: Right;  . SENTINEL NODE BIOPSY Right 03/21/2017   Procedure: SENTINEL NODE BIOPSY;  Surgeon: Clayburn Pert, MD;   Location: [facility] ORS;  Service: General;  Laterality: Right;    Home Medications:  Allergies as of 10/15/2019      Reactions   Nsaids    Bleeding risk   Penicillins Anaphylaxis, Swelling, Rash   Has patient had a PCN reaction causing immediate rash, facial/tongue/throat swelling, SOB or lightheadedness with hypotension: Yes Has patient had a PCN reaction causing severe rash involving mucus membranes or skin necrosis: No Has patient had a PCN reaction that required hospitalization: No  Has patient had a PCN reaction occurring within the last 10 years: No If all of the above answers are "NO", then may proceed with Cephalosporin use.   Atorvastatin Other (See Comments)   Muscle Pain   Gabapentin Swelling   Ezetimibe Other (See Comments)   Muscle pain   Aspirin    GI bleeding risk      Medication List       Accurate as of October 15, 2019  1:51 PM. If you have any questions, ask your nurse or doctor.        Advair Diskus 500-50 MCG/DOSE Aepb Generic drug: Fluticasone-Salmeterol Inhale 1 puff into the lungs 2 (two) times daily.   albuterol 108 (90 Base) MCG/ACT inhaler Commonly known as: VENTOLIN HFA Inhale 2 puffs into the lungs every 6 (six) hours as needed for wheezing or shortness of breath.   aspirin EC 81 MG tablet Take 81 mg by mouth daily.   baclofen 10 MG tablet Commonly known as: LIORESAL Take 10 mg by mouth at bedtime.   Calcium-Vitamin D-Vitamin K 563-875-64 MG-UNT-MCG Tabs Take 1 tablet by mouth daily.   carvedilol 3.125 MG tablet Commonly known as: COREG Take 2 tablets (6.25 mg total) by mouth 2 (two) times daily.   cholecalciferol 25 MCG (1000 UNIT) tablet Commonly known as: VITAMIN D3 Take 1,000 Units by mouth daily.   clopidogrel 75 MG tablet Commonly known as: PLAVIX Take 1 tablet (75 mg total) by mouth daily with breakfast.   Coenzyme Q10 300 MG Caps Take 300 mg by mouth daily.   DULoxetine 30 MG capsule Commonly known as: CYMBALTA Take 30 mg  by mouth every morning.   enalapril 5 MG tablet Commonly known as: VASOTEC TAKE 1 TABLET BY MOUTH EVERY DAY   fluticasone 50 MCG/ACT nasal spray Commonly known as: FLONASE Place 2 sprays into the nose daily.   furosemide 20 MG tablet Commonly known as: Lasix Take 1 tablet (20 mg total) by mouth as needed for edema.   letrozole 2.5 MG tablet Commonly known as: FEMARA TAKE 1 TABLET BY MOUTH EVERY DAY   lidocaine-prilocaine cream Commonly known as: EMLA Apply 1 application as needed topically (for port access).   magnesium oxide 400 MG tablet Commonly known as: MAG-OX Take 400 mg by mouth daily.   melatonin 5 MG Tabs Take 10 mg by mouth at bedtime as needed (sleep).   Melatonin-Pyridoxine 5-1 MG Tabs Take 1 tablet by mouth daily.   montelukast 10 MG tablet Commonly known as: SINGULAIR Take 10 mg by mouth at bedtime.   nitroGLYCERIN 0.4 MG SL tablet Commonly known as: NITROSTAT Place 1  tablet (0.4 mg total) under the tongue every 5 (five) minutes as needed for chest pain.   pantoprazole 40 MG tablet Commonly known as: PROTONIX Take 1 tablet (40 mg total) by mouth daily.   rosuvastatin 20 MG tablet Commonly known as: CRESTOR Take 1 tablet (20 mg total) by mouth daily at 6 PM.       Allergies:  Allergies  Allergen Reactions  . Nsaids     Bleeding risk  . Penicillins Anaphylaxis, Swelling and Rash    Has patient had a PCN reaction causing immediate rash, facial/tongue/throat swelling, SOB or lightheadedness with hypotension: Yes Has patient had a PCN reaction causing severe rash involving mucus membranes or skin necrosis: No Has patient had a PCN reaction that required hospitalization: No  Has patient had a PCN reaction occurring within the last 10 years: No If all of the above answers are "NO", then may proceed with Cephalosporin use.   . Atorvastatin Other (See Comments)    Muscle Pain  . Gabapentin Swelling  . Ezetimibe Other (See Comments)    Muscle  pain  . Aspirin     GI bleeding risk    Family History: Family History  Problem Relation Age of Onset  . Colon cancer Mother   . Breast cancer Neg Hx     Social History:  reports that she quit smoking about 31 years ago. Her smoking use included cigarettes. She smoked 0.50 packs per day. She has never used smokeless tobacco. She reports current alcohol use. She reports that she does not use drugs.   Physical Exam: There were no vitals taken for this visit.  Constitutional:  Well nourished. Alert and oriented, No acute distress. HEENT: [place] AT, mask in place.  Trachea midline Cardiovascular: No clubbing, cyanosis, or edema. Respiratory: Normal respiratory effort, no increased work of breathing. Neurologic: Grossly intact, no focal deficits, moving all 4 extremities. Psychiatric: Normal mood and affect.   Assessment & Plan:    1. Urinary Retention  -Patient will return in the morning for repeat PVR to ensure her PVRs are not trending upwards and to reassess her voiding symptoms  [facility] [address] [place], [place] [address] [phone]

## 2019-10-15 NOTE — Progress Notes (Signed)
09/23/19 8:17 AM   Krista Cisneros 04-May-1940 [id number]  Referring provider: Sharyne Peach, MD [place],  [place] [address] Chief Complaint  Patient presents with  . Urinary Retention   Urological History - Went to the ED (03/12/2018) for urinary retention; Hospitalized (03/14/2018); Foley placed following +976ml PVR - CT (03/15/2018) revealed left sided nephrolithiasis w/o obstructive uropathy - UA (03/26/2018); Mixed flora, probable contamination - Foley removed the week of (03/31/2018) by home health nurse.   HPI: Krista Cisneros is a 79 y.o. female who presents today for catheter removal/voiding trial  -Hospitalized 10/07/19 with UTI and constipation, found to be urinary retention. Had a foley catheter placed by urology PA. -CT on 10/07/19 noted small nonobstructing stone left kidney; no hydronephrosis -Constipation has resolved   PMH: Past Medical History:  Diagnosis Date  . Anxiety   . Arthritis   . CAD (coronary artery disease)    a. NSTEMI 12/19; b. LHC 04/10/18: pLAD 95%, mLAD 80%, m-dLCx 95%, OM3-1 lesion 40%, OM3-2 lesion 60%, mRCA 50%, EF 35-45%, successful PCI/DES x 2 to the LAD with recommended staged PCI of the LCx in a few weeks  . Cancer [facility])    Right Breast Cancer  . Chronic systolic CHF (congestive heart failure) ([place])    a. TTE 12/19: EF 30-35%, anteroseptal, anterior, and apical HK, Gr1DD, mild to mod MR, mildly dilated LA, RVSF nl  . COPD (chronic obstructive pulmonary disease) ([place])   . Depression   . Dyspnea    with exertion  . GERD (gastroesophageal reflux disease)   . Hyperlipidemia   . Hypertension   . Personal history of chemotherapy 2018   chemo prior to lumpectomy of right breast  . Personal history of radiation therapy 2019   right breast ca    Surgical History: Past Surgical History:  Procedure Laterality Date  . ABDOMINAL HYSTERECTOMY  1990   Partial  . BREAST BIOPSY Right 10/04/2016   axilla lymph node  (metastatic carcinoma) and axillay tail mass biopsy-invasive mammary carcinoma  . BREAST LUMPECTOMY Right 03/21/2017   chemo first, [facility] and metastatic LN  . BREAST LUMPECTOMY Right 05/21/2017   re-excision for clip  . BREAST LUMPECTOMY WITH NEEDLE LOCALIZATION Right 03/21/2017   Procedure: BREAST LUMPECTOMY WITH NEEDLE LOCALIZATION;  Surgeon: Clayburn Pert, MD;  Location: [facility] ORS;  Service: General;  Laterality: Right;  . CORONARY STENT INTERVENTION N/A 04/10/2018   Procedure: CORONARY STENT INTERVENTION;  Surgeon: Wellington Hampshire, MD;  Location: [place] CV LAB;  Service: Cardiovascular;  Laterality: N/A;  . CORONARY STENT INTERVENTION N/A 05/14/2018   Procedure: CORONARY STENT INTERVENTION;  Surgeon: Nelva Bush, MD;  Location: [place] CV LAB;  Service: Cardiovascular;  Laterality: N/A;  . DILATION AND CURETTAGE OF UTERUS    . INGUINAL HERNIA REPAIR Right 03/26/2017   Procedure: HERNIA REPAIR INGUINAL INCARCERATED;  Surgeon: Jules Husbands, MD;  Location: [facility] ORS;  Service: General;  Laterality: Right;  . LEFT HEART CATH AND CORONARY ANGIOGRAPHY N/A 04/10/2018   Procedure: LEFT HEART CATH AND CORONARY ANGIOGRAPHY poss PCI;  Surgeon: Minna Merritts, MD;  Location: [place] CV LAB;  Service: Cardiovascular;  Laterality: N/A;  . PORTACATH PLACEMENT Left 10/24/2016   Procedure: INSERTION PORT-A-CATH;  Surgeon: [name], MD;  Location: [facility] ORS;  Service: General;  Laterality: Left;  . RE-EXCISION OF BREAST LUMPECTOMY Right 05/21/2017   Procedure: RE-EXCISION OF BREAST LUMPECTOMY;  Surgeon: Clayburn Pert, MD;  Location: [facility] ORS;  Service: General;  Laterality: Right;  . SENTINEL NODE BIOPSY Right 03/21/2017   Procedure: SENTINEL NODE BIOPSY;  Surgeon: Clayburn Pert, MD;  Location: [facility] ORS;  Service: General;  Laterality: Right;    Home Medications:  Allergies as of 10/15/2019      Reactions   Nsaids    Bleeding risk   Penicillins Anaphylaxis, Swelling,  Rash   Has patient had a PCN reaction causing immediate rash, facial/tongue/throat swelling, SOB or lightheadedness with hypotension: Yes Has patient had a PCN reaction causing severe rash involving mucus membranes or skin necrosis: No Has patient had a PCN reaction that required hospitalization: No  Has patient had a PCN reaction occurring within the last 10 years: No If all of the above answers are "NO", then may proceed with Cephalosporin use.   Atorvastatin Other (See Comments)   Muscle Pain   Gabapentin Swelling   Ezetimibe Other (See Comments)   Muscle pain   Aspirin    GI bleeding risk      Medication List       Accurate as of October 15, 2019  8:17 AM. If you have any questions, ask your nurse or doctor.        Advair Diskus 500-50 MCG/DOSE Aepb Generic drug: Fluticasone-Salmeterol Inhale 1 puff into the lungs 2 (two) times daily.   albuterol 108 (90 Base) MCG/ACT inhaler Commonly known as: VENTOLIN HFA Inhale 2 puffs into the lungs every 6 (six) hours as needed for wheezing or shortness of breath.   aspirin EC 81 MG tablet Take 81 mg by mouth daily.   baclofen 10 MG tablet Commonly known as: LIORESAL Take 10 mg by mouth at bedtime.   Calcium-Vitamin D-Vitamin K 836-629-47 MG-UNT-MCG Tabs Take 1 tablet by mouth daily.   carvedilol 3.125 MG tablet Commonly known as: COREG Take 2 tablets (6.25 mg total) by mouth 2 (two) times daily.   cholecalciferol 25 MCG (1000 UNIT) tablet Commonly known as: VITAMIN D3 Take 1,000 Units by mouth daily.   clopidogrel 75 MG tablet Commonly known as: PLAVIX Take 1 tablet (75 mg total) by mouth daily with breakfast.   Coenzyme Q10 300 MG Caps Take 300 mg by mouth daily.   DULoxetine 30 MG capsule Commonly known as: CYMBALTA Take 30 mg by mouth every morning.   enalapril 5 MG tablet Commonly known as: VASOTEC TAKE 1 TABLET BY MOUTH EVERY DAY   fluticasone 50 MCG/ACT nasal spray Commonly known as: FLONASE Place 2 sprays  into the nose daily.   furosemide 20 MG tablet Commonly known as: Lasix Take 1 tablet (20 mg total) by mouth as needed for edema.   letrozole 2.5 MG tablet Commonly known as: FEMARA TAKE 1 TABLET BY MOUTH EVERY DAY   lidocaine-prilocaine cream Commonly known as: EMLA Apply 1 application as needed topically (for port access).   magnesium oxide 400 MG tablet Commonly known as: MAG-OX Take 400 mg by mouth daily.   melatonin 5 MG Tabs Take 10 mg by mouth at bedtime as needed (sleep).   Melatonin-Pyridoxine 5-1 MG Tabs Take 1 tablet by mouth daily.   montelukast 10 MG tablet Commonly known as: SINGULAIR Take 10 mg by mouth at bedtime.   nitroGLYCERIN 0.4 MG SL tablet Commonly known as: NITROSTAT Place 1 tablet (0.4 mg total) under the tongue every 5 (five) minutes as needed for chest pain.   pantoprazole 40 MG tablet Commonly known as: PROTONIX Take 1 tablet (40 mg total) by mouth daily.   rosuvastatin 20 MG tablet Commonly  known as: CRESTOR Take 1 tablet (20 mg total) by mouth daily at 6 PM.       Allergies:  Allergies  Allergen Reactions  . Nsaids     Bleeding risk  . Penicillins Anaphylaxis, Swelling and Rash    Has patient had a PCN reaction causing immediate rash, facial/tongue/throat swelling, SOB or lightheadedness with hypotension: Yes Has patient had a PCN reaction causing severe rash involving mucus membranes or skin necrosis: No Has patient had a PCN reaction that required hospitalization: No  Has patient had a PCN reaction occurring within the last 10 years: No If all of the above answers are "NO", then may proceed with Cephalosporin use.   . Atorvastatin Other (See Comments)    Muscle Pain  . Gabapentin Swelling  . Ezetimibe Other (See Comments)    Muscle pain  . Aspirin     GI bleeding risk    Family History: Family History  Problem Relation Age of Onset  . Colon cancer Mother   . Breast cancer Neg Hx     Social History:  reports that  she quit smoking about 31 years ago. Her smoking use included cigarettes. She smoked 0.50 packs per day. She has never used smokeless tobacco. She reports current alcohol use. She reports that she does not use drugs.   Physical Exam: BP 121/77   Pulse 90   Ht 5\' 2"  (1.575 m)   Wt 145 lb (65.8 kg)   BMI 26.52 kg/m   Constitutional:  Alert and oriented, No acute distress. HEENT: [place] AT, moist mucus membranes.  Trachea midline, no masses. Cardiovascular: No clubbing, cyanosis, or edema. Respiratory: Normal respiratory effort, no increased work of breathing. Skin: No rashes, bruises or suspicious lesions. Neurologic: Grossly intact, no focal deficits, moving all 4 extremities. Psychiatric: Normal mood and affect.   Assessment & Plan:    1. Urinary Retention  -Catheter removed and she has a follow up appointment with [facility] this afternoon for bladder scan for PVR.  [place] [address], [place], [place] [address] [phone]  I, Joneen Boers Peace, am acting as a Education administrator for Dr. Nicki Reaper C. Kannen Moxey.  I have reviewed the above documentation for accuracy and completeness, and I agree with the above.   Abbie Sons, MD

## 2019-10-16 ENCOUNTER — Ambulatory Visit: Payer: Medicare Other | Admitting: Urology

## 2019-10-16 ENCOUNTER — Encounter: Payer: Self-pay | Admitting: Urology

## 2019-10-16 VITALS — BP 120/77 | HR 70 | Ht 65.0 in | Wt 145.0 lb

## 2019-10-16 DIAGNOSIS — R339 Retention of urine, unspecified: Secondary | ICD-10-CM

## 2019-10-16 LAB — BLADDER SCAN AMB NON-IMAGING: Scan Result: 307

## 2019-10-16 MED ORDER — TAMSULOSIN HCL 0.4 MG PO CAPS
0.4000 mg | ORAL_CAPSULE | Freq: Every day | ORAL | 3 refills | Status: DC
Start: 2019-10-16 — End: 2019-11-14

## 2019-10-21 ENCOUNTER — Other Ambulatory Visit: Payer: Self-pay

## 2019-10-21 ENCOUNTER — Encounter: Payer: Self-pay | Admitting: Family

## 2019-10-21 ENCOUNTER — Ambulatory Visit: Payer: Medicare Other | Admitting: Family

## 2019-10-21 VITALS — BP 110/60 | HR 81 | Ht 62.0 in | Wt 149.4 lb

## 2019-10-21 DIAGNOSIS — E785 Hyperlipidemia, unspecified: Secondary | ICD-10-CM | POA: Diagnosis not present

## 2019-10-21 DIAGNOSIS — I25118 Atherosclerotic heart disease of native coronary artery with other forms of angina pectoris: Secondary | ICD-10-CM

## 2019-10-21 DIAGNOSIS — I255 Ischemic cardiomyopathy: Secondary | ICD-10-CM

## 2019-10-21 DIAGNOSIS — I5042 Chronic combined systolic (congestive) and diastolic (congestive) heart failure: Secondary | ICD-10-CM | POA: Diagnosis not present

## 2019-10-21 DIAGNOSIS — I2510 Atherosclerotic heart disease of native coronary artery without angina pectoris: Secondary | ICD-10-CM

## 2019-10-21 DIAGNOSIS — N179 Acute kidney failure, unspecified: Secondary | ICD-10-CM

## 2019-10-21 NOTE — Progress Notes (Signed)
Office Visit    Patient Name: Krista Cisneros Date of Encounter: 10/21/2019  Primary Care Provider:  Sharyne Peach, MD2 Primary Cardiologist:  Ida Rogue, MD Electrophysiologist:  None   Chief Complaint    Krista Cisneros is a 79 y.o. female with a hx of CAD s/p NSTEMI 03/2018 s/p PCI/DESx2 to LAD, ICM/HFrEF, HTN, HLD, breast cancer s/p chemoradiation, GERD presents today for hospital follow up.  Past Medical History    Past Medical History:  Diagnosis Date   Anxiety    Arthritis    CAD (coronary artery disease)    a. NSTEMI 12/19; b. LHC 04/10/18: pLAD 95%, mLAD 80%, m-dLCx 95%, OM3-1 lesion 40%, OM3-2 lesion 60%, mRCA 50%, EF 35-45%, successful PCI/DES x 2 to the LAD with recommended staged PCI of the LCx in a few weeks   Cancer ([facility])    Right Breast Cancer   Chronic systolic CHF (congestive heart failure) ([place])    a. TTE 12/19: EF 30-35%, anteroseptal, anterior, and apical HK, Gr1DD, mild to mod MR, mildly dilated LA, RVSF nl   COPD (chronic obstructive pulmonary disease) ([facility])    Depression    Dyspnea    with exertion   GERD (gastroesophageal reflux disease)    Hyperlipidemia    Hypertension    Personal history of chemotherapy 2018   chemo prior to lumpectomy of right breast   Personal history of radiation therapy 2019   right breast ca   Past Surgical History:  Procedure Laterality Date   ABDOMINAL HYSTERECTOMY  1990   Partial   BREAST BIOPSY Right 10/04/2016   axilla lymph node (metastatic carcinoma) and axillay tail mass biopsy-invasive mammary carcinoma   BREAST LUMPECTOMY Right 03/21/2017   chemo first, [facility] and metastatic LN   BREAST LUMPECTOMY Right 05/21/2017   re-excision for clip   BREAST LUMPECTOMY WITH NEEDLE LOCALIZATION Right 03/21/2017   Procedure: BREAST LUMPECTOMY WITH NEEDLE LOCALIZATION;  Surgeon: Clayburn Pert, MD;  Location: [facility] ORS;  Service: General;  Laterality: Right;   CORONARY STENT INTERVENTION N/A  04/10/2018   Procedure: CORONARY STENT INTERVENTION;  Surgeon: Wellington Hampshire, MD;  Location: [place] CV LAB;  Service: Cardiovascular;  Laterality: N/A;   CORONARY STENT INTERVENTION N/A 05/14/2018   Procedure: CORONARY STENT INTERVENTION;  Surgeon: Nelva Bush, MD;  Location: [place] CV LAB;  Service: Cardiovascular;  Laterality: N/A;   DILATION AND CURETTAGE OF UTERUS     INGUINAL HERNIA REPAIR Right 03/26/2017   Procedure: HERNIA REPAIR INGUINAL INCARCERATED;  Surgeon: Jules Husbands, MD;  Location: [facility];  Service: General;  Laterality: Right;   LEFT HEART CATH AND CORONARY ANGIOGRAPHY N/A 04/10/2018   Procedure: LEFT HEART CATH AND CORONARY ANGIOGRAPHY poss PCI;  Surgeon: Minna Merritts, MD;  Location: [place] CV LAB;  Service: Cardiovascular;  Laterality: N/A;   PORTACATH PLACEMENT Left 10/24/2016   Procedure: INSERTION PORT-A-CATH;  Surgeon: [name], MD;  Location: [facility] ORS;  Service: General;  Laterality: Left;   RE-EXCISION OF BREAST LUMPECTOMY Right 05/21/2017   Procedure: RE-EXCISION OF BREAST LUMPECTOMY;  Surgeon: Clayburn Pert, MD;  Location: [facility] OR[facility];  Service: General;  Laterality: Right;   SENTINEL NODE BIOPSY Right 03/21/2017   Procedure: SENTINEL NODE BIOPSY;  Surgeon: Clayburn Pert, MD;  Location: [facility] OR[facility];  Service: General;  Laterality: Right;    Allergies  Allergies  Allergen Reactions   Nsaids     Bleeding risk   Penicillins Anaphylaxis, Swelling and Rash    Has patient had  a PCN reaction causing immediate rash, facial/tongue/throat swelling, SOB or lightheadedness with hypotension: Yes Has patient had a PCN reaction causing severe rash involving mucus membranes or skin necrosis: No Has patient had a PCN reaction that required hospitalization: No  Has patient had a PCN reaction occurring within the last 10 years: No If all of the above answers are "NO", then may proceed with Cephalosporin use.    Atorvastatin Other  (See Comments)    Muscle Pain   Gabapentin Swelling   Ezetimibe Other (See Comments)    Muscle pain   Aspirin     GI bleeding risk    History of Present Illness    Krista Cisneros is a 79 y.o. female with a hx of CAD s/p NSTEMI 03/2018 s/p PCI/DESx2 to LAD and staged DES to LCx, ICM/HFrEF, HTN, HLD, breast cancer s/p chemoradiation, GERD, tobacco use. She was last seen while hospitalized.  Admitted 03/2018 with NSTEMI. Echo 04/10/18 with LVEF 30-35%, hypokinesis of anteroseptal, anterior, apical myocardium, gr1DD, LA mildly dilated, mild to moderate MR. Cardiac catheterization 04/10/18 with prox LAD 95% and mid LAD 80% with mid to distal Cx 95%, prox OM3 large vessel 95%, mid RCA 50%. Underwent DESx2 to LAD. Underwent staged intervention with DES to LCx 05/14/18.   Echo 08/28/18 with improvement in LVEF from 35 % to 50-55%, septal wall hypokinesis, gr1DD, RV normal size and function, no significant valvular abnormalities.  Seen in clinic 09/03/19 noting chest pain on and off for one month. She was started on Imdur.  Presented to [facility] ED 10/07/19 with constipation and vague abdominal pain. Elevated troponin likely due to supply-demand mismatch. Diagnosed with sepsis, AKI, constipation. Echo 10/09/19 LVEF 50-55%, nor RWMA, gr1DD, no significant valvular abnormalities. Lexiscan myoview 10/10/19 low risk scan with no significant ischemia. She was recommended to stop Imdur, potassium, and spironolactone due to AAKI at time of hospital discharge.  She has been following with urology for urinary retention and was started on Flomax. She does note it is making her feel nauseous for a few hours after taking.   Present today with her family member. Reports feeling overall well since hospital discharge. No recurrent chest pain, pressure, rightness. She has resumed her spironolactone as she did not understand to stop at hospital discharge. Reports no shortness of breath at rest. Endorses stable dyspnea on  exertion. We reviewed her testing performed during her hospitalization in detail.   EKGs/Labs/Other Studies Reviewed:   The following studies were reviewed today:  EKG:  EKG is ordered today.  The ekg ordered today demonstrates NSR 81 bpm with stable TWI anterolateral leads.  Recent Labs: 10/07/2019: ALT 16; Magnesium 3.1 10/10/2019: BUN 20; Creatinine, Ser 1.10; Hemoglobin 11.3; Platelets 211; Potassium 4.0; Sodium 138  Recent Lipid Panel    Component Value Date/Time   CHOL 189 04/10/2018 0821   TRIG 77 04/10/2018 0821   HDL 40 (L) 04/10/2018 0821   CHOLHDL 4.7 04/10/2018 0821   VLDL 15 04/10/2018 0821   LDLCALC 134 (H) 04/10/2018 0821    Home Medications   Current Meds  Medication Sig   ADVAIR DISKUS 500-50 MCG/DOSE AEPB Inhale 1 puff into the lungs 2 (two) times daily.    albuterol (PROVENTIL HFA;VENTOLIN HFA) 108 (90 Base) MCG/ACT inhaler Inhale 2 puffs into the lungs every 6 (six) hours as needed for wheezing or shortness of breath.   aspirin EC 81 MG tablet Take 81 mg by mouth daily.   baclofen (LIORESAL) 10 MG tablet Take 10 mg  by mouth at bedtime.   Calcium-Vitamin D-Vitamin K 703-500-93 MG-UNT-MCG TABS Take 1 tablet by mouth daily.   carvedilol (COREG) 3.125 MG tablet Take 2 tablets (6.25 mg total) by mouth 2 (two) times daily.   cholecalciferol (VITAMIN D3) 25 MCG (1000 UT) tablet Take 1,000 Units by mouth daily.   clopidogrel (PLAVIX) 75 MG tablet Take 1 tablet (75 mg total) by mouth daily with breakfast.   Coenzyme Q10 300 MG CAPS Take 300 mg by mouth daily.    DULoxetine (CYMBALTA) 30 MG capsule Take 30 mg by mouth every morning.    enalapril (VASOTEC) 5 MG tablet Take 5 mg by mouth daily.   fluticasone (FLONASE) 50 MCG/ACT nasal spray Place 2 sprays into the nose daily.   furosemide (LASIX) 20 MG tablet Take 1 tablet (20 mg total) by mouth as needed for edema.   letrozole (FEMARA) 2.5 MG tablet Take 2.5 mg by mouth daily.   lidocaine-prilocaine  (EMLA) cream Apply 1 application as needed topically (for port access).   magnesium oxide (MAG-OX) 400 MG tablet Take 400 mg by mouth daily.   Melatonin 5 MG TABS Take 10 mg by mouth at bedtime as needed (sleep).   Melatonin-Pyridoxine 5-1 MG TABS Take 1 tablet by mouth daily.   montelukast (SINGULAIR) 10 MG tablet Take 10 mg by mouth at bedtime.    nitroGLYCERIN (NITROSTAT) 0.4 MG SL tablet Place 1 tablet (0.4 mg total) under the tongue every 5 (five) minutes as needed for chest pain.   pantoprazole (PROTONIX) 40 MG tablet Take 1 tablet (40 mg total) by mouth daily.   rosuvastatin (CRESTOR) 20 MG tablet Take 1 tablet (20 mg total) by mouth daily at 6 PM.   spironolactone (ALDACTONE) 25 MG tablet Take 25 mg by mouth daily.   tamsulosin (FLOMAX) 0.4 MG CAPS capsule Take 1 capsule (0.4 mg total) by mouth daily.      Review of Systems      Review of Systems  Constitutional: Negative for chills, fever and malaise/fatigue.  Cardiovascular: Positive for dyspnea on exertion. Negative for chest pain, leg swelling, near-syncope, orthopnea, palpitations and syncope.  Respiratory: Negative for cough, shortness of breath and wheezing.   Gastrointestinal: Negative for nausea and vomiting.  Neurological: Negative for dizziness, light-headedness and weakness.   All other systems reviewed and are otherwise negative except as noted above.  Physical Exam    VS:  BP 110/60 (BP Location: Left Arm, Patient Position: Sitting, Cuff Size: Normal)    Pulse 81    Ht 5\' 2"  (1.575 m)    Wt 149 lb 6 oz (67.8 kg)    SpO2 93%    BMI 27.32 kg/m  , BMI Body mass index is 27.32 kg/m. GEN: Well nourished, well developed, in no acute distress. HEENT: normal. Neck: Supple, no JVD, carotid bruits, or masses. Cardiac: RRR, no murmurs, rubs, or gallops. No clubbing, cyanosis, edema.  Radials/PT 2+ and equal bilaterally.  Respiratory:  Respirations regular and unlabored, clear to auscultation bilaterally. GI:  Soft, nontender, nondistended, BS + x 4. MS: No deformity or atrophy. Skin: Warm and dry, no rash. Neuro:  Strength and sensation are intact. Psych: Normal affect.  Assessment & Plan    1. CAD - Recent admission with low risk Lexiscan Myoview.  Present GDMT includes aspirin, beta blocker, plavix, statin, PRN nitroglycerin. Reports no bleeding with DAPT. Low sodium diet recommended. Regular cardiovascular exercise encouraged.    2. ICM/HFrEF - Echo 09/2019 LVEF 50-55%, no RWMA, gr1DD, no  significant valvular abnormalities. Euvolemic and well compensated today. NYHA II with DOE. GDMT includes Coreg, Enalapril 5mg , Lasix 20mg , Spironolactone 25mg  daily. Spironolactone was discontinued after recent discharge, but she has been taking at home - will reassess BMP prior to making recommendations.   3. HLD, LDL goal <70 - 11/2018 LDL 51. Continue rosuvastatin 20mg  daily.   4. GERD - Reports well controlled on Pantoprazole 40mg  daily.  5. AKI - Noted during recent admission. Careful titration of diuretics and antihypertensives.   Disposition: F/u in 3 mos with Dr. Rockey Situ or APP.   Loel Dubonnet, NP 10/21/2019, 9:59 AM

## 2019-10-21 NOTE — Patient Instructions (Addendum)
Medication Instructions:  No medication changes today. We may change your Spironolactone depending on the results of your labs.   *If you need a refill on your cardiac medications before your next appointment, please call your pharmacy*  Lab Work: Your physician recommends that you return for lab work today: BMET  If you have labs (blood work) drawn today and your tests are completely normal, you will receive your results only by: Marland Kitchen MyChart Message (if you have MyChart) OR . A paper copy in the mail If you have any lab test that is abnormal or we need to change your treatment, we will call you to review the results.   Testing/Procedures: Your EKG today showed normal sinus rhythm. This is a stable result.  Your echocardiogram in the hospital showed normal pumping function.   Your stress test in the hospital showed no blockages.  Follow-Up: At [facility], you and your health needs are our priority.  As part of our continuing mission to provide you with exceptional heart care, we have created designated Provider Care Teams.  These Care Teams include your primary Cardiologist (physician) and Advanced Practice Providers (APPs -  Physician Assistants and Nurse Practitioners) who all work together to provide you with the care you need, when you need it.  We recommend signing up for the patient portal called "MyChart".  Sign up information is provided on this After Visit Summary.  MyChart is used to connect with patients for Virtual Visits (Telemedicine).  Patients are able to view lab/test results, encounter notes, upcoming appointments, etc.  Non-urgent messages can be sent to your provider as well.   To learn more about what you can do with MyChart, go to [email or web address].    Your next appointment:   3 month(s)  The format for your next appointment:   In Person  Provider:     You may see Ida Rogue, MD or Laurann Montana, NP

## 2019-10-22 ENCOUNTER — Telehealth: Payer: Self-pay

## 2019-10-22 DIAGNOSIS — I5022 Chronic systolic (congestive) heart failure: Secondary | ICD-10-CM

## 2019-10-22 LAB — BASIC METABOLIC PANEL
BUN/Creatinine Ratio: 14 (ref 12–28)
BUN: 20 mg/dL (ref 8–27)
CO2: 25 mmol/L (ref 20–29)
Calcium: 9.3 mg/dL (ref 8.7–10.3)
Chloride: 104 mmol/L (ref 96–106)
Creatinine, Ser: 1.48 mg/dL — ABNORMAL HIGH (ref 0.57–1.00)
GFR calc Af Amer: 39 mL/min/{1.73_m2} — ABNORMAL LOW (ref >59)
GFR calc non Af Amer: 34 mL/min/{1.73_m2} — ABNORMAL LOW (ref >59)
Glucose: 139 mg/dL — ABNORMAL HIGH (ref 65–99)
Potassium: 5.3 mmol/L — ABNORMAL HIGH (ref 3.5–5.2)
Sodium: 142 mmol/L (ref 134–144)

## 2019-10-22 NOTE — Telephone Encounter (Signed)
2nd attempt to contact the patient. [email or web address].

## 2019-10-22 NOTE — Telephone Encounter (Signed)
Patient called stating tamsulosin has been making her sick. She has been having dizziness and been sick to her stomach. She did not take the medication today

## 2019-10-22 NOTE — Telephone Encounter (Signed)
Is Krista Cisneros able to urinate at this time?  If not, she will need to follow-up sooner if she cannot tolerate the tamsulosin.

## 2019-10-22 NOTE — Telephone Encounter (Signed)
-----   Message from Loel Dubonnet, NP sent at 10/22/2019  7:39 AM EDT ----- Kidney function mildly decreased and potassium mildly elevated. Recommend she stop Spironolactone. Repeat BMP in 1 week.   Can we please get her set up for a follow up in 4-6 weeks (earlier than previously recommended) so we can reassess her HF meds?

## 2019-10-22 NOTE — Telephone Encounter (Signed)
Called to give the patient lab results and Laurann Montana, NP recommendation. [email or web address].

## 2019-10-22 NOTE — Telephone Encounter (Signed)
Patient made aware of lab results and Citlin Walker, NP recommendation. -Stop spironolactone - Repeat Bmet in 1 week ([facility]) - Follow appt scheduled for 11/17/19 @ 2:30pm with Laurann Montana, NP.  Patient verbalized understanding.

## 2019-10-26 NOTE — Progress Notes (Signed)
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  INTERVAL HISTORY: Patient returns to clinic today for repeat laboratory work patient returns to clinic today for repeat laboratory work and further evaluation.  She has not complained of any weakness or fatigue today.  She continues to tolerate letrozole well without significant side effects.  She has no neurologic complaints. She denies any recent fevers or illnesses.  She denies any chest pain, shortness of breath, cough, or hemoptysis.  She denies any nausea, vomiting, constipation, or diarrhea.  She denies any melena or hematochezia.  She has no urinary complaints.  Patient feels at her baseline offers no specific complaints today.  REVIEW OF SYSTEMS:   Review of Systems  Constitutional: Negative.  Negative for fever, malaise/fatigue and weight loss.  Respiratory: Negative.  Negative for cough and shortness of breath.   Cardiovascular: Negative.  Negative for chest pain and leg swelling.  Gastrointestinal: Negative.  Negative for abdominal pain, constipation, diarrhea and nausea.  Genitourinary: Negative.  Negative for dysuria and hematuria.  Musculoskeletal: Negative.  Negative for joint pain.  Skin: Negative.  Negative for rash.  Neurological: Negative.  Negative for sensory change, focal weakness, weakness and headaches.   Psychiatric/Behavioral: Negative.  The patient is not nervous/anxious.     As per HPI. Otherwise, a complete review of systems is negative.  PAST MEDICAL HISTORY: Past Medical History:  Diagnosis Date  . Anxiety   . Arthritis   . CAD (coronary artery disease)    a. NSTEMI 12/19; b. LHC 04/10/18: pLAD 95%, mLAD 80%, m-dLCx 95%, OM3-1 lesion 40%, OM3-2 lesion 60%, mRCA 50%, EF 35-45%, successful PCI/DES x 2 to the LAD with recommended staged PCI of the LCx in a few weeks  . Cancer [facility])    Right Breast Cancer  . Chronic systolic CHF (congestive heart failure) ([place])    a. TTE 12/19: EF 30-35%, anteroseptal, anterior, and apical HK, Gr1DD, mild to mod MR, mildly dilated LA, RVSF nl  . COPD (chronic obstructive pulmonary disease) ([place])   . Depression   . Dyspnea    with exertion  . GERD (gastroesophageal reflux disease)   . Hyperlipidemia   . Hypertension   . Personal history of chemotherapy 2018   chemo prior to lumpectomy of right breast  . Personal history of radiation therapy 2019   right breast ca    PAST SURGICAL HISTORY: Past Surgical History:  Procedure Laterality Date  . ABDOMINAL HYSTERECTOMY  1990   Partial  . BREAST BIOPSY Right 10/04/2016   axilla lymph node (metastatic carcinoma) and axillay tail mass biopsy-invasive mammary carcinoma  . BREAST LUMPECTOMY Right 03/21/2017   chemo first, [facility] and metastatic LN  . BREAST LUMPECTOMY Right 05/21/2017   re-excision for clip  . BREAST LUMPECTOMY WITH NEEDLE LOCALIZATION Right 03/21/2017   Procedure: BREAST LUMPECTOMY WITH NEEDLE LOCALIZATION;  Surgeon: Clayburn Pert, MD;  Location: [facility] ORS;  Service: General;  Laterality: Right;  . CORONARY STENT INTERVENTION N/A 04/10/2018  Procedure: CORONARY STENT INTERVENTION;  Surgeon: Wellington Hampshire, MD;  Location: [place] CV LAB;  Service: Cardiovascular;  Laterality: N/A;  . CORONARY STENT INTERVENTION N/A 05/14/2018   Procedure: CORONARY STENT INTERVENTION;   Surgeon: Nelva Bush, MD;  Location: [place] CV LAB;  Service: Cardiovascular;  Laterality: N/A;  . DILATION AND CURETTAGE OF UTERUS    . INGUINAL HERNIA REPAIR Right 03/26/2017   Procedure: HERNIA REPAIR INGUINAL INCARCERATED;  Surgeon: Jules Husbands, MD;  Location: [facility] ORS;  Service: General;  Laterality: Right;  . LEFT HEART CATH AND CORONARY ANGIOGRAPHY N/A 04/10/2018   Procedure: LEFT HEART CATH AND CORONARY ANGIOGRAPHY poss PCI;  Surgeon: Minna Merritts, MD;  Location: [place] CV LAB;  Service: Cardiovascular;  Laterality: N/A;  . PORTACATH PLACEMENT Left 10/24/2016   Procedure: INSERTION PORT-A-CATH;  Surgeon: [name], MD;  Location: [facility] ORS;  Service: General;  Laterality: Left;  . RE-EXCISION OF BREAST LUMPECTOMY Right 05/21/2017   Procedure: RE-EXCISION OF BREAST LUMPECTOMY;  Surgeon: Clayburn Pert, MD;  Location: [facility] ORS;  Service: General;  Laterality: Right;  . SENTINEL NODE BIOPSY Right 03/21/2017   Procedure: SENTINEL NODE BIOPSY;  Surgeon: Clayburn Pert, MD;  Location: [facility] ORS;  Service: General;  Laterality: Right;    FAMILY HISTORY: Family History  Problem Relation Age of Onset  . Colon cancer Mother   . Breast cancer Neg Hx     ADVANCED DIRECTIVES (Y/N):  N  HEALTH MAINTENANCE: Social History   Tobacco Use  . Smoking status: Former Smoker    Packs/day: 0.50    Types: Cigarettes    Quit date: 07/23/1988    Years since quitting: 31.2  . Smokeless tobacco: Never Used  Vaping Use  . Vaping Use: Never used  Substance Use Topics  . Alcohol use: Yes    Comment: rare  . Drug use: No     Colonoscopy:  PAP:  Bone density:  Lipid panel:  Allergies  Allergen Reactions  . Nsaids     Bleeding risk  . Penicillins Anaphylaxis, Swelling and Rash    Has patient had a PCN reaction causing immediate rash, facial/tongue/throat swelling, SOB or lightheadedness with hypotension: Yes Has patient had a PCN reaction causing severe rash  involving mucus membranes or skin necrosis: No Has patient had a PCN reaction that required hospitalization: No  Has patient had a PCN reaction occurring within the last 10 years: No If all of the above answers are "NO", then may proceed with Cephalosporin use.   . Tamsulosin Nausea Only  . Atorvastatin Other (See Comments)    Muscle Pain  . Gabapentin Swelling  . Ezetimibe Other (See Comments)    Muscle pain  . Aspirin     GI bleeding risk    Current Outpatient Medications  Medication Sig Dispense Refill  . ADVAIR DISKUS 500-50 MCG/DOSE AEPB Inhale 1 puff into the lungs 2 (two) times daily.     Marland Kitchen albuterol (PROVENTIL HFA;VENTOLIN HFA) 108 (90 Base) MCG/ACT inhaler Inhale 2 puffs into the lungs every 6 (six) hours as needed for wheezing or shortness of breath.    Marland Kitchen aspirin EC 81 MG tablet Take 81 mg by mouth daily.    . baclofen (LIORESAL) 10 MG tablet Take 10 mg by mouth at bedtime.  9  . Calcium-Vitamin D-Vitamin K 470-962-83 MG-UNT-MCG TABS Take 1 tablet by mouth daily.    . carvedilol (COREG) 3.125 MG tablet Take 2 tablets (6.25 mg total) by mouth 2 (two) times daily.  30 tablet 1  . cholecalciferol (VITAMIN D3) 25 MCG (1000 UT) tablet Take 1,000 Units by mouth daily.    . clopidogrel (PLAVIX) 75 MG tablet Take 1 tablet (75 mg total) by mouth daily with breakfast. 90 tablet 4  . DULoxetine (CYMBALTA) 30 MG capsule Take 30 mg by mouth every morning.   11  . fluticasone (FLONASE) 50 MCG/ACT nasal spray Place 2 sprays into the nose daily.    . furosemide (LASIX) 20 MG tablet Take 1 tablet (20 mg total) by mouth as needed for edema. 30 tablet 11  . isosorbide mononitrate (IMDUR) 30 MG 24 hr tablet Take by mouth.    . letrozole (FEMARA) 2.5 MG tablet TAKE 1 TABLET BY MOUTH EVERY DAY (Patient taking differently: Take 2.5 mg by mouth daily. ) 90 tablet 3  . lidocaine-prilocaine (EMLA) cream Apply 1 application as needed topically (for port access).    . magnesium oxide (MAG-OX) 400 MG  tablet Take 400 mg by mouth daily.    . Melatonin 5 MG TABS Take 10 mg by mouth at bedtime as needed (sleep).    . Melatonin-Pyridoxine 5-1 MG TABS Take 1 tablet by mouth daily.    . montelukast (SINGULAIR) 10 MG tablet Take 10 mg by mouth at bedtime.     . nitroGLYCERIN (NITROSTAT) 0.4 MG SL tablet Place 1 tablet (0.4 mg total) under the tongue every 5 (five) minutes as needed for chest pain. 25 tablet 6  . pantoprazole (PROTONIX) 40 MG tablet Take 1 tablet (40 mg total) by mouth daily. 90 tablet 3  . rosuvastatin (CRESTOR) 20 MG tablet TAKE 1 TABLET BY MOUTH EVERY DAY AT 6PM 90 tablet 0  . Coenzyme Q10 300 MG CAPS Take 300 mg by mouth daily.  (Patient not taking: Reported on 10/31/2019)    . tamsulosin (FLOMAX) 0.4 MG CAPS capsule Take 1 capsule (0.4 mg total) by mouth daily. (Patient not taking: Reported on 10/31/2019) 30 capsule 3   No current facility-administered medications for this visit.   Facility-Administered Medications Ordered in Other Visits  Medication Dose Route Frequency Provider Last Rate Last Admin  . heparin lock flush 100 unit/mL  500 Units Intravenous Once Lloyd Huger, MD      . ondansetron (ZOFRAN) 8 mg in sodium chloride 0.9 % 50 mL IVPB   Intravenous Once Lloyd Huger, MD        OBJECTIVE: Vitals:   10/31/19 1312  BP: (!) 147/70  Pulse: 79  Temp: 98.9 F (37.2 C)  SpO2: 96%     Body mass index is 26.96 kg/m.    ECOG FS:0 - Asymptomatic  General: Well-developed, well-nourished, no acute distress. Eyes: Pink conjunctiva, anicteric sclera. HEENT: Normocephalic, moist mucous membranes. Breasts: Exam deferred today. Lungs: No audible wheezing or coughing. Heart: Regular rate and rhythm. Abdomen: Soft, nontender, no obvious distention. Musculoskeletal: No edema, cyanosis, or clubbing. Neuro: Alert, answering all questions appropriately. Cranial nerves grossly intact. Skin: No rashes or petechiae noted. Psych: Normal affect.   LAB RESULTS:  Lab  Results  Component Value Date   NA 140 10/31/2019   K 4.3 10/31/2019   CL 105 10/31/2019   CO2 26 10/31/2019   GLUCOSE 112 (H) 10/31/2019   BUN 27 (H) 10/31/2019   CREATININE 1.31 (H) 10/31/2019   CALCIUM 9.0 10/31/2019   PROT 7.6 10/07/2019   ALBUMIN 3.3 (L) 10/07/2019   AST 30 10/07/2019   ALT 16 10/07/2019   ALKPHOS 59 10/07/2019   BILITOT 0.8  10/07/2019   GFRNONAA 39 (L) 10/31/2019   GFRAA 45 (L) 10/31/2019    Lab Results  Component Value Date   WBC 10.0 10/31/2019   NEUTROABS 5.7 10/31/2019   HGB 12.4 10/31/2019   HCT 39.8 10/31/2019   MCV 86.5 10/31/2019   PLT 336 10/31/2019   Lab Results  Component Value Date   IRON 37 10/31/2019   TIBC 353 10/31/2019   IRONPCTSAT 11 10/31/2019   Lab Results  Component Value Date   FERRITIN 24 10/31/2019     STUDIES: CT ABDOMEN PELVIS WO CONTRAST  Result Date: 10/07/2019 CLINICAL DATA:  Abdominal distension, nausea and vomiting. EXAM: CT ABDOMEN AND PELVIS WITHOUT CONTRAST TECHNIQUE: Multidetector CT imaging of the abdomen and pelvis was performed following the standard protocol without IV contrast. COMPARISON:  CT abdomen and pelvis 03/26/2017. FINDINGS: Lower chest: Extensive emphysematous disease is present in the lung bases. No pleural or pericardial effusion. Heart size is normal. Calcific coronary artery disease noted. Hepatobiliary: No focal liver abnormality is seen. No gallstones, gallbladder wall thickening, or biliary dilatation. Pancreas: Unremarkable. No pancreatic ductal dilatation or surrounding inflammatory changes. Spleen: Normal in size without focal abnormality. Adrenals/Urinary Tract: The adrenal glands appear normal. There is no hydronephrosis on the right or left. Nonobstructing 0.3 cm stone in the left kidney is noted. The kidneys otherwise appear normal. Urinary bladder is distended. Stomach/Bowel: Stomach is within normal limits. Appendix appears normal. No evidence of bowel wall thickening, distention, or  inflammatory changes. Large volume of stool in the rectosigmoid colon noted. Vascular/Lymphatic: No significant vascular findings are present. No enlarged abdominal or pelvic lymph nodes. Reproductive: Status post hysterectomy. No adnexal masses. Other: None. Musculoskeletal: No acute abnormality. Convex left lumbar scoliosis and degenerative disease at L4-5 are seen. IMPRESSION: No acute abnormality abdomen or pelvis. Distended urinary bladder noted. Large volume of stool in the rectosigmoid colon. Aortic Atherosclerosis (ICD10-I70.0) and Emphysema (ICD10-J43.9). Small nonobstructing stone left kidney. Electronically Signed   By: Inge Rise M.D.   On: 10/07/2019 15:18   DG Abd 1 View  Result Date: 10/08/2019 CLINICAL DATA:  Multiple bowel movements EXAM: ABDOMEN - 1 VIEW COMPARISON:  CT 10/07/2019 FINDINGS: Resolution of the large rectal stool ball seen on comparison CT from 1 day prior. Some persistent air-filled loops of clustered mid abdominal small bowel without frank distention are nonspecific. Only small to moderate residual colonic stool burden. Degenerative changes again noted in the spine hips and pelvis. No suspicious calcifications. Multiple phleboliths in the pelvis. Additional vascular calcium over the course of the aorta and iliac arteries. Remaining soft tissues are unremarkable. IMPRESSION: 1. Resolution of the large rectal stool ball seen on comparison CT from 1 day prior. 2. Some persistent air-filled loops of mid abdominal small bowel without frank distention are nonspecific. Could reflect ileus or early obstruction. Electronically Signed   By: Lovena Le M.D.   On: 10/08/2019 15:11   NM Myocar Multi W/Spect W/Wall Motion / EF  Result Date: 10/10/2019 Pharmacological myocardial perfusion imaging study with no significant  ischemia Normal wall motion, EF estimated at 44%.  Mildly depressed ejection fraction may be secondary to GI uptake artifact No EKG changes concerning for  ischemia at peak stress or in recovery. Low risk scan Signed, Esmond Plants, MD, Ph.D [facility]   DG Chest Portable 1 View  Result Date: 10/07/2019 CLINICAL DATA:  Weakness EXAM: PORTABLE CHEST 1 VIEW COMPARISON:  05/28/2019 FINDINGS: Cardiac shadow is within normal limits. Stable aortic calcifications are seen. Lungs are  well aerated bilaterally. Diffuse emphysematous changes are seen with crowding of markings in the bases bilaterally. Left chest port is again seen and stable. No bony abnormality is noted. IMPRESSION: Changes of COPD without acute abnormality. Aortic Atherosclerosis (ICD10-I70.0) and Emphysema (ICD10-J43.9). Electronically Signed   By: Alcide Clever M.D.   On: 10/07/2019 14:37   ECHOCARDIOGRAM COMPLETE  Result Date: 10/09/2019    ECHOCARDIOGRAM REPORT   Patient Name:   Krista Cisneros Date of Exam: 10/09/2019 Medical Rec #:  [id number]           Height:       62.0 in Accession #:    [id number]          Weight:       152.5 lb Date of Birth:  [birth year]           BSA:          1.704 m Patient Age:    79 years            BP:           134/62 mmHg Patient Gender: F                   HR:           80 bpm. Exam Location:  [facility] Procedure: 2D Echo, Cardiac Doppler and Color Doppler Indications:    CAD- native vessel 414.01  History:        Patient has prior history of Echocardiogram examinations, most                 recent 08/28/2018. CHF, COPD, Signs/Symptoms:Dyspnea; Risk                 Factors:Hypertension and Dyslipidemia.  Sonographer:    Cristela Blue RDCS (AE) Referring Phys: [id number] Antonieta Iba  Sonographer Comments: Suboptimal parasternal window. Image acquisition challenging due to COPD. IMPRESSIONS  1. Left ventricular ejection fraction, by estimation, is 50 to 55%. The left ventricle has low normal function. The left ventricle has no regional wall motion abnormalities. Left ventricular diastolic parameters are consistent with Grade I diastolic dysfunction (impaired relaxation).  2.  Right ventricular systolic function is normal. The right ventricular size is normal. There is normal pulmonary artery systolic pressure.  3. Challenging image quality FINDINGS  Left Ventricle: Left ventricular ejection fraction, by estimation, is 50 to 55%. The left ventricle has low normal function. The left ventricle has no regional wall motion abnormalities. The left ventricular internal cavity size was normal in size. There is no left ventricular hypertrophy. Left ventricular diastolic parameters are consistent with Grade I diastolic dysfunction (impaired relaxation). Right Ventricle: The right ventricular size is normal. No increase in right ventricular wall thickness. Right ventricular systolic function is normal. There is normal pulmonary artery systolic pressure. The tricuspid regurgitant velocity is 2.01 m/s, and  with an assumed right atrial pressure of 10 mmHg, the estimated right ventricular systolic pressure is 26.2 mmHg. Left Atrium: Left atrial size was normal in size. Right Atrium: Right atrial size was normal in size. Pericardium: There is no evidence of pericardial effusion. Mitral Valve: The mitral valve is normal in structure. Normal mobility of the mitral valve leaflets. No evidence of mitral valve regurgitation. No evidence of mitral valve stenosis. Tricuspid Valve: The tricuspid valve is normal in structure. Tricuspid valve regurgitation is not demonstrated. No evidence of tricuspid stenosis. Aortic Valve: The aortic valve is normal in structure. Aortic valve regurgitation is not visualized. No  aortic stenosis is present. Aortic valve mean gradient measures 2.0 mmHg. Aortic valve peak gradient measures 5.1 mmHg. Aortic valve area, by VTI measures 2.45 cm. Pulmonic Valve: The pulmonic valve was normal in structure. Pulmonic valve regurgitation is not visualized. No evidence of pulmonic stenosis. Aorta: The aortic root is normal in size and structure. Venous: The inferior vena cava is normal in  size with greater than 50% respiratory variability, suggesting right atrial pressure of 3 mmHg. IAS/Shunts: No atrial level shunt detected by color flow Doppler.  LEFT VENTRICLE PLAX 2D LVIDd:         4.28 cm  Diastology LVIDs:         2.81 cm  LV e' lateral:   5.55 cm/s LV PW:         1.15 cm  LV E/e' lateral: 12.8 LV IVS:        0.85 cm  LV e' medial:    4.90 cm/s LVOT diam:     2.00 cm  LV E/e' medial:  14.5 LV SV:         48 LV SV Index:   28 LVOT Area:     3.14 cm  RIGHT VENTRICLE RV Basal diam:  3.35 cm RV S prime:     17.00 cm/s TAPSE (M-mode): 3.9 cm LEFT ATRIUM             Index       RIGHT ATRIUM           Index LA diam:        2.90 cm 1.70 cm/m  RA Area:     16.30 cm LA Vol (A2C):   95.6 ml 56.12 ml/m RA Volume:   44.90 ml  26.36 ml/m LA Vol (A4C):   27.1 ml 15.91 ml/m LA Biplane Vol: 54.9 ml 32.23 ml/m  AORTIC VALVE AV Area (Vmax):    2.15 cm AV Area (Vmean):   2.25 cm AV Area (VTI):     2.45 cm AV Vmax:           112.50 cm/s AV Vmean:          71.550 cm/s AV VTI:            0.196 m AV Peak Grad:      5.1 mmHg AV Mean Grad:      2.0 mmHg LVOT Vmax:         77.00 cm/s LVOT Vmean:        51.200 cm/s LVOT VTI:          0.153 m LVOT/AV VTI ratio: 0.78  AORTA Ao Root diam: 3.00 cm MITRAL VALVE                TRICUSPID VALVE MV Area (PHT): 2.95 cm     TR Peak grad:   16.2 mmHg MV Decel Time: 257 msec     TR Vmax:        201.00 cm/s MV E velocity: 71.10 cm/s MV A velocity: 134.00 cm/s  SHUNTS MV E/A ratio:  0.53         Systemic VTI:  0.15 m                             Systemic Diam: 2.00 cm Ida Rogue MD Electronically signed by Ida Rogue MD Signature Date/Time: 10/09/2019/4:58:57 PM    Final     ASSESSMENT: Clinical stage IIB ER negative, PR and HER-2 positive invasive carcinoma  of the right upper outer quadrant breast.  PLAN:    1. 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  Treatment was discontinued secondary to declining performance status.  Patient underwent lumpectomy on March 21, 2017, but no biopsy clip was seen in her surgical specimen.  She had repeat surgery on May 21, 2017 to remove her retained clip, which confirmed a complete pathological response. Patient completed her adjuvant XRT on September 27, 2017.  Continue letrozole for total 5 years completing treatment in February 2024. Patient's most recent mammogram on December 18, 2018 was reported as BI-RADS 2.  Repeat in August 2021.  Return to clinic in 6 months for routine evaluation. 3. Hypokalemia: Resolved.  Continue 20 mEq oral potassium supplementation daily. 4.  Iron deficiency anemia: Resolved.  Patient's hemoglobin and iron stores are now within normal limits.  She does not require additional IV Venofer today.  Patient last received treatment on August 15, 2019.  Return to clinic in 6 months as above.   5.  Bone health: Patient had a bone mineral density on July 12, 2017 reported T score of -0.5.  This is considered normal.  Continue calcium and vitamin D supplementation.  Repeat in August 2021 along with mammogram as above. 6.  Cardiac disease: Patient now has 3 stents placed.  Continue follow-up and treatment per cardiology.   Patient expressed understanding and was in agreement with this plan. She also understands that She can call clinic at any time with any questions, concerns, or complaints.   Cancer Staging Malignant neoplasm of right female breast [facility]) Staging form: Breast, AJCC 8th Edition - Clinical stage from 10/16/2016: Stage IIB (cT2, cN1, cM0, G3, ER: Negative, PR: Positive, HER2: Positive) - Signed by Lloyd Huger, MD on 10/16/2016   Lloyd Huger, MD   11/01/2019 1:30 PM

## 2019-10-30 ENCOUNTER — Other Ambulatory Visit: Payer: Self-pay | Admitting: Cardiovascular Disease

## 2019-10-30 ENCOUNTER — Other Ambulatory Visit: Payer: Self-pay | Admitting: Emergency Medicine

## 2019-10-30 DIAGNOSIS — I2510 Atherosclerotic heart disease of native coronary artery without angina pectoris: Secondary | ICD-10-CM

## 2019-10-30 DIAGNOSIS — D509 Iron deficiency anemia, unspecified: Secondary | ICD-10-CM

## 2019-10-31 ENCOUNTER — Other Ambulatory Visit: Payer: Self-pay

## 2019-10-31 ENCOUNTER — Encounter: Payer: Self-pay | Admitting: Oncology

## 2019-10-31 ENCOUNTER — Inpatient Hospital Stay (HOSPITAL_BASED_OUTPATIENT_CLINIC_OR_DEPARTMENT_OTHER): Payer: Medicare Other | Admitting: Oncology

## 2019-10-31 ENCOUNTER — Inpatient Hospital Stay: Payer: Medicare Other | Attending: Oncology

## 2019-10-31 ENCOUNTER — Inpatient Hospital Stay: Payer: Medicare Other

## 2019-10-31 VITALS — BP 147/70 | HR 79 | Temp 98.9°F | Wt 147.4 lb

## 2019-10-31 DIAGNOSIS — Z888 Allergy status to other drugs, medicaments and biological substances status: Secondary | ICD-10-CM | POA: Diagnosis not present

## 2019-10-31 DIAGNOSIS — Z800 Family history of malignant neoplasm of digestive organs: Secondary | ICD-10-CM | POA: Diagnosis not present

## 2019-10-31 DIAGNOSIS — R140 Abdominal distension (gaseous): Secondary | ICD-10-CM | POA: Diagnosis not present

## 2019-10-31 DIAGNOSIS — J439 Emphysema, unspecified: Secondary | ICD-10-CM | POA: Diagnosis not present

## 2019-10-31 DIAGNOSIS — Z171 Estrogen receptor negative status [ER-]: Secondary | ICD-10-CM | POA: Diagnosis not present

## 2019-10-31 DIAGNOSIS — Z87891 Personal history of nicotine dependence: Secondary | ICD-10-CM | POA: Diagnosis not present

## 2019-10-31 DIAGNOSIS — C50411 Malignant neoplasm of upper-outer quadrant of right female breast: Secondary | ICD-10-CM | POA: Insufficient documentation

## 2019-10-31 DIAGNOSIS — D509 Iron deficiency anemia, unspecified: Secondary | ICD-10-CM

## 2019-10-31 DIAGNOSIS — Z79811 Long term (current) use of aromatase inhibitors: Secondary | ICD-10-CM | POA: Insufficient documentation

## 2019-10-31 DIAGNOSIS — I700 Atherosclerosis of aorta: Secondary | ICD-10-CM | POA: Insufficient documentation

## 2019-10-31 DIAGNOSIS — I5022 Chronic systolic (congestive) heart failure: Secondary | ICD-10-CM | POA: Insufficient documentation

## 2019-10-31 DIAGNOSIS — I110 Hypertensive heart disease with heart failure: Secondary | ICD-10-CM | POA: Diagnosis not present

## 2019-10-31 DIAGNOSIS — N200 Calculus of kidney: Secondary | ICD-10-CM | POA: Diagnosis not present

## 2019-10-31 DIAGNOSIS — I2510 Atherosclerotic heart disease of native coronary artery without angina pectoris: Secondary | ICD-10-CM | POA: Insufficient documentation

## 2019-10-31 DIAGNOSIS — M5136 Other intervertebral disc degeneration, lumbar region: Secondary | ICD-10-CM | POA: Insufficient documentation

## 2019-10-31 DIAGNOSIS — Z886 Allergy status to analgesic agent status: Secondary | ICD-10-CM | POA: Insufficient documentation

## 2019-10-31 DIAGNOSIS — Z79899 Other long term (current) drug therapy: Secondary | ICD-10-CM | POA: Insufficient documentation

## 2019-10-31 DIAGNOSIS — C50919 Malignant neoplasm of unspecified site of unspecified female breast: Secondary | ICD-10-CM | POA: Diagnosis not present

## 2019-10-31 DIAGNOSIS — Z880 Allergy status to penicillin: Secondary | ICD-10-CM | POA: Diagnosis not present

## 2019-10-31 LAB — BASIC METABOLIC PANEL
Anion gap: 9 (ref 5–15)
BUN: 27 mg/dL — ABNORMAL HIGH (ref 8–23)
CO2: 26 mmol/L (ref 22–32)
Calcium: 9 mg/dL (ref 8.9–10.3)
Chloride: 105 mmol/L (ref 98–111)
Creatinine, Ser: 1.31 mg/dL — ABNORMAL HIGH (ref 0.44–1.00)
GFR calc Af Amer: 45 mL/min — ABNORMAL LOW (ref >60)
GFR calc non Af Amer: 39 mL/min — ABNORMAL LOW (ref >60)
Glucose, Bld: 112 mg/dL — ABNORMAL HIGH (ref 70–99)
Potassium: 4.3 mmol/L (ref 3.5–5.1)
Sodium: 140 mmol/L (ref 135–145)

## 2019-10-31 LAB — IRON AND TIBC
Iron: 37 ug/dL (ref 28–170)
Saturation Ratios: 11 % (ref 10.4–31.8)
TIBC: 353 ug/dL (ref 250–450)
UIBC: 316 ug/dL

## 2019-10-31 LAB — CBC WITH DIFFERENTIAL/PLATELET
Abs Immature Granulocytes: 0.07 10*3/uL (ref 0.00–0.07)
Basophils Absolute: 0.1 10*3/uL (ref 0.0–0.1)
Basophils Relative: 1 %
Eosinophils Absolute: 1.3 10*3/uL — ABNORMAL HIGH (ref 0.0–0.5)
Eosinophils Relative: 13 %
HCT: 39.8 % (ref 36.0–46.0)
Hemoglobin: 12.4 g/dL (ref 12.0–15.0)
Immature Granulocytes: 1 %
Lymphocytes Relative: 17 %
Lymphs Abs: 1.7 10*3/uL (ref 0.7–4.0)
MCH: 27 pg (ref 26.0–34.0)
MCHC: 31.2 g/dL (ref 30.0–36.0)
MCV: 86.5 fL (ref 80.0–100.0)
Monocytes Absolute: 1.2 10*3/uL — ABNORMAL HIGH (ref 0.1–1.0)
Monocytes Relative: 12 %
Neutro Abs: 5.7 10*3/uL (ref 1.7–7.7)
Neutrophils Relative %: 56 %
Platelets: 336 10*3/uL (ref 150–400)
RBC: 4.6 MIL/uL (ref 3.87–5.11)
RDW: 20.1 % — ABNORMAL HIGH (ref 11.5–15.5)
WBC: 10 10*3/uL (ref 4.0–10.5)
nRBC: 0 % (ref 0.0–0.2)

## 2019-10-31 LAB — FERRITIN: Ferritin: 24 ng/mL (ref 11–307)

## 2019-10-31 MED ORDER — HEPARIN SOD (PORK) LOCK FLUSH 100 UNIT/ML IV SOLN
500.0000 [IU] | Freq: Once | INTRAVENOUS | Status: AC
  Administered 2019-10-31: 500 [IU] via INTRAVENOUS
  Filled 2019-10-31: qty 5

## 2019-10-31 MED ORDER — SODIUM CHLORIDE 0.9% FLUSH
10.0000 mL | Freq: Once | INTRAVENOUS | Status: AC
  Administered 2019-10-31: 10 mL via INTRAVENOUS
  Filled 2019-10-31: qty 10

## 2019-11-04 ENCOUNTER — Ambulatory Visit: Payer: Medicare Other | Admitting: Cardiovascular Disease

## 2019-11-13 NOTE — Progress Notes (Signed)
09/23/19 12:02 PM   Krista Cisneros 07/19/1940 [id number]  Referring provider: Sharyne Peach, MD [place],  [place] [id number] Chief Complaint  Patient presents with  . Urinary Retention   Urological History - Went to the ED (03/12/2018) for urinary retention; Hospitalized (03/14/2018); Foley placed following +9104ml PVR - CT (03/15/2018) revealed left sided nephrolithiasis w/o obstructive uropathy - UA (03/26/2018); Mixed flora, probable contamination - Foley removed the week of (03/31/2018) by home health nurse.  -Hospitalized 10/07/19 with UTI and constipation, found to be urinary retention. Had a foley catheter placed by urology PA. -CT on 10/07/19 noted small nonobstructing stone left kidney; no hydronephrosis -Constipation has resolved   HPI: Krista Cisneros is a 79 y.o. female who presents today for follow up after Foley was removed two months ago.   The patient is  experiencing urgency x 0-3, frequency x 0-3, not restricting fluids to avoid visits to the restroom, is engaging in toilet mapping, incontinence x 0-3 and nocturia x 0-3.   Her BP is 128/83.   Her PVR is 0 mL.  Patient denies any modifying or aggravating factors.  Patient denies any gross hematuria, dysuria or suprapubic/flank pain.  Patient denies any fevers, chills, nausea or vomiting.   She is currently on Macrobid for an UTI after her CBC returned abnormal.  Patient denies any modifying or aggravating factors.  Patient denies any gross hematuria, dysuria or suprapubic/flank pain.  Patient denies any fevers, chills, nausea or vomiting.   CBC 10/22/2019  WBC (White Blood Cell Count) 3.2 - 9.8 x10^9/L 9.4      Hemoglobin 12.0 - 15.5 g/dL 11.9Low      Hematocrit 35.0 - 45.0 % 41.1      Platelets 150 - 450 x10^9/L 496High      MCV (Mean Corpuscular Volume) 80 - 98 fL 91      MCH (Mean Corpuscular Hemoglobin) 26.5 - 34.0 pg 26.2Low      MCHC (Mean Corpuscular Hemoglobin  Concentration) 31.4 - 36.0 % 29.0Low      RBC (Red Blood Cell Count) 3.77 - 5.16 x10^12/L 4.54      RDW-CV (Red Cell Distribution Width) 11.5 - 14.5 % 23.0High      NRBC (Nucleated Red Blood Cell Count) 0 x10^9/L 0.00      NRBC % (Nucleated Red Blood Cell %) % 0.0      MPV (Mean Platelet Volume) 7.2 - 11.7 fL 9.3      Neutrophil Count 2.0 - 8.6 x10^9/L 5.7      Neutrophil % 37 - 80 % 60.1      Lymphocyte Count 0.6 - 4.2 x10^9/L 1.6      Lymphocyte % 10 - 50 % 16.8      Monocyte Count 0 - 0.9 x10^9/L 1.2High      Monocyte % 0 - 12 % 12.5High      Eosinophil Count 0 - 0.70 x10^9/L 0.80High      Eosinophil % 0 - 7 % 8.5High      Basophil Count 0 - 0.20 x10^9/L 0.12      Basophil % 0 - 2 % 1.3      Immature Granulocyte Count <=0.06 x10^9/L 0.08High      Immature Granulocyte % <=0.7 % 0.8High      Resulting Agency  [facility]  Specimen Collected: 10/22/19 2:51 PM Last Resulted: 10/22/19 8:12 PM  Received From: [place]  Result Received: 10/26/19 9:40 AM  PMH: Past Medical History:  Diagnosis Date  . Anxiety   . Arthritis   . CAD (coronary artery disease)    a. NSTEMI 12/19; b. LHC 04/10/18: pLAD 95%, mLAD 80%, m-dLCx 95%, OM3-1 lesion 40%, OM3-2 lesion 60%, mRCA 50%, EF 35-45%, successful PCI/DES x 2 to the LAD with recommended staged PCI of the LCx in a few weeks  . Cancer [facility])    Right Breast Cancer  . Chronic systolic CHF (congestive heart failure) ([place])    a. TTE 12/19: EF 30-35%, anteroseptal, anterior, and apical HK, Gr1DD, mild to mod MR, mildly dilated LA, RVSF nl  . COPD (chronic obstructive pulmonary disease) ([place])   . Depression   . Dyspnea    with exertion  . GERD (gastroesophageal reflux disease)   . Hyperlipidemia   . Hypertension   . Personal history of chemotherapy 2018   chemo prior to lumpectomy of right breast  . Personal history of radiation therapy 2019   right breast  ca    Surgical History: Past Surgical History:  Procedure Laterality Date  . ABDOMINAL HYSTERECTOMY  1990   Partial  . BREAST BIOPSY Right 10/04/2016   axilla lymph node (metastatic carcinoma) and axillay tail mass biopsy-invasive mammary carcinoma  . BREAST LUMPECTOMY Right 03/21/2017   chemo first, [facility] and metastatic LN  . BREAST LUMPECTOMY Right 05/21/2017   re-excision for clip  . BREAST LUMPECTOMY WITH NEEDLE LOCALIZATION Right 03/21/2017   Procedure: BREAST LUMPECTOMY WITH NEEDLE LOCALIZATION;  Surgeon: Clayburn Pert, MD;  Location: [facility] ORS;  Service: General;  Laterality: Right;  . CORONARY STENT INTERVENTION N/A 04/10/2018   Procedure: CORONARY STENT INTERVENTION;  Surgeon: Wellington Hampshire, MD;  Location: [place] [facility];  Service: Cardiovascular;  Laterality: N/A;  . CORONARY STENT INTERVENTION N/A 05/14/2018   Procedure: CORONARY STENT INTERVENTION;  Surgeon: Nelva Bush, MD;  Location: [place] [facility];  Service: Cardiovascular;  Laterality: N/A;  . DILATION AND CURETTAGE OF UTERUS    . INGUINAL HERNIA REPAIR Right 03/26/2017   Procedure: HERNIA REPAIR INGUINAL INCARCERATED;  Surgeon: Jules Husbands, MD;  Location: [facility] ORS;  Service: General;  Laterality: Right;  . LEFT HEART CATH AND CORONARY ANGIOGRAPHY N/A 04/10/2018   Procedure: LEFT HEART CATH AND CORONARY ANGIOGRAPHY poss PCI;  Surgeon: Minna Merritts, MD;  Location: [place] [facility];  Service: Cardiovascular;  Laterality: N/A;  . PORTACATH PLACEMENT Left 10/24/2016   Procedure: INSERTION PORT-A-CATH;  Surgeon: [name], MD;  Location: [facility] ORS;  Service: General;  Laterality: Left;  . RE-EXCISION OF BREAST LUMPECTOMY Right 05/21/2017   Procedure: RE-EXCISION OF BREAST LUMPECTOMY;  Surgeon: Clayburn Pert, MD;  Location: [facility] ORS;  Service: General;  Laterality: Right;  . SENTINEL NODE BIOPSY Right 03/21/2017   Procedure: SENTINEL NODE BIOPSY;  Surgeon: Clayburn Pert, MD;  Location: [facility]  ORS;  Service: General;  Laterality: Right;    Home Medications:  Allergies as of 11/14/2019      Reactions   Nsaids    Bleeding risk   Penicillins Anaphylaxis, Swelling, Rash   Has patient had a PCN reaction causing immediate rash, facial/tongue/throat swelling, SOB or lightheadedness with hypotension: Yes Has patient had a PCN reaction causing severe rash involving mucus membranes or skin necrosis: No Has patient had a PCN reaction that required hospitalization: No  Has patient had a PCN reaction occurring within the last 10 years: No If all of the above answers are "NO", then may proceed with Cephalosporin use.   Tamsulosin  Nausea Only   Atorvastatin Other (See Comments)   Muscle Pain   Gabapentin Swelling   Ezetimibe Other (See Comments)   Muscle pain   Aspirin    GI bleeding risk      Medication List       Accurate as of November 14, 2019 12:02 PM. If you have any questions, ask your nurse or doctor.        STOP taking these medications   tamsulosin 0.4 MG Caps capsule Commonly known as: FLOMAX Stopped by: Zara Council, PA-C     TAKE these medications   Advair Diskus 500-50 MCG/DOSE Aepb Generic drug: Fluticasone-Salmeterol Inhale 1 puff into the lungs 2 (two) times daily.   albuterol 108 (90 Base) MCG/ACT inhaler Commonly known as: VENTOLIN HFA Inhale 2 puffs into the lungs every 6 (six) hours as needed for wheezing or shortness of breath.   aspirin EC 81 MG tablet Take 81 mg by mouth daily.   baclofen 10 MG tablet Commonly known as: LIORESAL Take 10 mg by mouth at bedtime.   Calcium-Vitamin D-Vitamin K 035-465-68 MG-UNT-MCG Tabs Take 1 tablet by mouth daily.   carvedilol 3.125 MG tablet Commonly known as: COREG Take 2 tablets (6.25 mg total) by mouth 2 (two) times daily.   cholecalciferol 25 MCG (1000 UNIT) tablet Commonly known as: VITAMIN D3 Take 1,000 Units by mouth daily.   clopidogrel 75 MG tablet Commonly known as: PLAVIX Take 1 tablet (75  mg total) by mouth daily with breakfast.   Coenzyme Q10 300 MG Caps Take 300 mg by mouth daily.   DULoxetine 30 MG capsule Commonly known as: CYMBALTA Take 30 mg by mouth every morning.   fluticasone 50 MCG/ACT nasal spray Commonly known as: FLONASE Place 2 sprays into the nose daily.   furosemide 20 MG tablet Commonly known as: Lasix Take 1 tablet (20 mg total) by mouth as needed for edema.   isosorbide mononitrate 30 MG 24 hr tablet Commonly known as: IMDUR Take by mouth.   letrozole 2.5 MG tablet Commonly known as: FEMARA TAKE 1 TABLET BY MOUTH EVERY DAY   lidocaine-prilocaine cream Commonly known as: EMLA Apply 1 application as needed topically (for port access).   magnesium oxide 400 MG tablet Commonly known as: MAG-OX Take 400 mg by mouth daily.   melatonin 5 MG Tabs Take 10 mg by mouth at bedtime as needed (sleep).   Melatonin-Pyridoxine 5-1 MG Tabs Take 1 tablet by mouth daily.   montelukast 10 MG tablet Commonly known as: SINGULAIR Take 10 mg by mouth at bedtime.   nitrofurantoin (macrocrystal-monohydrate) 100 MG capsule Commonly known as: MACROBID Take 100 mg by mouth 2 (two) times daily.   nitroGLYCERIN 0.4 MG SL tablet Commonly known as: NITROSTAT Place 1 tablet (0.4 mg total) under the tongue every 5 (five) minutes as needed for chest pain.   pantoprazole 40 MG tablet Commonly known as: PROTONIX Take 1 tablet (40 mg total) by mouth daily.   rosuvastatin 20 MG tablet Commonly known as: CRESTOR TAKE 1 TABLET BY MOUTH EVERY DAY AT 6PM       Allergies:  Allergies  Allergen Reactions  . Nsaids     Bleeding risk  . Penicillins Anaphylaxis, Swelling and Rash    Has patient had a PCN reaction causing immediate rash, facial/tongue/throat swelling, SOB or lightheadedness with hypotension: Yes Has patient had a PCN reaction causing severe rash involving mucus membranes or skin necrosis: No Has patient had a PCN reaction that required  hospitalization: No  Has patient had a PCN reaction occurring within the last 10 years: No If all of the above answers are "NO", then may proceed with Cephalosporin use.   . Tamsulosin Nausea Only  . Atorvastatin Other (See Comments)    Muscle Pain  . Gabapentin Swelling  . Ezetimibe Other (See Comments)    Muscle pain  . Aspirin     GI bleeding risk    Family History: Family History  Problem Relation Age of Onset  . Colon cancer Mother   . Breast cancer Neg Hx     Social History:  reports that she quit smoking about 31 years ago. Her smoking use included cigarettes. She smoked 0.50 packs per day. She has never used smokeless tobacco. She reports current alcohol use. She reports that she does not use drugs.   Physical Exam: BP 128/83   Pulse 91   Wt 147 lb (66.7 kg)   BMI 26.89 kg/m   Constitutional:  Well nourished. Alert and oriented, No acute distress. HEENT: [place] AT, mask in place.  Trachea midline Cardiovascular: No clubbing, cyanosis, or edema. Respiratory: Normal respiratory effort, no increased work of breathing. Neurologic: Grossly intact, no focal deficits, moving all 4 extremities. Psychiatric: Normal mood and affect.   Assessment & Plan:    1. Urinary Retention  Resolved at this time RTC in three months to monitor PVR closely to prevent another episode of retention  [facility] [address], [place], [place] [address] [phone]

## 2019-11-14 ENCOUNTER — Encounter: Payer: Self-pay | Admitting: Urology

## 2019-11-14 ENCOUNTER — Other Ambulatory Visit: Payer: Self-pay

## 2019-11-14 ENCOUNTER — Ambulatory Visit: Payer: Medicare Other | Admitting: Urology

## 2019-11-14 VITALS — BP 128/83 | HR 91 | Wt 147.0 lb

## 2019-11-14 DIAGNOSIS — R339 Retention of urine, unspecified: Secondary | ICD-10-CM | POA: Diagnosis not present

## 2019-11-14 LAB — BLADDER SCAN AMB NON-IMAGING: Scan Result: 0

## 2019-11-16 ENCOUNTER — Other Ambulatory Visit: Payer: Self-pay | Admitting: Oncology

## 2019-11-16 DIAGNOSIS — E876 Hypokalemia: Secondary | ICD-10-CM

## 2019-11-17 ENCOUNTER — Ambulatory Visit: Payer: Medicare Other | Admitting: Family

## 2019-12-01 ENCOUNTER — Ambulatory Visit: Payer: Medicare Other | Admitting: Family

## 2019-12-01 ENCOUNTER — Other Ambulatory Visit: Payer: Self-pay

## 2019-12-01 ENCOUNTER — Encounter: Payer: Self-pay | Admitting: Family

## 2019-12-01 VITALS — BP 130/70 | HR 83 | Ht 62.0 in | Wt 148.1 lb

## 2019-12-01 DIAGNOSIS — I5022 Chronic systolic (congestive) heart failure: Secondary | ICD-10-CM

## 2019-12-01 DIAGNOSIS — I25118 Atherosclerotic heart disease of native coronary artery with other forms of angina pectoris: Secondary | ICD-10-CM

## 2019-12-01 DIAGNOSIS — I10 Essential (primary) hypertension: Secondary | ICD-10-CM

## 2019-12-01 DIAGNOSIS — E785 Hyperlipidemia, unspecified: Secondary | ICD-10-CM | POA: Diagnosis not present

## 2019-12-01 DIAGNOSIS — N179 Acute kidney failure, unspecified: Secondary | ICD-10-CM

## 2019-12-01 NOTE — Progress Notes (Signed)
Office Visit    Patient Name: Krista Cisneros Date of Encounter: 12/01/2019  Primary Care Provider:  Sharyne Peach, MD2 Primary Cardiologist:  Ida Rogue, MD Electrophysiologist:  None   Chief Complaint    Krista Cisneros is a 79 y.o. female with a hx of CAD s/p NSTEMI 03/2018 s/p PCI/DESx2 to LAD, ICM/HFrEF, HTN, HLD, breast cancer s/p chemoradiation, GERD presents today for follow up of HFrEF.  Past Medical History    Past Medical History:  Diagnosis Date   Anxiety    Arthritis    CAD (coronary artery disease)    a. NSTEMI 12/19; b. LHC 04/10/18: pLAD 95%, mLAD 80%, m-dLCx 95%, OM3-1 lesion 40%, OM3-2 lesion 60%, mRCA 50%, EF 35-45%, successful PCI/DES x 2 to the LAD with recommended staged PCI of the LCx in a few weeks   Cancer ([facility])    Right Breast Cancer   Chronic systolic CHF (congestive heart failure) ([place])    a. TTE 12/19: EF 30-35%, anteroseptal, anterior, and apical HK, Gr1DD, mild to mod MR, mildly dilated LA, RVSF nl   COPD (chronic obstructive pulmonary disease) ([facility])    Depression    Dyspnea    with exertion   GERD (gastroesophageal reflux disease)    Hyperlipidemia    Hypertension    Personal history of chemotherapy 2018   chemo prior to lumpectomy of right breast   Personal history of radiation therapy 2019   right breast ca   Past Surgical History:  Procedure Laterality Date   ABDOMINAL HYSTERECTOMY  1990   Partial   BREAST BIOPSY Right 10/04/2016   axilla lymph node (metastatic carcinoma) and axillay tail mass biopsy-invasive mammary carcinoma   BREAST LUMPECTOMY Right 03/21/2017   chemo first, [facility] and metastatic LN   BREAST LUMPECTOMY Right 05/21/2017   re-excision for clip   BREAST LUMPECTOMY WITH NEEDLE LOCALIZATION Right 03/21/2017   Procedure: BREAST LUMPECTOMY WITH NEEDLE LOCALIZATION;  Surgeon: Clayburn Pert, MD;  Location: [facility] ORS;  Service: General;  Laterality: Right;   CORONARY STENT INTERVENTION N/A  04/10/2018   Procedure: CORONARY STENT INTERVENTION;  Surgeon: Wellington Hampshire, MD;  Location: [place] [facility];  Service: Cardiovascular;  Laterality: N/A;   CORONARY STENT INTERVENTION N/A 05/14/2018   Procedure: CORONARY STENT INTERVENTION;  Surgeon: Nelva Bush, MD;  Location: [place] [facility];  Service: Cardiovascular;  Laterality: N/A;   DILATION AND CURETTAGE OF UTERUS     INGUINAL HERNIA REPAIR Right 03/26/2017   Procedure: HERNIA REPAIR INGUINAL INCARCERATED;  Surgeon: Jules Husbands, MD;  Location: [facility] ORS;  Service: General;  Laterality: Right;   LEFT HEART CATH AND CORONARY ANGIOGRAPHY N/A 04/10/2018   Procedure: LEFT HEART CATH AND CORONARY ANGIOGRAPHY poss PCI;  Surgeon: Minna Merritts, MD;  Location: [place] [facility];  Service: Cardiovascular;  Laterality: N/A;   PORTACATH PLACEMENT Left 10/24/2016   Procedure: INSERTION PORT-A-CATH;  Surgeon: [name], MD;  Location: [facility] ORS;  Service: General;  Laterality: Left;   RE-EXCISION OF BREAST LUMPECTOMY Right 05/21/2017   Procedure: RE-EXCISION OF BREAST LUMPECTOMY;  Surgeon: Clayburn Pert, MD;  Location: [facility] ORS;  Service: General;  Laterality: Right;   SENTINEL NODE BIOPSY Right 03/21/2017   Procedure: SENTINEL NODE BIOPSY;  Surgeon: Clayburn Pert, MD;  Location: [facility] ORS;  Service: General;  Laterality: Right;    Allergies  Allergies  Allergen Reactions   Nsaids     Bleeding risk   Penicillins Anaphylaxis, Swelling and Rash    Has patient  had a PCN reaction causing immediate rash, facial/tongue/throat swelling, SOB or lightheadedness with hypotension: Yes Has patient had a PCN reaction causing severe rash involving mucus membranes or skin necrosis: No Has patient had a PCN reaction that required hospitalization: No  Has patient had a PCN reaction occurring within the last 10 years: No If all of the above answers are "NO", then may proceed with Cephalosporin use.    Tamsulosin Nausea  Only   Atorvastatin Other (See Comments)    Muscle Pain   Gabapentin Swelling   Ezetimibe Other (See Comments)    Muscle pain   Aspirin     GI bleeding risk    History of Present Illness    Krista Cisneros is a 79 y.o. female with a hx of CAD s/p NSTEMI 03/2018 s/p PCI/DESx2 to LAD and staged DES to LCx, ICM/HFrEF, HTN, HLD, breast cancer s/p chemoradiation, GERD, tobacco use. She was last seen 10/21/19.  Admitted 03/2018 with NSTEMI. Echo 04/10/18 with LVEF 30-35%, hypokinesis of anteroseptal, anterior, apical myocardium, gr1DD, LA mildly dilated, mild to moderate MR. Cardiac catheterization 04/10/18 with prox LAD 95% and mid LAD 80% with mid to distal Cx 95%, prox OM3 large vessel 95%, mid RCA 50%. Underwent DESx2 to LAD. Underwent staged intervention with DES to LCx 05/14/18.   Echo 08/28/18 with improvement in LVEF from 35 % to 50-55%, septal wall hypokinesis, gr1DD, RV normal size and function, no significant valvular abnormalities.  Seen in clinic 09/03/19 noting chest pain on and off for one month. She was started on Imdur.  Presented to [facility] ED 10/07/19 with constipation and vague abdominal pain. Elevated troponin likely due to supply-demand mismatch. Diagnosed with sepsis, AKI, constipation. Echo 10/09/19 LVEF 50-55%, nor RWMA, gr1DD, no significant valvular abnormalities. Lexiscan myoview 10/10/19 low risk scan with no significant ischemia. She was recommended to stop Imdur, potassium, and spironolactone due to AKI at time of hospital discharge.  Seen in clinic 10/21/19 with no recurrent chest pain and stable dyspnea on exertion. Subsequent labs with mild decline in kidney function and mildly elevated potassium, her spironolactone was held.   Most recent labs 10/31/19 with K 4.3, creatinine 1.31, GFR 39.   She presents with her grandson today.  She reports feeling overall well.  Reports no shortness of breath at rest.  Does endorse stable dyspnea on exertion.  No edema, orthopnea, PND.   We discussed her spironolactone and she tells me she misunderstood directions and has been taking her spironolactone at a dose of 25 mg daily.  She recently went to visit her brother in [place] who is not doing well and has been diagnosed with cancer and I offered my condolences.  EKGs/Labs/Other Studies Reviewed:   The following studies were reviewed today:  EKG:  EKG is ordered today.  The ekg ordered today demonstrates NSR 83 bpm with resolution of TWI in anterolateral leads with exceptio of V2.  Recent Labs: 10/07/2019: ALT 16; Magnesium 3.1 10/31/2019: BUN 27; Creatinine, Ser 1.31; Hemoglobin 12.4; Platelets 336; Potassium 4.3; Sodium 140  Recent Lipid Panel    Component Value Date/Time   CHOL 189 04/10/2018 0821   TRIG 77 04/10/2018 0821   HDL 40 (L) 04/10/2018 0821   CHOLHDL 4.7 04/10/2018 0821   VLDL 15 04/10/2018 0821   LDLCALC 134 (H) 04/10/2018 0821    Home Medications   Current Meds  Medication Sig   ADVAIR DISKUS 500-50 MCG/DOSE AEPB Inhale 1 puff into the lungs 2 (two) times daily.  albuterol (PROVENTIL HFA;VENTOLIN HFA) 108 (90 Base) MCG/ACT inhaler Inhale 2 puffs into the lungs every 6 (six) hours as needed for wheezing or shortness of breath.   aspirin EC 81 MG tablet Take 81 mg by mouth daily.   baclofen (LIORESAL) 10 MG tablet Take 10 mg by mouth at bedtime.   Calcium-Vitamin D-Vitamin K 696-295-28 MG-UNT-MCG TABS Take 1 tablet by mouth daily.   carvedilol (COREG) 3.125 MG tablet Take 2 tablets (6.25 mg total) by mouth 2 (two) times daily.   cholecalciferol (VITAMIN D3) 25 MCG (1000 UT) tablet Take 1,000 Units by mouth daily.   clopidogrel (PLAVIX) 75 MG tablet Take 1 tablet (75 mg total) by mouth daily with breakfast.   Coenzyme Q10 300 MG CAPS Take 300 mg by mouth daily.    DULoxetine (CYMBALTA) 30 MG capsule Take 30 mg by mouth every morning.    fluticasone (FLONASE) 50 MCG/ACT nasal spray Place 2 sprays into the nose daily.   furosemide  (LASIX) 20 MG tablet Take 1 tablet (20 mg total) by mouth as needed for edema.   KLOR-CON M20 20 MEQ tablet TAKE 1 TABLET BY MOUTH EVERY DAY   letrozole (FEMARA) 2.5 MG tablet Take 2.5 mg by mouth daily.   lidocaine-prilocaine (EMLA) cream Apply 1 application as needed topically (for port access).   magnesium oxide (MAG-OX) 400 MG tablet Take 400 mg by mouth daily.   Melatonin 5 MG TABS Take 10 mg by mouth at bedtime as needed (sleep).   Melatonin-Pyridoxine 5-1 MG TABS Take 1 tablet by mouth daily.   montelukast (SINGULAIR) 10 MG tablet Take 10 mg by mouth at bedtime.    nitrofurantoin, macrocrystal-monohydrate, (MACROBID) 100 MG capsule Take 100 mg by mouth 2 (two) times daily.   nitroGLYCERIN (NITROSTAT) 0.4 MG SL tablet Place 1 tablet (0.4 mg total) under the tongue every 5 (five) minutes as needed for chest pain.   pantoprazole (PROTONIX) 40 MG tablet Take 1 tablet (40 mg total) by mouth daily.   rosuvastatin (CRESTOR) 20 MG tablet TAKE 1 TABLET BY MOUTH EVERY DAY AT 6PM   spironolactone (ALDACTONE) 25 MG tablet Take 25 mg by mouth daily.    Review of Systems      Review of Systems  Constitutional: Negative for chills, fever and malaise/fatigue.  Cardiovascular: Positive for dyspnea on exertion. Negative for chest pain, leg swelling, near-syncope, orthopnea, palpitations and syncope.  Respiratory: Negative for cough, shortness of breath and wheezing.   Gastrointestinal: Negative for nausea and vomiting.  Neurological: Negative for dizziness, light-headedness and weakness.   All other systems reviewed and are otherwise negative except as noted above.  Physical Exam   VS:  BP 130/70 (BP Location: Left Arm, Patient Position: Sitting, Cuff Size: Normal)    Pulse 83    Ht 5\' 2"  (1.575 m)    Wt 148 lb 2 oz (67.2 kg)    SpO2 92%    BMI 27.09 kg/m  , BMI Body mass index is 27.09 kg/m. GEN: Well nourished, well developed, in no acute distress. HEENT: normal. Neck: Supple, no  JVD, carotid bruits, or masses. Cardiac: RRR, no murmurs, rubs, or gallops. No clubbing, cyanosis, edema.  Radials/PT 2+ and equal bilaterally.  Respiratory:  Respirations regular and unlabored, clear to auscultation bilaterally. GI: Soft, nontender, nondistended, BS + x 4. MS: No deformity or atrophy. Skin: Warm and dry, no rash. Neuro:  Strength and sensation are intact. Psych: Normal affect.  Assessment & Plan   1. CAD - Recent  admission 09/2019 with low risk Lexiscan Myoview.  Present GDMT includes aspirin, beta blocker, plavix, statin, PRN nitroglycerin. Reports no bleeding with DAPT. Low sodium diet recommended. Regular cardiovascular exercise encouraged.    2. ICM/HFrEF - Echo 09/2019 LVEF 50-55%, no RWMA, gr1DD, no significant valvular abnormalities. Euvolemic and well compensated today. NYHA II with DOE. GDMT includes Coreg, Enalapril 5mg , Lasix 20mg .  She was previously asked to stop spironolactone but tells me she has still been taking 25 mg daily she misunderstood directions.  We will repeat BMP today.  If renal function and potassium normal, will continue if not, consider reduced dose versus discontinuation.  3. HLD, LDL goal <70 - 11/2018 LDL 51. Continue rosuvastatin 20mg  daily.   4. GERD - Reports well controlled on Pantoprazole 40mg  daily.  5. AKI - Noted during recent admission. Careful titration of diuretics and antihypertensives.  BMP today for monitoring.  Disposition: F/u in in 4-5 months with Dr. Rockey Situ or APP.   Loel Dubonnet, NP 12/01/2019, 4:44 PM

## 2019-12-01 NOTE — Patient Instructions (Addendum)
Medication Instructions:  No medication changes today.   Please double check your medication at home to make sure that you are taking Spironolactone.   *If you need a refill on your cardiac medications before your next appointment, please call your pharmacy*   Lab Work: Your physician recommends that you return for lab work today: BMP  If you have labs (blood work) drawn today and your tests are completely normal, you will receive your results only by: Marland Kitchen MyChart Message (if you have MyChart) OR . A paper copy in the mail If you have any lab test that is abnormal or we need to change your treatment, we will call you to review the results.   Testing/Procedures: Your EKG today was stable compared to previous and showed normal sinus rhythm.   Follow-Up: At [facility], you and your health needs are our priority.  As part of our continuing mission to provide you with exceptional heart care, we have created designated Provider Care Teams.  These Care Teams include your primary Cardiologist (physician) and Advanced Practice Providers (APPs -  Physician Assistants and Nurse Practitioners) who all work together to provide you with the care you need, when you need it.  We recommend signing up for the patient portal called "MyChart".  Sign up information is provided on this After Visit Summary.  MyChart is used to connect with patients for Virtual Visits (Telemedicine).  Patients are able to view lab/test results, encounter notes, upcoming appointments, etc.  Non-urgent messages can be sent to your provider as well.   To learn more about what you can do with MyChart, go to [email or web address].    Your next appointment:   4-5 month(s)  The format for your next appointment:   In Person  Provider:   You may see Ida Rogue, MD or one of the following Advanced Practice Providers on your designated Care Team:    Murray Hodgkins, NP  Christell Faith, PA-C  Marrianne Mood,  PA-C  Laurann Montana, NP  Other Instructions   Continue low salt, heart healthy diet.   Continue to elevate your lower extremities when sitting.   Recommend a regular exercise regimen such as walking a few times per week.

## 2019-12-02 LAB — BASIC METABOLIC PANEL
BUN/Creatinine Ratio: 20 (ref 12–28)
BUN: 29 mg/dL — ABNORMAL HIGH (ref 8–27)
CO2: 24 mmol/L (ref 20–29)
Calcium: 9.7 mg/dL (ref 8.7–10.3)
Chloride: 105 mmol/L (ref 96–106)
Creatinine, Ser: 1.47 mg/dL — ABNORMAL HIGH (ref 0.57–1.00)
GFR calc Af Amer: 39 mL/min/{1.73_m2} — ABNORMAL LOW (ref >59)
GFR calc non Af Amer: 34 mL/min/{1.73_m2} — ABNORMAL LOW (ref >59)
Glucose: 109 mg/dL — ABNORMAL HIGH (ref 65–99)
Potassium: 4.8 mmol/L (ref 3.5–5.2)
Sodium: 140 mmol/L (ref 134–144)

## 2019-12-03 ENCOUNTER — Other Ambulatory Visit: Payer: Self-pay

## 2019-12-03 NOTE — Telephone Encounter (Signed)
Loel Dubonnet, NP  P Cv Div [place] Triage Potassium normal. Kidney function shows mild decline.   In clinic yesterday she said she was taking aspirin Olaf tone 25 mg daily, recommend she reduce to 12.5 mg daily (half tablet).   Call attempted. No answer, no vm.

## 2019-12-03 NOTE — Telephone Encounter (Signed)
-----   Message from Loel Dubonnet, NP sent at 12/03/2019  7:27 AM EDT ----- To clarify medication changes - in clinic she said she was taking Spironolactone 25mg  daily - recommend she reduce to 12.5mg  daily (half tablet).

## 2019-12-09 NOTE — Telephone Encounter (Signed)
Call attempted. No answer, vm box full.

## 2019-12-12 ENCOUNTER — Other Ambulatory Visit: Payer: Self-pay | Admitting: Cardiovascular Disease

## 2019-12-12 DIAGNOSIS — I2510 Atherosclerotic heart disease of native coronary artery without angina pectoris: Secondary | ICD-10-CM

## 2019-12-12 NOTE — Telephone Encounter (Signed)
Call to patient to review labs.  °  °Pt verbalized understanding and has no further questions at this time.  °  °Advised pt to call for any further questions or concerns.  °Orders updated as advised.  °

## 2019-12-12 NOTE — Telephone Encounter (Signed)
Please contact patient with instructions on how the patient should be taking her Spironolactone. The patient's recent office note stated need to have lab work and there is lab work available with new instructions from Krista Montana, NP.

## 2019-12-12 NOTE — Telephone Encounter (Signed)
Patient calling for refill but notified we have been trying to reach her.

## 2019-12-15 ENCOUNTER — Telehealth: Payer: Self-pay | Admitting: Cardiovascular Disease

## 2019-12-15 MED ORDER — SPIRONOLACTONE 25 MG PO TABS: 12.5000 mg | ORAL_TABLET | Freq: Every day | ORAL | 1 refills | Status: DC

## 2019-12-15 NOTE — Telephone Encounter (Signed)
*  STAT* If patient is at the pharmacy, call can be transferred to refill team.   1. Which medications need to be refilled? (please list name of each medication and dose if known) sprinalactone  12.5 mg daily  2. Which pharmacy/location (including street and city if local pharmacy) is medication to be sent to? [facility] [place]  3. Do they need a 30 day or 90 day supply? [place]

## 2019-12-15 NOTE — Telephone Encounter (Signed)
Requested Prescriptions   Signed Prescriptions Disp Refills   spironolactone (ALDACTONE) 25 MG tablet 90 tablet 1    Sig: Take 0.5 tablets (12.5 mg total) by mouth daily.    Authorizing Provider: Minna Merritts    Ordering User: Raelene Bott, Krishang Reading L

## 2019-12-22 ENCOUNTER — Other Ambulatory Visit: Payer: Medicare Other

## 2020-01-01 ENCOUNTER — Ambulatory Visit
Admission: RE | Admit: 2020-01-01 | Discharge: 2020-01-01 | Disposition: A | Discharge status: Home / Self Care | Payer: Medicare Other | Source: Ambulatory Visit | Attending: Oncology | Admitting: Oncology

## 2020-01-01 DIAGNOSIS — C50411 Malignant neoplasm of upper-outer quadrant of right female breast: Secondary | ICD-10-CM

## 2020-01-01 DIAGNOSIS — Z13820 Encounter for screening for osteoporosis: Secondary | ICD-10-CM | POA: Insufficient documentation

## 2020-01-01 DIAGNOSIS — Z853 Personal history of malignant neoplasm of breast: Secondary | ICD-10-CM | POA: Diagnosis not present

## 2020-01-01 DIAGNOSIS — R921 Mammographic calcification found on diagnostic imaging of breast: Secondary | ICD-10-CM | POA: Diagnosis not present

## 2020-01-01 DIAGNOSIS — Z9221 Personal history of antineoplastic chemotherapy: Secondary | ICD-10-CM | POA: Diagnosis not present

## 2020-01-01 DIAGNOSIS — Z780 Asymptomatic menopausal state: Secondary | ICD-10-CM | POA: Insufficient documentation

## 2020-01-01 DIAGNOSIS — Z171 Estrogen receptor negative status [ER-]: Secondary | ICD-10-CM

## 2020-01-01 IMAGING — US US BREAST*R* LIMITED INC AXILLA
1 series · 3 of 3 positions shown · non-contrast
Comparison: Previous exam(s).

CLINICAL DATA: RIGHT lumpectomy in [RX]. History of the metastatic
lymph node. Neoadjuvant chemotherapy.

EXAM:
DIGITAL DIAGNOSTIC BILATERAL MAMMOGRAM WITH TOMO AND CAD; ULTRASOUND
RIGHT BREAST LIMITED

[Series 1: us breast*right* limited inc axilla · 0.07mm/px · 3 of 3 slices shown]
[im 1/3]
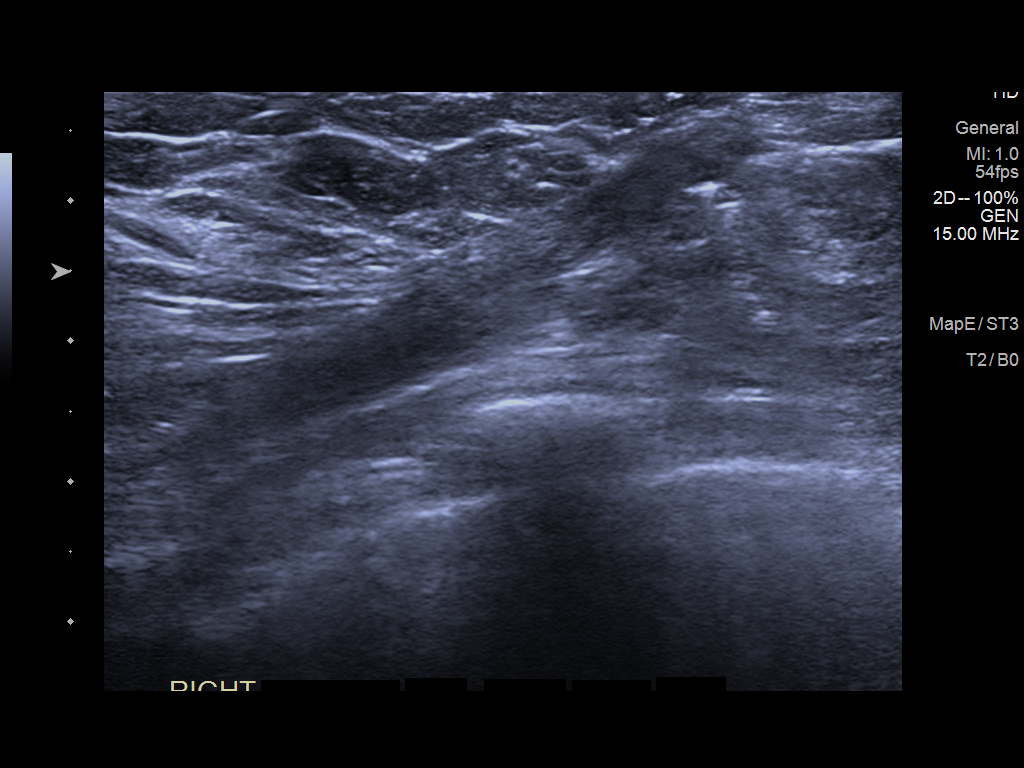
[im 2/3]
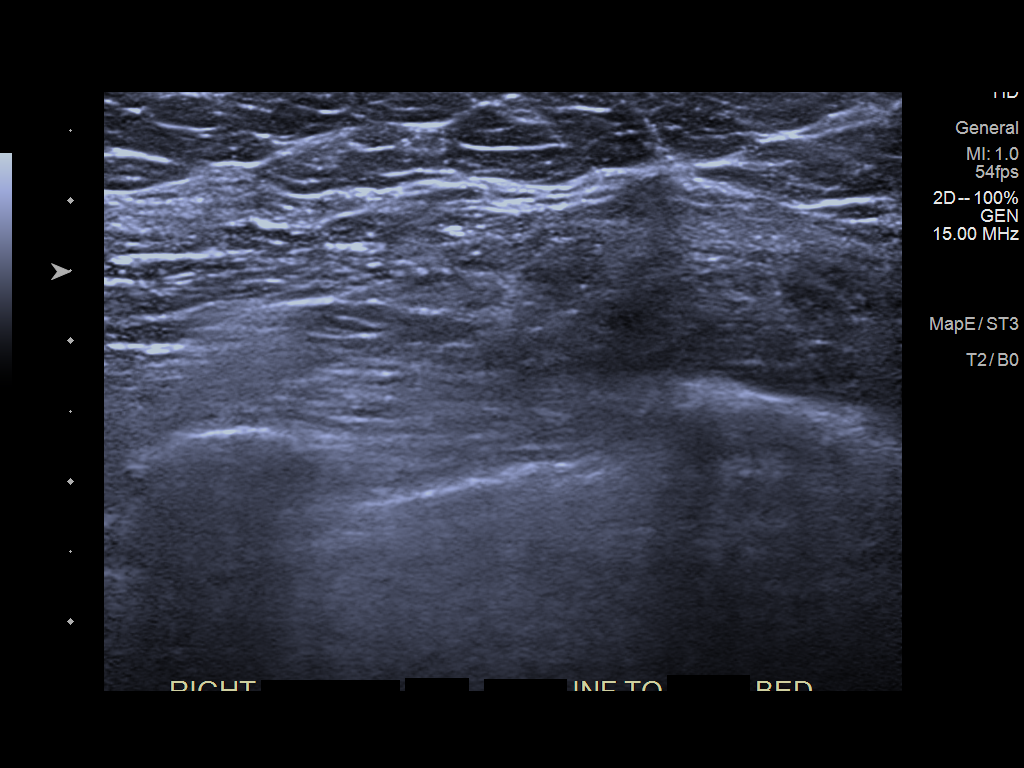
[im 3/3]
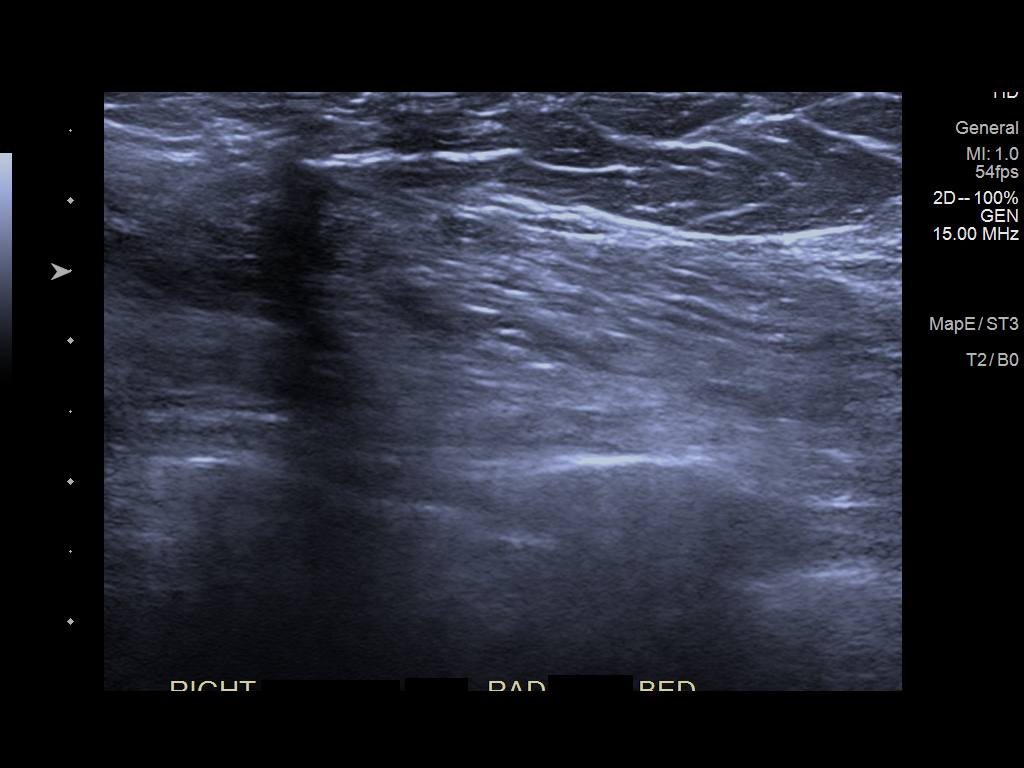

[3 of 3 positions shown; findings below may reference images not displayed]

ACR Breast Density Category c: The breast tissue is heterogeneously
dense, which may obscure small masses.
FINDINGS: RIGHT:

There is density and architectural distortion within the RIGHT
breast, consistent with postsurgical changes. There is a new group
of calcifications anterior and inferior the lumpectomy bed which
spans 3 mm. These are coarse in appearance. They are predominantly
only seen on MLO tomosynthesis and spot magnification views.
Attempts to visualize on exaggerated lateral images were minimally
successful. Multiple technicians held patient in place for LM
imaging.

Given location, targeted ultrasound was performed. No suspicious
cystic or solid mass is seen adjacent to the lumpectomy bed on
targeted ultrasound.

LEFT:

No mammographic evidence of malignancy.

Mammographic images were processed with CAD.
IMPRESSION: 1. New 3 mm group of calcifications anterior and inferior to the
lumpectomy bed, best appreciated on lateral and MLO imaging. These
likely reflect early dystrophic calcifications. Recommend follow-up
mammogram in 6 months to assess for evolution. If expected evolution
has not occurred on subsequent mammogram, stereotactic biopsy could
be considered but given deep location and challenging positioning,
the area may not be amenable to biopsy.

RECOMMENDATION:
Diagnostic RIGHT mammogram in 6 months.

I have discussed the findings and recommendations with the patient.
If applicable, a reminder letter will be sent to the patient
regarding the next appointment.

BI-RADS CATEGORY  3: Probably benign.

## 2020-01-01 IMAGING — MG DIGITAL DIAGNOSTIC BILAT W/ TOMO W/ CAD
8 of 15 series · 8 of 39 positions shown · non-contrast
Comparison: Previous exam(s).

CLINICAL DATA: RIGHT lumpectomy in [RX]. History of the metastatic
lymph node. Neoadjuvant chemotherapy.

EXAM:
DIGITAL DIAGNOSTIC BILATERAL MAMMOGRAM WITH TOMO AND CAD; ULTRASOUND
RIGHT BREAST LIMITED

[R XCCL (1 of 2)]
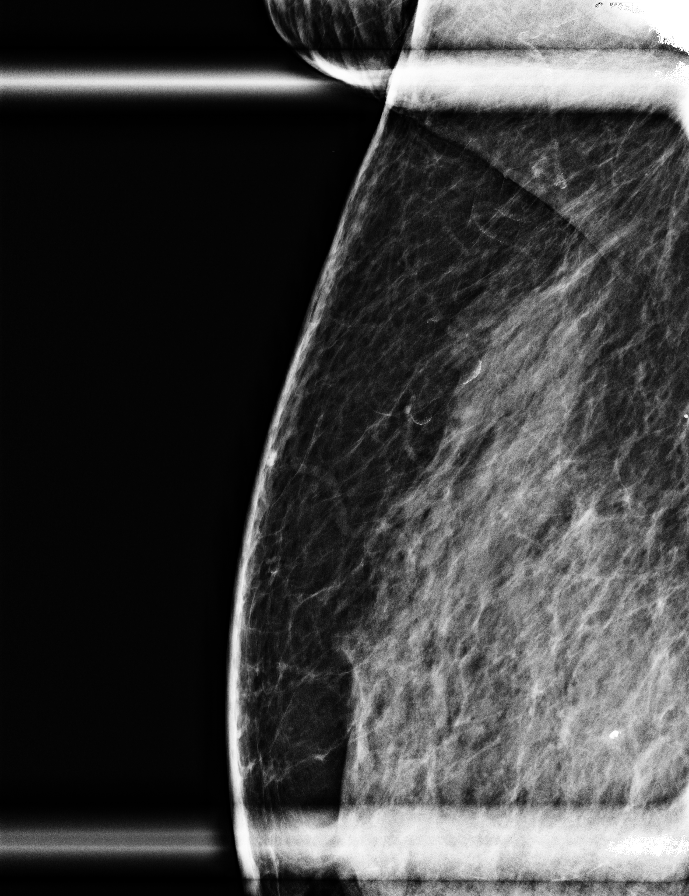

[R XCCL (2 of 2)]
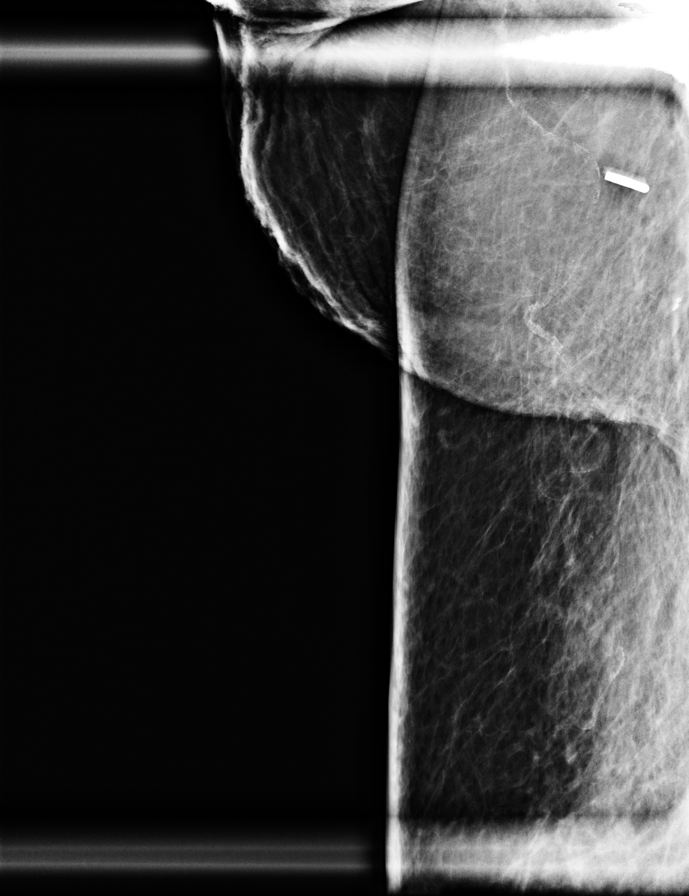

[R MLO]
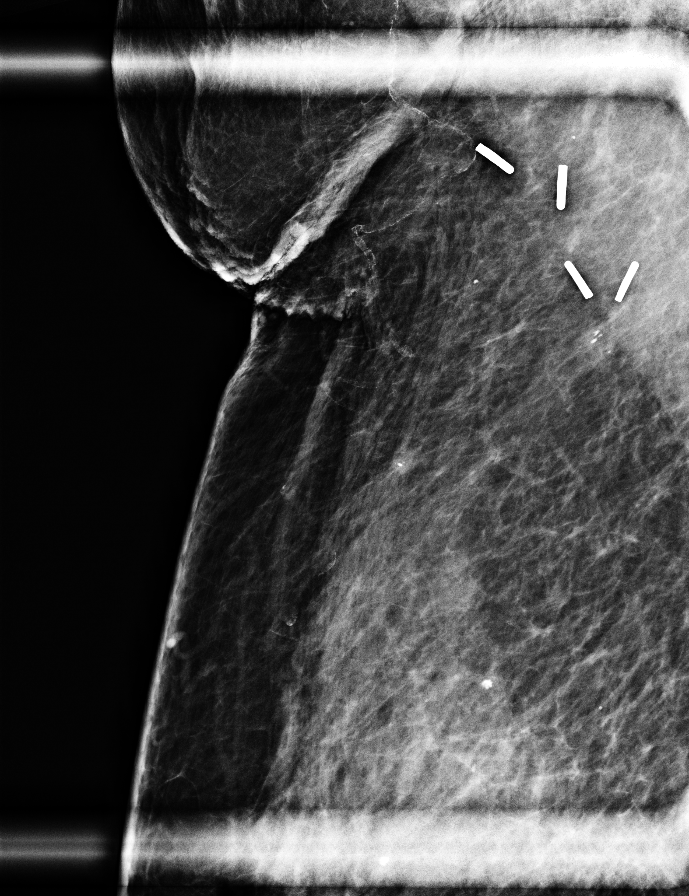

[R LM synth-2D]
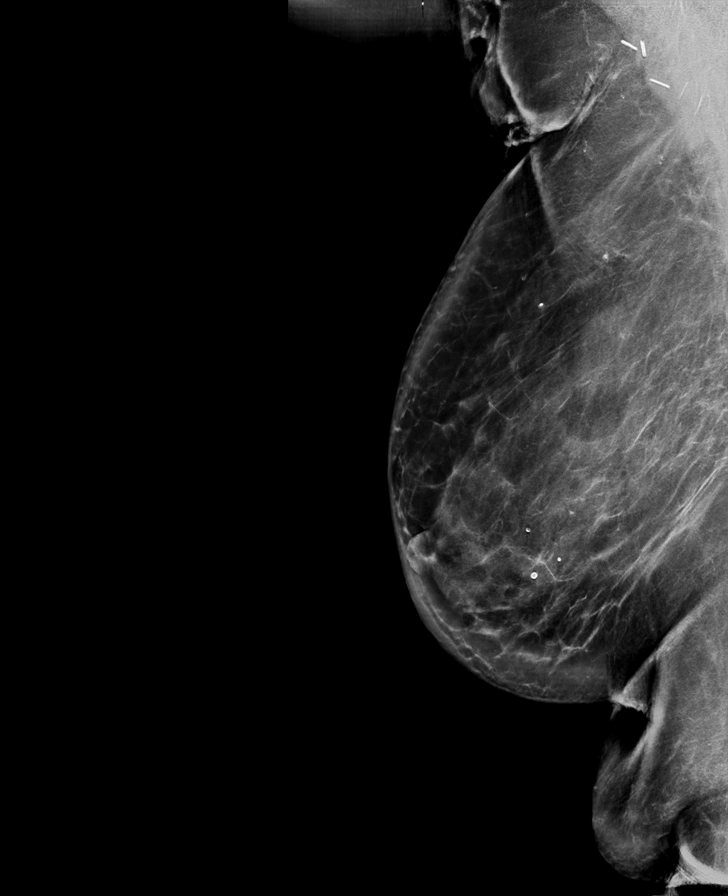

[L CC synth-2D]
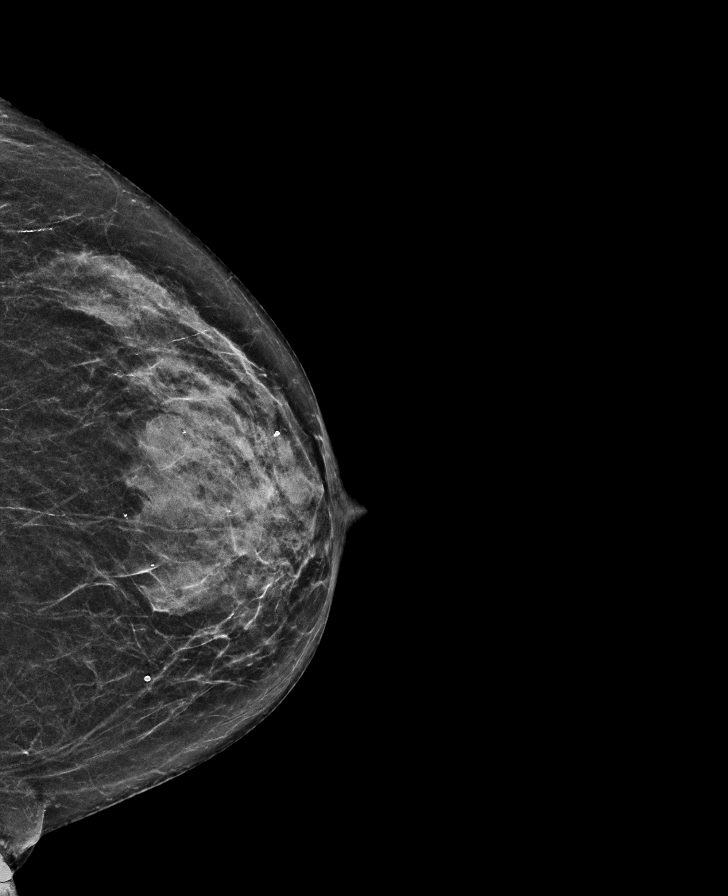

[R CC synth-2D]
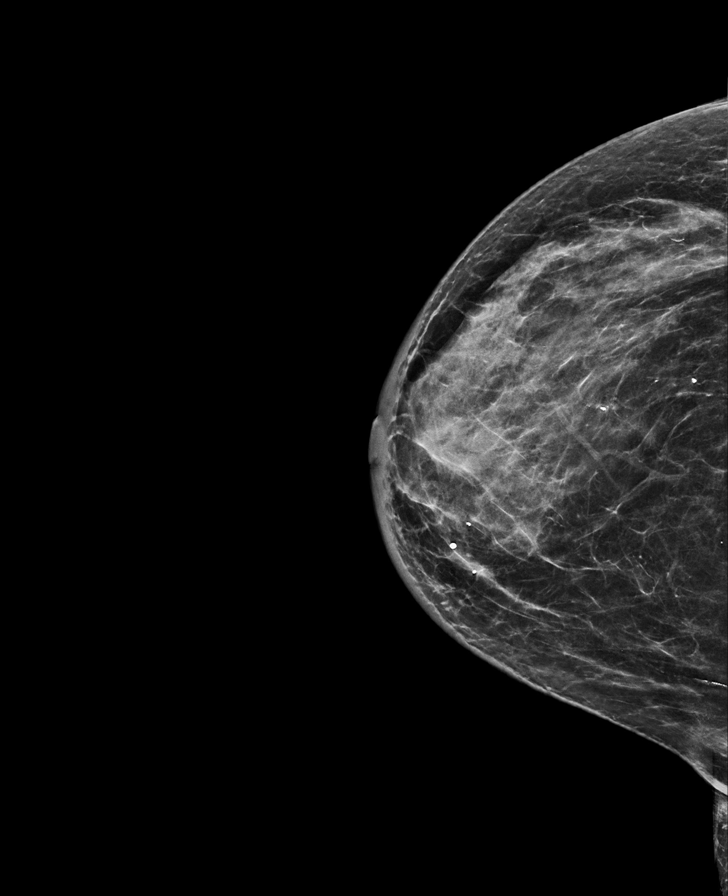

[L MLO synth-2D]
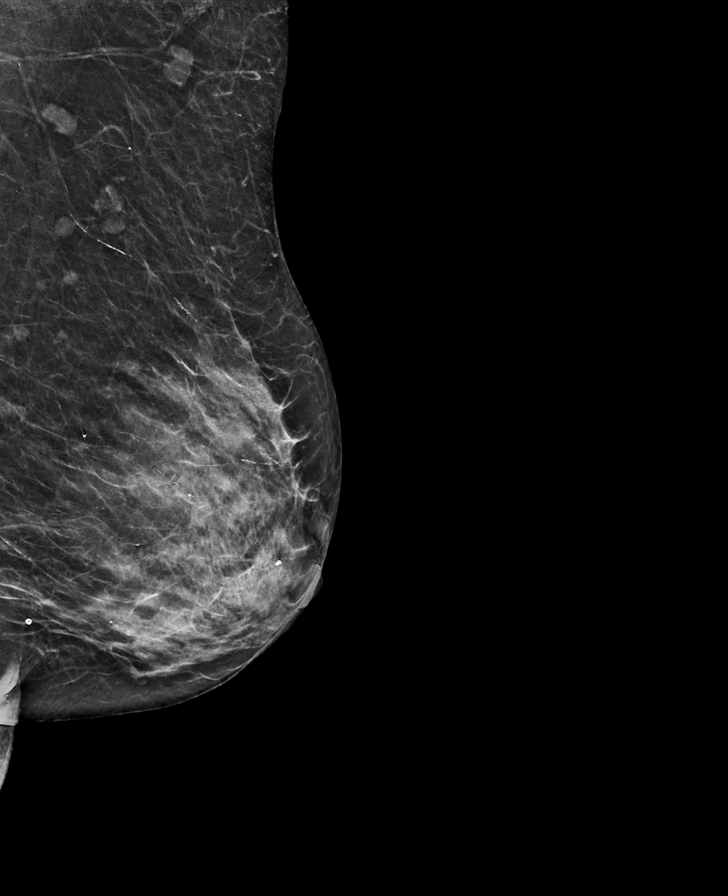

[R MLO synth-2D]
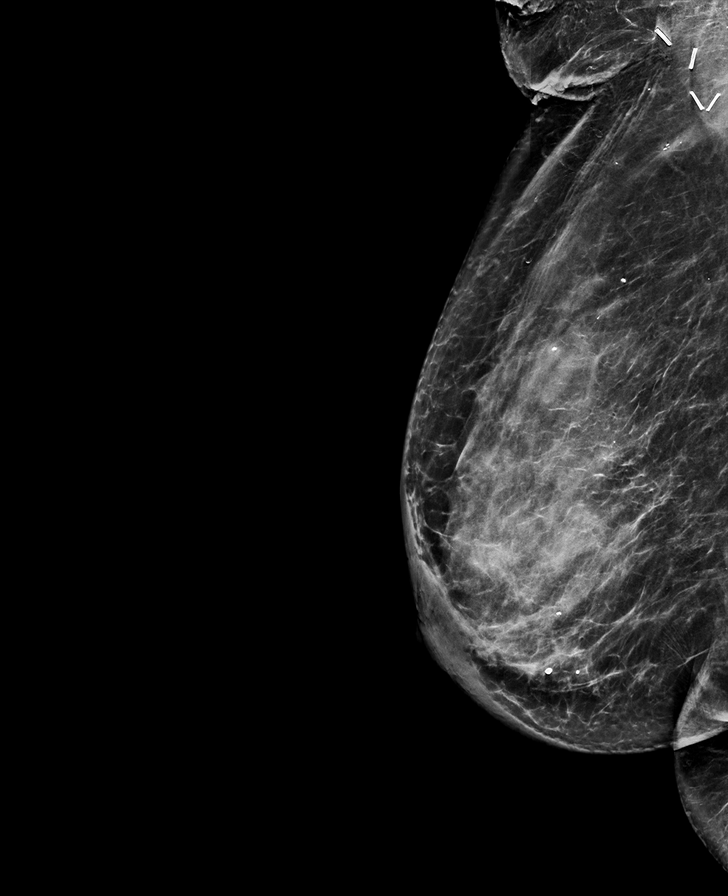

[8 of 39 positions shown; findings below may reference images not displayed]

ACR Breast Density Category c: The breast tissue is heterogeneously
dense, which may obscure small masses.
FINDINGS: RIGHT:

There is density and architectural distortion within the RIGHT
breast, consistent with postsurgical changes. There is a new group
of calcifications anterior and inferior the lumpectomy bed which
spans 3 mm. These are coarse in appearance. They are predominantly
only seen on MLO tomosynthesis and spot magnification views.
Attempts to visualize on exaggerated lateral images were minimally
successful. Multiple technicians held patient in place for LM
imaging.

Given location, targeted ultrasound was performed. No suspicious
cystic or solid mass is seen adjacent to the lumpectomy bed on
targeted ultrasound.

LEFT:

No mammographic evidence of malignancy.

Mammographic images were processed with CAD.
IMPRESSION: 1. New 3 mm group of calcifications anterior and inferior to the
lumpectomy bed, best appreciated on lateral and MLO imaging. These
likely reflect early dystrophic calcifications. Recommend follow-up
mammogram in 6 months to assess for evolution. If expected evolution
has not occurred on subsequent mammogram, stereotactic biopsy could
be considered but given deep location and challenging positioning,
the area may not be amenable to biopsy.

RECOMMENDATION:
Diagnostic RIGHT mammogram in 6 months.

I have discussed the findings and recommendations with the patient.
If applicable, a reminder letter will be sent to the patient
regarding the next appointment.

BI-RADS CATEGORY  3: Probably benign.

## 2020-01-09 ENCOUNTER — Other Ambulatory Visit: Payer: Self-pay | Admitting: Urology

## 2020-01-23 ENCOUNTER — Other Ambulatory Visit: Payer: Self-pay | Admitting: Cardiovascular Disease

## 2020-01-23 DIAGNOSIS — I2510 Atherosclerotic heart disease of native coronary artery without angina pectoris: Secondary | ICD-10-CM

## 2020-02-19 NOTE — Progress Notes (Deleted)
09/23/19 4:58 PM   Krista Cisneros 05-17-1940 [id number]  Referring provider: Sharyne Peach, MD [address] [place],  [place] [address] No chief complaint on file.  Urological History - Went to the ED (03/12/2018) for urinary retention; Hospitalized (03/14/2018); Foley placed following +955ml PVR - CT (03/15/2018) revealed left sided nephrolithiasis w/o obstructive uropathy - UA (03/26/2018); Mixed flora, probable contamination - Foley removed the week of (03/31/2018) by home health nurse.  -Hospitalized 10/07/19 with UTI and constipation, found to be urinary retention. Had a foley catheter placed by urology PA. -CT on 10/07/19 noted small nonobstructing stone left kidney; no hydronephrosis -Constipation has resolved   HPI: Krista Cisneros is a 79 y.o. female who presents today for follow up after Foley was removed two months ago.   The patient is  experiencing urgency x 0-3, frequency x 0-3, not restricting fluids to avoid visits to the restroom, is engaging in toilet mapping, incontinence x 0-3 and nocturia x 0-3.   Her BP is 128/83.   Her PVR is 0 mL.  Patient denies any modifying or aggravating factors.  Patient denies any gross hematuria, dysuria or suprapubic/flank pain.  Patient denies any fevers, chills, nausea or vomiting.   She is currently on Macrobid for an UTI after her CBC returned abnormal.  Patient denies any modifying or aggravating factors.  Patient denies any gross hematuria, dysuria or suprapubic/flank pain.  Patient denies any fevers, chills, nausea or vomiting.    PMH: Past Medical History:  Diagnosis Date  . Anxiety   . Arthritis   . CAD (coronary artery disease)    a. NSTEMI 12/19; b. LHC 04/10/18: pLAD 95%, mLAD 80%, m-dLCx 95%, OM3-1 lesion 40%, OM3-2 lesion 60%, mRCA 50%, EF 35-45%, successful PCI/DES x 2 to the LAD with recommended staged PCI of the LCx in a few weeks  . Cancer [facility])    Right Breast Cancer  . Chronic systolic CHF (congestive  heart failure) ([place])    a. TTE 12/19: EF 30-35%, anteroseptal, anterior, and apical HK, Gr1DD, mild to mod MR, mildly dilated LA, RVSF nl  . COPD (chronic obstructive pulmonary disease) ([place])   . Depression   . Dyspnea    with exertion  . GERD (gastroesophageal reflux disease)   . Hyperlipidemia   . Hypertension   . Personal history of chemotherapy 2018   chemo prior to lumpectomy of right breast  . Personal history of radiation therapy 2019   right breast ca    Surgical History: Past Surgical History:  Procedure Laterality Date  . ABDOMINAL HYSTERECTOMY  1990   Partial  . BREAST BIOPSY Right 10/04/2016   axilla lymph node (metastatic carcinoma) and axillay tail mass biopsy-invasive mammary carcinoma  . BREAST LUMPECTOMY Right 03/21/2017   chemo first, [facility] and metastatic LN  . BREAST LUMPECTOMY Right 05/21/2017   re-excision for clip  . BREAST LUMPECTOMY WITH NEEDLE LOCALIZATION Right 03/21/2017   Procedure: BREAST LUMPECTOMY WITH NEEDLE LOCALIZATION;  Surgeon: Clay[place] Pert, MD;  Location: [facility] ORS;  Service: General;  Laterality: Right;  . CORONARY STENT INTERVENTION N/A 04/10/2018   Procedure: CORONARY STENT INTERVENTION;  Surgeon: Wellington Hampshire, MD;  Location: [place] CV LAB;  Service: Cardiovascular;  Laterality: N/A;  . CORONARY STENT INTERVENTION N/A 05/14/2018   Procedure: CORONARY STENT INTERVENTION;  Surgeon: Nelva Bush, MD;  Location: [place] CV LAB;  Service: Cardiovascular;  Laterality: N/A;  . DILATION AND CURETTAGE OF UTERUS    . INGUINAL HERNIA REPAIR  Right 03/26/2017   Procedure: HERNIA REPAIR INGUINAL INCARCERATED;  Surgeon: Jules Husbands, MD;  Location: [facility] ORS;  Service: General;  Laterality: Right;  . LEFT HEART CATH AND CORONARY ANGIOGRAPHY N/A 04/10/2018   Procedure: LEFT HEART CATH AND CORONARY ANGIOGRAPHY poss PCI;  Surgeon: Minna Merritts, MD;  Location: [place] CV LAB;  Service: Cardiovascular;  Laterality: N/A;  .  PORTACATH PLACEMENT Left 10/24/2016   Procedure: INSERTION PORT-A-CATH;  Surgeon: [name], MD;  Location: [facility] ORS;  Service: General;  Laterality: Left;  . RE-EXCISION OF BREAST LUMPECTOMY Right 05/21/2017   Procedure: RE-EXCISION OF BREAST LUMPECTOMY;  Surgeon: [place] Pert, MD;  Location: [facility] ORS;  Service: General;  Laterality: Right;  . SENTINEL NODE BIOPSY Right 03/21/2017   Procedure: SENTINEL NODE BIOPSY;  Surgeon: Clayburn Pert, MD;  Location: [facility] ORS;  Service: General;  Laterality: Right;    Home Medications:  Allergies as of 02/20/2020      Reactions   Nsaids    Bleeding risk   Penicillins Anaphylaxis, Swelling, Rash   Has patient had a PCN reaction causing immediate rash, facial/tongue/throat swelling, SOB or lightheadedness with hypotension: Yes Has patient had a PCN reaction causing severe rash involving mucus membranes or skin necrosis: No Has patient had a PCN reaction that required hospitalization: No  Has patient had a PCN reaction occurring within the last 10 years: No If all of the above answers are "NO", then may proceed with Cephalosporin use.   Tamsulosin Nausea Only   Atorvastatin Other (See Comments)   Muscle Pain   Gabapentin Swelling   Ezetimibe Other (See Comments)   Muscle pain   Aspirin    GI bleeding risk      Medication List       Accurate as of February 19, 2020  4:58 PM. If you have any questions, ask your nurse or doctor.        Advair Diskus 500-50 MCG/DOSE Aepb Generic drug: Fluticasone-Salmeterol Inhale 1 puff into the lungs 2 (two) times daily.   albuterol 108 (90 Base) MCG/ACT inhaler Commonly known as: VENTOLIN HFA Inhale 2 puffs into the lungs every 6 (six) hours as needed for wheezing or shortness of breath.   aspirin EC 81 MG tablet Take 81 mg by mouth daily.   baclofen 10 MG tablet Commonly known as: LIORESAL Take 10 mg by mouth at bedtime.   Calcium-Vitamin D-Vitamin K 858-850-27 MG-UNT-MCG Tabs Take 1  tablet by mouth daily.   carvedilol 3.125 MG tablet Commonly known as: COREG Take 2 tablets (6.25 mg total) by mouth 2 (two) times daily.   cholecalciferol 25 MCG (1000 UNIT) tablet Commonly known as: VITAMIN D3 Take 1,000 Units by mouth daily.   clopidogrel 75 MG tablet Commonly known as: PLAVIX Take 1 tablet (75 mg total) by mouth daily with breakfast.   Coenzyme Q10 300 MG Caps Take 300 mg by mouth daily.   DULoxetine 30 MG capsule Commonly known as: CYMBALTA Take 30 mg by mouth every morning.   fluticasone 50 MCG/ACT nasal spray Commonly known as: FLONASE Place 2 sprays into the nose daily.   furosemide 20 MG tablet Commonly known as: Lasix Take 1 tablet (20 mg total) by mouth as needed for edema.   Klor-Con M20 20 MEQ tablet Generic drug: potassium chloride SA TAKE 1 TABLET BY MOUTH EVERY DAY   letrozole 2.5 MG tablet Commonly known as: FEMARA Take 2.5 mg by mouth daily.   lidocaine-prilocaine cream Commonly known as: EMLA Apply 1  application as needed topically (for port access).   magnesium oxide 400 MG tablet Commonly known as: MAG-OX Take 400 mg by mouth daily.   melatonin 5 MG Tabs Take 10 mg by mouth at bedtime as needed (sleep).   Melatonin-Pyridoxine 5-1 MG Tabs Take 1 tablet by mouth daily.   montelukast 10 MG tablet Commonly known as: SINGULAIR Take 10 mg by mouth at bedtime.   nitrofurantoin (macrocrystal-monohydrate) 100 MG capsule Commonly known as: MACROBID Take 100 mg by mouth 2 (two) times daily.   nitroGLYCERIN 0.4 MG SL tablet Commonly known as: NITROSTAT Place 1 tablet (0.4 mg total) under the tongue every 5 (five) minutes as needed for chest pain.   pantoprazole 40 MG tablet Commonly known as: PROTONIX Take 1 tablet (40 mg total) by mouth daily.   rosuvastatin 20 MG tablet Commonly known as: CRESTOR TAKE 1 TABLET BY MOUTH EVERY DAY AT 6PM   spironolactone 25 MG tablet Commonly known as: ALDACTONE Take 0.5 tablets (12.5  mg total) by mouth daily.       Allergies:  Allergies  Allergen Reactions  . Nsaids     Bleeding risk  . Penicillins Anaphylaxis, Swelling and Rash    Has patient had a PCN reaction causing immediate rash, facial/tongue/throat swelling, SOB or lightheadedness with hypotension: Yes Has patient had a PCN reaction causing severe rash involving mucus membranes or skin necrosis: No Has patient had a PCN reaction that required hospitalization: No  Has patient had a PCN reaction occurring within the last 10 years: No If all of the above answers are "NO", then may proceed with Cephalosporin use.   . Tamsulosin Nausea Only  . Atorvastatin Other (See Comments)    Muscle Pain  . Gabapentin Swelling  . Ezetimibe Other (See Comments)    Muscle pain  . Aspirin     GI bleeding risk    Family History: Family History  Problem Relation Age of Onset  . Colon cancer Mother   . Breast cancer Neg Hx     Social History:  reports that she quit smoking about 31 years ago. Her smoking use included cigarettes. She smoked 0.50 packs per day. She has never used smokeless tobacco. She reports current alcohol use. She reports that she does not use drugs.  Laboratory Data: Component     Latest Ref Rng & Units 12/01/2019  Glucose     65 - 99 mg/dL 109 (H)  BUN     8 - 27 mg/dL 29 (H)  Creatinine     0.57 - 1.00 mg/dL 1.47 (H)  GFR, Est Non African American     >59 mL/min/1.73 34 (L)  GFR, Est African American     >59 mL/min/1.73 39 (L)  BUN/Creatinine Ratio     12 - 28 20  Sodium     134 - 144 mmol/L 140  Potassium     3.5 - 5.2 mmol/L 4.8  Chloride     96 - 106 mmol/L 105  CO2     20 - 29 mmol/L 24  Calcium     8.7 - 10.3 mg/dL 9.7   Component     Latest Ref Rng & Units 10/31/2019  WBC     4.0 - 10.5 K/uL 10.0  RBC     3.87 - 5.11 MIL/uL 4.60  Hemoglobin     12.0 - 15.0 g/dL 12.4  HCT     36 - 46 % 39.8  MCV     80.0 - 100.0  fL 86.5  MCH     26.0 - 34.0 pg 27.0  MCHC     30.0  - 36.0 g/dL 31.2  RDW     11.5 - 15.5 % 20.1 (H)  Platelets     150 - 400 K/uL 336  nRBC     0.0 - 0.2 % 0.0  Neutrophils     % 56  NEUT#     1.7 - 7.7 K/uL 5.7  Lymphocytes     % 17  Lymphocyte #     0.7 - 4.0 K/uL 1.7  Monocytes Relative     % 12  Monocyte #     0.1 - 1.0 K/uL 1.2 (H)  Eosinophil     % 13  Eosinophils Absolute     0.0 - 0.5 K/uL 1.3 (H)  Basophil     % 1  Basophils Absolute     0.0 - 0.1 K/uL 0.1  Immature Granulocytes     % 1  Abs Immature Granulocytes     0.00 - 0.07 K/uL 0.07  Smear Review        Sodium     135 - 145 mmol/L   Potassium     3.5 - 5.1 mmol/L   Glucose     65 - 99 mg/dL   I have reviewed the labs.  Physical Exam: There were no vitals taken for this visit.  Constitutional:  Well nourished. Alert and oriented, No acute distress. HEENT: [place] AT, mask in place.  Trachea midline Cardiovascular: No clubbing, cyanosis, or edema. Respiratory: Normal respiratory effort, no increased work of breathing. Neurologic: Grossly intact, no focal deficits, moving all 4 extremities. Psychiatric: Normal mood and affect.   Assessment & Plan:    1. Urinary Retention  Resolved at this time RTC in three months to monitor PVR closely to prevent another episode of retention  [name] [address], [place], [place] [address] [phone]

## 2020-02-20 ENCOUNTER — Ambulatory Visit: Payer: Medicare Other | Admitting: Urology

## 2020-02-20 ENCOUNTER — Inpatient Hospital Stay: Payer: Medicare Other | Attending: Oncology

## 2020-02-20 ENCOUNTER — Other Ambulatory Visit: Payer: Self-pay

## 2020-02-20 ENCOUNTER — Encounter: Payer: Self-pay | Admitting: Urology

## 2020-02-20 DIAGNOSIS — Z452 Encounter for adjustment and management of vascular access device: Secondary | ICD-10-CM | POA: Diagnosis not present

## 2020-02-20 DIAGNOSIS — Z95828 Presence of other vascular implants and grafts: Secondary | ICD-10-CM

## 2020-02-20 DIAGNOSIS — C50411 Malignant neoplasm of upper-outer quadrant of right female breast: Secondary | ICD-10-CM | POA: Insufficient documentation

## 2020-02-20 DIAGNOSIS — R339 Retention of urine, unspecified: Secondary | ICD-10-CM

## 2020-02-20 MED ORDER — HEPARIN SOD (PORK) LOCK FLUSH 100 UNIT/ML IV SOLN
INTRAVENOUS | Status: AC
  Filled 2020-02-20: qty 5

## 2020-02-20 MED ORDER — HEPARIN SOD (PORK) LOCK FLUSH 100 UNIT/ML IV SOLN
500.0000 [IU] | Freq: Once | INTRAVENOUS | Status: AC
  Administered 2020-02-20: 500 [IU] via INTRAVENOUS
  Filled 2020-02-20: qty 5

## 2020-02-20 MED ORDER — SODIUM CHLORIDE 0.9% FLUSH
10.0000 mL | Freq: Once | INTRAVENOUS | Status: AC
  Administered 2020-02-20: 10 mL via INTRAVENOUS
  Filled 2020-02-20: qty 10

## 2020-03-15 ENCOUNTER — Other Ambulatory Visit: Payer: Self-pay | Admitting: Urology

## 2020-03-15 ENCOUNTER — Other Ambulatory Visit: Payer: Self-pay | Admitting: Nurse Practitioner

## 2020-03-15 ENCOUNTER — Other Ambulatory Visit: Payer: Self-pay | Admitting: Cardiovascular Disease

## 2020-03-15 DIAGNOSIS — I2510 Atherosclerotic heart disease of native coronary artery without angina pectoris: Secondary | ICD-10-CM

## 2020-03-31 NOTE — Progress Notes (Signed)
Cardiology Office Note  Date:  04/05/2020   ID:  Krista Cisneros, DOB 02/26/1941, MRN [id number]  PCP:  Sharyne Peach, MD   Chief Complaint  Patient presents with  . Follow-up    4-5 month F/U; Meds verbally reviewed with patient.    HPI: Ms. Trovato is a 79 y.o. 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,  HTN,  HLD,  breast cancer s/p chemoradiation,  GERD admission to [facility] from 04/09/18 to 04/12/18 for NSTEMI, successful PCI/DES x 2 to the LAD at that time,  cardiac catheterization with stent to the left circumflex 04/2018 Who presents for CAD, prior stents  Reports having some episodes of chest pain, Seems to come on more at rest, less with exertion Does not want any further testing given no escalation in her symptoms  Does not do any regular exercise, family who presents with her reports that she has been getting weaker and they are concerned  Denies any recent falls  Lab work reviewed with her Total chol 109,  LDL 51 HBA1C 5.6  Other past medical history reviewed Echo 08/2018,  ejection fraction of 50 to 55%.  Prior EKG personally reviewed by me today  Showing normal sinus rhythm, rate rate 81 bpm, no significant ST or T wave changes  Other past medical history reviewed cath 04/10/18 Multivessel disease Severe mid LAD disease 95%, mid to distal stenosis as well 80% Proximal OM 3 disease large vessel 95% Moderate RCA disease 50% mid vessel  Successful angioplasty and drug-eluting stent placement to the LAD x2.  1. 90% mid LCx stenosis, unchanged from catheterization last month. 2. Widely patent proximal and mid LAD stents. 3. Successful PCI to mid LCx using Resolute Onyx 2.25 x 15 mm drug-eluting stent (postdilated to 2.6 mm) with 0% residual stenosis and TIMI-3 flow.   On 05/14/2018 NSTEMI along with chest pain and in  worsening fatigue.   cardiac catheterization results detailed below, discussed with her 2 stents to the LAD, staged procedure with follow-up stent to the left circumflex   Ejection fraction of 30 to 35% by echocardiogram April 10, 2018   PMH:   has a past medical history of Anxiety, Arthritis, CAD (coronary artery disease), Cancer ([place]), Chronic systolic CHF (congestive heart failure) ([place]), COPD (chronic obstructive pulmonary disease) ([place]), Depression, Dyspnea, GERD (gastroesophageal reflux disease), Hyperlipidemia, Hypertension, Personal history of chemotherapy (2018), and Personal history of radiation therapy (2019).  PSH:    Past Surgical History:  Procedure Laterality Date  . ABDOMINAL HYSTERECTOMY  1990   Partial  . BREAST BIOPSY Right 10/04/2016   axilla lymph node (metastatic carcinoma) and axillay tail mass biopsy-invasive mammary carcinoma  . BREAST LUMPECTOMY Right 03/21/2017   chemo first, [facility] and metastatic LN  . BREAST LUMPECTOMY Right 05/21/2017   re-excision for clip  . BREAST LUMPECTOMY WITH NEEDLE LOCALIZATION Right 03/21/2017   Procedure: BREAST LUMPECTOMY WITH NEEDLE LOCALIZATION;  Surgeon: Clayburn Pert, MD;  Location: [facility];  Service: General;  Laterality: Right;  . CORONARY STENT INTERVENTION N/A 04/10/2018   Procedure: CORONARY STENT INTERVENTION;  Surgeon: Wellington Hampshire, MD;  Location: [place] [facility];  Service: Cardiovascular;  Laterality: N/A;  . CORONARY STENT INTERVENTION N/A 05/14/2018   Procedure: CORONARY STENT INTERVENTION;  Surgeon: Nelva Bush, MD;  Location: [place] [facility];  Service: Cardiovascular;  Laterality: N/A;  .  DILATION AND CURETTAGE OF UTERUS    . INGUINAL HERNIA REPAIR Right 03/26/2017   Procedure: HERNIA REPAIR INGUINAL INCARCERATED;  Surgeon: Jules Husbands, MD;  Location: [facility];  Service: General;  Laterality: Right;  . LEFT HEART CATH AND CORONARY ANGIOGRAPHY N/A 04/10/2018   Procedure: LEFT HEART CATH  AND CORONARY ANGIOGRAPHY poss PCI;  Surgeon: Minna Merritts, MD;  Location: [place] [facility];  Service: Cardiovascular;  Laterality: N/A;  . PORTACATH PLACEMENT Left 10/24/2016   Procedure: INSERTION PORT-A-CATH;  Surgeon: [name], MD;  Location: [facility];  Service: General;  Laterality: Left;  . RE-EXCISION OF BREAST LUMPECTOMY Right 05/21/2017   Procedure: RE-EXCISION OF BREAST LUMPECTOMY;  Surgeon: Clayburn Pert, MD;  Location: [facility];  Service: General;  Laterality: Right;  . SENTINEL NODE BIOPSY Right 03/21/2017   Procedure: SENTINEL NODE BIOPSY;  Surgeon: Clayburn Pert, MD;  Location: [facility];  Service: General;  Laterality: Right;    Current Outpatient Medications  Medication Sig Dispense Refill  . ADVAIR DISKUS 500-50 MCG/DOSE AEPB Inhale 1 puff into the lungs 2 (two) times daily.     Marland Kitchen albuterol (PROVENTIL HFA;VENTOLIN HFA) 108 (90 Base) MCG/ACT inhaler Inhale 2 puffs into the lungs every 6 (six) hours as needed for wheezing or shortness of breath.    Marland Kitchen aspirin EC 81 MG tablet Take 81 mg by mouth daily.    . baclofen (LIORESAL) 10 MG tablet Take 10 mg by mouth at bedtime.  9  . Calcium-Vitamin D-Vitamin K 950-932-67 MG-UNT-MCG TABS Take 1 tablet by mouth daily.    . carvedilol (COREG) 3.125 MG tablet Take 2 tablets (6.25 mg total) by mouth 2 (two) times daily. 30 tablet 1  . cholecalciferol (VITAMIN D3) 25 MCG (1000 UT) tablet Take 1,000 Units by mouth daily.    . clopidogrel (PLAVIX) 75 MG tablet Take 1 tablet (75 mg total) by mouth daily with breakfast. 90 tablet 4  . Coenzyme Q10 300 MG CAPS Take 300 mg by mouth daily.     . DULoxetine (CYMBALTA) 30 MG capsule Take 30 mg by mouth every morning.   11  . DULoxetine (CYMBALTA) 60 MG capsule Take 60 mg by mouth daily.    . fluticasone (FLONASE) 50 MCG/ACT nasal spray Place 2 sprays into the nose daily.    . furosemide (LASIX) 20 MG tablet Take 1 tablet (20 mg total) by mouth as needed for edema. 30 tablet 11  .  KLOR-CON M20 20 MEQ tablet TAKE 1 TABLET BY MOUTH EVERY DAY 90 tablet 1  . letrozole (FEMARA) 2.5 MG tablet TAKE 1 TABLET BY MOUTH EVERY DAY 90 tablet 3  . lidocaine-prilocaine (EMLA) cream Apply 1 application as needed topically (for port access).    . magnesium oxide (MAG-OX) 400 MG tablet Take 400 mg by mouth daily.    . Melatonin 5 MG TABS Take 10 mg by mouth at bedtime as needed (sleep).    . Melatonin-Pyridoxine 5-1 MG TABS Take 1 tablet by mouth daily.    . montelukast (SINGULAIR) 10 MG tablet Take 10 mg by mouth at bedtime.     . nitroGLYCERIN (NITROSTAT) 0.4 MG SL tablet Place 1 tablet (0.4 mg total) under the tongue every 5 (five) minutes as needed for chest pain. 25 tablet 6  . pantoprazole (PROTONIX) 40 MG tablet TAKE 1 TABLET BY MOUTH EVERY DAY 90 tablet 0  . rosuvastatin (CRESTOR) 20 MG tablet TAKE 1 TABLET BY MOUTH EVERY DAY AT 6PM 90 tablet 0  .  spironolactone (ALDACTONE) 25 MG tablet Take 0.5 tablets (12.5 mg total) by mouth daily. 90 tablet 1   No current facility-administered medications for this visit.   Facility-Administered Medications Ordered in Other Visits  Medication Dose Route Frequency Provider Last Rate Last Admin  . heparin lock flush 100 unit/mL  500 Units Intravenous Once Lloyd Huger, MD      . ondansetron [facility]) 8 mg in sodium chloride 0.9 % 50 mL IVPB   Intravenous Once Lloyd Huger, MD         Allergies:   Nsaids, Penicillins, Tamsulosin, Atorvastatin, Gabapentin, Ezetimibe, and Aspirin   Social History:  The patient  reports that she quit smoking about 31 years ago. Her smoking use included cigarettes. She smoked 0.50 packs per day. She has never used smokeless tobacco. She reports current alcohol use. She reports that she does not use drugs.   Family History:   family history includes Colon cancer in her mother.    Review of Systems: Review of Systems  Constitutional: Negative.   HENT: Negative.   Respiratory: Negative.  Negative  for shortness of breath.   Cardiovascular: Negative.  Negative for chest pain.  Gastrointestinal: Negative.   Genitourinary: Negative.   Musculoskeletal: Negative.   Neurological: Negative.   Psychiatric/Behavioral: Negative.   All other systems reviewed and are negative.   PHYSICAL EXAM: VS:  BP 140/82 (BP Location: Left Arm, Patient Position: Sitting, Cuff Size: Normal)   Pulse 84   Ht 5\' 2"  (1.575 m)   Wt 154 lb (69.9 kg)   BMI 28.17 kg/m  , BMI Body mass index is 28.17 kg/m.  Constitutional:  oriented to person, place, and time. No distress.  HENT:  Head: Grossly normal Eyes:  no discharge. No scleral icterus.  Neck: No JVD, no carotid bruits  Cardiovascular: Regular rate and rhythm, no murmurs appreciated Pulmonary/Chest: Clear to auscultation bilaterally, no wheezes or rails Abdominal: Soft.  no distension.  no tenderness.  Musculoskeletal: Normal range of motion Neurological:  normal muscle tone. Coordination normal. No atrophy Skin: Skin warm and dry Psychiatric: normal affect, pleasant   Recent Labs: 10/07/2019: ALT 16; Magnesium 3.1 10/31/2019: Hemoglobin 12.4; Platelets 336 12/01/2019: BUN 29; Creatinine, Ser 1.47; Potassium 4.8; Sodium 140    Lipid Panel Lab Results  Component Value Date   CHOL 189 04/10/2018   HDL 40 (L) 04/10/2018   LDLCALC 134 (H) 04/10/2018   TRIG 77 04/10/2018     Wt Readings from Last 3 Encounters:  04/05/20 154 lb (69.9 kg)  12/01/19 148 lb 2 oz (67.2 kg)  11/14/19 147 lb (66.7 kg)       ASSESSMENT AND PLAN:  Coronary artery disease involving native coronary artery of native heart with chronic stable angina pectoris (HCC) With occasional episodes of chest pain concerning for chronic stable angina No medication changes, recommend cardiac rehab  Non-STEMI (non-ST elevated myocardial infarction) (HCC) Prior ejection fraction 30 to 35% Repeat echocardiogram normal 50 to 55% Appears relatively euvolemic on today's  visit  Centrilobular emphysema (HCC) Stable Stopped smoking Stable mild shortness of breath on exertion, sedentary at baseline  CRI Followed by primary care  Debility Legs weak, no regular exercise program Will put in request to enroll in cardiac rehab Family in agreement   Total encounter time more than 25 minutes. Greater than 50% was spent in counseling and coordination of care with the patient.   No orders of the defined types were placed in this encounter.  Carmela Hurt  am acting as a scribe for Ida Rogue, M.D., Ph.D.  I have reviewed the above documentation for accuracy and completeness, and I agree with the above.   Signed, Esmond Plants, M.D., Ph.D. 04/05/2020  [place], [place]

## 2020-04-05 ENCOUNTER — Ambulatory Visit: Payer: Medicare Other | Admitting: Cardiovascular Disease

## 2020-04-05 ENCOUNTER — Encounter: Payer: Self-pay | Admitting: Cardiovascular Disease

## 2020-04-05 VITALS — BP 140/82 | HR 84 | Ht 62.0 in | Wt 154.0 lb

## 2020-04-05 DIAGNOSIS — I25119 Atherosclerotic heart disease of native coronary artery with unspecified angina pectoris: Secondary | ICD-10-CM

## 2020-04-05 DIAGNOSIS — J432 Centrilobular emphysema: Secondary | ICD-10-CM

## 2020-04-05 DIAGNOSIS — I5022 Chronic systolic (congestive) heart failure: Secondary | ICD-10-CM | POA: Diagnosis not present

## 2020-04-05 DIAGNOSIS — I25118 Atherosclerotic heart disease of native coronary artery with other forms of angina pectoris: Secondary | ICD-10-CM

## 2020-04-05 DIAGNOSIS — I10 Essential (primary) hypertension: Secondary | ICD-10-CM

## 2020-04-05 DIAGNOSIS — I255 Ischemic cardiomyopathy: Secondary | ICD-10-CM

## 2020-04-05 DIAGNOSIS — E785 Hyperlipidemia, unspecified: Secondary | ICD-10-CM

## 2020-04-05 NOTE — Patient Instructions (Addendum)
Medication Instructions:  No changes  If you need a refill on your cardiac medications before your next appointment, please call your pharmacy.    Lab work: No new labs needed   If you have labs (blood work) drawn today and your tests are completely normal, you will receive your results only by: Marland Kitchen MyChart Message (if you have MyChart) OR . A paper copy in the mail If you have any lab test that is abnormal or we need to change your treatment, we will call you to review the results.   Testing/Procedures: No new testing needed   Follow-Up: At [facility], you and your health needs are our priority.  As part of our continuing mission to provide you with exceptional heart care, we have created designated Provider Care Teams.  These Care Teams include your primary Cardiologist (physician) and Advanced Practice Providers (APPs -  Physician Assistants and Nurse Practitioners) who all work together to provide you with the care you need, when you need it.  . You will need a follow up appointment in 6 months  . Providers on your designated Care Team:   . Murray Hodgkins, NP . Christell Faith, PA-C . Marrianne Mood, PA-C  Any Other Special Instructions Will Be Listed Below (If Applicable).  A referral to cardiac rehab for CAD and chronic stable angina has been placed, someone from that clinic will call you for consult/appointment   COVID-19 Vaccine Information can be found at: [email or web address] For questions related to vaccine distribution or appointments, please email [email or web address]@ [email or web address] or call [phone].

## 2020-05-01 NOTE — Progress Notes (Signed)
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  INTERVAL HISTORY: Patient returns to clinic today for routine 20-month evaluation and consideration of additional Venofer.  She has worsening weakness and fatigue secondary to recent infection with COVID.  She has noticed new hot flashes possibly related to her letrozole, but otherwise is tolerating her treatment well. She has no neurologic complaints. She denies any recent fevers or illnesses.  She denies any chest pain, cough, or hemoptysis.  She denies any nausea, vomiting, constipation, or diarrhea.  She denies any melena or hematochezia.  She has no urinary complaints.  Patient offers no further specific complaints today.  REVIEW OF SYSTEMS:   Review of Systems  Constitutional: Positive for malaise/fatigue. Negative for fever and weight loss.  Respiratory: Positive for shortness of breath. Negative for cough.   Cardiovascular: Negative.  Negative for chest pain and leg swelling.  Gastrointestinal: Negative.  Negative for abdominal pain, constipation, diarrhea and nausea.  Genitourinary: Negative.  Negative for dysuria and hematuria.  Musculoskeletal: Negative.  Negative for joint pain.  Skin: Negative.  Negative for rash.  Neurological: Positive for sensory change and weakness. Negative for focal weakness and headaches.   Psychiatric/Behavioral: Negative.  The patient is not nervous/anxious.     As per HPI. Otherwise, a complete review of systems is negative.  PAST MEDICAL HISTORY: Past Medical History:  Diagnosis Date  . Anxiety   . Arthritis   . CAD (coronary artery disease)    a. NSTEMI 12/19; b. LHC 04/10/18: pLAD 95%, mLAD 80%, m-dLCx 95%, OM3-1 lesion 40%, OM3-2 lesion 60%, mRCA 50%, EF 35-45%, successful PCI/DES x 2 to the LAD with recommended staged PCI of the LCx in a few weeks  . Cancer [facility])    Right Breast Cancer  . Chronic systolic CHF (congestive heart failure) ([place])    a. TTE 12/19: EF 30-35%, anteroseptal, anterior, and apical HK, Gr1DD, mild to mod MR, mildly dilated LA, RVSF nl  . COPD (chronic obstructive pulmonary disease) ([place])   . Depression   . Dyspnea    with exertion  . GERD (gastroesophageal reflux disease)   . Hyperlipidemia   . Hypertension   . Personal history of chemotherapy 2018   chemo prior to lumpectomy of right breast  . Personal history of radiation therapy 2019   right breast ca    PAST SURGICAL HISTORY: Past Surgical History:  Procedure Laterality Date  . ABDOMINAL HYSTERECTOMY  1990   Partial  . BREAST BIOPSY Right 10/04/2016   axilla lymph node (metastatic carcinoma) and axillay tail mass biopsy-invasive mammary carcinoma  . BREAST LUMPECTOMY Right 03/21/2017   chemo first, [facility] and metastatic LN  . BREAST LUMPECTOMY Right 05/21/2017   re-excision for clip  . BREAST LUMPECTOMY WITH NEEDLE LOCALIZATION Right 03/21/2017   Procedure: BREAST LUMPECTOMY WITH NEEDLE LOCALIZATION;  Surgeon: Clayburn Pert, MD;  Location: [facility] ORS;  Service: General;  Laterality: Right;  . CORONARY STENT INTERVENTION N/A 04/10/2018   Procedure: CORONARY STENT  INTERVENTION;  Surgeon: Wellington Hampshire, MD;  Location: [place] CV LAB;  Service: Cardiovascular;  Laterality: N/A;  . CORONARY STENT INTERVENTION N/A 05/14/2018   Procedure: CORONARY STENT INTERVENTION;   Surgeon: Nelva Bush, MD;  Location: [place] CV LAB;  Service: Cardiovascular;  Laterality: N/A;  . DILATION AND CURETTAGE OF UTERUS    . INGUINAL HERNIA REPAIR Right 03/26/2017   Procedure: HERNIA REPAIR INGUINAL INCARCERATED;  Surgeon: Jules Husbands, MD;  Location: [facility] ORS;  Service: General;  Laterality: Right;  . LEFT HEART CATH AND CORONARY ANGIOGRAPHY N/A 04/10/2018   Procedure: LEFT HEART CATH AND CORONARY ANGIOGRAPHY poss PCI;  Surgeon: Minna Merritts, MD;  Location: [place] CV LAB;  Service: Cardiovascular;  Laterality: N/A;  . PORTACATH PLACEMENT Left 10/24/2016   Procedure: INSERTION PORT-A-CATH;  Surgeon: [name], MD;  Location: [facility] ORS;  Service: General;  Laterality: Left;  . RE-EXCISION OF BREAST LUMPECTOMY Right 05/21/2017   Procedure: RE-EXCISION OF BREAST LUMPECTOMY;  Surgeon: Clayburn Pert, MD;  Location: [facility] ORS;  Service: General;  Laterality: Right;  . SENTINEL NODE BIOPSY Right 03/21/2017   Procedure: SENTINEL NODE BIOPSY;  Surgeon: Clayburn Pert, MD;  Location: [facility] ORS;  Service: General;  Laterality: Right;    FAMILY HISTORY: Family History  Problem Relation Age of Onset  . Colon cancer Mother   . Breast cancer Neg Hx     ADVANCED DIRECTIVES (Y/N):  N  HEALTH MAINTENANCE: Social History   Tobacco Use  . Smoking status: Former Smoker    Packs/day: 0.50    Types: Cigarettes    Quit date: 07/23/1988    Years since quitting: 31.8  . Smokeless tobacco: Never Used  Vaping Use  . Vaping Use: Never used  Substance Use Topics  . Alcohol use: Yes    Comment: rare  . Drug use: No     Colonoscopy:  PAP:  Bone density:  Lipid panel:  Allergies  Allergen Reactions  . Nsaids     Bleeding risk  . Penicillins Anaphylaxis, Swelling and Rash    Has patient had a PCN reaction causing immediate rash, facial/tongue/throat swelling, SOB or lightheadedness with hypotension: Yes Has patient had a PCN reaction causing severe rash  involving mucus membranes or skin necrosis: No Has patient had a PCN reaction that required hospitalization: No  Has patient had a PCN reaction occurring within the last 10 years: No If all of the above answers are "NO", then may proceed with Cephalosporin use.   . Tamsulosin Nausea Only  . Atorvastatin Other (See Comments)    Muscle Pain  . Gabapentin Swelling  . Ezetimibe Other (See Comments)    Muscle pain  . Aspirin     GI bleeding risk    Current Outpatient Medications  Medication Sig Dispense Refill  . ADVAIR DISKUS 500-50 MCG/DOSE AEPB Inhale 1 puff into the lungs 2 (two) times daily.     Marland Kitchen albuterol (PROVENTIL HFA;VENTOLIN HFA) 108 (90 Base) MCG/ACT inhaler Inhale 2 puffs into the lungs every 6 (six) hours as needed for wheezing or shortness of breath.    Marland Kitchen aspirin EC 81 MG tablet Take 81 mg by mouth daily.    . baclofen (LIORESAL) 10 MG tablet Take 10 mg by mouth at bedtime.  9  . Calcium-Vitamin D-Vitamin K 782-956-21 MG-UNT-MCG TABS Take 1 tablet by mouth daily.    . carvedilol (COREG) 3.125 MG tablet Take 2 tablets (6.25 mg total) by mouth 2 (two) times daily. 30 tablet 1  .  cholecalciferol (VITAMIN D3) 25 MCG (1000 UT) tablet Take 1,000 Units by mouth daily.    . clopidogrel (PLAVIX) 75 MG tablet Take 1 tablet (75 mg total) by mouth daily with breakfast. 90 tablet 4  . Coenzyme Q10 300 MG CAPS Take 300 mg by mouth daily.     . DULoxetine (CYMBALTA) 30 MG capsule Take 30 mg by mouth every morning.   11  . DULoxetine (CYMBALTA) 60 MG capsule Take 60 mg by mouth daily.    . fluticasone (FLONASE) 50 MCG/ACT nasal spray Place 2 sprays into the nose daily.    . furosemide (LASIX) 20 MG tablet Take 1 tablet (20 mg total) by mouth as needed for edema. 30 tablet 11  . KLOR-CON M20 20 MEQ tablet TAKE 1 TABLET BY MOUTH EVERY DAY 90 tablet 1  . letrozole (FEMARA) 2.5 MG tablet TAKE 1 TABLET BY MOUTH EVERY DAY 90 tablet 3  . lidocaine-prilocaine (EMLA) cream Apply 1 application as  needed topically (for port access).    . magnesium oxide (MAG-OX) 400 MG tablet Take 400 mg by mouth daily.    . Melatonin 5 MG TABS Take 10 mg by mouth at bedtime as needed (sleep).    . Melatonin-Pyridoxine 5-1 MG TABS Take 1 tablet by mouth daily.    . montelukast (SINGULAIR) 10 MG tablet Take 10 mg by mouth at bedtime.     . nitroGLYCERIN (NITROSTAT) 0.4 MG SL tablet Place 1 tablet (0.4 mg total) under the tongue every 5 (five) minutes as needed for chest pain. 25 tablet 6  . pantoprazole (PROTONIX) 40 MG tablet TAKE 1 TABLET BY MOUTH EVERY DAY 90 tablet 0  . rosuvastatin (CRESTOR) 20 MG tablet TAKE 1 TABLET BY MOUTH EVERY DAY AT 6PM 90 tablet 0  . spironolactone (ALDACTONE) 25 MG tablet Take 0.5 tablets (12.5 mg total) by mouth daily. 90 tablet 1   No current facility-administered medications for this visit.   Facility-Administered Medications Ordered in Other Visits  Medication Dose Route Frequency Provider Last Rate Last Admin  . heparin lock flush 100 unit/mL  500 Units Intravenous Once Lloyd Huger, MD      . ondansetron (ZOFRAN) 8 mg in sodium chloride 0.9 % 50 mL IVPB   Intravenous Once Lloyd Huger, MD        OBJECTIVE: Vitals:   05/04/20 1328  BP: 128/67  Pulse: 88  Temp: 98.9 F (37.2 C)  SpO2: 100%     Body mass index is 27.18 kg/m.    ECOG FS:0 - Asymptomatic  General: Well-developed, well-nourished, no acute distress. Eyes: Pink conjunctiva, anicteric sclera. HEENT: Normocephalic, moist mucous membranes. Lungs: No audible wheezing or coughing. Heart: Regular rate and rhythm. Abdomen: Soft, nontender, no obvious distention. Musculoskeletal: No edema, cyanosis, or clubbing. Neuro: Alert, answering all questions appropriately. Cranial nerves grossly intact. Skin: No rashes or petechiae noted. Psych: Normal affect.  LAB RESULTS:  Lab Results  Component Value Date   NA 135 05/04/2020   K 4.5 05/04/2020   CL 103 05/04/2020   CO2 22 05/04/2020    GLUCOSE 152 (H) 05/04/2020   BUN 26 (H) 05/04/2020   CREATININE 1.39 (H) 05/04/2020   CALCIUM 8.5 (L) 05/04/2020   PROT 7.6 10/07/2019   ALBUMIN 3.3 (L) 10/07/2019   AST 30 10/07/2019   ALT 16 10/07/2019   ALKPHOS 59 10/07/2019   BILITOT 0.8 10/07/2019   GFRNONAA 39 (L) 05/04/2020   GFRAA 39 (L) 12/01/2019    Lab Results  Component Value Date   WBC 11.1 (H) 05/04/2020   NEUTROABS 7.7 05/04/2020   HGB 11.5 (L) 05/04/2020   HCT 38.0 05/04/2020   MCV 81.9 05/04/2020   PLT 407 (H) 05/04/2020   Lab Results  Component Value Date   IRON 47 05/04/2020   TIBC 288 05/04/2020   IRONPCTSAT 16 05/04/2020   Lab Results  Component Value Date   FERRITIN 82 05/04/2020     STUDIES: No results found.  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  PLAN:    1. 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  Treatment was discontinued secondary to declining performance status.  Patient underwent lumpectomy on March 21, 2017, but no biopsy clip was seen in her surgical specimen.  She had repeat surgery on May 21, 2017 to remove her retained clip, which confirmed a complete pathological response. Patient completed her adjuvant XRT on September 27, 2017.  Continue letrozole for total 5 years completing treatment in February 2024.  Patient's most recent mammogram on January 01, 2020 reported a new 3 mm group of calcifications in the right breast.  Recommendation was to repeat unilateral diagnostic right breast mammogram in March 2022.  Return to clinic in 6 months for routine evaluation.   3. Hypokalemia: Resolved.  Continue 20 mEq oral potassium supplementation daily. 4.  Iron deficiency anemia: Patient's hemoglobin has trended down slightly to 11.5, but her iron stores  remain within normal limits.  She will receive 1 dose of 200 mg IV Venofer today.  Return to clinic in 6 months with laboratory work as above. 5.  Bone health: Bone mineral density on January 01, 2020 reported T score of -0.8 which is considered normal.  Continue calcium and vitamin D supplementation.  Repeat in September 2023.   6.  Cardiac disease: Patient now has 3 stents placed.  Continue follow-up and treatment per cardiology.   Patient expressed understanding and was in agreement with this plan. She also understands that She can call clinic at any time with any questions, concerns, or complaints.   Cancer Staging Malignant neoplasm of right female breast [facility]) Staging form: Breast, AJCC 8th Edition - Clinical stage from 10/16/2016: Stage IIB (cT2, cN1, cM0, G3, ER: Negative, PR: Positive, HER2: Positive) - Signed by Lloyd Huger, MD on 10/16/2016   Lloyd Huger, MD   05/05/2020 9:18 AM

## 2020-05-04 ENCOUNTER — Inpatient Hospital Stay: Payer: Medicare Other

## 2020-05-04 ENCOUNTER — Inpatient Hospital Stay (HOSPITAL_BASED_OUTPATIENT_CLINIC_OR_DEPARTMENT_OTHER): Payer: Medicare Other | Admitting: Oncology

## 2020-05-04 ENCOUNTER — Other Ambulatory Visit: Payer: Self-pay | Admitting: *Deleted

## 2020-05-04 ENCOUNTER — Inpatient Hospital Stay: Payer: Medicare Other | Attending: Oncology

## 2020-05-04 VITALS — BP 128/67 | HR 88 | Temp 98.9°F | Wt 148.6 lb

## 2020-05-04 VITALS — BP 112/65 | HR 74

## 2020-05-04 DIAGNOSIS — D509 Iron deficiency anemia, unspecified: Secondary | ICD-10-CM | POA: Diagnosis not present

## 2020-05-04 DIAGNOSIS — I2510 Atherosclerotic heart disease of native coronary artery without angina pectoris: Secondary | ICD-10-CM | POA: Diagnosis not present

## 2020-05-04 DIAGNOSIS — Z171 Estrogen receptor negative status [ER-]: Secondary | ICD-10-CM | POA: Insufficient documentation

## 2020-05-04 DIAGNOSIS — Z8616 Personal history of COVID-19: Secondary | ICD-10-CM | POA: Insufficient documentation

## 2020-05-04 DIAGNOSIS — I5022 Chronic systolic (congestive) heart failure: Secondary | ICD-10-CM | POA: Diagnosis not present

## 2020-05-04 DIAGNOSIS — J449 Chronic obstructive pulmonary disease, unspecified: Secondary | ICD-10-CM | POA: Insufficient documentation

## 2020-05-04 DIAGNOSIS — M1990 Unspecified osteoarthritis, unspecified site: Secondary | ICD-10-CM | POA: Insufficient documentation

## 2020-05-04 DIAGNOSIS — E785 Hyperlipidemia, unspecified: Secondary | ICD-10-CM | POA: Diagnosis not present

## 2020-05-04 DIAGNOSIS — C50411 Malignant neoplasm of upper-outer quadrant of right female breast: Secondary | ICD-10-CM

## 2020-05-04 DIAGNOSIS — K219 Gastro-esophageal reflux disease without esophagitis: Secondary | ICD-10-CM | POA: Diagnosis not present

## 2020-05-04 DIAGNOSIS — F419 Anxiety disorder, unspecified: Secondary | ICD-10-CM | POA: Diagnosis not present

## 2020-05-04 DIAGNOSIS — I252 Old myocardial infarction: Secondary | ICD-10-CM | POA: Diagnosis not present

## 2020-05-04 DIAGNOSIS — Z79811 Long term (current) use of aromatase inhibitors: Secondary | ICD-10-CM | POA: Insufficient documentation

## 2020-05-04 DIAGNOSIS — C50011 Malignant neoplasm of nipple and areola, right female breast: Secondary | ICD-10-CM

## 2020-05-04 DIAGNOSIS — I110 Hypertensive heart disease with heart failure: Secondary | ICD-10-CM | POA: Insufficient documentation

## 2020-05-04 LAB — CBC WITH DIFFERENTIAL/PLATELET
Abs Immature Granulocytes: 0.28 10*3/uL — ABNORMAL HIGH (ref 0.00–0.07)
Basophils Absolute: 0.1 10*3/uL (ref 0.0–0.1)
Basophils Relative: 1 %
Eosinophils Absolute: 0.3 10*3/uL (ref 0.0–0.5)
Eosinophils Relative: 2 %
HCT: 38 % (ref 36.0–46.0)
Hemoglobin: 11.5 g/dL — ABNORMAL LOW (ref 12.0–15.0)
Immature Granulocytes: 3 %
Lymphocytes Relative: 13 %
Lymphs Abs: 1.5 10*3/uL (ref 0.7–4.0)
MCH: 24.8 pg — ABNORMAL LOW (ref 26.0–34.0)
MCHC: 30.3 g/dL (ref 30.0–36.0)
MCV: 81.9 fL (ref 80.0–100.0)
Monocytes Absolute: 1.2 10*3/uL — ABNORMAL HIGH (ref 0.1–1.0)
Monocytes Relative: 11 %
Neutro Abs: 7.7 10*3/uL (ref 1.7–7.7)
Neutrophils Relative %: 70 %
Platelets: 407 10*3/uL — ABNORMAL HIGH (ref 150–400)
RBC: 4.64 MIL/uL (ref 3.87–5.11)
RDW: 18.6 % — ABNORMAL HIGH (ref 11.5–15.5)
WBC: 11.1 10*3/uL — ABNORMAL HIGH (ref 4.0–10.5)
nRBC: 0 % (ref 0.0–0.2)

## 2020-05-04 LAB — IRON AND TIBC
Iron: 47 ug/dL (ref 28–170)
Saturation Ratios: 16 % (ref 10.4–31.8)
TIBC: 288 ug/dL (ref 250–450)
UIBC: 247 ug/dL

## 2020-05-04 LAB — FERRITIN: Ferritin: 82 ng/mL (ref 11–307)

## 2020-05-04 LAB — BASIC METABOLIC PANEL
Anion gap: 10 (ref 5–15)
BUN: 26 mg/dL — ABNORMAL HIGH (ref 8–23)
CO2: 22 mmol/L (ref 22–32)
Calcium: 8.5 mg/dL — ABNORMAL LOW (ref 8.9–10.3)
Chloride: 103 mmol/L (ref 98–111)
Creatinine, Ser: 1.39 mg/dL — ABNORMAL HIGH (ref 0.44–1.00)
GFR, Estimated: 39 mL/min — ABNORMAL LOW (ref >60)
Glucose, Bld: 152 mg/dL — ABNORMAL HIGH (ref 70–99)
Potassium: 4.5 mmol/L (ref 3.5–5.1)
Sodium: 135 mmol/L (ref 135–145)

## 2020-05-04 MED ORDER — SODIUM CHLORIDE 0.9 % IV SOLN
Freq: Once | INTRAVENOUS | Status: AC
  Filled 2020-05-04: qty 250

## 2020-05-04 MED ORDER — IRON SUCROSE 20 MG/ML IV SOLN
200.0000 mg | Freq: Once | INTRAVENOUS | Status: AC
  Administered 2020-05-04: 200 mg via INTRAVENOUS
  Filled 2020-05-04: qty 10

## 2020-05-04 MED ORDER — HEPARIN SOD (PORK) LOCK FLUSH 100 UNIT/ML IV SOLN
INTRAVENOUS | Status: AC
  Filled 2020-05-04: qty 5

## 2020-05-04 MED ORDER — SODIUM CHLORIDE 0.9% FLUSH
10.0000 mL | INTRAVENOUS | Status: DC | PRN
  Administered 2020-05-04: 10 mL via INTRAVENOUS
  Filled 2020-05-04: qty 10

## 2020-05-04 MED ORDER — ACETAMINOPHEN 325 MG PO TABS
650.0000 mg | ORAL_TABLET | Freq: Once | ORAL | Status: AC
  Administered 2020-05-04: 650 mg via ORAL
  Filled 2020-05-04: qty 2

## 2020-05-04 MED ORDER — HEPARIN SOD (PORK) LOCK FLUSH 100 UNIT/ML IV SOLN
500.0000 [IU] | Freq: Once | INTRAVENOUS | Status: AC
  Administered 2020-05-04: 500 [IU] via INTRAVENOUS
  Filled 2020-05-04: qty 5

## 2020-05-04 NOTE — Progress Notes (Signed)
cbc

## 2020-05-04 NOTE — Progress Notes (Signed)
Pt tolerated Venofer infusion well with no complications. VSS. Pt stable for discharge.   Krista Cisneros [employer]

## 2020-05-04 NOTE — Progress Notes (Signed)
Patient here for follow up. She reports that she had COVD during Christmas and has been extremely tired and lacks energy.

## 2020-05-06 ENCOUNTER — Ambulatory Visit: Payer: Medicare Other | Admitting: Radiation Oncology

## 2020-05-30 ENCOUNTER — Other Ambulatory Visit: Payer: Self-pay | Admitting: Cardiovascular Disease

## 2020-06-01 ENCOUNTER — Encounter: Payer: Self-pay | Admitting: Radiation Oncology

## 2020-06-01 ENCOUNTER — Telehealth: Payer: Self-pay | Admitting: Cardiovascular Disease

## 2020-06-01 NOTE — Telephone Encounter (Signed)
Left message for patient requesting a return call. According to her chart, Enalapril was discontinued in February 2021.

## 2020-06-01 NOTE — Telephone Encounter (Signed)
*  STAT* If patient is at the pharmacy, call can be transferred to refill team.   1. Which medications need to be refilled? (please list name of each medication and dose if known)    enalapril maleate 5 mg po q d   2. Which pharmacy/location (including street and city if local pharmacy) is medication to be sent to?    [facility] [address] [place]    3. Do they need a 30 day or 90 day supply? Krista Cisneros

## 2020-06-02 ENCOUNTER — Ambulatory Visit
Admission: RE | Admit: 2020-06-02 | Discharge: 2020-06-02 | Disposition: A | Discharge status: Home / Self Care | Payer: Medicare Other | Source: Ambulatory Visit | Attending: Radiation Oncology | Admitting: Radiation Oncology

## 2020-06-02 ENCOUNTER — Encounter: Payer: Self-pay | Admitting: Radiation Oncology

## 2020-06-02 DIAGNOSIS — Z86000 Personal history of in-situ neoplasm of breast: Secondary | ICD-10-CM | POA: Insufficient documentation

## 2020-06-02 DIAGNOSIS — Z923 Personal history of irradiation: Secondary | ICD-10-CM | POA: Insufficient documentation

## 2020-06-02 DIAGNOSIS — Z171 Estrogen receptor negative status [ER-]: Secondary | ICD-10-CM

## 2020-06-02 MED ORDER — ENALAPRIL MALEATE 5 MG PO TABS: 5.0000 mg | ORAL_TABLET | Freq: Every day | ORAL | 3 refills | Status: DC

## 2020-06-02 NOTE — Addendum Note (Signed)
Addended by: Wynema Birch on: 06/02/2020 02:21 PM   Modules accepted: Orders

## 2020-06-02 NOTE — Telephone Encounter (Signed)
Patient calling back stating she was unaware that the medication had been discontinued and would like to discuss why. Patient states she has been taking this the last year and would like to know why she was not made aware to stop or of a new medication to replace this one  Please advise

## 2020-06-02 NOTE — Progress Notes (Signed)
Radiation Oncology Follow up Note  Name: Krista Cisneros   Date:   06/02/2020 MRN:  [id number] DOB: 1941/02/06    This 80 y.o. female presents to the clinic today for 2-1/2-year follow-up status post whole breast radiation to her right breast for stage IIb ER negative PR positive HER-2/neu positive invasive mammary carcinoma.  REFERRING PROVIDER: Sharyne Peach, MD  HPI: Patient is a 80 year old female now out two and half years having completed whole breast radiation to her right breast for stage IIb HER-2/neu positive invasive mammary carcinoma seen today in routine follow-up she is doing well. She specifically denies breast tenderness cough or bone pain.. On her last mammogram back in September she had some a 3 mm group of calcifications which may reflect dystrophic calcification she is having a follow-up mammogram at 6 months out from that scan. She is currently on letrozole tolerating it well without side effect.  COMPLICATIONS OF TREATMENT: none  FOLLOW UP COMPLIANCE: keeps appointments   PHYSICAL EXAM:  BP (!) (P) 139/92 (BP Location: Left Arm, Patient Position: Sitting)   Pulse (P) 89   Temp (!) (P) 97 F (36.1 C) (Tympanic)   Resp (P) 16   Wt (P) 153 lb 8 oz (69.6 kg)   BMI (P) 28.08 kg/m  Lungs are clear to A&P cardiac examination essentially unremarkable with regular rate and rhythm. No dominant mass or nodularity is noted in either breast in 2 positions examined. Incision is well-healed. No axillary or supraclavicular adenopathy is appreciated. Cosmetic result is excellent. Well-developed well-nourished patient in NAD. HEENT reveals PERLA, EOMI, discs not visualized.  Oral cavity is clear. No oral mucosal lesions are identified. Neck is clear without evidence of cervical or supraclavicular adenopathy. Lungs are clear to A&P. Cardiac examination is essentially unremarkable with regular rate and rhythm without murmur rub or thrill. Abdomen is benign with no organomegaly or  masses noted. Motor sensory and DTR levels are equal and symmetric in the upper and lower extremities. Cranial nerves II through XII are grossly intact. Proprioception is intact. No peripheral adenopathy or edema is identified. No motor or sensory levels are noted. Crude visual fields are within normal range.  RADIOLOGY RESULTS: Mammogram and ultrasound reviewed compatible with above-stated findings  PLAN: Present time patient continues to do well with no evidence of disease. She will have follow-up mammograms to track the new 3 mm group of calcifications in her right breast. I have asked to see her back in 1 year for follow-up. Patient knows to call with any concerns.  I would like to take this opportunity to thank you for allowing me to participate in the care of your patient.Noreene Filbert, MD

## 2020-06-02 NOTE — Telephone Encounter (Signed)
Pt returned call, reports has been taking Enalapril 5 mg daily for "a while now". Last dose was this morning, has not missed any dosing as well. Pt reports still has several more days left of the medication. Apologize to pt that we are unsure how this medication has fallen off our list, but since she has been taking it daily and there is no documentation or indication to d/c this medication, Vivi Ferns, NP is okay with refilling the Enalapril and adding back to her current medication list. Pt is grateful her medication is being taken care of. Nothing further at this time, [facility] [place].

## 2020-06-02 NOTE — Telephone Encounter (Signed)
Attempted to call pt, no answer. LMTCB. Did leave detail message (DPR approved) that Enalpril is not on her medication list, looks like Dr. Rockey Situ and Laurann Montana, NP did not remove this medication per office visit notes. Last note on 12/01/19, this medication was not on the list, but [place] referred to you taking Enalpril 5 mg daily. Last seen Dr. Rockey Situ in Dec and this mediation was not on med list nor was it mention in notes. Advised that it could have been her PCP or one of her [facility] doctors who could have removed mediation. Will need pt to call back to see when she last took medication.  Secured chatted Laurann Montana, NP regarding mediation and this is what she recommended:  Most recent labs 04/2020 show stable renal function. I would just call her and confirm she has been taking it. Another provider like her PCP may have been refilling it. If she has been taking, okay to send Rx at present dose with refills. If she has not been taking it, please ask her to keep a BP log and call us back in 2 weeks with readings. Then we can determine if she needs to resume"  Will await pt's call back regarding medication.

## 2020-06-11 ENCOUNTER — Other Ambulatory Visit: Payer: Self-pay | Admitting: Cardiovascular Disease

## 2020-06-11 DIAGNOSIS — I2510 Atherosclerotic heart disease of native coronary artery without angina pectoris: Secondary | ICD-10-CM

## 2020-06-17 ENCOUNTER — Other Ambulatory Visit: Payer: Self-pay

## 2020-06-17 ENCOUNTER — Inpatient Hospital Stay
Admission: EM | Admit: 2020-06-17 | Discharge: 2020-06-20 | DRG: 871 | Disposition: A | Discharge status: Home Health | Payer: Medicare Other | Attending: Internal Medicine | Admitting: Internal Medicine

## 2020-06-17 ENCOUNTER — Emergency Department: Payer: Medicare Other

## 2020-06-17 DIAGNOSIS — Z880 Allergy status to penicillin: Secondary | ICD-10-CM

## 2020-06-17 DIAGNOSIS — J440 Chronic obstructive pulmonary disease with acute lower respiratory infection: Secondary | ICD-10-CM | POA: Diagnosis present

## 2020-06-17 DIAGNOSIS — Z853 Personal history of malignant neoplasm of breast: Secondary | ICD-10-CM | POA: Diagnosis not present

## 2020-06-17 DIAGNOSIS — Z8616 Personal history of COVID-19: Secondary | ICD-10-CM | POA: Diagnosis not present

## 2020-06-17 DIAGNOSIS — Z888 Allergy status to other drugs, medicaments and biological substances status: Secondary | ICD-10-CM

## 2020-06-17 DIAGNOSIS — A419 Sepsis, unspecified organism: Secondary | ICD-10-CM | POA: Diagnosis not present

## 2020-06-17 DIAGNOSIS — J189 Pneumonia, unspecified organism: Secondary | ICD-10-CM

## 2020-06-17 DIAGNOSIS — Z7902 Long term (current) use of antithrombotics/antiplatelets: Secondary | ICD-10-CM

## 2020-06-17 DIAGNOSIS — J159 Unspecified bacterial pneumonia: Secondary | ICD-10-CM | POA: Diagnosis present

## 2020-06-17 DIAGNOSIS — I252 Old myocardial infarction: Secondary | ICD-10-CM

## 2020-06-17 DIAGNOSIS — I130 Hypertensive heart and chronic kidney disease with heart failure and stage 1 through stage 4 chronic kidney disease, or unspecified chronic kidney disease: Secondary | ICD-10-CM | POA: Diagnosis present

## 2020-06-17 DIAGNOSIS — Z79899 Other long term (current) drug therapy: Secondary | ICD-10-CM

## 2020-06-17 DIAGNOSIS — Z800 Family history of malignant neoplasm of digestive organs: Secondary | ICD-10-CM

## 2020-06-17 DIAGNOSIS — Z955 Presence of coronary angioplasty implant and graft: Secondary | ICD-10-CM

## 2020-06-17 DIAGNOSIS — E785 Hyperlipidemia, unspecified: Secondary | ICD-10-CM | POA: Diagnosis present

## 2020-06-17 DIAGNOSIS — I5032 Chronic diastolic (congestive) heart failure: Secondary | ICD-10-CM

## 2020-06-17 DIAGNOSIS — I10 Essential (primary) hypertension: Secondary | ICD-10-CM | POA: Diagnosis present

## 2020-06-17 DIAGNOSIS — K219 Gastro-esophageal reflux disease without esophagitis: Secondary | ICD-10-CM | POA: Diagnosis present

## 2020-06-17 DIAGNOSIS — Z79811 Long term (current) use of aromatase inhibitors: Secondary | ICD-10-CM | POA: Diagnosis not present

## 2020-06-17 DIAGNOSIS — Z7982 Long term (current) use of aspirin: Secondary | ICD-10-CM | POA: Diagnosis not present

## 2020-06-17 DIAGNOSIS — R531 Weakness: Secondary | ICD-10-CM

## 2020-06-17 DIAGNOSIS — N1831 Chronic kidney disease, stage 3a: Secondary | ICD-10-CM

## 2020-06-17 DIAGNOSIS — Z886 Allergy status to analgesic agent status: Secondary | ICD-10-CM

## 2020-06-17 DIAGNOSIS — M1990 Unspecified osteoarthritis, unspecified site: Secondary | ICD-10-CM | POA: Diagnosis present

## 2020-06-17 DIAGNOSIS — J449 Chronic obstructive pulmonary disease, unspecified: Secondary | ICD-10-CM | POA: Diagnosis present

## 2020-06-17 DIAGNOSIS — Z20822 Contact with and (suspected) exposure to covid-19: Secondary | ICD-10-CM | POA: Diagnosis present

## 2020-06-17 DIAGNOSIS — Z9221 Personal history of antineoplastic chemotherapy: Secondary | ICD-10-CM

## 2020-06-17 DIAGNOSIS — G629 Polyneuropathy, unspecified: Secondary | ICD-10-CM | POA: Diagnosis present

## 2020-06-17 DIAGNOSIS — I2510 Atherosclerotic heart disease of native coronary artery without angina pectoris: Secondary | ICD-10-CM | POA: Diagnosis present

## 2020-06-17 DIAGNOSIS — Z87891 Personal history of nicotine dependence: Secondary | ICD-10-CM

## 2020-06-17 DIAGNOSIS — I255 Ischemic cardiomyopathy: Secondary | ICD-10-CM | POA: Diagnosis present

## 2020-06-17 DIAGNOSIS — Z923 Personal history of irradiation: Secondary | ICD-10-CM | POA: Diagnosis not present

## 2020-06-17 LAB — CBC WITH DIFFERENTIAL/PLATELET
Abs Immature Granulocytes: 0.06 10*3/uL (ref 0.00–0.07)
Basophils Absolute: 0.1 10*3/uL (ref 0.0–0.1)
Basophils Relative: 1 %
Eosinophils Absolute: 0.2 10*3/uL (ref 0.0–0.5)
Eosinophils Relative: 1 %
HCT: 43.1 % (ref 36.0–46.0)
Hemoglobin: 13.7 g/dL (ref 12.0–15.0)
Immature Granulocytes: 1 %
Lymphocytes Relative: 11 %
Lymphs Abs: 1.4 10*3/uL (ref 0.7–4.0)
MCH: 27.6 pg (ref 26.0–34.0)
MCHC: 31.8 g/dL (ref 30.0–36.0)
MCV: 86.7 fL (ref 80.0–100.0)
Monocytes Absolute: 1.6 10*3/uL — ABNORMAL HIGH (ref 0.1–1.0)
Monocytes Relative: 13 %
Neutro Abs: 9.2 10*3/uL — ABNORMAL HIGH (ref 1.7–7.7)
Neutrophils Relative %: 73 %
Platelets: 254 10*3/uL (ref 150–400)
RBC: 4.97 MIL/uL (ref 3.87–5.11)
RDW: 21.4 % — ABNORMAL HIGH (ref 11.5–15.5)
Smear Review: NORMAL
WBC: 12.5 10*3/uL — ABNORMAL HIGH (ref 4.0–10.5)
nRBC: 0 % (ref 0.0–0.2)

## 2020-06-17 LAB — COMPREHENSIVE METABOLIC PANEL
ALT: 10 U/L (ref 0–44)
AST: 22 U/L (ref 15–41)
Albumin: 3.2 g/dL — ABNORMAL LOW (ref 3.5–5.0)
Alkaline Phosphatase: 46 U/L (ref 38–126)
Anion gap: 10 (ref 5–15)
BUN: 17 mg/dL (ref 8–23)
CO2: 21 mmol/L — ABNORMAL LOW (ref 22–32)
Calcium: 8.6 mg/dL — ABNORMAL LOW (ref 8.9–10.3)
Chloride: 104 mmol/L (ref 98–111)
Creatinine, Ser: 1.21 mg/dL — ABNORMAL HIGH (ref 0.44–1.00)
GFR, Estimated: 46 mL/min — ABNORMAL LOW (ref >60)
Glucose, Bld: 102 mg/dL — ABNORMAL HIGH (ref 70–99)
Potassium: 4.4 mmol/L (ref 3.5–5.1)
Sodium: 135 mmol/L (ref 135–145)
Total Bilirubin: 1.3 mg/dL — ABNORMAL HIGH (ref 0.3–1.2)
Total Protein: 6.9 g/dL (ref 6.5–8.1)

## 2020-06-17 LAB — URINALYSIS, COMPLETE (UACMP) WITH MICROSCOPIC
Bacteria, UA: NONE SEEN
Bilirubin Urine: NEGATIVE
Glucose, UA: NEGATIVE mg/dL
Hgb urine dipstick: NEGATIVE
Ketones, ur: 5 mg/dL — AB
Leukocytes,Ua: NEGATIVE
Nitrite: NEGATIVE
Protein, ur: NEGATIVE mg/dL
Specific Gravity, Urine: 1.029 (ref 1.005–1.030)
Squamous Epithelial / HPF: NONE SEEN (ref 0–5)
pH: 5 (ref 5.0–8.0)

## 2020-06-17 LAB — RESP PANEL BY RT-PCR (FLU A&B, COVID) ARPGX2
Influenza A by PCR: NEGATIVE
Influenza B by PCR: NEGATIVE
SARS Coronavirus 2 by RT PCR: NEGATIVE

## 2020-06-17 LAB — LACTIC ACID, PLASMA: Lactic Acid, Venous: 1.5 mmol/L (ref 0.5–1.9)

## 2020-06-17 LAB — TSH: TSH: 3.566 u[IU]/mL (ref 0.350–4.500)

## 2020-06-17 LAB — PROCALCITONIN: Procalcitonin: 0.22 ng/mL

## 2020-06-17 IMAGING — DX DG CHEST 1V
1 series · 1 of 1 positions shown · non-contrast
Comparison: [DATE] and prior.

CLINICAL DATA: Weakness

EXAM:
CHEST  1 VIEW

[chest ap]
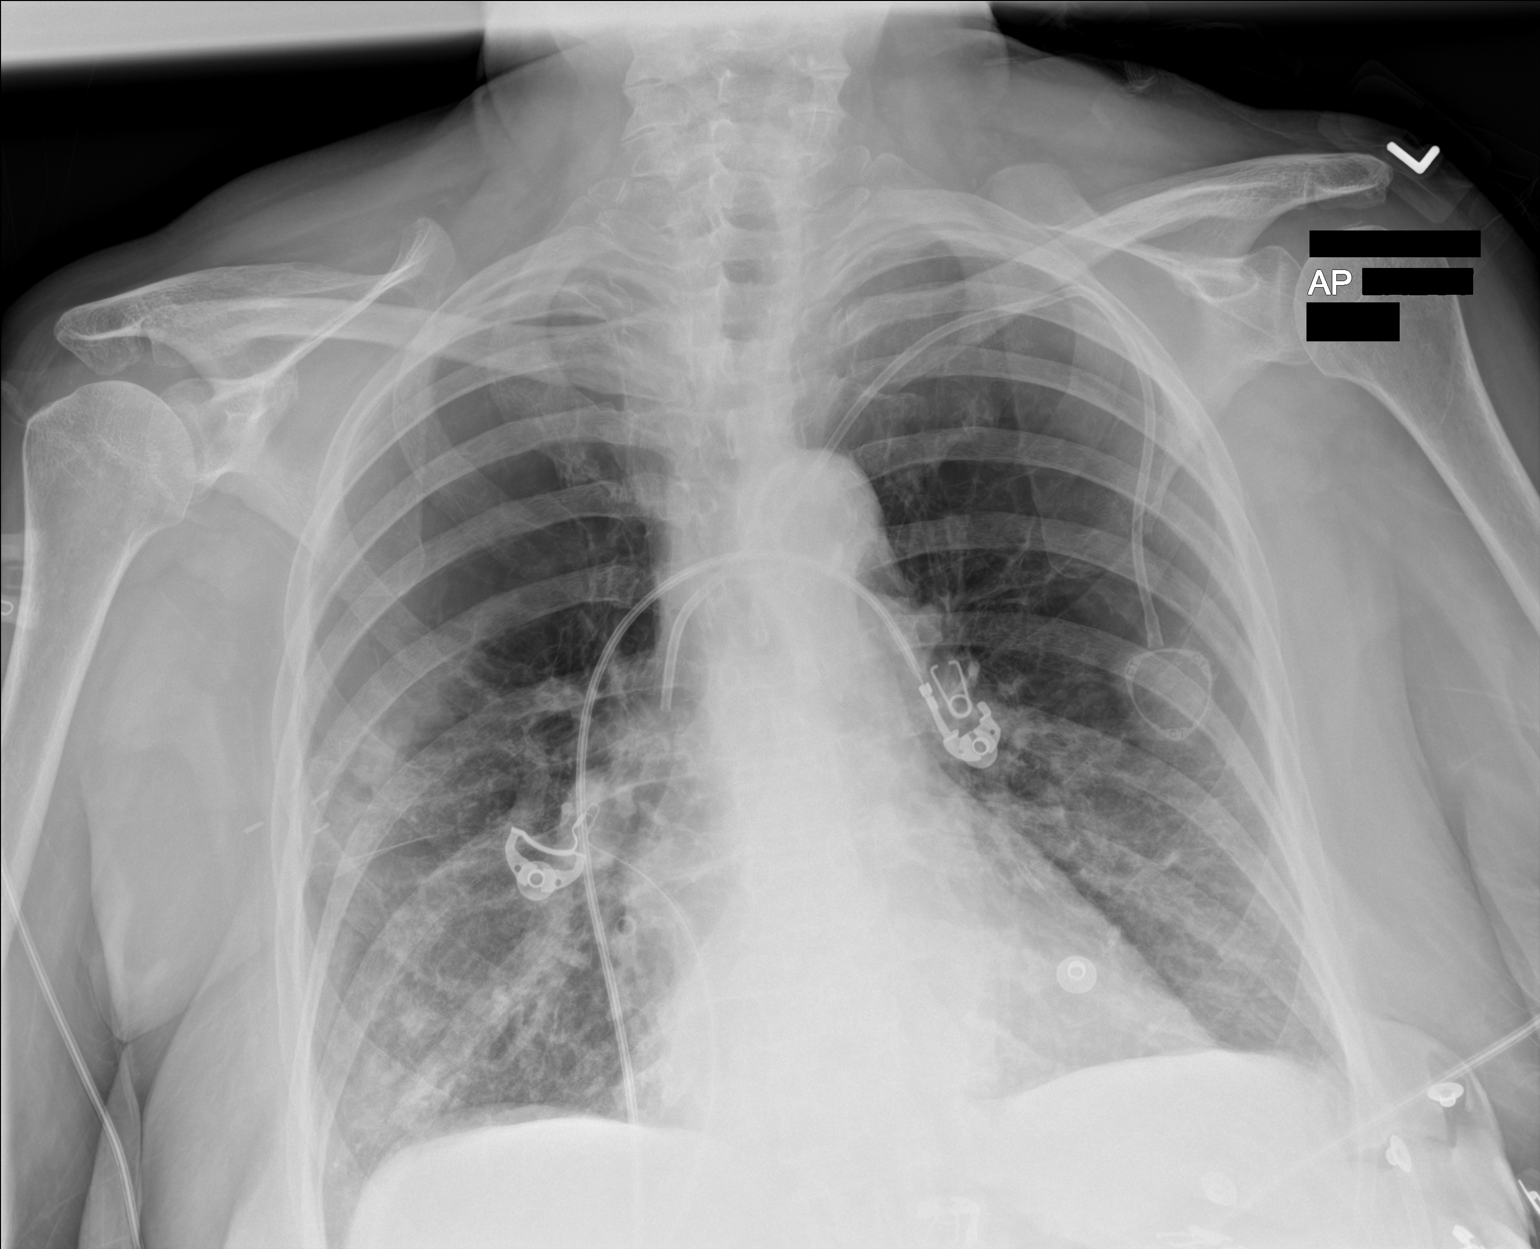

[1 of 1 positions shown; findings below may reference images not displayed]

FINDINGS: Left chest wall Port-A-Cath tip overlies the distal SVC. Right
thoracic surgical clips. No pneumothorax or pleural effusion.

Emphysematous changes with stable crowding of the lung base
markings. New nodular right mid to lower lung opacities.
Cardiomediastinal silhouette within normal limits. No acute osseous
abnormality.
IMPRESSION: New right lung nodular opacities. Differential includes
infectious/inflammatory foci however cannot exclude alternative
pathologies.

Consider chest CT for better characterisation.

## 2020-06-17 IMAGING — CT CT ANGIO CHEST
2 of 6 series · 18 of 46 positions shown · IV contrast (APPLIED)
Comparison: [DATE], [DATE]

CLINICAL DATA: Shortness of breath, generalized weakness, history
of right breast cancer

EXAM:
CT ANGIOGRAPHY CHEST WITH CONTRAST
TECHNIQUE: Multidetector CT imaging of the chest was performed using the
standard protocol during bolus administration of intravenous
contrast. Multiplanar CT image reconstructions and MIPs were
obtained to evaluate the vascular anatomy.
CONTRAST:  75mL OMNIPAQUE IOHEXOL 350 MG/ML SOLN

[Series 6: thins · axial · 0.62mm/px · z∈[-364,-95]mm · 15 of 295 slices shown]
[im 13/295  lung]
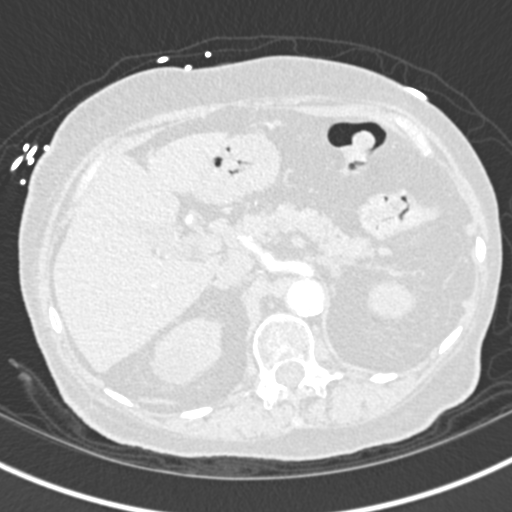
[im 39/295  soft-tissue]
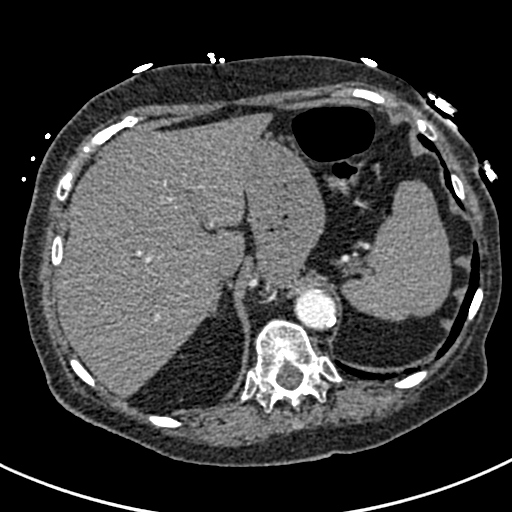
[im 52/295  lung]
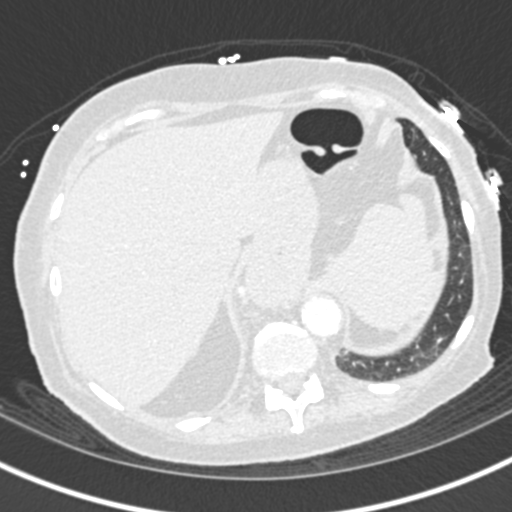
[im 77/295  soft-tissue]
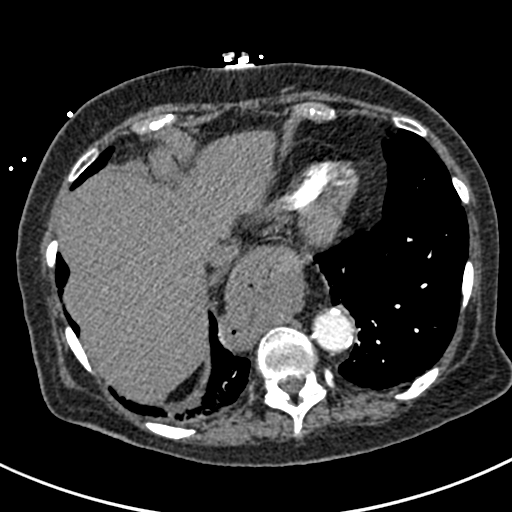
[im 90/295  lung]
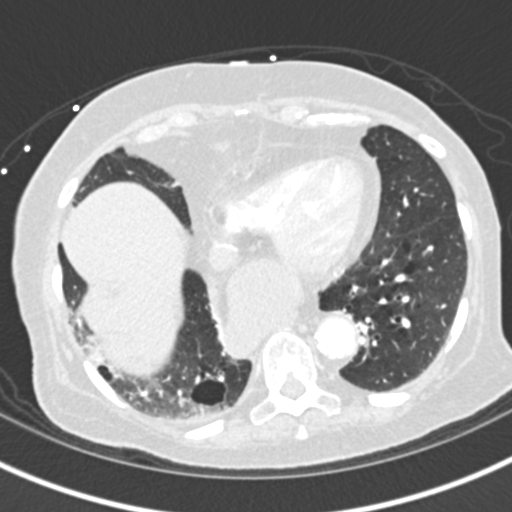
[im 116/295  soft-tissue]
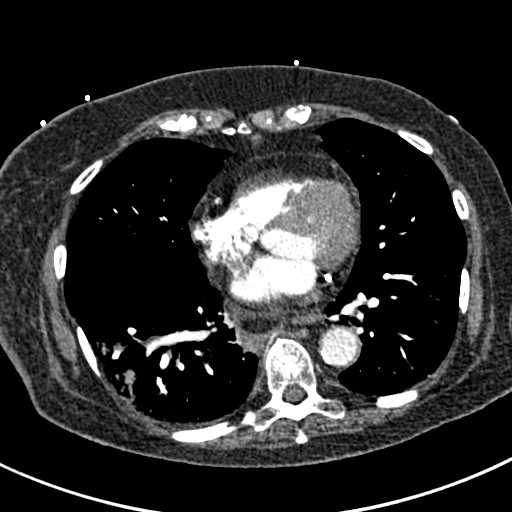
[im 128/295  lung]
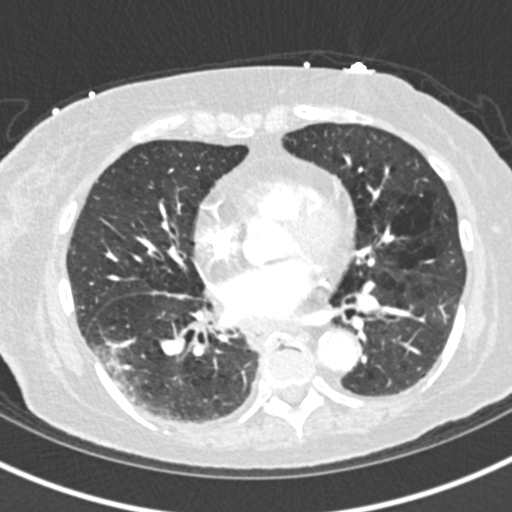
[im 154/295  soft-tissue]
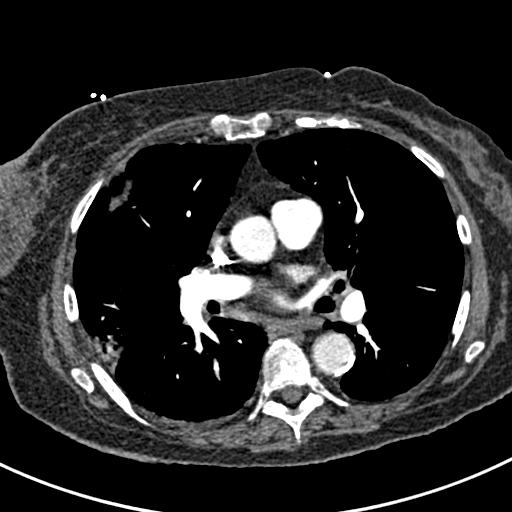
[im 167/295  lung]
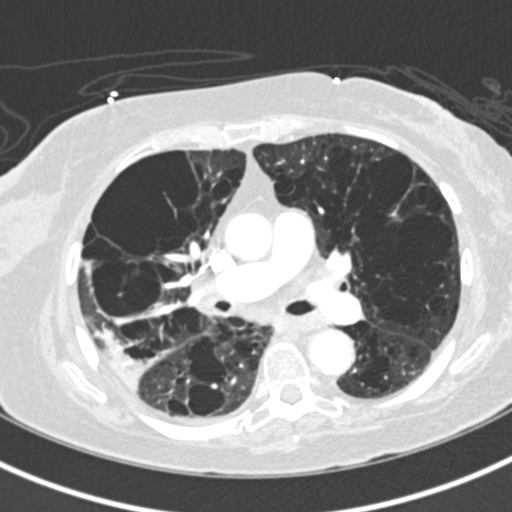
[im 179/295  soft-tissue]
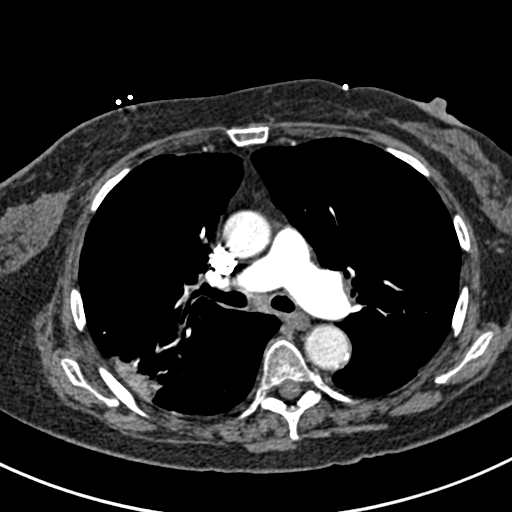
[im 205/295  lung]
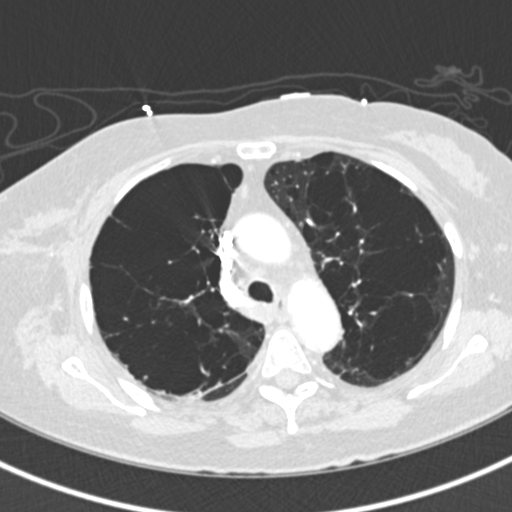
[im 218/295  soft-tissue]
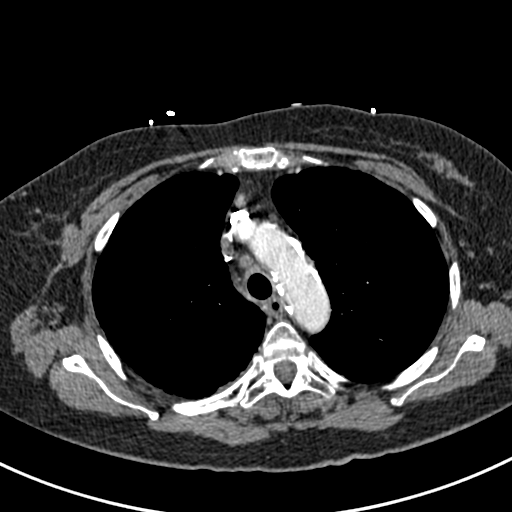
[im 243/295  lung]
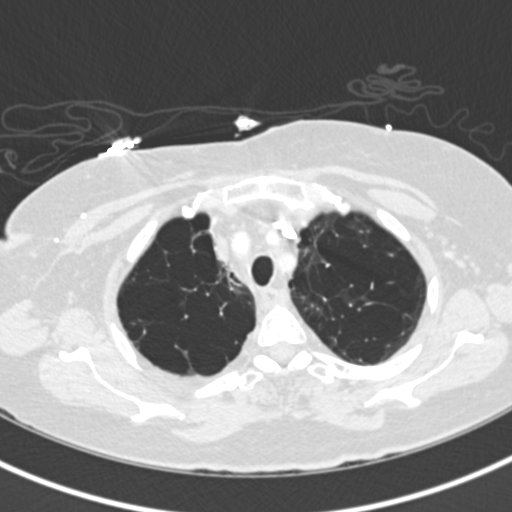
[im 256/295  soft-tissue]
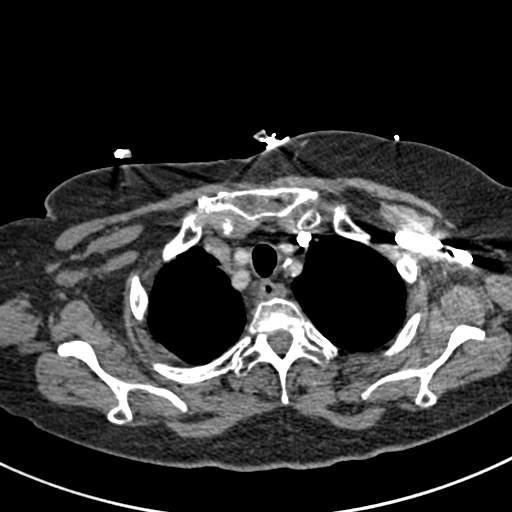
[im 282/295  lung]
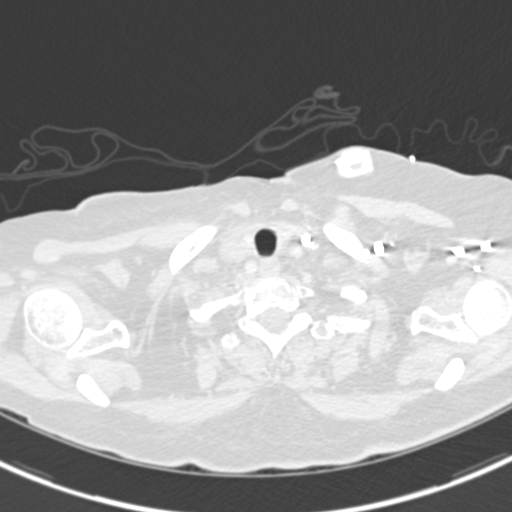

[Series 7: coronal mpr · coronal · 0.63mm/px · 3 of 85 slices shown]
[im 22/85  soft-tissue]
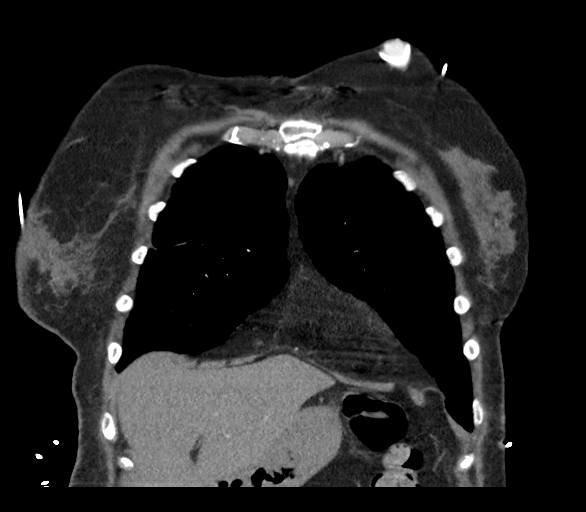
[im 43/85  soft-tissue]
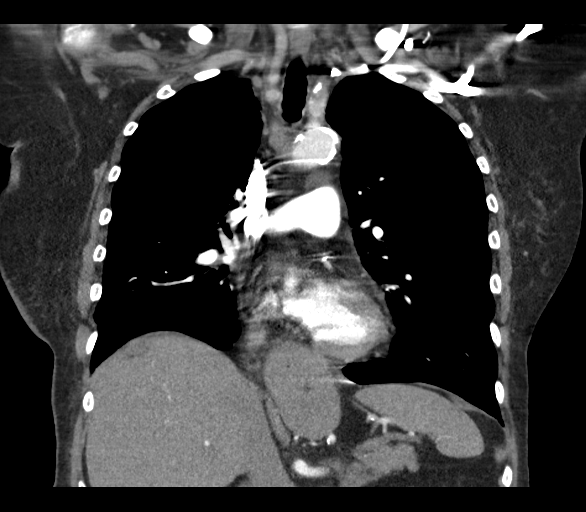
[im 64/85  soft-tissue]
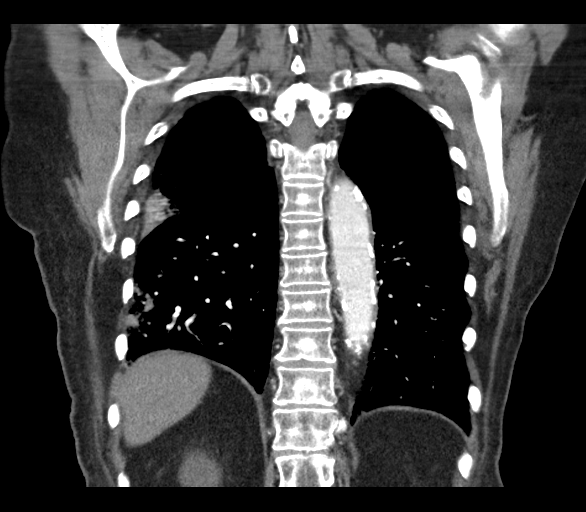

[18 of 46 positions shown; findings below may reference images not displayed]

FINDINGS: Cardiovascular: This is a technically adequate evaluation of the
pulmonary vasculature. No filling defects or pulmonary emboli.

The heart is unremarkable without pericardial effusion. Dense
atherosclerosis throughout the coronary vasculature.

Normal caliber of the thoracic aorta. Moderate atherosclerosis of
the aortic arch and descending thoracic aorta.

Mediastinum/Nodes: Borderline enlarged pretracheal lymph node
measuring 10 mm image [DATE], previously having measured 6 mm. No
other pathologic adenopathy. Thyroid gland, trachea, and esophagus
demonstrate no significant findings. Moderate hiatal hernia.

Lungs/Pleura: Severe centrilobular emphysema, upper lobe
predominant. Focal areas of consolidation are seen within the right
upper and right lower lobes, which could reflect bronchopneumonia.
No effusion or pneumothorax. Central airways are patent.

Upper Abdomen: No acute abnormality.

Musculoskeletal: No acute or destructive bony lesions. Postsurgical
changes are seen within the right breast and axilla. Reconstructed
images demonstrate no additional findings.

Review of the MIP images confirms the above findings.
IMPRESSION: 1. Patchy consolidation within the right upper and right lower lobe,
consistent with bronchopneumonia. Followup PA and lateral chest
X-ray is recommended in 3-4 weeks following trial of antibiotic
therapy to ensure resolution and exclude underlying malignancy.
2. Borderline enlarged pretracheal lymph node, which could be
reactive. Continued follow-up may be warranted given patient's
history of breast cancer.
3. Severe upper lobe predominant centrilobular emphysema.
4. Moderate hiatal hernia.
5. Aortic Atherosclerosis ([H6]-[H6]). Coronary artery
atherosclerosis.

## 2020-06-17 MED ORDER — LEVOFLOXACIN IN D5W 750 MG/150ML IV SOLN
750.0000 mg | INTRAVENOUS | Status: DC
  Administered 2020-06-19: 750 mg via INTRAVENOUS
  Filled 2020-06-17: qty 150

## 2020-06-17 MED ORDER — SODIUM CHLORIDE 0.9 % IV SOLN: 500.0000 mg | INTRAVENOUS | Status: DC

## 2020-06-17 MED ORDER — IOHEXOL 350 MG/ML SOLN
75.0000 mL | Freq: Once | INTRAVENOUS | Status: AC | PRN
  Administered 2020-06-17: 75 mL via INTRAVENOUS

## 2020-06-17 MED ORDER — SODIUM CHLORIDE 0.9 % IV SOLN: INTRAVENOUS | Status: DC

## 2020-06-17 MED ORDER — SODIUM CHLORIDE 0.9 % IV SOLN: 2.0000 g | INTRAVENOUS | Status: DC

## 2020-06-17 MED ORDER — LEVOFLOXACIN IN D5W 750 MG/150ML IV SOLN
750.0000 mg | Freq: Once | INTRAVENOUS | Status: AC
  Administered 2020-06-17: 750 mg via INTRAVENOUS
  Filled 2020-06-17: qty 150

## 2020-06-17 MED ORDER — ENOXAPARIN SODIUM 40 MG/0.4ML ~~LOC~~ SOLN
40.0000 mg | SUBCUTANEOUS | Status: DC
  Administered 2020-06-17 – 2020-06-18 (×2): 40 mg via SUBCUTANEOUS
  Filled 2020-06-17 (×3): qty 0.4

## 2020-06-17 NOTE — ED Provider Notes (Signed)
[facility] Emergency Department Provider Note  ____________________________________________   Event Date/Time   First MD Initiated Contact with Patient 06/17/20 1903     (approximate)  I have reviewed the triage vital signs and the nursing notes.   HISTORY  Chief Complaint Weakness   HPI Krista Cisneros is a 80 y.o. female with a past medical history of CAD, CHF, COPD, anxiety, arthritis, GERD, HTN, HDL and Covid in December 2021 who presents for assessment approximately 2 days of worsening general fatigue and weakness.  Patient states she has not been out of her bed all day when she tried to she slid down to the floor did not hit her head and has no pain anywhere from this.  She states she has had very poor appetite and last couple of days.  She denies any headache, earache, sore throat, chest pain, cough, shortness of breath, dental pain, nausea, vomiting, diarrhea, dysuria, rash or any urinary symptoms.  No focal extremity weakness numbness or tingling.  No clear alleviating aggravating factors.         Past Medical History:  Diagnosis Date  . Anxiety   . Arthritis   . CAD (coronary artery disease)    a. NSTEMI 12/19; b. LHC 04/10/18: pLAD 95%, mLAD 80%, m-dLCx 95%, OM3-1 lesion 40%, OM3-2 lesion 60%, mRCA 50%, EF 35-45%, successful PCI/DES x 2 to the LAD with recommended staged PCI of the LCx in a few weeks  . Cancer [facility])    Right Breast Cancer  . Chronic systolic CHF (congestive heart failure) ([place])    a. TTE 12/19: EF 30-35%, anteroseptal, anterior, and apical HK, Gr1DD, mild to mod MR, mildly dilated LA, RVSF nl  . COPD (chronic obstructive pulmonary disease) ([place])   . Depression   . Dyspnea    with exertion  . GERD (gastroesophageal reflux disease)   . Hyperlipidemia   . Hypertension   . Personal history of chemotherapy 2018   chemo prior to lumpectomy of right breast  . Personal history of radiation therapy 2019   right breast ca     Patient Active Problem List   Diagnosis Date Noted  . Sepsis due to Escherichia coli (E. coli) ([place]) 10/10/2019  . Acute lower UTI 10/10/2019  . SIRS (systemic inflammatory response syndrome) ([place]) 10/07/2019  . Constipation 10/07/2019  . Urinary retention 10/07/2019  . Iron deficiency anemia 05/30/2019  . Ischemic cardiomyopathy 06/03/2018  . Coronary artery disease involving native coronary artery of native heart with angina pectoris ([place]) 05/14/2018  . Chronic systolic heart failure ([place])   . Coronary artery disease involving native coronary artery of native heart without angina pectoris 04/22/2018  . Non-STEMI (non-ST elevated myocardial infarction) ([place]) 04/22/2018  . NSTEMI (non-ST elevated myocardial infarction) ([place]) 04/09/2018  . Rectal bleeding 03/22/2018  . Malignant neoplasm of upper-outer quadrant of right breast in female, estrogen receptor negative ([place]) 06/19/2017  . Incarcerated hernia 03/26/2017  . Incarcerated inguinal hernia   . Acute kidney injury ([place]) 02/07/2017  . Hypomagnesemia 01/30/2017  . Goals of care, counseling/discussion 10/20/2016  . Malignant neoplasm of right female breast ([name]) 10/16/2016  . Hyperlipidemia, mixed 10/08/2016  . Chronic venous insufficiency 09/14/2016  . GERD (gastroesophageal reflux disease) 09/14/2016  . Pain in limb 05/29/2016  . Essential hypertension 05/29/2016  . COPD (chronic obstructive pulmonary disease) ([place]) 05/29/2016  . Hardening of the aorta (main artery of the heart) ([place]) 01/07/2016  . Hyperglycemia, unspecified 01/07/2016  . Neuropathy 11/15/2015  . Severe  recurrent major depression without psychotic features ([place]) 09/10/2015  . Allergic rhinitis 04/10/2014  . Depression 04/10/2014  . Obesity, unspecified 04/10/2014    Past Surgical History:  Procedure Laterality Date  . ABDOMINAL HYSTERECTOMY  1990   Partial  . BREAST BIOPSY Right 10/04/2016   axilla lymph node (metastatic carcinoma) and axillay tail  mass biopsy-invasive mammary carcinoma  . BREAST LUMPECTOMY Right 03/21/2017   chemo first, [facility] and metastatic LN  . BREAST LUMPECTOMY Right 05/21/2017   re-excision for clip  . BREAST LUMPECTOMY WITH NEEDLE LOCALIZATION Right 03/21/2017   Procedure: BREAST LUMPECTOMY WITH NEEDLE LOCALIZATION;  Surgeon: Clayburn Pert, MD;  Location: [facility] ORS;  Service: General;  Laterality: Right;  . CORONARY STENT INTERVENTION N/A 04/10/2018   Procedure: CORONARY STENT INTERVENTION;  Surgeon: Wellington Hampshire, MD;  Location: [place] [facility] LAB;  Service: Cardiovascular;  Laterality: N/A;  . CORONARY STENT INTERVENTION N/A 05/14/2018   Procedure: CORONARY STENT INTERVENTION;  Surgeon: Nelva Bush, MD;  Location: [place] [facility];  Service: Cardiovascular;  Laterality: N/A;  . DILATION AND CURETTAGE OF UTERUS    . INGUINAL HERNIA REPAIR Right 03/26/2017   Procedure: HERNIA REPAIR INGUINAL INCARCERATED;  Surgeon: Jules Husbands, MD;  Location: [facility] ORS;  Service: General;  Laterality: Right;  . LEFT HEART CATH AND CORONARY ANGIOGRAPHY N/A 04/10/2018   Procedure: LEFT HEART CATH AND CORONARY ANGIOGRAPHY poss PCI;  Surgeon: Minna Merritts, MD;  Location: [place] [facility];  Service: Cardiovascular;  Laterality: N/A;  . PORTACATH PLACEMENT Left 10/24/2016   Procedure: INSERTION PORT-A-CATH;  Surgeon: [name], MD;  Location: [facility] ORS;  Service: General;  Laterality: Left;  . RE-EXCISION OF BREAST LUMPECTOMY Right 05/21/2017   Procedure: RE-EXCISION OF BREAST LUMPECTOMY;  Surgeon: Clayburn Pert, MD;  Location: [facility] ORS;  Service: General;  Laterality: Right;  . SENTINEL NODE BIOPSY Right 03/21/2017   Procedure: SENTINEL NODE BIOPSY;  Surgeon: Clayburn Pert, MD;  Location: [facility] ORS;  Service: General;  Laterality: Right;    Prior to Admission medications   Medication Sig Start Date End Date Taking? Authorizing Provider  ADVAIR DISKUS 500-50 MCG/DOSE AEPB Inhale 1 puff into the lungs 2  (two) times daily.  05/04/16   [provider]  albuterol (PROVENTIL HFA;VENTOLIN HFA) 108 (90 Base) MCG/ACT inhaler Inhale 2 puffs into the lungs every 6 (six) hours as needed for wheezing or shortness of breath.    [provider]  aspirin EC 81 MG tablet Take 81 mg by mouth daily.    [provider]  baclofen (LIORESAL) 10 MG tablet Take 10 mg by mouth at bedtime. 01/14/17   [provider]  Calcium-Vitamin D-Vitamin K 258-527-78 MG-UNT-MCG TABS Take 1 tablet by mouth daily.    [provider]  carvedilol (COREG) 3.125 MG tablet Take 2 tablets (6.25 mg total) by mouth 2 (two) times daily. 10/10/19 11/30/28  Guilford Shi, MD  cholecalciferol (VITAMIN D3) 25 MCG (1000 UT) tablet Take 1,000 Units by mouth daily.    [provider]  clopidogrel (PLAVIX) 75 MG tablet Take 1 tablet (75 mg total) by mouth daily with breakfast. 08/07/19   Minna Merritts, MD  Coenzyme Q10 300 MG CAPS Take 300 mg by mouth daily.     [provider]  DULoxetine (CYMBALTA) 30 MG capsule Take 30 mg by mouth every morning.  05/04/17   [provider]  DULoxetine (CYMBALTA) 60 MG capsule Take 60 mg by mouth daily.    [provider]  enalapril (VASOTEC) 5 MG tablet Take 1 tablet (5 mg total) by mouth daily. 06/02/20   Loel Dubonnet, NP  fluticasone (FLONASE) 50 MCG/ACT nasal spray Place 2 sprays into the nose daily. 05/23/19 06/01/20  [provider]  furosemide (LASIX) 20 MG tablet Take 1 tablet (20 mg total) by mouth as needed for edema. 04/22/18 10/20/28  Rise Mu, PA-C  KLOR-CON M20 20 MEQ tablet TAKE 1 TABLET BY MOUTH EVERY DAY 11/18/19   Verlon Au, NP  letrozole [facility]) 2.5 MG tablet TAKE 1 TABLET BY MOUTH EVERY DAY 03/22/20   Verlon Au, NP  lidocaine-prilocaine (EMLA) cream Apply 1 application as needed topically (for port access).    [provider]  magnesium oxide (MAG-OX) 400 MG tablet Take 400 mg by  mouth daily.    [provider]  Melatonin 5 MG TABS Take 10 mg by mouth at bedtime as needed (sleep).    [provider]  Melatonin-Pyridoxine 5-1 MG TABS Take 1 tablet by mouth daily.    [provider]  montelukast (SINGULAIR) 10 MG tablet Take 10 mg by mouth at bedtime.  05/23/16   [provider]  nitroGLYCERIN (NITROSTAT) 0.4 MG SL tablet Place 1 tablet (0.4 mg total) under the tongue every 5 (five) minutes as needed for chest pain. 09/03/19 12/02/19  Loel Dubonnet, NP  pantoprazole (PROTONIX) 40 MG tablet TAKE 1 TABLET BY MOUTH EVERY DAY 06/11/20   Minna Merritts, MD  rosuvastatin (CRESTOR) 20 MG tablet TAKE 1 TABLET BY MOUTH EVERY DAY AT 6PM 06/11/20   Minna Merritts, MD  spironolactone (ALDACTONE) 25 MG tablet Take 0.5 tablets (12.5 mg total) by mouth daily. 12/15/19   Minna Merritts, MD  prochlorperazine (COMPAZINE) 10 MG tablet Take 1 tablet (10 mg total) by mouth every 6 (six) hours as needed (Nausea or vomiting). 10/20/16 07/31/17  Lloyd Huger, MD    Allergies Nsaids, Penicillins, Tamsulosin, Atorvastatin, Gabapentin, Ezetimibe, and Aspirin  Family History  Problem Relation Age of Onset  . Colon cancer Mother   . Breast cancer Neg Hx     Social History Social History   Tobacco Use  . Smoking status: Former Smoker    Packs/day: 0.50    Types: Cigarettes    Quit date: 07/23/1988    Years since quitting: 31.9  . Smokeless tobacco: Never Used  Vaping Use  . Vaping Use: Never used  Substance Use Topics  . Alcohol use: Yes    Comment: rare  . Drug use: No    Review of Systems  Review of Systems  Constitutional: Positive for malaise/fatigue. Negative for chills and fever.  HENT: Negative for sore throat.   Eyes: Negative for pain.  Respiratory: Negative for cough and stridor.   Cardiovascular: Negative for chest pain.  Gastrointestinal: Negative for vomiting.  Genitourinary: Negative for dysuria.  Musculoskeletal:  Negative for myalgias.  Skin: Negative for rash.  Neurological: Positive for weakness. Negative for seizures, loss of consciousness and headaches.  Psychiatric/Behavioral: Negative for suicidal ideas.  All other systems reviewed and are negative.     ____________________________________________   PHYSICAL EXAM:  VITAL SIGNS: ED Triage Vitals [06/17/20 1902]  Enc Vitals Group     BP      Pulse      Resp      Temp      Temp src      SpO2 93 %     Weight  Height      Head Circumference      Peak Flow      Pain Score      Pain Loc      Pain Edu?      Excl. in [place]?    Vitals:   06/17/20 1902 06/17/20 1910  BP:  135/76  Pulse:  95  Resp:  (!) 23  Temp:  100.3 F (37.9 C)  SpO2: 93% 93%   Physical Exam Vitals and nursing note reviewed.  Constitutional:      General: She is not in acute distress.    Appearance: She is well-developed and well-nourished.  HENT:     Head: Normocephalic and atraumatic.     Right Ear: External ear normal.     Left Ear: External ear normal.     Nose: Nose normal.     Mouth/Throat:     Mouth: Mucous membranes are dry.  Eyes:     Conjunctiva/sclera: Conjunctivae normal.  Cardiovascular:     Rate and Rhythm: Normal rate and regular rhythm.     Heart sounds: No murmur heard.   Pulmonary:     Effort: Pulmonary effort is normal. No respiratory distress.     Breath sounds: Normal breath sounds.  Abdominal:     Palpations: Abdomen is soft.     Tenderness: There is no abdominal tenderness.  Musculoskeletal:        General: No edema.     Cervical back: Neck supple.  Skin:    General: Skin is warm and dry.     Capillary Refill: Capillary refill takes less than 2 seconds.  Neurological:     Mental Status: She is alert and oriented to person, place, and time.  Psychiatric:        Mood and Affect: Mood and affect and mood normal.     Cranial nerves II through XII grossly intact.  No pronator drift.  No finger dysmetria.  Symmetric  5/5 strength of all extremities.  Sensation intact to light touch in all extremities.  Unremarkable unassisted gait.  ____________________________________________   LABS (all labs ordered are listed, but only abnormal results are displayed)  Labs Reviewed  CBC WITH DIFFERENTIAL/PLATELET - Abnormal; Notable for the following components:      Result Value   WBC 12.5 (*)    RDW 21.4 (*)    Neutro Abs 9.2 (*)    Monocytes Absolute 1.6 (*)    All other components within normal limits  COMPREHENSIVE METABOLIC PANEL - Abnormal; Notable for the following components:   CO2 21 (*)    Glucose, Bld 102 (*)    Creatinine, Ser 1.21 (*)    Calcium 8.6 (*)    Albumin 3.2 (*)    Total Bilirubin 1.3 (*)    GFR, Estimated 46 (*)    All other components within normal limits  RESP PANEL BY RT-PCR (FLU A&B, COVID) ARPGX2  URINE CULTURE  CULTURE, BLOOD (ROUTINE X 2)  CULTURE, BLOOD (ROUTINE X 2)  TSH  LACTIC ACID, PLASMA  PROCALCITONIN  URINALYSIS, COMPLETE (UACMP) WITH MICROSCOPIC  LACTIC ACID, PLASMA   ____________________________________________  EKG  Sinus rhythm with ventricular rate of 95, normal axis, nonspecific ST changes with artifact in V5 without other clear evidence of acute ischemia or other significant underlying arrhythmia. ____________________________________________  RADIOLOGY  ED MD interpretation: Chest x-ray has some opacities in the right base which radiology did recommend following up with CT.  No evidence of large effusion, overt edema or  other clear acute thoracic process.  CTA does show evidence of likely right-sided pneumonia.  There is also evidence of a moderate hiatal hernia, aortic atherosclerosis, CAD, and some peritracheal lymphadenopathy.   Official radiology report(s): DG Chest 1 View  Result Date: 06/17/2020 CLINICAL DATA:  Weakness EXAM: CHEST  1 VIEW COMPARISON:  10/07/2019 and prior. FINDINGS: Left chest wall Port-A-Cath tip overlies the distal SVC.  Right thoracic surgical clips. No pneumothorax or pleural effusion. Emphysematous changes with stable crowding of the lung base markings. New nodular right mid to lower lung opacities. Cardiomediastinal silhouette within normal limits. No acute osseous abnormality. IMPRESSION: New right lung nodular opacities. Differential includes infectious/inflammatory foci however cannot exclude alternative pathologies. Consider chest CT for better characterisation. Electronically Signed   By: Primitivo Gauze M.D.   On: 06/17/2020 19:51   CT Angio Chest PE W and/or Wo Contrast  Result Date: 06/17/2020 CLINICAL DATA:  Shortness of breath, generalized weakness, history of right breast cancer EXAM: CT ANGIOGRAPHY CHEST WITH CONTRAST TECHNIQUE: Multidetector CT imaging of the chest was performed using the standard protocol during bolus administration of intravenous contrast. Multiplanar CT image reconstructions and MIPs were obtained to evaluate the vascular anatomy. CONTRAST:  70mL OMNIPAQUE IOHEXOL 350 MG/ML SOLN COMPARISON:  06/17/2020, 04/09/2018 FINDINGS: Cardiovascular: This is a technically adequate evaluation of the pulmonary vasculature. No filling defects or pulmonary emboli. The heart is unremarkable without pericardial effusion. Dense atherosclerosis throughout the coronary vasculature. Normal caliber of the thoracic aorta. Moderate atherosclerosis of the aortic arch and descending thoracic aorta. Mediastinum/Nodes: Borderline enlarged pretracheal lymph node measuring 10 mm image 25/4, previously having measured 6 mm. No other pathologic adenopathy. Thyroid gland, trachea, and esophagus demonstrate no significant findings. Moderate hiatal hernia. Lungs/Pleura: Severe centrilobular emphysema, upper lobe predominant. Focal areas of consolidation are seen within the right upper and right lower lobes, which could reflect bronchopneumonia. No effusion or pneumothorax. Central airways are patent. Upper Abdomen: No  acute abnormality. Musculoskeletal: No acute or destructive bony lesions. Postsurgical changes are seen within the right breast and axilla. Reconstructed images demonstrate no additional findings. Review of the MIP images confirms the above findings. IMPRESSION: 1. Patchy consolidation within the right upper and right lower lobe, consistent with bronchopneumonia. Followup PA and lateral chest X-ray is recommended in 3-4 weeks following trial of antibiotic therapy to ensure resolution and exclude underlying malignancy. 2. Borderline enlarged pretracheal lymph node, which could be reactive. Continued follow-up may be warranted given patient's history of breast cancer. 3. Severe upper lobe predominant centrilobular emphysema. 4. Moderate hiatal hernia. 5. Aortic Atherosclerosis (ICD10-I70.0). Coronary artery atherosclerosis. Electronically Signed   By: Randa Ngo M.D.   On: 06/17/2020 20:49    ____________________________________________   PROCEDURES  Procedure(s) performed (including Critical Care):  .1-3 Lead EKG Interpretation Performed by: Lucrezia Starch, MD Authorized by: Lucrezia Starch, MD     Interpretation: normal     ECG rate assessment: normal     Rhythm: sinus rhythm     Ectopy: none     Conduction: normal       ____________________________________________   INITIAL IMPRESSION / ASSESSMENT AND PLAN / ED COURSE      Patient presents with above-stated exam for assessment of generalized weakness decreased appetite and fatigue over the last 2 days.  On arrival she is tachypneic and has an elevated temperature 100.3 with otherwise stable vital signs on room air although she is borderline hypoxic at 93%.  Differential includes but is not limited to acute infectious  process, polypharmacy, metabolic derangements, dehydration secondary to decreased p.o. intake from unclear etiology, arrhythmia, symptomatic anemia and thyroid derangements.  While she does states she slid off her  bed earlier today no obvious evidence on exam of any significant trauma.  She is a nonfocal neuro exam and overall very low suspicion for CVA.  Patient denies any chest pain and given to the reassuring EKG and a very low suspicion for ACS at this time.  ECG does have some opacities in the right base which radiology did recommend following up with CTA.  No evidence of PE on CTA but patient does have evidence of right lower lobe pneumonia.  There is also evidence of CAD, aortic atherosclerosis and a hiatal hernia.  CBC with leukocytosis with WBC count of 12.5.  Hemoglobin is 13.7 and a persistently symptomatic anemia.  CMP shows no significant electrolyte or metabolic derangements.  Procalcitonin is slightly elevated at 0.22.  TSH is WNL.  Lactic acid is 1.5.  Overall low suspicion for sepsis.  However given concern for kidney acquired pneumonia and significant weakness as well as tachypnea and borderline hypoxia blood cultures were obtained and patient was given IV antibiotics.  Covid is negative.  Overall low suspicion for significant metabolic derangement, acute thyroid dysfunction or other immediate life-threatening process I suspect patient symptoms are likely related to infectious pneumonia.  I'll plan to admit to medicine for further evaluation management.     ____________________________________________   FINAL CLINICAL IMPRESSION(S) / ED DIAGNOSES  Final diagnoses:  Community acquired pneumonia of right lung, unspecified part of lung    Medications  levofloxacin (LEVAQUIN) IVPB 750 mg (has no administration in time range)  iohexol (OMNIPAQUE) 350 MG/ML injection 75 mL (75 mLs Intravenous Contrast Given 06/17/20 2023)     ED Discharge Orders    None       Note:  This document was prepared using Dragon voice recognition software and may include unintentional dictation errors.   Lucrezia Starch, MD 06/17/20 2105

## 2020-06-17 NOTE — H&P (Addendum)
History and Physical    Krista Cisneros QMG:[id number] DOB: Dec 28, 1940 DOA: 06/17/2020  PCP: Sharyne Peach, MD   Patient coming from: Home  I have personally briefly reviewed patient's old medical records in [place]  Chief Complaint: Weakness, cough  HPI: Krista Cisneros is a 80 y.o. female with medical history significant for COPD, CAD with history of PCI to LCx and LAD, diastolic heart failure last EF 50 to 55% June 2021, previously EF in the 10s with ischemic cardiomyopathy, as well as history of breast cancer, CKD 3a, HTN and history of Covid December 2021 from which she had completely recovered, who presents with a 2-week history of a cough now with right-sided pleuritic chest pain on coughing and a 1 day history of fever.  It is associated with generalized weakness to where she has been staying in bed, due to difficulty standing up.  When trying to get out of bed on the day of arrival but slid to the floor, not sustaining any injury.  She has  mild shortness of breath but no worse than her baseline for her COPD.  States that her daughter was recently diagnosed with pneumonia..  Denies nausea, vomiting or abdominal pain. ED course: On arrival, febrile at 100.3, BP 135/76, pulse 95, O2 sat 93% on room air.  Blood work significant for leukocytosis of 12,500 but with normal lactic acid of 1.5.  Procalcitonin 0.22.  Creatinine 1.21 which is her baseline. EKG as interpreted by me: Sinus rhythm at 95 with nonspecific ST-T wave changes Imaging: Chest x-ray showed new right lung nodule opacities CTA chest: Patchy consolidation right upper and right lower lobe consistent with bronchopneumonia with recommendation for follow-up PA and lateral chest in 3 to 4 weeks to ensure resolution  Patient started on Rocephin.  Hospitalist consulted for admission.   Review of Systems: As per HPI otherwise all other systems on review of systems negative.    Past Medical History:  Diagnosis  Date  . Anxiety   . Arthritis   . CAD (coronary artery disease)    a. NSTEMI 12/19; b. LHC 04/10/18: pLAD 95%, mLAD 80%, m-dLCx 95%, OM3-1 lesion 40%, OM3-2 lesion 60%, mRCA 50%, EF 35-45%, successful PCI/DES x 2 to the LAD with recommended staged PCI of the LCx in a few weeks  . Cancer [facility])    Right Breast Cancer  . Chronic systolic CHF (congestive heart failure) ([place])    a. TTE 12/19: EF 30-35%, anteroseptal, anterior, and apical HK, Gr1DD, mild to mod MR, mildly dilated LA, RVSF nl  . COPD (chronic obstructive pulmonary disease) ([place])   . Depression   . Dyspnea    with exertion  . GERD (gastroesophageal reflux disease)   . Hyperlipidemia   . Hypertension   . Personal history of chemotherapy 2018   chemo prior to lumpectomy of right breast  . Personal history of radiation therapy 2019   right breast ca    Past Surgical History:  Procedure Laterality Date  . ABDOMINAL HYSTERECTOMY  1990   Partial  . BREAST BIOPSY Right 10/04/2016   axilla lymph node (metastatic carcinoma) and axillay tail mass biopsy-invasive mammary carcinoma  . BREAST LUMPECTOMY Right 03/21/2017   chemo first, [facility] and metastatic LN  . BREAST LUMPECTOMY Right 05/21/2017   re-excision for clip  . BREAST LUMPECTOMY WITH NEEDLE LOCALIZATION Right 03/21/2017   Procedure: BREAST LUMPECTOMY WITH NEEDLE LOCALIZATION;  Surgeon: Clayburn Pert, MD;  Location: [facility] ORS;  Service: General;  Laterality:  Right;  Marland Kitchen CORONARY STENT INTERVENTION N/A 04/10/2018   Procedure: CORONARY STENT INTERVENTION;  Surgeon: Wellington Hampshire, MD;  Location: [place] CV LAB;  Service: Cardiovascular;  Laterality: N/A;  . CORONARY STENT INTERVENTION N/A 05/14/2018   Procedure: CORONARY STENT INTERVENTION;  Surgeon: Nelva Bush, MD;  Location: [place] CV LAB;  Service: Cardiovascular;  Laterality: N/A;  . DILATION AND CURETTAGE OF UTERUS    . INGUINAL HERNIA REPAIR Right 03/26/2017   Procedure: HERNIA REPAIR INGUINAL  INCARCERATED;  Surgeon: Jules Husbands, MD;  Location: [facility] ORS;  Service: General;  Laterality: Right;  . LEFT HEART CATH AND CORONARY ANGIOGRAPHY N/A 04/10/2018   Procedure: LEFT HEART CATH AND CORONARY ANGIOGRAPHY poss PCI;  Surgeon: Minna Merritts, MD;  Location: [place] CV LAB;  Service: Cardiovascular;  Laterality: N/A;  . PORTACATH PLACEMENT Left 10/24/2016   Procedure: INSERTION PORT-A-CATH;  Surgeon: [name], MD;  Location: [facility] ORS;  Service: General;  Laterality: Left;  . RE-EXCISION OF BREAST LUMPECTOMY Right 05/21/2017   Procedure: RE-EXCISION OF BREAST LUMPECTOMY;  Surgeon: Clayburn Pert, MD;  Location: [facility] ORS;  Service: General;  Laterality: Right;  . SENTINEL NODE BIOPSY Right 03/21/2017   Procedure: SENTINEL NODE BIOPSY;  Surgeon: Clayburn Pert, MD;  Location: [facility] ORS;  Service: General;  Laterality: Right;     reports that she quit smoking about 31 years ago. Her smoking use included cigarettes. She smoked 0.50 packs per day. She has never used smokeless tobacco. She reports current alcohol use. She reports that she does not use drugs.  Allergies  Allergen Reactions  . Nsaids     Bleeding risk  . Penicillins Anaphylaxis, Swelling and Rash    Has patient had a PCN reaction causing immediate rash, facial/tongue/throat swelling, SOB or lightheadedness with hypotension: Yes Has patient had a PCN reaction causing severe rash involving mucus membranes or skin necrosis: No Has patient had a PCN reaction that required hospitalization: No  Has patient had a PCN reaction occurring within the last 10 years: No If all of the above answers are "NO", then may proceed with Cephalosporin use.   . Tamsulosin Nausea Only  . Atorvastatin Other (See Comments)    Muscle Pain  . Gabapentin Swelling  . Ezetimibe Other (See Comments)    Muscle pain  . Aspirin     GI bleeding risk    Family History  Problem Relation Age of Onset  . Colon cancer Mother   . Breast  cancer Neg Hx       Prior to Admission medications   Medication Sig Start Date End Date Taking? Authorizing Provider  ADVAIR DISKUS 500-50 MCG/DOSE AEPB Inhale 1 puff into the lungs 2 (two) times daily.  05/04/16   [provider]  albuterol (PROVENTIL HFA;VENTOLIN HFA) 108 (90 Base) MCG/ACT inhaler Inhale 2 puffs into the lungs every 6 (six) hours as needed for wheezing or shortness of breath.    [provider]  aspirin EC 81 MG tablet Take 81 mg by mouth daily.    [provider]  baclofen (LIORESAL) 10 MG tablet Take 10 mg by mouth at bedtime. 01/14/17   [provider]  Calcium-Vitamin D-Vitamin K 270-623-76 MG-UNT-MCG TABS Take 1 tablet by mouth daily.    [provider]  carvedilol (COREG) 3.125 MG tablet Take 2 tablets (6.25 mg total) by mouth 2 (two) times daily. 10/10/19 11/30/28  Guilford Shi, MD  cholecalciferol (VITAMIN D3) 25 MCG (1000 UT) tablet Take 1,000 Units by  mouth daily.    [provider]  clopidogrel (PLAVIX) 75 MG tablet Take 1 tablet (75 mg total) by mouth daily with breakfast. 08/07/19   Minna Merritts, MD  Coenzyme Q10 300 MG CAPS Take 300 mg by mouth daily.     [provider]  DULoxetine (CYMBALTA) 30 MG capsule Take 30 mg by mouth every morning.  05/04/17   [provider]  DULoxetine (CYMBALTA) 60 MG capsule Take 60 mg by mouth daily.    [provider]  enalapril (VASOTEC) 5 MG tablet Take 1 tablet (5 mg total) by mouth daily. 06/02/20   Loel Dubonnet, NP  fluticasone (FLONASE) 50 MCG/ACT nasal spray Place 2 sprays into the nose daily. 05/23/19 06/01/20  [provider]  furosemide (LASIX) 20 MG tablet Take 1 tablet (20 mg total) by mouth as needed for edema. 04/22/18 10/20/28  Rise Mu, PA-C  KLOR-CON M20 20 MEQ tablet TAKE 1 TABLET BY MOUTH EVERY DAY 11/18/19   Verlon Au, NP  letrozole [facility]) 2.5 MG tablet TAKE 1 TABLET BY MOUTH EVERY DAY 03/22/20   Verlon Au, NP  lidocaine-prilocaine (EMLA) cream Apply 1 application as needed topically (for port access).    [provider]  magnesium oxide (MAG-OX) 400 MG tablet Take 400 mg by mouth daily.    [provider]  Melatonin 5 MG TABS Take 10 mg by mouth at bedtime as needed (sleep).    [provider]  Melatonin-Pyridoxine 5-1 MG TABS Take 1 tablet by mouth daily.    [provider]  montelukast (SINGULAIR) 10 MG tablet Take 10 mg by mouth at bedtime.  05/23/16   [provider]  nitroGLYCERIN (NITROSTAT) 0.4 MG SL tablet Place 1 tablet (0.4 mg total) under the tongue every 5 (five) minutes as needed for chest pain. 09/03/19 12/02/19  Loel Dubonnet, NP  pantoprazole (PROTONIX) 40 MG tablet TAKE 1 TABLET BY MOUTH EVERY DAY 06/11/20   Minna Merritts, MD  rosuvastatin (CRESTOR) 20 MG tablet TAKE 1 TABLET BY MOUTH EVERY DAY AT 6PM 06/11/20   Minna Merritts, MD  spironolactone (ALDACTONE) 25 MG tablet Take 0.5 tablets (12.5 mg total) by mouth daily. 12/15/19   Minna Merritts, MD  prochlorperazine (COMPAZINE) 10 MG tablet Take 1 tablet (10 mg total) by mouth every 6 (six) hours as needed (Nausea or vomiting). 10/20/16 07/31/17  Lloyd Huger, MD    Physical Exam: Vitals:   06/17/20 1902 06/17/20 1905 06/17/20 1910  BP:   135/76  Pulse:   95  Resp:   (!) 23  Temp:   100.3 F (37.9 C)  TempSrc:   Oral  SpO2: 93%  93%  Weight:  63.5 kg   Height:  5\' 2"  (1.575 m)      Vitals:   06/17/20 1902 06/17/20 1905 06/17/20 1910  BP:   135/76  Pulse:   95  Resp:   (!) 23  Temp:   100.3 F (37.9 C)  TempSrc:   Oral  SpO2: 93%  93%  Weight:  63.5 kg   Height:  5\' 2"  (1.575 m)       Constitutional: Frail-appearing elderly female and oriented x 3 .  Conversational dyspnea  HEENT:      Head: Normocephalic and atraumatic.         Eyes: PERLA, EOMI, mild ptosis right eyelid, conjunctivae are normal. Sclera is non-icteric.       Mouth/Throat: Mucous  membranes are moist.       Neck: Supple with no signs of meningismus. Cardiovascular: Regular rate and rhythm. No murmurs, gallops, or rubs. 2+ symmetrical distal pulses are present . No JVD. No LE edema Respiratory: Respiratory effort increased.Lungs sounds diminished on right.  Crackles on right Gastrointestinal: Soft, non tender, and non distended with positive bowel sounds.  Genitourinary: No CVA tenderness. Musculoskeletal: Nontender with normal range of motion in all extremities. No cyanosis, or erythema of extremities. Neurologic:  Face is symmetric. Moving all extremities. No gross focal neurologic deficits . Skin: Skin is warm, dry.  No rash or ulcers Psychiatric: Mood and affect are normal    Labs on Admission: I have personally reviewed following labs and imaging studies  CBC: Recent Labs  Lab 06/17/20 1916  WBC 12.5*  NEUTROABS 9.2*  HGB 13.7  HCT 43.1  MCV 86.7  PLT 628   Basic Metabolic Panel: Recent Labs  Lab 06/17/20 1916  NA 135  K 4.4  CL 104  CO2 21*  GLUCOSE 102*  BUN 17  CREATININE 1.21*  CALCIUM 8.6*   GFR: Estimated Creatinine Clearance: 33 mL/min (A) (by C-G formula based on SCr of 1.21 mg/dL (H)). Liver Function Tests: Recent Labs  Lab 06/17/20 1916  AST 22  ALT 10  ALKPHOS 46  BILITOT 1.3*  PROT 6.9  ALBUMIN 3.2*   No results for input(s): LIPASE, AMYLASE in the last 168 hours. No results for input(s): AMMONIA in the last 168 hours. Coagulation Profile: No results for input(s): INR, PROTIME in the last 168 hours. Cardiac Enzymes: No results for input(s): CKTOTAL, CKMB, CKMBINDEX, TROPONINI in the last 168 hours. BNP (last 3 results) No results for input(s): PROBNP in the last 8760 hours. HbA1C: No results for input(s): HGBA1C in the last 72 hours. CBG: No results for input(s): GLUCAP in the last 168 hours. Lipid Profile: No results for input(s): CHOL, HDL, LDLCALC, TRIG, CHOLHDL, LDLDIRECT in the last 72 hours. Thyroid  Function Tests: Recent Labs    06/17/20 1916  TSH 3.566   Anemia Panel: No results for input(s): VITAMINB12, FOLATE, FERRITIN, TIBC, IRON, RETICCTPCT in the last 72 hours. Urine analysis:    Component Value Date/Time   COLORURINE YELLOW (A) 10/08/2019 0208   APPEARANCEUR CLOUDY (A) 10/08/2019 0208   LABSPEC 1.019 10/08/2019 0208   PHURINE 5.0 10/08/2019 0208   GLUCOSEU NEGATIVE 10/08/2019 0208   HGBUR MODERATE (A) 10/08/2019 0208   BILIRUBINUR NEGATIVE 10/08/2019 0208   KETONESUR NEGATIVE 10/08/2019 0208   PROTEINUR NEGATIVE 10/08/2019 0208   NITRITE POSITIVE (A) 10/08/2019 0208   LEUKOCYTESUR LARGE (A) 10/08/2019 0208    Radiological Exams on Admission: DG Chest 1 View  Result Date: 06/17/2020 CLINICAL DATA:  Weakness EXAM: CHEST  1 VIEW COMPARISON:  10/07/2019 and prior. FINDINGS: Left chest wall Port-A-Cath tip overlies the distal SVC. Right thoracic surgical clips. No pneumothorax or pleural effusion. Emphysematous changes with stable crowding of the lung base markings. New nodular right mid to lower lung opacities. Cardiomediastinal silhouette within normal limits. No acute osseous abnormality. IMPRESSION: New right lung nodular opacities. Differential includes infectious/inflammatory foci however cannot exclude alternative pathologies. Consider chest CT for better characterisation. Electronically Signed   By: Primitivo Gauze M.D.   On: 06/17/2020 19:51   CT Angio Chest PE W and/or Wo Contrast  Result Date: 06/17/2020 CLINICAL DATA:  Shortness of breath, generalized weakness, history of right breast cancer EXAM: CT ANGIOGRAPHY CHEST WITH CONTRAST TECHNIQUE: Multidetector CT imaging of the chest  was performed using the standard protocol during bolus administration of intravenous contrast. Multiplanar CT image reconstructions and MIPs were obtained to evaluate the vascular anatomy. CONTRAST:  69mL OMNIPAQUE IOHEXOL 350 MG/ML SOLN COMPARISON:  06/17/2020, 04/09/2018 FINDINGS:  Cardiovascular: This is a technically adequate evaluation of the pulmonary vasculature. No filling defects or pulmonary emboli. The heart is unremarkable without pericardial effusion. Dense atherosclerosis throughout the coronary vasculature. Normal caliber of the thoracic aorta. Moderate atherosclerosis of the aortic arch and descending thoracic aorta. Mediastinum/Nodes: Borderline enlarged pretracheal lymph node measuring 10 mm image 25/4, previously having measured 6 mm. No other pathologic adenopathy. Thyroid gland, trachea, and esophagus demonstrate no significant findings. Moderate hiatal hernia. Lungs/Pleura: Severe centrilobular emphysema, upper lobe predominant. Focal areas of consolidation are seen within the right upper and right lower lobes, which could reflect bronchopneumonia. No effusion or pneumothorax. Central airways are patent. Upper Abdomen: No acute abnormality. Musculoskeletal: No acute or destructive bony lesions. Postsurgical changes are seen within the right breast and axilla. Reconstructed images demonstrate no additional findings. Review of the MIP images confirms the above findings. IMPRESSION: 1. Patchy consolidation within the right upper and right lower lobe, consistent with bronchopneumonia. Followup PA and lateral chest X-ray is recommended in 3-4 weeks following trial of antibiotic therapy to ensure resolution and exclude underlying malignancy. 2. Borderline enlarged pretracheal lymph node, which could be reactive. Continued follow-up may be warranted given patient's history of breast cancer. 3. Severe upper lobe predominant centrilobular emphysema. 4. Moderate hiatal hernia. 5. Aortic Atherosclerosis (ICD10-I70.0). Coronary artery atherosclerosis. Electronically Signed   By: Randa Ngo M.D.   On: 06/17/2020 20:49     Assessment/Plan 80 year old female COPD, CAD with history of PCI to LCx and LAD, diastolic heart failure last EF 50 to 55% June 2021, previously EF in the 17s  with ischemic cardiomyopathy, as well as history of breast cancer, CKD 3a, HTN and history of Covid December 2021, presenting with a 2-week history of cough with right pleuritic chest pain, fever and generalized weakness    Bilobar bacterial pneumonia with probable sepsis   History of COVID-19, December 2021 -Patient with 2 weeks of cough weakness shortness of breath and pleuritic chest pain, febrile on arrival 100.3 mild tachycardia 95, tachypnea of 23 and mild hypoxia of 93, WBC 12,500, procalcitonin 0.22,  lactic acid 1.5 - Probable sepsis -CTA chest consistent with right upper and lower lobe bronchopneumonia -Levaquin IV due to history of anaphylaxis with PCNs -IV hydration -Supplemental oxygen to keep sats over 93% - PT, SLP and nutritionist consult    Essential hypertension -Continue enalapril    COPD (chronic obstructive pulmonary disease) (HCC) -Continue Advair and albuterol inhalers    Coronary artery disease involving native coronary artery of native heart without angina pectoris -History of PCI to LCx and LAD -Continue aspirin, clopidogrel, rosuvastatin, NTG as needed    Ischemic cardiomyopathy    Chronic diastolic CHF (congestive heart failure) ([facility] -Appears euvolemic -Last EF 50 to 55% 09/2019.  Previously had systolic failure with EF 30 to 35% stage 3a chronic kidney disease (HCC) -Continue enalapril, carvedilol, spironolactone, furosemide -Monitor daily weights in view of IV hydration   History of breast cancer -continue Femara  DVT prophylaxis: Lovenox  Code Status: full code  Family Communication:  none  Disposition Plan: Back to previous home environment Consults called: none  Status:At the time of admission, it appears that the appropriate admission status for this patient is INPATIENT. This is judged to be reasonable and necessary in order  to provide the required intensity of service to ensure the patient's safety given the presenting symptoms, physical exam  findings, and initial radiographic and laboratory data in the context of their  Comorbid conditions.   Patient requires inpatient status due to high intensity of service, high risk for further deterioration and high frequency of surveillance required.   I certify that at the point of admission it is my clinical judgment that the patient will require inpatient hospital care spanning beyond [facility] MD [facility]     06/17/2020, 9:23 PM

## 2020-06-17 NOTE — ED Triage Notes (Signed)
Generalized weakness at home with reported near fall with family. Pt reports not getting out of bed today. Patient alert and oriented x4 and following commands appropriately. Denies pain, reports mild SOB ongoing since having COVID in December. Pt breathing unlabored on arrival with symmetric chest rise and fall. Pt speaking in full sentences.

## 2020-06-18 ENCOUNTER — Encounter: Payer: Self-pay | Admitting: Internal Medicine

## 2020-06-18 DIAGNOSIS — J189 Pneumonia, unspecified organism: Secondary | ICD-10-CM | POA: Diagnosis not present

## 2020-06-18 LAB — LACTIC ACID, PLASMA: Lactic Acid, Venous: 0.8 mmol/L (ref 0.5–1.9)

## 2020-06-18 LAB — HIV ANTIBODY (ROUTINE TESTING W REFLEX): HIV Screen 4th Generation wRfx: NONREACTIVE

## 2020-06-18 MED ORDER — VITAMIN D 25 MCG (1000 UNIT) PO TABS
1000.0000 [IU] | ORAL_TABLET | Freq: Every day | ORAL | Status: DC
  Administered 2020-06-18 – 2020-06-20 (×3): 1000 [IU] via ORAL
  Filled 2020-06-18 (×3): qty 1

## 2020-06-18 MED ORDER — ENALAPRIL MALEATE 5 MG PO TABS: 5.0000 mg | ORAL_TABLET | Freq: Every day | ORAL | Status: DC

## 2020-06-18 MED ORDER — ENSURE ENLIVE PO LIQD
237.0000 mL | Freq: Two times a day (BID) | ORAL | Status: DC
  Administered 2020-06-19 – 2020-06-20 (×4): 237 mL via ORAL

## 2020-06-18 MED ORDER — SPIRONOLACTONE 25 MG PO TABS
12.5000 mg | ORAL_TABLET | Freq: Every day | ORAL | Status: DC
Start: 2020-06-18 — End: 2020-06-20
  Administered 2020-06-18 – 2020-06-20 (×3): 12.5 mg via ORAL
  Filled 2020-06-18: qty 0.5
  Filled 2020-06-18 (×2): qty 1
  Filled 2020-06-18 (×2): qty 0.5

## 2020-06-18 MED ORDER — PANTOPRAZOLE SODIUM 40 MG PO TBEC
40.0000 mg | DELAYED_RELEASE_TABLET | Freq: Every day | ORAL | Status: DC
  Administered 2020-06-18 – 2020-06-20 (×3): 40 mg via ORAL
  Filled 2020-06-18 (×3): qty 1

## 2020-06-18 MED ORDER — DULOXETINE HCL 30 MG PO CPEP: 30.0000 mg | ORAL_CAPSULE | ORAL | Status: DC

## 2020-06-18 MED ORDER — LETROZOLE 2.5 MG PO TABS
2.5000 mg | ORAL_TABLET | Freq: Every day | ORAL | Status: DC
  Administered 2020-06-18 – 2020-06-20 (×3): 2.5 mg via ORAL
  Filled 2020-06-18 (×3): qty 1

## 2020-06-18 MED ORDER — BACLOFEN 10 MG PO TABS
10.0000 mg | ORAL_TABLET | Freq: Every day | ORAL | Status: DC
  Administered 2020-06-18 – 2020-06-19 (×2): 10 mg via ORAL
  Filled 2020-06-18 (×3): qty 1

## 2020-06-18 MED ORDER — NITROGLYCERIN 0.4 MG SL SUBL: 0.4000 mg | SUBLINGUAL_TABLET | SUBLINGUAL | Status: DC | PRN

## 2020-06-18 MED ORDER — MONTELUKAST SODIUM 10 MG PO TABS
10.0000 mg | ORAL_TABLET | Freq: Every day | ORAL | Status: DC
  Administered 2020-06-18 – 2020-06-19 (×2): 10 mg via ORAL
  Filled 2020-06-18 (×2): qty 1

## 2020-06-18 MED ORDER — SODIUM CHLORIDE 0.9 % IV BOLUS (SEPSIS)
500.0000 mL | Freq: Once | INTRAVENOUS | Status: AC
  Administered 2020-06-18: 500 mL via INTRAVENOUS

## 2020-06-18 MED ORDER — ROSUVASTATIN CALCIUM 10 MG PO TABS
20.0000 mg | ORAL_TABLET | Freq: Every day | ORAL | Status: DC
  Filled 2020-06-18 (×2): qty 2

## 2020-06-18 MED ORDER — DULOXETINE HCL 60 MG PO CPEP: 60.0000 mg | ORAL_CAPSULE | Freq: Every day | ORAL | Status: DC

## 2020-06-18 MED ORDER — CARVEDILOL 3.125 MG PO TABS
6.2500 mg | ORAL_TABLET | Freq: Two times a day (BID) | ORAL | Status: DC
  Administered 2020-06-18 – 2020-06-20 (×5): 6.25 mg via ORAL
  Filled 2020-06-18 (×5): qty 2

## 2020-06-18 MED ORDER — ALBUTEROL SULFATE HFA 108 (90 BASE) MCG/ACT IN AERS
2.0000 | INHALATION_SPRAY | Freq: Four times a day (QID) | RESPIRATORY_TRACT | Status: DC | PRN
  Filled 2020-06-18: qty 6.7

## 2020-06-18 MED ORDER — DULOXETINE HCL 60 MG PO CPEP
90.0000 mg | ORAL_CAPSULE | Freq: Every day | ORAL | Status: DC
  Administered 2020-06-18 – 2020-06-20 (×3): 90 mg via ORAL
  Filled 2020-06-18 (×3): qty 1

## 2020-06-18 MED ORDER — CLOPIDOGREL BISULFATE 75 MG PO TABS
75.0000 mg | ORAL_TABLET | Freq: Every day | ORAL | Status: DC
  Administered 2020-06-19 – 2020-06-20 (×2): 75 mg via ORAL
  Filled 2020-06-18 (×2): qty 1

## 2020-06-18 MED ORDER — ADULT MULTIVITAMIN W/MINERALS CH
1.0000 | ORAL_TABLET | Freq: Every day | ORAL | Status: DC
  Administered 2020-06-19 – 2020-06-20 (×2): 1 via ORAL
  Filled 2020-06-18 (×2): qty 1

## 2020-06-18 MED ORDER — MELATONIN 5 MG PO TABS
10.0000 mg | ORAL_TABLET | Freq: Every evening | ORAL | Status: DC | PRN
  Administered 2020-06-18 – 2020-06-19 (×2): 10 mg via ORAL
  Filled 2020-06-18 (×2): qty 2

## 2020-06-18 MED ORDER — FLUTICASONE PROPIONATE 50 MCG/ACT NA SUSP
2.0000 | Freq: Every day | NASAL | Status: DC
  Administered 2020-06-18 – 2020-06-20 (×3): 2 via NASAL
  Filled 2020-06-18: qty 16

## 2020-06-18 MED ORDER — MOMETASONE FURO-FORMOTEROL FUM 200-5 MCG/ACT IN AERO
2.0000 | INHALATION_SPRAY | Freq: Two times a day (BID) | RESPIRATORY_TRACT | Status: DC
  Administered 2020-06-18 – 2020-06-20 (×5): 2 via RESPIRATORY_TRACT
  Filled 2020-06-18: qty 8.8

## 2020-06-18 NOTE — TOC Initial Note (Signed)
Transition of Care [facility]) - Initial/Assessment Note    Patient Details  Name: Krista Cisneros MRN: [id number] Date of Birth: August 19, 1940  Transition of Care [facility]) CM/SW Contact:    Margarito Liner, LCSW Phone Number: 06/18/2020, 11:21 AM  Clinical Narrative:  CSW met with patient. No supports at bedside. CSW introduced role and explained that PT recommendations would be discussed. Patient is not interested in SNF placement but is willing to consider home health. She already has a nurse that comes out to check on her through [employer]. Patient hoping she can eventually go to cardiac rehab. Patient reports that she has a walker and wheelchair at home already. She lives with her daughter and son-in-law. No further concerns. CSW encouraged patient to contact CSW as needed. CSW will continue to follow patient for support and facilitate return home when stable.               Expected Discharge Plan: Home w Home Health Services Barriers to Discharge: Continued Medical Work up   Patient Goals and CMS Choice        Expected Discharge Plan and Services Expected Discharge Plan: Home w Home Health Services     Post Acute Care Choice: Home Health Living arrangements for the past 2 months: Single Family Home                                      Prior Living Arrangements/Services Living arrangements for the past 2 months: Single Family Home Lives with:: Adult Children Patient language and need for interpreter reviewed:: Yes Do you feel safe going back to the place where you live?: Yes      Need for Family Participation in Patient Care: Yes (Comment) Care giver support system in place?: Yes (comment) Current home services: DME Criminal Activity/Legal Involvement Pertinent to Current Situation/Hospitalization: No - Comment as needed  Activities of Daily Living Home Assistive Devices/Equipment: Cane (specify quad or straight),Walker (specify type) ADL Screening (condition  at time of admission) Patient's cognitive ability adequate to safely complete daily activities?: Yes Is the patient deaf or have difficulty hearing?: No Does the patient have difficulty seeing, even when wearing glasses/contacts?: No Does the patient have difficulty concentrating, remembering, or making decisions?: No Patient able to express need for assistance with ADLs?: Yes Does the patient have difficulty dressing or bathing?: No Independently performs ADLs?: No Communication: Independent Dressing (OT): Needs assistance Is this a change from baseline?: Change from baseline, expected to last <3days Grooming: Independent Feeding: Independent Bathing: Needs assistance Is this a change from baseline?: Change from baseline, expected to last <3 days Toileting: Needs assistance Is this a change from baseline?: Change from baseline, expected to last <3 days In/Out Bed: Needs assistance Is this a change from baseline?: Change from baseline, expected to last <3 days Walks in Home: Needs assistance Does the patient have difficulty walking or climbing stairs?: Yes Weakness of Legs: Both Weakness of Arms/Hands: None  Permission Sought/Granted Permission sought to share information with : Oceanographer granted to share information with : Yes, Verbal Permission Granted     Permission granted to share info w AGENCY: Home Health Agencies        Emotional Assessment Appearance:: Appears stated age Attitude/Demeanor/Rapport: Engaged,Gracious Affect (typically observed): Appropriate,Calm,Pleasant Orientation: : Oriented to Self,Oriented to Place,Oriented to  Time,Oriented to Situation Alcohol / Substance Use: Not Applicable Psych Involvement: No (comment)  Admission diagnosis:  CAP (community acquired pneumonia) [J18.9] Community acquired pneumonia of right lung, unspecified part of lung [J18.9] Patient Active Problem List   Diagnosis Date Noted  . CAP  (community acquired pneumonia) 06/17/2020  . Generalized weakness 06/17/2020  . History of COVID-19, December 2021 06/17/2020  . History of breast cancer 06/17/2020  . Chronic diastolic CHF (congestive heart failure) ([place]) 06/17/2020  . Stage 3a chronic kidney disease ([place]) 06/17/2020  . Sepsis due to Escherichia coli (E. coli) ([place]) 10/10/2019  . Acute lower UTI 10/10/2019  . SIRS (systemic inflammatory response syndrome) ([place]) 10/07/2019  . Constipation 10/07/2019  . Urinary retention 10/07/2019  . Iron deficiency anemia 05/30/2019  . Ischemic cardiomyopathy 06/03/2018  . Coronary artery disease involving native coronary artery of native heart with angina pectoris ([place]) 05/14/2018  . Chronic systolic heart failure ([place])   . Coronary artery disease involving native coronary artery of native heart without angina pectoris 04/22/2018  . Non-STEMI (non-ST elevated myocardial infarction) ([place]) 04/22/2018  . NSTEMI (non-ST elevated myocardial infarction) ([place]) 04/09/2018  . Rectal bleeding 03/22/2018  . Malignant neoplasm of upper-outer quadrant of right breast in female, estrogen receptor negative ([place]) 06/19/2017  . Incarcerated hernia 03/26/2017  . Incarcerated inguinal hernia   . Acute kidney injury ([place]) 02/07/2017  . Hypomagnesemia 01/30/2017  . Goals of care, counseling/discussion 10/20/2016  . Malignant neoplasm of right female breast ([place]) 10/16/2016  . Hyperlipidemia, mixed 10/08/2016  . Chronic venous insufficiency 09/14/2016  . GERD (gastroesophageal reflux disease) 09/14/2016  . Pain in limb 05/29/2016  . Essential hypertension 05/29/2016  . COPD (chronic obstructive pulmonary disease) ([place]) 05/29/2016  . Hardening of the aorta (main artery of the heart) ([place]) 01/07/2016  . Hyperglycemia, unspecified 01/07/2016  . Neuropathy 11/15/2015  . Severe recurrent major depression without psychotic features ([place]) 09/10/2015  . Allergic rhinitis 04/10/2014  . Depression 04/10/2014   . Obesity, unspecified 04/10/2014   PCP:  Sharyne Peach, MD Pharmacy:   [id number] [place], [address] [address] Phone: [phone] Fax: [phone]     Social Determinants of Health (SDOH) Interventions    Readmission Risk Interventions Readmission Risk Prevention Plan 10/09/2019  Transportation Screening Complete  Medication Review (RN Care Manager) Complete  [place] Not Applicable  Some recent data might be hidden

## 2020-06-18 NOTE — Progress Notes (Signed)
SLP Cancellation Note  Patient Details Name: Krista Cisneros MRN: [id number] DOB: 1940-11-27   Cancelled treatment:       Reason Eval/Treat Not Completed: SLP screened, no needs identified, will sign off  Chart reviewed, pt and son-in-law appear to be good historians. Pt consumed thin water via straw without any overt s/s of aspiration. Son-in-law states that she didn't have any coughing or choking with lunch and that she doesn't exhibit s/s of aspiration at home.   Happi B. Rutherford Nail M.S., CCC-SLP, [place] Office [phone]   Happi Rutherford Nail 06/18/2020, 2:26 PM

## 2020-06-18 NOTE — Evaluation (Signed)
Physical Therapy Evaluation Patient Details Name: Krista Cisneros MRN: [id number] DOB: Mar 22, 1941 Today's Date: 06/18/2020   History of Present Illness  Krista Cisneros is a 80 y.o. female with medical history significant for COPD, CAD with history of PCI to LCx and LAD, diastolic heart failure last EF 50 to 55% June 2021, previously EF in the 44s with ischemic cardiomyopathy, as well as history of breast cancer, CKD 3a, HTN and history of Covid December 2021 from which she had completely recovered, who presents with a 2-week history of a cough now with right-sided pleuritic chest pain on coughing and a 1 day history of fever.  It is associated with generalized weakness to where she has been staying in bed, due to difficulty standing up.  Clinical Impression  Patient received in bed, pleasant, agrees to PT session. Patient requires mod assist for bed mobility to get to edge of bed. Difficulty with trunk elevation and scooting. Patient requires min assist for sit to stand. Min guard/min assist for taking a few steps from bed to recliner using RW.  Patient is limited by sob and weakness. Patient will continue to benefit from skilled PT while here to improve strength, activity tolerance and functional mobility.       Follow Up Recommendations SNF;Supervision for mobility/OOB    Equipment Recommendations  None recommended by PT    Recommendations for Other Services       Precautions / Restrictions Precautions Precautions: Fall Restrictions Weight Bearing Restrictions: No      Mobility  Bed Mobility Overal bed mobility: Needs Assistance Bed Mobility: Supine to Sit     Supine to sit: Mod assist     General bed mobility comments: patient requires assist to raise trunk to seated position and to scoot hips forward to edge of bed.    Transfers Overall transfer level: Needs assistance Equipment used: Rolling walker (2 wheeled) Transfers: Sit to/from Stand Sit to Stand: Min  assist         General transfer comment: cues for hand placement.  Ambulation/Gait Ambulation/Gait assistance: Min guard Gait Distance (Feet): 3 Feet Assistive device: Rolling walker (2 wheeled) Gait Pattern/deviations: Step-to pattern;Decreased step length - right;Decreased step length - left;Decreased stride length Gait velocity: Decr   General Gait Details: able to take a few steps to recliner from bed. Patient with sob, coughing during session.  Stairs            Wheelchair Mobility    Modified Rankin (Stroke Patients Only)       Balance Overall balance assessment: Needs assistance Sitting-balance support: Feet supported;Bilateral upper extremity supported Sitting balance-Leahy Scale: Fair Sitting balance - Comments: Difficulty maintianing balance in sitting   Standing balance support: Bilateral upper extremity supported;During functional activity Standing balance-Leahy Scale: Fair Standing balance comment: reliant on B UE support and min guard for safety                             Pertinent Vitals/Pain Pain Assessment: No/denies pain    Home Living Family/patient expects to be discharged to:: Private residence Living Arrangements: Children Available Help at Discharge: Family;Available 24 hours/day Type of Home: House Home Access: Stairs to enter   [employer] of Steps: 3 Home Layout: Two level;Bed/bath upstairs Home Equipment: Walker - 2 wheels;Cane - single point;Bedside commode Additional Comments: Patient lives with daughter and son in law. Her bed/bath are on 2nd floor.    Prior Function Level of Independence:  Independent         Comments: Uses cane occasionally when feeling unsteady     Hand Dominance        Extremity/Trunk Assessment   Upper Extremity Assessment Upper Extremity Assessment: Generalized weakness    Lower Extremity Assessment Lower Extremity Assessment: Generalized weakness    Cervical /  Trunk Assessment Cervical / Trunk Assessment: Normal  Communication   Communication: No difficulties  Cognition Arousal/Alertness: Awake/alert Behavior During Therapy: WFL for tasks assessed/performed Overall Cognitive Status: Within Functional Limits for tasks assessed                                        General Comments      Exercises     Assessment/Plan    PT Assessment Patient needs continued PT services  PT Problem List Decreased strength;Decreased mobility;Decreased activity tolerance;Decreased balance;Cardiopulmonary status limiting activity;Decreased knowledge of use of DME;Decreased safety awareness       PT Treatment Interventions DME instruction;Therapeutic activities;Gait training;Therapeutic exercise;Patient/family education;Stair training;Balance training;Functional mobility training    PT Goals (Current goals can be found in the Care Plan section)  Acute Rehab PT Goals Patient Stated Goal: to improve PT Goal Formulation: With patient Time For Goal Achievement: 07/02/20 Potential to Achieve Goals: Good    Frequency Min 2X/week   Barriers to discharge Inaccessible home environment steps to enter and bedroom on second floor    Co-evaluation               AM-PAC PT "6 Clicks" Mobility  Outcome Measure Help needed turning from your back to your side while in a flat bed without using bedrails?: A Little Help needed moving from lying on your back to sitting on the side of a flat bed without using bedrails?: A Lot Help needed moving to and from a bed to a chair (including a wheelchair)?: A Little Help needed standing up from a chair using your arms (e.g., wheelchair or bedside chair)?: A Little Help needed to walk in hospital room?: A Lot Help needed climbing 3-5 steps with a railing? : A Lot 6 Click Score: 15    End of Session Equipment Utilized During Treatment: Gait belt;Oxygen Activity Tolerance: Patient limited by  fatigue Patient left: in chair;with call bell/phone within reach;with nursing/sitter in room Nurse Communication: Mobility status PT Visit Diagnosis: Muscle weakness (generalized) (M62.81);Other abnormalities of gait and mobility (R26.89);Difficulty in walking, not elsewhere classified (R26.2)    Time: 1020-1045 PT Time Calculation (min) (ACUTE ONLY): 25 min   Charges:   PT Evaluation $PT Eval Moderate Complexity: 1 Mod PT Treatments $Therapeutic Activity: 8-22 mins        Kentavius Dettore, PT, GCS 06/18/20,10:59 AM

## 2020-06-18 NOTE — TOC Progression Note (Signed)
Transition of Care [facility]) - Progression Note    Patient Details  Name: Danasha Melman MRN: [id number] Date of Birth: [birth year]  Transition of Care [facility]) CM/SW [place], RN Phone Number: 06/18/2020, 3:12 PM  Clinical Narrative:  TOC in room.  Son-in-law at bedside. TOC informed patient and son-in-law of role and proposed plan:  [facility] can provide Nurse and PT Home Health.  Patient and son-in-law agreeable to plan and provider.  Son-in-law stated MD called and informed them of the plan as well.  No confirmed discharge date at this time.  TOC to follow.   Expected Discharge Plan: [place] Barriers to Discharge: Continued Medical Work up  Expected Discharge Plan and Services Expected Discharge Plan: [place] Choice: [place] arrangements for the past 2 months: Single Family Home                                       Social Determinants of Health (SDOH) Interventions    Readmission Risk Interventions Readmission Risk Prevention Plan 10/09/2019  Transportation Screening Complete  Medication Review Press photographer) Complete  Palliative Care Screening Not [place] Not Applicable  Some recent data might be hidden

## 2020-06-18 NOTE — Progress Notes (Signed)
PROGRESS NOTE    Krista Cisneros  SKA:[id number] DOB: [birth year] DOA: 06/17/2020 PCP: Sharyne Peach, MD   Brief Narrative:  80 y.o. female with medical history significant for COPD, CAD with history of PCI to LCx and LAD, diastolic heart failure last EF 50 to 55% June 2021, previously EF in the 46s with ischemic cardiomyopathy, as well as history of breast cancer, CKD 3a, HTN and history of Covid December 2021 from which she had completely recovered, who presents with a 2-week history of a cough now with right-sided pleuritic chest pain on coughing and a 1 day history of fever.  It is associated with generalized weakness to where she has been staying in bed, due to difficulty standing up.  When trying to get out of bed on the day of arrival but slid to the floor, not sustaining any injury.  She has  mild shortness of breath but no worse than her baseline for her COPD.    Chest imaging significant for patchy consolidation in the right upper and right lower lobe consistent with bronchopneumonia.  Patient started on intravenous Levaquin.  Respiratory status slowly improving.  Assessment & Plan:   Active Problems:   Essential hypertension   COPD (chronic obstructive pulmonary disease) (HCC)   Coronary artery disease involving native coronary artery of native heart without angina pectoris   Ischemic cardiomyopathy   CAP (community acquired pneumonia)   Generalized weakness   History of COVID-19, December 2021   History of breast cancer   Chronic diastolic CHF (congestive heart failure) (HCC)   Stage 3a chronic kidney disease ([place])  Community-acquired pneumonia Sepsis secondary to above Sepsis criteria met with fever, tachycardia, tachypnea, leukocytosis Likely source of sepsis pneumonia Patient hemodynamically stable Currently on room air Plan: Continue IV Levaquin Supplemental IV fluids Supplemental oxygen if necessary Monitor fever curve Follow cultures  Essential  hypertension Continue home Vasotec  COPD (chronic obstructive pulmonary disease) Continue Advair and albuterol inhalers  Coronary artery disease involving native coronary artery of native heart without angina pectoris History of PCI to LCx and LAD Continue aspirin, clopidogrel, rosuvastatin, NTG as needed    Ischemic cardiomyopathy    Chronic diastolic CHF (congestive heart failure) (HC Appears euvolemic Last EF 50 to 55% 09/2019.  Previously had systolic failure with EF 30 to 35% stage 3a chronic kidney disease (HCC)Continue enalapril, carvedilol, spironolactone, furosemideMonitor daily weights in view of IV hydration   History of breast cancer -continue Femara   DVT prophylaxis: SQ Lovenox Code Status: Full Family Communication: Son-in-law Gerald Stabs [phone] Disposition Plan: Status is: Inpatient  Remains inpatient appropriate because:Inpatient level of care appropriate due to severity of illness   Dispo: The patient is from: Home              Anticipated d/c is to: Home              Patient currently is not medically stable to d/c.   Difficult to place patient No  Patient with community-acquired pneumonia.  Anticipate 48 hours prior to disposition.      Level of care: Med-Surg  Consultants:   None  Procedures:   None  Antimicrobials:   Levaquin    Subjective: Patient seen and examined.  Reports improvement in respiratory status and weakness.  Objective: Vitals:   06/18/20 0446 06/18/20 0803 06/18/20 1049 06/18/20 1130  BP: (!) 117/56 (!) 126/58  122/60  Pulse: 86 83  91  Resp: $Remo'18 16  17  'SUQwy$ Temp: (!) 97 F (36.1  C) 98.4 F (36.9 C)  99 F (37.2 C)  TempSrc:  Oral  Oral  SpO2: 95% 96% 93% 96%  Weight:      Height:        Intake/Output Summary (Last 24 hours) at 06/18/2020 1430 Last data filed at 06/18/2020 1025 Gross per 24 hour  Intake 1326.88 ml  Output --  Net 1326.88 ml   Filed Weights   06/17/20 1905 06/18/20 0101  Weight: 63.5  kg 67.8 kg    Examination:  General exam: Appears calm and comfortable  Respiratory system: Scattered crackles bilaterally, greater on right.  Normal work of breathing.  Room air Cardiovascular system: S1 & S2 heard, RRR. No JVD, murmurs, rubs, gallops or clicks. No pedal edema. Gastrointestinal system: Abdomen is nondistended, soft and nontender. No organomegaly or masses felt. Normal bowel sounds heard. Central nervous system: Alert and oriented. No focal neurological deficits. Extremities: Symmetric 5 x 5 power. Skin: No rashes, lesions or ulcers Psychiatry: Judgement and insight appear normal. Mood & affect appropriate.     Data Reviewed: I have personally reviewed following labs and imaging studies  CBC: Recent Labs  Lab 06/17/20 1916  WBC 12.5*  NEUTROABS 9.2*  HGB 13.7  HCT 43.1  MCV 86.7  PLT 885   Basic Metabolic Panel: Recent Labs  Lab 06/17/20 1916  NA 135  K 4.4  CL 104  CO2 21*  GLUCOSE 102*  BUN 17  CREATININE 1.21*  CALCIUM 8.6*   GFR: Estimated Creatinine Clearance: 34 mL/min (A) (by C-G formula based on SCr of 1.21 mg/dL (H)). Liver Function Tests: Recent Labs  Lab 06/17/20 1916  AST 22  ALT 10  ALKPHOS 46  BILITOT 1.3*  PROT 6.9  ALBUMIN 3.2*   No results for input(s): LIPASE, AMYLASE in the last 168 hours. No results for input(s): AMMONIA in the last 168 hours. Coagulation Profile: No results for input(s): INR, PROTIME in the last 168 hours. Cardiac Enzymes: No results for input(s): CKTOTAL, CKMB, CKMBINDEX, TROPONINI in the last 168 hours. BNP (last 3 results) No results for input(s): PROBNP in the last 8760 hours. HbA1C: No results for input(s): HGBA1C in the last 72 hours. CBG: No results for input(s): GLUCAP in the last 168 hours. Lipid Profile: No results for input(s): CHOL, HDL, LDLCALC, TRIG, CHOLHDL, LDLDIRECT in the last 72 hours. Thyroid Function Tests: Recent Labs    06/17/20 1916  TSH 3.566   Anemia Panel: No  results for input(s): VITAMINB12, FOLATE, FERRITIN, TIBC, IRON, RETICCTPCT in the last 72 hours. Sepsis Labs: Recent Labs  Lab 06/17/20 1916 06/17/20 1921 06/18/20 0644  PROCALCITON 0.22  --   --   LATICACIDVEN  --  1.5 0.8    Recent Results (from the past 240 hour(s))  Blood culture (routine x 2)     Status: None (Preliminary result)   Collection Time: 06/17/20  7:30 PM   Specimen: BLOOD  Result Value Ref Range Status   Specimen Description BLOOD BLOOD LEFT FOREARM  Final   Special Requests   Final    BOTTLES DRAWN AEROBIC AND ANAEROBIC Blood Culture adequate volume   Culture   Final    NO GROWTH < 12 HOURS Performed at [facility], [address] [place], [place] [address]    Report Status PENDING  Incomplete  Resp Panel by RT-PCR (Flu A&B, Covid) Nasopharyngeal Swab     Status: None   Collection Time: 06/17/20  8:00 PM   Specimen: Nasopharyngeal Swab; Nasopharyngeal(NP) swabs  in vial transport medium  Result Value Ref Range Status   SARS Coronavirus 2 by RT PCR NEGATIVE NEGATIVE Final    Comment: (NOTE) SARS-CoV-2 target nucleic acids are NOT DETECTED.  The SARS-CoV-2 RNA is generally detectable in upper respiratory specimens during the acute phase of infection. The lowest concentration of SARS-CoV-2 viral copies this assay can detect is 138 copies/mL. A negative result does not preclude SARS-Cov-2 infection and should not be used as the sole basis for treatment or other patient management decisions. A negative result may occur with  improper specimen collection/handling, submission of specimen other than nasopharyngeal swab, presence of viral mutation(s) within the areas targeted by this assay, and inadequate number of viral copies(<138 copies/mL). A negative result must be combined with clinical observations, patient history, and epidemiological information. The expected result is Negative.  Fact Sheet for Patients:   EntrepreneurPulse.com.au  Fact Sheet for Healthcare Providers:  IncredibleEmployment.be  This test is no t yet approved or cleared by the [place] FDA and  has been authorized for detection and/or diagnosis of SARS-CoV-2 by FDA under an Emergency Use Authorization (EUA). This EUA will remain  in effect (meaning this test can be used) for the duration of the COVID-19 declaration under Section 564(b)(1) of the Act, 21 U.S.C.section 360bbb-3(b)(1), unless the authorization is terminated  or revoked sooner.       Influenza A by PCR NEGATIVE NEGATIVE Final   Influenza B by PCR NEGATIVE NEGATIVE Final    Comment: (NOTE) The Xpert Xpress SARS-CoV-2/FLU/RSV plus assay is intended as an aid in the diagnosis of influenza from Nasopharyngeal swab specimens and should not be used as a sole basis for treatment. Nasal washings and aspirates are unacceptable for Xpert Xpress SARS-CoV-2/FLU/RSV testing.  Fact Sheet for Patients: EntrepreneurPulse.com.au  Fact Sheet for Healthcare Providers: IncredibleEmployment.be  This test is not yet approved or cleared by the [place] FDA and has been authorized for detection and/or diagnosis of SARS-CoV-2 by FDA under an Emergency Use Authorization (EUA). This EUA will remain in effect (meaning this test can be used) for the duration of the COVID-19 declaration under Section 564(b)(1) of the Act, 21 U.S.C. section 360bbb-3(b)(1), unless the authorization is terminated or revoked.  Performed at [facility], [address] [place], [place] [address]   Blood culture (routine x 2)     Status: None (Preliminary result)   Collection Time: 06/17/20  9:50 PM   Specimen: BLOOD  Result Value Ref Range Status   Specimen Description BLOOD BLOOD LEFT HAND  Final   Special Requests   Final    BOTTLES DRAWN AEROBIC AND ANAEROBIC Blood Culture adequate volume   Culture    Final    NO GROWTH < 12 HOURS Performed at [facility], [address] [place], [place] [address]    Report Status PENDING  Incomplete         Radiology Studies: DG Chest 1 View  Result Date: 06/17/2020 CLINICAL DATA:  Weakness EXAM: CHEST  1 VIEW COMPARISON:  10/07/2019 and prior. FINDINGS: Left chest wall Port-A-Cath tip overlies the distal SVC. Right thoracic surgical clips. No pneumothorax or pleural effusion. Emphysematous changes with stable crowding of the lung base markings. New nodular right mid to lower lung opacities. Cardiomediastinal silhouette within normal limits. No acute osseous abnormality. IMPRESSION: New right lung nodular opacities. Differential includes infectious/inflammatory foci however cannot exclude alternative pathologies. Consider chest CT for better characterisation. Electronically Signed   By: Milus Mallick.D.  On: 06/17/2020 19:51   CT Angio Chest PE W and/or Wo Contrast  Result Date: 06/17/2020 CLINICAL DATA:  Shortness of breath, generalized weakness, history of right breast cancer EXAM: CT ANGIOGRAPHY CHEST WITH CONTRAST TECHNIQUE: Multidetector CT imaging of the chest was performed using the standard protocol during bolus administration of intravenous contrast. Multiplanar CT image reconstructions and MIPs were obtained to evaluate the vascular anatomy. CONTRAST:  12mL OMNIPAQUE IOHEXOL 350 MG/ML SOLN COMPARISON:  06/17/2020, 04/09/2018 FINDINGS: Cardiovascular: This is a technically adequate evaluation of the pulmonary vasculature. No filling defects or pulmonary emboli. The heart is unremarkable without pericardial effusion. Dense atherosclerosis throughout the coronary vasculature. Normal caliber of the thoracic aorta. Moderate atherosclerosis of the aortic arch and descending thoracic aorta. Mediastinum/Nodes: Borderline enlarged pretracheal lymph node measuring 10 mm image 25/4, previously having measured 6 mm. No other pathologic  adenopathy. Thyroid gland, trachea, and esophagus demonstrate no significant findings. Moderate hiatal hernia. Lungs/Pleura: Severe centrilobular emphysema, upper lobe predominant. Focal areas of consolidation are seen within the right upper and right lower lobes, which could reflect bronchopneumonia. No effusion or pneumothorax. Central airways are patent. Upper Abdomen: No acute abnormality. Musculoskeletal: No acute or destructive bony lesions. Postsurgical changes are seen within the right breast and axilla. Reconstructed images demonstrate no additional findings. Review of the MIP images confirms the above findings. IMPRESSION: 1. Patchy consolidation within the right upper and right lower lobe, consistent with bronchopneumonia. Followup PA and lateral chest X-ray is recommended in 3-4 weeks following trial of antibiotic therapy to ensure resolution and exclude underlying malignancy. 2. Borderline enlarged pretracheal lymph node, which could be reactive. Continued follow-up may be warranted given patient's history of breast cancer. 3. Severe upper lobe predominant centrilobular emphysema. 4. Moderate hiatal hernia. 5. Aortic Atherosclerosis (ICD10-I70.0). Coronary artery atherosclerosis. Electronically Signed   By: Randa Ngo M.D.   On: 06/17/2020 20:49        Scheduled Meds: . baclofen  10 mg Oral QHS  . carvedilol  6.25 mg Oral BID  . cholecalciferol  1,000 Units Oral Daily  . [START ON 06/19/2020] clopidogrel  75 mg Oral Q breakfast  . DULoxetine  90 mg Oral Daily  . enoxaparin (LOVENOX) injection  40 mg Subcutaneous Q24H  . fluticasone  2 spray Each Nare Daily  . letrozole  2.5 mg Oral Daily  . mometasone-formoterol  2 puff Inhalation BID  . montelukast  10 mg Oral QHS  . pantoprazole  40 mg Oral Daily  . rosuvastatin  20 mg Oral Daily  . spironolactone  12.5 mg Oral Daily   Continuous Infusions: . sodium chloride 75 mL/hr at 06/18/20 1032  . [START ON 06/19/2020] levofloxacin  (LEVAQUIN) IV       LOS: 1 day    Time spent: 25 minutes    Sidney Ace, MD [facility] Pager [phone]  If 7PM-7AM, please contact night-coverage 06/18/2020, 2:30 PM

## 2020-06-18 NOTE — Progress Notes (Signed)
Initial Nutrition Assessment  DOCUMENTATION CODES:   Not applicable  INTERVENTION:   Ensure Enlive po BID, each supplement provides 350 kcal and 20 grams of protein  Magic cup TID with meals, each supplement provides 290 kcal and 9 grams of protein  MVI po daily   Liberalize diet   Pt at high refeed risk; recommend monitor potassium, magnesium and phosphorus labs daily until stable  NUTRITION DIAGNOSIS:   Inadequate oral intake related to acute illness as evidenced by per patient/family report.  GOAL:   Patient will meet greater than or equal to 90% of their needs  MONITOR:   PO intake,Supplement acceptance,Labs,Skin,Weight trends,I & O's  REASON FOR ASSESSMENT:   Consult Assessment of nutrition requirement/status  ASSESSMENT:   80 y.o. female with medical history significant for COPD, CAD with history of PCI to LCx and LAD, NSTEMI, diastolic heart failure last EF 50 to 55%, breast cancer s/p chemo/XRT, CKD 3a, HTN and recent Covid 19 in December 2021 who is admitted with PNA and sepsis   Met with pt and pt's son-in-law in room today. Pt is a poor historian but reports decreased appetite and oral intake for several days pta. Pt reports that her appetite is fair in hospital. Pt reports eating 1/2 of a chicken salad sandwich and some fruit for lunch today. Pt is documented to have eaten 50% of her breakfast today. Pt reports that she does not drink supplements at home. Son-in-law reports that patient has Ensure at home in the fridge but that they often have to throw it away because it expires. RD discussed with pt the importance of adequate nutrition needed to preserve lean muscle. Pt is willing to try chocolate Ensure in hospital. RD will add supplements and MVI to help pt meet her estimated needs. RD will also liberalize the heart healthy portion of pt's diet as this is restrictive of protein. Per chart, pt appears weight stable at baseline.   Medications reviewed and  include: vitamin D3, plavix, lovenox, protonix, aldactone, NaCl @75ml/hr  Labs reviewed: creat 1.21(H) Wbc- 12.6(H)  NUTRITION - FOCUSED PHYSICAL EXAM:  Flowsheet Row Most Recent Value  Orbital Region No depletion  Upper Arm Region No depletion  Thoracic and Lumbar Region No depletion  Buccal Region No depletion  Temple Region No depletion  Clavicle Bone Region Mild depletion  Clavicle and Acromion Bone Region Mild depletion  Scapular Bone Region No depletion  Dorsal Hand Mild depletion  Patellar Region Mild depletion  Anterior Thigh Region No depletion  Posterior Calf Region No depletion  Edema (RD Assessment) None  Hair Reviewed  Eyes Reviewed  Mouth Reviewed  Skin Reviewed  Nails Reviewed     Diet Order:   Diet Order            Diet Heart Room service appropriate? Yes; Fluid consistency: Thin  Diet effective now                EDUCATION NEEDS:   Education needs have been addressed  Skin:  Skin Assessment: Reviewed RN Assessment (ecchymosis)  Last BM:  2/24  Height:   Ht Readings from Last 1 Encounters:  06/18/20 5' 2" (1.575 m)    Weight:   Wt Readings from Last 1 Encounters:  06/18/20 67.8 kg    Ideal Body Weight:  50 kg  BMI:  Body mass index is 27.34 kg/m.  Estimated Nutritional Needs:   Kcal:  1500-1700kcal/day  Protein:  75-85g/day  Fluid:  1.3-1.5L/day    MS, RD,   LDN Please refer to AMION for RD and/or RD on-call/weekend/after hours pager  

## 2020-06-19 DIAGNOSIS — J189 Pneumonia, unspecified organism: Secondary | ICD-10-CM | POA: Diagnosis not present

## 2020-06-19 LAB — CBC WITH DIFFERENTIAL/PLATELET
Abs Immature Granulocytes: 0.09 10*3/uL — ABNORMAL HIGH (ref 0.00–0.07)
Basophils Absolute: 0 10*3/uL (ref 0.0–0.1)
Basophils Relative: 0 %
Eosinophils Absolute: 0.2 10*3/uL (ref 0.0–0.5)
Eosinophils Relative: 1 %
HCT: 40.1 % (ref 36.0–46.0)
Hemoglobin: 12.3 g/dL (ref 12.0–15.0)
Immature Granulocytes: 1 %
Lymphocytes Relative: 10 %
Lymphs Abs: 1.1 10*3/uL (ref 0.7–4.0)
MCH: 26.9 pg (ref 26.0–34.0)
MCHC: 30.7 g/dL (ref 30.0–36.0)
MCV: 87.7 fL (ref 80.0–100.0)
Monocytes Absolute: 1.5 10*3/uL — ABNORMAL HIGH (ref 0.1–1.0)
Monocytes Relative: 14 %
Neutro Abs: 7.7 10*3/uL (ref 1.7–7.7)
Neutrophils Relative %: 74 %
Platelets: 259 10*3/uL (ref 150–400)
RBC: 4.57 MIL/uL (ref 3.87–5.11)
RDW: 21.1 % — ABNORMAL HIGH (ref 11.5–15.5)
Smear Review: NORMAL
WBC: 10.6 10*3/uL — ABNORMAL HIGH (ref 4.0–10.5)
nRBC: 0 % (ref 0.0–0.2)

## 2020-06-19 LAB — BASIC METABOLIC PANEL
Anion gap: 9 (ref 5–15)
BUN: 15 mg/dL (ref 8–23)
CO2: 22 mmol/L (ref 22–32)
Calcium: 8.6 mg/dL — ABNORMAL LOW (ref 8.9–10.3)
Chloride: 105 mmol/L (ref 98–111)
Creatinine, Ser: 1.12 mg/dL — ABNORMAL HIGH (ref 0.44–1.00)
GFR, Estimated: 50 mL/min — ABNORMAL LOW (ref >60)
Glucose, Bld: 106 mg/dL — ABNORMAL HIGH (ref 70–99)
Potassium: 3.8 mmol/L (ref 3.5–5.1)
Sodium: 136 mmol/L (ref 135–145)

## 2020-06-19 LAB — URINE CULTURE: Culture: NO GROWTH

## 2020-06-19 MED ORDER — CHLORHEXIDINE GLUCONATE CLOTH 2 % EX PADS
6.0000 | MEDICATED_PAD | Freq: Every day | CUTANEOUS | Status: DC
  Administered 2020-06-19: 6 via TOPICAL

## 2020-06-19 MED ORDER — GUAIFENESIN ER 600 MG PO TB12
600.0000 mg | ORAL_TABLET | Freq: Two times a day (BID) | ORAL | Status: DC
  Administered 2020-06-19 – 2020-06-20 (×3): 600 mg via ORAL
  Filled 2020-06-19 (×3): qty 1

## 2020-06-19 MED ORDER — ACETAMINOPHEN 325 MG PO TABS
650.0000 mg | ORAL_TABLET | Freq: Four times a day (QID) | ORAL | Status: DC | PRN
  Administered 2020-06-19 – 2020-06-20 (×2): 650 mg via ORAL
  Filled 2020-06-19 (×2): qty 2

## 2020-06-19 MED ORDER — GUAIFENESIN-DM 100-10 MG/5ML PO SYRP
5.0000 mL | ORAL_SOLUTION | ORAL | Status: DC | PRN
  Administered 2020-06-19 – 2020-06-20 (×3): 5 mL via ORAL
  Filled 2020-06-19 (×3): qty 5

## 2020-06-19 MED ORDER — OXYCODONE HCL 5 MG PO TABS: 5.0000 mg | ORAL_TABLET | ORAL | Status: DC | PRN

## 2020-06-19 NOTE — Progress Notes (Signed)
PROGRESS NOTE    Krista Cisneros  PQA:[id number] DOB: [birth year] DOA: 06/17/2020 PCP: Sharyne Peach, MD   Brief Narrative:  80 y.o. female with medical history significant for COPD, CAD with history of PCI to LCx and LAD, diastolic heart failure last EF 50 to 55% June 2021, previously EF in the 52s with ischemic cardiomyopathy, as well as history of breast cancer, CKD 3a, HTN and history of Covid December 2021 from which she had completely recovered, who presents with a 2-week history of a cough now with right-sided pleuritic chest pain on coughing and a 1 day history of fever.  It is associated with generalized weakness to where she has been staying in bed, due to difficulty standing up.  When trying to get out of bed on the day of arrival but slid to the floor, not sustaining any injury.  She has  mild shortness of breath but no worse than her baseline for her COPD.    Chest imaging significant for patchy consolidation in the right upper and right lower lobe consistent with bronchopneumonia.  Patient started on intravenous Levaquin.  Respiratory status slowly improving.  Patient on room air however endorsing significant cough.  Assessment & Plan:   Active Problems:   Essential hypertension   COPD (chronic obstructive pulmonary disease) (HCC)   Coronary artery disease involving native coronary artery of native heart without angina pectoris   Ischemic cardiomyopathy   CAP (community acquired pneumonia)   Generalized weakness   History of COVID-19, December 2021   History of breast cancer   Chronic diastolic CHF (congestive heart failure) ([place])   Stage 3a chronic kidney disease ([place])  Community-acquired pneumonia Sepsis secondary to above Sepsis criteria met with fever, tachycardia, tachypnea, leukocytosis Likely source of sepsis pneumonia Patient hemodynamically stable Currently on room air Plan: Continue IV Levaquin DC IV fluids as patient tolerating p.o. Supplemental oxygen  if necessary Monitor fever curve Follow cultures Antitussives Possible discharge in 24 hours  Essential hypertension Continue home Vasotec  COPD (chronic obstructive pulmonary disease) Continue Advair and albuterol inhalers  Coronary artery disease involving native coronary artery of native heart without angina pectoris History of PCI to LCx and LAD Continue aspirin, clopidogrel, rosuvastatin, NTG as needed    Ischemic cardiomyopathy    Chronic diastolic CHF (congestive heart failure) (HC Appears euvolemic Last EF 50 to 55% 09/2019.  Previously had systolic failure with EF 30 to 35% stage 3a chronic kidney disease (HCC)Continue enalapril, carvedilol, spironolactone, furosemideMonitor daily weights in view of IV hydration   History of breast cancer -continue Femara   DVT prophylaxis: SQ Lovenox Code Status: Full Family Communication: Bing Plume [phone] on 2/25 Disposition Plan: Status is: Inpatient  Remains inpatient appropriate because:Inpatient level of care appropriate due to severity of illness   Dispo: The patient is from: Home              Anticipated d/c is to: Home              Patient currently is not medically stable to d/c.   Difficult to place patient No  Patient with community-acquired pneumonia.  Anticipate 48 hours prior to disposition.      Level of care: Med-Surg  Consultants:   None  Procedures:   None  Antimicrobials:   Levaquin    Subjective: Community-acquired pneumonia.  Anticipate 24 hours prior to discharge.  Anticipate discharge home with home health services.   Objective: Vitals:   06/18/20 2008 06/18/20 2349 06/19/20 0458 06/19/20  1039  BP: 139/63 (!) 155/71 132/64 134/63  Pulse: 92 87 84 86  Resp: _0 Temp: 98.8 F (37.1 C) 99.3 F (37.4 C) 98.7 F (37.1 C) 97.9 F (36.6 C)  TempSrc: Oral Oral Oral   SpO2: 93% 91% 91% 91%  Weight:      Height:        Intake/Output Summary (Last 24  hours) at 06/19/2020 1054 Last data filed at 06/19/2020 1040 Gross per 24 hour  Intake 1104.33 ml  Output 700 ml  Net 404.33 ml   Filed Weights   06/17/20 1905 06/18/20 0101  Weight: 63.5 kg 67.8 kg    Examination:  General exam: Appears calm and comfortable  Respiratory system: Scattered crackles bilaterally, greater on right.  Air entry equal.  Normal work of breathing.  Room air Cardiovascular system: S1 & S2 heard, RRR. No JVD, murmurs, rubs, gallops or clicks. No pedal edema. Gastrointestinal system: Abdomen is nondistended, soft and nontender. No organomegaly or masses felt. Normal bowel sounds heard. Central nervous system: Alert and oriented. No focal neurological deficits. Extremities: Symmetric 5 x 5 power. Skin: No rashes, lesions or ulcers Psychiatry: Judgement and insight appear normal. Mood & affect appropriate.     Data Reviewed: I have personally reviewed following labs and imaging studies  CBC: Recent Labs  Lab 06/17/20 1916 06/19/20 0728  WBC 12.5* 10.6*  NEUTROABS 9.2* 7.7  HGB 13.7 12.3  HCT 43.1 40.1  MCV 86.7 87.7  PLT 254 035   Basic Metabolic Panel: Recent Labs  Lab 06/17/20 1916 06/19/20 0728  NA 135 136  K 4.4 3.8  CL 104 105  CO2 21* 22  GLUCOSE 102* 106*  BUN 17 15  CREATININE 1.21* 1.12*  CALCIUM 8.6* 8.6*   GFR: Estimated Creatinine Clearance: 36.8 mL/min (A) (by C-G formula based on SCr of 1.12 mg/dL (H)). Liver Function Tests: Recent Labs  Lab 06/17/20 1916  AST 22  ALT 10  ALKPHOS 46  BILITOT 1.3*  PROT 6.9  ALBUMIN 3.2*   No results for input(s): LIPASE, AMYLASE in the last 168 hours. No results for input(s): AMMONIA in the last 168 hours. Coagulation Profile: No results for input(s): INR, PROTIME in the last 168 hours. Cardiac Enzymes: No results for input(s): CKTOTAL, CKMB, CKMBINDEX, TROPONINI in the last 168 hours. BNP (last 3 results) No results for input(s): PROBNP in the last 8760 hours. HbA1C: No  results for input(s): HGBA1C in the last 72 hours. CBG: No results for input(s): GLUCAP in the last 168 hours. Lipid Profile: No results for input(s): CHOL, HDL, LDLCALC, TRIG, CHOLHDL, LDLDIRECT in the last 72 hours. Thyroid Function Tests: Recent Labs    06/17/20 1916  TSH 3.566   Anemia Panel: No results for input(s): VITAMINB12, FOLATE, FERRITIN, TIBC, IRON, RETICCTPCT in the last 72 hours. Sepsis Labs: Recent Labs  Lab 06/17/20 1916 06/17/20 1921 06/18/20 0644  PROCALCITON 0.22  --   --   LATICACIDVEN  --  1.5 0.8    Recent Results (from the past 240 hour(s))  Blood culture (routine x 2)     Status: None (Preliminary result)   Collection Time: 06/17/20  7:30 PM   Specimen: BLOOD  Result Value Ref Range Status   Specimen Description BLOOD BLOOD LEFT FOREARM  Final   Special Requests   Final    BOTTLES DRAWN AEROBIC AND ANAEROBIC Blood Culture adequate volume   Culture   Final    NO GROWTH 2 DAYS Performed  at [facility], [address], [place], [place] [address]    Report Status PENDING  Incomplete  Resp Panel by RT-PCR (Flu A&B, Covid) Nasopharyngeal Swab     Status: None   Collection Time: 06/17/20  8:00 PM   Specimen: Nasopharyngeal Swab; Nasopharyngeal(NP) swabs in vial transport medium  Result Value Ref Range Status   SARS Coronavirus 2 by RT PCR NEGATIVE NEGATIVE Final    Comment: (NOTE) SARS-CoV-2 target nucleic acids are NOT DETECTED.  The SARS-CoV-2 RNA is generally detectable in upper respiratory specimens during the acute phase of infection. The lowest concentration of SARS-CoV-2 viral copies this assay can detect is 138 copies/mL. A negative result does not preclude SARS-Cov-2 infection and should not be used as the sole basis for treatment or other patient management decisions. A negative result may occur with  improper specimen collection/handling, submission of specimen other than nasopharyngeal swab, presence of viral mutation(s)  within the areas targeted by this assay, and inadequate number of viral copies(<138 copies/mL). A negative result must be combined with clinical observations, patient history, and epidemiological information. The expected result is Negative.  Fact Sheet for Patients:  EntrepreneurPulse.com.au  Fact Sheet for Healthcare Providers:  IncredibleEmployment.be  This test is no t yet approved or cleared by the [place] FDA and  has been authorized for detection and/or diagnosis of SARS-CoV-2 by FDA under an Emergency Use Authorization (EUA). This EUA will remain  in effect (meaning this test can be used) for the duration of the COVID-19 declaration under Section 564(b)(1) of the Act, 21 U.S.C.section 360bbb-3(b)(1), unless the authorization is terminated  or revoked sooner.       Influenza A by PCR NEGATIVE NEGATIVE Final   Influenza B by PCR NEGATIVE NEGATIVE Final    Comment: (NOTE) The Xpert Xpress SARS-CoV-2/FLU/RSV plus assay is intended as an aid in the diagnosis of influenza from Nasopharyngeal swab specimens and should not be used as a sole basis for treatment. Nasal washings and aspirates are unacceptable for Xpert Xpress SARS-CoV-2/FLU/RSV testing.  Fact Sheet for Patients: EntrepreneurPulse.com.au  Fact Sheet for Healthcare Providers: IncredibleEmployment.be  This test is not yet approved or cleared by the [place] FDA and has been authorized for detection and/or diagnosis of SARS-CoV-2 by FDA under an Emergency Use Authorization (EUA). This EUA will remain in effect (meaning this test can be used) for the duration of the COVID-19 declaration under Section 564(b)(1) of the Act, 21 U.S.C. section 360bbb-3(b)(1), unless the authorization is terminated or revoked.  Performed at [facility], [address], [place], [place] [address]   Urine culture     Status: None   Collection  Time: 06/17/20  9:40 PM   Specimen: Urine, Random  Result Value Ref Range Status   Specimen Description   Final    URINE, RANDOM Performed at [facility], [address] [place], [place] [address]    Special Requests   Final    NONE Performed at [facility], [address], [place], [place] [address]    Culture   Final    NO GROWTH Performed at [facility], [place] [address] [place], [place] [address]    Report Status 06/19/2020 FINAL  Final  Blood culture (routine x 2)     Status: None (Preliminary result)   Collection Time: 06/17/20  9:50 PM   Specimen: BLOOD  Result Value Ref Range Status   Specimen Description BLOOD BLOOD LEFT HAND  Final   Special  Requests   Final    BOTTLES DRAWN AEROBIC AND ANAEROBIC Blood Culture adequate volume   Culture   Final    NO GROWTH 2 DAYS Performed at [facility], [address] [place], [place] [address]    Report Status PENDING  Incomplete         Radiology Studies: DG Chest 1 View  Result Date: 06/17/2020 CLINICAL DATA:  Weakness EXAM: CHEST  1 VIEW COMPARISON:  10/07/2019 and prior. FINDINGS: Left chest wall Port-A-Cath tip overlies the distal SVC. Right thoracic surgical clips. No pneumothorax or pleural effusion. Emphysematous changes with stable crowding of the lung base markings. New nodular right mid to lower lung opacities. Cardiomediastinal silhouette within normal limits. No acute osseous abnormality. IMPRESSION: New right lung nodular opacities. Differential includes infectious/inflammatory foci however cannot exclude alternative pathologies. Consider chest CT for better characterisation. Electronically Signed   By: Primitivo Gauze M.D.   On: 06/17/2020 19:51   CT Angio Chest PE W and/or Wo Contrast  Result Date: 06/17/2020 CLINICAL DATA:  Shortness of breath, generalized weakness, history of right breast cancer EXAM: CT ANGIOGRAPHY CHEST WITH CONTRAST TECHNIQUE: Multidetector CT  imaging of the chest was performed using the standard protocol during bolus administration of intravenous contrast. Multiplanar CT image reconstructions and MIPs were obtained to evaluate the vascular anatomy. CONTRAST:  50m OMNIPAQUE IOHEXOL 350 MG/ML SOLN COMPARISON:  06/17/2020, 04/09/2018 FINDINGS: Cardiovascular: This is a technically adequate evaluation of the pulmonary vasculature. No filling defects or pulmonary emboli. The heart is unremarkable without pericardial effusion. Dense atherosclerosis throughout the coronary vasculature. Normal caliber of the thoracic aorta. Moderate atherosclerosis of the aortic arch and descending thoracic aorta. Mediastinum/Nodes: Borderline enlarged pretracheal lymph node measuring 10 mm image 25/4, previously having measured 6 mm. No other pathologic adenopathy. Thyroid gland, trachea, and esophagus demonstrate no significant findings. Moderate hiatal hernia. Lungs/Pleura: Severe centrilobular emphysema, upper lobe predominant. Focal areas of consolidation are seen within the right upper and right lower lobes, which could reflect bronchopneumonia. No effusion or pneumothorax. Central airways are patent. Upper Abdomen: No acute abnormality. Musculoskeletal: No acute or destructive bony lesions. Postsurgical changes are seen within the right breast and axilla. Reconstructed images demonstrate no additional findings. Review of the MIP images confirms the above findings. IMPRESSION: 1. Patchy consolidation within the right upper and right lower lobe, consistent with bronchopneumonia. Followup PA and lateral chest X-ray is recommended in 3-4 weeks following trial of antibiotic therapy to ensure resolution and exclude underlying malignancy. 2. Borderline enlarged pretracheal lymph node, which could be reactive. Continued follow-up may be warranted given patient's history of breast cancer. 3. Severe upper lobe predominant centrilobular emphysema. 4. Moderate hiatal hernia. 5.  Aortic Atherosclerosis (ICD10-I70.0). Coronary artery atherosclerosis. Electronically Signed   By: MRanda NgoM.D.   On: 06/17/2020 20:49        Scheduled Meds: . baclofen  10 mg Oral QHS  . carvedilol  6.25 mg Oral BID  . Chlorhexidine Gluconate Cloth  6 each Topical Daily  . cholecalciferol  1,000 Units Oral Daily  . clopidogrel  75 mg Oral Q breakfast  . DULoxetine  90 mg Oral Daily  . enoxaparin (LOVENOX) injection  40 mg Subcutaneous Q24H  . feeding supplement  237 mL Oral BID BM  . fluticasone  2 spray Each Nare Daily  . guaiFENesin  600 mg Oral BID  . letrozole  2.5 mg Oral Daily  . mometasone-formoterol  2 puff Inhalation BID  . montelukast  10 mg Oral  QHS  . multivitamin with minerals  1 tablet Oral Daily  . pantoprazole  40 mg Oral Daily  . rosuvastatin  20 mg Oral Daily  . spironolactone  12.5 mg Oral Daily   Continuous Infusions: . levofloxacin (LEVAQUIN) IV       LOS: 2 days    Time spent: 15 minutes    Sidney Ace, MD [facility] Pager [phone]  If 7PM-7AM, please contact night-coverage 06/19/2020, 10:54 AM

## 2020-06-20 DIAGNOSIS — J189 Pneumonia, unspecified organism: Secondary | ICD-10-CM | POA: Diagnosis not present

## 2020-06-20 MED ORDER — GUAIFENESIN ER 600 MG PO TB12: 600.0000 mg | ORAL_TABLET | Freq: Two times a day (BID) | ORAL | 0 refills | Status: AC

## 2020-06-20 MED ORDER — LEVOFLOXACIN 750 MG PO TABS: 750.0000 mg | ORAL_TABLET | Freq: Every day | ORAL | 0 refills | Status: AC

## 2020-06-20 MED ORDER — GUAIFENESIN-DM 100-10 MG/5ML PO SYRP: 5.0000 mL | ORAL_SOLUTION | ORAL | 0 refills | Status: DC | PRN

## 2020-06-20 NOTE — Progress Notes (Signed)
Pt given d/c education. Pt had all questions answered. IV removed. Assisted with getting dressed and collecting belongings. Awaiting ride.

## 2020-06-20 NOTE — TOC Transition Note (Signed)
Transition of Care [facility]) - CM/SW Discharge Note   Patient Details  Name: Krista Cisneros MRN: [id number] Date of Birth: 01-21-1941  Transition of Care [facility]) CM/SW Contact:  Boris Sharper, LCSW Phone Number: 06/20/2020, 2:45 PM   Clinical Narrative:    Pt is medically stable for discharge per MD. Pt will be transported home by her son in law. CSW notified Malachy Mood with [facility] of discharge, [facility] to follow for [facility] PT, OT and RN.    Final next level of care: Home w Home Health Services Barriers to Discharge: No Barriers Identified   Patient Goals and CMS Choice        Discharge Placement                Patient to be transferred to facility by: son Name of family member notified: Gerald Stabs Patient and family notified of of transfer: 06/20/20  Discharge Plan and Services     Post Acute Care Choice: [place]: PT,OT,RN [facility] Agency: [place] Date [facility] Agency Contacted: 06/20/20 Time [place]: Accident Representative spoke with at [place]: [place] ([place]) Interventions     Readmission Risk Interventions Readmission Risk Prevention Plan 10/09/2019  Transportation Screening Complete  Medication Review Press photographer) Complete  [place] Not Applicable  Some recent data might be hidden

## 2020-06-20 NOTE — Plan of Care (Signed)

## 2020-06-20 NOTE — Discharge Summary (Signed)
Physician Discharge Summary  Krista Cisneros NOI:[id number] DOB: 10-03-1940 DOA: 06/17/2020  PCP: Sharyne Peach, MD  Admit date: 06/17/2020 Discharge date: 06/20/2020  Admitted From: Home Disposition:  Home  Recommendations for Outpatient Follow-up:  1. Follow up with PCP in 1-2 weeks 2.   Home Health: Yes Equipment/Devices: None Discharge Condition: Stable CODE STATUS: Full Diet recommendation: Heart Healthy   Brief/Interim Summary:  80 y.o.femalewith medical history significant forCOPD, CAD with history of PCI to LCx and LAD, diastolic heart failure last EF 50 to 55% June 2021, previously EF in the 15s with ischemic cardiomyopathy, as well as history of breast cancer, CKD 3a, HTN and history of Covid December 2019fom which she had completely recovered, who presents with a2-weekhistory of a cough now with right-sided pleuritic chest pain on coughing and a 1 day history of fever. It is associated with generalized weakness to where she has been staying in bed, due to difficulty standing up.When tryingto get out of bed on the day of arrival but slid to the floor, not sustaining any injury. She has mild shortness of breathbut no worse than her baseline for her COPD.   Chest imaging significant for patchy consolidation in the right upper and right lower lobe consistent with bronchopneumonia.  Patient started on intravenous Levaquin.  Respiratory status slowly improving.  Patient on room air however endorsing significant cough.  On day of discharge cough markedly improved.  Patient ambulated with nursing staff around unit.  Did not desaturate beyond 89%.  Does not meet criteria for home oxygen.  Will discharge home at this time.  Remainder of prescription for Levaquin provided at time of discharge.  As needed antitussives also prescribed.  Discharge Diagnoses:  Active Problems:   Essential hypertension   COPD (chronic obstructive pulmonary disease) (HCC)   Coronary  artery disease involving native coronary artery of native heart without angina pectoris   Ischemic cardiomyopathy   CAP (community acquired pneumonia)   Generalized weakness   History of COVID-19, December 2021   History of breast cancer   Chronic diastolic CHF (congestive heart failure) ([facility]   Stage 3a chronic kidney disease ([facility]  Community-acquired pneumonia Sepsis secondary to above Sepsis criteria met with fever, tachycardia, tachypnea, leukocytosis Likely source of sepsis pneumonia Patient hemodynamically stable Currently on room air Sepsis physiology resolved the time of discharge IV Levaquin switched to p.o. Complete empiric 7-day course Ambulated around unit with nursing staff Did not desaturate, does not meet criteria from oxygen Follow-up PCP 1 week  Essential hypertension Continue home Vasotec  COPD (chronic obstructive pulmonary disease) Continue Advair and albuterol inhalers  Coronary artery disease involving native coronary artery of native heart without angina pectoris History of PCI to LCx and LAD Continue aspirin, clopidogrel, rosuvastatin, NTG as needed  Ischemic cardiomyopathy Chronic diastolic CHF (congestive heart failure) ([facility] Appears euvolemic Last EF 50 to 55% 09/2019. Previously had systolic failure with EF 30 to 35% stage 3a chronic kidney disease (HCC)Continue enalapril, carvedilol, spironolactone, furosemide  History of breast cancer -continue Femara  Discharge Instructions  Discharge Instructions    Diet - low sodium heart healthy   Complete by: As directed    Increase activity slowly   Complete by: As directed      Allergies as of 06/20/2020      Reactions   Nsaids    Bleeding risk   Penicillins Anaphylaxis, Swelling, Rash   Has patient had a PCN reaction causing immediate rash, facial/tongue/throat swelling, SOB or lightheadedness with hypotension: Yes  Has patient had a PCN reaction causing severe rash involving mucus  membranes or skin necrosis: No Has patient had a PCN reaction that required hospitalization: No  Has patient had a PCN reaction occurring within the last 10 years: No If all of the above answers are "NO", then may proceed with Cephalosporin use.   Tamsulosin Nausea Only   Atorvastatin Other (See Comments)   Muscle Pain   Gabapentin Swelling   Ezetimibe Other (See Comments)   Muscle pain   Aspirin    GI bleeding risk      Medication List    STOP taking these medications   Calcium-Vitamin D-Vitamin K 750-500-40 MG-UNT-MCG Tabs   Klor-Con M20 20 MEQ tablet Generic drug: potassium chloride SA   Melatonin-Pyridoxine 5-1 MG Tabs     TAKE these medications   Advair Diskus 500-50 MCG/DOSE Aepb Generic drug: Fluticasone-Salmeterol Inhale 1 puff into the lungs 2 (two) times daily.   albuterol 108 (90 Base) MCG/ACT inhaler Commonly known as: VENTOLIN HFA Inhale 2 puffs into the lungs every 6 (six) hours as needed for wheezing or shortness of breath.   aspirin EC 81 MG tablet Take 81 mg by mouth daily.   baclofen 10 MG tablet Commonly known as: LIORESAL Take 10 mg by mouth at bedtime.   carvedilol 3.125 MG tablet Commonly known as: COREG Take 2 tablets (6.25 mg total) by mouth 2 (two) times daily.   cholecalciferol 25 MCG (1000 UNIT) tablet Commonly known as: VITAMIN D3 Take 1,000 Units by mouth daily.   clopidogrel 75 MG tablet Commonly known as: PLAVIX Take 1 tablet (75 mg total) by mouth daily with breakfast.   Coenzyme Q10 300 MG Caps Take 300 mg by mouth daily.   DULoxetine 60 MG capsule Commonly known as: CYMBALTA Take 60 mg by mouth daily. Takes with 39m for a total of 977m  DULoxetine 30 MG capsule Commonly known as: CYMBALTA Take 30 mg by mouth every morning. Takes with 60 mg for a total of 9041m enalapril 5 MG tablet Commonly known as: Vasotec Take 1 tablet (5 mg total) by mouth daily.   fluticasone 50 MCG/ACT nasal spray Commonly known as:  FLONASE Place 2 sprays into the nose daily.   furosemide 20 MG tablet Commonly known as: Lasix Take 1 tablet (20 mg total) by mouth as needed for edema.   guaiFENesin 600 MG 12 hr tablet Commonly known as: MUCINEX Take 1 tablet (600 mg total) by mouth 2 (two) times daily for 7 days.   guaiFENesin-dextromethorphan 100-10 MG/5ML syrup Commonly known as: ROBITUSSIN DM Take 5 mLs by mouth every 4 (four) hours as needed for cough.   letrozole 2.5 MG tablet Commonly known as: FEMARA TAKE 1 TABLET BY MOUTH EVERY DAY   levofloxacin 750 MG tablet Commonly known as: Levaquin Take 1 tablet (750 mg total) by mouth daily for 4 days.   lidocaine-prilocaine cream Commonly known as: EMLA Apply 1 application as needed topically (for port access).   magnesium oxide 400 MG tablet Commonly known as: MAG-OX Take 400 mg by mouth daily.   melatonin 5 MG Tabs Take 10 mg by mouth at bedtime as needed (sleep).   montelukast 10 MG tablet Commonly known as: SINGULAIR Take 10 mg by mouth at bedtime.   nitroGLYCERIN 0.4 MG SL tablet Commonly known as: NITROSTAT Place 1 tablet (0.4 mg total) under the tongue every 5 (five) minutes as needed for chest pain.   pantoprazole 40 MG tablet Commonly known  as: PROTONIX TAKE 1 TABLET BY MOUTH EVERY DAY   rosuvastatin 20 MG tablet Commonly known as: CRESTOR TAKE 1 TABLET BY MOUTH EVERY DAY AT 6PM   spironolactone 25 MG tablet Commonly known as: ALDACTONE Take 0.5 tablets (12.5 mg total) by mouth daily.       Follow-up Information    [facility] Follow up.   Why: They will follow up with you for your home health needs. Contact information: [place] [address] [phone]              Allergies  Allergen Reactions  . Nsaids     Bleeding risk  . Penicillins Anaphylaxis, Swelling and Rash    Has patient had a PCN reaction causing immediate rash, facial/tongue/throat swelling, SOB or lightheadedness  with hypotension: Yes Has patient had a PCN reaction causing severe rash involving mucus membranes or skin necrosis: No Has patient had a PCN reaction that required hospitalization: No  Has patient had a PCN reaction occurring within the last 10 years: No If all of the above answers are "NO", then may proceed with Cephalosporin use.   . Tamsulosin Nausea Only  . Atorvastatin Other (See Comments)    Muscle Pain  . Gabapentin Swelling  . Ezetimibe Other (See Comments)    Muscle pain  . Aspirin     GI bleeding risk    Consultations:  None   Procedures/Studies: DG Chest 1 View  Result Date: 06/17/2020 CLINICAL DATA:  Weakness EXAM: CHEST  1 VIEW COMPARISON:  10/07/2019 and prior. FINDINGS: Left chest wall Port-A-Cath tip overlies the distal SVC. Right thoracic surgical clips. No pneumothorax or pleural effusion. Emphysematous changes with stable crowding of the lung base markings. New nodular right mid to lower lung opacities. Cardiomediastinal silhouette within normal limits. No acute osseous abnormality. IMPRESSION: New right lung nodular opacities. Differential includes infectious/inflammatory foci however cannot exclude alternative pathologies. Consider chest CT for better characterisation. Electronically Signed   By: Primitivo Gauze M.D.   On: 06/17/2020 19:51   CT Angio Chest PE W and/or Wo Contrast  Result Date: 06/17/2020 CLINICAL DATA:  Shortness of breath, generalized weakness, history of right breast cancer EXAM: CT ANGIOGRAPHY CHEST WITH CONTRAST TECHNIQUE: Multidetector CT imaging of the chest was performed using the standard protocol during bolus administration of intravenous contrast. Multiplanar CT image reconstructions and MIPs were obtained to evaluate the vascular anatomy. CONTRAST:  40m OMNIPAQUE IOHEXOL 350 MG/ML SOLN COMPARISON:  06/17/2020, 04/09/2018 FINDINGS: Cardiovascular: This is a technically adequate evaluation of the pulmonary vasculature. No filling  defects or pulmonary emboli. The heart is unremarkable without pericardial effusion. Dense atherosclerosis throughout the coronary vasculature. Normal caliber of the thoracic aorta. Moderate atherosclerosis of the aortic arch and descending thoracic aorta. Mediastinum/Nodes: Borderline enlarged pretracheal lymph node measuring 10 mm image 25/4, previously having measured 6 mm. No other pathologic adenopathy. Thyroid gland, trachea, and esophagus demonstrate no significant findings. Moderate hiatal hernia. Lungs/Pleura: Severe centrilobular emphysema, upper lobe predominant. Focal areas of consolidation are seen within the right upper and right lower lobes, which could reflect bronchopneumonia. No effusion or pneumothorax. Central airways are patent. Upper Abdomen: No acute abnormality. Musculoskeletal: No acute or destructive bony lesions. Postsurgical changes are seen within the right breast and axilla. Reconstructed images demonstrate no additional findings. Review of the MIP images confirms the above findings. IMPRESSION: 1. Patchy consolidation within the right upper and right lower lobe, consistent with bronchopneumonia. Followup PA and lateral chest X-ray is recommended  in 3-4 weeks following trial of antibiotic therapy to ensure resolution and exclude underlying malignancy. 2. Borderline enlarged pretracheal lymph node, which could be reactive. Continued follow-up may be warranted given patient's history of breast cancer. 3. Severe upper lobe predominant centrilobular emphysema. 4. Moderate hiatal hernia. 5. Aortic Atherosclerosis (ICD10-I70.0). Coronary artery atherosclerosis. Electronically Signed   By: Randa Ngo M.D.   On: 06/17/2020 20:49    (Echo, Carotid, EGD, Colonoscopy, ERCP)    Subjective: Patient seen and examined on day of discharge.  Stable, no distress.  On room air.  Afebrile.  Cough improved.  Stable for discharge home.  Discharge Exam: Vitals:   06/20/20 0820 06/20/20 1218   BP: (!) 144/64 131/60  Pulse: 79 77  Resp: 16 18  Temp: 98.3 F (36.8 C) 98.5 F (36.9 C)  SpO2: 93% 96%   Vitals:   06/20/20 0019 06/20/20 0421 06/20/20 0820 06/20/20 1218  BP: 111/67 (!) 126/53 (!) 144/64 131/60  Pulse: 82 80 79 77  Resp: _0 Temp: 99.4 F (37.4 C) 98.7 F (37.1 C) 98.3 F (36.8 C) 98.5 F (36.9 C)  TempSrc:    Oral  SpO2: 92% 92% 93% 96%  Weight:      Height:        General: Pt is alert, awake, not in acute distress Cardiovascular: RRR, S1/S2 +, no rubs, no gallops Respiratory: Scattered crackles on right side.  Normal work of breathing.  Room air Abdominal: Soft, NT, ND, bowel sounds + Extremities: no edema, no cyanosis    The results of significant diagnostics from this hospitalization (including imaging, microbiology, ancillary and laboratory) are listed below for reference.     Microbiology: Recent Results (from the past 240 hour(s))  Blood culture (routine x 2)     Status: None (Preliminary result)   Collection Time: 06/17/20  7:30 PM   Specimen: BLOOD  Result Value Ref Range Status   Specimen Description BLOOD BLOOD LEFT FOREARM  Final   Special Requests   Final    BOTTLES DRAWN AEROBIC AND ANAEROBIC Blood Culture adequate volume   Culture   Final    NO GROWTH 3 DAYS Performed at [facility], [address] [place], [place] [address]    Report Status PENDING  Incomplete  Resp Panel by RT-PCR (Flu A&B, Covid) Nasopharyngeal Swab     Status: None   Collection Time: 06/17/20  8:00 PM   Specimen: Nasopharyngeal Swab; Nasopharyngeal(NP) swabs in vial transport medium  Result Value Ref Range Status   SARS Coronavirus 2 by RT PCR NEGATIVE NEGATIVE Final    Comment: (NOTE) SARS-CoV-2 target nucleic acids are NOT DETECTED.  The SARS-CoV-2 RNA is generally detectable in upper respiratory specimens during the acute phase of infection. The lowest concentration of SARS-CoV-2 viral copies this assay can detect is 138  copies/mL. A negative result does not preclude SARS-Cov-2 infection and should not be used as the sole basis for treatment or other patient management decisions. A negative result may occur with  improper specimen collection/handling, submission of specimen other than nasopharyngeal swab, presence of viral mutation(s) within the areas targeted by this assay, and inadequate number of viral copies(<138 copies/mL). A negative result must be combined with clinical observations, patient history, and epidemiological information. The expected result is Negative.  Fact Sheet for Patients:  EntrepreneurPulse.com.au  Fact Sheet for Healthcare Providers:  IncredibleEmployment.be  This test is no t yet approved or cleared by the [place] FDA and  has  been authorized for detection and/or diagnosis of SARS-CoV-2 by FDA under an Emergency Use Authorization (EUA). This EUA will remain  in effect (meaning this test can be used) for the duration of the COVID-19 declaration under Section 564(b)(1) of the Act, 21 U.S.C.section 360bbb-3(b)(1), unless the authorization is terminated  or revoked sooner.       Influenza A by PCR NEGATIVE NEGATIVE Final   Influenza B by PCR NEGATIVE NEGATIVE Final    Comment: (NOTE) The Xpert Xpress SARS-CoV-2/FLU/RSV plus assay is intended as an aid in the diagnosis of influenza from Nasopharyngeal swab specimens and should not be used as a sole basis for treatment. Nasal washings and aspirates are unacceptable for Xpert Xpress SARS-CoV-2/FLU/RSV testing.  Fact Sheet for Patients: EntrepreneurPulse.com.au  Fact Sheet for Healthcare Providers: IncredibleEmployment.be  This test is not yet approved or cleared by the [place] FDA and has been authorized for detection and/or diagnosis of SARS-CoV-2 by FDA under an Emergency Use Authorization (EUA). This EUA will remain in effect (meaning  this test can be used) for the duration of the COVID-19 declaration under Section 564(b)(1) of the Act, 21 U.S.C. section 360bbb-3(b)(1), unless the authorization is terminated or revoked.  Performed at [facility], [address] [place], [place] [address]   Urine culture     Status: None   Collection Time: 06/17/20  9:40 PM   Specimen: Urine, Random  Result Value Ref Range Status   Specimen Description   Final    URINE, RANDOM Performed at [facility], [address], [place], [place] [address]    Special Requests   Final    NONE Performed at [facility], [address], [place], [place] [address]    Culture   Final    NO GROWTH Performed at [facility], [place] [address] [place], [place] [address]    Report Status 06/19/2020 FINAL  Final  Blood culture (routine x 2)     Status: None (Preliminary result)   Collection Time: 06/17/20  9:50 PM   Specimen: BLOOD  Result Value Ref Range Status   Specimen Description BLOOD BLOOD LEFT HAND  Final   Special Requests   Final    BOTTLES DRAWN AEROBIC AND ANAEROBIC Blood Culture adequate volume   Culture   Final    NO GROWTH 3 DAYS Performed at [facility], [address], [place], [place] [address]    Report Status PENDING  Incomplete     Labs: BNP (last 3 results) No results for input(s): BNP in the last 8760 hours. Basic Metabolic Panel: Recent Labs  Lab 06/17/20 1916 06/19/20 0728  NA 135 136  K 4.4 3.8  CL 104 105  CO2 21* 22  GLUCOSE 102* 106*  BUN 17 15  CREATININE 1.21* 1.12*  CALCIUM 8.6* 8.6*   Liver Function Tests: Recent Labs  Lab 06/17/20 1916  AST 22  ALT 10  ALKPHOS 46  BILITOT 1.3*  PROT 6.9  ALBUMIN 3.2*   No results for input(s): LIPASE, AMYLASE in the last 168 hours. No results for input(s): AMMONIA in the last 168 hours. CBC: Recent Labs  Lab 06/17/20 1916 06/19/20 0728  WBC 12.5* 10.6*  NEUTROABS 9.2* 7.7  HGB 13.7 12.3  HCT 43.1  40.1  MCV 86.7 87.7  PLT 254 259   Cardiac Enzymes: No results for input(s): CKTOTAL, CKMB, CKMBINDEX, TROPONINI in the last 168 hours. BNP: Invalid input(s): POCBNP CBG: No results for input(s): GLUCAP in the  last 168 hours. D-Dimer No results for input(s): DDIMER in the last 72 hours. Hgb A1c No results for input(s): HGBA1C in the last 72 hours. Lipid Profile No results for input(s): CHOL, HDL, LDLCALC, TRIG, CHOLHDL, LDLDIRECT in the last 72 hours. Thyroid function studies Recent Labs    06/17/20 1916  TSH 3.566   Anemia work up No results for input(s): VITAMINB12, FOLATE, FERRITIN, TIBC, IRON, RETICCTPCT in the last 72 hours. Urinalysis    Component Value Date/Time   COLORURINE YELLOW (A) 06/17/2020 2140   APPEARANCEUR CLEAR (A) 06/17/2020 2140   LABSPEC 1.029 06/17/2020 2140   PHURINE 5.0 06/17/2020 2140   GLUCOSEU NEGATIVE 06/17/2020 2140   HGBUR NEGATIVE 06/17/2020 2140   BILIRUBINUR NEGATIVE 06/17/2020 2140   KETONESUR 5 (A) 06/17/2020 2140   PROTEINUR NEGATIVE 06/17/2020 2140   NITRITE NEGATIVE 06/17/2020 2140   LEUKOCYTESUR NEGATIVE 06/17/2020 2140   Sepsis Labs Invalid input(s): PROCALCITONIN,  WBC,  LACTICIDVEN Microbiology Recent Results (from the past 240 hour(s))  Blood culture (routine x 2)     Status: None (Preliminary result)   Collection Time: 06/17/20  7:30 PM   Specimen: BLOOD  Result Value Ref Range Status   Specimen Description BLOOD BLOOD LEFT FOREARM  Final   Special Requests   Final    BOTTLES DRAWN AEROBIC AND ANAEROBIC Blood Culture adequate volume   Culture   Final    NO GROWTH 3 DAYS Performed at [facility], [address] [place], [place] [address]    Report Status PENDING  Incomplete  Resp Panel by RT-PCR (Flu A&B, Covid) Nasopharyngeal Swab     Status: None   Collection Time: 06/17/20  8:00 PM   Specimen: Nasopharyngeal Swab; Nasopharyngeal(NP) swabs in vial transport medium  Result Value Ref Range Status   SARS  Coronavirus 2 by RT PCR NEGATIVE NEGATIVE Final    Comment: (NOTE) SARS-CoV-2 target nucleic acids are NOT DETECTED.  The SARS-CoV-2 RNA is generally detectable in upper respiratory specimens during the acute phase of infection. The lowest concentration of SARS-CoV-2 viral copies this assay can detect is 138 copies/mL. A negative result does not preclude SARS-Cov-2 infection and should not be used as the sole basis for treatment or other patient management decisions. A negative result may occur with  improper specimen collection/handling, submission of specimen other than nasopharyngeal swab, presence of viral mutation(s) within the areas targeted by this assay, and inadequate number of viral copies(<138 copies/mL). A negative result must be combined with clinical observations, patient history, and epidemiological information. The expected result is Negative.  Fact Sheet for Patients:  EntrepreneurPulse.com.au  Fact Sheet for Healthcare Providers:  IncredibleEmployment.be  This test is no t yet approved or cleared by the [place] FDA and  has been authorized for detection and/or diagnosis of SARS-CoV-2 by FDA under an Emergency Use Authorization (EUA). This EUA will remain  in effect (meaning this test can be used) for the duration of the COVID-19 declaration under Section 564(b)(1) of the Act, 21 U.S.C.section 360bbb-3(b)(1), unless the authorization is terminated  or revoked sooner.       Influenza A by PCR NEGATIVE NEGATIVE Final   Influenza B by PCR NEGATIVE NEGATIVE Final    Comment: (NOTE) The Xpert Xpress SARS-CoV-2/FLU/RSV plus assay is intended as an aid in the diagnosis of influenza from Nasopharyngeal swab specimens and should not be used as a sole basis for treatment. Nasal washings and aspirates are unacceptable for Xpert Xpress SARS-CoV-2/FLU/RSV testing.  Fact Sheet for  Patients: EntrepreneurPulse.com.au  Fact Sheet for Healthcare Providers: IncredibleEmployment.be  This test is not yet approved or cleared by the [place] FDA and has been authorized for detection and/or diagnosis of SARS-CoV-2 by FDA under an Emergency Use Authorization (EUA). This EUA will remain in effect (meaning this test can be used) for the duration of the COVID-19 declaration under Section 564(b)(1) of the Act, 21 U.S.C. section 360bbb-3(b)(1), unless the authorization is terminated or revoked.  Performed at [facility], [address], [place], [place] [address]   Urine culture     Status: None   Collection Time: 06/17/20  9:40 PM   Specimen: Urine, Random  Result Value Ref Range Status   Specimen Description   Final    URINE, RANDOM Performed at [facility], [address], [place], [place] [address]    Special Requests   Final    NONE Performed at [facility], [address] [place], [place] [address]    Culture   Final    NO GROWTH Performed at [facility], [place] [address], [place], [place] [address]    Report Status 06/19/2020 FINAL  Final  Blood culture (routine x 2)     Status: None (Preliminary result)   Collection Time: 06/17/20  9:50 PM   Specimen: BLOOD  Result Value Ref Range Status   Specimen Description BLOOD BLOOD LEFT HAND  Final   Special Requests   Final    BOTTLES DRAWN AEROBIC AND ANAEROBIC Blood Culture adequate volume   Culture   Final    NO GROWTH 3 DAYS Performed at [facility], [address], [place],  [address]    Report Status PENDING  Incomplete     Time coordinating discharge: Over 30 minutes  SIGNED:   Sidney Ace, MD  [facility] 06/20/2020, 2:25 PM Pager   If 7PM-7AM, please contact night-coverage

## 2020-06-20 NOTE — Discharge Instructions (Signed)
Community-Acquired Pneumonia, Adult Pneumonia is an infection of the lungs. It causes irritation and swelling in the airways of the lungs. Mucus and fluid may also build up inside the airways. This may cause coughing and trouble breathing. One type of pneumonia can happen while you are in a hospital. A different type can happen when you are not in a hospital (community-acquired pneumonia). What are the causes? This condition is caused by germs (viruses, bacteria, or fungi). Some types of germs can spread from person to person. Pneumonia is not thought to spread from person to person.   What increases the risk? You are more likely to develop this condition if:  You have a long-term (chronic) disease, such as: ? Disease of the lungs. This may be chronic obstructive pulmonary disease (COPD) or asthma. ? Heart failure. ? Cystic fibrosis. ? Diabetes. ? Kidney disease. ? Sickle cell disease. ? HIV.  You have other health problems, such as: ? Your body's defense system (immune system) is weak. ? A condition that may cause you to breathe in fluids from your mouth and nose.  You had your spleen taken out.  You do not take good care of your teeth and mouth (poor dental hygiene).  You use or have used tobacco products.  You travel where the germs that cause this illness are common.  You are near certain animals or the places they live.  You are older than 79 years of age. What are the signs or symptoms? Symptoms of this condition include:  A cough.  A fever.  Sweating or chills.  Chest pain, often when you breathe deeply or cough.  Breathing problems, such as: ? Fast breathing. ? Trouble breathing. ? Shortness of breath.  Feeling tired (fatigued).  Muscle aches. How is this treated? Treatment for this condition depends on many things, such as:  The cause of your illness.  Your medicines.  Your other health problems. Most adults can be treated at home. Sometimes,  treatment must happen in a hospital.  Treatment may include medicines to kill germs.  Medicines may depend on which germ caused your illness. Very bad pneumonia is rare. If you get it, you may:  Have a machine to help you breathe.  Have fluid taken away from around your lungs. Follow these instructions at home: Medicines  Take over-the-counter and prescription medicines only as told by your doctor.  Take cough medicine only if you are losing sleep. Cough medicine can keep your body from taking mucus away from your lungs.  If you were prescribed an antibiotic medicine, take it as told by your doctor. Do not stop taking the antibiotic even if you start to feel better. Lifestyle  Do not drink alcohol.  Do not use any products that contain nicotine or tobacco, such as cigarettes, e-cigarettes, and chewing tobacco. If you need help quitting, ask your doctor.  Eat a healthy diet. This includes a lot of vegetables, fruits, whole grains, low-fat dairy products, and low-fat (lean) protein.      General instructions  Rest a lot. Sleep for at least 8 hours each night.  Sleep with your head and neck raised. Put a few pillows under your head or sleep in a reclining chair.  Return to your normal activities as told by your doctor. Ask your doctor what activities are safe for you.  Drink enough fluid to keep your pee (urine) pale yellow.  If your throat is sore, rinse your mouth often with salt water. To make salt   water, dissolve -1 tsp (3-6 g) of salt in 1 cup (237 mL) of warm water.  Keep all follow-up visits as told by your doctor. This is important.   How is this prevented? You can lower your risk of pneumonia by:  Getting the pneumonia shot (vaccine). These shots have different types and schedules. Ask your doctor what works best for you. Think about getting this shot if: ? You are older than 79 years of age. ? You are 19-65 years of age and:  You are being treated for  cancer.  You have long-term lung disease.  You have other problems that affect your body's defense system. Ask your doctor if you have one of these.  Getting your flu shot every year. Ask your doctor which type of shot is best for you.  Going to the dentist as often as told.  Washing your hands often with soap and water for at least 20 seconds. If you cannot use soap and water, use hand sanitizer. Contact a doctor if:  You have a fever.  You lose sleep because your cough medicine does not help. Get help right away if:  You are short of breath and this gets worse.  You have more chest pain.  Your sickness gets worse. This is very serious if: ? You are an older adult. ? Your body's defense system is weak.  You cough up blood. These symptoms may be an emergency. Do not wait to see if the symptoms will go away. Get medical help right away. Call your local emergency services (911 in the [place] Do not drive yourself to the hospital. Summary  Pneumonia is an infection of the lungs.  Community-acquired pneumonia affects people who have not been in the hospital. Certain germs can cause this infection.  This condition may be treated with medicines that kill germs.  For very bad pneumonia, you may need a hospital stay and treatment to help with breathing. This information is not intended to replace advice given to you by your health care provider. Make sure you discuss any questions you have with your health care provider. Document Revised: 01/21/2019 Document Reviewed: 01/21/2019 [employer] Patient Education  2021 [employer]   Community-Acquired Pneumonia, Adult Pneumonia is an infection of the lungs. It causes irritation and swelling in the airways of the lungs. Mucus and fluid may also build up inside the airways. This may cause coughing and trouble breathing. One type of pneumonia can happen while you are in a hospital. A different type can happen when you are not in a hospital  (community-acquired pneumonia). What are the causes? This condition is caused by germs (viruses, bacteria, or fungi). Some types of germs can spread from person to person. Pneumonia is not thought to spread from person to person.   What increases the risk? You are more likely to develop this condition if:  You have a long-term (chronic) disease, such as: ? Disease of the lungs. This may be chronic obstructive pulmonary disease (COPD) or asthma. ? Heart failure. ? Cystic fibrosis. ? Diabetes. ? Kidney disease. ? Sickle cell disease. ? HIV.  You have other health problems, such as: ? Your body's defense system (immune system) is weak. ? A condition that may cause you to breathe in fluids from your mouth and nose.  You had your spleen taken out.  You do not take good care of your teeth and mouth (poor dental hygiene).  You use or have used tobacco products.  You travel where   the germs that cause this illness are common.  You are near certain animals or the places they live.  You are older than 79 years of age. What are the signs or symptoms? Symptoms of this condition include:  A cough.  A fever.  Sweating or chills.  Chest pain, often when you breathe deeply or cough.  Breathing problems, such as: ? Fast breathing. ? Trouble breathing. ? Shortness of breath.  Feeling tired (fatigued).  Muscle aches. How is this treated? Treatment for this condition depends on many things, such as:  The cause of your illness.  Your medicines.  Your other health problems. Most adults can be treated at home. Sometimes, treatment must happen in a hospital.  Treatment may include medicines to kill germs.  Medicines may depend on which germ caused your illness. Very bad pneumonia is rare. If you get it, you may:  Have a machine to help you breathe.  Have fluid taken away from around your lungs. Follow these instructions at home: Medicines  Take over-the-counter and  prescription medicines only as told by your doctor.  Take cough medicine only if you are losing sleep. Cough medicine can keep your body from taking mucus away from your lungs.  If you were prescribed an antibiotic medicine, take it as told by your doctor. Do not stop taking the antibiotic even if you start to feel better. Lifestyle  Do not drink alcohol.  Do not use any products that contain nicotine or tobacco, such as cigarettes, e-cigarettes, and chewing tobacco. If you need help quitting, ask your doctor.  Eat a healthy diet. This includes a lot of vegetables, fruits, whole grains, low-fat dairy products, and low-fat (lean) protein.      General instructions  Rest a lot. Sleep for at least 8 hours each night.  Sleep with your head and neck raised. Put a few pillows under your head or sleep in a reclining chair.  Return to your normal activities as told by your doctor. Ask your doctor what activities are safe for you.  Drink enough fluid to keep your pee (urine) pale yellow.  If your throat is sore, rinse your mouth often with salt water. To make salt water, dissolve -1 tsp (3-6 g) of salt in 1 cup (237 mL) of warm water.  Keep all follow-up visits as told by your doctor. This is important.   How is this prevented? You can lower your risk of pneumonia by:  Getting the pneumonia shot (vaccine). These shots have different types and schedules. Ask your doctor what works best for you. Think about getting this shot if: ? You are older than 79 years of age. ? You are 19-65 years of age and:  You are being treated for cancer.  You have long-term lung disease.  You have other problems that affect your body's defense system. Ask your doctor if you have one of these.  Getting your flu shot every year. Ask your doctor which type of shot is best for you.  Going to the dentist as often as told.  Washing your hands often with soap and water for at least 20 seconds. If you cannot  use soap and water, use hand sanitizer. Contact a doctor if:  You have a fever.  You lose sleep because your cough medicine does not help. Get help right away if:  You are short of breath and this gets worse.  You have more chest pain.  Your sickness gets worse. This is very   serious if: ? You are an older adult. ? Your body's defense system is weak.  You cough up blood. These symptoms may be an emergency. Do not wait to see if the symptoms will go away. Get medical help right away. Call your local emergency services (911 in the [place] Do not drive yourself to the hospital. Summary  Pneumonia is an infection of the lungs.  Community-acquired pneumonia affects people who have not been in the hospital. Certain germs can cause this infection.  This condition may be treated with medicines that kill germs.  For very bad pneumonia, you may need a hospital stay and treatment to help with breathing. This information is not intended to replace advice given to you by your health care provider. Make sure you discuss any questions you have with your health care provider. Document Revised: 01/21/2019 Document Reviewed: 01/21/2019 Elsevier Patient Education  2021 [employer]  

## 2020-06-22 ENCOUNTER — Inpatient Hospital Stay: Payer: Medicare Other | Attending: Oncology

## 2020-06-22 LAB — CULTURE, BLOOD (ROUTINE X 2)
Culture: NO GROWTH
Culture: NO GROWTH
Special Requests: ADEQUATE
Special Requests: ADEQUATE

## 2020-07-02 ENCOUNTER — Other Ambulatory Visit: Payer: Self-pay

## 2020-07-02 ENCOUNTER — Inpatient Hospital Stay: Payer: Medicare Other | Attending: Oncology

## 2020-07-02 ENCOUNTER — Ambulatory Visit
Admission: RE | Admit: 2020-07-02 | Discharge: 2020-07-02 | Disposition: A | Discharge status: Home / Self Care | Payer: Medicare Other | Source: Ambulatory Visit | Attending: Oncology | Admitting: Oncology

## 2020-07-02 DIAGNOSIS — Z452 Encounter for adjustment and management of vascular access device: Secondary | ICD-10-CM | POA: Diagnosis not present

## 2020-07-02 DIAGNOSIS — Z171 Estrogen receptor negative status [ER-]: Secondary | ICD-10-CM | POA: Insufficient documentation

## 2020-07-02 DIAGNOSIS — C50411 Malignant neoplasm of upper-outer quadrant of right female breast: Secondary | ICD-10-CM | POA: Insufficient documentation

## 2020-07-02 DIAGNOSIS — Z95828 Presence of other vascular implants and grafts: Secondary | ICD-10-CM

## 2020-07-02 IMAGING — MG MM DIGITAL DIAGNOSTIC UNILAT*R*
8 of 11 series · 8 of 27 positions shown · non-contrast
Comparison: Previous exam(s).
COMPARISON: Previous exam(s).

Addendum:
CLINICAL DATA: 80-year-old female for six-month follow-up of LEFT
breast calcifications. History of LEFT breast cancer and lumpectomy
in [EF].

EXAM:
DIGITAL DIAGNOSTIC UNILATERAL RIGHT MAMMOGRAM
TECHNIQUE: Right digital diagnostic mammography was performed. Mammographic
images were processed with CAD.

[R MLO]
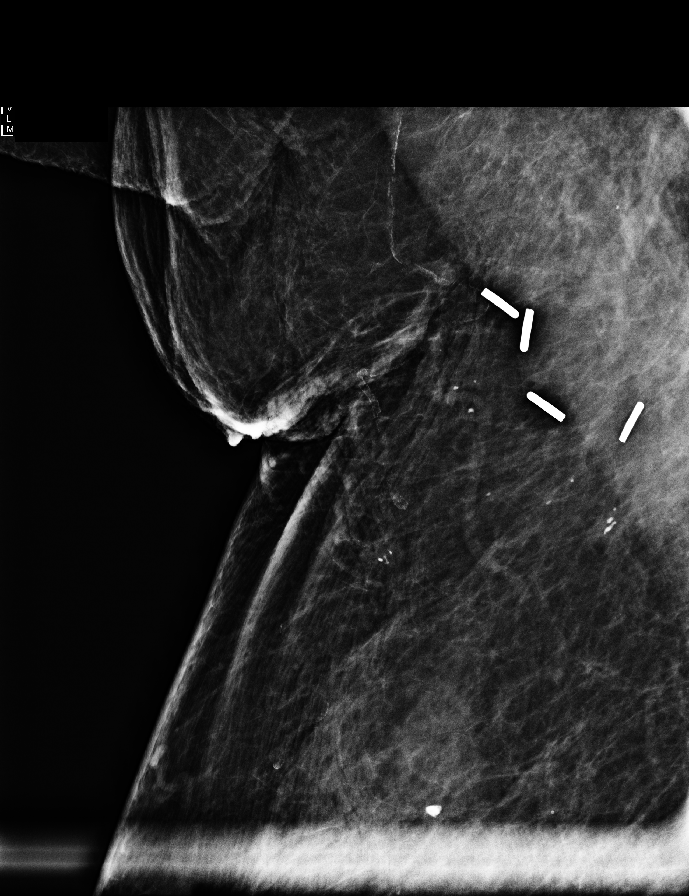

[R XCCL (1 of 2)]
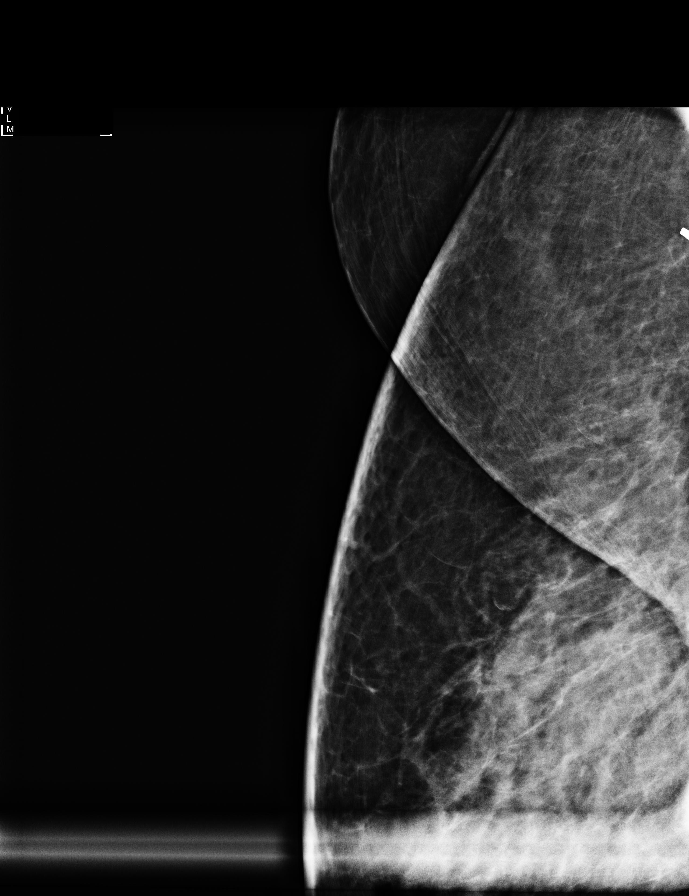

[R XCCL (2 of 2)]
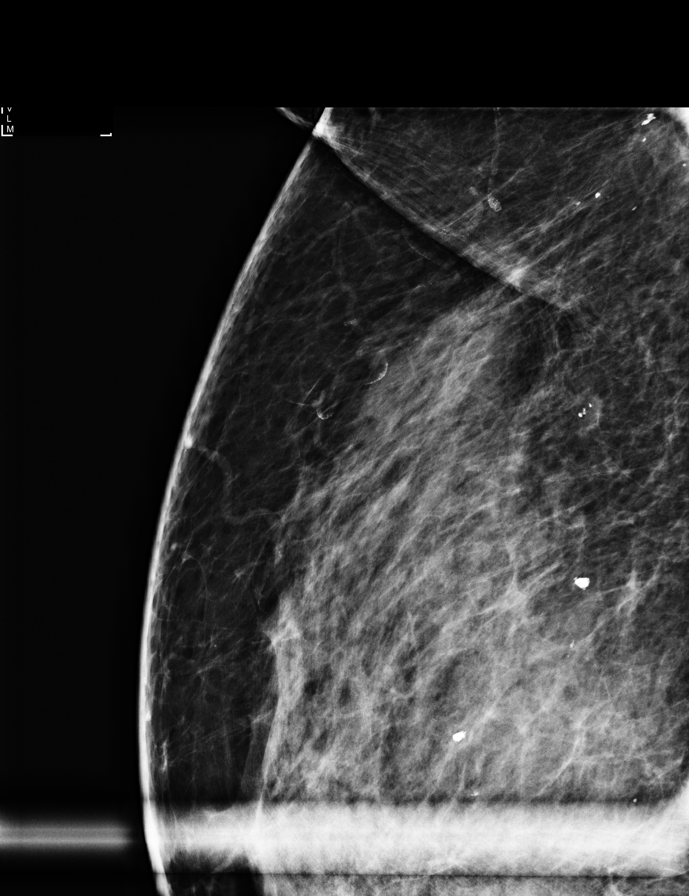

[R CC synth-2D]
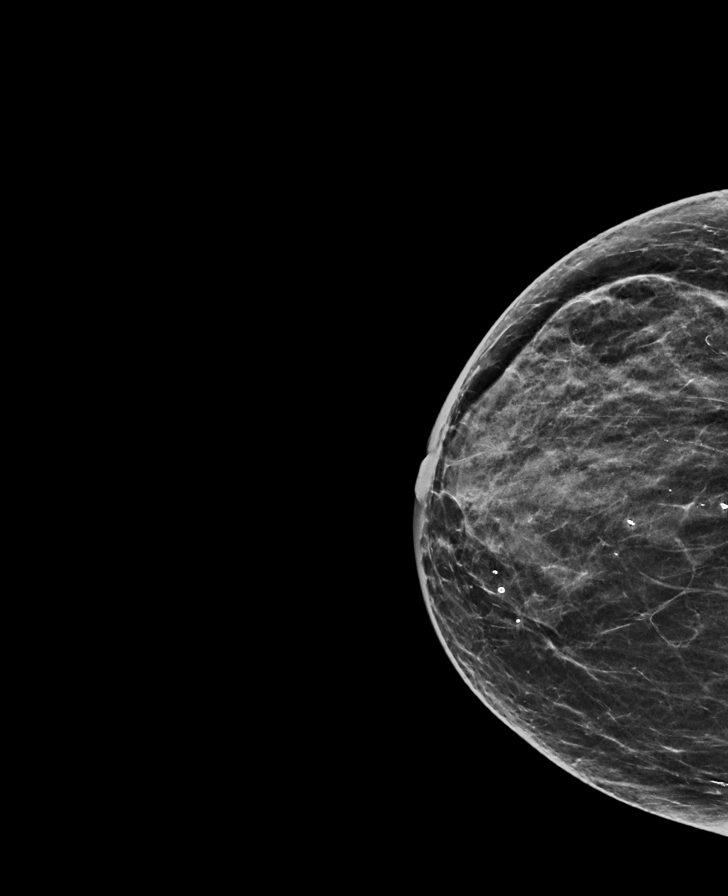

[R MLO synth-2D (1 of 3)]
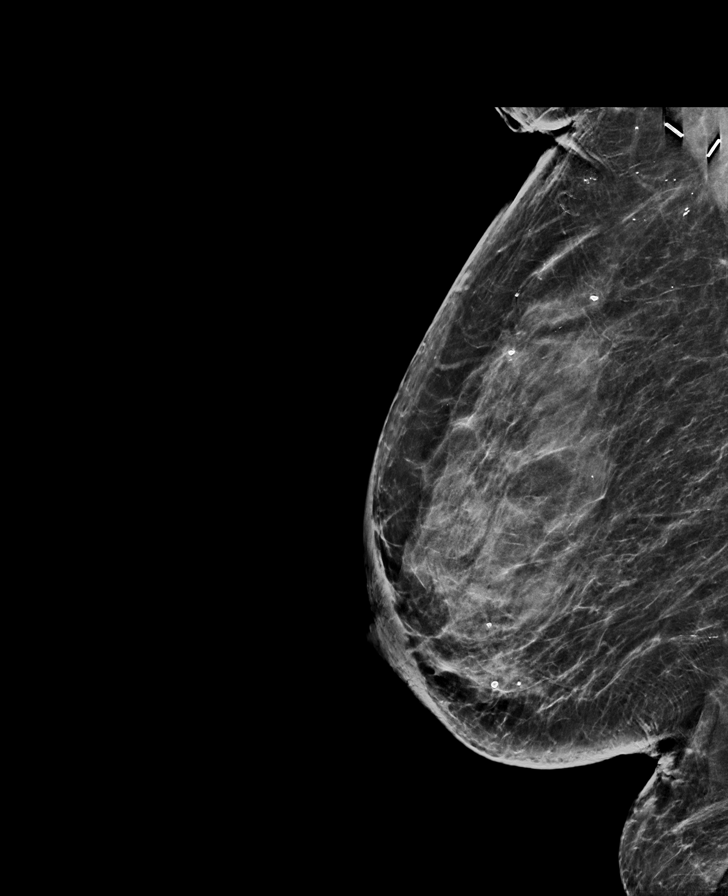

[R MLO synth-2D (2 of 3)]
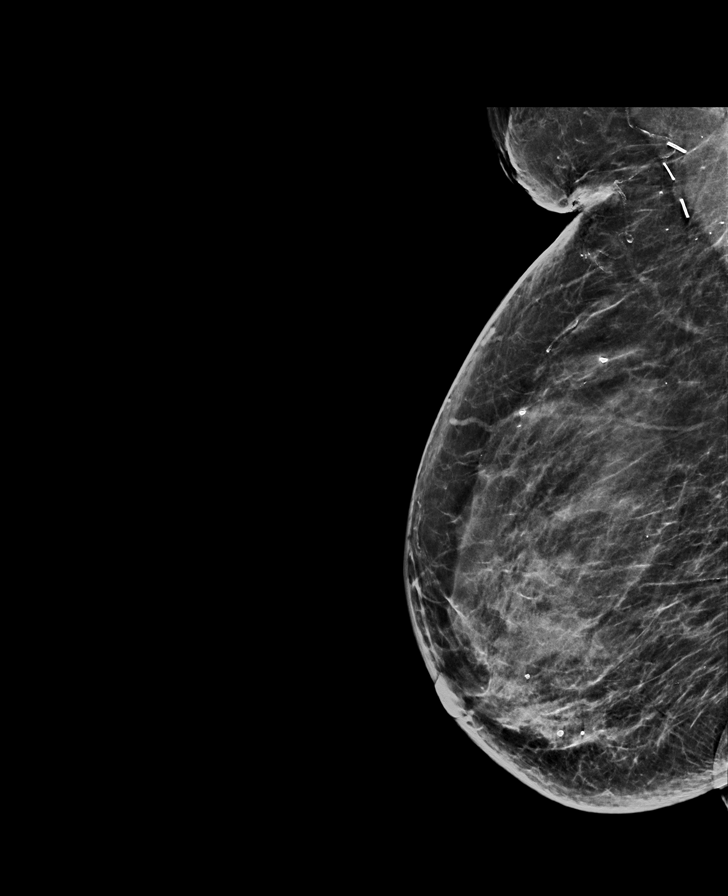

[R MLO synth-2D (3 of 3)]
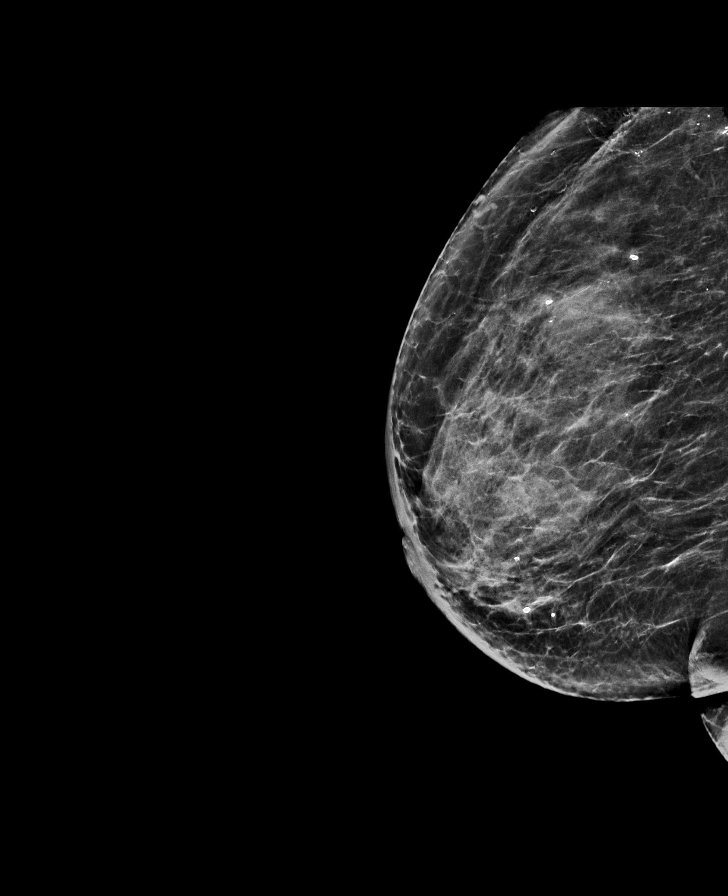

[R MLO tomo · tomo slice 33/64.0]
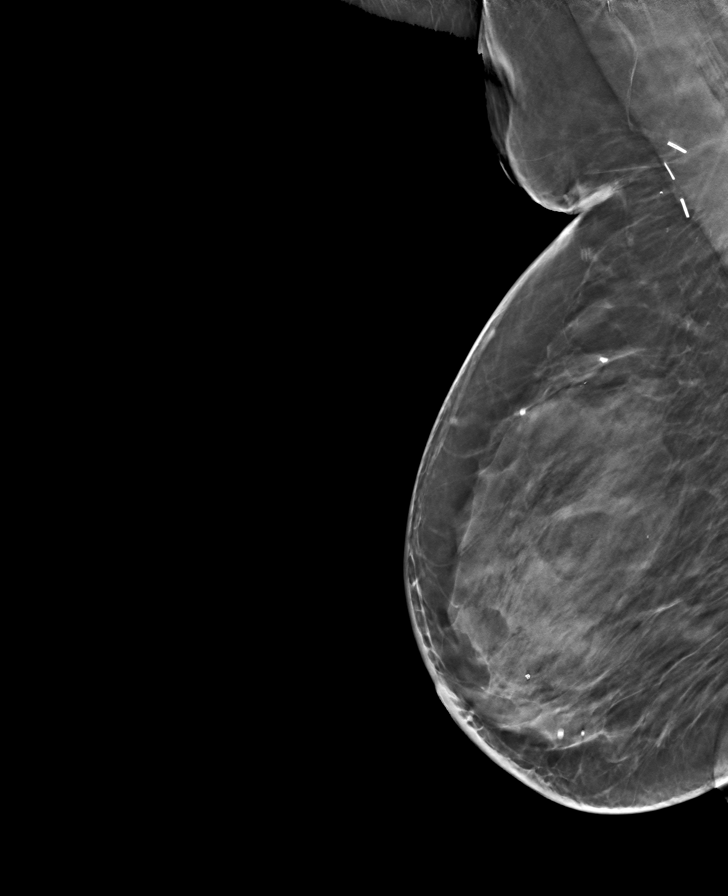

[8 of 27 positions shown; findings below may reference images not displayed]

ACR Breast Density Category c: The breast tissue is heterogeneously
dense, which may obscure small masses.
FINDINGS: Full field and magnification views of the RIGHT breast demonstrate a
few scattered coarse calcifications anteroinferior to the surgical
clips in the RIGHT axilla.

No new findings are noted.
IMPRESSION: Little significant change in likely benign calcifications within the
high UPPER-OUTER RIGHT breast/RIGHT axilla. Six-month follow-up
recommended to ensure 1 year stability.

RECOMMENDATION:
Bilateral diagnostic mammogram with magnification views of the RIGHT
breast in 6 months.

I have discussed the findings and recommendations with the patient.
If applicable, a reminder letter will be sent to the patient
regarding the next appointment.

BI-RADS CATEGORY  3: Probably benign.

ADDENDUM:
CLINICAL DATA should read:

History of RIGHT breast cancer and lumpectomy in [EF].

*** End of Addendum ***
ACR Breast Density Category c: The breast tissue is heterogeneously
dense, which may obscure small masses.
FINDINGS: Full field and magnification views of the RIGHT breast demonstrate a
few scattered coarse calcifications anteroinferior to the surgical
clips in the RIGHT axilla.

No new findings are noted.
IMPRESSION: Little significant change in likely benign calcifications within the
high UPPER-OUTER RIGHT breast/RIGHT axilla. Six-month follow-up
recommended to ensure 1 year stability.

RECOMMENDATION:
Bilateral diagnostic mammogram with magnification views of the RIGHT
breast in 6 months.

I have discussed the findings and recommendations with the patient.
If applicable, a reminder letter will be sent to the patient
regarding the next appointment.

BI-RADS CATEGORY  3: Probably benign.

## 2020-07-02 MED ORDER — SODIUM CHLORIDE 0.9% FLUSH
10.0000 mL | Freq: Once | INTRAVENOUS | Status: AC
  Administered 2020-07-02: 10 mL via INTRAVENOUS
  Filled 2020-07-02: qty 10

## 2020-07-02 MED ORDER — HEPARIN SOD (PORK) LOCK FLUSH 100 UNIT/ML IV SOLN
INTRAVENOUS | Status: AC
  Filled 2020-07-02: qty 5

## 2020-07-02 MED ORDER — HEPARIN SOD (PORK) LOCK FLUSH 100 UNIT/ML IV SOLN
500.0000 [IU] | Freq: Once | INTRAVENOUS | Status: AC
  Administered 2020-07-02: 500 [IU] via INTRAVENOUS
  Filled 2020-07-02: qty 5

## 2020-08-10 ENCOUNTER — Inpatient Hospital Stay: Payer: Medicare Other | Attending: Oncology

## 2020-08-10 DIAGNOSIS — Z171 Estrogen receptor negative status [ER-]: Secondary | ICD-10-CM

## 2020-08-10 DIAGNOSIS — C50411 Malignant neoplasm of upper-outer quadrant of right female breast: Secondary | ICD-10-CM | POA: Diagnosis present

## 2020-08-10 DIAGNOSIS — Z452 Encounter for adjustment and management of vascular access device: Secondary | ICD-10-CM | POA: Insufficient documentation

## 2020-08-10 DIAGNOSIS — C50011 Malignant neoplasm of nipple and areola, right female breast: Secondary | ICD-10-CM

## 2020-08-10 DIAGNOSIS — D509 Iron deficiency anemia, unspecified: Secondary | ICD-10-CM

## 2020-08-10 MED ORDER — SODIUM CHLORIDE 0.9% FLUSH
10.0000 mL | Freq: Once | INTRAVENOUS | Status: AC | PRN
  Administered 2020-08-10: 10 mL
  Filled 2020-08-10: qty 10

## 2020-08-10 MED ORDER — HEPARIN SOD (PORK) LOCK FLUSH 100 UNIT/ML IV SOLN
500.0000 [IU] | Freq: Once | INTRAVENOUS | Status: AC | PRN
  Administered 2020-08-10: 500 [IU]
  Filled 2020-08-10: qty 5

## 2020-08-10 MED ORDER — HEPARIN SOD (PORK) LOCK FLUSH 100 UNIT/ML IV SOLN
INTRAVENOUS | Status: AC
  Filled 2020-08-10: qty 5

## 2020-08-18 ENCOUNTER — Other Ambulatory Visit: Payer: Self-pay | Admitting: Family Medicine

## 2020-08-18 ENCOUNTER — Other Ambulatory Visit: Payer: Self-pay

## 2020-08-18 ENCOUNTER — Ambulatory Visit
Admission: RE | Admit: 2020-08-18 | Discharge: 2020-08-18 | Disposition: A | Discharge status: Home / Self Care | Payer: Medicare Other | Source: Ambulatory Visit | Attending: Family Medicine | Admitting: Family Medicine

## 2020-08-18 ENCOUNTER — Ambulatory Visit
Admission: RE | Admit: 2020-08-18 | Discharge: 2020-08-18 | Disposition: A | Discharge status: Home / Self Care | Payer: Medicare Other | Attending: Family Medicine | Admitting: Family Medicine

## 2020-08-18 DIAGNOSIS — J189 Pneumonia, unspecified organism: Secondary | ICD-10-CM

## 2020-08-18 IMAGING — CR DG CHEST 2V
2 series · 2 of 2 positions shown · non-contrast
Comparison: Chest x-ray [DATE].

CLINICAL DATA: 80-year-old female with history of
community-acquired pneumonia. Follow-up study.

EXAM:
CHEST - 2 VIEW

[chest pa]
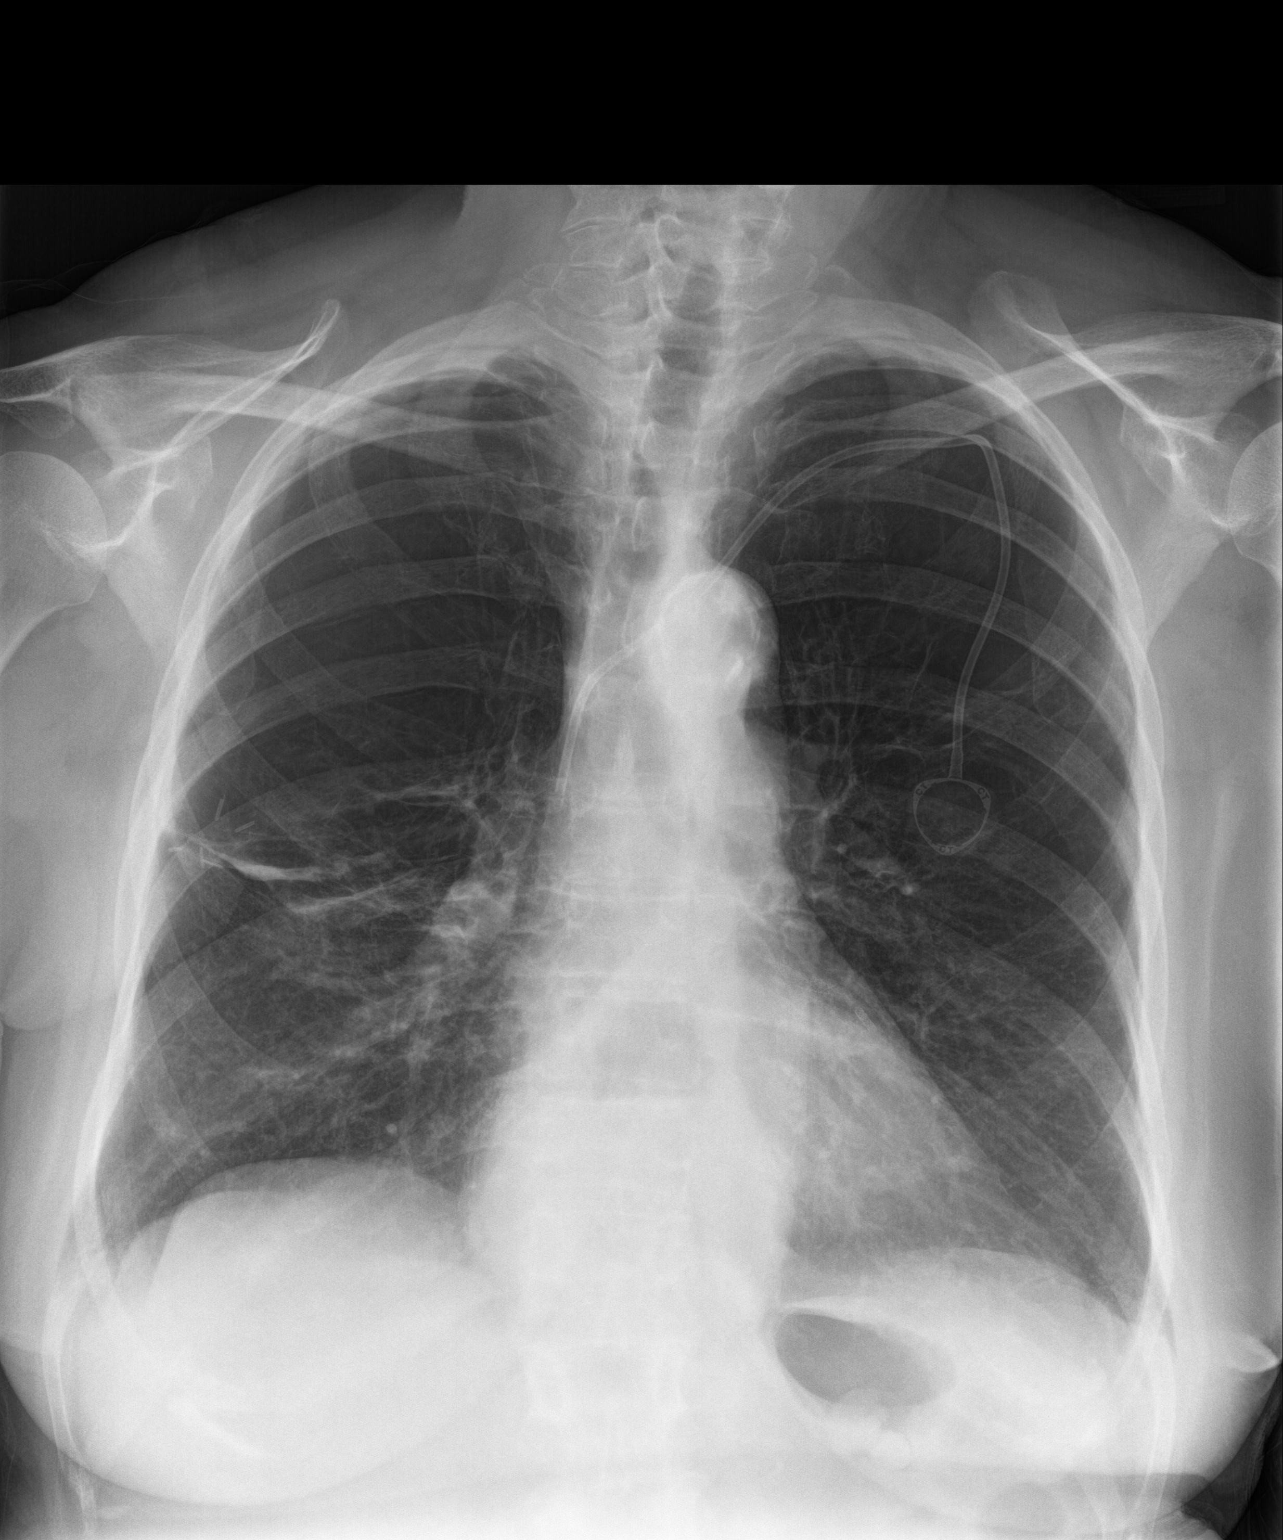

[chest lat]
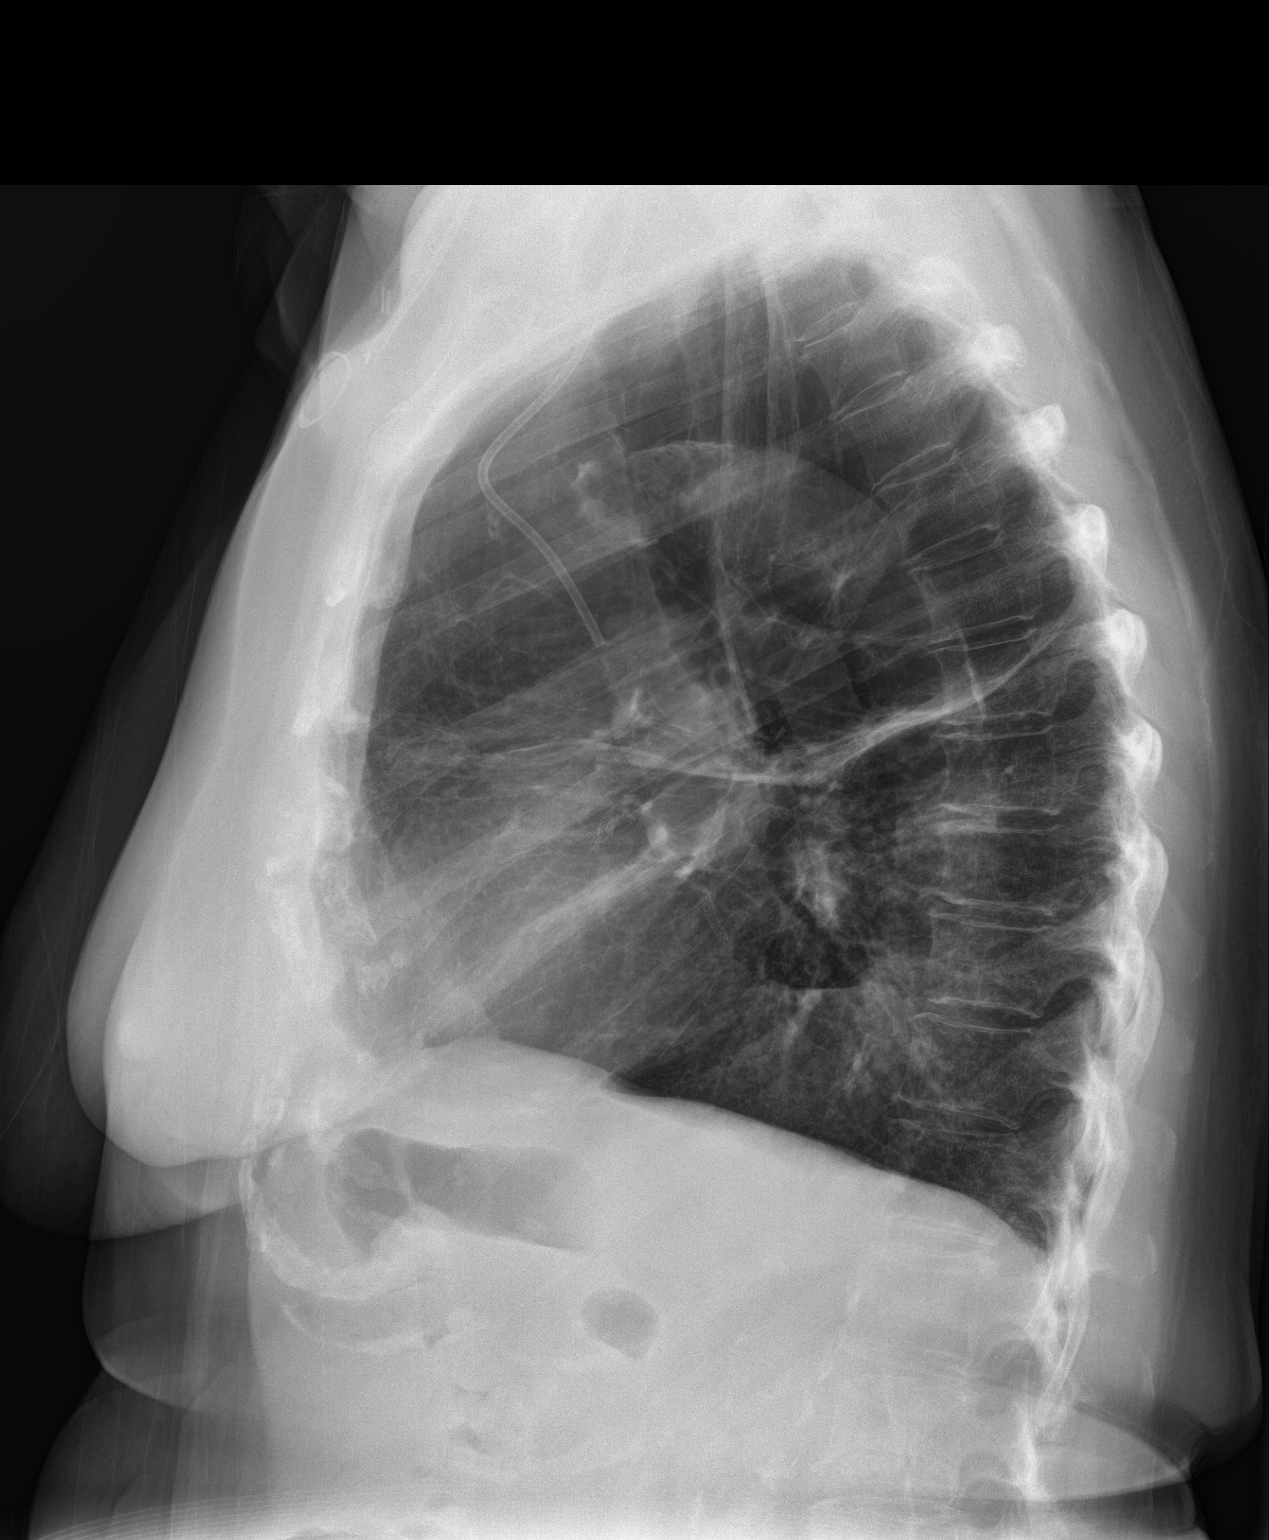

[2 of 2 positions shown; findings below may reference images not displayed]

FINDINGS: Previously noted ill-defined areas of airspace consolidation in the
right lung have largely resolved, with some linear opacities now
noted in the right middle lobe and inferior aspect of the right
upper lobe, likely to reflect evolving post infectious scarring. No
new consolidative airspace disease. No pleural effusions. No
pneumothorax. No evidence of pulmonary edema. Severe emphysematous
changes are noted. Heart size is normal. Large hiatal hernia. Upper
mediastinal contours are within normal limits. Atherosclerotic
calcifications in the thoracic aorta. Left-sided subclavian
single-lumen porta cath with tip terminating in the distal superior
vena cava.
IMPRESSION: 1. Resolving multilobar right-sided pneumonia with evolving post
infectious scarring, as above.
2. Emphysema.
3. Aortic atherosclerosis.
4. Large hiatal hernia.

## 2020-09-01 ENCOUNTER — Encounter: Payer: Self-pay | Admitting: Oncology

## 2020-09-01 ENCOUNTER — Other Ambulatory Visit: Payer: Self-pay | Admitting: *Deleted

## 2020-09-01 DIAGNOSIS — R531 Weakness: Secondary | ICD-10-CM

## 2020-09-02 ENCOUNTER — Inpatient Hospital Stay: Payer: Medicare Other | Attending: Oncology

## 2020-09-02 ENCOUNTER — Other Ambulatory Visit: Payer: Self-pay

## 2020-09-02 DIAGNOSIS — C50411 Malignant neoplasm of upper-outer quadrant of right female breast: Secondary | ICD-10-CM | POA: Insufficient documentation

## 2020-09-02 DIAGNOSIS — E876 Hypokalemia: Secondary | ICD-10-CM | POA: Diagnosis not present

## 2020-09-02 DIAGNOSIS — R531 Weakness: Secondary | ICD-10-CM

## 2020-09-02 DIAGNOSIS — D509 Iron deficiency anemia, unspecified: Secondary | ICD-10-CM | POA: Diagnosis not present

## 2020-09-02 DIAGNOSIS — I509 Heart failure, unspecified: Secondary | ICD-10-CM | POA: Diagnosis not present

## 2020-09-02 DIAGNOSIS — Z95828 Presence of other vascular implants and grafts: Secondary | ICD-10-CM

## 2020-09-02 LAB — CBC WITH DIFFERENTIAL/PLATELET
Abs Immature Granulocytes: 0.28 10*3/uL — ABNORMAL HIGH (ref 0.00–0.07)
Basophils Absolute: 0.1 10*3/uL (ref 0.0–0.1)
Basophils Relative: 0 %
Eosinophils Absolute: 0 10*3/uL (ref 0.0–0.5)
Eosinophils Relative: 0 %
HCT: 40.4 % (ref 36.0–46.0)
Hemoglobin: 12.5 g/dL (ref 12.0–15.0)
Immature Granulocytes: 1 %
Lymphocytes Relative: 9 %
Lymphs Abs: 1.9 10*3/uL (ref 0.7–4.0)
MCH: 26.7 pg (ref 26.0–34.0)
MCHC: 30.9 g/dL (ref 30.0–36.0)
MCV: 86.1 fL (ref 80.0–100.0)
Monocytes Absolute: 1.9 10*3/uL — ABNORMAL HIGH (ref 0.1–1.0)
Monocytes Relative: 9 %
Neutro Abs: 16.3 10*3/uL — ABNORMAL HIGH (ref 1.7–7.7)
Neutrophils Relative %: 81 %
Platelets: 379 10*3/uL (ref 150–400)
RBC: 4.69 MIL/uL (ref 3.87–5.11)
RDW: 15.6 % — ABNORMAL HIGH (ref 11.5–15.5)
WBC: 20.5 10*3/uL — ABNORMAL HIGH (ref 4.0–10.5)
nRBC: 0 % (ref 0.0–0.2)

## 2020-09-02 LAB — COMPREHENSIVE METABOLIC PANEL
ALT: 14 U/L (ref 0–44)
AST: 23 U/L (ref 15–41)
Albumin: 3.2 g/dL — ABNORMAL LOW (ref 3.5–5.0)
Alkaline Phosphatase: 54 U/L (ref 38–126)
Anion gap: 12 (ref 5–15)
BUN: 45 mg/dL — ABNORMAL HIGH (ref 8–23)
CO2: 22 mmol/L (ref 22–32)
Calcium: 9.2 mg/dL (ref 8.9–10.3)
Chloride: 105 mmol/L (ref 98–111)
Creatinine, Ser: 2.03 mg/dL — ABNORMAL HIGH (ref 0.44–1.00)
GFR, Estimated: 25 mL/min — ABNORMAL LOW (ref >60)
Glucose, Bld: 121 mg/dL — ABNORMAL HIGH (ref 70–99)
Potassium: 4.7 mmol/L (ref 3.5–5.1)
Sodium: 139 mmol/L (ref 135–145)
Total Bilirubin: 0.4 mg/dL (ref 0.3–1.2)
Total Protein: 7.8 g/dL (ref 6.5–8.1)

## 2020-09-02 LAB — IRON AND TIBC
Iron: 31 ug/dL (ref 28–170)
Saturation Ratios: 10 % — ABNORMAL LOW (ref 10.4–31.8)
TIBC: 302 ug/dL (ref 250–450)
UIBC: 271 ug/dL

## 2020-09-02 LAB — FERRITIN: Ferritin: 107 ng/mL (ref 11–307)

## 2020-09-02 LAB — MAGNESIUM: Magnesium: 2 mg/dL (ref 1.7–2.4)

## 2020-09-02 MED ORDER — SODIUM CHLORIDE 0.9% FLUSH
10.0000 mL | Freq: Once | INTRAVENOUS | Status: AC
Start: 2020-09-02 — End: 2020-09-02
  Administered 2020-09-02: 10 mL via INTRAVENOUS
  Filled 2020-09-02: qty 10

## 2020-09-02 MED ORDER — HEPARIN SOD (PORK) LOCK FLUSH 100 UNIT/ML IV SOLN
500.0000 [IU] | Freq: Once | INTRAVENOUS | Status: AC
  Administered 2020-09-02: 500 [IU] via INTRAVENOUS
  Filled 2020-09-02: qty 5

## 2020-09-14 ENCOUNTER — Other Ambulatory Visit: Payer: Self-pay | Admitting: Cardiovascular Disease

## 2020-09-14 DIAGNOSIS — I2510 Atherosclerotic heart disease of native coronary artery without angina pectoris: Secondary | ICD-10-CM

## 2020-09-30 ENCOUNTER — Inpatient Hospital Stay: Payer: Medicare Other

## 2020-10-02 ENCOUNTER — Other Ambulatory Visit: Payer: Self-pay | Admitting: Cardiovascular Disease

## 2020-10-02 NOTE — Progress Notes (Signed)
Cardiology Office Note  Date:  10/04/2020   ID:  Krista Cisneros, DOB [birth year], MRN [id number]  PCP:  Sharyne Peach, MD   Chief Complaint  Patient presents with   Other    6 Month f/u pt hasn't had physical therapy due to recent covid illness c/o sob and coughing. Meds reviewed verbally with pt.    HPI: Krista Cisneros is a 80 y.o. 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,  HTN,  HLD,  breast cancer s/p chemoradiation,  GERD admission to [facility] from 04/09/18 to 04/12/18 for NSTEMI, successful PCI/DES x 2 to the LAD at that time,  cardiac catheterization with stent to the left circumflex 04/2018 Who presents for CAD, prior stents  Discussed recent events COVID December 2021 Diagnosis of bronchopneumonia June 20, 2020 Was on Levaquin  Last echocardiogram June 2021, ejection fraction 50 to 55%  No recent falls Sedentary No regular exercise program  Still with bad cough,  Followed by pulmonary, started on prednisone/doxy (3 days so far) Feet swelling on the prednisone, started taking Lasix past day or so   Lab work reviewed with her Total chol 117, LDL 53 No recent A1c  EKG personally reviewed by myself on todays visit Showing normal sinus rhythm, rate rate 76 bpm, no significant ST or T wave changes  Other past medical history reviewed cath 04/10/18 Multivessel disease Severe mid LAD disease 95%, mid to distal stenosis as well 80% Proximal OM 3 disease large vessel 95% Moderate RCA disease 50% mid vessel  Successful angioplasty and drug-eluting stent placement to the LAD x2.  1. 90% mid LCx stenosis, unchanged from catheterization last month. 2. Widely patent proximal and mid LAD stents. 3. Successful PCI to mid LCx using Resolute Onyx 2.25 x 15 mm drug-eluting stent (postdilated to 2.6 mm) with 0% residual stenosis and TIMI-3  flow.   On 05/14/2018 NSTEMI along with chest pain and in worsening fatigue.   cardiac catheterization results detailed below, discussed with her 2 stents to the LAD, staged procedure with follow-up stent to the left circumflex  Ejection fraction of 30 to 35% by echocardiogram April 10, 2018   PMH:   has a past medical history of Anxiety, Arthritis, CAD (coronary artery disease), Cancer ([place]), Chronic systolic CHF (congestive heart failure) ([place]), COPD (chronic obstructive pulmonary disease) ([place]), Depression, Dyspnea, GERD (gastroesophageal reflux disease), Hyperlipidemia, Hypertension, Personal history of chemotherapy (2018), and Personal history of radiation therapy (2019).  PSH:    Past Surgical History:  Procedure Laterality Date   ABDOMINAL HYSTERECTOMY  1990   Partial   BREAST BIOPSY Right 10/04/2016   axilla lymph node (metastatic carcinoma) and axillay tail mass biopsy-invasive mammary carcinoma   BREAST LUMPECTOMY Right 03/21/2017   chemo first, [facility] and metastatic LN   BREAST LUMPECTOMY Right 05/21/2017   re-excision for clip   BREAST LUMPECTOMY WITH NEEDLE LOCALIZATION Right 03/21/2017   Procedure: BREAST LUMPECTOMY WITH NEEDLE LOCALIZATION;  Surgeon: Clayburn Pert, MD;  Location: [facility];  Service: General;  Laterality: Right;   CORONARY STENT INTERVENTION N/A 04/10/2018   Procedure: CORONARY STENT INTERVENTION;  Surgeon: Wellington Hampshire, MD;  Location: [place] [facility];  Service: Cardiovascular;  Laterality: N/A;   CORONARY STENT INTERVENTION N/A 05/14/2018   Procedure: CORONARY STENT INTERVENTION;  Surgeon: Nelva Bush, MD;  Location: [place] [facility];  Service:  Cardiovascular;  Laterality: N/A;   DILATION AND CURETTAGE OF UTERUS     INGUINAL HERNIA REPAIR Right 03/26/2017   Procedure: HERNIA REPAIR INGUINAL INCARCERATED;  Surgeon: Jules Husbands, MD;  Location: [facility];  Service: General;  Laterality: Right;   LEFT HEART CATH AND CORONARY  ANGIOGRAPHY N/A 04/10/2018   Procedure: LEFT HEART CATH AND CORONARY ANGIOGRAPHY poss PCI;  Surgeon: Minna Merritts, MD;  Location: [place] [facility];  Service: Cardiovascular;  Laterality: N/A;   PORTACATH PLACEMENT Left 10/24/2016   Procedure: INSERTION PORT-A-CATH;  Surgeon: [name], MD;  Location: [facility];  Service: General;  Laterality: Left;   RE-EXCISION OF BREAST LUMPECTOMY Right 05/21/2017   Procedure: RE-EXCISION OF BREAST LUMPECTOMY;  Surgeon: Clayburn Pert, MD;  Location: [facility];  Service: General;  Laterality: Right;   SENTINEL NODE BIOPSY Right 03/21/2017   Procedure: SENTINEL NODE BIOPSY;  Surgeon: Clayburn Pert, MD;  Location: [facility];  Service: General;  Laterality: Right;    Current Outpatient Medications  Medication Sig Dispense Refill   albuterol (PROVENTIL HFA;VENTOLIN HFA) 108 (90 Base) MCG/ACT inhaler Inhale 2 puffs into the lungs every 6 (six) hours as needed for wheezing or shortness of breath.     baclofen (LIORESAL) 10 MG tablet Take 10 mg by mouth at bedtime.  9   BREO ELLIPTA 200-25 MCG/INH AEPB daily.     cholecalciferol (VITAMIN D3) 25 MCG (1000 UT) tablet Take 1,000 Units by mouth daily.     Coenzyme Q10 300 MG CAPS Take 300 mg by mouth daily.      doxycycline (DORYX) 100 MG EC tablet Take 100 mg by mouth 2 (two) times daily.     DULoxetine (CYMBALTA) 30 MG capsule Take 30 mg by mouth every morning. Takes with 60 mg for a total of 90mg   11   DULoxetine (CYMBALTA) 60 MG capsule Take 60 mg by mouth daily. Takes with 30mg  for a total of 90mg      enalapril (VASOTEC) 5 MG tablet Take 1 tablet (5 mg total) by mouth daily. 90 tablet 3   fexofenadine (ALLEGRA) 180 MG tablet Take 180 mg by mouth daily.     fluticasone (FLONASE) 50 MCG/ACT nasal spray Place 2 sprays into the nose daily.     guaiFENesin-dextromethorphan (ROBITUSSIN DM) 100-10 MG/5ML syrup Take 5 mLs by mouth every 4 (four) hours as needed for cough. 118 mL 0   letrozole (FEMARA) 2.5 MG  tablet TAKE 1 TABLET BY MOUTH EVERY DAY 90 tablet 3   lidocaine-prilocaine (EMLA) cream Apply 1 application as needed topically (for port access).     magnesium oxide (MAG-OX) 400 MG tablet Take 400 mg by mouth daily.     Melatonin 5 MG TABS Take 10 mg by mouth at bedtime as needed (sleep).     methylPREDNISolone (MEDROL) 4 MG tablet Take 4 mg by mouth daily.     montelukast (SINGULAIR) 10 MG tablet Take 10 mg by mouth at bedtime.      nitroGLYCERIN (NITROSTAT) 0.4 MG SL tablet Place 1 tablet (0.4 mg total) under the tongue every 5 (five) minutes as needed for chest pain. 25 tablet 6   pantoprazole (PROTONIX) 40 MG tablet TAKE 1 TABLET BY MOUTH EVERY DAY 90 tablet 0   rosuvastatin (CRESTOR) 20 MG tablet TAKE 1 TABLET BY MOUTH EVERY DAY AT 6PM 90 tablet 0   carvedilol (COREG) 3.125 MG tablet Take 2 tablets (6.25 mg total) by mouth 2 (two) times daily. 90 tablet 3  clopidogrel (PLAVIX) 75 MG tablet Take 1 tablet (75 mg total) by mouth daily with breakfast. 90 tablet 4   furosemide (LASIX) 20 MG tablet Take 1 tablet (20 mg total) by mouth daily as needed for edema. 30 tablet 11   spironolactone (ALDACTONE) 25 MG tablet Take 0.5 tablets (12.5 mg total) by mouth daily. 45 tablet 3   No current facility-administered medications for this visit.   Facility-Administered Medications Ordered in Other Visits  Medication Dose Route Frequency Provider Last Rate Last Admin   heparin lock flush 100 unit/mL  500 Units Intravenous Once Lloyd Huger, MD       ondansetron [facility]) 8 mg in sodium chloride 0.9 % 50 mL IVPB   Intravenous Once Lloyd Huger, MD         Allergies:   Nsaids, Penicillins, Tamsulosin, Atorvastatin, Gabapentin, Ezetimibe, and Aspirin   Social History:  The patient  reports that she quit smoking about 32 years ago. Her smoking use included cigarettes. She smoked an average of 0.50 packs per day. She has never used smokeless tobacco. She reports current alcohol use. She  reports that she does not use drugs.   Family History:   family history includes Colon cancer in her mother.    Review of Systems: Review of Systems  Constitutional: Negative.   HENT: Negative.    Respiratory: Negative.  Negative for shortness of breath.   Cardiovascular: Negative.  Negative for chest pain.  Gastrointestinal: Negative.   Genitourinary: Negative.   Musculoskeletal: Negative.   Neurological: Negative.   Psychiatric/Behavioral: Negative.    All other systems reviewed and are negative.  PHYSICAL EXAM: VS:  BP 120/60 (BP Location: Left Arm, Patient Position: Sitting, Cuff Size: Normal)   Pulse 76   Ht 5\' 2"  (1.575 m)   Wt 151 lb 4 oz (68.6 kg)   SpO2 97%   BMI 27.66 kg/m  , BMI Body mass index is 27.66 kg/m.  Constitutional:  oriented to person, place, and time. No distress.  HENT:  Head: Grossly normal Eyes:  no discharge. No scleral icterus.  Neck: No JVD, no carotid bruits  Cardiovascular: Regular rate and rhythm, no murmurs appreciated Pulmonary/Chest: Clear to auscultation bilaterally, no wheezes or rails Abdominal: Soft.  no distension.  no tenderness.  Musculoskeletal: Normal range of motion Neurological:  normal muscle tone. Coordination normal. No atrophy Skin: Skin warm and dry Psychiatric: normal affect, pleasant   Recent Labs: 06/17/2020: TSH 3.566 09/02/2020: ALT 14; BUN 45; Creatinine, Ser 2.03; Hemoglobin 12.5; Magnesium 2.0; Platelets 379; Potassium 4.7; Sodium 139    Lipid Panel Lab Results  Component Value Date   CHOL 189 04/10/2018   HDL 40 (L) 04/10/2018   LDLCALC 134 (H) 04/10/2018   TRIG 77 04/10/2018     Wt Readings from Last 3 Encounters:  10/04/20 151 lb 4 oz (68.6 kg)  06/18/20 149 lb 7.6 oz (67.8 kg)  06/02/20 (P) 153 lb 8 oz (69.6 kg)       ASSESSMENT AND PLAN:  Coronary artery disease involving native coronary artery of native heart with chronic stable angina pectoris (HCC) Currently with no symptoms of angina.  No further workup at this time. Continue current medication regimen.  Non-STEMI (non-ST elevated myocardial infarction) ([place]) Prior ejection fraction 30 to 35% echocardiogram 09/2019 normal 50 to 55% Denies angina No further work-up at this time, cholesterol at goal, non-smoker, no diabetes  Centrilobular emphysema (HCC) Stable, previously stopped smoking Not requiring oxygen Prior history COPD exacerbation bronchopneumonia  CRI Followed by primary care Creatinine 1.5 last year, 1.2 recently  Debility Recommend walking program  Acute on chronic bronchitis Discussed symptoms, now on prednisone, antibiotics Followed by pulmonary  Total encounter time more than 25 minutes. Greater than 50% was spent in counseling and coordination of care with the patient.   Orders Placed This Encounter  Procedures   EKG 12-Lead   I, Margit Banda am acting as a scribe for Ida Rogue, M.D., Ph.D.  I have reviewed the above documentation for accuracy and completeness, and I agree with the above.   Signed, Esmond Plants, M.D., Ph.D. 10/04/2020  [place], [place]

## 2020-10-04 ENCOUNTER — Ambulatory Visit: Payer: Medicare Other | Admitting: Cardiovascular Disease

## 2020-10-04 ENCOUNTER — Encounter: Payer: Self-pay | Admitting: Cardiovascular Disease

## 2020-10-04 ENCOUNTER — Other Ambulatory Visit: Payer: Self-pay

## 2020-10-04 ENCOUNTER — Other Ambulatory Visit: Payer: Self-pay | Admitting: Cardiovascular Disease

## 2020-10-04 VITALS — BP 120/60 | HR 76 | Ht 62.0 in | Wt 151.2 lb

## 2020-10-04 DIAGNOSIS — I255 Ischemic cardiomyopathy: Secondary | ICD-10-CM | POA: Diagnosis not present

## 2020-10-04 DIAGNOSIS — I10 Essential (primary) hypertension: Secondary | ICD-10-CM

## 2020-10-04 DIAGNOSIS — E785 Hyperlipidemia, unspecified: Secondary | ICD-10-CM

## 2020-10-04 DIAGNOSIS — N179 Acute kidney failure, unspecified: Secondary | ICD-10-CM

## 2020-10-04 DIAGNOSIS — I25119 Atherosclerotic heart disease of native coronary artery with unspecified angina pectoris: Secondary | ICD-10-CM

## 2020-10-04 DIAGNOSIS — J432 Centrilobular emphysema: Secondary | ICD-10-CM

## 2020-10-04 DIAGNOSIS — I2510 Atherosclerotic heart disease of native coronary artery without angina pectoris: Secondary | ICD-10-CM

## 2020-10-04 DIAGNOSIS — I5022 Chronic systolic (congestive) heart failure: Secondary | ICD-10-CM | POA: Diagnosis not present

## 2020-10-04 DIAGNOSIS — I25118 Atherosclerotic heart disease of native coronary artery with other forms of angina pectoris: Secondary | ICD-10-CM | POA: Diagnosis not present

## 2020-10-04 MED ORDER — SPIRONOLACTONE 25 MG PO TABS: 12.5000 mg | ORAL_TABLET | Freq: Every day | ORAL | 3 refills | Status: DC

## 2020-10-04 MED ORDER — FUROSEMIDE 20 MG PO TABS: 20.0000 mg | ORAL_TABLET | Freq: Every day | ORAL | 11 refills | Status: DC | PRN

## 2020-10-04 MED ORDER — CARVEDILOL 3.125 MG PO TABS: 6.2500 mg | ORAL_TABLET | Freq: Two times a day (BID) | ORAL | 3 refills | Status: DC

## 2020-10-04 MED ORDER — CLOPIDOGREL BISULFATE 75 MG PO TABS: 75.0000 mg | ORAL_TABLET | Freq: Every day | ORAL | 4 refills | Status: DC

## 2020-10-04 NOTE — Patient Instructions (Signed)
Medication Instructions:  Stop aspirin  If you need a refill on your cardiac medications before your next appointment, please call your pharmacy.    Lab work: No new labs needed   If you have labs (blood work) drawn today and your tests are completely normal, you will receive your results only by: [place] (if you have MyChart) OR A paper copy in the mail If you have any lab test that is abnormal or we need to change your treatment, we will call you to review the results.   Testing/Procedures: No new testing needed   Follow-Up: At [facility], you and your health needs are our priority.  As part of our continuing mission to provide you with exceptional heart care, we have created designated Provider Care Teams.  These Care Teams include your primary Cardiologist (physician) and Advanced Practice Providers (APPs -  Physician Assistants and Nurse Practitioners) who all work together to provide you with the care you need, when you need it.  You will need a follow up appointment in 12 months  Providers on your designated Care Team:   Murray Hodgkins, NP Christell Faith, PA-C Marrianne Mood, PA-C  Any Other Special Instructions Will Be Listed Below (If Applicable).  COVID-19 Vaccine Information can be found at: ShippingScam.co.uk For questions related to vaccine distribution or appointments, please email [email or web address] or call [phone].

## 2020-11-02 ENCOUNTER — Ambulatory Visit: Payer: Medicare Other | Admitting: Oncology

## 2020-11-02 ENCOUNTER — Other Ambulatory Visit: Payer: Medicare Other

## 2020-11-02 ENCOUNTER — Ambulatory Visit: Payer: Medicare Other

## 2020-11-08 ENCOUNTER — Other Ambulatory Visit: Payer: Self-pay

## 2020-11-08 ENCOUNTER — Inpatient Hospital Stay: Payer: Medicare Other | Attending: Oncology | Admitting: Oncology

## 2020-11-08 ENCOUNTER — Inpatient Hospital Stay: Payer: Medicare Other

## 2020-11-08 ENCOUNTER — Encounter: Payer: Self-pay | Admitting: Oncology

## 2020-11-08 VITALS — BP 133/64 | HR 80 | Temp 96.8°F | Wt 146.0 lb

## 2020-11-08 DIAGNOSIS — Z171 Estrogen receptor negative status [ER-]: Secondary | ICD-10-CM

## 2020-11-08 DIAGNOSIS — D509 Iron deficiency anemia, unspecified: Secondary | ICD-10-CM | POA: Insufficient documentation

## 2020-11-08 DIAGNOSIS — Z95828 Presence of other vascular implants and grafts: Secondary | ICD-10-CM

## 2020-11-08 DIAGNOSIS — I130 Hypertensive heart and chronic kidney disease with heart failure and stage 1 through stage 4 chronic kidney disease, or unspecified chronic kidney disease: Secondary | ICD-10-CM | POA: Diagnosis not present

## 2020-11-08 DIAGNOSIS — C50411 Malignant neoplasm of upper-outer quadrant of right female breast: Secondary | ICD-10-CM

## 2020-11-08 DIAGNOSIS — Z79811 Long term (current) use of aromatase inhibitors: Secondary | ICD-10-CM | POA: Insufficient documentation

## 2020-11-08 DIAGNOSIS — N1831 Chronic kidney disease, stage 3a: Secondary | ICD-10-CM | POA: Diagnosis not present

## 2020-11-08 DIAGNOSIS — N189 Chronic kidney disease, unspecified: Secondary | ICD-10-CM | POA: Diagnosis not present

## 2020-11-08 LAB — BASIC METABOLIC PANEL
Anion gap: 9 (ref 5–15)
BUN: 45 mg/dL — ABNORMAL HIGH (ref 8–23)
CO2: 21 mmol/L — ABNORMAL LOW (ref 22–32)
Calcium: 9.1 mg/dL (ref 8.9–10.3)
Chloride: 104 mmol/L (ref 98–111)
Creatinine, Ser: 1.66 mg/dL — ABNORMAL HIGH (ref 0.44–1.00)
GFR, Estimated: 31 mL/min — ABNORMAL LOW (ref >60)
Glucose, Bld: 151 mg/dL — ABNORMAL HIGH (ref 70–99)
Potassium: 4.3 mmol/L (ref 3.5–5.1)
Sodium: 134 mmol/L — ABNORMAL LOW (ref 135–145)

## 2020-11-08 LAB — CBC
HCT: 42.3 % (ref 36.0–46.0)
Hemoglobin: 12.8 g/dL (ref 12.0–15.0)
MCH: 26.1 pg (ref 26.0–34.0)
MCHC: 30.3 g/dL (ref 30.0–36.0)
MCV: 86.3 fL (ref 80.0–100.0)
Platelets: 340 10*3/uL (ref 150–400)
RBC: 4.9 MIL/uL (ref 3.87–5.11)
RDW: 17.7 % — ABNORMAL HIGH (ref 11.5–15.5)
WBC: 12.2 10*3/uL — ABNORMAL HIGH (ref 4.0–10.5)
nRBC: 0 % (ref 0.0–0.2)

## 2020-11-08 LAB — IRON AND TIBC
Iron: 42 ug/dL (ref 28–170)
Saturation Ratios: 11 % (ref 10.4–31.8)
TIBC: 384 ug/dL (ref 250–450)
UIBC: 342 ug/dL

## 2020-11-08 LAB — FERRITIN: Ferritin: 17 ng/mL (ref 11–307)

## 2020-11-08 MED ORDER — SODIUM CHLORIDE 0.9% FLUSH
10.0000 mL | Freq: Once | INTRAVENOUS | Status: AC
  Administered 2020-11-08: 10 mL via INTRAVENOUS
  Filled 2020-11-08: qty 10

## 2020-11-08 MED ORDER — HEPARIN SOD (PORK) LOCK FLUSH 100 UNIT/ML IV SOLN
INTRAVENOUS | Status: AC
  Filled 2020-11-08: qty 5

## 2020-11-08 MED ORDER — HEPARIN SOD (PORK) LOCK FLUSH 100 UNIT/ML IV SOLN
500.0000 [IU] | Freq: Once | INTRAVENOUS | Status: AC
  Filled 2020-11-08: qty 5

## 2020-11-08 NOTE — Progress Notes (Signed)
Patient here for oncology follow-up appointment, expresses no complaints or concerns at this time.    

## 2020-11-08 NOTE — Progress Notes (Signed)
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  INTERVAL HISTORY: Patient returns to clinic today for routine 35-month evaluation and consideration of additional Venofer.  She was last seen in clinic on 05/04/2020.  She received IV iron at that time.  In the interim, she was hospitalized from 06/17/2020 - 06/20/2020 for cough and pleuritic chest pain with fever.  Imaging showed patchy consolidation in right upper and right lower lobes consistent with bronchopneumonia.  She was started on IV Levaquin with improvement of her symptoms.  She was discharged on oral antibiotics.  She was seen by Dr. 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  Echocardiogram noted improvement of her EF from 30 to 35% to 50 to 55% on 09/2019.   Today, she reports feeling well.  She denies any bleeding.  Occasionally has low back pain.  She typically increases her fluid intake and her symptoms resolved.  Denies any UTI-like symptoms.  She does not believe she needs IV iron today.  Has questions about having her port removed.  REVIEW OF SYSTEMS:   Review of Systems  Constitutional: Negative.  Negative for chills, fever, malaise/fatigue and weight loss.  HENT:  Negative for congestion, ear pain and tinnitus.   Eyes: Negative.  Negative for blurred vision and double  vision.  Respiratory: Negative.  Negative for cough, sputum production and shortness of breath.   Cardiovascular: Negative.  Negative for chest pain, palpitations and leg swelling.  Gastrointestinal: Negative.  Negative for abdominal pain, constipation, diarrhea, nausea and vomiting.  Genitourinary:  Negative for dysuria, frequency and urgency.  Musculoskeletal:  Positive for back pain. Negative for falls.  Skin: Negative.  Negative for rash.  Neurological: Negative.  Negative for weakness and headaches.  Endo/Heme/Allergies: Negative.  Does not bruise/bleed easily.  Psychiatric/Behavioral: Negative.  Negative for depression. The patient is not nervous/anxious and does not have insomnia.    As per HPI. Otherwise, a complete review of systems is negative.  PAST MEDICAL HISTORY: Past Medical History:  Diagnosis Date   Anxiety    Arthritis    CAD (coronary artery disease)    a. NSTEMI 12/19; b. LHC 04/10/18: pLAD 95%, mLAD 80%, m-dLCx 95%, OM3-1 lesion 40%, OM3-2 lesion 60%, mRCA 50%, EF 35-45%, successful PCI/DES x 2 to the LAD with recommended staged PCI of the LCx in a few weeks   Cancer (HCC)    Right Breast Cancer   Chronic systolic CHF (congestive heart failure) ([place])    a. TTE 12/19: EF 30-35%, anteroseptal, anterior, and apical HK, Gr1DD, mild to mod MR, mildly dilated LA, RVSF nl   COPD (chronic obstructive pulmonary disease) (HCC)    Depression    Dyspnea    with exertion   GERD (gastroesophageal reflux disease)    Hyperlipidemia    Hypertension    Personal history of chemotherapy 2018   chemo prior to lumpectomy of right breast   Personal history of radiation therapy 2019  right breast ca    PAST SURGICAL HISTORY: Past Surgical History:  Procedure Laterality Date   ABDOMINAL HYSTERECTOMY  1990   Partial   BREAST BIOPSY Right 10/04/2016   axilla lymph node (metastatic carcinoma) and axillay tail mass biopsy-invasive mammary carcinoma   BREAST LUMPECTOMY Right  03/21/2017   chemo first, [facility] and metastatic LN   BREAST LUMPECTOMY Right 05/21/2017   re-excision for clip   BREAST LUMPECTOMY WITH NEEDLE LOCALIZATION Right 03/21/2017   Procedure: BREAST LUMPECTOMY WITH NEEDLE LOCALIZATION;  Surgeon: Clayburn Pert, MD;  Location: [facility];  Service: General;  Laterality: Right;   CORONARY STENT INTERVENTION N/A 04/10/2018   Procedure: CORONARY STENT INTERVENTION;  Surgeon: Wellington Hampshire, MD;  Location: [place] [facility];  Service: Cardiovascular;  Laterality: N/A;   CORONARY STENT INTERVENTION N/A 05/14/2018   Procedure: CORONARY STENT INTERVENTION;  Surgeon: Nelva Bush, MD;  Location: [place] [facility];  Service: Cardiovascular;  Laterality: N/A;   DILATION AND CURETTAGE OF UTERUS     INGUINAL HERNIA REPAIR Right 03/26/2017   Procedure: HERNIA REPAIR INGUINAL INCARCERATED;  Surgeon: Jules Husbands, MD;  Location: [facility];  Service: General;  Laterality: Right;   LEFT HEART CATH AND CORONARY ANGIOGRAPHY N/A 04/10/2018   Procedure: LEFT HEART CATH AND CORONARY ANGIOGRAPHY poss PCI;  Surgeon: Minna Merritts, MD;  Location: [place] [facility];  Service: Cardiovascular;  Laterality: N/A;   PORTACATH PLACEMENT Left 10/24/2016   Procedure: INSERTION PORT-A-CATH;  Surgeon: [name], MD;  Location: [facility];  Service: General;  Laterality: Left;   RE-EXCISION OF BREAST LUMPECTOMY Right 05/21/2017   Procedure: RE-EXCISION OF BREAST LUMPECTOMY;  Surgeon: Clayburn Pert, MD;  Location: [facility];  Service: General;  Laterality: Right;   SENTINEL NODE BIOPSY Right 03/21/2017   Procedure: SENTINEL NODE BIOPSY;  Surgeon: Clayburn Pert, MD;  Location: [facility];  Service: General;  Laterality: Right;    FAMILY HISTORY: Family History  Problem Relation Age of Onset   Colon cancer Mother    Breast cancer Neg Hx     ADVANCED DIRECTIVES (Y/N):  N  HEALTH MAINTENANCE: Social History   Tobacco Use   Smoking status: Former    Packs/day:  0.50    Types: Cigarettes    Quit date: 07/23/1988    Years since quitting: 32.3   Smokeless tobacco: Never  Vaping Use   Vaping Use: Never used  Substance Use Topics   Alcohol use: Yes    Comment: rare   Drug use: No     Colonoscopy:  PAP:  Bone density:  Lipid panel:  Allergies  Allergen Reactions   Nsaids     Bleeding risk   Penicillins Anaphylaxis, Swelling and Rash    Has patient had a PCN reaction causing immediate rash, facial/tongue/throat swelling, SOB or lightheadedness with hypotension: Yes Has patient had a PCN reaction causing severe rash involving mucus membranes or skin necrosis: No Has patient had a PCN reaction that required hospitalization: No  Has patient had a PCN reaction occurring within the last 10 years: No If all of the above answers are "NO", then may proceed with Cephalosporin use.    Tamsulosin Nausea Only   Atorvastatin Other (See Comments)    Muscle Pain   Gabapentin Swelling   Ezetimibe Other (See Comments)    Muscle pain   Aspirin     GI bleeding risk    Current Outpatient Medications  Medication Sig Dispense Refill   albuterol (PROVENTIL HFA;VENTOLIN HFA) 108 (90  Base) MCG/ACT inhaler Inhale 2 puffs into the lungs every 6 (six) hours as needed for wheezing or shortness of breath.     baclofen (LIORESAL) 10 MG tablet Take 10 mg by mouth at bedtime.  9   BREO ELLIPTA 200-25 MCG/INH AEPB daily.     carvedilol (COREG) 3.125 MG tablet Take 2 tablets (6.25 mg total) by mouth 2 (two) times daily. 90 tablet 3   cholecalciferol (VITAMIN D3) 25 MCG (1000 UT) tablet Take 1,000 Units by mouth daily.     clopidogrel (PLAVIX) 75 MG tablet TAKE 1 TABLET (75 MG TOTAL) BY MOUTH DAILY WITH BREAKFAST. 90 tablet 3   clopidogrel (PLAVIX) 75 MG tablet Take 1 tablet (75 mg total) by mouth daily with breakfast. 90 tablet 4   Coenzyme Q10 300 MG CAPS Take 300 mg by mouth daily.      DULoxetine (CYMBALTA) 30 MG capsule Take 30 mg by mouth every morning. Takes  with 60 mg for a total of $Remove'90mg'AvDBkBR$   11   DULoxetine (CYMBALTA) 60 MG capsule Take 60 mg by mouth daily. Takes with $RemoveBe'30mg'BhTVxTDqj$  for a total of $Remove'90mg'otZNbQi$      enalapril (VASOTEC) 5 MG tablet Take 1 tablet (5 mg total) by mouth daily. 90 tablet 3   fexofenadine (ALLEGRA) 180 MG tablet Take 180 mg by mouth daily.     fluticasone (FLONASE) 50 MCG/ACT nasal spray Place 2 sprays into the nose daily.     furosemide (LASIX) 20 MG tablet Take 1 tablet (20 mg total) by mouth daily as needed for edema. 30 tablet 11   guaiFENesin-dextromethorphan (ROBITUSSIN DM) 100-10 MG/5ML syrup Take 5 mLs by mouth every 4 (four) hours as needed for cough. 118 mL 0   letrozole (FEMARA) 2.5 MG tablet TAKE 1 TABLET BY MOUTH EVERY DAY 90 tablet 3   lidocaine-prilocaine (EMLA) cream Apply 1 application as needed topically (for port access).     magnesium oxide (MAG-OX) 400 MG tablet Take 400 mg by mouth daily.     Melatonin 5 MG TABS Take 10 mg by mouth at bedtime as needed (sleep).     montelukast (SINGULAIR) 10 MG tablet Take 10 mg by mouth at bedtime.      pantoprazole (PROTONIX) 40 MG tablet TAKE 1 TABLET BY MOUTH EVERY DAY 90 tablet 0   rosuvastatin (CRESTOR) 20 MG tablet TAKE 1 TABLET BY MOUTH EVERY DAY AT 6PM 90 tablet 0   spironolactone (ALDACTONE) 25 MG tablet Take 0.5 tablets (12.5 mg total) by mouth daily. 45 tablet 3   tiotropium (SPIRIVA) 18 MCG inhalation capsule Place into inhaler and inhale.     doxycycline (DORYX) 100 MG EC tablet Take 100 mg by mouth 2 (two) times daily. (Patient not taking: Reported on 11/08/2020)     methylPREDNISolone (MEDROL) 4 MG tablet Take 4 mg by mouth daily. (Patient not taking: Reported on 11/08/2020)     nitroGLYCERIN (NITROSTAT) 0.4 MG SL tablet Place 1 tablet (0.4 mg total) under the tongue every 5 (five) minutes as needed for chest pain. (Patient not taking: Reported on 11/08/2020) 25 tablet 6   No current facility-administered medications for this visit.   Facility-Administered Medications  Ordered in Other Visits  Medication Dose Route Frequency Provider Last Rate Last Admin   heparin lock flush 100 unit/mL  500 Units Intravenous Once Lloyd Huger, MD       heparin lock flush 100 unit/mL  500 Units Intravenous Once Lloyd Huger, MD       ondansetron (  ZOFRAN) 8 mg in sodium chloride 0.9 % 50 mL IVPB   Intravenous Once Lloyd Huger, MD        OBJECTIVE: Vitals:   11/08/20 1343  BP: 133/64  Pulse: 80  Temp: (!) 96.8 F (36 C)  SpO2: 98%     Body mass index is 26.7 kg/m.    ECOG FS:0 - Asymptomatic  Physical Exam Constitutional:      Appearance: Normal appearance.  HENT:     Head: Normocephalic and atraumatic.  Eyes:     Pupils: Pupils are equal, round, and reactive to light.  Cardiovascular:     Rate and Rhythm: Normal rate and regular rhythm.     Heart sounds: Normal heart sounds. No murmur heard. Pulmonary:     Effort: Pulmonary effort is normal.     Breath sounds: Normal breath sounds. No wheezing.  Abdominal:     General: Bowel sounds are normal. There is no distension.     Palpations: Abdomen is soft.     Tenderness: There is no abdominal tenderness.  Musculoskeletal:        General: Normal range of motion.     Cervical back: Normal range of motion.  Skin:    General: Skin is warm and dry.     Findings: No rash.  Neurological:     Mental Status: She is alert and oriented to person, place, and time.  Psychiatric:        Judgment: Judgment normal.    LAB RESULTS:  Lab Results  Component Value Date   NA 134 (L) 11/08/2020   K 4.3 11/08/2020   CL 104 11/08/2020   CO2 21 (L) 11/08/2020   GLUCOSE 151 (H) 11/08/2020   BUN 45 (H) 11/08/2020   CREATININE 1.66 (H) 11/08/2020   CALCIUM 9.1 11/08/2020   PROT 7.8 09/02/2020   ALBUMIN 3.2 (L) 09/02/2020   AST 23 09/02/2020   ALT 14 09/02/2020   ALKPHOS 54 09/02/2020   BILITOT 0.4 09/02/2020   GFRNONAA 31 (L) 11/08/2020   GFRAA 39 (L) 12/01/2019    Lab Results  Component  Value Date   WBC 12.2 (H) 11/08/2020   NEUTROABS 16.3 (H) 09/02/2020   HGB 12.8 11/08/2020   HCT 42.3 11/08/2020   MCV 86.3 11/08/2020   PLT 340 11/08/2020   Lab Results  Component Value Date   IRON 42 11/08/2020   TIBC 384 11/08/2020   IRONPCTSAT 11 11/08/2020   Lab Results  Component Value Date   FERRITIN 17 11/08/2020     STUDIES: No results found.  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  PLAN:    1. 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   Treatment was discontinued secondary to declining performance status.   Patient underwent lumpectomy on March 21, 2017, but no biopsy clip was seen in her surgical specimen.   She had repeat surgery on May 21, 2017 to remove her retained clip, which confirmed a complete pathological response.  Patient completed her adjuvant XRT on September 27, 2017.   Continue letrozole for total 5 years completing treatment in February 2024.   Patient's most recent mammogram on January 01, 2020 reported a new 3 mm group of calcifications in the right breast.   She had short-term right breast  follow-up in March 2022 which is stable. She is due for a repeat bilateral screening mammogram in September 2022. She had her port placed by Dr. Faith Rogue back in 2018.  We will refer her back for port removal.  3. CKD Baseline creatinine is between 1.5 and 2 Today's creatinine is 1.66. Recommend hydration. If hemoglobin does not improve patient might benefit from EPO stimulating agent.   4.  Iron deficiency anemia:  Labs from today show improvement of her hemoglobin. Iron levels are pending during dictation. She last received IV iron on 05/04/2020.  5.  Bone health:  Bone mineral density on January 01, 2020 reported  T score of -0.8 which is considered normal.   Continue calcium and vitamin D supplementation.   Repeat in September 2023.    6.  Cardiac disease:  Patient now has 3 stents placed.   Recent echo showed improvement of her ejection fraction. Continue follow-up and treatment per cardiology.  Disposition: No IV iron today. Will call patient if labs have significantly trended down. Recommend she return to clinic in 3 months for repeat lab work (CMP, CBC, ferritin and iron panel), MD assessment and possible iron.  Greater than 50% was spent in counseling and coordination of care with this patient including but not limited to discussion of the relevant topics above (See A&P) including, but not limited to diagnosis and management of acute and chronic medical conditions.   Patient expressed understanding and was in agreement with this plan. She also understands that She can call clinic at any time with any questions, concerns, or complaints.   Cancer Staging Malignant neoplasm of right female breast [facility]) Staging form: Breast, AJCC 8th Edition - Clinical stage from 10/16/2016: Stage IIB (cT2, cN1, cM0, G3, ER-, PR+, HER2+) - Signed by Lloyd Huger, MD on 10/16/2016 Histologic grading system: 3 grade system Laterality: Right   Jacquelin Hawking, NP   11/08/2020 3:14 PM

## 2020-11-08 NOTE — Progress Notes (Addendum)
This RN notified infusion RN no iron today per NP. RN made aware patient was d/c'd with port accessed. RN called patient and asked her to come back. Patient agreed. Port deaccessed. Patient was given gas card. All concerns addressed. Patient notified that [facility] removal request faxed to Dr. Bunnie Domino office per NP, Quay Burow. Patient discharged.

## 2020-11-08 NOTE — Addendum Note (Signed)
Addended by: Jorene Minors on: 11/08/2020 03:38 PM   Modules accepted: Orders

## 2020-11-13 ENCOUNTER — Emergency Department: Payer: Medicare Other

## 2020-11-13 ENCOUNTER — Inpatient Hospital Stay
Admission: EM | Admit: 2020-11-13 | Discharge: 2020-11-17 | DRG: 871 | Disposition: A | Discharge status: Skilled Nursing Facility | Payer: Medicare Other | Attending: Hospitalist | Admitting: Hospitalist

## 2020-11-13 ENCOUNTER — Other Ambulatory Visit: Payer: Self-pay

## 2020-11-13 ENCOUNTER — Inpatient Hospital Stay: Payer: Medicare Other

## 2020-11-13 DIAGNOSIS — I10 Essential (primary) hypertension: Secondary | ICD-10-CM | POA: Diagnosis present

## 2020-11-13 DIAGNOSIS — J189 Pneumonia, unspecified organism: Secondary | ICD-10-CM | POA: Diagnosis present

## 2020-11-13 DIAGNOSIS — I130 Hypertensive heart and chronic kidney disease with heart failure and stage 1 through stage 4 chronic kidney disease, or unspecified chronic kidney disease: Secondary | ICD-10-CM | POA: Diagnosis present

## 2020-11-13 DIAGNOSIS — Z800 Family history of malignant neoplasm of digestive organs: Secondary | ICD-10-CM

## 2020-11-13 DIAGNOSIS — I5032 Chronic diastolic (congestive) heart failure: Secondary | ICD-10-CM | POA: Diagnosis present

## 2020-11-13 DIAGNOSIS — Z79899 Other long term (current) drug therapy: Secondary | ICD-10-CM | POA: Diagnosis not present

## 2020-11-13 DIAGNOSIS — E785 Hyperlipidemia, unspecified: Secondary | ICD-10-CM | POA: Diagnosis present

## 2020-11-13 DIAGNOSIS — N1832 Chronic kidney disease, stage 3b: Secondary | ICD-10-CM | POA: Diagnosis present

## 2020-11-13 DIAGNOSIS — A419 Sepsis, unspecified organism: Principal | ICD-10-CM | POA: Diagnosis present

## 2020-11-13 DIAGNOSIS — F32A Depression, unspecified: Secondary | ICD-10-CM | POA: Diagnosis present

## 2020-11-13 DIAGNOSIS — I25119 Atherosclerotic heart disease of native coronary artery with unspecified angina pectoris: Secondary | ICD-10-CM | POA: Diagnosis present

## 2020-11-13 DIAGNOSIS — Z880 Allergy status to penicillin: Secondary | ICD-10-CM | POA: Diagnosis not present

## 2020-11-13 DIAGNOSIS — Z87891 Personal history of nicotine dependence: Secondary | ICD-10-CM

## 2020-11-13 DIAGNOSIS — Z7902 Long term (current) use of antithrombotics/antiplatelets: Secondary | ICD-10-CM

## 2020-11-13 DIAGNOSIS — Z20822 Contact with and (suspected) exposure to covid-19: Secondary | ICD-10-CM | POA: Diagnosis present

## 2020-11-13 DIAGNOSIS — Z79811 Long term (current) use of aromatase inhibitors: Secondary | ICD-10-CM | POA: Diagnosis not present

## 2020-11-13 DIAGNOSIS — I2510 Atherosclerotic heart disease of native coronary artery without angina pectoris: Secondary | ICD-10-CM

## 2020-11-13 DIAGNOSIS — Z955 Presence of coronary angioplasty implant and graft: Secondary | ICD-10-CM

## 2020-11-13 DIAGNOSIS — F419 Anxiety disorder, unspecified: Secondary | ICD-10-CM | POA: Diagnosis present

## 2020-11-13 DIAGNOSIS — N189 Chronic kidney disease, unspecified: Secondary | ICD-10-CM | POA: Diagnosis present

## 2020-11-13 DIAGNOSIS — J439 Emphysema, unspecified: Secondary | ICD-10-CM | POA: Diagnosis present

## 2020-11-13 DIAGNOSIS — N179 Acute kidney failure, unspecified: Secondary | ICD-10-CM | POA: Diagnosis present

## 2020-11-13 DIAGNOSIS — J441 Chronic obstructive pulmonary disease with (acute) exacerbation: Secondary | ICD-10-CM | POA: Diagnosis present

## 2020-11-13 DIAGNOSIS — Z8701 Personal history of pneumonia (recurrent): Secondary | ICD-10-CM | POA: Diagnosis not present

## 2020-11-13 DIAGNOSIS — Z853 Personal history of malignant neoplasm of breast: Secondary | ICD-10-CM

## 2020-11-13 DIAGNOSIS — Z923 Personal history of irradiation: Secondary | ICD-10-CM

## 2020-11-13 DIAGNOSIS — Z9221 Personal history of antineoplastic chemotherapy: Secondary | ICD-10-CM

## 2020-11-13 DIAGNOSIS — J9601 Acute respiratory failure with hypoxia: Secondary | ICD-10-CM

## 2020-11-13 DIAGNOSIS — Z881 Allergy status to other antibiotic agents status: Secondary | ICD-10-CM | POA: Diagnosis not present

## 2020-11-13 DIAGNOSIS — K219 Gastro-esophageal reflux disease without esophagitis: Secondary | ICD-10-CM | POA: Diagnosis present

## 2020-11-13 LAB — RESP PANEL BY RT-PCR (FLU A&B, COVID) ARPGX2
Influenza A by PCR: NEGATIVE
Influenza B by PCR: NEGATIVE
SARS Coronavirus 2 by RT PCR: NEGATIVE

## 2020-11-13 LAB — COMPREHENSIVE METABOLIC PANEL
ALT: 27 U/L (ref 0–44)
AST: 57 U/L — ABNORMAL HIGH (ref 15–41)
Albumin: 2.7 g/dL — ABNORMAL LOW (ref 3.5–5.0)
Alkaline Phosphatase: 51 U/L (ref 38–126)
Anion gap: 13 (ref 5–15)
BUN: 61 mg/dL — ABNORMAL HIGH (ref 8–23)
CO2: 20 mmol/L — ABNORMAL LOW (ref 22–32)
Calcium: 8.3 mg/dL — ABNORMAL LOW (ref 8.9–10.3)
Chloride: 101 mmol/L (ref 98–111)
Creatinine, Ser: 2.18 mg/dL — ABNORMAL HIGH (ref 0.44–1.00)
GFR, Estimated: 22 mL/min — ABNORMAL LOW (ref >60)
Glucose, Bld: 115 mg/dL — ABNORMAL HIGH (ref 70–99)
Potassium: 4.1 mmol/L (ref 3.5–5.1)
Sodium: 134 mmol/L — ABNORMAL LOW (ref 135–145)
Total Bilirubin: 0.9 mg/dL (ref 0.3–1.2)
Total Protein: 7 g/dL (ref 6.5–8.1)

## 2020-11-13 LAB — CBC WITH DIFFERENTIAL/PLATELET
Abs Immature Granulocytes: 0.07 10*3/uL (ref 0.00–0.07)
Basophils Absolute: 0 10*3/uL (ref 0.0–0.1)
Basophils Relative: 0 %
Eosinophils Absolute: 0 10*3/uL (ref 0.0–0.5)
Eosinophils Relative: 0 %
HCT: 40.8 % (ref 36.0–46.0)
Hemoglobin: 12.5 g/dL (ref 12.0–15.0)
Immature Granulocytes: 0 %
Lymphocytes Relative: 14 %
Lymphs Abs: 2.4 10*3/uL (ref 0.7–4.0)
MCH: 26 pg (ref 26.0–34.0)
MCHC: 30.6 g/dL (ref 30.0–36.0)
MCV: 85 fL (ref 80.0–100.0)
Monocytes Absolute: 2.4 10*3/uL — ABNORMAL HIGH (ref 0.1–1.0)
Monocytes Relative: 14 %
Neutro Abs: 12.5 10*3/uL — ABNORMAL HIGH (ref 1.7–7.7)
Neutrophils Relative %: 72 %
Platelets: 263 10*3/uL (ref 150–400)
RBC: 4.8 MIL/uL (ref 3.87–5.11)
RDW: 17.7 % — ABNORMAL HIGH (ref 11.5–15.5)
WBC: 17.5 10*3/uL — ABNORMAL HIGH (ref 4.0–10.5)
nRBC: 0 % (ref 0.0–0.2)

## 2020-11-13 LAB — LACTIC ACID, PLASMA: Lactic Acid, Venous: 1.2 mmol/L (ref 0.5–1.9)

## 2020-11-13 LAB — PROTIME-INR
INR: 1.1 (ref 0.8–1.2)
Prothrombin Time: 14.1 seconds (ref 11.4–15.2)

## 2020-11-13 LAB — PROCALCITONIN: Procalcitonin: 1.04 ng/mL

## 2020-11-13 LAB — TROPONIN I (HIGH SENSITIVITY): Troponin I (High Sensitivity): 17 ng/L (ref <18)

## 2020-11-13 LAB — BRAIN NATRIURETIC PEPTIDE: B Natriuretic Peptide: 85.8 pg/mL (ref 0.0–100.0)

## 2020-11-13 IMAGING — CT CT HEAD W/O CM
4 series · 16 of 47 positions shown, 18 images · non-contrast
Comparison: None.

CLINICAL DATA: Fall, shortness of breath, weakness

EXAM:
CT HEAD WITHOUT CONTRAST
TECHNIQUE: Contiguous axial images were obtained from the base of the skull
through the vertex without intravenous contrast.

[Series 2: ax head wo · axial · 0.37mm/px · z∈[+258,+390]mm · 8 of 38 slices shown, 10 images]
[im 5/38  brain]
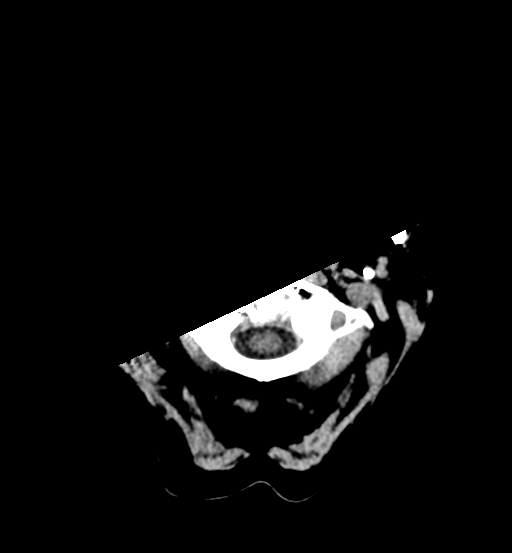
[im 5/38  bone]
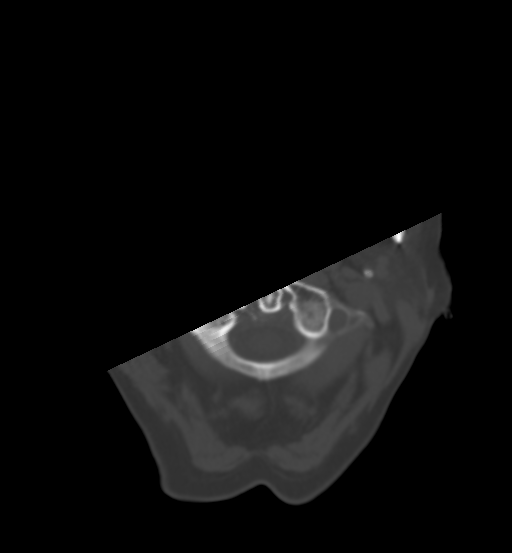
[im 9/38  brain]
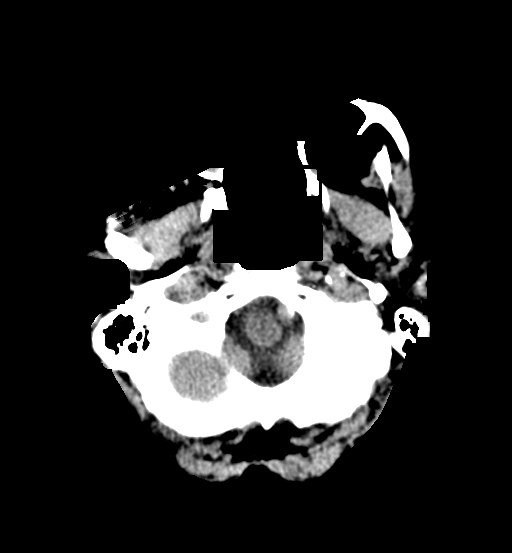
[im 13/38  brain]
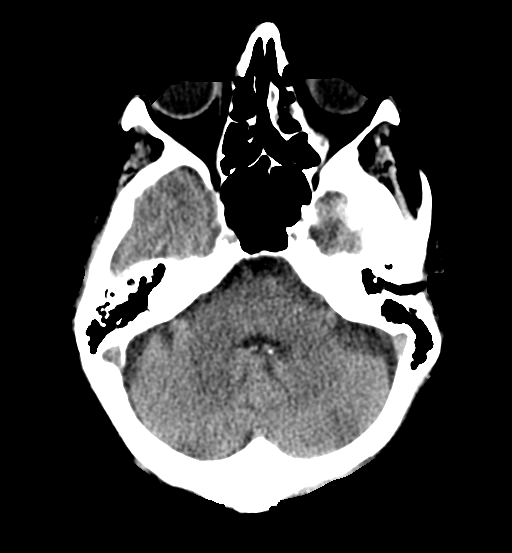
[im 17/38  brain]
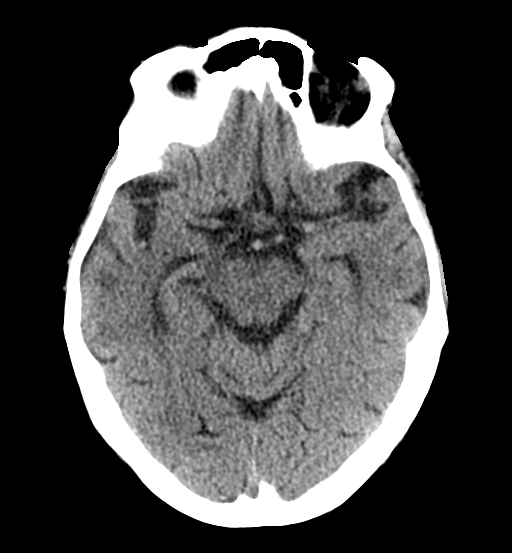
[im 21/38  brain]
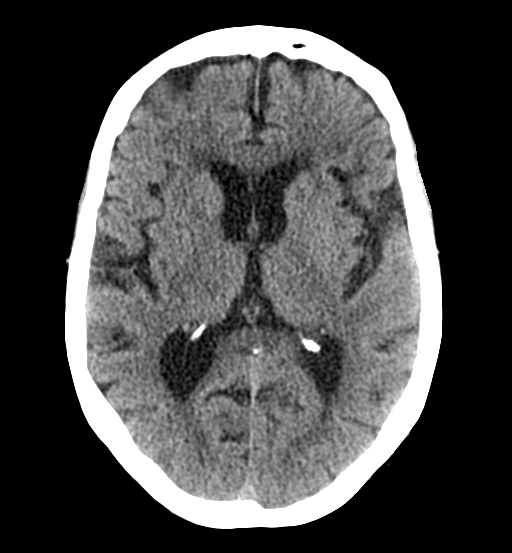
[im 21/38  bone]
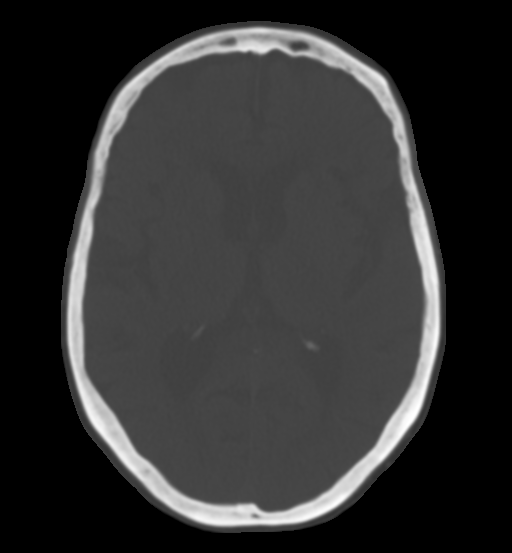
[im 25/38  brain]
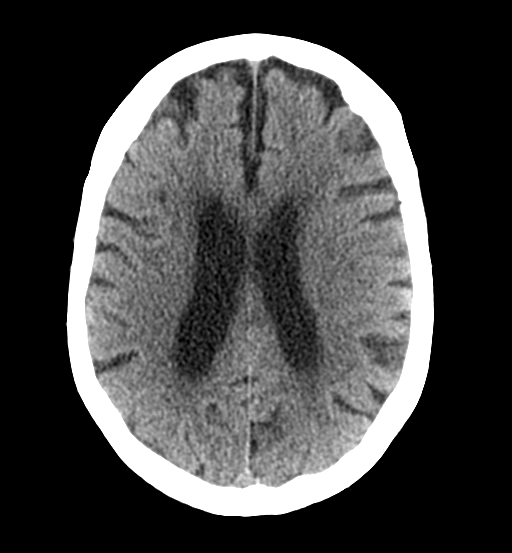
[im 29/38  brain]
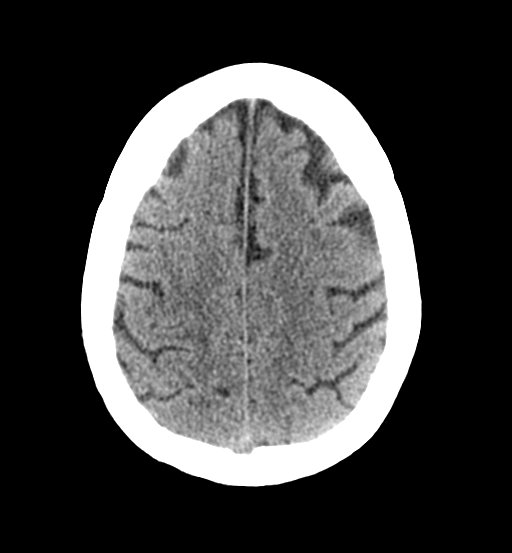
[im 33/38  brain]
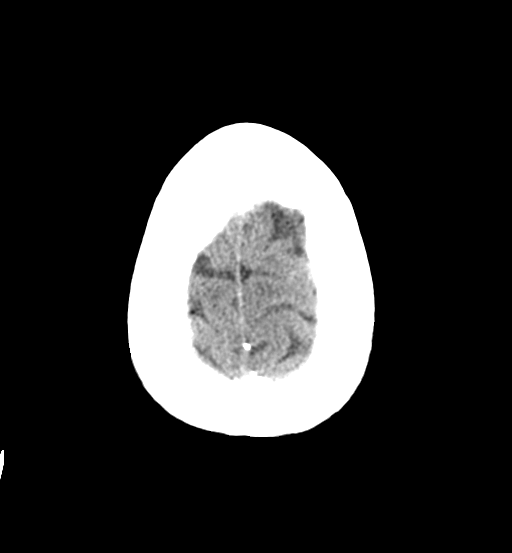

[Series 3: head bone · axial · 0.41mm/px · z∈[+330,+346]mm · 2 of 78 slices shown]
[im 9/78  bone]
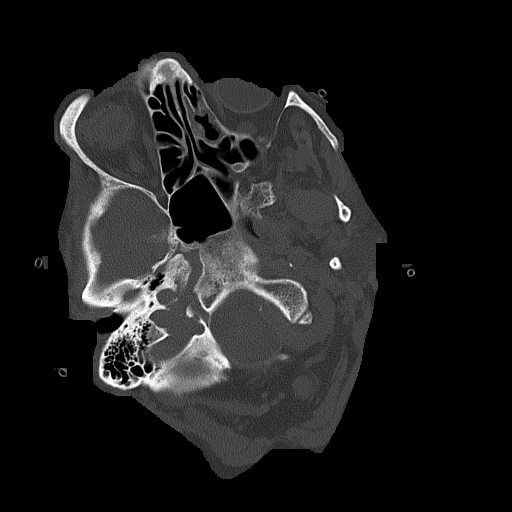
[im 17/78  bone]
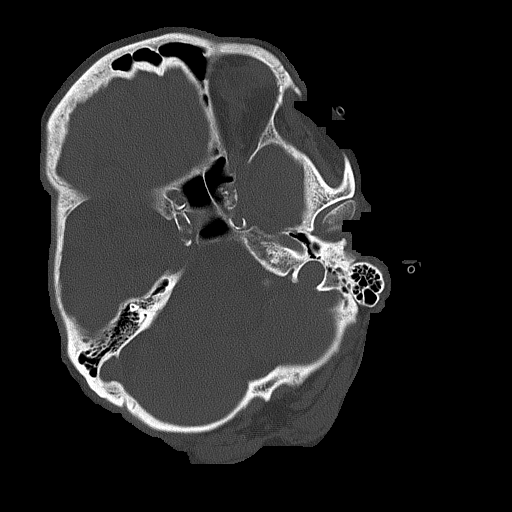

[Series 4: sagittal soft tissue · sagittal · 0.35mm/px · 3 of 57 slices shown]
[im 23/57  brain]
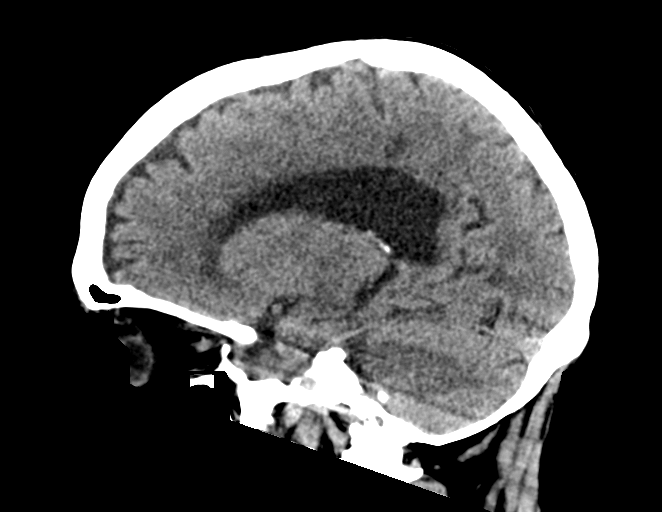
[im 29/57  brain]
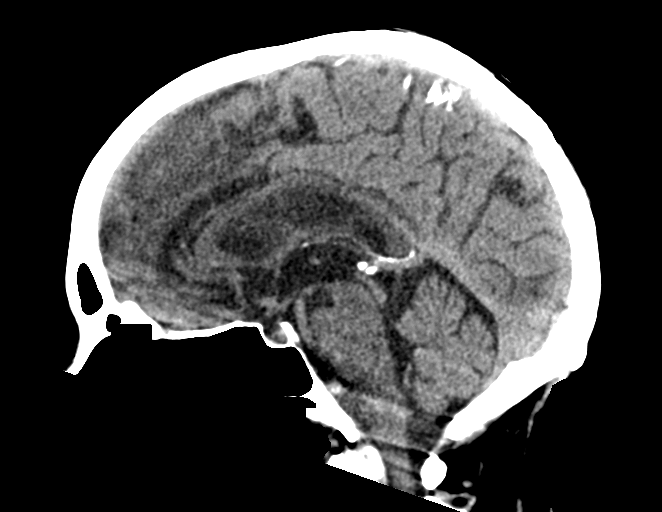
[im 34/57  brain]
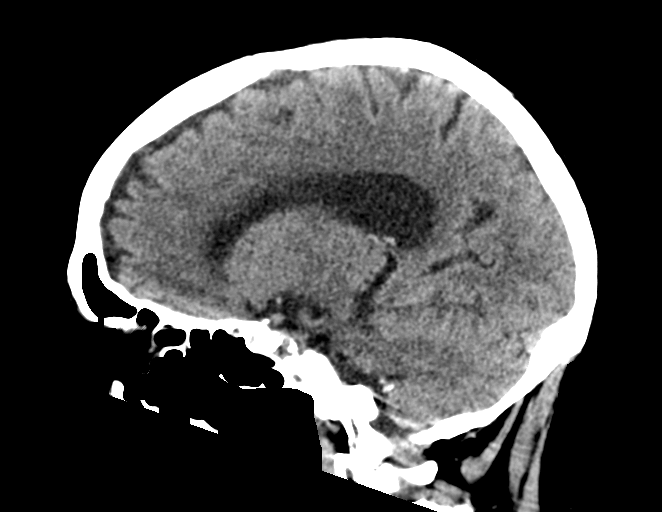

[Series 5: coronal soft tissue · coronal · 0.33mm/px · 3 of 70 slices shown]
[im 24/70  brain]
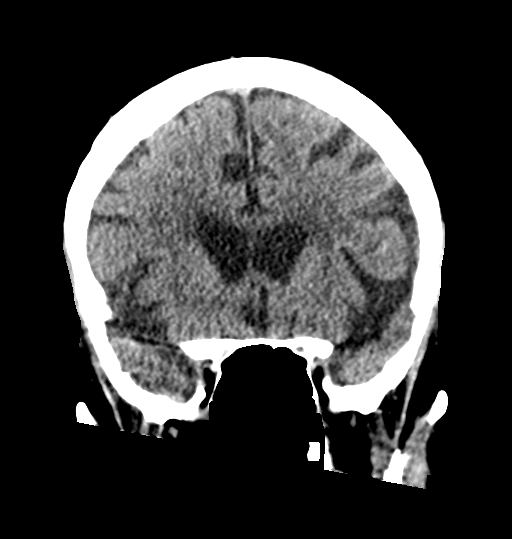
[im 31/70  brain]
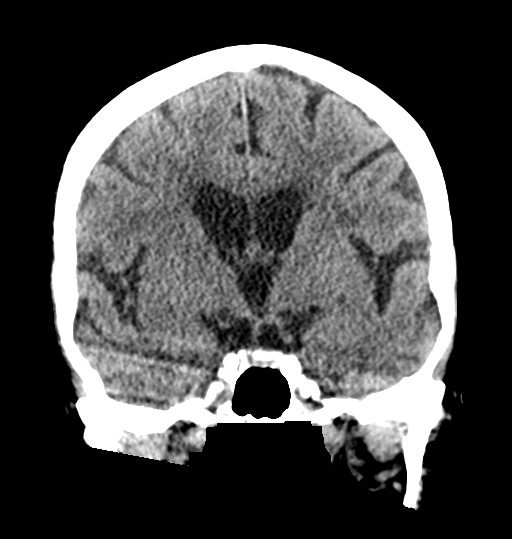
[im 39/70  brain]
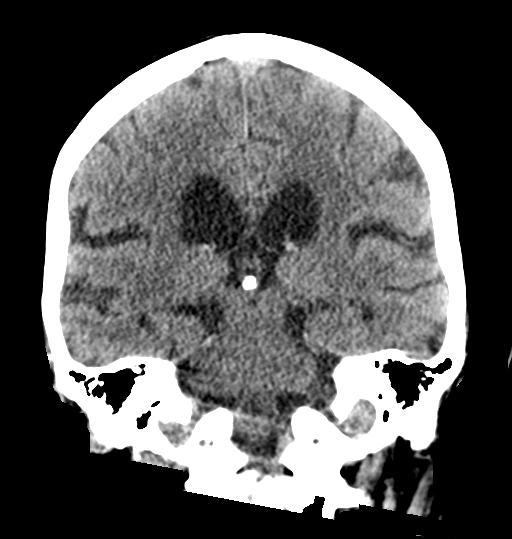

[16 of 47 positions shown; findings below may reference images not displayed]

FINDINGS: Brain: No evidence of acute infarction, hemorrhage, hydrocephalus,
extra-axial collection, visible mass lesion or mass effect.
Symmetric prominence of the ventricles, cisterns and sulci
compatible with parenchymal volume loss. Patchy areas of white
matter hypoattenuation are most compatible with chronic
microvascular angiopathy.

Vascular: Atherosclerotic calcification of the carotid siphons and
intradural vertebral arteries. No hyperdense vessel.

Skull: No calvarial fracture or suspicious osseous lesion. No scalp
swelling or hematoma.

Sinuses/Orbits: Layering air-fluid level seen in the maxillary
sinuses. More mild mural thickening in the ethmoids. Mastoid air
cells are clear. Middle ear cavities are clear. Included orbital
structures are unremarkable.

Other: None
IMPRESSION: No acute intracranial abnormality.

Background of intracranial atherosclerosis, microvascular angiopathy
and parenchymal volume loss.

Layering air-fluid levels in the maxillary sinuses, may correlate
for clinical features of acute sinusitis.

## 2020-11-13 IMAGING — DX DG CHEST 1V PORT
1 series · 1 of 1 positions shown · non-contrast
Comparison: [DATE] and chest CT [DATE].

CLINICAL DATA: Sepsis.

EXAM:
PORTABLE CHEST 1 VIEW

[chest ap]
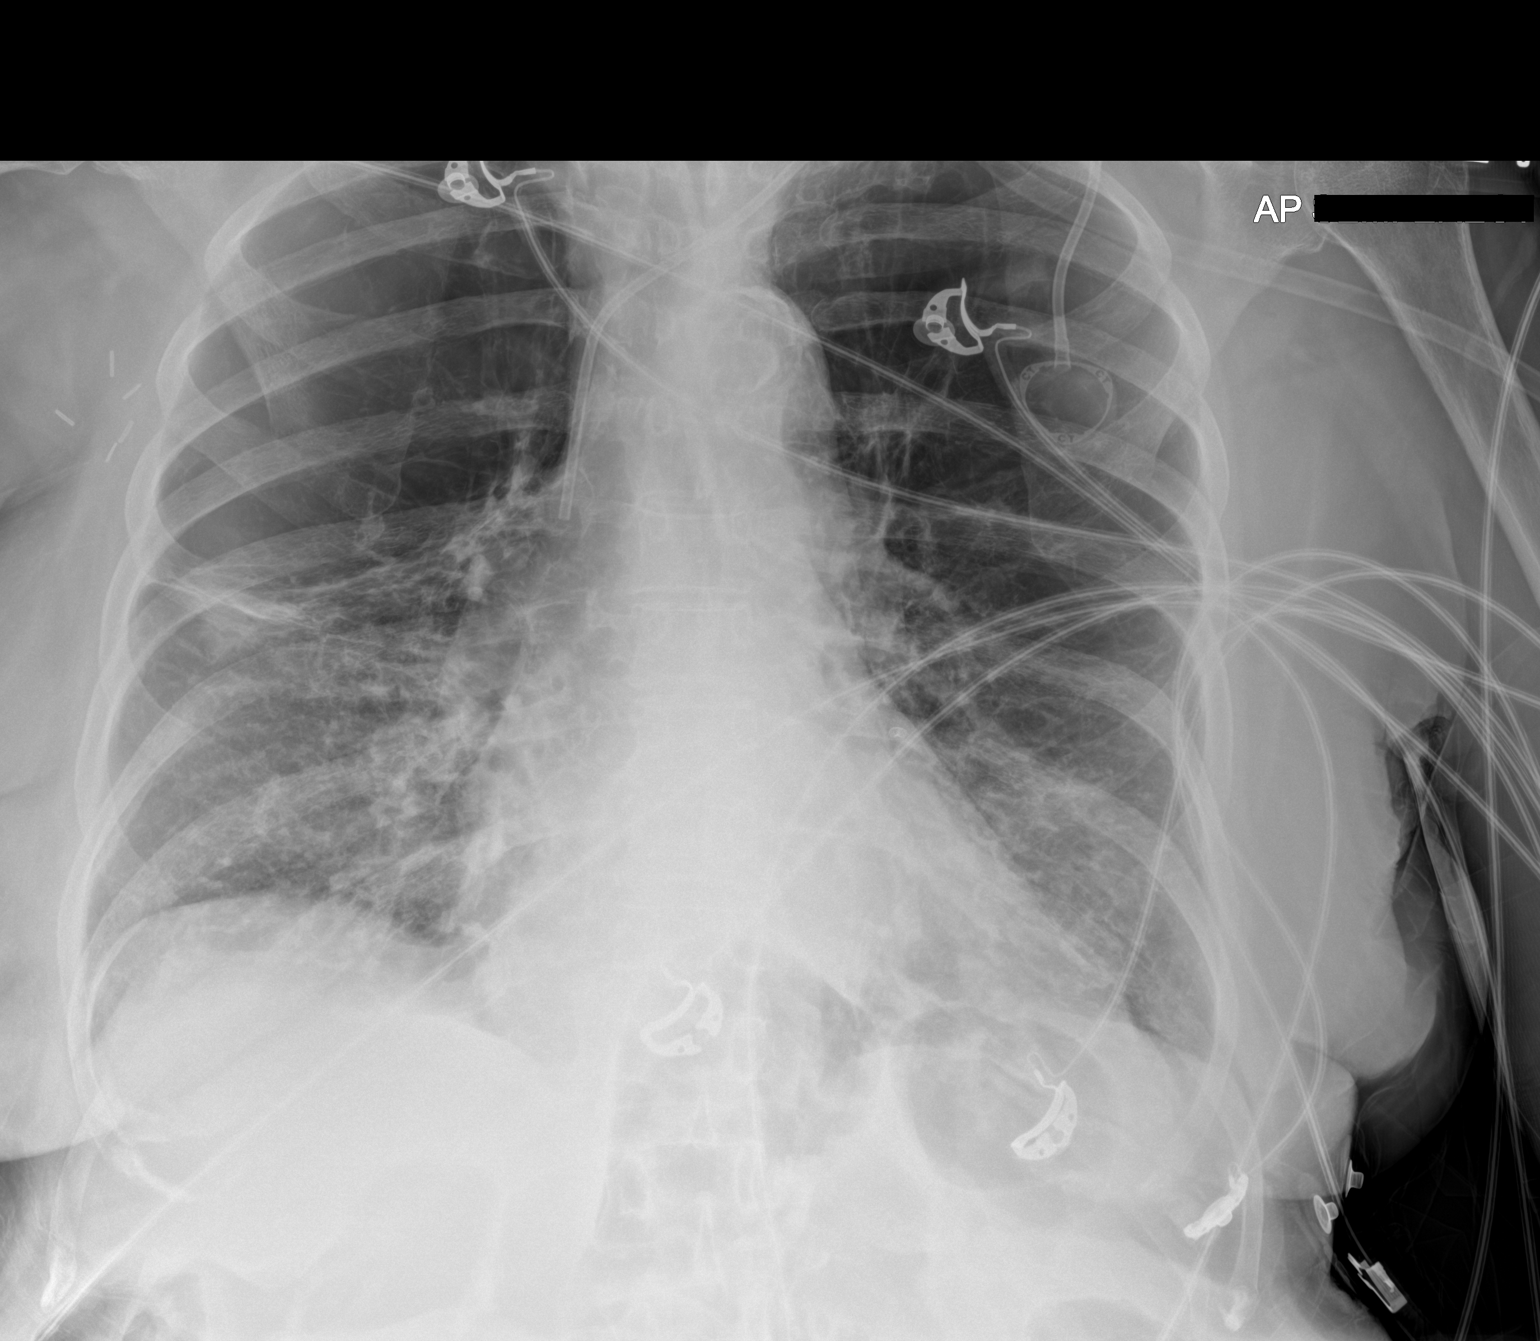

[1 of 1 positions shown; findings below may reference images not displayed]

FINDINGS: Again noted are severe emphysematous changes in the upper lungs.
Slightly increased interstitial lung densities in lower chest
bilaterally. New focal densities in the right mid lung region.
Stable position of left subclavian Port-A-Cath with tip in the SVC
region. Heart size is within normal limits and stable.
Atherosclerotic calcifications at the aortic arch. Surgical clips in
the right axilla.
IMPRESSION: 1. Severe emphysema with slightly increased interstitial densities
in the lower lungs and concern for more focal disease in the right
mid lung region. Differential diagnosis includes atypical infection
versus mild interstitial edema.
2. Stable position of the left subclavian Port-A-Cath.

## 2020-11-13 MED ORDER — LACTATED RINGERS IV SOLN: INTRAVENOUS | Status: AC

## 2020-11-13 MED ORDER — ROSUVASTATIN CALCIUM 10 MG PO TABS
20.0000 mg | ORAL_TABLET | Freq: Every evening | ORAL | Status: DC
  Administered 2020-11-13 – 2020-11-16 (×4): 20 mg via ORAL
  Filled 2020-11-13: qty 2
  Filled 2020-11-13 (×2): qty 1
  Filled 2020-11-13 (×2): qty 2

## 2020-11-13 MED ORDER — DULOXETINE HCL 30 MG PO CPEP
90.0000 mg | ORAL_CAPSULE | Freq: Every day | ORAL | Status: DC
  Administered 2020-11-13 – 2020-11-17 (×5): 90 mg via ORAL
  Filled 2020-11-13 (×5): qty 3

## 2020-11-13 MED ORDER — IPRATROPIUM-ALBUTEROL 0.5-2.5 (3) MG/3ML IN SOLN
3.0000 mL | Freq: Once | RESPIRATORY_TRACT | Status: AC
  Administered 2020-11-13: 3 mL via RESPIRATORY_TRACT
  Filled 2020-11-13: qty 3

## 2020-11-13 MED ORDER — PANTOPRAZOLE SODIUM 40 MG PO TBEC
40.0000 mg | DELAYED_RELEASE_TABLET | Freq: Every day | ORAL | Status: DC
  Administered 2020-11-13 – 2020-11-17 (×5): 40 mg via ORAL
  Filled 2020-11-13 (×5): qty 1

## 2020-11-13 MED ORDER — CLOPIDOGREL BISULFATE 75 MG PO TABS
75.0000 mg | ORAL_TABLET | Freq: Every day | ORAL | Status: DC
  Administered 2020-11-14 – 2020-11-17 (×4): 75 mg via ORAL
  Filled 2020-11-13 (×4): qty 1

## 2020-11-13 MED ORDER — LACTATED RINGERS IV SOLN: INTRAVENOUS | Status: DC

## 2020-11-13 MED ORDER — LETROZOLE 2.5 MG PO TABS
2.5000 mg | ORAL_TABLET | Freq: Every day | ORAL | Status: DC
  Administered 2020-11-13 – 2020-11-17 (×5): 2.5 mg via ORAL
  Filled 2020-11-13 (×5): qty 1

## 2020-11-13 MED ORDER — LORATADINE 10 MG PO TABS
5.0000 mg | ORAL_TABLET | Freq: Every day | ORAL | Status: DC
  Administered 2020-11-14 – 2020-11-17 (×4): 5 mg via ORAL
  Filled 2020-11-13 (×4): qty 1

## 2020-11-13 MED ORDER — UMECLIDINIUM-VILANTEROL 62.5-25 MCG/INH IN AEPB
1.0000 | INHALATION_SPRAY | Freq: Every day | RESPIRATORY_TRACT | Status: DC
  Administered 2020-11-14 – 2020-11-17 (×4): 1 via RESPIRATORY_TRACT
  Filled 2020-11-13: qty 14

## 2020-11-13 MED ORDER — LEVOFLOXACIN IN D5W 750 MG/150ML IV SOLN: 750.0000 mg | INTRAVENOUS | Status: DC

## 2020-11-13 MED ORDER — FLUTICASONE PROPIONATE 50 MCG/ACT NA SUSP
2.0000 | Freq: Two times a day (BID) | NASAL | Status: DC | PRN
  Filled 2020-11-13: qty 16

## 2020-11-13 MED ORDER — SODIUM CHLORIDE 0.9 % IV BOLUS
1000.0000 mL | Freq: Once | INTRAVENOUS | Status: AC
  Administered 2020-11-13: 1000 mL via INTRAVENOUS

## 2020-11-13 MED ORDER — LEVOFLOXACIN IN D5W 500 MG/100ML IV SOLN
500.0000 mg | INTRAVENOUS | Status: AC
Start: 2020-11-15 — End: 2020-11-17
  Administered 2020-11-15 – 2020-11-17 (×2): 500 mg via INTRAVENOUS
  Filled 2020-11-13 (×2): qty 100

## 2020-11-13 MED ORDER — MELATONIN 5 MG PO TABS: 10.0000 mg | ORAL_TABLET | Freq: Every evening | ORAL | Status: DC | PRN

## 2020-11-13 MED ORDER — HEPARIN SODIUM (PORCINE) 5000 UNIT/ML IJ SOLN
5000.0000 [IU] | Freq: Three times a day (TID) | INTRAMUSCULAR | Status: DC
  Administered 2020-11-14 – 2020-11-17 (×9): 5000 [IU] via SUBCUTANEOUS
  Filled 2020-11-13 (×10): qty 1

## 2020-11-13 MED ORDER — MAGNESIUM OXIDE -MG SUPPLEMENT 400 (240 MG) MG PO TABS
200.0000 mg | ORAL_TABLET | Freq: Every day | ORAL | Status: DC
  Administered 2020-11-13 – 2020-11-17 (×5): 200 mg via ORAL
  Filled 2020-11-13 (×5): qty 1

## 2020-11-13 MED ORDER — LEVOFLOXACIN IN D5W 750 MG/150ML IV SOLN
750.0000 mg | Freq: Once | INTRAVENOUS | Status: AC
  Administered 2020-11-13: 750 mg via INTRAVENOUS
  Filled 2020-11-13: qty 150

## 2020-11-13 MED ORDER — COENZYME Q10 300 MG PO CAPS: 300.0000 mg | ORAL_CAPSULE | Freq: Every day | ORAL | Status: DC

## 2020-11-13 MED ORDER — GUAIFENESIN ER 600 MG PO TB12
600.0000 mg | ORAL_TABLET | Freq: Two times a day (BID) | ORAL | Status: DC
  Administered 2020-11-13 – 2020-11-17 (×8): 600 mg via ORAL
  Filled 2020-11-13 (×8): qty 1

## 2020-11-13 MED ORDER — CARVEDILOL 6.25 MG PO TABS
6.2500 mg | ORAL_TABLET | Freq: Two times a day (BID) | ORAL | Status: DC
  Administered 2020-11-13 – 2020-11-17 (×8): 6.25 mg via ORAL
  Filled 2020-11-13 (×8): qty 1

## 2020-11-13 MED ORDER — ALBUTEROL SULFATE (2.5 MG/3ML) 0.083% IN NEBU
2.5000 mg | INHALATION_SOLUTION | Freq: Once | RESPIRATORY_TRACT | Status: AC
  Administered 2020-11-13: 2.5 mg via RESPIRATORY_TRACT
  Filled 2020-11-13: qty 3

## 2020-11-13 MED ORDER — VITAMIN D 25 MCG (1000 UNIT) PO TABS
1000.0000 [IU] | ORAL_TABLET | Freq: Every day | ORAL | Status: DC
  Administered 2020-11-14 – 2020-11-17 (×4): 1000 [IU] via ORAL
  Filled 2020-11-13 (×4): qty 1

## 2020-11-13 MED ORDER — IPRATROPIUM-ALBUTEROL 0.5-2.5 (3) MG/3ML IN SOLN
3.0000 mL | Freq: Four times a day (QID) | RESPIRATORY_TRACT | Status: DC
  Administered 2020-11-13 – 2020-11-14 (×5): 3 mL via RESPIRATORY_TRACT
  Filled 2020-11-13 (×5): qty 3

## 2020-11-13 MED ORDER — MONTELUKAST SODIUM 10 MG PO TABS
10.0000 mg | ORAL_TABLET | Freq: Every day | ORAL | Status: DC
  Administered 2020-11-13 – 2020-11-16 (×4): 10 mg via ORAL
  Filled 2020-11-13 (×5): qty 1

## 2020-11-13 MED ORDER — METHYLPREDNISOLONE SODIUM SUCC 125 MG IJ SOLR
80.0000 mg | Freq: Four times a day (QID) | INTRAMUSCULAR | Status: AC
Start: 2020-11-13 — End: 2020-11-14
  Administered 2020-11-13 – 2020-11-14 (×3): 80 mg via INTRAVENOUS
  Filled 2020-11-13 (×3): qty 2

## 2020-11-13 MED ORDER — PREDNISONE 20 MG PO TABS
40.0000 mg | ORAL_TABLET | Freq: Every day | ORAL | Status: DC
  Administered 2020-11-15 – 2020-11-17 (×3): 40 mg via ORAL
  Filled 2020-11-13 (×3): qty 2

## 2020-11-13 MED ORDER — DULOXETINE HCL 60 MG PO CPEP: 60.0000 mg | ORAL_CAPSULE | Freq: Every day | ORAL | Status: DC

## 2020-11-13 NOTE — H&P (Addendum)
History and Physical    Krista Cisneros EPP:[id number] DOB: 05/22/1940 DOA: 11/13/2020  PCP: Sharyne Peach, MD  Chief Complaint: Generalized weakness, chills, shortness of breath  HPI: Krista Cisneros is a 80 y.o. female with a past medical history of COPD/severe emphysema not on home oxygen supplementation, coronary artery disease status post PCI, CHF with reduced ejection fraction, hypertension, hyperlipidemia, depression/anxiety, history of recurrent pneumonia, former tobacco user, history of right breast cancer, GERD.  The patient presented to the emergency department due to generalized weakness, some chills.  She has been feeling short of breath for the past few days.  Also having worsening dry cough.  She was satting 86% when EMS arrived.  89% on room air in the emergency department.  Now on 2 L and saturating in the mid 90s.  Granddaughter is present at bedside.  No fevers.  She had a fall secondary to generalized weakness today and fell on her side and hit the bridge of her nose as well.  She is having shortness of breath at rest.  Worsened with exertion.  No alleviating factors.  She has received the COVID-19 vaccine and booster.  Last pneumonia was approximately 2 months ago.  En-route via EMS she received 125 mg of Solu-Medrol, 2 nebulizer treatments with duo nebs.  Upon my evaluation she is not in any respiratory distress.  She was getting home health physical therapy and was doing well with it but recently after she was progressing well the granddaughter states that it was discontinued.    ED Course: Lab work obtained.  Chest x-ray obtained.  Albuterol 2.5 mg neb treatment x2, DuoNeb 3 mL neb treatment x1, normal saline 1 L bolus.  Levaquin 750 mg IV x1.  Blood cultures also obtained.  Review of Systems: 14 point review of systems is negative except for what is mentioned above in the HPI.   Past Medical History:  Diagnosis Date   Anxiety    Arthritis    CAD (coronary  artery disease)    a. NSTEMI 12/19; b. LHC 04/10/18: pLAD 95%, mLAD 80%, m-dLCx 95%, OM3-1 lesion 40%, OM3-2 lesion 60%, mRCA 50%, EF 35-45%, successful PCI/DES x 2 to the LAD with recommended staged PCI of the LCx in a few weeks   Cancer (HCC)    Right Breast Cancer   Chronic systolic CHF (congestive heart failure) ([place])    a. TTE 12/19: EF 30-35%, anteroseptal, anterior, and apical HK, Gr1DD, mild to mod MR, mildly dilated LA, RVSF nl   COPD (chronic obstructive pulmonary disease) (HCC)    Depression    Dyspnea    with exertion   GERD (gastroesophageal reflux disease)    Hyperlipidemia    Hypertension    Personal history of chemotherapy 2018   chemo prior to lumpectomy of right breast   Personal history of radiation therapy 2019   right breast ca    Past Surgical History:  Procedure Laterality Date   ABDOMINAL HYSTERECTOMY  1990   Partial   BREAST BIOPSY Right 10/04/2016   axilla lymph node (metastatic carcinoma) and axillay tail mass biopsy-invasive mammary carcinoma   BREAST LUMPECTOMY Right 03/21/2017   chemo first, [facility] and metastatic LN   BREAST LUMPECTOMY Right 05/21/2017   re-excision for clip   BREAST LUMPECTOMY WITH NEEDLE LOCALIZATION Right 03/21/2017   Procedure: BREAST LUMPECTOMY WITH NEEDLE LOCALIZATION;  Surgeon: Clayburn Pert, MD;  Location: [facility];  Service: General;  Laterality: Right;   CORONARY STENT INTERVENTION N/A 04/10/2018  Procedure: CORONARY STENT INTERVENTION;  Surgeon: Wellington Hampshire, MD;  Location: [place] [facility];  Service: Cardiovascular;  Laterality: N/A;   CORONARY STENT INTERVENTION N/A 05/14/2018   Procedure: CORONARY STENT INTERVENTION;  Surgeon: Nelva Bush, MD;  Location: [place] [facility];  Service: Cardiovascular;  Laterality: N/A;   DILATION AND CURETTAGE OF UTERUS     INGUINAL HERNIA REPAIR Right 03/26/2017   Procedure: HERNIA REPAIR INGUINAL INCARCERATED;  Surgeon: Jules Husbands, MD;  Location: [facility];  Service:  General;  Laterality: Right;   LEFT HEART CATH AND CORONARY ANGIOGRAPHY N/A 04/10/2018   Procedure: LEFT HEART CATH AND CORONARY ANGIOGRAPHY poss PCI;  Surgeon: Minna Merritts, MD;  Location: [place] [facility];  Service: Cardiovascular;  Laterality: N/A;   PORTACATH PLACEMENT Left 10/24/2016   Procedure: INSERTION PORT-A-CATH;  Surgeon: [name], MD;  Location: [facility];  Service: General;  Laterality: Left;   RE-EXCISION OF BREAST LUMPECTOMY Right 05/21/2017   Procedure: RE-EXCISION OF BREAST LUMPECTOMY;  Surgeon: Clayburn Pert, MD;  Location: [facility];  Service: General;  Laterality: Right;   SENTINEL NODE BIOPSY Right 03/21/2017   Procedure: SENTINEL NODE BIOPSY;  Surgeon: Clayburn Pert, MD;  Location: [facility];  Service: General;  Laterality: Right;    Social History   Socioeconomic History   Marital status: Widowed    Spouse name: Not on file   Number of children: Not on file   Years of education: Not on file   Highest education level: Not on file  Occupational History   Not on file  Tobacco Use   Smoking status: Former    Packs/day: 0.50    Types: Cigarettes    Quit date: 07/23/1988    Years since quitting: 32.3   Smokeless tobacco: Never  Vaping Use   Vaping Use: Never used  Substance and Sexual Activity   Alcohol use: Yes    Comment: rare   Drug use: No   Sexual activity: Not Currently    Birth control/protection: None  Other Topics Concern   Not on file  Social History Narrative   Not on file   Social Determinants of Health   Financial Resource Strain: Not on file  Food Insecurity: Not on file  Transportation Needs: Not on file  Physical Activity: Not on file  Stress: Not on file  Social Connections: Not on file  Intimate Partner Violence: Not on file    Allergies  Allergen Reactions   Nsaids     Bleeding risk   Penicillins Anaphylaxis, Swelling and Rash    Has patient had a PCN reaction causing immediate rash, facial/tongue/throat  swelling, SOB or lightheadedness with hypotension: Yes Has patient had a PCN reaction causing severe rash involving mucus membranes or skin necrosis: No Has patient had a PCN reaction that required hospitalization: No  Has patient had a PCN reaction occurring within the last 10 years: No If all of the above answers are "NO", then may proceed with Cephalosporin use.    Tamsulosin Nausea Only   Atorvastatin Other (See Comments)    Muscle Pain   Gabapentin Swelling   Ezetimibe Other (See Comments)    Muscle pain   Aspirin     GI bleeding risk    Family History  Problem Relation Age of Onset   Colon cancer Mother    Breast cancer Neg Hx     Prior to Admission medications   Medication Sig Start Date End Date Taking? Authorizing Provider  albuterol (PROVENTIL HFA;VENTOLIN  HFA) 108 (90 Base) MCG/ACT inhaler Inhale 2 puffs into the lungs every 6 (six) hours as needed for wheezing or shortness of breath.    [provider]  baclofen (LIORESAL) 10 MG tablet Take 10 mg by mouth at bedtime. 01/14/17   [provider]  BREO ELLIPTA 200-25 MCG/INH AEPB daily. 09/09/20   [provider]  carvedilol (COREG) 3.125 MG tablet Take 2 tablets (6.25 mg total) by mouth 2 (two) times daily. 10/04/20   Minna Merritts, MD  cholecalciferol (VITAMIN D3) 25 MCG (1000 UT) tablet Take 1,000 Units by mouth daily.    [provider]  clopidogrel (PLAVIX) 75 MG tablet TAKE 1 TABLET (75 MG TOTAL) BY MOUTH DAILY WITH BREAKFAST. 10/05/20   Minna Merritts, MD  clopidogrel (PLAVIX) 75 MG tablet Take 1 tablet (75 mg total) by mouth daily with breakfast. 10/04/20   Minna Merritts, MD  Coenzyme Q10 300 MG CAPS Take 300 mg by mouth daily.     [provider]  DULoxetine (CYMBALTA) 30 MG capsule Take 30 mg by mouth every morning. Takes with 60 mg for a total of 48m 05/04/17   [provider]  DULoxetine (CYMBALTA) 60 MG capsule Take 60 mg by mouth daily. Takes with 337m for a total of 9064m  [provider]  enalapril (VASOTEC) 5 MG tablet Take 1 tablet (5 mg total) by mouth daily. 06/02/20   WalLoel DubonnetP  fexofenadine (ALLEGRA) 180 MG tablet Take 180 mg by mouth daily. 09/14/20   [provider]  fluticasone (FLONASE) 50 MCG/ACT nasal spray Place 2 sprays into the nose daily. 05/23/19 11/08/20  [provider]  furosemide (LASIX) 20 MG tablet Take 1 tablet (20 mg total) by mouth daily as needed for edema. 10/04/20   GolMinna MerrittsD  guaiFENesin-dextromethorphan (ROBITUSSIN DM) 100-10 MG/5ML syrup Take 5 mLs by mouth every 4 (four) hours as needed for cough. 06/20/20   SreSidney AceD  letrozole (FEMARA) 2.5 MG tablet TAKE 1 TABLET BY MOUTH EVERY DAY 03/22/20   AllVerlon AuP  lidocaine-prilocaine (EMLA) cream Apply 1 application as needed topically (for port access).    [provider]  magnesium oxide (MAG-OX) 400 MG tablet Take 400 mg by mouth daily.    [provider]  Melatonin 5 MG TABS Take 10 mg by mouth at bedtime as needed (sleep).    [provider]  montelukast (SINGULAIR) 10 MG tablet Take 10 mg by mouth at bedtime.  05/23/16   [provider]  nitroGLYCERIN (NITROSTAT) 0.4 MG SL tablet Place 1 tablet (0.4 mg total) under the tongue every 5 (five) minutes as needed for chest pain. Patient not taking: Reported on 11/08/2020 09/03/19 10/04/20  WalLoel DubonnetP  pantoprazole (PROTONIX) 40 MG tablet TAKE 1 TABLET BY MOUTH EVERY DAY 09/14/20   GolMinna MerrittsD  rosuvastatin (CRESTOR) 20 MG tablet TAKE 1 TABLET BY MOUTH EVERY DAY AT 6PM 09/14/20   GolMinna MerrittsD  spironolactone (ALDACTONE) 25 MG tablet Take 0.5 tablets (12.5 mg total) by mouth daily. 10/04/20   GolMinna MerrittsD  tiotropium (SPIRIVA) 18 MCG inhalation capsule Place into inhaler and inhale. 08/18/20 08/18/21  [provider]  prochlorperazine (COMPAZINE) 10 MG tablet Take 1 tablet (10 mg  total) by mouth every 6 (six) hours as needed (Nausea or vomiting). 10/20/16 07/31/17  FinLloyd HugerD    Physical Exam: Vitals:  11/13/20 1251 11/13/20 1330 11/13/20 1400 11/13/20 1430  BP: 108/61 (!) 102/49 111/60 (!) 100/57  Pulse: 92 91 87 87  Resp: (!) 26 20 (!) 25 (!) 24  Temp:      SpO2: 93% 93% 91% 94%  Weight:      Height:         General:  Appears calm and comfortable and is in NAD Cardiovascular:  RRR, no m/r/g.  Respiratory:   Diffuse expiratory wheezing and dry cough noted at bedside Abdomen:  soft, NT, ND, NABS Skin:  no rash or induration seen on limited exam Musculoskeletal:  grossly normal tone BUE/BLE, good ROM, no bony abnormality Lower extremity:  No LE edema.  Limited foot exam with no ulcerations.  2+ distal pulses. Psychiatric:  grossly normal mood and affect, speech fluent and appropriate, AOx3 Neurologic:  CN 2-12 grossly intact, moves all extremities in coordinated fashion, sensation intact    Radiological Exams on Admission: Independently reviewed - see discussion in A/P where applicable  DG Chest Port 1 View  Result Date: 11/13/2020 CLINICAL DATA:  Sepsis. EXAM: PORTABLE CHEST 1 VIEW COMPARISON:  08/18/2020 and chest CT 06/17/2020. FINDINGS: Again noted are severe emphysematous changes in the upper lungs. Slightly increased interstitial lung densities in lower chest bilaterally. New focal densities in the right mid lung region. Stable position of left subclavian Port-A-Cath with tip in the SVC region. Heart size is within normal limits and stable. Atherosclerotic calcifications at the aortic arch. Surgical clips in the right axilla. IMPRESSION: 1. Severe emphysema with slightly increased interstitial densities in the lower lungs and concern for more focal disease in the right mid lung region. Differential diagnosis includes atypical infection versus mild interstitial edema. 2. Stable position of the left subclavian Port-A-Cath. Electronically Signed    By: Markus Daft M.D.   On: 11/13/2020 13:34    EKG: Independently reviewed.  Normal sinus rhythm rate 90   Labs on Admission: I have personally reviewed the available labs and imaging studies at the time of the admission.  Pertinent labs: Sodium 4, chloride 115, bicarb 20, BUN 61, creatinine 2.18, albumin 2.7, AST 57, WBC 17.5, lactic acid 1.2.     Assessment/Plan: Acute hypoxic respiratory failure secondary to COPD/severe emphysema exacerbation and community-acquired pneumonia: The patient will be admitted to the medical/surgical floor under inpatient status.  The patient has an allergy to penicillin and cephalosporins.  Given Levaquin 750 mg IV x1 in the emergency department.  We will continue with Levaquin for antimicrobial therapy.  Aggressive pulmonary toilet including chest PT, incentive spirometry and flutter valve.  Scheduled Duonebs and as needed.  IV steroids.  Wean oxygen as tolerated.  Anoro Ellipta ordered as well.  She meets hypoxic respiratory failure criteria met saturating 86% when EMS arrived and 89% on room air via pulse ox in the emergency department.  COVID-19/influenza and RSV PCR were negative.  We will obtain a respiratory panel for other viral pathogens.  Sputum culture ordered.  Sepsis secondary to the above: She meets sepsis criteria with leukocytosis, and mild tachycardia.  Plan as above.  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  Baseline appears to be around 1.2-1.6.  She denies any flank pain or abdominal pain.  No known history of nephrolithiasis.  Clinically she looks dry.  Given 1 L normal saline bolus in the emergency department and we will continue with lactated Ringer's at 75 cc an hour x1 L bag.  History of breast  cancer: Continue home Femara 2.5 mg daily  CHF with reduced ejection fraction: Not in acute decompensation.  Continue home Coreg 3.125 mg twice daily.  Holding home Aldactone 12.5 mg daily and  Lasix 20 mg daily as needed for edema due to the acute kidney injury.  Essential hypertension: Holding home Vasotec 5 mg daily due to the acute kidney injury.  Hyperlipidemia: Continue home Crestor 20 mg daily  Coronary artery disease status post PCI: Continue home Plavix 75 mg daily  Depression/anxiety: Continue home duloxetine 90 mg daily  GERD: Continue home Protonix 40 mg daily  Level of Care: MedSurg DVT prophylaxis: Heparin subcu Code Status: Full code Consults: None Admission status: Inpatient   Leslee Home DO [facility]   How to contact the [facility] Attending or Consulting provider [place] or covering provider during after hours [place], for this patient?  Check the care team in [facility] and look for a) attending/consulting [facility] provider listed and b) the [facility] team listed Log into [email or web address] and use [place]'s universal password to access. If you do not have the password, please contact the hospital operator. Locate the [facility] provider you are looking for under [facility] and page to a number that you can be directly reached. If you still have difficulty reaching the provider, please page the [facility] (Director on Call) for the Hospitalists listed on amion for assistance.   11/13/2020, 5:17 PM

## 2020-11-13 NOTE — ED Triage Notes (Signed)
Patient BIBA from home. Patient called EMS d/t fall and shortness of breath. Reports she has been feeling weak. Patient reports hx of recent PNA. Patient A&OX3.    EMS: 22 G Left arm 2 duoneb treatments '125mg'$  solumedrol BGL 144

## 2020-11-13 NOTE — ED Provider Notes (Signed)
[facility] Emergency Department Provider Note  ____________________________________________   Event Date/Time   First MD Initiated Contact with Patient 11/13/20 1247     (approximate)  I have reviewed the triage vital signs and the nursing notes.   HISTORY  Chief Complaint Shortness of Breath    HPI Krista Cisneros is a 81 y.o. female with past medical history of CAD, COPD, here with shortness of breath.  The patient has a history of recurrent pneumonia.  She states over the last several days, she has had progressive worsening cough, shortness of breath, and wheezing.  She feels similar to her previous episodes of pneumonia.  She said chills.  She said decreased appetite.  Shortness of breath was initially with exertion but is now at rest.  She said increasing yellow-green sputum production.  No chest pain.  No known sick contacts.  Nursing medication changes.  She is been trying to use her inhalers without significant relief.  No alleviating factors.    Past Medical History:  Diagnosis Date   Anxiety    Arthritis    CAD (coronary artery disease)    a. NSTEMI 12/19; b. LHC 04/10/18: pLAD 95%, mLAD 80%, m-dLCx 95%, OM3-1 lesion 40%, OM3-2 lesion 60%, mRCA 50%, EF 35-45%, successful PCI/DES x 2 to the LAD with recommended staged PCI of the LCx in a few weeks   Cancer (HCC)    Right Breast Cancer   Chronic systolic CHF (congestive heart failure) ([place])    a. TTE 12/19: EF 30-35%, anteroseptal, anterior, and apical HK, Gr1DD, mild to mod MR, mildly dilated LA, RVSF nl   COPD (chronic obstructive pulmonary disease) (HCC)    Depression    Dyspnea    with exertion   GERD (gastroesophageal reflux disease)    Hyperlipidemia    Hypertension    Personal history of chemotherapy 2018   chemo prior to lumpectomy of right breast   Personal history of radiation therapy 2019   right breast ca    Patient Active Problem List   Diagnosis Date Noted   COPD  with acute exacerbation ([place]) 11/13/2020   CAP (community acquired pneumonia) 06/17/2020   Generalized weakness 06/17/2020   History of COVID-19, December 2021 06/17/2020   History of breast cancer 06/17/2020   Chronic diastolic CHF (congestive heart failure) ([place]) 06/17/2020   Stage 3a chronic kidney disease ([place]) 06/17/2020   Sepsis due to Escherichia coli (E. coli) ([place]) 10/10/2019   Acute lower UTI 10/10/2019   SIRS (systemic inflammatory response syndrome) ([place]) 10/07/2019   Constipation 10/07/2019   Urinary retention 10/07/2019   Iron deficiency anemia 05/30/2019   Ischemic cardiomyopathy 06/03/2018   Coronary artery disease involving native coronary artery of native heart with angina pectoris ([place]) [id number]   Chronic systolic heart failure (HCC)    Coronary artery disease involving native coronary artery of native heart without angina pectoris 04/22/2018   Non-STEMI (non-ST elevated myocardial infarction) ([place]) 04/22/2018   NSTEMI (non-ST elevated myocardial infarction) ([place]) 04/09/2018   Rectal bleeding 03/22/2018   Malignant neoplasm of upper-outer quadrant of right breast in female, estrogen receptor negative ([place]) 06/19/2017   Incarcerated hernia 03/26/2017   Incarcerated inguinal hernia    Acute kidney injury ([place]) 02/07/2017   Hypomagnesemia 01/30/2017   Goals of care, counseling/discussion 10/20/2016   Malignant neoplasm of right female breast ([place]) 10/16/2016   Hyperlipidemia, mixed 10/08/2016   Chronic venous insufficiency 09/14/2016   GERD (gastroesophageal reflux disease) 09/14/2016   Pain in limb  05/29/2016   Essential hypertension 05/29/2016   COPD (chronic obstructive pulmonary disease) ([place]) 05/29/2016   Hardening of the aorta (main artery of the heart) ([place]) 01/07/2016   Hyperglycemia, unspecified 01/07/2016   Neuropathy 11/15/2015   Severe recurrent major depression without psychotic features ([place]) 09/10/2015   Allergic rhinitis 04/10/2014   Depression  04/10/2014   Obesity, unspecified 04/10/2014    Past Surgical History:  Procedure Laterality Date   ABDOMINAL HYSTERECTOMY  1990   Partial   BREAST BIOPSY Right 10/04/2016   axilla lymph node (metastatic carcinoma) and axillay tail mass biopsy-invasive mammary carcinoma   BREAST LUMPECTOMY Right 03/21/2017   chemo first, [facility] and metastatic LN   BREAST LUMPECTOMY Right 05/21/2017   re-excision for clip   BREAST LUMPECTOMY WITH NEEDLE LOCALIZATION Right 03/21/2017   Procedure: BREAST LUMPECTOMY WITH NEEDLE LOCALIZATION;  Surgeon: Clayburn Pert, MD;  Location: [facility];  Service: General;  Laterality: Right;   CORONARY STENT INTERVENTION N/A 04/10/2018   Procedure: CORONARY STENT INTERVENTION;  Surgeon: Wellington Hampshire, MD;  Location: [place] [facility];  Service: Cardiovascular;  Laterality: N/A;   CORONARY STENT INTERVENTION N/A 05/14/2018   Procedure: CORONARY STENT INTERVENTION;  Surgeon: Nelva Bush, MD;  Location: [place] [facility];  Service: Cardiovascular;  Laterality: N/A;   DILATION AND CURETTAGE OF UTERUS     INGUINAL HERNIA REPAIR Right 03/26/2017   Procedure: HERNIA REPAIR INGUINAL INCARCERATED;  Surgeon: Jules Husbands, MD;  Location: [facility];  Service: General;  Laterality: Right;   LEFT HEART CATH AND CORONARY ANGIOGRAPHY N/A 04/10/2018   Procedure: LEFT HEART CATH AND CORONARY ANGIOGRAPHY poss PCI;  Surgeon: Minna Merritts, MD;  Location: [place] [facility];  Service: Cardiovascular;  Laterality: N/A;   PORTACATH PLACEMENT Left 10/24/2016   Procedure: INSERTION PORT-A-CATH;  Surgeon: [name], MD;  Location: [facility];  Service: General;  Laterality: Left;   RE-EXCISION OF BREAST LUMPECTOMY Right 05/21/2017   Procedure: RE-EXCISION OF BREAST LUMPECTOMY;  Surgeon: Clayburn Pert, MD;  Location: [facility];  Service: General;  Laterality: Right;   SENTINEL NODE BIOPSY Right 03/21/2017   Procedure: SENTINEL NODE BIOPSY;  Surgeon: Clayburn Pert, MD;   Location: [facility];  Service: General;  Laterality: Right;    Prior to Admission medications   Medication Sig Start Date End Date Taking? Authorizing Provider  albuterol (PROVENTIL HFA;VENTOLIN HFA) 108 (90 Base) MCG/ACT inhaler Inhale 2 puffs into the lungs every 6 (six) hours as needed for wheezing or shortness of breath.    [provider]  baclofen (LIORESAL) 10 MG tablet Take 10 mg by mouth at bedtime. 01/14/17   [provider]  BREO ELLIPTA 200-25 MCG/INH AEPB daily. 09/09/20   [provider]  carvedilol (COREG) 3.125 MG tablet Take 2 tablets (6.25 mg total) by mouth 2 (two) times daily. 10/04/20   Minna Merritts, MD  cholecalciferol (VITAMIN D3) 25 MCG (1000 UT) tablet Take 1,000 Units by mouth daily.    [provider]  clopidogrel (PLAVIX) 75 MG tablet TAKE 1 TABLET (75 MG TOTAL) BY MOUTH DAILY WITH BREAKFAST. 10/05/20   Minna Merritts, MD  clopidogrel (PLAVIX) 75 MG tablet Take 1 tablet (75 mg total) by mouth daily with breakfast. 10/04/20   Minna Merritts, MD  Coenzyme Q10 300 MG CAPS Take 300 mg by mouth daily.     [provider]  DULoxetine (CYMBALTA) 30 MG capsule Take 30 mg by mouth every morning. Takes with 60 mg for a total  of '90mg'$  05/04/17   [provider]  DULoxetine (CYMBALTA) 60 MG capsule Take 60 mg by mouth daily. Takes with '30mg'$  for a total of '90mg'$     [provider]  enalapril (VASOTEC) 5 MG tablet Take 1 tablet (5 mg total) by mouth daily. 06/02/20   Loel Dubonnet, NP  fexofenadine (ALLEGRA) 180 MG tablet Take 180 mg by mouth daily. 09/14/20   [provider]  fluticasone (FLONASE) 50 MCG/ACT nasal spray Place 2 sprays into the nose daily. 05/23/19 11/08/20  [provider]  furosemide (LASIX) 20 MG tablet Take 1 tablet (20 mg total) by mouth daily as needed for edema. 10/04/20   Minna Merritts, MD  guaiFENesin-dextromethorphan (ROBITUSSIN DM) 100-10 MG/5ML syrup Take 5 mLs by mouth  every 4 (four) hours as needed for cough. 06/20/20   Sidney Ace, MD  letrozole (FEMARA) 2.5 MG tablet TAKE 1 TABLET BY MOUTH EVERY DAY 03/22/20   Verlon Au, NP  lidocaine-prilocaine (EMLA) cream Apply 1 application as needed topically (for port access).    [provider]  magnesium oxide (MAG-OX) 400 MG tablet Take 400 mg by mouth daily.    [provider]  Melatonin 5 MG TABS Take 10 mg by mouth at bedtime as needed (sleep).    [provider]  montelukast (SINGULAIR) 10 MG tablet Take 10 mg by mouth at bedtime.  05/23/16   [provider]  nitroGLYCERIN (NITROSTAT) 0.4 MG SL tablet Place 1 tablet (0.4 mg total) under the tongue every 5 (five) minutes as needed for chest pain. Patient not taking: Reported on 11/08/2020 09/03/19 10/04/20  Loel Dubonnet, NP  pantoprazole (PROTONIX) 40 MG tablet TAKE 1 TABLET BY MOUTH EVERY DAY 09/14/20   Minna Merritts, MD  rosuvastatin (CRESTOR) 20 MG tablet TAKE 1 TABLET BY MOUTH EVERY DAY AT 6PM 09/14/20   Minna Merritts, MD  spironolactone (ALDACTONE) 25 MG tablet Take 0.5 tablets (12.5 mg total) by mouth daily. 10/04/20   Minna Merritts, MD  tiotropium (SPIRIVA) 18 MCG inhalation capsule Place into inhaler and inhale. 08/18/20 08/18/21  [provider]  prochlorperazine (COMPAZINE) 10 MG tablet Take 1 tablet (10 mg total) by mouth every 6 (six) hours as needed (Nausea or vomiting). 10/20/16 07/31/17  Lloyd Huger, MD    Allergies Nsaids, Penicillins, Tamsulosin, Atorvastatin, Gabapentin, Ezetimibe, and Aspirin  Family History  Problem Relation Age of Onset   Colon cancer Mother    Breast cancer Neg Hx     Social History Social History   Tobacco Use   Smoking status: Former    Packs/day: 0.50    Types: Cigarettes    Quit date: 07/23/1988    Years since quitting: 32.3   Smokeless tobacco: Never  Vaping Use   Vaping Use: Never used  Substance Use Topics   Alcohol use: Yes     Comment: rare   Drug use: No    Review of Systems  Review of Systems  Constitutional:  Positive for fatigue. Negative for chills and fever.  HENT:  Negative for sore throat.   Respiratory:  Positive for cough, shortness of breath and wheezing.   Cardiovascular:  Negative for chest pain.  Gastrointestinal:  Negative for abdominal pain.  Genitourinary:  Negative for flank pain.  Musculoskeletal:  Negative for neck pain.  Skin:  Negative for rash and wound.  Allergic/Immunologic: Negative for immunocompromised state.  Neurological:  Positive for weakness. Negative for numbness.  Hematological:  Does  not bruise/bleed easily.    ____________________________________________  PHYSICAL EXAM:      VITAL SIGNS: ED Triage Vitals  Enc Vitals Group     BP 11/13/20 1251 108/61     Pulse Rate 11/13/20 1246 94     Resp 11/13/20 1251 (!) 26     Temp 11/13/20 1246 99.3 F (37.4 C)     Temp src --      SpO2 11/13/20 1246 (!) 89 %     Weight 11/13/20 1249 138 lb 14.2 oz (63 kg)     Height 11/13/20 1249 '5\' 2"'$  (1.575 m)     Head Circumference --      Peak Flow --      Pain Score 11/13/20 1248 0     Pain Loc --      Pain Edu? --      Excl. in [place]? --      Physical Exam Vitals and nursing note reviewed.  Constitutional:      General: She is not in acute distress.    Appearance: She is well-developed.  HENT:     Head: Normocephalic and atraumatic.  Eyes:     Conjunctiva/sclera: Conjunctivae normal.  Cardiovascular:     Rate and Rhythm: Normal rate and regular rhythm.     Heart sounds: Normal heart sounds. No murmur heard.   No friction rub.  Pulmonary:     Effort: Pulmonary effort is normal. No respiratory distress.     Breath sounds: Examination of the right-upper field reveals wheezing. Examination of the left-upper field reveals wheezing. Examination of the right-middle field reveals wheezing. Examination of the left-middle field reveals wheezing. Examination of the right-lower  field reveals wheezing. Examination of the left-lower field reveals wheezing. Wheezing present. No rales.  Abdominal:     General: There is no distension.     Palpations: Abdomen is soft.     Tenderness: There is no abdominal tenderness.  Musculoskeletal:     Cervical back: Neck supple.  Skin:    General: Skin is warm.     Capillary Refill: Capillary refill takes less than 2 seconds.  Neurological:     Mental Status: She is alert and oriented to person, place, and time.     Motor: No abnormal muscle tone.      ____________________________________________   LABS (all labs ordered are listed, but only abnormal results are displayed)  Labs Reviewed  COMPREHENSIVE METABOLIC PANEL - Abnormal; Notable for the following components:      Result Value   Sodium 134 (*)    CO2 20 (*)    Glucose, Bld 115 (*)    BUN 61 (*)    Creatinine, Ser 2.18 (*)    Calcium 8.3 (*)    Albumin 2.7 (*)    AST 57 (*)    GFR, Estimated 22 (*)    All other components within normal limits  CBC WITH DIFFERENTIAL/PLATELET - Abnormal; Notable for the following components:   WBC 17.5 (*)    RDW 17.7 (*)    Neutro Abs 12.5 (*)    Monocytes Absolute 2.4 (*)    All other components within normal limits  RESP PANEL BY RT-PCR (FLU A&B, COVID) ARPGX2  CULTURE, BLOOD (ROUTINE X 2)  CULTURE, BLOOD (ROUTINE X 2)  EXPECTORATED SPUTUM ASSESSMENT W GRAM STAIN, RFLX TO RESP C  RESPIRATORY PANEL BY PCR  LACTIC ACID, PLASMA  PROTIME-INR  URINALYSIS, COMPLETE (UACMP) WITH MICROSCOPIC  BRAIN NATRIURETIC PEPTIDE  PROCALCITONIN  HIV ANTIBODY (ROUTINE  TESTING W REFLEX)  BASIC METABOLIC PANEL  CBC WITH DIFFERENTIAL/PLATELET  TROPONIN I (HIGH SENSITIVITY)    ____________________________________________  EKG: Normal sinus rhythm, ventricular rate 90.  PR 139, QRS 69, QTc 415.  No acute ST elevations or depressions when acute events of acute ischemia or  infarct. ________________________________________  RADIOLOGY All imaging, including plain films, CT scans, and ultrasounds, independently reviewed by me, and interpretations confirmed via formal radiology reads.  ED MD interpretation:   Severe emphysema with slightly increased interstitial densities, likely pneumonia.  Official radiology report(s): DG Chest Port 1 View  Result Date: 11/13/2020 CLINICAL DATA:  Sepsis. EXAM: PORTABLE CHEST 1 VIEW COMPARISON:  08/18/2020 and chest CT 06/17/2020. FINDINGS: Again noted are severe emphysematous changes in the upper lungs. Slightly increased interstitial lung densities in lower chest bilaterally. New focal densities in the right mid lung region. Stable position of left subclavian Port-A-Cath with tip in the SVC region. Heart size is within normal limits and stable. Atherosclerotic calcifications at the aortic arch. Surgical clips in the right axilla. IMPRESSION: 1. Severe emphysema with slightly increased interstitial densities in the lower lungs and concern for more focal disease in the right mid lung region. Differential diagnosis includes atypical infection versus mild interstitial edema. 2. Stable position of the left subclavian Port-A-Cath. Electronically Signed   By: Markus Daft M.D.   On: 11/13/2020 13:34    ____________________________________________  PROCEDURES   Procedure(s) performed (including Critical Care):  .Critical Care  Date/Time: 11/13/2020 6:12 PM Performed by: Duffy Bruce, MD Authorized by: Duffy Bruce, MD   Critical care provider statement:    Critical care time (minutes):  35   Critical care time was exclusive of:  Separately billable procedures and treating other patients and teaching time   Critical care was necessary to treat or prevent imminent or life-threatening deterioration of the following conditions:  Circulatory failure, cardiac failure and respiratory failure   Critical care was time spent personally  by me on the following activities:  Development of treatment plan with patient or surrogate, discussions with consultants, evaluation of patient's response to treatment, examination of patient, obtaining history from patient or surrogate, ordering and performing treatments and interventions, ordering and review of laboratory studies, ordering and review of radiographic studies, pulse oximetry, re-evaluation of patient's condition and review of old charts   I assumed direction of critical care for this patient from another provider in my specialty: no    ____________________________________________  INITIAL IMPRESSION / MDM / [place] / ED COURSE  As part of my medical decision making, I reviewed the following data within the [place] notes reviewed and incorporated, Old chart reviewed, Notes from prior ED visits, and [place] Controlled Substance Database       *Krista Cisneros was evaluated in Emergency Department on 11/13/2020 for the symptoms described in the history of present illness. She was evaluated in the context of the global COVID-19 pandemic, which necessitated consideration that the patient might be at risk for infection with the SARS-CoV-2 virus that causes COVID-19. Institutional protocols and algorithms that pertain to the evaluation of patients at risk for COVID-19 are in a state of rapid change based on information released by regulatory bodies including the CDC and federal and state organizations. These policies and algorithms were followed during the patient's care in the ED.  Some ED evaluations and interventions may be delayed as a result of limited staffing during the pandemic.*     Medical Decision Making:  80 yo F here with cough, SOB, sputum production. Pt diffusely wheezing with increased WOB on exam. Received solumedrol en route. Suspect COPD exacerbation with superimposed PNA. Pt started on empiric ABX, duonebs, and will plan to admit.  Lab work shows significant leukocytosis, with tachypnea + hypoxia c/w sepsis 2/2 PNA. CXR reviewed, shows severe emphysema with superimposed infection. LA normal, no signs of severe sepsis. Will admit to medicine. Improved with nebs, 2L [place] (new O2 requirement for pt). COVID pending.  ____________________________________________  FINAL CLINICAL IMPRESSION(S) / ED DIAGNOSES  Final diagnoses:  COPD exacerbation ([place])  Community acquired pneumonia, unspecified laterality  Acute respiratory failure with hypoxia ([place])     MEDICATIONS GIVEN DURING THIS VISIT:  Medications  methylPREDNISolone sodium succinate (SOLU-MEDROL) 125 mg/2 mL injection 80 mg (80 mg Intravenous Not Given 11/13/20 1534)    Followed by  predniSONE (DELTASONE) tablet 40 mg (has no administration in time range)  ipratropium-albuterol (DUONEB) 0.5-2.5 (3) MG/3ML nebulizer solution 3 mL (3 mLs Nebulization Given 11/13/20 1636)  umeclidinium-vilanterol (ANORO ELLIPTA) 62.5-25 MCG/INH 1 puff (1 puff Inhalation Not Given 11/13/20 1636)  levofloxacin (LEVAQUIN) IVPB 500 mg (has no administration in time range)  guaiFENesin (MUCINEX) 12 hr tablet 600 mg (has no administration in time range)  ipratropium-albuterol (DUONEB) 0.5-2.5 (3) MG/3ML nebulizer solution 3 mL (3 mLs Nebulization Given 11/13/20 1304)  albuterol (PROVENTIL) (2.5 MG/3ML) 0.083% nebulizer solution 2.5 mg (2.5 mg Nebulization Given 11/13/20 1304)  albuterol (PROVENTIL) (2.5 MG/3ML) 0.083% nebulizer solution 2.5 mg (2.5 mg Nebulization Given 11/13/20 1304)  sodium chloride 0.9 % bolus 1,000 mL (1,000 mLs Intravenous New Bag/Given 11/13/20 1420)  levofloxacin (LEVAQUIN) IVPB 750 mg (0 mg Intravenous Stopped 11/13/20 1636)     ED Discharge Orders     None        Note:  This document was prepared using Dragon voice recognition software and may include unintentional dictation errors.   Duffy Bruce, MD 11/13/20 [phone]

## 2020-11-13 NOTE — Progress Notes (Signed)
PHARMACY NOTE:  ANTIMICROBIAL RENAL DOSAGE ADJUSTMENT  Current antimicrobial regimen includes a mismatch between antimicrobial dosage and estimated renal function.  As per policy approved by the Pharmacy & Therapeutics and Medical Executive Committees, the antimicrobial dosage will be adjusted accordingly.  Current antimicrobial dosage:  Levaquin '750mg'$  IV q24h  Indication: COPD Excerbation  Renal Function:  Estimated Creatinine Clearance: 18.3 mL/min (A) (by C-G formula based on SCr of 2.18 mg/dL (H)). '[]'$      On intermittent HD, scheduled: '[]'$      On CRRT    Antimicrobial dosage has been changed to:  Levaquin '500mg'$  IV q48h  Additional comments:   Thank you for allowing pharmacy to be a part of this patient's care.  Paulina Fusi, PharmD, BCPS 11/13/2020 3:53 PM

## 2020-11-14 ENCOUNTER — Other Ambulatory Visit: Payer: Self-pay

## 2020-11-14 ENCOUNTER — Encounter: Payer: Self-pay | Admitting: Family Medicine

## 2020-11-14 DIAGNOSIS — J9601 Acute respiratory failure with hypoxia: Secondary | ICD-10-CM | POA: Diagnosis not present

## 2020-11-14 LAB — URINALYSIS, COMPLETE (UACMP) WITH MICROSCOPIC
Bilirubin Urine: NEGATIVE
Glucose, UA: NEGATIVE mg/dL
Ketones, ur: NEGATIVE mg/dL
Nitrite: POSITIVE — AB
Protein, ur: 30 mg/dL — AB
Specific Gravity, Urine: 1.016 (ref 1.005–1.030)
pH: 6 (ref 5.0–8.0)

## 2020-11-14 LAB — BASIC METABOLIC PANEL
Anion gap: 13 (ref 5–15)
BUN: 56 mg/dL — ABNORMAL HIGH (ref 8–23)
CO2: 21 mmol/L — ABNORMAL LOW (ref 22–32)
Calcium: 8.6 mg/dL — ABNORMAL LOW (ref 8.9–10.3)
Chloride: 101 mmol/L (ref 98–111)
Creatinine, Ser: 1.85 mg/dL — ABNORMAL HIGH (ref 0.44–1.00)
GFR, Estimated: 27 mL/min — ABNORMAL LOW (ref >60)
Glucose, Bld: 143 mg/dL — ABNORMAL HIGH (ref 70–99)
Potassium: 4.1 mmol/L (ref 3.5–5.1)
Sodium: 135 mmol/L (ref 135–145)

## 2020-11-14 LAB — CBC WITH DIFFERENTIAL/PLATELET
Abs Immature Granulocytes: 0.09 10*3/uL — ABNORMAL HIGH (ref 0.00–0.07)
Basophils Absolute: 0 10*3/uL (ref 0.0–0.1)
Basophils Relative: 0 %
Eosinophils Absolute: 0 10*3/uL (ref 0.0–0.5)
Eosinophils Relative: 0 %
HCT: 37.9 % (ref 36.0–46.0)
Hemoglobin: 11.7 g/dL — ABNORMAL LOW (ref 12.0–15.0)
Immature Granulocytes: 1 %
Lymphocytes Relative: 9 %
Lymphs Abs: 1 10*3/uL (ref 0.7–4.0)
MCH: 26.4 pg (ref 26.0–34.0)
MCHC: 30.9 g/dL (ref 30.0–36.0)
MCV: 85.4 fL (ref 80.0–100.0)
Monocytes Absolute: 0.3 10*3/uL (ref 0.1–1.0)
Monocytes Relative: 3 %
Neutro Abs: 9.9 10*3/uL — ABNORMAL HIGH (ref 1.7–7.7)
Neutrophils Relative %: 87 %
Platelets: 272 10*3/uL (ref 150–400)
RBC: 4.44 MIL/uL (ref 3.87–5.11)
RDW: 17.7 % — ABNORMAL HIGH (ref 11.5–15.5)
WBC: 11.2 10*3/uL — ABNORMAL HIGH (ref 4.0–10.5)
nRBC: 0 % (ref 0.0–0.2)

## 2020-11-14 LAB — HIV ANTIBODY (ROUTINE TESTING W REFLEX): HIV Screen 4th Generation wRfx: NONREACTIVE

## 2020-11-14 MED ORDER — SODIUM CHLORIDE 0.9 % IV SOLN: INTRAVENOUS | Status: AC

## 2020-11-14 MED ORDER — ACETAMINOPHEN 325 MG PO TABS
650.0000 mg | ORAL_TABLET | Freq: Four times a day (QID) | ORAL | Status: DC | PRN
  Administered 2020-11-14 – 2020-11-16 (×2): 650 mg via ORAL
  Filled 2020-11-14 (×2): qty 2

## 2020-11-14 NOTE — ED Notes (Signed)
Changed pt gown and adjusted pt position in bed.

## 2020-11-14 NOTE — ED Notes (Signed)
Lab called for morning venipuncture assist.

## 2020-11-14 NOTE — ED Notes (Signed)
Report from Dennis, rn.

## 2020-11-14 NOTE — ED Notes (Signed)
Pt oxygen turned off at this time. Will continue to monitor. Pt coughing but not coughing anything up.

## 2020-11-14 NOTE — ED Notes (Signed)
Pt provided breakfast tray.

## 2020-11-14 NOTE — ED Notes (Signed)
PT at bedside with patient

## 2020-11-14 NOTE — Evaluation (Signed)
Occupational Therapy Evaluation Patient Details Name: Krista Cisneros MRN: [id number] DOB: 1940/10/05 Today's Date: 11/14/2020    History of Present Illness Krista Cisneros is a 80 y.o. female with a past medical history of COPD/severe emphysema not on home oxygen supplementation, coronary artery disease status post PCI, CHF with reduced ejection fraction, hypertension, hyperlipidemia, depression/anxiety, history of recurrent pneumonia, former tobacco user, history of right breast cancer, GERD.  The patient presented to the emergency department 11/13/20 due to generalized weakness, following fall. Patient admitted for acute hypoxic respiratory failure secondary to COPD/severe emphysema exacerbation and community-aquired pneumonia with sepsis.   Clinical Impression   Pt seen for OT evaluation this date. Upon arrival to room, pt awake in bed with granddaughter present. Pt A&Ox3, however per granddaughter, pt with cognition different from baseline and currently hallucinating. PLOF confirmed by granddaughter. Prior to admission, pt was typically independent in ADLs and functional mobility, living in a 2-story home with daughter and son-in-law. Per granddaughter, pt required increased assistance last week with ADLs and was using a RW during functional mobility, with granddaughter attributing increased assistance to stress of pt's daughter hospitalized last week.   Pt currently requires MOD A for bed mobility, MIN A to maintain static sitting at EOB (with feet unsupported d/t bed height), and MIN A to perform bed-level LB dressing due to current functional impairments (See OT Problem List below). Pt unable to participate in seated ADLs or OOB mobility this date. Pt would benefit from additional skilled OT services to maximize return to PLOF and minimize risk of future falls, injury, caregiver burden, and readmission. Upon discharge, recommend  SNF.       Follow Up Recommendations  SNF    Equipment  Recommendations  None recommended by OT       Precautions / Restrictions Precautions Precautions: Fall Precaution Comments: left port access Restrictions Weight Bearing Restrictions: No      Mobility Bed Mobility Overal bed mobility: Needs Assistance Bed Mobility: Supine to Sit;Sit to Supine;Rolling Rolling: Supervision   Supine to sit: Mod assist Sit to supine: Mod assist        Transfers                 General transfer comment: unable to attempt this date    Balance Overall balance assessment: Needs assistance Sitting-balance support: Bilateral upper extremity supported;Feet unsupported Sitting balance-Leahy Scale: Poor Sitting balance - Comments: With feet unsupported (d/t height of bed in ED), pt required MIN A and bilateral UE support to maintain static sitting balance. Unable to engage in ADLs while seated EOB d/t poor static sitting balance     Standing balance-Leahy Scale: Zero Standing balance comment: unable to attempt stand at this time                           ADL either performed or assessed with clinical judgement   ADL Overall ADL's : Needs assistance/impaired                     Lower Body Dressing: Minimal assistance;Bed level Lower Body Dressing Details (indicate cue type and reason): MIN A to don/doff underwear                      Pertinent Vitals/Pain Pain Assessment: No/denies pain        Extremity/Trunk Assessment Upper Extremity Assessment Upper Extremity Assessment: Generalized weakness   Lower Extremity Assessment Lower Extremity  Assessment: Generalized weakness       Communication Communication Communication: No difficulties   Cognition Arousal/Alertness: Awake/alert Behavior During Therapy: Anxious Overall Cognitive Status: Impaired/Different from baseline Area of Impairment: Orientation;Attention;Memory;Following commands;Safety/judgement;Awareness;Problem solving                  Orientation Level: Disoriented to;Situation Current Attention Level: Focused Memory: Decreased recall of precautions;Decreased short-term memory Following Commands: Follows one step commands inconsistently Safety/Judgement: Decreased awareness of deficits   Problem Solving: Slow processing;Requires verbal cues;Requires tactile cues;Difficulty sequencing General Comments: Pt alert and oriented to self, place, and time; disoriented to situation. Pt pleasant and agreeable, however anxious with OOB mobility and demonstrating increased difficultly sequencing bed mobility, requiring multi-modal cues. Pt hallucinating during session; RN informed              Home Living Family/patient expects to be discharged to:: Private residence Living Arrangements: Children (lives with daughter and son in law (both were recently hospitalized).) Available Help at Discharge: Family;Available 24 hours/day;Available PRN/intermittently (Granddaughter available PRN) Type of Home: House Home Access: Stairs to enter [employer] of Steps: 3 Entrance Stairs-Rails: Right Home Layout: Two level;Bed/bath upstairs Alternate Level Stairs-Number of Steps: 14 Alternate Level Stairs-Rails: Left Bathroom Shower/Tub: Teacher, early years/pre: Standard     Home Equipment: Environmental consultant - 2 wheels;Cane - single point;Bedside commode;Tub bench;Hand held shower head;Grab bars - toilet;Grab bars - tub/shower   Additional Comments: Patient lives with daughter and son in law. Her bed/bath are on 2nd floor.      Prior Functioning/Environment Level of Independence: Needs assistance  Gait / Transfers Assistance Needed: Pt typically ambulates without AD, although occassionally uses RW/SPC when fatigued ADL's / Homemaking Assistance Needed: Pt typically independent with ADLs, occassionally requiring MIN assistance when fatigued   Comments: Patient at first reports only one fall in the last 6 months but later  reports multiple falls including slipping on new floor surface        OT Problem List: Decreased strength;Decreased activity tolerance;Impaired balance (sitting and/or standing);Decreased coordination;Decreased cognition;Decreased safety awareness      OT Treatment/Interventions: Self-care/ADL training;Therapeutic exercise;Energy conservation;DME and/or AE instruction;Therapeutic activities;Visual/perceptual remediation/compensation;Patient/family education;Balance training    OT Goals(Current goals can be found in the care plan section) Acute Rehab OT Goals Patient Stated Goal: to go home OT Goal Formulation: With patient Time For Goal Achievement: 11/28/20 Potential to Achieve Goals: Fair ADL Goals Pt Will Perform Grooming: with set-up;with supervision;sitting Pt Will Perform Lower Body Dressing: with min assist;sit to/from stand Pt Will Transfer to Toilet: with min guard assist;stand pivot transfer;bedside commode  OT Frequency: Min 1X/week    AM-PAC OT "6 Clicks" Daily Activity     Outcome Measure Help from another person eating meals?: None Help from another person taking care of personal grooming?: A Little Help from another person toileting, which includes using toliet, bedpan, or urinal?: A Lot Help from another person bathing (including washing, rinsing, drying)?: A Lot Help from another person to put on and taking off regular upper body clothing?: A Little Help from another person to put on and taking off regular lower body clothing?: A Little 6 Click Score: 17   End of Session Equipment Utilized During Treatment: Oxygen Nurse Communication: Mobility status  Activity Tolerance: Patient tolerated treatment well Patient left: in bed;with call bell/phone within reach;with nursing/sitter in room;with family/visitor present  OT Visit Diagnosis: Muscle weakness (generalized) (M62.81);History of falling (Z91.81)  Time: [id number] OT Time Calculation (min): 32  min Charges:  OT General Charges $OT Visit: 1 Visit OT Evaluation $OT Eval Moderate Complexity: 1 Mod OT Treatments $Self Care/Home Management : 8-22 mins $Therapeutic Activity: 8-22 mins  Fredirick Maudlin, OTR/L [place]

## 2020-11-14 NOTE — ED Notes (Signed)
Report to kate, rn.  

## 2020-11-14 NOTE — Progress Notes (Signed)
Due to his PROGRESS NOTE    Esthefani Bastien  [id number] DOB: [birth year] DOA: 11/13/2020 PCP: Sharyne Peach, MD   Brief Narrative:  HPI: Krista Cisneros is a 80 y.o. female with a past medical history of COPD/severe emphysema not on home oxygen supplementation, coronary artery disease status post PCI, CHF with reduced ejection fraction, hypertension, hyperlipidemia, depression/anxiety, history of recurrent pneumonia, former tobacco user, history of right breast cancer, GERD.  The patient presented to the emergency department due to generalized weakness, some chills.  She has been feeling short of breath for the past few days.  Also having worsening dry cough.  She was satting 86% when EMS arrived.  89% on room air in the emergency department.  Now on 2 L and saturating in the mid 90s.  Granddaughter is present at bedside.  No fevers.  She had a fall secondary to generalized weakness today and fell on her side and hit the bridge of her nose as well.  She is having shortness of breath at rest.  Worsened with exertion.  No alleviating factors.  She has received the COVID-19 vaccine and booster.  Last pneumonia was approximately 2 months ago.  En-route via EMS she received 125 mg of Solu-Medrol, 2 nebulizer treatments with duo nebs.  Upon my evaluation she is not in any respiratory distress.  She was getting home health physical therapy and was doing well with it but recently after she was progressing well the granddaughter states that it was discontinued.       ED Course: Lab work obtained.  Chest x-ray obtained.  Albuterol 2.5 mg neb treatment x2, DuoNeb 3 mL neb treatment x1, normal saline 1 L bolus.  Levaquin 750 mg IV x1.  Blood cultures also obtained.  Assessment & Plan:   Active Problems:   COPD with acute exacerbation ([place])  Sepsis and acute hypoxic respiratory failure secondary to COPD exacerbation and community-acquired pneumonia: Patient meets sepsis criteria based off of  leukocytosis and tachypnea.  Patient states that she feels better than yesterday.  However, she is still dyspneic when she is talking.  On exam, she has diffuse expiratory wheezes.  Some rhonchi at the left base.  We will try to wean her oxygen down to room air.  Continue steroids, bronchodilators and Levaquin.     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  Baseline appears to be around 1.2-1.6.  Creatinine has improved to 1.9 today.  Likely prerenal.  She is still looks dry clinically.  Stray of diastolic congestive heart failure, I will be careful and start her on low rate of IV fluids, normal saline 800 cc/h for 10 hours.  Reassess tomorrow morning.  Repeat labs in the morning.   History of breast cancer: Continue home Femara 2.5 mg daily   CHF with reduced ejection fraction: Not in acute decompensation.  Continue home Coreg 3.125 mg twice daily.  Holding home Aldactone 12.5 mg daily and Lasix 20 mg daily as needed due to acute kidney injury.   Essential hypertension: Controlled.  Continue Coreg but holding home Vasotec 5 mg daily due to the acute kidney injury.   Hyperlipidemia: Continue home Crestor 20 mg daily   Coronary artery disease status post PCI: Asymptomatic.  Continue home Plavix 75 mg daily   Depression/anxiety: Continue home duloxetine 90 mg daily   GERD: Continue home Protonix 40 mg daily  DVT prophylaxis: heparin injection 5,000 Units Start: 11/13/20 2200   Code  Status: Prior  Family Communication:  None present at bedside.  Plan of care discussed with patient in length and he verbalized understanding and agreed with it.  Status is: Inpatient  Remains inpatient appropriate because:Inpatient level of care appropriate due to severity of illness  Dispo: The patient is from: Home              Anticipated d/c is to: Home              Patient currently is not medically stable to d/c.   Difficult to place patient  No        Estimated body mass index is 25.4 kg/m as calculated from the following:   Height as of this encounter: '5\' 2"'$  (1.575 m).   Weight as of this encounter: 63 kg.      Nutritional status:               Consultants:  None  Procedures:  None  Antimicrobials:  Anti-infectives (From admission, onward)    Start     Dose/Rate Route Frequency Ordered Stop   11/15/20 1000  levofloxacin (LEVAQUIN) IVPB 500 mg        500 mg 100 mL/hr over 60 Minutes Intravenous Every 48 hours 11/13/20 1551 11/19/20 0959   11/14/20 1000  levofloxacin (LEVAQUIN) IVPB 750 mg  Status:  Discontinued        750 mg 100 mL/hr over 90 Minutes Intravenous Every 24 hours 11/13/20 1508 11/13/20 1551   11/13/20 1400  levofloxacin (LEVAQUIN) IVPB 750 mg        750 mg 100 mL/hr over 90 Minutes Intravenous  Once 11/13/20 1350 11/13/20 1636          Subjective: Seen and examined.  She says that she is feeling better than yesterday.  However, still looks dyspneic while talking to me.  Objective: Vitals:   11/14/20 0830 11/14/20 0854 11/14/20 0857 11/14/20 0900  BP: 137/65   137/70  Pulse: 88 85 83 83  Resp: (!) '24 20 20 '$ (!) 25  Temp:      SpO2: 95% (!) 88% 90% 92%  Weight:      Height:        Intake/Output Summary (Last 24 hours) at 11/14/2020 0958 Last data filed at 11/14/2020 0630 Gross per 24 hour  Intake --  Output 600 ml  Net -600 ml   Filed Weights   11/13/20 1249  Weight: 63 kg    Examination:  General exam: Appears slightly dyspneic. Respiratory system: Diffuse expiratory wheezes bilaterally with rhonchi at the left base.  Respiratory effort normal. Cardiovascular system: S1 & S2 heard, RRR. No JVD, murmurs, rubs, gallops or clicks. No pedal edema. Gastrointestinal system: Abdomen is nondistended, soft and nontender. No organomegaly or masses felt. Normal bowel sounds heard. Central nervous system: Alert and oriented. No focal neurological deficits. Extremities:  Symmetric 5 x 5 power. Skin: No rashes, lesions or ulcers Psychiatry: Judgement and insight appear normal. Mood & affect appropriate.    Data Reviewed: I have personally reviewed following labs and imaging studies  CBC: Recent Labs  Lab 11/08/20 1320 11/13/20 1301 11/14/20 0708  WBC 12.2* 17.5* 11.2*  NEUTROABS  --  12.5* 9.9*  HGB 12.8 12.5 11.7*  HCT 42.3 40.8 37.9  MCV 86.3 85.0 85.4  PLT 340 263 [id number]   Basic Metabolic Panel: Recent Labs  Lab 11/08/20 1320 11/13/20 1301 11/14/20 0708  NA 134* 134* 135  K 4.3 4.1 4.1  CL 104 101 101  CO2 21* 20* 21*  GLUCOSE 151* 115* 143*  BUN 45* 61* 56*  CREATININE 1.66* 2.18* 1.85*  CALCIUM 9.1 8.3* 8.6*   GFR: Estimated Creatinine Clearance: 21.5 mL/min (A) (by C-G formula based on SCr of 1.85 mg/dL (H)). Liver Function Tests: Recent Labs  Lab 11/13/20 1301  AST 57*  ALT 27  ALKPHOS 51  BILITOT 0.9  PROT 7.0  ALBUMIN 2.7*   No results for input(s): LIPASE, AMYLASE in the last 168 hours. No results for input(s): AMMONIA in the last 168 hours. Coagulation Profile: Recent Labs  Lab 11/13/20 1301  INR 1.1   Cardiac Enzymes: No results for input(s): CKTOTAL, CKMB, CKMBINDEX, TROPONINI in the last 168 hours. BNP (last 3 results) No results for input(s): PROBNP in the last 8760 hours. HbA1C: No results for input(s): HGBA1C in the last 72 hours. CBG: No results for input(s): GLUCAP in the last 168 hours. Lipid Profile: No results for input(s): CHOL, HDL, LDLCALC, TRIG, CHOLHDL, LDLDIRECT in the last 72 hours. Thyroid Function Tests: No results for input(s): TSH, T4TOTAL, FREET4, T3FREE, THYROIDAB in the last 72 hours. Anemia Panel: No results for input(s): VITAMINB12, FOLATE, FERRITIN, TIBC, IRON, RETICCTPCT in the last 72 hours. Sepsis Labs: Recent Labs  Lab 11/13/20 1301 11/13/20 1700  PROCALCITON  --  1.04  LATICACIDVEN 1.2  --     Recent Results (from the past 240 hour(s))  Culture, blood (Routine x 2)      Status: None (Preliminary result)   Collection Time: 11/13/20  1:01 PM   Specimen: BLOOD LEFT HAND  Result Value Ref Range Status   Specimen Description BLOOD LEFT HAND  Final   Special Requests   Final    BOTTLES DRAWN AEROBIC AND ANAEROBIC Blood Culture results may not be optimal due to an inadequate volume of blood received in culture bottles   Culture   Final    NO GROWTH < 24 HOURS Performed at [facility], [address], [place], [place] [address]    Report Status PENDING  Incomplete  Resp Panel by RT-PCR (Flu A&B, Covid) Nasopharyngeal Swab     Status: None   Collection Time: 11/13/20  2:20 PM   Specimen: Nasopharyngeal Swab; Nasopharyngeal(NP) swabs in vial transport medium  Result Value Ref Range Status   SARS Coronavirus 2 by RT PCR NEGATIVE NEGATIVE Final    Comment: (NOTE) SARS-CoV-2 target nucleic acids are NOT DETECTED.  The SARS-CoV-2 RNA is generally detectable in upper respiratory specimens during the acute phase of infection. The lowest concentration of SARS-CoV-2 viral copies this assay can detect is 138 copies/mL. A negative result does not preclude SARS-Cov-2 infection and should not be used as the sole basis for treatment or other patient management decisions. A negative result may occur with  improper specimen collection/handling, submission of specimen other than nasopharyngeal swab, presence of viral mutation(s) within the areas targeted by this assay, and inadequate number of viral copies(<138 copies/mL). A negative result must be combined with clinical observations, patient history, and epidemiological information. The expected result is Negative.  Fact Sheet for Patients:  EntrepreneurPulse.com.au  Fact Sheet for Healthcare Providers:  IncredibleEmployment.be  This test is no t yet approved or cleared by the [place] FDA and  has been authorized for detection and/or diagnosis of SARS-CoV-2  by FDA under an Emergency Use Authorization (EUA). This EUA will remain  in effect (meaning this test can be used) for the duration of the COVID-19 declaration under Section 564(b)(1) of the Act,  21 U.S.C.section 360bbb-3(b)(1), unless the authorization is terminated  or revoked sooner.       Influenza A by PCR NEGATIVE NEGATIVE Final   Influenza B by PCR NEGATIVE NEGATIVE Final    Comment: (NOTE) The Xpert Xpress SARS-CoV-2/FLU/RSV plus assay is intended as an aid in the diagnosis of influenza from Nasopharyngeal swab specimens and should not be used as a sole basis for treatment. Nasal washings and aspirates are unacceptable for Xpert Xpress SARS-CoV-2/FLU/RSV testing.  Fact Sheet for Patients: EntrepreneurPulse.com.au  Fact Sheet for Healthcare Providers: IncredibleEmployment.be  This test is not yet approved or cleared by the [place] FDA and has been authorized for detection and/or diagnosis of SARS-CoV-2 by FDA under an Emergency Use Authorization (EUA). This EUA will remain in effect (meaning this test can be used) for the duration of the COVID-19 declaration under Section 564(b)(1) of the Act, 21 U.S.C. section 360bbb-3(b)(1), unless the authorization is terminated or revoked.  Performed at [facility], [address] [place], [place] [address]       Radiology Studies: CT HEAD WO CONTRAST  Result Date: 11/13/2020 CLINICAL DATA:  Fall, shortness of breath, weakness EXAM: CT HEAD WITHOUT CONTRAST TECHNIQUE: Contiguous axial images were obtained from the base of the skull through the vertex without intravenous contrast. COMPARISON:  None. FINDINGS: Brain: No evidence of acute infarction, hemorrhage, hydrocephalus, extra-axial collection, visible mass lesion or mass effect. Symmetric prominence of the ventricles, cisterns and sulci compatible with parenchymal volume loss. Patchy areas of white matter hypoattenuation are  most compatible with chronic microvascular angiopathy. Vascular: Atherosclerotic calcification of the carotid siphons and intradural vertebral arteries. No hyperdense vessel. Skull: No calvarial fracture or suspicious osseous lesion. No scalp swelling or hematoma. Sinuses/Orbits: Layering air-fluid level seen in the maxillary sinuses. More mild mural thickening in the ethmoids. Mastoid air cells are clear. Middle ear cavities are clear. Included orbital structures are unremarkable. Other: None IMPRESSION: No acute intracranial abnormality. Background of intracranial atherosclerosis, microvascular angiopathy and parenchymal volume loss. Layering air-fluid levels in the maxillary sinuses, may correlate for clinical features of acute sinusitis. Electronically Signed   By: Lovena Le M.D.   On: 11/13/2020 19:57   DG Chest Port 1 View  Result Date: 11/13/2020 CLINICAL DATA:  Sepsis. EXAM: PORTABLE CHEST 1 VIEW COMPARISON:  08/18/2020 and chest CT 06/17/2020. FINDINGS: Again noted are severe emphysematous changes in the upper lungs. Slightly increased interstitial lung densities in lower chest bilaterally. New focal densities in the right mid lung region. Stable position of left subclavian Port-A-Cath with tip in the SVC region. Heart size is within normal limits and stable. Atherosclerotic calcifications at the aortic arch. Surgical clips in the right axilla. IMPRESSION: 1. Severe emphysema with slightly increased interstitial densities in the lower lungs and concern for more focal disease in the right mid lung region. Differential diagnosis includes atypical infection versus mild interstitial edema. 2. Stable position of the left subclavian Port-A-Cath. Electronically Signed   By: Markus Daft M.D.   On: 11/13/2020 13:34    Scheduled Meds:  carvedilol  6.25 mg Oral BID   cholecalciferol  1,000 Units Oral Daily   clopidogrel  75 mg Oral Q breakfast   DULoxetine  90 mg Oral Daily   guaiFENesin  600 mg Oral BID    heparin injection (subcutaneous)  5,000 Units Subcutaneous Q8H   ipratropium-albuterol  3 mL Nebulization Q6H   letrozole  2.5 mg Oral Daily   loratadine  5 mg Oral Daily   magnesium  oxide  200 mg Oral Daily   methylPREDNISolone (SOLU-MEDROL) injection  80 mg Intravenous Q6H   Followed by   Derrill Memo ON 11/15/2020] predniSONE  40 mg Oral Q breakfast   montelukast  10 mg Oral QHS   pantoprazole  40 mg Oral Daily   rosuvastatin  20 mg Oral QPM   umeclidinium-vilanterol  1 puff Inhalation Daily   Continuous Infusions:  [START ON 11/15/2020] levofloxacin (LEVAQUIN) IV       LOS: 1 day   Time spent: 36 minutes   Darliss Cheney, MD [facility]  11/14/2020, 9:58 AM   How to contact the [facility] Attending or Consulting provider [place] or covering provider during after hours [place], for this patient?  Check the care team in [facility] and look for a) attending/consulting [facility] provider listed and b) the [facility] team listed. Page or secure chat [phone]. Log into [email or web address] and use [facility]'s universal password to access. If you do not have the password, please contact the hospital operator. Locate the [facility] provider you are looking for under [facility] and page to a number that you can be directly reached. If you still have difficulty reaching the provider, please page the [facility] (Director on Call) for the Hospitalists listed on amion for assistance.

## 2020-11-14 NOTE — ED Notes (Signed)
Pt states "I thought you were my daughter coming with her truck to pick me up". Pt states "I saw that truck back up in here next door and I thought it was for me". Pt informed there is no truck in the emergency department. Pt states "I know where I am, do you?" pt declines heparin. Pt denies other needs.

## 2020-11-14 NOTE — ED Notes (Signed)
Lab here for venipuncture for am labs.

## 2020-11-14 NOTE — ED Notes (Signed)
Pt was 89% on RA. Placed back on  at 2L. Came up to 93%. Will continue to monitor.

## 2020-11-14 NOTE — Evaluation (Signed)
Physical Therapy Evaluation Patient Details Name: Krista Cisneros MRN: [id number] DOB: [birth year] Today's Date: 11/14/2020   History of Present Illness  Krista Cisneros is a 80 y.o. female with a past medical history of COPD/severe emphysema not on home oxygen supplementation, coronary artery disease status post PCI, CHF with reduced ejection fraction, hypertension, hyperlipidemia, depression/anxiety, history of recurrent pneumonia, former tobacco user, history of right breast cancer, GERD.  The patient presented to the emergency department  11/13/20 due to generalized weakness, some chills. She had a fall secondary to generalized weakness today and fell on her side and hit the bridge of her nose as well.  She was getting home health physical therapy and was doing well with it but recently after she was progressing well the granddaughter states that it was discontinued. Patient admitted for acute hypoxic respiratory failure secondary to COPD/severe emphysema exacerbation and community-aquired pneumonia with sepsis.   Clinical Impression  Patient appeared alert and oriented at start of session and was confident in providing history that is consistent with documentation from last PT eval 4 months ago. She lives with her daughter and SIL in a 2 story home with 3 steps to enter. Her bedroom/bathroom are on the 2nd floor. Reports prior to hospitalization she was ambulating independent with no AD and was I with ADLs and mostly I with IADLs. Reports multiple falls and mentioned slick floor at home contributing. Upon PT eval, patient required min A to perform supine towards sit with some cuing for sequencing at first and hand held support with pt pulling on PT's hand for trunk control. As PT cued/guarded/assisted patient to put both feet on the floor to come to sitting at edge of bed, patient panicked and suddenly extended hips, laying trunk back on bed and pulling legs up trying to get them back in the bed  or extending knees so that feet were not supporting on floor. She refused to sit at edge of bed or put feet on floor. She resisting all further cuing and assistance to come to a more ergonomically sound and safe position or a seated position. Required mod -max assist from PT to move patient to a safer position where she would not slide off bed and the rail could be up up again. PT got assistance from RN to use max A with draw sheet to reposition patient in bed. Patient demo good functional strength in the B B LE and generalized weakness in the B UE, so that PT expected patient could stand and walk with support, assuming SpO2 remained in acceptable range. Patient mostly appeared limited by panic and extreme fear of falling and demonstrated behavior that put patient at risk of falling in response to her fear. Patient's SpO2 dropped during this transfer attempt but returned to baseline with PLB and as she calmed (on 2L/min throughout session). Patient calmed down but did appear to have some changes in mentation following feeling panicked. She mistook PT for her family member, kept asking for water while she was still laying flat on her back at the bottom of the bed, and she kept saying she thought her family members were walking outside of her room. Considering the large of amount of stairs patient needs to be able to complete safely at home, evidence of difficulty with self-regulation and cognition, and current lack of ability to get out of bed safely, PT recommends short term rehab prior to returning home at this time. Patient will be monitored for updates in discharge planning  based on progress as her baseline strength does appear so that she may make rapid progress if her self-regulation and cognition improves. Patient would benefit from skilled physical therapy to address impairments and functional limitations (see PT Problem List below) to work towards stated goals and return to PLOF or maximal functional  independence.      Follow Up Recommendations SNF (subject to progress)    Equipment Recommendations  None recommended by PT    Recommendations for Other Services OT consult;Speech consult     Precautions / Restrictions Precautions Precautions: Fall Precaution Comments: left port access Restrictions Weight Bearing Restrictions: No      Mobility  Bed Mobility Overal bed mobility: Needs Assistance Bed Mobility: Supine to Sit;Sit to Supine     Supine to sit: Min assist Sit to supine: Max assist;+2 for physical assistance;Min assist   General bed mobility comments: Patient followed PT instructions to sit on edge of bed with min A for hand held support until she almost had both feet on the floor. She then panicked and flattened out, extending her hips and laying her trunk back on the bed, lifting her feet off the floor. Would no longer follow PT commands. Was able to get most of her body back in the bed with some min-mod help from PT but unable to scoot up bed or follow directions to be able to get back to comfortable position in the bed. Refused to sit on edge of bed or re-attempt standing and continued to extend hips and trunk. PT assisted patien to awkward lying position where bed rail was able to be raised for patient safety and got help from RN to reposition patient in bed and re-organize disturbed bedclothes. Pateint required max A +2 to reposition and boost up bed. Patient calmed down and was able to be repositioned. Did continue to express fear of falling out of bed and repeatedly asked for water despite laying completely supine at the lower half of the bed. Provided water when pateint was repositioned in middle of bed with head of bed elevated appropriately. SpO2 dropped during transfer attempt and patient was cued in PLB. SpO2 returned to [facility] once repositioned and calmed down.    Transfers Overall transfer level: Needs assistance               General transfer comment:  Patient unable to attempt transfer out of bed due to panick with attempt to sit at edge of bed. Appears strong enough but unable to follow commands when panicking.  Ambulation/Gait             General Gait Details: not appropriate at this time due to patien panic with attemt to sit on edge of bed.  Stairs            Wheelchair Mobility    Modified Rankin (Stroke Patients Only)       Balance Overall balance assessment: Needs assistance Sitting-balance support: Bilateral upper extremity supported Sitting balance-Leahy Scale: Fair Sitting balance - Comments: Patient panicked in sitting and true balance ability is unclear. She forcefully extended hips and layed back due to fear of falling when attempting to sit at edge of bed.     Standing balance-Leahy Scale: Zero Standing balance comment: unable to attempt stand at this time                             Pertinent Vitals/Pain Pain Assessment: Faces Faces Pain Scale: Hurts  a little bit Pain Location: IV access site Pain Intervention(s): Limited activity within patient's tolerance;Monitored during session;Repositioned    Home Living Family/patient expects to be discharged to:: Private residence Living Arrangements: Children (daughter and SIL who stay home most of the time) Available Help at Discharge: Family;Available 24 hours/day Type of Home: House Home Access: Stairs to enter Entrance Stairs-Rails: Right Entrance Stairs-Number of Steps: 3 Home Layout: Two level;Bed/bath upstairs Home Equipment: Walker - 2 wheels;Cane - single point;Bedside commode Additional Comments: Patient lives with daughter and son in law. Her bed/bath are on 2nd floor.    Prior Function Level of Independence: Needs assistance   Gait / Transfers Assistance Needed: Patein reports she ambulates I with no AD  ADL's / Homemaking Assistance Needed: patient reports being I with ADLs and most IADLs including doing some cleaning and  cookking  Comments: Patient at first reports only one fall in the last 6 months but later reports multiple falls including slipping on new floor surface     Hand Dominance        Extremity/Trunk Assessment   Upper Extremity Assessment Upper Extremity Assessment: Generalized weakness    Lower Extremity Assessment Lower Extremity Assessment: Overall WFL for tasks assessed (B knees 5/5 MMT. Able to kick, flex hips, but not push self up effectively in bed.)    Cervical / Trunk Assessment Cervical / Trunk Assessment: Kyphotic (slightly kyphotic)  Communication   Communication: No difficulties  Cognition Arousal/Alertness: Awake/alert Behavior During Therapy: Anxious;Agitated Overall Cognitive Status: No family/caregiver present to determine baseline cognitive functioning                                 General Comments: Patient at first appeared to have normal cognition during conversation and was alert and oriented to self. Became frantic with transfer attempt due to fear of falling and then mistook PT for family member as PT and RN assisted pt back to bed and made her comfortable again. She expressed fear she would fall out of the bed (while positioned properly in ER stretcher with rails up) but accepted reassurance from RN she was safe. Did calm down but did keep asking if family members were nearby (just outside of room, the PT in room, etc).      General Comments      Exercises Other Exercises Other Exercises: practiced bed mobility, attempted transfer, educated patient on role of PT in acute care setting.   Assessment/Plan    PT Assessment Patient needs continued PT services  PT Problem List Decreased cognition;Decreased activity tolerance;Decreased safety awareness;Decreased knowledge of use of DME;Decreased balance;Decreased knowledge of precautions;Decreased mobility;Cardiopulmonary status limiting activity       PT Treatment Interventions DME  instruction;Balance training;Gait training;Neuromuscular re-education;Stair training;Cognitive remediation;Functional mobility training;Therapeutic activities;Therapeutic exercise;Patient/family education    PT Goals (Current goals can be found in the Care Plan section)  Acute Rehab PT Goals Patient Stated Goal: get better PT Goal Formulation: With patient Time For Goal Achievement: 11/28/20 Potential to Achieve Goals: Good    Frequency Min 2X/week   Barriers to discharge Inaccessible home environment 14 steps to 2nd floor where bed/bath is    Co-evaluation               AM-PAC PT "6 Clicks" Mobility  Outcome Measure Help needed turning from your back to your side while in a flat bed without using bedrails?: A Little Help needed moving from lying on your  back to sitting on the side of a flat bed without using bedrails?: A Little Help needed moving to and from a bed to a chair (including a wheelchair)?: Total Help needed standing up from a chair using your arms (e.g., wheelchair or bedside chair)?: Total Help needed to walk in hospital room?: Total Help needed climbing 3-5 steps with a railing? : Total 6 Click Score: 10    End of Session Equipment Utilized During Treatment: Oxygen (2l/min) Activity Tolerance: Other (comment) (Patient limited by fear of falling and panic, SpO2 dropped breifly but unclear if this was from disruption of pleth and/or behavior associated with resisting PT and fear) Patient left: in bed;with call bell/phone within reach Nurse Communication: Mobility status;Other (comment) (request to help patient back to bed) PT Visit Diagnosis: Unsteadiness on feet (R26.81);History of falling (Z91.81);Repeated falls (R29.6);Difficulty in walking, not elsewhere classified (R26.2)    Time: 1047-1130 PT Time Calculation (min) (ACUTE ONLY): 43 min   Charges:   PT Evaluation $PT Eval Moderate Complexity: 1 Mod PT Treatments $Therapeutic Activity: 8-22 mins        Everlean Alstrom. Graylon Good, PT, DPT 11/14/20, 12:20 PM

## 2020-11-15 DIAGNOSIS — J9601 Acute respiratory failure with hypoxia: Secondary | ICD-10-CM | POA: Diagnosis not present

## 2020-11-15 LAB — CBC WITH DIFFERENTIAL/PLATELET
Abs Immature Granulocytes: 0.22 10*3/uL — ABNORMAL HIGH (ref 0.00–0.07)
Basophils Absolute: 0 10*3/uL (ref 0.0–0.1)
Basophils Relative: 0 %
Eosinophils Absolute: 0 10*3/uL (ref 0.0–0.5)
Eosinophils Relative: 0 %
HCT: 38.4 % (ref 36.0–46.0)
Hemoglobin: 11.9 g/dL — ABNORMAL LOW (ref 12.0–15.0)
Immature Granulocytes: 1 %
Lymphocytes Relative: 7 %
Lymphs Abs: 1.1 10*3/uL (ref 0.7–4.0)
MCH: 26 pg (ref 26.0–34.0)
MCHC: 31 g/dL (ref 30.0–36.0)
MCV: 83.8 fL (ref 80.0–100.0)
Monocytes Absolute: 0.9 10*3/uL (ref 0.1–1.0)
Monocytes Relative: 6 %
Neutro Abs: 14.2 10*3/uL — ABNORMAL HIGH (ref 1.7–7.7)
Neutrophils Relative %: 86 %
Platelets: 296 10*3/uL (ref 150–400)
RBC: 4.58 MIL/uL (ref 3.87–5.11)
RDW: 17.8 % — ABNORMAL HIGH (ref 11.5–15.5)
WBC: 16.6 10*3/uL — ABNORMAL HIGH (ref 4.0–10.5)
nRBC: 0 % (ref 0.0–0.2)

## 2020-11-15 LAB — GLUCOSE, CAPILLARY
Glucose-Capillary: 116 mg/dL — ABNORMAL HIGH (ref 70–99)
Glucose-Capillary: 180 mg/dL — ABNORMAL HIGH (ref 70–99)
Glucose-Capillary: 112 mg/dL — ABNORMAL HIGH (ref 70–99)

## 2020-11-15 LAB — BASIC METABOLIC PANEL
Anion gap: 6 (ref 5–15)
BUN: 65 mg/dL — ABNORMAL HIGH (ref 8–23)
CO2: 23 mmol/L (ref 22–32)
Calcium: 8.6 mg/dL — ABNORMAL LOW (ref 8.9–10.3)
Chloride: 109 mmol/L (ref 98–111)
Creatinine, Ser: 1.8 mg/dL — ABNORMAL HIGH (ref 0.44–1.00)
GFR, Estimated: 28 mL/min — ABNORMAL LOW (ref >60)
Glucose, Bld: 155 mg/dL — ABNORMAL HIGH (ref 70–99)
Potassium: 4.7 mmol/L (ref 3.5–5.1)
Sodium: 138 mmol/L (ref 135–145)

## 2020-11-15 MED ORDER — ENSURE ENLIVE PO LIQD
237.0000 mL | ORAL | Status: DC
  Administered 2020-11-15 – 2020-11-16 (×2): 237 mL via ORAL

## 2020-11-15 MED ORDER — IPRATROPIUM-ALBUTEROL 0.5-2.5 (3) MG/3ML IN SOLN
3.0000 mL | Freq: Three times a day (TID) | RESPIRATORY_TRACT | Status: DC
  Administered 2020-11-15 – 2020-11-17 (×7): 3 mL via RESPIRATORY_TRACT
  Filled 2020-11-15 (×8): qty 3

## 2020-11-15 MED ORDER — IPRATROPIUM-ALBUTEROL 0.5-2.5 (3) MG/3ML IN SOLN: 3.0000 mL | Freq: Four times a day (QID) | RESPIRATORY_TRACT | Status: DC | PRN

## 2020-11-15 MED ORDER — CHLORHEXIDINE GLUCONATE CLOTH 2 % EX PADS
6.0000 | MEDICATED_PAD | Freq: Every day | CUTANEOUS | Status: DC
  Administered 2020-11-15 – 2020-11-17 (×3): 6 via TOPICAL

## 2020-11-15 MED ORDER — GUAIFENESIN-DM 100-10 MG/5ML PO SYRP
5.0000 mL | ORAL_SOLUTION | ORAL | Status: DC | PRN
  Administered 2020-11-15 – 2020-11-16 (×2): 5 mL via ORAL
  Filled 2020-11-15 (×2): qty 5

## 2020-11-15 MED ORDER — BENZONATATE 100 MG PO CAPS
200.0000 mg | ORAL_CAPSULE | Freq: Two times a day (BID) | ORAL | Status: DC | PRN
  Administered 2020-11-15: 200 mg via ORAL
  Filled 2020-11-15 (×3): qty 2

## 2020-11-15 NOTE — Progress Notes (Signed)
Initial Nutrition Assessment  DOCUMENTATION CODES:  Not applicable  INTERVENTION:  Continue current diet as ordered, encourage PO intake Request new measured weight to assess for loss Ensure Enlive po 1x/d, each supplement provides 350 kcal and 20 grams of protein  NUTRITION DIAGNOSIS:  Inadequate oral intake related to decreased appetite as evidenced by per patient/family report.  GOAL:  Patient will meet greater than or equal to 90% of their needs  MONITOR:  PO intake, Supplement acceptance  REASON FOR ASSESSMENT:  Consult, Malnutrition Screening Tool Assessment of nutrition requirement/status  ASSESSMENT:  80 y.o. female with past medical history of CAD, COPD, HTN, HLD, GERD, CHF, and breast cancer presented to ED with worsening SOB.  Hx of recurrent pneumonia pt reports she feels similar to her previous episodes.  Workup in ED suggestive of COPD exacerbation and pneumonia. Also found to have AKI.  Pt meeting with other providers x 2 attempted visits. Will defer nutrition interview and exam to follow-up.   Reviewed chart. Intake of meals is 100%, but limited meals documented. UBW appears to be about 150 lb per review of PCP notes over the last few months. Currently documented at 139 lb, will request new measured weight to assess for accuracy.   Therapies have recommended pt dc to SNF, but pt has opted for home with home health services.    Average Meal Intake: 7/23-7/25: 100% intake x 1 recorded meal  Nutritionally Relevant Medications: Scheduled Meds:  cholecalciferol  1,000 Units Oral Daily   letrozole  2.5 mg Oral Daily   magnesium oxide  200 mg Oral Daily   montelukast  10 mg Oral QHS   pantoprazole  40 mg Oral Daily   predniSONE  40 mg Oral Q breakfast   rosuvastatin  20 mg Oral QPM   Continuous Infusions:  levofloxacin (LEVAQUIN) IV 500 mg (11/15/20 1142)   Labs Reviewed: BUN 65, creatinine 1.8  NUTRITION - FOCUSED PHYSICAL EXAM: Defer to in-person  assessment  Diet Order:   Diet Order             Diet regular Room service appropriate? Yes; Fluid consistency: Thin  Diet effective now                   EDUCATION NEEDS:  No education needs have been identified at this time  Skin:  Skin Assessment: Reviewed RN Assessment  Last BM:  PTA  Height:  Ht Readings from Last 1 Encounters:  11/13/20 '5\' 2"'$  (1.575 m)    Weight:  Wt Readings from Last 1 Encounters:  11/13/20 63 kg    Ideal Body Weight:  50 kg  BMI:  Body mass index is 25.4 kg/m.  Estimated Nutritional Needs:  Kcal:  1500-1700 kcal/d Protein:  75-90 g/d Fluid:  >1.5L/d  Ranell Patrick, RD, LDN Clinical Dietitian Pager on [employer]

## 2020-11-15 NOTE — Progress Notes (Signed)
Due to his PROGRESS NOTE    Krista Cisneros  [id number] DOB: [birth year] DOA: 11/13/2020 PCP: Sharyne Peach, MD   Brief Narrative:  HPI: Krista Cisneros is a 80 y.o. female with a past medical history of COPD/severe emphysema not on home oxygen supplementation, coronary artery disease status post PCI, CHF with reduced ejection fraction, hypertension, hyperlipidemia, depression/anxiety, history of recurrent pneumonia, former tobacco user, history of right breast cancer, GERD.  The patient presented to the emergency department due to generalized weakness, some chills.  She has been feeling short of breath for the past few days.  Also having worsening dry cough.  She was satting 86% when EMS arrived.  89% on room air in the emergency department.  Now on 2 L and saturating in the mid 90s.  Granddaughter is present at bedside.  No fevers.  She had a fall secondary to generalized weakness today and fell on her side and hit the bridge of her nose as well.  She is having shortness of breath at rest.  Worsened with exertion.  No alleviating factors.  She has received the COVID-19 vaccine and booster.  Last pneumonia was approximately 2 months ago.  En-route via EMS she received 125 mg of Solu-Medrol, 2 nebulizer treatments with duo nebs.  Upon my evaluation she is not in any respiratory distress.  She was getting home health physical therapy and was doing well with it but recently after she was progressing well the granddaughter states that it was discontinued.       ED Course: Lab work obtained.  Chest x-ray obtained.  Albuterol 2.5 mg neb treatment x2, DuoNeb 3 mL neb treatment x1, normal saline 1 L bolus.  Levaquin 750 mg IV x1.  Blood cultures also obtained.  Assessment & Plan:   Active Problems:   Essential hypertension   Acute kidney injury ([place])   Coronary artery disease involving native coronary artery of native heart with angina pectoris ([place])   Community acquired pneumonia    Chronic diastolic CHF (congestive heart failure) ([place])   COPD with acute exacerbation (HCC)   Acute respiratory failure with hypoxia (HCC)  Sepsis and acute hypoxic respiratory failure secondary to COPD exacerbation and community-acquired pneumonia:  --leukocytosis and tachypnea.  --weaned down to RA today Plan: --cont Levaqin --cont steroid --cont nebs    Acute kidney injury  on chronic kidney disease stage IIIa/IIIb:  Initial serum creatinine 2.18.  Baseline appears to be around 1.2-1.6.   --Cr improved with gentle IVF --Hold further IVF and encourage oral hydration   History of breast cancer: Continue home Femara 2.5 mg daily   CHF with reduced ejection fraction:  Not in acute decompensation.  Continue home Coreg 3.125 mg twice daily.   --hold home aldactone, lasix and Vasotec 2/2 AKI   Essential hypertension: Controlled.   Continue Coreg  --hold home aldactone, lasix and Vasotec 2/2 AKI   Hyperlipidemia:  Continue home Crestor 20 mg daily   Coronary artery disease status post PCI: Asymptomatic.  Continue home Plavix 75 mg daily   Depression/anxiety:  Continue home duloxetine 90 mg daily   GERD:  Continue home Protonix 40 mg daily   DVT prophylaxis: heparin injection 5,000 Units Start: 11/13/20 2200   Code Status: Prior  Family Communication:  family updated at bedside today  Status is: Inpatient  Dispo: The patient is from: Home              Anticipated d/c is to: Home  Patient currently is not medically stable to d/c.   Difficult to place patient No   Estimated body mass index is 25.4 kg/m as calculated from the following:   Height as of this encounter: '5\' 2"'$  (1.575 m).   Weight as of this encounter: 63 kg.      Nutritional status:  Nutrition Problem: Inadequate oral intake Etiology: decreased appetite   Signs/Symptoms: per patient/family report   Interventions: Refer to RD note for recommendations    Consultants:   None  Procedures:  None  Antimicrobials:  Anti-infectives (From admission, onward)    Start     Dose/Rate Route Frequency Ordered Stop   11/15/20 1000  levofloxacin (LEVAQUIN) IVPB 500 mg        500 mg 100 mL/hr over 60 Minutes Intravenous Every 48 hours 11/13/20 1551 11/19/20 0959   11/14/20 1000  levofloxacin (LEVAQUIN) IVPB 750 mg  Status:  Discontinued        750 mg 100 mL/hr over 90 Minutes Intravenous Every 24 hours 11/13/20 1508 11/13/20 1551   11/13/20 1400  levofloxacin (LEVAQUIN) IVPB 750 mg        750 mg 100 mL/hr over 90 Minutes Intravenous  Once 11/13/20 1350 11/13/20 1636          Subjective: Pt reported breathing better.  No bladder pain, mild burning with urination.   Drinking more fluids now.   Objective: Vitals:   11/15/20 0733 11/15/20 0753 11/15/20 1413 11/15/20 1800  BP: (!) 146/88   (!) 119/55  Pulse: 80 93  97  Resp: '16 18  18  '$ Temp: 97.8 F (36.6 C)   98 F (36.7 C)  TempSrc:    Oral  SpO2: 93% 95% 93% 92%  Weight:      Height:        Intake/Output Summary (Last 24 hours) at 11/15/2020 1918 Last data filed at 11/15/2020 1843 Gross per 24 hour  Intake 420 ml  Output 300 ml  Net 120 ml   Filed Weights   11/13/20 1249  Weight: 63 kg    Examination:  Constitutional: NAD, AAOx3 HEENT: conjunctivae and lids normal, EOMI CV: No cyanosis.   RESP: diffuse low-pitched wheezes, on RA Extremities: No effusions, edema in BLE SKIN: warm, dry Neuro: II - XII grossly intact.   Psych: Normal mood and affect.     Data Reviewed: I have personally reviewed following labs and imaging studies  CBC: Recent Labs  Lab 11/13/20 1301 11/14/20 0708 11/15/20 0413  WBC 17.5* 11.2* 16.6*  NEUTROABS 12.5* 9.9* 14.2*  HGB 12.5 11.7* 11.9*  HCT 40.8 37.9 38.4  MCV 85.0 85.4 83.8  PLT 263 272 0000000   Basic Metabolic Panel: Recent Labs  Lab 11/13/20 1301 11/14/20 0708 11/15/20 0413  NA 134* 135 138  K 4.1 4.1 4.7  CL 101 101 109  CO2 20*  21* 23  GLUCOSE 115* 143* 155*  BUN 61* 56* 65*  CREATININE 2.18* 1.85* 1.80*  CALCIUM 8.3* 8.6* 8.6*   GFR: Estimated Creatinine Clearance: 22.1 mL/min (A) (by C-G formula based on SCr of 1.8 mg/dL (H)). Liver Function Tests: Recent Labs  Lab 11/13/20 1301  AST 57*  ALT 27  ALKPHOS 51  BILITOT 0.9  PROT 7.0  ALBUMIN 2.7*   No results for input(s): LIPASE, AMYLASE in the last 168 hours. No results for input(s): AMMONIA in the last 168 hours. Coagulation Profile: Recent Labs  Lab 11/13/20 1301  INR 1.1   Cardiac Enzymes: No results for  input(s): CKTOTAL, CKMB, CKMBINDEX, TROPONINI in the last 168 hours. BNP (last 3 results) No results for input(s): PROBNP in the last 8760 hours. HbA1C: No results for input(s): HGBA1C in the last 72 hours. CBG: Recent Labs  Lab 11/15/20 0731 11/15/20 1138 11/15/20 1720  GLUCAP 116* 180* 112*   Lipid Profile: No results for input(s): CHOL, HDL, LDLCALC, TRIG, CHOLHDL, LDLDIRECT in the last 72 hours. Thyroid Function Tests: No results for input(s): TSH, T4TOTAL, FREET4, T3FREE, THYROIDAB in the last 72 hours. Anemia Panel: No results for input(s): VITAMINB12, FOLATE, FERRITIN, TIBC, IRON, RETICCTPCT in the last 72 hours. Sepsis Labs: Recent Labs  Lab 11/13/20 1301 11/13/20 1700  PROCALCITON  --  1.04  LATICACIDVEN 1.2  --     Recent Results (from the past 240 hour(s))  Culture, blood (Routine x 2)     Status: None (Preliminary result)   Collection Time: 11/13/20  1:01 PM   Specimen: BLOOD LEFT HAND  Result Value Ref Range Status   Specimen Description BLOOD LEFT HAND  Final   Special Requests   Final    BOTTLES DRAWN AEROBIC AND ANAEROBIC Blood Culture results may not be optimal due to an inadequate volume of blood received in culture bottles   Culture   Final    NO GROWTH 2 DAYS Performed at [facility], [address] [place], [place] [address]    Report Status PENDING  Incomplete  Resp Panel by RT-PCR  (Flu A&B, Covid) Nasopharyngeal Swab     Status: None   Collection Time: 11/13/20  2:20 PM   Specimen: Nasopharyngeal Swab; Nasopharyngeal(NP) swabs in vial transport medium  Result Value Ref Range Status   SARS Coronavirus 2 by RT PCR NEGATIVE NEGATIVE Final    Comment: (NOTE) SARS-CoV-2 target nucleic acids are NOT DETECTED.  The SARS-CoV-2 RNA is generally detectable in upper respiratory specimens during the acute phase of infection. The lowest concentration of SARS-CoV-2 viral copies this assay can detect is 138 copies/mL. A negative result does not preclude SARS-Cov-2 infection and should not be used as the sole basis for treatment or other patient management decisions. A negative result may occur with  improper specimen collection/handling, submission of specimen other than nasopharyngeal swab, presence of viral mutation(s) within the areas targeted by this assay, and inadequate number of viral copies(<138 copies/mL). A negative result must be combined with clinical observations, patient history, and epidemiological information. The expected result is Negative.  Fact Sheet for Patients:  EntrepreneurPulse.com.au  Fact Sheet for Healthcare Providers:  IncredibleEmployment.be  This test is no t yet approved or cleared by the [place] FDA and  has been authorized for detection and/or diagnosis of SARS-CoV-2 by FDA under an Emergency Use Authorization (EUA). This EUA will remain  in effect (meaning this test can be used) for the duration of the COVID-19 declaration under Section 564(b)(1) of the Act, 21 U.S.C.section 360bbb-3(b)(1), unless the authorization is terminated  or revoked sooner.       Influenza A by PCR NEGATIVE NEGATIVE Final   Influenza B by PCR NEGATIVE NEGATIVE Final    Comment: (NOTE) The Xpert Xpress SARS-CoV-2/FLU/RSV plus assay is intended as an aid in the diagnosis of influenza from Nasopharyngeal swab specimens  and should not be used as a sole basis for treatment. Nasal washings and aspirates are unacceptable for Xpert Xpress SARS-CoV-2/FLU/RSV testing.  Fact Sheet for Patients: EntrepreneurPulse.com.au  Fact Sheet for Healthcare Providers: IncredibleEmployment.be  This test is not yet approved or cleared by the  [place] Architectural technologist and has been authorized for detection and/or diagnosis of SARS-CoV-2 by FDA under an Print production planner (EUA). This EUA will remain in effect (meaning this test can be used) for the duration of the COVID-19 declaration under Section 564(b)(1) of the Act, 21 U.S.C. section 360bbb-3(b)(1), unless the authorization is terminated or revoked.  Performed at [facility], [address] [place], [place] [address]   Culture, blood (Routine X 2) w Reflex to ID Panel     Status: None (Preliminary result)   Collection Time: 11/14/20  7:08 AM   Specimen: BLOOD  Result Value Ref Range Status   Specimen Description BLOOD LEFT ANTECUBITAL  Final   Special Requests   Final    BOTTLES DRAWN AEROBIC AND ANAEROBIC Blood Culture adequate volume   Culture   Final    NO GROWTH 1 DAY Performed at [facility], [address] [place], [place] [address]    Report Status PENDING  Incomplete      Radiology Studies: CT HEAD WO CONTRAST  Result Date: 11/13/2020 CLINICAL DATA:  Fall, shortness of breath, weakness EXAM: CT HEAD WITHOUT CONTRAST TECHNIQUE: Contiguous axial images were obtained from the base of the skull through the vertex without intravenous contrast. COMPARISON:  None. FINDINGS: Brain: No evidence of acute infarction, hemorrhage, hydrocephalus, extra-axial collection, visible mass lesion or mass effect. Symmetric prominence of the ventricles, cisterns and sulci compatible with parenchymal volume loss. Patchy areas of white matter hypoattenuation are most compatible with chronic microvascular angiopathy.  Vascular: Atherosclerotic calcification of the carotid siphons and intradural vertebral arteries. No hyperdense vessel. Skull: No calvarial fracture or suspicious osseous lesion. No scalp swelling or hematoma. Sinuses/Orbits: Layering air-fluid level seen in the maxillary sinuses. More mild mural thickening in the ethmoids. Mastoid air cells are clear. Middle ear cavities are clear. Included orbital structures are unremarkable. Other: None IMPRESSION: No acute intracranial abnormality. Background of intracranial atherosclerosis, microvascular angiopathy and parenchymal volume loss. Layering air-fluid levels in the maxillary sinuses, may correlate for clinical features of acute sinusitis. Electronically Signed   By: Lovena Le M.D.   On: 11/13/2020 19:57    Scheduled Meds:  carvedilol  6.25 mg Oral BID   Chlorhexidine Gluconate Cloth  6 each Topical Daily   cholecalciferol  1,000 Units Oral Daily   clopidogrel  75 mg Oral Q breakfast   DULoxetine  90 mg Oral Daily   feeding supplement  237 mL Oral Q24H   guaiFENesin  600 mg Oral BID   heparin injection (subcutaneous)  5,000 Units Subcutaneous Q8H   ipratropium-albuterol  3 mL Nebulization TID   letrozole  2.5 mg Oral Daily   loratadine  5 mg Oral Daily   magnesium oxide  200 mg Oral Daily   montelukast  10 mg Oral QHS   pantoprazole  40 mg Oral Daily   predniSONE  40 mg Oral Q breakfast   rosuvastatin  20 mg Oral QPM   umeclidinium-vilanterol  1 puff Inhalation Daily   Continuous Infusions:  levofloxacin (LEVAQUIN) IV 500 mg (11/15/20 1142)     LOS: 2 days    Enzo Bi, MD [facility]  11/15/2020, 7:18 PM

## 2020-11-15 NOTE — TOC Initial Note (Signed)
Transition of Care [facility]) - Initial/Assessment Note    Patient Details  Name: Krista Cisneros MRN: [id number] Date of Birth: 28-Aug-1940  Transition of Care [facility]) CM/SW Contact:    Beverly Sessions, RN Phone Number: 11/15/2020, 4:03 PM  Clinical Narrative:                  Patient admitted from home wit COPD Patient lives at home with daughter and son in law Daughter at bedside  PCP Iona Beard - [facility] transports [facility] - denies issues obtaining medications  PT and OT recommend SNF Patient declines SNF.  Daughter supports her in this decision.  Agreeable to home health .  States she does not have a preference of home health agency.  Referral made and accepted by [facility] with [facility]  Patient has RW, BSC and cane at home        Patient Goals and CMS Choice        Expected Discharge Plan and Services                                                Prior Living Arrangements/Services                       Activities of Daily Living Home Assistive Devices/Equipment: Cane (specify quad or straight), CBG Meter, Walker (specify type) ADL Screening (condition at time of admission) Patient's cognitive ability adequate to safely complete daily activities?: Yes Is the patient deaf or have difficulty hearing?: No Does the patient have difficulty seeing, even when wearing glasses/contacts?: Yes Does the patient have difficulty concentrating, remembering, or making decisions?: Yes Patient able to express need for assistance with ADLs?: Yes Does the patient have difficulty dressing or bathing?: No Independently performs ADLs?: Yes (appropriate for developmental age) Does the patient have difficulty walking or climbing stairs?: No Weakness of Legs: Both Weakness of Arms/Hands: Both  Permission Sought/Granted                  Emotional Assessment              Admission diagnosis:  COPD exacerbation ([place]) [J44.1] Acute respiratory failure  with hypoxia ([place]) [J96.01] COPD with acute exacerbation ([place]) [J44.1] Sepsis ([place]) [A41.9] Community acquired pneumonia, unspecified laterality [J18.9] Patient Active Problem List   Diagnosis Date Noted   Acute respiratory failure with hypoxia ([place]) 11/14/2020   COPD with acute exacerbation ([place]) 11/13/2020   Community acquired pneumonia 06/17/2020   Generalized weakness 06/17/2020   History of COVID-19, December 2021 06/17/2020   History of breast cancer 06/17/2020   Chronic diastolic CHF (congestive heart failure) ([place]) 06/17/2020   Stage 3a chronic kidney disease ([place]) 06/17/2020   Sepsis due to Escherichia coli (E. coli) ([place]) 10/10/2019   Acute lower UTI 10/10/2019   SIRS (systemic inflammatory response syndrome) ([place]) 10/07/2019   Constipation 10/07/2019   Urinary retention 10/07/2019   Iron deficiency anemia 05/30/2019   Ischemic cardiomyopathy 06/03/2018   Coronary artery disease involving native coronary artery of native heart with angina pectoris ([place]) [id number]   Chronic systolic heart failure ([place])    Coronary artery disease involving native coronary artery of native heart without angina pectoris 04/22/2018   Non-STEMI (non-ST elevated myocardial infarction) ([place]) 04/22/2018   NSTEMI (non-ST elevated myocardial infarction) ([place]) 04/09/2018   Rectal bleeding 03/22/2018  Malignant neoplasm of upper-outer quadrant of right breast in female, estrogen receptor negative ([place]) 06/19/2017   Incarcerated hernia 03/26/2017   Incarcerated inguinal hernia    Acute kidney injury ([place]) 02/07/2017   Hypomagnesemia 01/30/2017   Goals of care, counseling/discussion 10/20/2016   Malignant neoplasm of right female breast ([place]) 10/16/2016   Hyperlipidemia, mixed 10/08/2016   Chronic venous insufficiency 09/14/2016   GERD (gastroesophageal reflux disease) 09/14/2016   Pain in limb 05/29/2016   Essential hypertension 05/29/2016   COPD (chronic obstructive pulmonary disease) ([place])  05/29/2016   Hardening of the aorta (main artery of the heart) ([place]) 01/07/2016   Hyperglycemia, unspecified 01/07/2016   Neuropathy 11/15/2015   Severe recurrent major depression without psychotic features ([place]) 09/10/2015   Allergic rhinitis 04/10/2014   Depression 04/10/2014   Obesity, unspecified 04/10/2014   PCP:  Sharyne Peach, MD Pharmacy:   [id number] [place] [place] [id number]: [phone]: [phone]    Social Determinants of Health (SDOH) Interventions    Readmission Risk Interventions Readmission Risk Prevention Plan 11/15/2020 10/09/2019  Transportation Screening Complete Complete  Medication Review (Press photographer Complete Complete  HRI or Home Care Consult Complete -  SW Recovery Care/Counseling Consult Complete -  Palliative Care Screening Complete Not AWinonaPatient Refused Not Applicable  Some recent data might be hidden

## 2020-11-16 DIAGNOSIS — J189 Pneumonia, unspecified organism: Secondary | ICD-10-CM

## 2020-11-16 LAB — CBC
HCT: 38 % (ref 36.0–46.0)
Hemoglobin: 11.9 g/dL — ABNORMAL LOW (ref 12.0–15.0)
MCH: 26.1 pg (ref 26.0–34.0)
MCHC: 31.3 g/dL (ref 30.0–36.0)
MCV: 83.3 fL (ref 80.0–100.0)
Platelets: 313 10*3/uL (ref 150–400)
RBC: 4.56 MIL/uL (ref 3.87–5.11)
RDW: 17.7 % — ABNORMAL HIGH (ref 11.5–15.5)
WBC: 16.5 10*3/uL — ABNORMAL HIGH (ref 4.0–10.5)
nRBC: 0 % (ref 0.0–0.2)

## 2020-11-16 LAB — RESP PANEL BY RT-PCR (FLU A&B, COVID) ARPGX2
Influenza A by PCR: NEGATIVE
Influenza B by PCR: NEGATIVE
SARS Coronavirus 2 by RT PCR: NEGATIVE

## 2020-11-16 LAB — BASIC METABOLIC PANEL
Anion gap: 6 (ref 5–15)
BUN: 56 mg/dL — ABNORMAL HIGH (ref 8–23)
CO2: 24 mmol/L (ref 22–32)
Calcium: 8.6 mg/dL — ABNORMAL LOW (ref 8.9–10.3)
Chloride: 108 mmol/L (ref 98–111)
Creatinine, Ser: 1.6 mg/dL — ABNORMAL HIGH (ref 0.44–1.00)
GFR, Estimated: 33 mL/min — ABNORMAL LOW (ref >60)
Glucose, Bld: 107 mg/dL — ABNORMAL HIGH (ref 70–99)
Potassium: 3.9 mmol/L (ref 3.5–5.1)
Sodium: 138 mmol/L (ref 135–145)

## 2020-11-16 LAB — GLUCOSE, CAPILLARY
Glucose-Capillary: 84 mg/dL (ref 70–99)
Glucose-Capillary: 127 mg/dL — ABNORMAL HIGH (ref 70–99)
Glucose-Capillary: 175 mg/dL — ABNORMAL HIGH (ref 70–99)

## 2020-11-16 LAB — MAGNESIUM: Magnesium: 2.1 mg/dL (ref 1.7–2.4)

## 2020-11-16 NOTE — Progress Notes (Signed)
Occupational Therapy Treatment Patient Details Name: Krista Cisneros MRN: [id number] DOB: May 12, 1940 Today's Date: 11/16/2020    History of present illness Krista Cisneros is a 80 y.o. female with a past medical history of COPD/severe emphysema not on home oxygen supplementation, coronary artery disease status post PCI, CHF with reduced ejection fraction, hypertension, hyperlipidemia, depression/anxiety, history of recurrent pneumonia, former tobacco user, history of right breast cancer, GERD.  The patient presented to the emergency department 11/13/20 due to generalized weakness, following fall. Patient admitted for acute hypoxic respiratory failure secondary to COPD/severe emphysema exacerbation and community-aquired pneumonia with sepsis.   OT comments  Ms. Balistreri was seen for OT treatment on this date. Upon arrival to pt room, pt awake alert seated EOB with breakfast tray. Pt agreeable to OT tx session. OT facilitates education on energy conservation strategies includin 4 p's, pursed lip breathing, falls prevention strategies, safe use of AE/DME for ADL management and routines modifications to support safety and functional independence. Handout provided. Pt requires MIN A for LB ADL management and supervision for safety during functional mobility this date. Education provided during functional activities including LB dressing, peri-care, and functional mobility. See ADL section for additional details. Pt return demonstrated understanding of instruction provided during functional tasks. Pt making good progress toward goals. Pt continues to benefit from skilled OT services to maximize return to PLOF and minimize risk of future falls, injury, caregiver burden, and readmission. Will continue to follow POC. Discharge recommendation remains appropriate.    Follow Up Recommendations  SNF    Equipment Recommendations  None recommended by OT (Pt has necessary equipment.)    Recommendations for Other  Services      Precautions / Restrictions Precautions Precautions: Fall Precaution Comments: left port access Restrictions Weight Bearing Restrictions: No       Mobility Bed Mobility Overal bed mobility: Needs Assistance Bed Mobility: Supine to Sit     Supine to sit: Modified independent (Device/Increase time);HOB elevated          Transfers Overall transfer level: Needs assistance Equipment used: Rolling walker (2 wheeled) Transfers: Sit to/from Stand Sit to Stand: Supervision;Min guard         General transfer comment: Pt performs STS/functional mobility in room with close min guard progressing to supervision during sesion.    Balance Overall balance assessment: Needs assistance Sitting-balance support: No upper extremity supported;Feet supported Sitting balance-Leahy Scale: Good Sitting balance - Comments: Steady static/dynamic sitting EOB. No LOB apreciated during weight shift for functional activities.     Standing balance-Leahy Scale: Good                             ADL either performed or assessed with clinical judgement   ADL Overall ADL's : Needs assistance/impaired                     Lower Body Dressing: Sit to/from stand;Cueing for safety;With adaptive equipment;Minimal assistance Lower Body Dressing Details (indicate cue type and reason): MOD A to doff/don bilat hospital socks and brief while seated EOB. Pt requires increased time/effort to perform. MIN A to facilitate threading sock over RLE and assist with doffing brief. Pt experiences episode of bladder incontinenece and requires assist to clean up during LB dressing. Toilet Transfer: RW;Set up;Supervision/safety;BSC   Toileting- Water quality scientist and Hygiene: Sit to/from stand;Set up;Supervision/safety       Functional mobility during ADLs: Rolling walker;Cueing for safety;Min guard;Set up;Supervision/safety General  ADL Comments: Pt initially close min guard for  functional mobility. Able to progress to supervision level with RW during session.     Vision Baseline Vision/History: Wears glasses Patient Visual Report: No change from baseline     Perception     Praxis      Cognition Arousal/Alertness: Awake/alert Behavior During Therapy: WFL for tasks assessed/performed Overall Cognitive Status: Impaired/Different from baseline                                 General Comments: confused reporting she was in [place]        Exercises Other Exercises Other Exercises: OT facilitates education on energy conservation strategies includin 4 p's, pursed lip breathing, falls prevention strategies and routines modifications to support safety and functional independence. Education provided during functional activities including LB dressing, peri-care, and functional mobility. See ADL section for additional details.   Shoulder Instructions       General Comments VSS t/o session. HR/SO2 monitored and remain WFL during functional tasks with pt on RA (SO2 94-99%, HR 90-100's)    Pertinent Vitals/ Pain       Pain Assessment: No/denies pain  Home Living                                          Prior Functioning/Environment              Frequency  Min 1X/week        Progress Toward Goals  OT Goals(current goals can now be found in the care plan section)  Progress towards OT goals: Progressing toward goals  Acute Rehab OT Goals Patient Stated Goal: to go home OT Goal Formulation: With patient Time For Goal Achievement: 11/28/20 Potential to Achieve Goals: [place] Discharge plan remains appropriate;Frequency remains appropriate    Co-evaluation                 AM-PAC OT "6 Clicks" Daily Activity     Outcome Measure   Help from another person eating meals?: None Help from another person taking care of personal grooming?: A Little Help from another person toileting, which includes using  toliet, bedpan, or urinal?: A Little Help from another person bathing (including washing, rinsing, drying)?: A Little Help from another person to put on and taking off regular upper body clothing?: A Little Help from another person to put on and taking off regular lower body clothing?: A Little 6 Click Score: 19    End of Session Equipment Utilized During Treatment: Gait belt;Rolling walker  OT Visit Diagnosis: Muscle weakness (generalized) (M62.81);History of falling (Z91.81)   Activity Tolerance Patient tolerated treatment well   Patient Left in bed;with call bell/phone within reach (bed alarm not set upon arrival to pt room, pt preference sitting EOB. Nsg aware.)   Nurse Communication          Time: 1001-1041 OT Time Calculation (min): 40 min  Charges: OT General Charges $OT Visit: 1 Visit OT Treatments $Self Care/Home Management : 38-52 mins  Shara Blazing, M.S., OTR/L Ascom: [phone] 11/16/20, 1:46 PM

## 2020-11-16 NOTE — Care Management Important Message (Signed)
Important Message  Patient Details  Name: Krista Cisneros MRN: [id number] Date of Birth: 03/29/1941   Medicare Important Message Given:  Yes     Dannette Barbara 11/16/2020, 3:59 PM

## 2020-11-16 NOTE — H&P (View-Only) (Signed)
Due to his PROGRESS NOTE    Krista Cisneros  [id number] DOB: [birth year] DOA: 11/13/2020 PCP: Sharyne Peach, MD   Brief Narrative:  HPI: Krista Cisneros is a 80 y.o. female with a past medical history of COPD/severe emphysema not on home oxygen supplementation, coronary artery disease status post PCI, CHF with reduced ejection fraction, hypertension, hyperlipidemia, depression/anxiety, history of recurrent pneumonia, former tobacco user, history of right breast cancer, GERD.  The patient presented to the emergency department due to generalized weakness, some chills.  She has been feeling short of breath for the past few days.  Also having worsening dry cough.  She was satting 86% when EMS arrived.  89% on room air in the emergency department.  Now on 2 L and saturating in the mid 90s.  Granddaughter is present at bedside.  No fevers.  She had a fall secondary to generalized weakness today and fell on her side and hit the bridge of her nose as well.  She is having shortness of breath at rest.  Worsened with exertion.  No alleviating factors.  She has received the COVID-19 vaccine and booster.  Last pneumonia was approximately 2 months ago.  En-route via EMS she received 125 mg of Solu-Medrol, 2 nebulizer treatments with duo nebs.  Upon my evaluation she is not in any respiratory distress.  She was getting home health physical therapy and was doing well with it but recently after she was progressing well the granddaughter states that it was discontinued.       ED Course: Lab work obtained.  Chest x-ray obtained.  Albuterol 2.5 mg neb treatment x2, DuoNeb 3 mL neb treatment x1, normal saline 1 L bolus.  Levaquin 750 mg IV x1.  Blood cultures also obtained.  Assessment & Plan:   Active Problems:   Essential hypertension   Acute kidney injury ([place])   Coronary artery disease involving native coronary artery of native heart with angina pectoris ([place])   Community acquired pneumonia    Chronic diastolic CHF (congestive heart failure) ([place])   COPD with acute exacerbation ([facility])   Acute respiratory failure with hypoxia ([facility])  Sepsis and acute hypoxic respiratory failure secondary to COPD exacerbation and community-acquired pneumonia:  --leukocytosis and tachypnea.  --weaned down to RA on 7/25 Plan: --cont Levaquin --cont steroid --cont nebs    Acute kidney injury  on chronic kidney disease stage IIIa/IIIb:  Initial serum creatinine 2.18.  Baseline appears to be around 1.2-1.6.   --Cr improved with gentle IVF --hold further IVF and encourage oral hydration   History of breast cancer:  Continue home Femara 2.5 mg daily   CHF with reduced ejection fraction:  Not in acute decompensation.   --cont home coreg --hold home aldactone, lasix and Vasotec 2/2 AKI   Essential hypertension: Controlled.   --cont home coreg --hold home aldactone, lasix and Vasotec 2/2 AKI   Hyperlipidemia:  Continue home Crestor 20 mg daily   Coronary artery disease status post PCI:  Asymptomatic.  Continue home Plavix 75 mg daily   Depression/anxiety:  Continue home duloxetine 90 mg daily   GERD:  Continue home Protonix 40 mg daily   DVT prophylaxis: heparin injection 5,000 Units Start: 11/13/20 2200   Code Status: Prior  Family Communication:  daughter updated at bedside today  Status is: Inpatient  Dispo: The patient is from: Home              Anticipated d/c is to: SNF  Patient currently is medically stable to d/c.   Difficult to place patient No   Estimated body mass index is 25.4 kg/m as calculated from the following:   Height as of this encounter: '5\' 2"'$  (1.575 m).   Weight as of this encounter: 63 kg.      Nutritional status:  Nutrition Problem: Inadequate oral intake Etiology: decreased appetite   Signs/Symptoms: per patient/family report   Interventions: Refer to RD note for recommendations    Consultants:  None  Procedures:   None  Antimicrobials:  Anti-infectives (From admission, onward)    Start     Dose/Rate Route Frequency Ordered Stop   11/15/20 1000  levofloxacin (LEVAQUIN) IVPB 500 mg        500 mg 100 mL/hr over 60 Minutes Intravenous Every 48 hours 11/13/20 1551 11/19/20 0959   11/14/20 1000  levofloxacin (LEVAQUIN) IVPB 750 mg  Status:  Discontinued        750 mg 100 mL/hr over 90 Minutes Intravenous Every 24 hours 11/13/20 1508 11/13/20 1551   11/13/20 1400  levofloxacin (LEVAQUIN) IVPB 750 mg        750 mg 100 mL/hr over 90 Minutes Intravenous  Once 11/13/20 1350 11/13/20 1636          Subjective: Pt reported doing ok.  No new complaints.  Now agreeable to SNF rehab.   Objective: Vitals:   11/16/20 0413 11/16/20 0738 11/16/20 1326 11/16/20 1518  BP: (!) 155/74 (!) 153/75  (!) 155/87  Pulse: 79 80 81 84  Resp: 18  16   Temp: 97.9 F (36.6 C) 97.9 F (36.6 C)  97.7 F (36.5 C)  TempSrc: Oral Oral  Oral  SpO2: 94% 95% 94% 92%  Weight:      Height:        Intake/Output Summary (Last 24 hours) at 11/16/2020 1823 Last data filed at 11/16/2020 1300 Gross per 24 hour  Intake 420 ml  Output 900 ml  Net -480 ml   Filed Weights   11/13/20 1249  Weight: 63 kg    Examination:  Constitutional: NAD, AAOx3 HEENT: conjunctivae and lids normal, EOMI CV: No cyanosis.   RESP: normal respiratory effort, no wheezing, on RA Extremities: No effusions, edema in BLE SKIN: warm, dry Neuro: II - XII grossly intact.   Psych: Normal mood and affect.  Appropriate judgement and reason    Data Reviewed: I have personally reviewed following labs and imaging studies  CBC: Recent Labs  Lab 11/13/20 1301 11/14/20 0708 11/15/20 0413 11/16/20 0506  WBC 17.5* 11.2* 16.6* 16.5*  NEUTROABS 12.5* 9.9* 14.2*  --   HGB 12.5 11.7* 11.9* 11.9*  HCT 40.8 37.9 38.4 38.0  MCV 85.0 85.4 83.8 83.3  PLT 263 272 296 [id number]   Basic Metabolic Panel: Recent Labs  Lab 11/13/20 1301 11/14/20 0708  11/15/20 0413 11/16/20 0506  NA 134* 135 138 138  K 4.1 4.1 4.7 3.9  CL 101 101 109 108  CO2 20* 21* 23 24  GLUCOSE 115* 143* 155* 107*  BUN 61* 56* 65* 56*  CREATININE 2.18* 1.85* 1.80* 1.60*  CALCIUM 8.3* 8.6* 8.6* 8.6*  MG  --   --   --  2.1   GFR: Estimated Creatinine Clearance: 24.9 mL/min (A) (by C-G formula based on SCr of 1.6 mg/dL (H)). Liver Function Tests: Recent Labs  Lab 11/13/20 1301  AST 57*  ALT 27  ALKPHOS 51  BILITOT 0.9  PROT 7.0  ALBUMIN 2.7*   No results  for input(s): LIPASE, AMYLASE in the last 168 hours. No results for input(s): AMMONIA in the last 168 hours. Coagulation Profile: Recent Labs  Lab 11/13/20 1301  INR 1.1   Cardiac Enzymes: No results for input(s): CKTOTAL, CKMB, CKMBINDEX, TROPONINI in the last 168 hours. BNP (last 3 results) No results for input(s): PROBNP in the last 8760 hours. HbA1C: No results for input(s): HGBA1C in the last 72 hours. CBG: Recent Labs  Lab 11/15/20 1138 11/15/20 1720 11/16/20 0737 11/16/20 1116 11/16/20 1651  GLUCAP 180* 112* 84 127* 175*   Lipid Profile: No results for input(s): CHOL, HDL, LDLCALC, TRIG, CHOLHDL, LDLDIRECT in the last 72 hours. Thyroid Function Tests: No results for input(s): TSH, T4TOTAL, FREET4, T3FREE, THYROIDAB in the last 72 hours. Anemia Panel: No results for input(s): VITAMINB12, FOLATE, FERRITIN, TIBC, IRON, RETICCTPCT in the last 72 hours. Sepsis Labs: Recent Labs  Lab 11/13/20 1301 11/13/20 1700  PROCALCITON  --  1.04  LATICACIDVEN 1.2  --     Recent Results (from the past 240 hour(s))  Culture, blood (Routine x 2)     Status: None (Preliminary result)   Collection Time: 11/13/20  1:01 PM   Specimen: BLOOD LEFT HAND  Result Value Ref Range Status   Specimen Description BLOOD LEFT HAND  Final   Special Requests   Final    BOTTLES DRAWN AEROBIC AND ANAEROBIC Blood Culture results may not be optimal due to an inadequate volume of blood received in culture  bottles   Culture   Final    NO GROWTH 3 DAYS Performed at [facility], [address] [place], [place] [address]    Report Status PENDING  Incomplete  Resp Panel by RT-PCR (Flu A&B, Covid) Nasopharyngeal Swab     Status: None   Collection Time: 11/13/20  2:20 PM   Specimen: Nasopharyngeal Swab; Nasopharyngeal(NP) swabs in vial transport medium  Result Value Ref Range Status   SARS Coronavirus 2 by RT PCR NEGATIVE NEGATIVE Final    Comment: (NOTE) SARS-CoV-2 target nucleic acids are NOT DETECTED.  The SARS-CoV-2 RNA is generally detectable in upper respiratory specimens during the acute phase of infection. The lowest concentration of SARS-CoV-2 viral copies this assay can detect is 138 copies/mL. A negative result does not preclude SARS-Cov-2 infection and should not be used as the sole basis for treatment or other patient management decisions. A negative result may occur with  improper specimen collection/handling, submission of specimen other than nasopharyngeal swab, presence of viral mutation(s) within the areas targeted by this assay, and inadequate number of viral copies(<138 copies/mL). A negative result must be combined with clinical observations, patient history, and epidemiological information. The expected result is Negative.  Fact Sheet for Patients:  EntrepreneurPulse.com.au  Fact Sheet for Healthcare Providers:  IncredibleEmployment.be  This test is no t yet approved or cleared by the [place] FDA and  has been authorized for detection and/or diagnosis of SARS-CoV-2 by FDA under an Emergency Use Authorization (EUA). This EUA will remain  in effect (meaning this test can be used) for the duration of the COVID-19 declaration under Section 564(b)(1) of the Act, 21 U.S.C.section 360bbb-3(b)(1), unless the authorization is terminated  or revoked sooner.       Influenza A by PCR NEGATIVE NEGATIVE Final   Influenza  B by PCR NEGATIVE NEGATIVE Final    Comment: (NOTE) The Xpert Xpress SARS-CoV-2/FLU/RSV plus assay is intended as an aid in the diagnosis of influenza from Nasopharyngeal swab specimens and should not  be used as a sole basis for treatment. Nasal washings and aspirates are unacceptable for Xpert Xpress SARS-CoV-2/FLU/RSV testing.  Fact Sheet for Patients: EntrepreneurPulse.com.au  Fact Sheet for Healthcare Providers: IncredibleEmployment.be  This test is not yet approved or cleared by the [place] FDA and has been authorized for detection and/or diagnosis of SARS-CoV-2 by FDA under an Emergency Use Authorization (EUA). This EUA will remain in effect (meaning this test can be used) for the duration of the COVID-19 declaration under Section 564(b)(1) of the Act, 21 U.S.C. section 360bbb-3(b)(1), unless the authorization is terminated or revoked.  Performed at [facility], [address] [place], [place] [address]   Culture, blood (Routine X 2) w Reflex to ID Panel     Status: None (Preliminary result)   Collection Time: 11/14/20  7:08 AM   Specimen: BLOOD  Result Value Ref Range Status   Specimen Description BLOOD LEFT ANTECUBITAL  Final   Special Requests   Final    BOTTLES DRAWN AEROBIC AND ANAEROBIC Blood Culture adequate volume   Culture   Final    NO GROWTH 2 DAYS Performed at [facility], [address] [place], [place] [address]    Report Status PENDING  Incomplete  Resp Panel by RT-PCR (Flu A&B, Covid) Nasopharyngeal Swab     Status: None   Collection Time: 11/16/20  3:06 PM   Specimen: Nasopharyngeal Swab; Nasopharyngeal(NP) swabs in vial transport medium  Result Value Ref Range Status   SARS Coronavirus 2 by RT PCR NEGATIVE NEGATIVE Final    Comment: (NOTE) SARS-CoV-2 target nucleic acids are NOT DETECTED.  The SARS-CoV-2 RNA is generally detectable in upper respiratory specimens during the acute  phase of infection. The lowest concentration of SARS-CoV-2 viral copies this assay can detect is 138 copies/mL. A negative result does not preclude SARS-Cov-2 infection and should not be used as the sole basis for treatment or other patient management decisions. A negative result may occur with  improper specimen collection/handling, submission of specimen other than nasopharyngeal swab, presence of viral mutation(s) within the areas targeted by this assay, and inadequate number of viral copies(<138 copies/mL). A negative result must be combined with clinical observations, patient history, and epidemiological information. The expected result is Negative.  Fact Sheet for Patients:  EntrepreneurPulse.com.au  Fact Sheet for Healthcare Providers:  IncredibleEmployment.be  This test is no t yet approved or cleared by the [place] FDA and  has been authorized for detection and/or diagnosis of SARS-CoV-2 by FDA under an Emergency Use Authorization (EUA). This EUA will remain  in effect (meaning this test can be used) for the duration of the COVID-19 declaration under Section 564(b)(1) of the Act, 21 U.S.C.section 360bbb-3(b)(1), unless the authorization is terminated  or revoked sooner.       Influenza A by PCR NEGATIVE NEGATIVE Final   Influenza B by PCR NEGATIVE NEGATIVE Final    Comment: (NOTE) The Xpert Xpress SARS-CoV-2/FLU/RSV plus assay is intended as an aid in the diagnosis of influenza from Nasopharyngeal swab specimens and should not be used as a sole basis for treatment. Nasal washings and aspirates are unacceptable for Xpert Xpress SARS-CoV-2/FLU/RSV testing.  Fact Sheet for Patients: EntrepreneurPulse.com.au  Fact Sheet for Healthcare Providers: IncredibleEmployment.be  This test is not yet approved or cleared by the [place] FDA and has been authorized for detection and/or diagnosis of  SARS-CoV-2 by FDA under an Emergency Use Authorization (EUA). This EUA will remain in effect (meaning this test can be used)  for the duration of the COVID-19 declaration under Section 564(b)(1) of the Act, 21 U.S.C. section 360bbb-3(b)(1), unless the authorization is terminated or revoked.  Performed at [facility], [address] [place], [place] [address]       Radiology Studies: No results found.  Scheduled Meds:  carvedilol  6.25 mg Oral BID   Chlorhexidine Gluconate Cloth  6 each Topical Daily   cholecalciferol  1,000 Units Oral Daily   clopidogrel  75 mg Oral Q breakfast   DULoxetine  90 mg Oral Daily   feeding supplement  237 mL Oral Q24H   guaiFENesin  600 mg Oral BID   heparin injection (subcutaneous)  5,000 Units Subcutaneous Q8H   ipratropium-albuterol  3 mL Nebulization TID   letrozole  2.5 mg Oral Daily   loratadine  5 mg Oral Daily   magnesium oxide  200 mg Oral Daily   montelukast  10 mg Oral QHS   pantoprazole  40 mg Oral Daily   predniSONE  40 mg Oral Q breakfast   rosuvastatin  20 mg Oral QPM   umeclidinium-vilanterol  1 puff Inhalation Daily   Continuous Infusions:  levofloxacin (LEVAQUIN) IV 500 mg (11/15/20 1142)     LOS: 3 days    Enzo Bi, MD [facility]  11/16/2020, 6:23 PM

## 2020-11-16 NOTE — TOC Progression Note (Signed)
Transition of Care [facility]) - Progression Note    Patient Details  Name: Krista Cisneros MRN: [id number] Date of Birth: Dec 24, 1940  Transition of Care [facility]) CM/SW Contact  Beverly Sessions, RN Phone Number: 11/16/2020, 2:55 PM  Clinical Narrative:    Insurance auth started in [place] portal MD to order repeat covid         Expected Discharge Plan and Services                                                 Social Determinants of Health (SDOH) Interventions    Readmission Risk Interventions Readmission Risk Prevention Plan 11/15/2020 10/09/2019  Transportation Screening Complete Complete  Medication Review Press photographer) Complete Complete  HRI or Home Care Consult Complete -  SW Recovery Care/Counseling Consult Complete -  Palliative Care Screening Complete Not [place] Patient Refused Not Applicable  Some recent data might be hidden

## 2020-11-16 NOTE — Progress Notes (Signed)
Due to his PROGRESS NOTE    Krista Cisneros  [id number] DOB: October 17, 1940 DOA: 11/13/2020 PCP: Sharyne Peach, MD   Brief Narrative:  HPI: Krista Cisneros is a 80 y.o. female with a past medical history of COPD/severe emphysema not on home oxygen supplementation, coronary artery disease status post PCI, CHF with reduced ejection fraction, hypertension, hyperlipidemia, depression/anxiety, history of recurrent pneumonia, former tobacco user, history of right breast cancer, GERD.  The patient presented to the emergency department due to generalized weakness, some chills.  She has been feeling short of breath for the past few days.  Also having worsening dry cough.  She was satting 86% when EMS arrived.  89% on room air in the emergency department.  Now on 2 L and saturating in the mid 90s.  Granddaughter is present at bedside.  No fevers.  She had a fall secondary to generalized weakness today and fell on her side and hit the bridge of her nose as well.  She is having shortness of breath at rest.  Worsened with exertion.  No alleviating factors.  She has received the COVID-19 vaccine and booster.  Last pneumonia was approximately 2 months ago.  En-route via EMS she received 125 mg of Solu-Medrol, 2 nebulizer treatments with duo nebs.  Upon my evaluation she is not in any respiratory distress.  She was getting home health physical therapy and was doing well with it but recently after she was progressing well the granddaughter states that it was discontinued.       ED Course: Lab work obtained.  Chest x-ray obtained.  Albuterol 2.5 mg neb treatment x2, DuoNeb 3 mL neb treatment x1, normal saline 1 L bolus.  Levaquin 750 mg IV x1.  Blood cultures also obtained.  Assessment & Plan:   Active Problems:   Essential hypertension   Acute kidney injury ([place])   Coronary artery disease involving native coronary artery of native heart with angina pectoris ([place])   Community acquired pneumonia    Chronic diastolic CHF (congestive heart failure) ([place])   COPD with acute exacerbation ([facility])   Acute respiratory failure with hypoxia ([facility])  Sepsis and acute hypoxic respiratory failure secondary to COPD exacerbation and community-acquired pneumonia:  --leukocytosis and tachypnea.  --weaned down to RA on 7/25 Plan: --cont Levaquin --cont steroid --cont nebs    Acute kidney injury  on chronic kidney disease stage IIIa/IIIb:  Initial serum creatinine 2.18.  Baseline appears to be around 1.2-1.6.   --Cr improved with gentle IVF --hold further IVF and encourage oral hydration   History of breast cancer:  Continue home Femara 2.5 mg daily   CHF with reduced ejection fraction:  Not in acute decompensation.   --cont home coreg --hold home aldactone, lasix and Vasotec 2/2 AKI   Essential hypertension: Controlled.   --cont home coreg --hold home aldactone, lasix and Vasotec 2/2 AKI   Hyperlipidemia:  Continue home Crestor 20 mg daily   Coronary artery disease status post PCI:  Asymptomatic.  Continue home Plavix 75 mg daily   Depression/anxiety:  Continue home duloxetine 90 mg daily   GERD:  Continue home Protonix 40 mg daily   DVT prophylaxis: heparin injection 5,000 Units Start: 11/13/20 2200   Code Status: Prior  Family Communication:  daughter updated at bedside today  Status is: Inpatient  Dispo: The patient is from: Home              Anticipated d/c is to: SNF  Patient currently is medically stable to d/c.   Difficult to place patient No   Estimated body mass index is 25.4 kg/m as calculated from the following:   Height as of this encounter: '5\' 2"'$  (1.575 m).   Weight as of this encounter: 63 kg.      Nutritional status:  Nutrition Problem: Inadequate oral intake Etiology: decreased appetite   Signs/Symptoms: per patient/family report   Interventions: Refer to RD note for recommendations    Consultants:  None  Procedures:   None  Antimicrobials:  Anti-infectives (From admission, onward)    Start     Dose/Rate Route Frequency Ordered Stop   11/15/20 1000  levofloxacin (LEVAQUIN) IVPB 500 mg        500 mg 100 mL/hr over 60 Minutes Intravenous Every 48 hours 11/13/20 1551 11/19/20 0959   11/14/20 1000  levofloxacin (LEVAQUIN) IVPB 750 mg  Status:  Discontinued        750 mg 100 mL/hr over 90 Minutes Intravenous Every 24 hours 11/13/20 1508 11/13/20 1551   11/13/20 1400  levofloxacin (LEVAQUIN) IVPB 750 mg        750 mg 100 mL/hr over 90 Minutes Intravenous  Once 11/13/20 1350 11/13/20 1636          Subjective: Pt reported doing ok.  No new complaints.  Now agreeable to SNF rehab.   Objective: Vitals:   11/16/20 0413 11/16/20 0738 11/16/20 1326 11/16/20 1518  BP: (!) 155/74 (!) 153/75  (!) 155/87  Pulse: 79 80 81 84  Resp: 18  16   Temp: 97.9 F (36.6 C) 97.9 F (36.6 C)  97.7 F (36.5 C)  TempSrc: Oral Oral  Oral  SpO2: 94% 95% 94% 92%  Weight:      Height:        Intake/Output Summary (Last 24 hours) at 11/16/2020 1823 Last data filed at 11/16/2020 1300 Gross per 24 hour  Intake 420 ml  Output 900 ml  Net -480 ml   Filed Weights   11/13/20 1249  Weight: 63 kg    Examination:  Constitutional: NAD, AAOx3 HEENT: conjunctivae and lids normal, EOMI CV: No cyanosis.   RESP: normal respiratory effort, no wheezing, on RA Extremities: No effusions, edema in BLE SKIN: warm, dry Neuro: II - XII grossly intact.   Psych: Normal mood and affect.  Appropriate judgement and reason    Data Reviewed: I have personally reviewed following labs and imaging studies  CBC: Recent Labs  Lab 11/13/20 1301 11/14/20 0708 11/15/20 0413 11/16/20 0506  WBC 17.5* 11.2* 16.6* 16.5*  NEUTROABS 12.5* 9.9* 14.2*  --   HGB 12.5 11.7* 11.9* 11.9*  HCT 40.8 37.9 38.4 38.0  MCV 85.0 85.4 83.8 83.3  PLT 263 272 296 [id number]   Basic Metabolic Panel: Recent Labs  Lab 11/13/20 1301 11/14/20 0708  11/15/20 0413 11/16/20 0506  NA 134* 135 138 138  K 4.1 4.1 4.7 3.9  CL 101 101 109 108  CO2 20* 21* 23 24  GLUCOSE 115* 143* 155* 107*  BUN 61* 56* 65* 56*  CREATININE 2.18* 1.85* 1.80* 1.60*  CALCIUM 8.3* 8.6* 8.6* 8.6*  MG  --   --   --  2.1   GFR: Estimated Creatinine Clearance: 24.9 mL/min (A) (by C-G formula based on SCr of 1.6 mg/dL (H)). Liver Function Tests: Recent Labs  Lab 11/13/20 1301  AST 57*  ALT 27  ALKPHOS 51  BILITOT 0.9  PROT 7.0  ALBUMIN 2.7*   No results  for input(s): LIPASE, AMYLASE in the last 168 hours. No results for input(s): AMMONIA in the last 168 hours. Coagulation Profile: Recent Labs  Lab 11/13/20 1301  INR 1.1   Cardiac Enzymes: No results for input(s): CKTOTAL, CKMB, CKMBINDEX, TROPONINI in the last 168 hours. BNP (last 3 results) No results for input(s): PROBNP in the last 8760 hours. HbA1C: No results for input(s): HGBA1C in the last 72 hours. CBG: Recent Labs  Lab 11/15/20 1138 11/15/20 1720 11/16/20 0737 11/16/20 1116 11/16/20 1651  GLUCAP 180* 112* 84 127* 175*   Lipid Profile: No results for input(s): CHOL, HDL, LDLCALC, TRIG, CHOLHDL, LDLDIRECT in the last 72 hours. Thyroid Function Tests: No results for input(s): TSH, T4TOTAL, FREET4, T3FREE, THYROIDAB in the last 72 hours. Anemia Panel: No results for input(s): VITAMINB12, FOLATE, FERRITIN, TIBC, IRON, RETICCTPCT in the last 72 hours. Sepsis Labs: Recent Labs  Lab 11/13/20 1301 11/13/20 1700  PROCALCITON  --  1.04  LATICACIDVEN 1.2  --     Recent Results (from the past 240 hour(s))  Culture, blood (Routine x 2)     Status: None (Preliminary result)   Collection Time: 11/13/20  1:01 PM   Specimen: BLOOD LEFT HAND  Result Value Ref Range Status   Specimen Description BLOOD LEFT HAND  Final   Special Requests   Final    BOTTLES DRAWN AEROBIC AND ANAEROBIC Blood Culture results may not be optimal due to an inadequate volume of blood received in culture  bottles   Culture   Final    NO GROWTH 3 DAYS Performed at [facility], [address] [place], [place] [address]    Report Status PENDING  Incomplete  Resp Panel by RT-PCR (Flu A&B, Covid) Nasopharyngeal Swab     Status: None   Collection Time: 11/13/20  2:20 PM   Specimen: Nasopharyngeal Swab; Nasopharyngeal(NP) swabs in vial transport medium  Result Value Ref Range Status   SARS Coronavirus 2 by RT PCR NEGATIVE NEGATIVE Final    Comment: (NOTE) SARS-CoV-2 target nucleic acids are NOT DETECTED.  The SARS-CoV-2 RNA is generally detectable in upper respiratory specimens during the acute phase of infection. The lowest concentration of SARS-CoV-2 viral copies this assay can detect is 138 copies/mL. A negative result does not preclude SARS-Cov-2 infection and should not be used as the sole basis for treatment or other patient management decisions. A negative result may occur with  improper specimen collection/handling, submission of specimen other than nasopharyngeal swab, presence of viral mutation(s) within the areas targeted by this assay, and inadequate number of viral copies(<138 copies/mL). A negative result must be combined with clinical observations, patient history, and epidemiological information. The expected result is Negative.  Fact Sheet for Patients:  EntrepreneurPulse.com.au  Fact Sheet for Healthcare Providers:  IncredibleEmployment.be  This test is no t yet approved or cleared by the [place] FDA and  has been authorized for detection and/or diagnosis of SARS-CoV-2 by FDA under an Emergency Use Authorization (EUA). This EUA will remain  in effect (meaning this test can be used) for the duration of the COVID-19 declaration under Section 564(b)(1) of the Act, 21 U.S.C.section 360bbb-3(b)(1), unless the authorization is terminated  or revoked sooner.       Influenza A by PCR NEGATIVE NEGATIVE Final   Influenza  B by PCR NEGATIVE NEGATIVE Final    Comment: (NOTE) The Xpert Xpress SARS-CoV-2/FLU/RSV plus assay is intended as an aid in the diagnosis of influenza from Nasopharyngeal swab specimens and should not  be used as a sole basis for treatment. Nasal washings and aspirates are unacceptable for Xpert Xpress SARS-CoV-2/FLU/RSV testing.  Fact Sheet for Patients: EntrepreneurPulse.com.au  Fact Sheet for Healthcare Providers: IncredibleEmployment.be  This test is not yet approved or cleared by the [place] FDA and has been authorized for detection and/or diagnosis of SARS-CoV-2 by FDA under an Emergency Use Authorization (EUA). This EUA will remain in effect (meaning this test can be used) for the duration of the COVID-19 declaration under Section 564(b)(1) of the Act, 21 U.S.C. section 360bbb-3(b)(1), unless the authorization is terminated or revoked.  Performed at [facility], [address] [place], [place] [address]   Culture, blood (Routine X 2) w Reflex to ID Panel     Status: None (Preliminary result)   Collection Time: 11/14/20  7:08 AM   Specimen: BLOOD  Result Value Ref Range Status   Specimen Description BLOOD LEFT ANTECUBITAL  Final   Special Requests   Final    BOTTLES DRAWN AEROBIC AND ANAEROBIC Blood Culture adequate volume   Culture   Final    NO GROWTH 2 DAYS Performed at [facility], [address], [place], [place] [address]    Report Status PENDING  Incomplete  Resp Panel by RT-PCR (Flu A&B, Covid) Nasopharyngeal Swab     Status: None   Collection Time: 11/16/20  3:06 PM   Specimen: Nasopharyngeal Swab; Nasopharyngeal(NP) swabs in vial transport medium  Result Value Ref Range Status   SARS Coronavirus 2 by RT PCR NEGATIVE NEGATIVE Final    Comment: (NOTE) SARS-CoV-2 target nucleic acids are NOT DETECTED.  The SARS-CoV-2 RNA is generally detectable in upper respiratory specimens during the acute  phase of infection. The lowest concentration of SARS-CoV-2 viral copies this assay can detect is 138 copies/mL. A negative result does not preclude SARS-Cov-2 infection and should not be used as the sole basis for treatment or other patient management decisions. A negative result may occur with  improper specimen collection/handling, submission of specimen other than nasopharyngeal swab, presence of viral mutation(s) within the areas targeted by this assay, and inadequate number of viral copies(<138 copies/mL). A negative result must be combined with clinical observations, patient history, and epidemiological information. The expected result is Negative.  Fact Sheet for Patients:  EntrepreneurPulse.com.au  Fact Sheet for Healthcare Providers:  IncredibleEmployment.be  This test is no t yet approved or cleared by the [place] FDA and  has been authorized for detection and/or diagnosis of SARS-CoV-2 by FDA under an Emergency Use Authorization (EUA). This EUA will remain  in effect (meaning this test can be used) for the duration of the COVID-19 declaration under Section 564(b)(1) of the Act, 21 U.S.C.section 360bbb-3(b)(1), unless the authorization is terminated  or revoked sooner.       Influenza A by PCR NEGATIVE NEGATIVE Final   Influenza B by PCR NEGATIVE NEGATIVE Final    Comment: (NOTE) The Xpert Xpress SARS-CoV-2/FLU/RSV plus assay is intended as an aid in the diagnosis of influenza from Nasopharyngeal swab specimens and should not be used as a sole basis for treatment. Nasal washings and aspirates are unacceptable for Xpert Xpress SARS-CoV-2/FLU/RSV testing.  Fact Sheet for Patients: EntrepreneurPulse.com.au  Fact Sheet for Healthcare Providers: IncredibleEmployment.be  This test is not yet approved or cleared by the [place] FDA and has been authorized for detection and/or diagnosis of  SARS-CoV-2 by FDA under an Emergency Use Authorization (EUA). This EUA will remain in effect (meaning this test can be used)  for the duration of the COVID-19 declaration under Section 564(b)(1) of the Act, 21 U.S.C. section 360bbb-3(b)(1), unless the authorization is terminated or revoked.  Performed at [facility], [address], [place], [place] [address]       Radiology Studies: No results found.  Scheduled Meds:  carvedilol  6.25 mg Oral BID   Chlorhexidine Gluconate Cloth  6 each Topical Daily   cholecalciferol  1,000 Units Oral Daily   clopidogrel  75 mg Oral Q breakfast   DULoxetine  90 mg Oral Daily   feeding supplement  237 mL Oral Q24H   guaiFENesin  600 mg Oral BID   heparin injection (subcutaneous)  5,000 Units Subcutaneous Q8H   ipratropium-albuterol  3 mL Nebulization TID   letrozole  2.5 mg Oral Daily   loratadine  5 mg Oral Daily   magnesium oxide  200 mg Oral Daily   montelukast  10 mg Oral QHS   pantoprazole  40 mg Oral Daily   predniSONE  40 mg Oral Q breakfast   rosuvastatin  20 mg Oral QPM   umeclidinium-vilanterol  1 puff Inhalation Daily   Continuous Infusions:  levofloxacin (LEVAQUIN) IV 500 mg (11/15/20 1142)     LOS: 3 days    Enzo Bi, MD [facility]  11/16/2020, 6:23 PM

## 2020-11-16 NOTE — Progress Notes (Signed)
Physical Therapy Treatment Patient Details Name: Krista Cisneros MRN: [id number] DOB: 09-05-1940 Today's Date: 11/16/2020    History of Present Illness Krista Cisneros is a 80 y.o. female with a past medical history of COPD/severe emphysema not on home oxygen supplementation, coronary artery disease status post PCI, CHF with reduced ejection fraction, hypertension, hyperlipidemia, depression/anxiety, history of recurrent pneumonia, former tobacco user, history of right breast cancer, GERD.  The patient presented to the emergency department 11/13/20 due to generalized weakness, following fall. Patient admitted for acute hypoxic respiratory failure secondary to COPD/severe emphysema exacerbation and community-aquired pneumonia with sepsis.    PT Comments    Pt is making limited progress towards goals with stair training performed this date. RW used with additional staff for chair follow as she fatigues quickly. 2 LOB during stair training. Pt states she feels unable to safely dc home, now agreeable to SNF. Secure chat sent to care team. Will continue to progress as able.  Follow Up Recommendations  SNF     Equipment Recommendations  None recommended by PT    Recommendations for Other Services       Precautions / Restrictions Precautions Precautions: Fall Precaution Comments: left port access Restrictions Weight Bearing Restrictions: No    Mobility  Bed Mobility Overal bed mobility: Needs Assistance Bed Mobility: Supine to Sit     Supine to sit: Modified independent (Device/Increase time);HOB elevated     General bed mobility comments: safe technique. ONce upright static sitting noted    Transfers Overall transfer level: Needs assistance Equipment used: Rolling walker (2 wheeled) Transfers: Sit to/from Stand Sit to Stand: Min guard         General transfer comment: needs cues for safe hand placement. RW used  Ambulation/Gait Ambulation/Gait assistance: Min  Web designer (Feet): 100 Feet Assistive device: Rolling walker (2 wheeled) Gait Pattern/deviations: Step-through pattern     General Gait Details: slight veering in hallway. Occasionally runs into obstacles in hallway. Needs hands on assist for safety   Stairs Stairs: Yes Stairs assistance: Mod assist Stair Management: One rail Left Number of Stairs: 7 General stair comments: step to gait pattern. 2 instances of LOB with pt needing mod assist to prevent fall. Very fearful of falling. Increased SOB symptoms noted with stair training   Wheelchair Mobility    Modified Rankin (Stroke Patients Only)       Balance Overall balance assessment: Needs assistance Sitting-balance support: No upper extremity supported;Feet supported Sitting balance-Leahy Scale: Good Sitting balance - Comments: Steady static/dynamic sitting EOB. No LOB apreciated during weight shift for functional activities.   Standing balance support: Bilateral upper extremity supported Standing balance-Leahy Scale: Fair                              Cognition Arousal/Alertness: Awake/alert Behavior During Therapy: WFL for tasks assessed/performed Overall Cognitive Status: Impaired/Different from baseline                                 General Comments: confused reporting she was in [place]      Exercises Other Exercises Other Exercises: OT facilitates education on energy conservation strategies includin 4 p's, pursed lip breathing, falls prevention strategies and routines modifications to support safety and functional independence. Education provided during functional activities including LB dressing, peri-care, and functional mobility. See ADL section for additional details.    General Comments  General comments (skin integrity, edema, etc.): VSS t/o session. HR/SO2 monitored and remain WFL during functional tasks with pt on RA (SO2 94-99%, HR 90-100's)      Pertinent  Vitals/Pain Pain Assessment: No/denies pain    Home Living                      Prior Function            PT Goals (current goals can now be found in the care plan section) Acute Rehab PT Goals Patient Stated Goal: to go home PT Goal Formulation: With patient Time For Goal Achievement: 11/28/20 Potential to Achieve Goals: Good Progress towards PT goals: Progressing toward goals    Frequency    Min 2X/week      PT Plan Current plan remains appropriate    Co-evaluation              AM-PAC PT "6 Clicks" Mobility   Outcome Measure  Help needed turning from your back to your side while in a flat bed without using bedrails?: A Little Help needed moving from lying on your back to sitting on the side of a flat bed without using bedrails?: A Little Help needed moving to and from a bed to a chair (including a wheelchair)?: A Little Help needed standing up from a chair using your arms (e.g., wheelchair or bedside chair)?: A Little Help needed to walk in hospital room?: A Little Help needed climbing 3-5 steps with a railing? : A Lot 6 Click Score: 17    End of Session Equipment Utilized During Treatment: Gait belt Activity Tolerance: Patient tolerated treatment well Patient left: in bed;with bed alarm set Nurse Communication: Mobility status PT Visit Diagnosis: Unsteadiness on feet (R26.81);History of falling (Z91.81);Repeated falls (R29.6);Difficulty in walking, not elsewhere classified (R26.2)     Time: [id number] PT Time Calculation (min) (ACUTE ONLY): 23 min  Charges:  $Gait Training: 23-37 mins                     Greggory Stallion, [place], DPT [phone]    Malek Skog 11/16/2020, 2:07 PM

## 2020-11-16 NOTE — TOC Progression Note (Signed)
Transition of Care [facility]) - Progression Note    Patient Details  Name: Krista Cisneros MRN: [id number] Date of Birth: [birth year]  Transition of [facility]) CM/SW Contact  Beverly Sessions, RN Phone Number: 11/16/2020, 12:46 PM  Clinical Narrative:     Patient unable to navigate stairs she is now agreeable to SNF work up [employer] obtained [place] sent for signature Bed search initiated   VM left for daughter        Expected Discharge Plan and Services                                                 Social Determinants of Health (SDOH) Interventions    Readmission Risk Interventions Readmission Risk Prevention Plan 11/15/2020 10/09/2019  Transportation Screening Complete Complete  Medication Review Press photographer) Complete Complete  HRI or Home Care Consult Complete -  SW Recovery Care/Counseling Consult Complete -  Palliative Care Screening Complete Not [place] Patient Refused Not Applicable  Some recent data might be hidden

## 2020-11-16 NOTE — NC FL2 (Signed)
[place] LEVEL OF CARE SCREENING TOOL     IDENTIFICATION  Patient Name: Krista Cisneros Birthdate: [birth year] Sex: female Admission Date (Current Location): 11/13/2020  [facility] and [place] Number:  Engineering geologist and Address:         Provider Number: [id number]  Attending Physician Name and Address:  Enzo Bi, MD  Relative Name and Phone Number:       Current Level of Care: Hospital Recommended Level of Care: [place] Prior Approval Number:    Date Approved/Denied:   PASRR Number: [id number]  Discharge Plan: SNF    Current Diagnoses: Patient Active Problem List   Diagnosis Date Noted   Acute respiratory failure with hypoxia ([place]) 11/14/2020   COPD with acute exacerbation ([place]) 11/13/2020   Community acquired pneumonia 06/17/2020   Generalized weakness 06/17/2020   History of COVID-19, December 2021 06/17/2020   History of breast cancer 06/17/2020   Chronic diastolic CHF (congestive heart failure) ([place]) 06/17/2020   Stage 3a chronic kidney disease ([place]) 06/17/2020   Sepsis due to Escherichia coli (E. coli) ([place]) 10/10/2019   Acute lower UTI 10/10/2019   SIRS (systemic inflammatory response syndrome) ([place]) 10/07/2019   Constipation 10/07/2019   Urinary retention 10/07/2019   Iron deficiency anemia 05/30/2019   Ischemic cardiomyopathy 06/03/2018   Coronary artery disease involving native coronary artery of native heart with angina pectoris ([address]) [id number]   Chronic systolic heart failure (HCC)    Coronary artery disease involving native coronary artery of native heart without angina pectoris 04/22/2018   Non-STEMI (non-ST elevated myocardial infarction) ([place]) 04/22/2018   NSTEMI (non-ST elevated myocardial infarction) ([place]) 04/09/2018   Rectal bleeding 03/22/2018   Malignant neoplasm of upper-outer quadrant of right breast in female, estrogen receptor negative ([place]) 06/19/2017   Incarcerated hernia 03/26/2017    Incarcerated inguinal hernia    Acute kidney injury ([place]) 02/07/2017   Hypomagnesemia 01/30/2017   Goals of care, counseling/discussion 10/20/2016   Malignant neoplasm of right female breast ([place]) 10/16/2016   Hyperlipidemia, mixed 10/08/2016   Chronic venous insufficiency 09/14/2016   GERD (gastroesophageal reflux disease) 09/14/2016   Pain in limb 05/29/2016   Essential hypertension 05/29/2016   COPD (chronic obstructive pulmonary disease) ([place]) 05/29/2016   Hardening of the aorta (main artery of the heart) ([place]) 01/07/2016   Hyperglycemia, unspecified 01/07/2016   Neuropathy 11/15/2015   Severe recurrent major depression without psychotic features ([place]) 09/10/2015   Allergic rhinitis 04/10/2014   Depression 04/10/2014   Obesity, unspecified 04/10/2014    Orientation RESPIRATION BLADDER Height & Weight     Self, Situation, Place, Time  Normal Incontinent, External catheter Weight: 63 kg Height:  '5\' 2"'$  (157.5 cm)  BEHAVIORAL SYMPTOMS/MOOD NEUROLOGICAL BOWEL NUTRITION STATUS      Continent Diet (Regular)  AMBULATORY STATUS COMMUNICATION OF NEEDS Skin   Extensive Assist Verbally Normal                       Personal Care Assistance Level of Assistance              Functional Limitations Info             SPECIAL CARE FACTORS FREQUENCY  PT (By licensed PT), OT (By licensed OT)                    Contractures Contractures Info: Not present    Additional Factors Info  Code Status, Allergies Code Status Info: Full Allergies Info: Nsaids,  penicillins, tamuslosin, atorvastatin, gabapentin, ezetimibe, aspirin           Current Medications (11/16/2020):  This is the current hospital active medication list Current Facility-Administered Medications  Medication Dose Route Frequency Provider Last Rate Last Admin   acetaminophen (TYLENOL) tablet 650 mg  650 mg Oral Q6H PRN Athena Masse, MD   650 mg at 11/16/20 [id number]   benzonatate (TESSALON) capsule 200 mg   200 mg Oral BID PRN Athena Masse, MD   200 mg at 11/15/20 0154   carvedilol (COREG) tablet 6.25 mg  6.25 mg Oral BID Imagene Sheller S, DO   6.25 mg at 11/16/20 [id number]   Chlorhexidine Gluconate Cloth 2 % PADS 6 each  6 each Topical Daily Leslee Home, DO   6 each at 11/16/20 [id number]   cholecalciferol (VITAMIN D3) tablet 1,000 Units  1,000 Units Oral Daily Imagene Sheller S, DO   1,000 Units at 11/16/20 [id number]   clopidogrel (PLAVIX) tablet 75 mg  75 mg Oral Q breakfast Imagene Sheller S, DO   75 mg at 11/16/20 0811   DULoxetine (CYMBALTA) DR capsule 90 mg  90 mg Oral Daily Leslee Home, DO   90 mg at 11/16/20 0813   feeding supplement (ENSURE ENLIVE / ENSURE PLUS) liquid 237 mL  237 mL Oral Q24H Enzo Bi, MD   237 mL at 11/15/20 1722   fluticasone (FLONASE) 50 MCG/ACT nasal spray 2 spray  2 spray Each Nare BID PRN Leslee Home, DO       guaiFENesin (MUCINEX) 12 hr tablet 600 mg  600 mg Oral BID Imagene Sheller S, DO   600 mg at 11/16/20 [id number]   guaiFENesin-dextromethorphan (ROBITUSSIN DM) 100-10 MG/5ML syrup 5 mL  5 mL Oral Q4H PRN Enzo Bi, MD   5 mL at 11/15/20 2314   heparin injection 5,000 Units  5,000 Units Subcutaneous Q8H Anwar, Bonnee Quin S, DO   5,000 Units at 11/16/20 0504   ipratropium-albuterol (DUONEB) 0.5-2.5 (3) MG/3ML nebulizer solution 3 mL  3 mL Nebulization TID Imagene Sheller S, DO   3 mL at 11/15/20 2051   ipratropium-albuterol (DUONEB) 0.5-2.5 (3) MG/3ML nebulizer solution 3 mL  3 mL Nebulization Q6H PRN Imagene Sheller S, DO       letrozole [facility]) tablet 2.5 mg  2.5 mg Oral Daily Anwar, Bonnee Quin S, DO   2.5 mg at 11/16/20 0811   levofloxacin (LEVAQUIN) IVPB 500 mg  500 mg Intravenous Q48H Vira Blanco, RPH 100 mL/hr at 11/15/20 1142 500 mg at 11/15/20 1142   loratadine (CLARITIN) tablet 5 mg  5 mg Oral Daily Imagene Sheller S, DO   5 mg at 11/16/20 [id number]   magnesium oxide (MAG-OX) tablet 200 mg  200 mg Oral Daily Imagene Sheller S, DO   200 mg at 11/16/20 [id number]   melatonin tablet 10 mg  10  mg Oral QHS PRN Imagene Sheller S, DO       montelukast (SINGULAIR) tablet 10 mg  10 mg Oral QHS Anwar, Bonnee Quin S, DO   10 mg at 11/15/20 2118   pantoprazole (PROTONIX) EC tablet 40 mg  40 mg Oral Daily Imagene Sheller S, DO   40 mg at 11/16/20 [id number]   predniSONE (DELTASONE) tablet 40 mg  40 mg Oral Q breakfast Imagene Sheller S, DO   40 mg at 11/16/20 0816   rosuvastatin (CRESTOR) tablet 20 mg  20 mg Oral QPM Imagene Sheller S, DO   20 mg at 11/15/20 1722  umeclidinium-vilanterol (ANORO ELLIPTA) 62.5-25 MCG/INH 1 puff  1 puff Inhalation Daily Imagene Sheller S, DO   1 puff at 11/16/20 0810   Facility-Administered Medications Ordered in Other Encounters  Medication Dose Route Frequency Provider Last Rate Last Admin   heparin lock flush 100 unit/mL  500 Units Intravenous Once Lloyd Huger, MD       heparin lock flush 100 unit/mL  500 Units Intravenous Once Lloyd Huger, MD       ondansetron [facility]) 8 mg in sodium chloride 0.9 % 50 mL IVPB   Intravenous Once Lloyd Huger, MD         Discharge Medications: Please see discharge summary for a list of discharge medications.  Relevant Imaging Results:  Relevant Lab Results:   Additional Information ss [id number]  Beverly Sessions, RN

## 2020-11-17 DIAGNOSIS — J189 Pneumonia, unspecified organism: Secondary | ICD-10-CM | POA: Diagnosis not present

## 2020-11-17 LAB — CBC
HCT: 39.6 % (ref 36.0–46.0)
Hemoglobin: 12.4 g/dL (ref 12.0–15.0)
MCH: 26.1 pg (ref 26.0–34.0)
MCHC: 31.3 g/dL (ref 30.0–36.0)
MCV: 83.4 fL (ref 80.0–100.0)
Platelets: 336 10*3/uL (ref 150–400)
RBC: 4.75 MIL/uL (ref 3.87–5.11)
RDW: 17.9 % — ABNORMAL HIGH (ref 11.5–15.5)
WBC: 14.8 10*3/uL — ABNORMAL HIGH (ref 4.0–10.5)
nRBC: 0 % (ref 0.0–0.2)

## 2020-11-17 LAB — BASIC METABOLIC PANEL
Anion gap: 6 (ref 5–15)
BUN: 46 mg/dL — ABNORMAL HIGH (ref 8–23)
CO2: 25 mmol/L (ref 22–32)
Calcium: 8.6 mg/dL — ABNORMAL LOW (ref 8.9–10.3)
Chloride: 110 mmol/L (ref 98–111)
Creatinine, Ser: 1.37 mg/dL — ABNORMAL HIGH (ref 0.44–1.00)
GFR, Estimated: 39 mL/min — ABNORMAL LOW (ref >60)
Glucose, Bld: 96 mg/dL (ref 70–99)
Potassium: 3.8 mmol/L (ref 3.5–5.1)
Sodium: 141 mmol/L (ref 135–145)

## 2020-11-17 LAB — MAGNESIUM: Magnesium: 2 mg/dL (ref 1.7–2.4)

## 2020-11-17 MED ORDER — SPIRONOLACTONE 25 MG PO TABS
25.0000 mg | ORAL_TABLET | Freq: Every day | ORAL | Status: DC
  Administered 2020-11-17: 25 mg via ORAL
  Filled 2020-11-17: qty 1

## 2020-11-17 MED ORDER — HEPARIN SOD (PORK) LOCK FLUSH 100 UNIT/ML IV SOLN
500.0000 [IU] | Freq: Once | INTRAVENOUS | Status: AC
  Administered 2020-11-17: 500 [IU] via INTRAVENOUS
  Filled 2020-11-17: qty 5

## 2020-11-17 MED ORDER — ROSUVASTATIN CALCIUM 20 MG PO TABS
20.0000 mg | ORAL_TABLET | Freq: Every evening | ORAL | Status: DC
Start: 2020-11-17 — End: 2020-12-03

## 2020-11-17 MED ORDER — ENSURE ENLIVE PO LIQD: 237.0000 mL | ORAL | Status: AC

## 2020-11-17 MED ORDER — PREDNISONE 20 MG PO TABS: 40.0000 mg | ORAL_TABLET | Freq: Every day | ORAL | 0 refills | Status: AC

## 2020-11-17 MED ORDER — ENALAPRIL MALEATE 5 MG PO TABS
5.0000 mg | ORAL_TABLET | Freq: Every day | ORAL | Status: DC
  Administered 2020-11-17: 5 mg via ORAL
  Filled 2020-11-17: qty 1

## 2020-11-17 NOTE — Progress Notes (Signed)
Mobility Specialist - Progress Note   11/17/20 1200  Mobility  Activity Ambulated in hall  Level of Assistance Other (Comment) (HHA)  Assistive Device None  Distance Ambulated (ft) 60 ft  Mobility Ambulated with assistance in hallway  Mobility Response Tolerated well  Mobility performed by Mobility specialist  $Mobility charge 1 Mobility    Pre-mobility: 74 HR, 97% SpO2 During mobility: 96 HR, 90% SpO2 Post-mobility: 88 HR, 98% SpO2   Pt lying in bed upon arrival, utilizing RA. Pt sat EOB and stood without physical assist. Ambulated short hallway, per pt request, with HHA. No LOB. O2 maintained > 88%. Denied SOB. Pt returned EOB with lunch tray arriving and placed in reach.    Kathee Delton Mobility Specialist 11/17/20, 12:54 PM

## 2020-11-17 NOTE — TOC Progression Note (Signed)
Transition of Care [facility]) - Progression Note    Patient Details  Name: Krista Cisneros MRN: [id number] Date of Birth: November 24, 1940  [facility] CM/SW Contact  Beverly Sessions, RN Phone Number: 11/17/2020, 8:55 AM  Clinical Narrative:     Insurance auth still pending in [place] portal        Expected Discharge Plan and Services                                                 Social Determinants of Health (SDOH) Interventions    Readmission Risk Interventions Readmission Risk Prevention Plan 11/15/2020 10/09/2019  Transportation Screening Complete Complete  Medication Review Press photographer) Complete Complete  HRI or Home Care Consult Complete -  SW Recovery Care/Counseling Consult Complete -  Palliative Care Screening Complete Not [place] Patient Refused Not Applicable  Some recent data might be hidden

## 2020-11-17 NOTE — Progress Notes (Signed)
Krista Cisneros to be D/C'd Skilled nursing facility per MD order.  Discussed prescriptions and follow up appointments with the patient. Prescriptions given to patient, medication list explained in detail. Pt verbalized understanding.  Allergies as of 11/17/2020       Reactions   Nsaids    Bleeding risk   Penicillins Anaphylaxis, Swelling, Rash   Has patient had a PCN reaction causing immediate rash, facial/tongue/throat swelling, SOB or lightheadedness with hypotension: Yes Has patient had a PCN reaction causing severe rash involving mucus membranes or skin necrosis: No Has patient had a PCN reaction that required hospitalization: No  Has patient had a PCN reaction occurring within the last 10 years: No If all of the above answers are "NO", then may proceed with Cephalosporin use.   Tamsulosin Nausea Only   Atorvastatin Other (See Comments)   Muscle Pain   Gabapentin Swelling   Ezetimibe Other (See Comments)   Muscle pain   Aspirin    GI bleeding risk        Medication List     TAKE these medications    albuterol 108 (90 Base) MCG/ACT inhaler Commonly known as: VENTOLIN HFA Inhale 2 puffs into the lungs every 6 (six) hours as needed for wheezing or shortness of breath.   baclofen 10 MG tablet Commonly known as: LIORESAL Take 10 mg by mouth at bedtime.   carvedilol 3.125 MG tablet Commonly known as: COREG Take 2 tablets (6.25 mg total) by mouth 2 (two) times daily.   cholecalciferol 25 MCG (1000 UNIT) tablet Commonly known as: VITAMIN D3 Take 1,000 Units by mouth daily.   clopidogrel 75 MG tablet Commonly known as: PLAVIX TAKE 1 TABLET (75 MG TOTAL) BY MOUTH DAILY WITH BREAKFAST.   Coenzyme Q10 300 MG Caps Take 300 mg by mouth daily.   DULoxetine 30 MG capsule Commonly known as: CYMBALTA Take 30 mg by mouth daily. (Take with '60mg'$  capsule to equal '90mg'$ )   DULoxetine 60 MG capsule Commonly known as: CYMBALTA Take 60 mg by mouth daily. (Take with '30mg'$   capsule to equal '90mg'$ )   enalapril 5 MG tablet Commonly known as: Vasotec Take 1 tablet (5 mg total) by mouth daily.   feeding supplement Liqd Take 237 mLs by mouth daily.   fexofenadine 180 MG tablet Commonly known as: ALLEGRA Take 180 mg by mouth daily.   fluticasone 50 MCG/ACT nasal spray Commonly known as: FLONASE Place 2 sprays into the nose 2 (two) times daily as needed for allergies or rhinitis.   fluticasone furoate-vilanterol 200-25 MCG/INH Aepb Commonly known as: BREO ELLIPTA Inhale 1 puff into the lungs daily.   furosemide 20 MG tablet Commonly known as: Lasix Take 1 tablet (20 mg total) by mouth daily as needed for edema.   guaiFENesin-dextromethorphan 100-10 MG/5ML syrup Commonly known as: ROBITUSSIN DM Take 5 mLs by mouth every 4 (four) hours as needed for cough.   letrozole 2.5 MG tablet Commonly known as: FEMARA TAKE 1 TABLET BY MOUTH EVERY DAY   lidocaine-prilocaine cream Commonly known as: EMLA Apply 1 application as needed topically (for port access).   Magnesium Oxide 250 MG Tabs Take 250 mg by mouth daily.   Melatonin 10 MG Tabs Take 10 mg by mouth at bedtime as needed (sleep).   montelukast 10 MG tablet Commonly known as: SINGULAIR Take 10 mg by mouth at bedtime.   pantoprazole 40 MG tablet Commonly known as: PROTONIX TAKE 1 TABLET BY MOUTH EVERY DAY   predniSONE 20 MG tablet  Commonly known as: DELTASONE Take 2 tablets (40 mg total) by mouth daily with breakfast for 1 day. Start taking on: November 18, 2020   rosuvastatin 20 MG tablet Commonly known as: CRESTOR Take 1 tablet (20 mg total) by mouth every evening. What changed: See the new instructions.   spironolactone 25 MG tablet Commonly known as: ALDACTONE Take 0.5 tablets (12.5 mg total) by mouth daily. What changed: how much to take   tiotropium 18 MCG inhalation capsule Commonly known as: SPIRIVA Place 18 mcg into inhaler and inhale daily.        Vitals:   11/17/20  0744 11/17/20 1335  BP: (!) 155/71 (!) 144/88  Pulse: 79   Resp: 20   Temp: 98.5 F (36.9 C)   SpO2: 94%     Skin clean, dry and intact without evidence of skin break down, no evidence of skin tears noted. IV catheter discontinued intact. Site without signs and symptoms of complications. Dressing and pressure applied. Pt denies pain at this time. No complaints noted.  An After Visit Summary was printed and given to the patient. Patient escorted via [place], and D/C home via private auto.  [place] C[place] De[place]

## 2020-11-17 NOTE — TOC Transition Note (Signed)
Transition of Care [facility]) - CM/SW Discharge Note   Patient Details  Name: Krista Cisneros MRN: [id number] Date of Birth: [birth year]  [facility] CM/SW Contact:  Beverly Sessions, RN Phone Number: 11/17/2020, 2:03 PM   Clinical Narrative:     [name] received for SNF Patient to discharge to SNF today  Daughter notified of discharge and to transport to facility Repeat covid negative  Bedside RN to call report DC info sent in hub  Final next level of care: Skilled Nursing Facility Barriers to Discharge: No Barriers Identified   Patient Goals and CMS Choice        Discharge Placement              Patient chooses bed at: [facility] Patient to be transferred to facility by: famil Name of family member notified: Clarene Critchley Patient and family notified of of transfer: 11/17/20  Discharge Plan and Services                                     Social Determinants of Health (SDOH) Interventions     Readmission Risk Interventions Readmission Risk Prevention Plan 11/15/2020 10/09/2019  Transportation Screening Complete Complete  Medication Review Press photographer) Complete Complete  HRI or Home Care Consult Complete -  SW Recovery Care/Counseling Consult Complete -  Palliative Care Screening Complete Not [place] Patient Refused Not Applicable  Some recent data might be hidden

## 2020-11-17 NOTE — Discharge Summary (Signed)
Physician Discharge Summary   Krista Cisneros  female DOB: [birth year]  [id number]  PCP: Sharyne Peach, MD  Admit date: 11/13/2020 Discharge date: 11/17/2020  Admitted From: home Disposition:  SNF CODE STATUS: Full code   Hospital Course:  For full details, please see H&P, progress notes, consult notes and ancillary notes.  Briefly,  Krista Cisneros is a 80 y.o. female with a past medical history of COPD/severe emphysema not on home oxygen supplementation, coronary artery disease status post PCI, CHF with reduced ejection fraction, hypertension, hyperlipidemia, depression/anxiety, history of recurrent pneumonia, former tobacco user, history of right breast cancer, GERD.  The patient presented to the emergency department due to generalized weakness, some chills.  She has been feeling short of breath for the past few days.  Also having worsening dry cough.  She was satting 86% when EMS arrived.  89% on room air in the emergency department.    She also had a fall secondary to generalized weakness and fell on her side and hit the bridge of her nose as well.  She was getting home health physical therapy and was doing well with it but recently after she was progressing well the granddaughter stated that it was discontinued.  En-route via EMS she received 125 mg of Solu-Medrol, 2 nebulizer treatments with duo nebs.     Sepsis and acute hypoxic respiratory failure secondary to COPD exacerbation and community-acquired pneumonia: --leukocytosis and tachypnea. --initially hypoxic and required 2L O2 on presentation, but quickly weaned down to RA on 7/25. --Pt finished a course of IV Levaquin. --Pt received steroid burst with IV solumedrol and tapered to prednisone 40 mg daily.  Pt is discharged to finish 1 more day of prednisone.   --Pt received scheduled DuoNebs while inpatient and is discharged back to her home daily bronchodilators (see meds).   Acute kidney injury, resolved on  chronic kidney disease stage IIIa/IIIb: Initial serum creatinine 2.18.  Baseline appears to be around 1.2-1.6.   --Cr improved with gentle IVF, and was 1.37 prior to discharge.   History of breast cancer: Continue home Femara 2.5 mg daily   Chronic CHF with reduced ejection fraction: Not in acute decompensation.   --cont home coreg --aldactone, lasix and Vasotec held during hospitalization 2/2 AKI, resumed at discharge since AKI resolved.   Essential hypertension:  Controlled.   --cont home coreg --aldactone, lasix and Vasotec held during hospitalization 2/2 AKI, resumed at discharge since AKI resolved.   Hyperlipidemia: Continue home Crestor 20 mg daily   Coronary artery disease status post PCI: Continue home Plavix 75 mg daily   Depression/anxiety: Continue home duloxetine 90 mg daily   GERD: Continue home Protonix 40 mg daily     Discharge Diagnoses:  Active Problems:   Essential hypertension   Acute kidney injury ([place])   Coronary artery disease involving native coronary artery of native heart with angina pectoris ([place])   Community acquired pneumonia   Chronic diastolic CHF (congestive heart failure) ([place])   COPD with acute exacerbation ([place])   Acute respiratory failure with hypoxia ([place])   30 Day Unplanned Readmission Risk Score    Flowsheet Row ED to Hosp-Admission (Current) from 11/13/2020 in [facility]  30 Day Unplanned Readmission Risk Score (%) 29.76 Filed at 11/17/2020 0801       This score is the patient's risk of an unplanned readmission within 30 days of being discharged (0 -100%). The score is based on dignosis, age, lab data, medications,  orders, and past utilization.   Low:  0-14.9   Medium: 15-21.9   High: 22-29.9   Extreme: 30 and above         Discharge Instructions:  Allergies as of 11/17/2020       Reactions   Nsaids    Bleeding risk   Penicillins Anaphylaxis, Swelling, Rash   Has patient had a  PCN reaction causing immediate rash, facial/tongue/throat swelling, SOB or lightheadedness with hypotension: Yes Has patient had a PCN reaction causing severe rash involving mucus membranes or skin necrosis: No Has patient had a PCN reaction that required hospitalization: No  Has patient had a PCN reaction occurring within the last 10 years: No If all of the above answers are "NO", then may proceed with Cephalosporin use.   Tamsulosin Nausea Only   Atorvastatin Other (See Comments)   Muscle Pain   Gabapentin Swelling   Ezetimibe Other (See Comments)   Muscle pain   Aspirin    GI bleeding risk        Medication List     TAKE these medications    albuterol 108 (90 Base) MCG/ACT inhaler Commonly known as: VENTOLIN HFA Inhale 2 puffs into the lungs every 6 (six) hours as needed for wheezing or shortness of breath.   baclofen 10 MG tablet Commonly known as: LIORESAL Take 10 mg by mouth at bedtime.   carvedilol 3.125 MG tablet Commonly known as: COREG Take 2 tablets (6.25 mg total) by mouth 2 (two) times daily.   cholecalciferol 25 MCG (1000 UNIT) tablet Commonly known as: VITAMIN D3 Take 1,000 Units by mouth daily.   clopidogrel 75 MG tablet Commonly known as: PLAVIX TAKE 1 TABLET (75 MG TOTAL) BY MOUTH DAILY WITH BREAKFAST.   Coenzyme Q10 300 MG Caps Take 300 mg by mouth daily.   DULoxetine 30 MG capsule Commonly known as: CYMBALTA Take 30 mg by mouth daily. (Take with '60mg'$  capsule to equal '90mg'$ )   DULoxetine 60 MG capsule Commonly known as: CYMBALTA Take 60 mg by mouth daily. (Take with '30mg'$  capsule to equal '90mg'$ )   enalapril 5 MG tablet Commonly known as: Vasotec Take 1 tablet (5 mg total) by mouth daily.   feeding supplement Liqd Take 237 mLs by mouth daily.   fexofenadine 180 MG tablet Commonly known as: ALLEGRA Take 180 mg by mouth daily.   fluticasone 50 MCG/ACT nasal spray Commonly known as: FLONASE Place 2 sprays into the nose 2 (two) times daily  as needed for allergies or rhinitis.   fluticasone furoate-vilanterol 200-25 MCG/INH Aepb Commonly known as: BREO ELLIPTA Inhale 1 puff into the lungs daily.   furosemide 20 MG tablet Commonly known as: Lasix Take 1 tablet (20 mg total) by mouth daily as needed for edema.   guaiFENesin-dextromethorphan 100-10 MG/5ML syrup Commonly known as: ROBITUSSIN DM Take 5 mLs by mouth every 4 (four) hours as needed for cough.   letrozole 2.5 MG tablet Commonly known as: FEMARA TAKE 1 TABLET BY MOUTH EVERY DAY   lidocaine-prilocaine cream Commonly known as: EMLA Apply 1 application as needed topically (for port access).   Magnesium Oxide 250 MG Tabs Take 250 mg by mouth daily.   Melatonin 10 MG Tabs Take 10 mg by mouth at bedtime as needed (sleep).   montelukast 10 MG tablet Commonly known as: SINGULAIR Take 10 mg by mouth at bedtime.   pantoprazole 40 MG tablet Commonly known as: PROTONIX TAKE 1 TABLET BY MOUTH EVERY DAY   predniSONE 20 MG tablet  Commonly known as: DELTASONE Take 2 tablets (40 mg total) by mouth daily with breakfast for 1 day. Start taking on: November 18, 2020   rosuvastatin 20 MG tablet Commonly known as: CRESTOR Take 1 tablet (20 mg total) by mouth every evening. What changed: See the new instructions.   spironolactone 25 MG tablet Commonly known as: ALDACTONE Take 0.5 tablets (12.5 mg total) by mouth daily. What changed: how much to take   tiotropium 18 MCG inhalation capsule Commonly known as: SPIRIVA Place 18 mcg into inhaler and inhale daily.         Contact information for after-discharge care     [facility] .   Service: Skilled Nursing Contact information: [place] [address] [phone]                     Allergies  Allergen Reactions   Nsaids     Bleeding risk   Penicillins Anaphylaxis, Swelling and Rash    Has patient had a PCN reaction  causing immediate rash, facial/tongue/throat swelling, SOB or lightheadedness with hypotension: Yes Has patient had a PCN reaction causing severe rash involving mucus membranes or skin necrosis: No Has patient had a PCN reaction that required hospitalization: No  Has patient had a PCN reaction occurring within the last 10 years: No If all of the above answers are "NO", then may proceed with Cephalosporin use.    Tamsulosin Nausea Only   Atorvastatin Other (See Comments)    Muscle Pain   Gabapentin Swelling   Ezetimibe Other (See Comments)    Muscle pain   Aspirin     GI bleeding risk     The results of significant diagnostics from this hospitalization (including imaging, microbiology, ancillary and laboratory) are listed below for reference.   Consultations:   Procedures/Studies: CT HEAD WO CONTRAST  Result Date: 11/13/2020 CLINICAL DATA:  Fall, shortness of breath, weakness EXAM: CT HEAD WITHOUT CONTRAST TECHNIQUE: Contiguous axial images were obtained from the base of the skull through the vertex without intravenous contrast. COMPARISON:  None. FINDINGS: Brain: No evidence of acute infarction, hemorrhage, hydrocephalus, extra-axial collection, visible mass lesion or mass effect. Symmetric prominence of the ventricles, cisterns and sulci compatible with parenchymal volume loss. Patchy areas of white matter hypoattenuation are most compatible with chronic microvascular angiopathy. Vascular: Atherosclerotic calcification of the carotid siphons and intradural vertebral arteries. No hyperdense vessel. Skull: No calvarial fracture or suspicious osseous lesion. No scalp swelling or hematoma. Sinuses/Orbits: Layering air-fluid level seen in the maxillary sinuses. More mild mural thickening in the ethmoids. Mastoid air cells are clear. Middle ear cavities are clear. Included orbital structures are unremarkable. Other: None IMPRESSION: No acute intracranial abnormality. Background of intracranial  atherosclerosis, microvascular angiopathy and parenchymal volume loss. Layering air-fluid levels in the maxillary sinuses, may correlate for clinical features of acute sinusitis. Electronically Signed   By: Lovena Le M.D.   On: 11/13/2020 19:57   DG Chest Port 1 View  Result Date: 11/13/2020 CLINICAL DATA:  Sepsis. EXAM: PORTABLE CHEST 1 VIEW COMPARISON:  08/18/2020 and chest CT 06/17/2020. FINDINGS: Again noted are severe emphysematous changes in the upper lungs. Slightly increased interstitial lung densities in lower chest bilaterally. New focal densities in the right mid lung region. Stable position of left subclavian Port-A-Cath with tip in the SVC region. Heart size is within normal limits and stable. Atherosclerotic calcifications at the aortic arch. Surgical clips in the right  axilla. IMPRESSION: 1. Severe emphysema with slightly increased interstitial densities in the lower lungs and concern for more focal disease in the right mid lung region. Differential diagnosis includes atypical infection versus mild interstitial edema. 2. Stable position of the left subclavian Port-A-Cath. Electronically Signed   By: Markus Daft M.D.   On: 11/13/2020 13:34      Labs: BNP (last 3 results) Recent Labs    11/13/20 1301  BNP [id number]   Basic Metabolic Panel: Recent Labs  Lab 11/13/20 1301 11/14/20 0708 11/15/20 0413 11/16/20 0506 11/17/20 0442  NA 134* 135 138 138 141  K 4.1 4.1 4.7 3.9 3.8  CL 101 101 109 108 110  CO2 20* 21* '23 24 25  '$ GLUCOSE 115* 143* 155* 107* 96  BUN 61* 56* 65* 56* 46*  CREATININE 2.18* 1.85* 1.80* 1.60* 1.37*  CALCIUM 8.3* 8.6* 8.6* 8.6* 8.6*  MG  --   --   --  2.1 2.0   Liver Function Tests: Recent Labs  Lab 11/13/20 1301  AST 57*  ALT 27  ALKPHOS 51  BILITOT 0.9  PROT 7.0  ALBUMIN 2.7*   No results for input(s): LIPASE, AMYLASE in the last 168 hours. No results for input(s): AMMONIA in the last 168 hours. CBC: Recent Labs  Lab 11/13/20 1301  11/14/20 0708 11/15/20 0413 11/16/20 0506 11/17/20 0442  WBC 17.5* 11.2* 16.6* 16.5* 14.8*  NEUTROABS 12.5* 9.9* 14.2*  --   --   HGB 12.5 11.7* 11.9* 11.9* 12.4  HCT 40.8 37.9 38.4 38.0 39.6  MCV 85.0 85.4 83.8 83.3 83.4  PLT 263 272 296 313 336   Cardiac Enzymes: No results for input(s): CKTOTAL, CKMB, CKMBINDEX, TROPONINI in the last 168 hours. BNP: Invalid input(s): POCBNP CBG: Recent Labs  Lab 11/15/20 1138 11/15/20 1720 11/16/20 0737 11/16/20 1116 11/16/20 1651  GLUCAP 180* 112* 84 127* 175*   D-Dimer No results for input(s): DDIMER in the last 72 hours. Hgb A1c No results for input(s): HGBA1C in the last 72 hours. Lipid Profile No results for input(s): CHOL, HDL, LDLCALC, TRIG, CHOLHDL, LDLDIRECT in the last 72 hours. Thyroid function studies No results for input(s): TSH, T4TOTAL, T3FREE, THYROIDAB in the last 72 hours.  Invalid input(s): FREET3 Anemia work up No results for input(s): VITAMINB12, FOLATE, FERRITIN, TIBC, IRON, RETICCTPCT in the last 72 hours. Urinalysis    Component Value Date/Time   COLORURINE YELLOW (A) 11/14/2020 1027   APPEARANCEUR HAZY (A) 11/14/2020 1027   LABSPEC 1.016 11/14/2020 1027   PHURINE 6.0 11/14/2020 1027   GLUCOSEU NEGATIVE 11/14/2020 1027   HGBUR SMALL (A) 11/14/2020 1027   BILIRUBINUR NEGATIVE 11/14/2020 1027   KETONESUR NEGATIVE 11/14/2020 1027   PROTEINUR 30 (A) 11/14/2020 1027   NITRITE POSITIVE (A) 11/14/2020 1027   LEUKOCYTESUR LARGE (A) 11/14/2020 1027   Sepsis Labs Invalid input(s): PROCALCITONIN,  WBC,  LACTICIDVEN Microbiology Recent Results (from the past 240 hour(s))  Culture, blood (Routine x 2)     Status: None (Preliminary result)   Collection Time: 11/13/20  1:01 PM   Specimen: BLOOD LEFT HAND  Result Value Ref Range Status   Specimen Description BLOOD LEFT HAND  Final   Special Requests   Final    BOTTLES DRAWN AEROBIC AND ANAEROBIC Blood Culture results may not be optimal due to an inadequate  volume of blood received in culture bottles   Culture   Final    NO GROWTH 4 DAYS Performed at [facility], [address] [place],  [place] [address]    Report Status PENDING  Incomplete  Resp Panel by RT-PCR (Flu A&B, Covid) Nasopharyngeal Swab     Status: None   Collection Time: 11/13/20  2:20 PM   Specimen: Nasopharyngeal Swab; Nasopharyngeal(NP) swabs in vial transport medium  Result Value Ref Range Status   SARS Coronavirus 2 by RT PCR NEGATIVE NEGATIVE Final    Comment: (NOTE) SARS-CoV-2 target nucleic acids are NOT DETECTED.  The SARS-CoV-2 RNA is generally detectable in upper respiratory specimens during the acute phase of infection. The lowest concentration of SARS-CoV-2 viral copies this assay can detect is 138 copies/mL. A negative result does not preclude SARS-Cov-2 infection and should not be used as the sole basis for treatment or other patient management decisions. A negative result may occur with  improper specimen collection/handling, submission of specimen other than nasopharyngeal swab, presence of viral mutation(s) within the areas targeted by this assay, and inadequate number of viral copies(<138 copies/mL). A negative result must be combined with clinical observations, patient history, and epidemiological information. The expected result is Negative.  Fact Sheet for Patients:  EntrepreneurPulse.com.au  Fact Sheet for Healthcare Providers:  IncredibleEmployment.be  This test is no t yet approved or cleared by the [place] FDA and  has been authorized for detection and/or diagnosis of SARS-CoV-2 by FDA under an Emergency Use Authorization (EUA). This EUA will remain  in effect (meaning this test can be used) for the duration of the COVID-19 declaration under Section 564(b)(1) of the Act, 21 U.S.C.section 360bbb-3(b)(1), unless the authorization is terminated  or revoked sooner.       Influenza A by PCR  NEGATIVE NEGATIVE Final   Influenza B by PCR NEGATIVE NEGATIVE Final    Comment: (NOTE) The Xpert Xpress SARS-CoV-2/FLU/RSV plus assay is intended as an aid in the diagnosis of influenza from Nasopharyngeal swab specimens and should not be used as a sole basis for treatment. Nasal washings and aspirates are unacceptable for Xpert Xpress SARS-CoV-2/FLU/RSV testing.  Fact Sheet for Patients: EntrepreneurPulse.com.au  Fact Sheet for Healthcare Providers: IncredibleEmployment.be  This test is not yet approved or cleared by the [place] FDA and has been authorized for detection and/or diagnosis of SARS-CoV-2 by FDA under an Emergency Use Authorization (EUA). This EUA will remain in effect (meaning this test can be used) for the duration of the COVID-19 declaration under Section 564(b)(1) of the Act, 21 U.S.C. section 360bbb-3(b)(1), unless the authorization is terminated or revoked.  Performed at [facility], [address] [place], [place] [address]   Culture, blood (Routine X 2) w Reflex to ID Panel     Status: None (Preliminary result)   Collection Time: 11/14/20  7:08 AM   Specimen: BLOOD  Result Value Ref Range Status   Specimen Description BLOOD LEFT ANTECUBITAL  Final   Special Requests   Final    BOTTLES DRAWN AEROBIC AND ANAEROBIC Blood Culture adequate volume   Culture   Final    NO GROWTH 3 DAYS Performed at [facility], [address] [place], [place] [address]    Report Status PENDING  Incomplete  Resp Panel by RT-PCR (Flu A&B, Covid) Nasopharyngeal Swab     Status: None   Collection Time: 11/16/20  3:06 PM   Specimen: Nasopharyngeal Swab; Nasopharyngeal(NP) swabs in vial transport medium  Result Value Ref Range Status   SARS Coronavirus 2 by RT PCR NEGATIVE NEGATIVE Final    Comment: (NOTE) SARS-CoV-2 target nucleic acids are NOT DETECTED.  The SARS-CoV-2  RNA is generally detectable in upper  respiratory specimens during the acute phase of infection. The lowest concentration of SARS-CoV-2 viral copies this assay can detect is 138 copies/mL. A negative result does not preclude SARS-Cov-2 infection and should not be used as the sole basis for treatment or other patient management decisions. A negative result may occur with  improper specimen collection/handling, submission of specimen other than nasopharyngeal swab, presence of viral mutation(s) within the areas targeted by this assay, and inadequate number of viral copies(<138 copies/mL). A negative result must be combined with clinical observations, patient history, and epidemiological information. The expected result is Negative.  Fact Sheet for Patients:  EntrepreneurPulse.com.au  Fact Sheet for Healthcare Providers:  IncredibleEmployment.be  This test is no t yet approved or cleared by the [place] FDA and  has been authorized for detection and/or diagnosis of SARS-CoV-2 by FDA under an Emergency Use Authorization (EUA). This EUA will remain  in effect (meaning this test can be used) for the duration of the COVID-19 declaration under Section 564(b)(1) of the Act, 21 U.S.C.section 360bbb-3(b)(1), unless the authorization is terminated  or revoked sooner.       Influenza A by PCR NEGATIVE NEGATIVE Final   Influenza B by PCR NEGATIVE NEGATIVE Final    Comment: (NOTE) The Xpert Xpress SARS-CoV-2/FLU/RSV plus assay is intended as an aid in the diagnosis of influenza from Nasopharyngeal swab specimens and should not be used as a sole basis for treatment. Nasal washings and aspirates are unacceptable for Xpert Xpress SARS-CoV-2/FLU/RSV testing.  Fact Sheet for Patients: EntrepreneurPulse.com.au  Fact Sheet for Healthcare Providers: IncredibleEmployment.be  This test is not yet approved or cleared by the [place] FDA and has been  authorized for detection and/or diagnosis of SARS-CoV-2 by FDA under an Emergency Use Authorization (EUA). This EUA will remain in effect (meaning this test can be used) for the duration of the COVID-19 declaration under Section 564(b)(1) of the Act, 21 U.S.C. section 360bbb-3(b)(1), unless the authorization is terminated or revoked.  Performed at [facility], [address] [place], [place] [address]      Total time spend on discharging this patient, including the last patient exam, discussing the hospital stay, instructions for ongoing care as it relates to all pertinent caregivers, as well as preparing the medical discharge records, prescriptions, and/or referrals as applicable, is 35 minutes.    Enzo Bi, MD  [facility] 11/17/2020, 11:57 AM

## 2020-11-18 LAB — CULTURE, BLOOD (ROUTINE X 2): Culture: NO GROWTH

## 2020-11-19 LAB — CULTURE, BLOOD (ROUTINE X 2)
Culture: NO GROWTH
Special Requests: ADEQUATE

## 2020-11-29 LAB — MISC LABCORP TEST (SEND OUT): Labcorp test code: 139650

## 2020-12-03 ENCOUNTER — Other Ambulatory Visit: Payer: Self-pay | Admitting: Cardiovascular Disease

## 2020-12-03 ENCOUNTER — Telehealth (INDEPENDENT_AMBULATORY_CARE_PROVIDER_SITE_OTHER): Payer: Self-pay

## 2020-12-03 DIAGNOSIS — I2510 Atherosclerotic heart disease of native coronary artery without angina pectoris: Secondary | ICD-10-CM

## 2020-12-03 NOTE — Telephone Encounter (Signed)
Patient is schedule port-a-cath removal on 12/06/20 with dr Lucky Cowboy [facility]. Pre-procedure instructions were gone over with patient daughter

## 2020-12-03 NOTE — Telephone Encounter (Signed)
Please advise if ok to refill Crestor 20 mg qd. Last filled by Enzo Bi, MD.

## 2020-12-06 ENCOUNTER — Other Ambulatory Visit (INDEPENDENT_AMBULATORY_CARE_PROVIDER_SITE_OTHER): Payer: Self-pay | Admitting: Nurse Practitioner

## 2020-12-06 ENCOUNTER — Encounter
Admission: RE | Disposition: A | Discharge status: Home / Self Care | Payer: Self-pay | Source: Home / Self Care | Attending: Vascular Surgery

## 2020-12-06 ENCOUNTER — Ambulatory Visit
Admission: RE | Admit: 2020-12-06 | Discharge: 2020-12-06 | Disposition: A | Discharge status: Home / Self Care | Payer: Medicare Other | Attending: Vascular Surgery | Admitting: Vascular Surgery

## 2020-12-06 ENCOUNTER — Encounter: Payer: Self-pay | Admitting: Vascular Surgery

## 2020-12-06 ENCOUNTER — Other Ambulatory Visit: Payer: Self-pay

## 2020-12-06 DIAGNOSIS — Z452 Encounter for adjustment and management of vascular access device: Secondary | ICD-10-CM | POA: Diagnosis present

## 2020-12-06 DIAGNOSIS — I25119 Atherosclerotic heart disease of native coronary artery with unspecified angina pectoris: Secondary | ICD-10-CM | POA: Insufficient documentation

## 2020-12-06 DIAGNOSIS — E785 Hyperlipidemia, unspecified: Secondary | ICD-10-CM | POA: Diagnosis not present

## 2020-12-06 DIAGNOSIS — Z79899 Other long term (current) drug therapy: Secondary | ICD-10-CM | POA: Diagnosis not present

## 2020-12-06 DIAGNOSIS — F32A Depression, unspecified: Secondary | ICD-10-CM | POA: Diagnosis not present

## 2020-12-06 DIAGNOSIS — Z955 Presence of coronary angioplasty implant and graft: Secondary | ICD-10-CM | POA: Insufficient documentation

## 2020-12-06 DIAGNOSIS — N1831 Chronic kidney disease, stage 3a: Secondary | ICD-10-CM | POA: Diagnosis not present

## 2020-12-06 DIAGNOSIS — K219 Gastro-esophageal reflux disease without esophagitis: Secondary | ICD-10-CM | POA: Diagnosis not present

## 2020-12-06 DIAGNOSIS — Z853 Personal history of malignant neoplasm of breast: Secondary | ICD-10-CM

## 2020-12-06 DIAGNOSIS — I509 Heart failure, unspecified: Secondary | ICD-10-CM | POA: Diagnosis not present

## 2020-12-06 DIAGNOSIS — J189 Pneumonia, unspecified organism: Secondary | ICD-10-CM | POA: Insufficient documentation

## 2020-12-06 DIAGNOSIS — F419 Anxiety disorder, unspecified: Secondary | ICD-10-CM | POA: Insufficient documentation

## 2020-12-06 DIAGNOSIS — Z7902 Long term (current) use of antithrombotics/antiplatelets: Secondary | ICD-10-CM | POA: Diagnosis not present

## 2020-12-06 DIAGNOSIS — Z79811 Long term (current) use of aromatase inhibitors: Secondary | ICD-10-CM | POA: Diagnosis not present

## 2020-12-06 DIAGNOSIS — I130 Hypertensive heart and chronic kidney disease with heart failure and stage 1 through stage 4 chronic kidney disease, or unspecified chronic kidney disease: Secondary | ICD-10-CM | POA: Insufficient documentation

## 2020-12-06 HISTORY — PX: PORTA CATH REMOVAL: CATH118286

## 2020-12-06 SURGERY — PORTA CATH REMOVAL
Anesthesia: Moderate Sedation

## 2020-12-06 MED ORDER — SODIUM CHLORIDE 0.9 % IV SOLN: INTRAVENOUS | Status: DC

## 2020-12-06 MED ORDER — HYDROMORPHONE HCL 1 MG/ML IJ SOLN: 1.0000 mg | Freq: Once | INTRAMUSCULAR | Status: DC | PRN

## 2020-12-06 MED ORDER — FENTANYL CITRATE (PF) 100 MCG/2ML IJ SOLN
INTRAMUSCULAR | Status: AC
  Filled 2020-12-06: qty 2

## 2020-12-06 MED ORDER — MIDAZOLAM HCL 2 MG/2ML IJ SOLN
INTRAMUSCULAR | Status: AC
  Filled 2020-12-06: qty 2

## 2020-12-06 MED ORDER — MIDAZOLAM HCL 2 MG/ML PO SYRP: 8.0000 mg | ORAL_SOLUTION | Freq: Once | ORAL | Status: DC | PRN

## 2020-12-06 MED ORDER — MIDAZOLAM HCL 2 MG/2ML IJ SOLN
INTRAMUSCULAR | Status: DC | PRN
  Administered 2020-12-06: 1 mg via INTRAVENOUS

## 2020-12-06 MED ORDER — CLINDAMYCIN PHOSPHATE 300 MG/50ML IV SOLN: 300.0000 mg | Freq: Once | INTRAVENOUS | Status: AC

## 2020-12-06 MED ORDER — CLINDAMYCIN PHOSPHATE 300 MG/50ML IV SOLN
INTRAVENOUS | Status: AC
  Administered 2020-12-06: 300 mg via INTRAVENOUS
  Filled 2020-12-06: qty 50

## 2020-12-06 MED ORDER — CHLORHEXIDINE GLUCONATE CLOTH 2 % EX PADS
6.0000 | MEDICATED_PAD | Freq: Every day | CUTANEOUS | Status: DC
  Administered 2020-12-06: 6 via TOPICAL

## 2020-12-06 MED ORDER — FENTANYL CITRATE (PF) 100 MCG/2ML IJ SOLN
INTRAMUSCULAR | Status: DC | PRN
  Administered 2020-12-06: 50 ug via INTRAVENOUS

## 2020-12-06 MED ORDER — DIPHENHYDRAMINE HCL 50 MG/ML IJ SOLN: 50.0000 mg | Freq: Once | INTRAMUSCULAR | Status: DC | PRN

## 2020-12-06 MED ORDER — FAMOTIDINE 20 MG PO TABS: 40.0000 mg | ORAL_TABLET | Freq: Once | ORAL | Status: DC | PRN

## 2020-12-06 MED ORDER — ONDANSETRON HCL 4 MG/2ML IJ SOLN: 4.0000 mg | Freq: Four times a day (QID) | INTRAMUSCULAR | Status: DC | PRN

## 2020-12-06 MED ORDER — METHYLPREDNISOLONE SODIUM SUCC 125 MG IJ SOLR: 125.0000 mg | Freq: Once | INTRAMUSCULAR | Status: DC | PRN

## 2020-12-06 SURGICAL SUPPLY — 9 items
COVER SURGICAL LIGHT HANDLE (MISCELLANEOUS) ×2 IMPLANT
DERMABOND ADVANCED (GAUZE/BANDAGES/DRESSINGS) ×1
DERMABOND ADVANCED .7 DNX12 (GAUZE/BANDAGES/DRESSINGS) ×1 IMPLANT
PACK ANGIOGRAPHY (CUSTOM PROCEDURE TRAY) ×2 IMPLANT
PENCIL ELECTRO HAND CTR (MISCELLANEOUS) ×2 IMPLANT
SPONGE XRAY 4X4 16PLY STRL (MISCELLANEOUS) ×2 IMPLANT
SUT MNCRL AB 4-0 PS2 18 (SUTURE) ×2 IMPLANT
SUT VIC AB 3-0 SH 27 (SUTURE) ×1
SUT VIC AB 3-0 SH 27X BRD (SUTURE) ×1 IMPLANT

## 2020-12-06 NOTE — Op Note (Signed)
[facility]       Operative Note  Date: 12/06/2020  Preoperative diagnosis:  1. History of breast cancer, no longer using port  Postoperative diagnosis:  Same as above  Procedures: #1. Removal of left jugular port a cath   Surgeon: [name], MD  Anesthesia: Local with moderate conscious sedation for 14 minutes using 1 mg of Versed and 50 mcg of Fentanyl  Fluoroscopy time: none  Contrast used: 0  Estimated blood loss: Minimal  Indication for the procedure:  The patient is a 80 y.o. female who has completed her treatments with previous breast cancer and no longer needs their Port-A-Cath. The patient desires to have this removed. Risks and benefits including need for potential replacement with recurrent disease were discussed and patient is agreeable to proceed.  Description of procedure: The patient was brought to the vascular and interventional radiology suite. Moderate conscious sedation was administered during a face to face encounter with the patient throughout the procedure with my supervision of the RN administering medicines and monitoring the patient's vital signs, pulse oximetry, telemetry and mental status throughout from the start of the procedure until the patient was taken to the recovery room.  The left neck chest and shoulder were sterilely prepped and draped, and a sterile surgical field was created. The area was then anesthetized with 1% lidocaine copiously. The previous incision was reopened and electrocautery used to dissected down to the port and the catheter. These were dissected free and the catheter was gently removed from the vein in its entirety. The port was dissected out from the fibrous connective tissue and the Prolene sutures were removed. The port was then removed in its entirety including the catheter. The wound was then closed with a 3-0 Vicryl and a 4-0 Monocryl and Dermabond was placed as a  dressing. The patient was then taken to the recovery room in stable condition having tolerated the procedure well.  Complications: none  Condition: stable   [name], MD 12/06/2020 1:05 PM   This note was created with Dragon Medical transcription system. Any errors in dictation are purely unintentional.

## 2020-12-06 NOTE — Interval H&P Note (Signed)
History and Physical Interval Note:  12/06/2020 10:04 AM  Krista Cisneros  has presented today for surgery, with the diagnosis of Porta Cath Removal   Malignant neoplasm breast.  The various methods of treatment have been discussed with the patient and family. After consideration of risks, benefits and other options for treatment, the patient has consented to  Procedure(s): PORTA CATH REMOVAL (N/A) as a surgical intervention.  The patient's history has been reviewed, patient examined, no change in status, stable for surgery.  I have reviewed the patient's chart and labs.  Questions were answered to the patient's satisfaction.     [name]

## 2020-12-09 NOTE — H&P (Signed)
[facility] Admission History & Physical  MRN : [id number]  Krista Cisneros is a 80 y.o. (11-25-1940) female who presents with chief complaint of No chief complaint on file. Marland Kitchen  History of Present Illness: Patient presents today for removal of her permacath.  She has completed her chemotherapy and treatments for her right-sided breast cancer.  No new complaints today.  No fevers or chills.  No current facility-administered medications for this encounter.   Current Outpatient Medications  Medication Sig Dispense Refill   albuterol (PROVENTIL HFA;VENTOLIN HFA) 108 (90 Base) MCG/ACT inhaler Inhale 2 puffs into the lungs every 6 (six) hours as needed for wheezing or shortness of breath.     baclofen (LIORESAL) 10 MG tablet Take 10 mg by mouth at bedtime.  9   carvedilol (COREG) 3.125 MG tablet Take 2 tablets (6.25 mg total) by mouth 2 (two) times daily. 90 tablet 3   cholecalciferol (VITAMIN D3) 25 MCG (1000 UT) tablet Take 1,000 Units by mouth daily.     clopidogrel (PLAVIX) 75 MG tablet TAKE 1 TABLET (75 MG TOTAL) BY MOUTH DAILY WITH BREAKFAST. 90 tablet 3   Coenzyme Q10 300 MG CAPS Take 300 mg by mouth daily.      DULoxetine (CYMBALTA) 30 MG capsule Take 30 mg by mouth daily. (Take with '60mg'$  capsule to equal '90mg'$ )  11   DULoxetine (CYMBALTA) 60 MG capsule Take 60 mg by mouth daily. (Take with '30mg'$  capsule to equal '90mg'$ )     enalapril (VASOTEC) 5 MG tablet Take 1 tablet (5 mg total) by mouth daily. 90 tablet 3   feeding supplement (ENSURE ENLIVE / ENSURE PLUS) LIQD Take 237 mLs by mouth daily.     fluticasone furoate-vilanterol (BREO ELLIPTA) 200-25 MCG/INH AEPB Inhale 1 puff into the lungs daily.     furosemide (LASIX) 20 MG tablet Take 1 tablet (20 mg total) by mouth daily as needed for edema. 30 tablet 11   guaiFENesin-dextromethorphan (ROBITUSSIN DM) 100-10 MG/5ML syrup Take 5 mLs by mouth every 4 (four) hours as needed for cough. 118 mL 0   letrozole (FEMARA)  2.5 MG tablet TAKE 1 TABLET BY MOUTH EVERY DAY 90 tablet 3   lidocaine-prilocaine (EMLA) cream Apply 1 application as needed topically (for port access).     Magnesium Oxide 250 MG TABS Take 250 mg by mouth daily.     Melatonin 10 MG TABS Take 10 mg by mouth at bedtime as needed (sleep).     montelukast (SINGULAIR) 10 MG tablet Take 10 mg by mouth at bedtime.      pantoprazole (PROTONIX) 40 MG tablet TAKE 1 TABLET BY MOUTH EVERY DAY 90 tablet 0   rosuvastatin (CRESTOR) 20 MG tablet TAKE 1 TABLET BY MOUTH EVERY DAY AT 6PM 90 tablet 3   spironolactone (ALDACTONE) 25 MG tablet Take 0.5 tablets (12.5 mg total) by mouth daily. 45 tablet 3   tiotropium (SPIRIVA) 18 MCG inhalation capsule Place 18 mcg into inhaler and inhale daily.     fexofenadine (ALLEGRA) 180 MG tablet Take 180 mg by mouth daily. (Patient not taking: Reported on 12/06/2020)     fluticasone (FLONASE) 50 MCG/ACT nasal spray Place 2 sprays into the nose 2 (two) times daily as needed for allergies or rhinitis. (Patient not taking: Reported on 12/06/2020)     Facility-Administered Medications Ordered in Other Encounters  Medication Dose Route Frequency Provider Last Rate Last Admin   heparin lock flush 100 unit/mL  500 Units Intravenous Once [place],  Kathlene November, MD       heparin lock flush 100 unit/mL  500 Units Intravenous Once Lloyd Huger, MD       ondansetron [facility]) 8 mg in sodium chloride 0.9 % 50 mL IVPB   Intravenous Once Lloyd Huger, MD        Past Medical History:  Diagnosis Date   Anxiety    Arthritis    CAD (coronary artery disease)    a. NSTEMI 12/19; b. LHC 04/10/18: pLAD 95%, mLAD 80%, m-dLCx 95%, OM3-1 lesion 40%, OM3-2 lesion 60%, mRCA 50%, EF 35-45%, successful PCI/DES x 2 to the LAD with recommended staged PCI of the LCx in a few weeks   Cancer (HCC)    Right Breast Cancer   Chronic systolic CHF (congestive heart failure) ([place])    a. TTE 12/19: EF 30-35%, anteroseptal, anterior, and apical HK,  Gr1DD, mild to mod MR, mildly dilated LA, RVSF nl   COPD (chronic obstructive pulmonary disease) (HCC)    Depression    Dyspnea    with exertion   GERD (gastroesophageal reflux disease)    Hyperlipidemia    Hypertension    Personal history of chemotherapy 2018   chemo prior to lumpectomy of right breast   Personal history of radiation therapy 2019   right breast ca    Past Surgical History:  Procedure Laterality Date   ABDOMINAL HYSTERECTOMY  1990   Partial   BREAST BIOPSY Right 10/04/2016   axilla lymph node (metastatic carcinoma) and axillay tail mass biopsy-invasive mammary carcinoma   BREAST LUMPECTOMY Right 03/21/2017   chemo first, [facility] and metastatic LN   BREAST LUMPECTOMY Right 05/21/2017   re-excision for clip   BREAST LUMPECTOMY WITH NEEDLE LOCALIZATION Right 03/21/2017   Procedure: BREAST LUMPECTOMY WITH NEEDLE LOCALIZATION;  Surgeon: Clayburn Pert, MD;  Location: [facility];  Service: General;  Laterality: Right;   CORONARY STENT INTERVENTION N/A 04/10/2018   Procedure: CORONARY STENT INTERVENTION;  Surgeon: Wellington Hampshire, MD;  Location: [facility];  Service: Cardiovascular;  Laterality: N/A;   CORONARY STENT INTERVENTION N/A 05/14/2018   Procedure: CORONARY STENT INTERVENTION;  Surgeon: Nelva Bush, MD;  Location: [facility];  Service: Cardiovascular;  Laterality: N/A;   DILATION AND CURETTAGE OF UTERUS     INGUINAL HERNIA REPAIR Right 03/26/2017   Procedure: HERNIA REPAIR INGUINAL INCARCERATED;  Surgeon: Jules Husbands, MD;  Location: [facility];  Service: General;  Laterality: Right;   LEFT HEART CATH AND CORONARY ANGIOGRAPHY N/A 04/10/2018   Procedure: LEFT HEART CATH AND CORONARY ANGIOGRAPHY poss PCI;  Surgeon: Minna Merritts, MD;  Location: [facility];  Service: Cardiovascular;  Laterality: N/A;   PORTA CATH REMOVAL N/A 12/06/2020   Procedure: PORTA CATH REMOVAL;  Surgeon: Algernon Huxley, MD;  Location: [facility];   Service: Cardiovascular;  Laterality: N/A;   PORTACATH PLACEMENT Left 10/24/2016   Procedure: INSERTION PORT-A-CATH;  Surgeon: [name], MD;  Location: [facility];  Service: General;  Laterality: Left;   RE-EXCISION OF BREAST LUMPECTOMY Right 05/21/2017   Procedure: RE-EXCISION OF BREAST LUMPECTOMY;  Surgeon: Clayburn Pert, MD;  Location: [facility];  Service: General;  Laterality: Right;   SENTINEL NODE BIOPSY Right 03/21/2017   Procedure: SENTINEL NODE BIOPSY;  Surgeon: Clayburn Pert, MD;  Location: [facility];  Service: General;  Laterality: Right;     Social History   Tobacco Use   Smoking status: Former    Packs/day: 0.50  Types: Cigarettes    Quit date: 07/23/1988    Years since quitting: 32.4   Smokeless tobacco: Never  Vaping Use   Vaping Use: Never used  Substance Use Topics   Alcohol use: Yes    Comment: rare   Drug use: No     Family History  Problem Relation Age of Onset   Colon cancer Mother    Breast cancer Neg Hx     Allergies  Allergen Reactions   Nsaids     Bleeding risk   Penicillins Anaphylaxis, Swelling and Rash    Has patient had a PCN reaction causing immediate rash, facial/tongue/throat swelling, SOB or lightheadedness with hypotension: Yes Has patient had a PCN reaction causing severe rash involving mucus membranes or skin necrosis: No Has patient had a PCN reaction that required hospitalization: No  Has patient had a PCN reaction occurring within the last 10 years: No If all of the above answers are "NO", then may proceed with Cephalosporin use.    Tamsulosin Nausea Only   Atorvastatin Other (See Comments)    Muscle Pain   Gabapentin Swelling   Ezetimibe Other (See Comments)    Muscle pain   Aspirin     GI bleeding risk     REVIEW OF SYSTEMS (Negative unless checked)  Constitutional: '[]'$ Weight loss  '[]'$ Fever  '[]'$ Chills Cardiac: '[]'$ Chest pain   '[]'$ Chest pressure   '[]'$ Palpitations   '[]'$ Shortness of breath when laying flat   '[]'$ Shortness of  breath at rest   '[]'$ Shortness of breath with exertion. Vascular:  '[]'$ Pain in legs with walking   '[]'$ Pain in legs at rest   '[]'$ Pain in legs when laying flat   '[]'$ Claudication   '[]'$ Pain in feet when walking  '[]'$ Pain in feet at rest  '[]'$ Pain in feet when laying flat   '[]'$ History of DVT   '[]'$ Phlebitis   '[]'$ Swelling in legs   '[]'$ Varicose veins   '[]'$ Non-healing ulcers Pulmonary:   '[]'$ Uses home oxygen   '[]'$ Productive cough   '[]'$ Hemoptysis   '[]'$ Wheeze  '[]'$ COPD   '[]'$ Asthma Neurologic:  '[]'$ Dizziness  '[]'$ Blackouts   '[]'$ Seizures   '[]'$ History of stroke   '[]'$ History of TIA  '[]'$ Aphasia   '[]'$ Temporary blindness   '[]'$ Dysphagia   '[]'$ Weakness or numbness in arms   '[]'$ Weakness or numbness in legs Musculoskeletal:  '[]'$ Arthritis   '[]'$ Joint swelling   '[]'$ Joint pain   '[]'$ Low back pain Hematologic:  '[]'$ Easy bruising  '[]'$ Easy bleeding   '[]'$ Hypercoagulable state   '[]'$ Anemic  '[]'$ Hepatitis Gastrointestinal:  '[]'$ Blood in stool   '[]'$ Vomiting blood  '[]'$ Gastroesophageal reflux/heartburn   '[]'$ Difficulty swallowing. Genitourinary:  '[]'$ Chronic kidney disease   '[]'$ Difficult urination  '[]'$ Frequent urination  '[]'$ Burning with urination   '[]'$ Blood in urine Skin:  '[]'$ Rashes   '[]'$ Ulcers   '[]'$ Wounds Psychological:  '[]'$ History of anxiety   '[]'$  History of major depression.  Physical Examination  Vitals:   12/06/20 1330 12/06/20 1338 12/06/20 1345 12/06/20 1402  BP: (!) 142/61  127/61 119/66  Pulse:   74 78  Resp: '16  17 15  '$ Temp:      TempSrc:      SpO2:  94% 96% 97%  Weight:      Height:       Body mass index is 25.61 kg/m. Gen: WD/WN, NAD Head: [place]/AT, No temporalis wasting.  Ear/Nose/Throat: Hearing grossly intact, nares w/o erythema or drainage, oropharynx w/o Erythema/Exudate,  Eyes: Conjunctiva clear, sclera non-icteric Neck: Trachea midline.  No JVD.  Pulmonary:  Good air movement, respirations not labored, no use of accessory muscles.  Cardiac: RRR, normal S1, S2. Vascular: port in left chest Vessel Right Left  Radial Palpable Palpable               Musculoskeletal:  M/S 5/5 throughout.  Extremities without ischemic changes.  No deformity or atrophy.  Neurologic: Sensation grossly intact in extremities.  Symmetrical.  Speech is fluent. Motor exam as listed above. Psychiatric: Judgment intact, Mood & affect appropriate for pt's clinical situation. Dermatologic: No rashes or ulcers noted.  No cellulitis or open wounds.      CBC Lab Results  Component Value Date   WBC 14.8 (H) 11/17/2020   HGB 12.4 11/17/2020   HCT 39.6 11/17/2020   MCV 83.4 11/17/2020   PLT 336 11/17/2020    BMET    Component Value Date/Time   NA 141 11/17/2020 0442   NA 140 12/01/2019 1445   NA 141 04/08/2014 1422   K 3.8 11/17/2020 0442   K 3.9 04/08/2014 1422   CL 110 11/17/2020 0442   CL 107 04/08/2014 1422   CO2 25 11/17/2020 0442   CO2 28 04/08/2014 1422   GLUCOSE 96 11/17/2020 0442   GLUCOSE 95 04/08/2014 1422   BUN 46 (H) 11/17/2020 0442   BUN 29 (H) 12/01/2019 1445   BUN 11 04/08/2014 1422   CREATININE 1.37 (H) 11/17/2020 0442   CREATININE 1.09 04/08/2014 1422   CALCIUM 8.6 (L) 11/17/2020 0442   CALCIUM 8.8 04/08/2014 1422   GFRNONAA 39 (L) 11/17/2020 0442   GFRNONAA 52 (L) 04/08/2014 1422   GFRAA 39 (L) 12/01/2019 1445   GFRAA >60 04/08/2014 1422   CrCl cannot be calculated (Patient's most recent lab result is older than the maximum 21 days allowed.).  COAG Lab Results  Component Value Date   INR 1.1 11/13/2020   INR 1.2 10/07/2019   INR 1.2 05/28/2019    Radiology CT HEAD WO CONTRAST  Result Date: 11/13/2020 CLINICAL DATA:  Fall, shortness of breath, weakness EXAM: CT HEAD WITHOUT CONTRAST TECHNIQUE: Contiguous axial images were obtained from the base of the skull through the vertex without intravenous contrast. COMPARISON:  None. FINDINGS: Brain: No evidence of acute infarction, hemorrhage, hydrocephalus, extra-axial collection, visible mass lesion or mass effect. Symmetric prominence of the ventricles, cisterns and sulci compatible with  parenchymal volume loss. Patchy areas of white matter hypoattenuation are most compatible with chronic microvascular angiopathy. Vascular: Atherosclerotic calcification of the carotid siphons and intradural vertebral arteries. No hyperdense vessel. Skull: No calvarial fracture or suspicious osseous lesion. No scalp swelling or hematoma. Sinuses/Orbits: Layering air-fluid level seen in the maxillary sinuses. More mild mural thickening in the ethmoids. Mastoid air cells are clear. Middle ear cavities are clear. Included orbital structures are unremarkable. Other: None IMPRESSION: No acute intracranial abnormality. Background of intracranial atherosclerosis, microvascular angiopathy and parenchymal volume loss. Layering air-fluid levels in the maxillary sinuses, may correlate for clinical features of acute sinusitis. Electronically Signed   By: Lovena Le M.D.   On: 11/13/2020 19:57   PERIPHERAL VASCULAR CATHETERIZATION  Result Date: 12/06/2020 See surgical note for result.  DG Chest Port 1 View  Result Date: 11/13/2020 CLINICAL DATA:  Sepsis. EXAM: PORTABLE CHEST 1 VIEW COMPARISON:  08/18/2020 and chest CT 06/17/2020. FINDINGS: Again noted are severe emphysematous changes in the upper lungs. Slightly increased interstitial lung densities in lower chest bilaterally. New focal densities in the right mid lung region. Stable position of left subclavian Port-A-Cath with tip in the SVC region. Heart size is within normal limits and stable. Atherosclerotic  calcifications at the aortic arch. Surgical clips in the right axilla. IMPRESSION: 1. Severe emphysema with slightly increased interstitial densities in the lower lungs and concern for more focal disease in the right mid lung region. Differential diagnosis includes atypical infection versus mild interstitial edema. 2. Stable position of the left subclavian Port-A-Cath. Electronically Signed   By: Markus Daft M.D.   On: 11/13/2020 13:34     Assessment/Plan 1.   History of breast cancer now completed therapy.  Desires to have port removed.  Risks and benefits are discussed. 2.  Hypertension.  Stable on outpatient medications and blood pressure control important in reducing the progression of atherosclerotic disease. On appropriate oral medications.     [name], MD  12/09/2020 10:04 AM

## 2021-02-19 ENCOUNTER — Other Ambulatory Visit: Payer: Self-pay | Admitting: Cardiovascular Disease

## 2021-02-21 ENCOUNTER — Other Ambulatory Visit: Payer: Self-pay | Admitting: *Deleted

## 2021-02-21 MED ORDER — LETROZOLE 2.5 MG PO TABS: 2.5000 mg | ORAL_TABLET | Freq: Every day | ORAL | 1 refills | Status: DC

## 2021-03-22 ENCOUNTER — Other Ambulatory Visit: Payer: Self-pay | Admitting: Family

## 2021-04-11 ENCOUNTER — Other Ambulatory Visit: Payer: Self-pay | Admitting: Oncology

## 2021-04-11 DIAGNOSIS — R921 Mammographic calcification found on diagnostic imaging of breast: Secondary | ICD-10-CM

## 2021-04-11 DIAGNOSIS — R928 Other abnormal and inconclusive findings on diagnostic imaging of breast: Secondary | ICD-10-CM

## 2021-04-11 DIAGNOSIS — Z1231 Encounter for screening mammogram for malignant neoplasm of breast: Secondary | ICD-10-CM

## 2021-04-15 DIAGNOSIS — I25119 Atherosclerotic heart disease of native coronary artery with unspecified angina pectoris: Secondary | ICD-10-CM | POA: Diagnosis not present

## 2021-04-15 DIAGNOSIS — Z87891 Personal history of nicotine dependence: Secondary | ICD-10-CM | POA: Insufficient documentation

## 2021-04-15 DIAGNOSIS — I5032 Chronic diastolic (congestive) heart failure: Secondary | ICD-10-CM | POA: Diagnosis not present

## 2021-04-15 DIAGNOSIS — Z8616 Personal history of COVID-19: Secondary | ICD-10-CM | POA: Diagnosis not present

## 2021-04-15 DIAGNOSIS — E860 Dehydration: Secondary | ICD-10-CM | POA: Insufficient documentation

## 2021-04-15 DIAGNOSIS — N1831 Chronic kidney disease, stage 3a: Secondary | ICD-10-CM | POA: Diagnosis not present

## 2021-04-15 DIAGNOSIS — U071 COVID-19: Secondary | ICD-10-CM | POA: Diagnosis not present

## 2021-04-15 DIAGNOSIS — Z79899 Other long term (current) drug therapy: Secondary | ICD-10-CM | POA: Diagnosis not present

## 2021-04-15 DIAGNOSIS — I130 Hypertensive heart and chronic kidney disease with heart failure and stage 1 through stage 4 chronic kidney disease, or unspecified chronic kidney disease: Secondary | ICD-10-CM | POA: Diagnosis not present

## 2021-04-15 DIAGNOSIS — J449 Chronic obstructive pulmonary disease, unspecified: Secondary | ICD-10-CM | POA: Diagnosis not present

## 2021-04-15 DIAGNOSIS — Z853 Personal history of malignant neoplasm of breast: Secondary | ICD-10-CM | POA: Insufficient documentation

## 2021-04-15 DIAGNOSIS — R531 Weakness: Secondary | ICD-10-CM | POA: Insufficient documentation

## 2021-04-15 DIAGNOSIS — Z7951 Long term (current) use of inhaled steroids: Secondary | ICD-10-CM | POA: Insufficient documentation

## 2021-04-15 DIAGNOSIS — N3090 Cystitis, unspecified without hematuria: Secondary | ICD-10-CM | POA: Insufficient documentation

## 2021-04-15 DIAGNOSIS — Z955 Presence of coronary angioplasty implant and graft: Secondary | ICD-10-CM | POA: Diagnosis not present

## 2021-04-15 DIAGNOSIS — R059 Cough, unspecified: Secondary | ICD-10-CM | POA: Diagnosis present

## 2021-04-15 LAB — URINALYSIS, ROUTINE W REFLEX MICROSCOPIC
Bilirubin Urine: NEGATIVE
Glucose, UA: NEGATIVE mg/dL
Hgb urine dipstick: NEGATIVE
Ketones, ur: NEGATIVE mg/dL
Nitrite: POSITIVE — AB
Protein, ur: 100 mg/dL — AB
Specific Gravity, Urine: 1.015 (ref 1.005–1.030)
pH: >9 — ABNORMAL HIGH (ref 5.0–8.0)

## 2021-04-15 LAB — CBC WITH DIFFERENTIAL/PLATELET
Abs Immature Granulocytes: 0.06 10*3/uL (ref 0.00–0.07)
Basophils Absolute: 0.1 10*3/uL (ref 0.0–0.1)
Basophils Relative: 1 %
Eosinophils Absolute: 0.2 10*3/uL (ref 0.0–0.5)
Eosinophils Relative: 2 %
HCT: 41.6 % (ref 36.0–46.0)
Hemoglobin: 12.6 g/dL (ref 12.0–15.0)
Immature Granulocytes: 1 %
Lymphocytes Relative: 15 %
Lymphs Abs: 1.9 10*3/uL (ref 0.7–4.0)
MCH: 24.7 pg — ABNORMAL LOW (ref 26.0–34.0)
MCHC: 30.3 g/dL (ref 30.0–36.0)
MCV: 81.6 fL (ref 80.0–100.0)
Monocytes Absolute: 1.8 10*3/uL — ABNORMAL HIGH (ref 0.1–1.0)
Monocytes Relative: 15 %
Neutro Abs: 8.4 10*3/uL — ABNORMAL HIGH (ref 1.7–7.7)
Neutrophils Relative %: 66 %
Platelets: 323 10*3/uL (ref 150–400)
RBC: 5.1 MIL/uL (ref 3.87–5.11)
RDW: 18.9 % — ABNORMAL HIGH (ref 11.5–15.5)
WBC: 12.5 10*3/uL — ABNORMAL HIGH (ref 4.0–10.5)
nRBC: 0 % (ref 0.0–0.2)

## 2021-04-15 LAB — COMPREHENSIVE METABOLIC PANEL
ALT: 9 U/L (ref 0–44)
AST: 18 U/L (ref 15–41)
Albumin: 3.7 g/dL (ref 3.5–5.0)
Alkaline Phosphatase: 56 U/L (ref 38–126)
Anion gap: 8 (ref 5–15)
BUN: 35 mg/dL — ABNORMAL HIGH (ref 8–23)
CO2: 26 mmol/L (ref 22–32)
Calcium: 9.4 mg/dL (ref 8.9–10.3)
Chloride: 99 mmol/L (ref 98–111)
Creatinine, Ser: 1.48 mg/dL — ABNORMAL HIGH (ref 0.44–1.00)
GFR, Estimated: 36 mL/min — ABNORMAL LOW (ref >60)
Glucose, Bld: 123 mg/dL — ABNORMAL HIGH (ref 70–99)
Potassium: 4.5 mmol/L (ref 3.5–5.1)
Sodium: 133 mmol/L — ABNORMAL LOW (ref 135–145)
Total Bilirubin: 0.7 mg/dL (ref 0.3–1.2)
Total Protein: 7.7 g/dL (ref 6.5–8.1)

## 2021-04-15 NOTE — ED Triage Notes (Signed)
Pt presents today via POV with complaints of weakness starting 3 days go - pt states that she has had to use a walker for the last several days when in the past she has been walking without any assistance. Denies CP or SOB.

## 2021-04-16 ENCOUNTER — Emergency Department: Payer: Medicare Other

## 2021-04-16 ENCOUNTER — Emergency Department
Admission: EM | Admit: 2021-04-16 | Discharge: 2021-04-16 | Disposition: A | Discharge status: Home / Self Care | Payer: Medicare Other | Attending: Emergency Medicine | Admitting: Emergency Medicine

## 2021-04-16 DIAGNOSIS — E860 Dehydration: Secondary | ICD-10-CM

## 2021-04-16 DIAGNOSIS — N3090 Cystitis, unspecified without hematuria: Secondary | ICD-10-CM

## 2021-04-16 LAB — RESP PANEL BY RT-PCR (FLU A&B, COVID) ARPGX2
Influenza A by PCR: NEGATIVE
Influenza B by PCR: NEGATIVE
SARS Coronavirus 2 by RT PCR: POSITIVE — AB

## 2021-04-16 IMAGING — DX DG CHEST 1V PORT
1 series · 1 of 1 positions shown · non-contrast
Comparison: [DATE]

CLINICAL DATA: Cough, weakness

EXAM:
PORTABLE CHEST 1 VIEW

[chest ap]
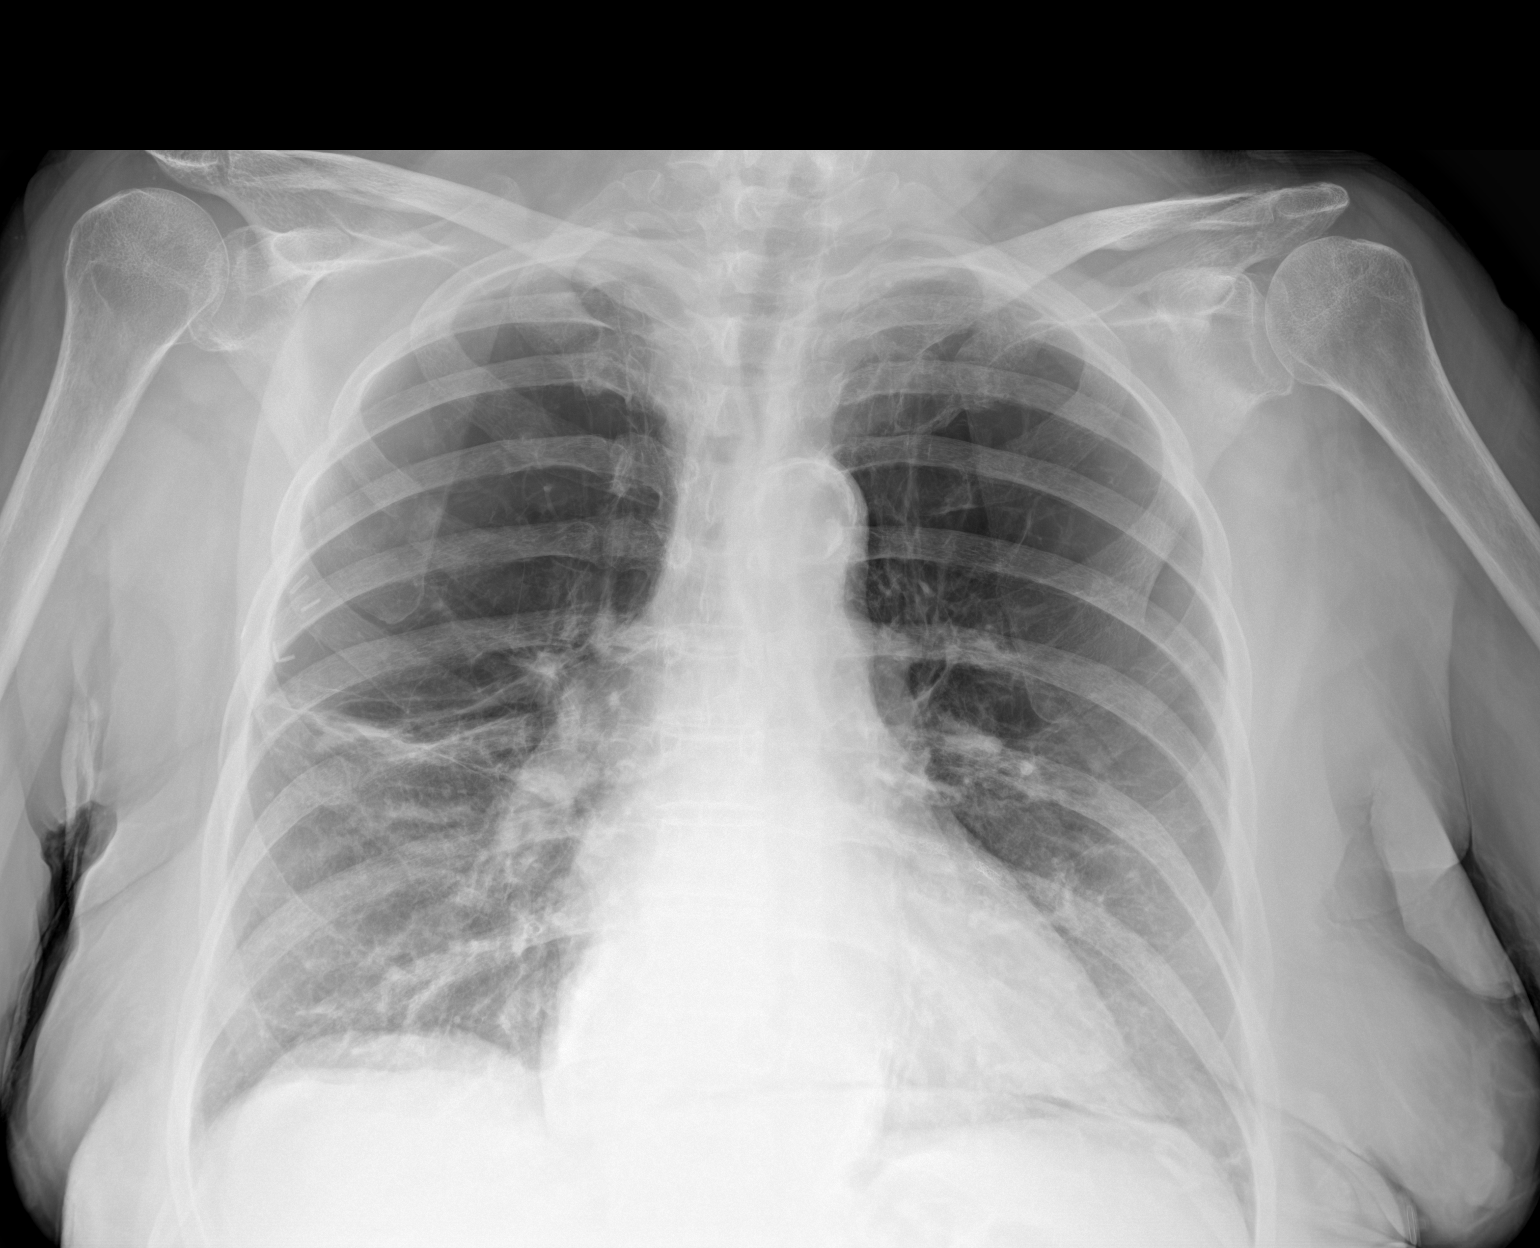

[1 of 1 positions shown; findings below may reference images not displayed]

FINDINGS: Heart is normal size. Aortic atherosclerosis. Bullous emphysema. No
confluent airspace opacities. Scarring in the right mid lung. No
effusions or acute bony abnormality.
IMPRESSION: Bullous emphysema.  Chronic changes.  No active disease.

## 2021-04-16 MED ORDER — ONDANSETRON 4 MG PO TBDP: 4.0000 mg | ORAL_TABLET | Freq: Three times a day (TID) | ORAL | 0 refills | Status: DC | PRN

## 2021-04-16 MED ORDER — SODIUM CHLORIDE 0.9 % IV BOLUS
1000.0000 mL | Freq: Once | INTRAVENOUS | Status: AC
  Administered 2021-04-16: 05:00:00 1000 mL via INTRAVENOUS

## 2021-04-16 MED ORDER — CIPROFLOXACIN HCL 500 MG PO TABS: 500.0000 mg | ORAL_TABLET | Freq: Two times a day (BID) | ORAL | 0 refills | Status: DC

## 2021-04-16 MED ORDER — CIPROFLOXACIN HCL 500 MG PO TABS
500.0000 mg | ORAL_TABLET | ORAL | Status: AC
  Administered 2021-04-16: 05:00:00 500 mg via ORAL
  Filled 2021-04-16: qty 1

## 2021-04-16 MED ORDER — ONDANSETRON HCL 4 MG/2ML IJ SOLN
4.0000 mg | Freq: Once | INTRAMUSCULAR | Status: AC
  Administered 2021-04-16: 05:00:00 4 mg via INTRAVENOUS
  Filled 2021-04-16: qty 2

## 2021-04-16 NOTE — ED Notes (Signed)
Assisted pt to toilet 

## 2021-04-16 NOTE — ED Notes (Signed)
Water given  

## 2021-04-16 NOTE — ED Notes (Signed)
Pt ambulated in room with steady gait and no difficulty reported; pt reports no nausea and feels less weak than upon arrival

## 2021-04-16 NOTE — ED Provider Notes (Signed)
[facility] Emergency Department Provider Note  ____________________________________________  Time seen: Approximately 6:57 AM  I have reviewed the triage vital signs and the nursing notes.   HISTORY  Chief Complaint Weakness    HPI Krista Cisneros is a 80 y.o. female with a history of CAD CHF COPD hypertension who was brought to the ED due to generalized weakness for the past 3 days.  Normally walks without assistance but has had to use a walker For the last few days and seems increasingly unsteady on her feet according to family.  They report usually when this happens she is either dehydrated or has a UTI.  They note that she has had decreased oral intake for the last few days as well.  No vomiting diarrhea or fever.  Patient denies chest pain or shortness of breath.  She does have a persistent cough ever since having COVID a month ago.    Past Medical History:  Diagnosis Date   Anxiety    Arthritis    CAD (coronary artery disease)    a. NSTEMI 12/19; b. LHC 04/10/18: pLAD 95%, mLAD 80%, m-dLCx 95%, OM3-1 lesion 40%, OM3-2 lesion 60%, mRCA 50%, EF 35-45%, successful PCI/DES x 2 to the LAD with recommended staged PCI of the LCx in a few weeks   Cancer (HCC)    Right Breast Cancer   Chronic systolic CHF (congestive heart failure) ([place])    a. TTE 12/19: EF 30-35%, anteroseptal, anterior, and apical HK, Gr1DD, mild to mod MR, mildly dilated LA, RVSF nl   COPD (chronic obstructive pulmonary disease) (HCC)    Depression    Dyspnea    with exertion   GERD (gastroesophageal reflux disease)    Hyperlipidemia    Hypertension    Personal history of chemotherapy 2018   chemo prior to lumpectomy of right breast   Personal history of radiation therapy 2019   right breast ca     Patient Active Problem List   Diagnosis Date Noted   Acute respiratory failure with hypoxia ([name]) 11/14/2020   COPD with acute exacerbation ([place]) 11/13/2020   Community acquired  pneumonia 06/17/2020   Generalized weakness 06/17/2020   History of COVID-19, December 2021 06/17/2020   History of breast cancer 06/17/2020   Chronic diastolic CHF (congestive heart failure) ([place]) 06/17/2020   Stage 3a chronic kidney disease ([place]) 06/17/2020   Sepsis due to Escherichia coli (E. coli) ([place]) 10/10/2019   Acute lower UTI 10/10/2019   SIRS (systemic inflammatory response syndrome) ([place]) 10/07/2019   Constipation 10/07/2019   Urinary retention 10/07/2019   Iron deficiency anemia 05/30/2019   Ischemic cardiomyopathy 06/03/2018   Coronary artery disease involving native coronary artery of native heart with angina pectoris ([place]) 00/86/7619   Chronic systolic heart failure (HCC)    Coronary artery disease involving native coronary artery of native heart without angina pectoris 04/22/2018   Non-STEMI (non-ST elevated myocardial infarction) ([place]) 04/22/2018   NSTEMI (non-ST elevated myocardial infarction) ([place]) 04/09/2018   Rectal bleeding 03/22/2018   Malignant neoplasm of upper-outer quadrant of right breast in female, estrogen receptor negative ([place]) 06/19/2017   Incarcerated hernia 03/26/2017   Incarcerated inguinal hernia    Acute kidney injury ([place]) 02/07/2017   Hypomagnesemia 01/30/2017   Goals of care, counseling/discussion 10/20/2016   Malignant neoplasm of right female breast ([place]) 10/16/2016   Hyperlipidemia, mixed 10/08/2016   Chronic venous insufficiency 09/14/2016   GERD (gastroesophageal reflux disease) 09/14/2016   Pain in limb 05/29/2016   Essential  hypertension 05/29/2016   COPD (chronic obstructive pulmonary disease) ([place]) 05/29/2016   Hardening of the aorta (main artery of the heart) ([place]) 01/07/2016   Hyperglycemia, unspecified 01/07/2016   Neuropathy 11/15/2015   Severe recurrent major depression without psychotic features ([place]) 09/10/2015   Allergic rhinitis 04/10/2014   Depression 04/10/2014   Obesity, unspecified 04/10/2014     Past Surgical  History:  Procedure Laterality Date   ABDOMINAL HYSTERECTOMY  1990   Partial   BREAST BIOPSY Right 10/04/2016   axilla lymph node (metastatic carcinoma) and axillay tail mass biopsy-invasive mammary carcinoma   BREAST LUMPECTOMY Right 03/21/2017   chemo first, [facility] and metastatic LN   BREAST LUMPECTOMY Right 05/21/2017   re-excision for clip   BREAST LUMPECTOMY WITH NEEDLE LOCALIZATION Right 03/21/2017   Procedure: BREAST LUMPECTOMY WITH NEEDLE LOCALIZATION;  Surgeon: Clayburn Pert, MD;  Location: [facility] ORS;  Service: General;  Laterality: Right;   CORONARY STENT INTERVENTION N/A 04/10/2018   Procedure: CORONARY STENT INTERVENTION;  Surgeon: Wellington Hampshire, MD;  Location: [place] [facility];  Service: Cardiovascular;  Laterality: N/A;   CORONARY STENT INTERVENTION N/A 05/14/2018   Procedure: CORONARY STENT INTERVENTION;  Surgeon: Nelva Bush, MD;  Location: [place] [facility];  Service: Cardiovascular;  Laterality: N/A;   DILATION AND CURETTAGE OF UTERUS     INGUINAL HERNIA REPAIR Right 03/26/2017   Procedure: HERNIA REPAIR INGUINAL INCARCERATED;  Surgeon: Jules Husbands, MD;  Location: [facility] OR[facility];  Service: General;  Laterality: Right;   LEFT HEART CATH AND CORONARY ANGIOGRAPHY N/A 04/10/2018   Procedure: LEFT HEART CATH AND CORONARY ANGIOGRAPHY poss PCI;  Surgeon: Minna Merritts, MD;  Location: [place] [facility];  Service: Cardiovascular;  Laterality: N/A;   PORTA CATH REMOVAL N/A 12/06/2020   Procedure: PORTA CATH REMOVAL;  Surgeon: Algernon Huxley, MD;  Location: [place] [facility];  Service: Cardiovascular;  Laterality: N/A;   PORTACATH PLACEMENT Left 10/24/2016   Procedure: INSERTION PORT-A-CATH;  Surgeon: [name], MD;  Location: [facility];  Service: General;  Laterality: Left;   RE-EXCISION OF BREAST LUMPECTOMY Right 05/21/2017   Procedure: RE-EXCISION OF BREAST LUMPECTOMY;  Surgeon: Clayburn Pert, MD;  Location: [facility];  Service: General;  Laterality: Right;    SENTINEL NODE BIOPSY Right 03/21/2017   Procedure: SENTINEL NODE BIOPSY;  Surgeon: Clayburn Pert, MD;  Location: [facility];  Service: General;  Laterality: Right;     Prior to Admission medications   Medication Sig Start Date End Date Taking? Authorizing Provider  ciprofloxacin (CIPRO) 500 MG tablet Take 1 tablet (500 mg total) by mouth 2 (two) times daily. 04/16/21  Yes Carrie Mew, MD  ondansetron (ZOFRAN-ODT) 4 MG disintegrating tablet Take 1 tablet (4 mg total) by mouth every 8 (eight) hours as needed for nausea or vomiting. 04/16/21  Yes Carrie Mew, MD  albuterol (PROVENTIL HFA;VENTOLIN HFA) 108 (90 Base) MCG/ACT inhaler Inhale 2 puffs into the lungs every 6 (six) hours as needed for wheezing or shortness of breath.    [provider]  baclofen (LIORESAL) 10 MG tablet Take 10 mg by mouth at bedtime. 01/14/17   [provider]  carvedilol (COREG) 3.125 MG tablet Take 2 tablets (6.25 mg total) by mouth 2 (two) times daily. 10/04/20   Minna Merritts, MD  cholecalciferol (VITAMIN D3) 25 MCG (1000 UT) tablet Take 1,000 Units by mouth daily.    [provider]  clopidogrel (PLAVIX) 75 MG tablet TAKE 1 TABLET (75 MG TOTAL) BY MOUTH DAILY WITH  BREAKFAST. 10/05/20   Minna Merritts, MD  Coenzyme Q10 300 MG CAPS Take 300 mg by mouth daily.     [provider]  DULoxetine (CYMBALTA) 30 MG capsule Take 30 mg by mouth daily. (Take with 60mg  capsule to equal 90mg )    [provider]  DULoxetine (CYMBALTA) 60 MG capsule Take 60 mg by mouth daily. (Take with 30mg  capsule to equal 90mg )    [provider]  enalapril (VASOTEC) 5 MG tablet TAKE 1 TABLET (5 MG TOTAL) BY MOUTH DAILY. 03/22/21   Loel Dubonnet, NP  feeding supplement (ENSURE ENLIVE / ENSURE PLUS) LIQD Take 237 mLs by mouth daily. 11/17/20   Enzo Bi, MD  fexofenadine (ALLEGRA) 180 MG tablet Take 180 mg by mouth daily. Patient not taking: Reported on 12/06/2020 09/14/20    [provider]  fluticasone (FLONASE) 50 MCG/ACT nasal spray Place 2 sprays into the nose 2 (two) times daily as needed for allergies or rhinitis. Patient not taking: Reported on 12/06/2020    [provider]  fluticasone furoate-vilanterol (BREO ELLIPTA) 200-25 MCG/INH AEPB Inhale 1 puff into the lungs daily.    [provider]  furosemide (LASIX) 20 MG tablet Take 1 tablet (20 mg total) by mouth daily as needed for edema. 10/04/20   Minna Merritts, MD  guaiFENesin-dextromethorphan (ROBITUSSIN DM) 100-10 MG/5ML syrup Take 5 mLs by mouth every 4 (four) hours as needed for cough. 06/20/20   Sidney Ace, MD  letrozole (FEMARA) 2.5 MG tablet Take 1 tablet (2.5 mg total) by mouth daily. 02/21/21   Lloyd Huger, MD  lidocaine-prilocaine (EMLA) cream Apply 1 application as needed topically (for port access).    [provider]  Magnesium Oxide 250 MG TABS Take 250 mg by mouth daily.    [provider]  Melatonin 10 MG TABS Take 10 mg by mouth at bedtime as needed (sleep).    [provider]  montelukast (SINGULAIR) 10 MG tablet Take 10 mg by mouth at bedtime.  05/23/16   [provider]  pantoprazole (PROTONIX) 40 MG tablet TAKE 1 TABLET BY MOUTH EVERY DAY 02/21/21   Minna Merritts, MD  rosuvastatin (CRESTOR) 20 MG tablet TAKE 1 TABLET BY MOUTH EVERY DAY AT 6PM 12/03/20   Minna Merritts, MD  spironolactone (ALDACTONE) 25 MG tablet Take 0.5 tablets (12.5 mg total) by mouth daily. 10/04/20   Minna Merritts, MD  tiotropium (SPIRIVA) 18 MCG inhalation capsule Place 18 mcg into inhaler and inhale daily.    [provider]  prochlorperazine (COMPAZINE) 10 MG tablet Take 1 tablet (10 mg total) by mouth every 6 (six) hours as needed (Nausea or vomiting). 10/20/16 07/31/17  Lloyd Huger, MD     Allergies Nsaids, Penicillins, Tamsulosin, Atorvastatin, Gabapentin, Ezetimibe, and Aspirin   Family History  Problem  Relation Age of Onset   Colon cancer Mother    Breast cancer Neg Hx     Social History Social History   Tobacco Use   Smoking status: Former    Packs/day: 0.50    Types: Cigarettes    Quit date: 07/23/1988    Years since quitting: 32.7   Smokeless tobacco: Never  Vaping Use   Vaping Use: Never used  Substance Use Topics   Alcohol use: Yes    Comment: rare   Drug use: No    Review of Systems  Constitutional:   No fever or chills.  ENT:   No sore throat. No  rhinorrhea. Cardiovascular:   No chest pain or syncope. Respiratory:   No dyspnea or cough. Gastrointestinal:   Negative for abdominal pain, vomiting and diarrhea.  Musculoskeletal:   Negative for focal pain or swelling All other systems reviewed and are negative except as documented above in ROS and HPI.  ____________________________________________   PHYSICAL EXAM:  VITAL SIGNS: ED Triage Vitals  Enc Vitals Group     BP 04/15/21 2152 126/79     Pulse Rate 04/15/21 2152 62     Resp 04/15/21 2152 20     Temp 04/15/21 2159 98.5 F (36.9 C)     Temp Source 04/15/21 2152 Oral     SpO2 04/15/21 2152 90 %     Weight 04/15/21 2201 140 lb (63.5 kg)     Height 04/15/21 2201 5\' 2"  (1.575 m)     Head Circumference --      Peak Flow --      Pain Score 04/15/21 2200 0     Pain Loc --      Pain Edu? --      Excl. in [place]? --     Vital signs reviewed, nursing assessments reviewed.   Constitutional:   Alert and oriented. Non-toxic appearance. Eyes:   Conjunctivae are normal. EOMI. PERRL. ENT      Head:   Normocephalic and atraumatic.      Nose:   Normal.      Mouth/Throat:   Normal, moist mucosa      Neck:   No meningismus. Full ROM. Hematological/Lymphatic/Immunilogical:   No cervical lymphadenopathy. Cardiovascular:   RRR. Symmetric bilateral radial and DP pulses.  No murmurs. Cap refill less than 2 seconds. Respiratory:   Normal respiratory effort without tachypnea/retractions. Breath sounds are clear and equal  bilaterally. No wheezes/rales/rhonchi. Gastrointestinal:   Soft and nontender. Non distended. There is no CVA tenderness.  No rebound, rigidity, or guarding. Genitourinary:   deferred Musculoskeletal:   Normal range of motion in all extremities. No joint effusions.  No lower extremity tenderness.  No edema. Neurologic:   Normal speech and language.  Motor grossly intact. No acute focal neurologic deficits are appreciated.  Skin:    Skin is warm, dry and intact. No rash noted.  No petechiae, purpura, or bullae.  ____________________________________________    LABS (pertinent positives/negatives) (all labs ordered are listed, but only abnormal results are displayed) Labs Reviewed  RESP PANEL BY RT-PCR (FLU A&B, COVID) ARPGX2 - Abnormal; Notable for the following components:      Result Value   SARS Coronavirus 2 by RT PCR POSITIVE (*)    All other components within normal limits  CBC WITH DIFFERENTIAL/PLATELET - Abnormal; Notable for the following components:   WBC 12.5 (*)    MCH 24.7 (*)    RDW 18.9 (*)    Neutro Abs 8.4 (*)    Monocytes Absolute 1.8 (*)    All other components within normal limits  COMPREHENSIVE METABOLIC PANEL - Abnormal; Notable for the following components:   Sodium 133 (*)    Glucose, Bld 123 (*)    BUN 35 (*)    Creatinine, Ser 1.48 (*)    GFR, Estimated 36 (*)    All other components within normal limits  URINALYSIS, ROUTINE W REFLEX MICROSCOPIC - Abnormal; Notable for the following components:   pH >9.0 (*)    Protein, ur 100 (*)    Nitrite POSITIVE (*)    Leukocytes,Ua MODERATE (*)    Bacteria, UA RARE (*)  All other components within normal limits  URINE CULTURE   ____________________________________________   EKG  Interpreted by me Normal sinus rhythm rate of 92.  Normal axis and intervals.  Normal QRS ST segments and T waves.  No ischemic changes.  ____________________________________________    [id number]  DG Chest Portable 1  View  Result Date: 04/16/2021 CLINICAL DATA:  Cough, weakness EXAM: PORTABLE CHEST 1 VIEW COMPARISON:  11/13/2020 FINDINGS: Heart is normal size. Aortic atherosclerosis. Bullous emphysema. No confluent airspace opacities. Scarring in the right mid lung. No effusions or acute bony abnormality. IMPRESSION: Bullous emphysema.  Chronic changes.  No active disease. Electronically Signed   By: Rolm Baptise M.D.   On: 04/16/2021 05:13    ____________________________________________   PROCEDURES Procedures  ____________________________________________  DIFFERENTIAL DIAGNOSIS   Dehydration, electrolyte abnormality, UTI, pneumonia, influenza, anemia  CLINICAL IMPRESSION / ASSESSMENT AND PLAN / ED COURSE  Medications ordered in the ED: Medications  sodium chloride 0.9 % bolus 1,000 mL (1,000 mLs Intravenous New Bag/Given 04/16/21 0528)  ondansetron (ZOFRAN) injection 4 mg (4 mg Intravenous Given 04/16/21 0528)  ciprofloxacin (CIPRO) tablet 500 mg (500 mg Oral Given 04/16/21 0528)    Pertinent labs & imaging results that were available during my care of the patient were reviewed by me and considered in my medical decision making (see chart for details).  Krista Cisneros was evaluated in Emergency Department on 04/16/2021 for the symptoms described in the history of present illness. She was evaluated in the context of the global COVID-19 pandemic, which necessitated consideration that the patient might be at risk for infection with the SARS-CoV-2 virus that causes COVID-19. Institutional protocols and algorithms that pertain to the evaluation of patients at risk for COVID-19 are in a state of rapid change based on information released by regulatory bodies including the CDC and federal and state organizations. These policies and algorithms were followed during the patient's care in the ED.   Patient presents with generalized weakness for the past 3 days.  Labs overall unremarkable, baseline CKD.   Urinalysis is consistent with cystitis.  Urine culture added.  Prior microbiology data reviewed from recent UTIs earlier this year, showing tetracycline, Macrobid, ampicillin resistance.  Will treat with Cipro.  After IV fluids, patient is able to ambulate with steady gait.  She feels better and more energetic.  Family confirms that they do have a bedside toilet chair they can use to assist the patient while she recovers.  They are comfortable with picking her up and taking her home.  Doubt pyelonephritis.  Doubt ureteral/bladder obstruction.  Not septic.      ____________________________________________   FINAL CLINICAL IMPRESSION(S) / ED DIAGNOSES    Final diagnoses:  Cystitis  Dehydration     ED Discharge Orders          Ordered    ciprofloxacin (CIPRO) 500 MG tablet  2 times daily        04/16/21 0655    ondansetron (ZOFRAN-ODT) 4 MG disintegrating tablet  Every 8 hours PRN        04/16/21 0655            Portions of this note were generated with dragon dictation software. Dictation errors may occur despite best attempts at proofreading.    Carrie Mew, MD 04/16/21 [phone]

## 2021-04-18 LAB — URINE CULTURE: Culture: >=100000 — AB

## 2021-05-05 ENCOUNTER — Other Ambulatory Visit: Payer: Self-pay | Admitting: *Deleted

## 2021-05-05 DIAGNOSIS — D509 Iron deficiency anemia, unspecified: Secondary | ICD-10-CM

## 2021-05-10 NOTE — Progress Notes (Signed)
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  INTERVAL HISTORY: Patient returns to clinic today for routine 76-month evaluation.  She currently feels well and is asymptomatic.  She does not complain of any weakness or fatigue today.  She continues to tolerate letrozole well without significant side effects.  She has no neurologic complaints. She denies any recent fevers or illnesses.  She denies any chest pain, shortness of breath, cough, or hemoptysis.  She denies any nausea, vomiting, constipation, or diarrhea.  She denies any melena or hematochezia.  She has no urinary complaints.  Patient offers no specific complaints today.  REVIEW OF SYSTEMS:   Review of Systems  Constitutional: Negative.  Negative for fever and weight loss.  Respiratory: Negative.  Negative for cough and shortness of breath.   Cardiovascular: Negative.  Negative for chest pain and leg swelling.  Gastrointestinal: Negative.  Negative for abdominal pain, constipation, diarrhea and nausea.  Genitourinary: Negative.  Negative for dysuria and hematuria.  Musculoskeletal: Negative.  Negative for joint pain.  Skin: Negative.  Negative for rash.  Neurological: Negative.  Negative for sensory change, focal weakness, weakness and headaches.  Psychiatric/Behavioral: Negative.  The patient is not nervous/anxious.    As per HPI. Otherwise, a complete review of systems is negative.  PAST MEDICAL HISTORY: Past Medical History:  Diagnosis Date   Anxiety     Arthritis    CAD (coronary artery disease)    a. NSTEMI 12/19; b. LHC 04/10/18: pLAD 95%, mLAD 80%, m-dLCx 95%, OM3-1 lesion 40%, OM3-2 lesion 60%, mRCA 50%, EF 35-45%, successful PCI/DES x 2 to the LAD with recommended staged PCI of the LCx in a few weeks   Cancer (HCC)    Right Breast Cancer   Chronic systolic CHF (congestive heart failure) ([place])    a. TTE 12/19: EF 30-35%, anteroseptal, anterior, and apical HK, Gr1DD, mild to mod MR, mildly dilated LA, RVSF nl   COPD (chronic obstructive pulmonary disease) (HCC)    Depression    Dyspnea    with exertion   GERD (gastroesophageal reflux disease)    Hyperlipidemia    Hypertension    Personal history of chemotherapy 2018   chemo prior to lumpectomy of right breast   Personal history of radiation therapy 2019   right breast ca    PAST SURGICAL HISTORY: Past Surgical History:  Procedure Laterality Date   ABDOMINAL HYSTERECTOMY  1990   Partial   BREAST BIOPSY Right 10/04/2016   axilla lymph node (metastatic carcinoma) and axillay tail mass biopsy-invasive mammary carcinoma   BREAST LUMPECTOMY Right 03/21/2017   chemo first, [facility] and metastatic LN   BREAST LUMPECTOMY Right 05/21/2017   re-excision for clip   BREAST LUMPECTOMY WITH NEEDLE LOCALIZATION Right 03/21/2017   Procedure: BREAST LUMPECTOMY WITH NEEDLE LOCALIZATION;  Surgeon: Clayburn Pert, MD;  Location: [facility];  Service: General;  Laterality: Right;   CORONARY STENT INTERVENTION N/A 04/10/2018   Procedure: CORONARY STENT INTERVENTION;  Surgeon: Wellington Hampshire, MD;  Location: [place] [facility];  Service: Cardiovascular;  Laterality: N/A;   CORONARY  STENT INTERVENTION N/A 05/14/2018   Procedure: CORONARY STENT INTERVENTION;  Surgeon: Nelva Bush, MD;  Location: [place] [facility];  Service: Cardiovascular;  Laterality: N/A;   DILATION AND CURETTAGE OF UTERUS     INGUINAL HERNIA REPAIR Right 03/26/2017   Procedure: HERNIA REPAIR INGUINAL INCARCERATED;   Surgeon: Jules Husbands, MD;  Location: [facility];  Service: General;  Laterality: Right;   LEFT HEART CATH AND CORONARY ANGIOGRAPHY N/A 04/10/2018   Procedure: LEFT HEART CATH AND CORONARY ANGIOGRAPHY poss PCI;  Surgeon: Minna Merritts, MD;  Location: [place] [facility];  Service: Cardiovascular;  Laterality: N/A;   PORTA CATH REMOVAL N/A 12/06/2020   Procedure: PORTA CATH REMOVAL;  Surgeon: Algernon Huxley, MD;  Location: [place] [facility];  Service: Cardiovascular;  Laterality: N/A;   PORTACATH PLACEMENT Left 10/24/2016   Procedure: INSERTION PORT-A-CATH;  Surgeon: [name], MD;  Location: [facility];  Service: General;  Laterality: Left;   RE-EXCISION OF BREAST LUMPECTOMY Right 05/21/2017   Procedure: RE-EXCISION OF BREAST LUMPECTOMY;  Surgeon: Clayburn Pert, MD;  Location: [facility];  Service: General;  Laterality: Right;   SENTINEL NODE BIOPSY Right 03/21/2017   Procedure: SENTINEL NODE BIOPSY;  Surgeon: Clayburn Pert, MD;  Location: [facility];  Service: General;  Laterality: Right;    FAMILY HISTORY: Family History  Problem Relation Age of Onset   Colon cancer Mother    Breast cancer Neg Hx     ADVANCED DIRECTIVES (Y/N):  N  HEALTH MAINTENANCE: Social History   Tobacco Use   Smoking status: Former    Packs/day: 0.50    Types: Cigarettes    Quit date: 07/23/1988    Years since quitting: 32.8   Smokeless tobacco: Never  Vaping Use   Vaping Use: Never used  Substance Use Topics   Alcohol use: Yes    Comment: rare   Drug use: No     Colonoscopy:  PAP:  Bone density:  Lipid panel:  Allergies  Allergen Reactions   Nsaids     Bleeding risk   Penicillins Anaphylaxis, Swelling and Rash    Has patient had a PCN reaction causing immediate rash, facial/tongue/throat swelling, SOB or lightheadedness with hypotension: Yes Has patient had a PCN reaction causing severe rash involving mucus membranes or skin necrosis: No Has patient had a PCN reaction that required  hospitalization: No  Has patient had a PCN reaction occurring within the last 10 years: No If all of the above answers are "NO", then may proceed with Cephalosporin use.    Tamsulosin Nausea Only   Atorvastatin Other (See Comments)    Muscle Pain   Gabapentin Swelling   Ezetimibe Other (See Comments)    Muscle pain   Aspirin     GI bleeding risk    Current Outpatient Medications  Medication Sig Dispense Refill   albuterol (PROVENTIL HFA;VENTOLIN HFA) 108 (90 Base) MCG/ACT inhaler Inhale 2 puffs into the lungs every 6 (six) hours as needed for wheezing or shortness of breath.     baclofen (LIORESAL) 10 MG tablet Take 10 mg by mouth at bedtime.  9   carvedilol (COREG) 3.125 MG tablet Take 2 tablets (6.25 mg total) by mouth 2 (two) times daily. 90 tablet 3   cholecalciferol (VITAMIN D3) 25 MCG (1000 UT) tablet Take 1,000 Units by mouth daily.     ciprofloxacin (CIPRO) 500 MG tablet Take 1 tablet (500 mg total) by mouth 2 (two) times daily. 14 tablet 0   clopidogrel (PLAVIX)  75 MG tablet TAKE 1 TABLET (75 MG TOTAL) BY MOUTH DAILY WITH BREAKFAST. 90 tablet 3   Coenzyme Q10 300 MG CAPS Take 300 mg by mouth daily.      DULoxetine (CYMBALTA) 30 MG capsule Take 30 mg by mouth daily. (Take with $RemoveBe'60mg'WjrzCsyYc$  capsule to equal $Remove'90mg'zZtvbfG$ )  11   DULoxetine (CYMBALTA) 60 MG capsule Take 60 mg by mouth daily. (Take with $RemoveBe'30mg'xGMtMzwOe$  capsule to equal $Remove'90mg'vLMJNaH$ )     enalapril (VASOTEC) 5 MG tablet TAKE 1 TABLET (5 MG TOTAL) BY MOUTH DAILY. 90 tablet 3   feeding supplement (ENSURE ENLIVE / ENSURE PLUS) LIQD Take 237 mLs by mouth daily.     fexofenadine (ALLEGRA) 180 MG tablet Take 180 mg by mouth daily. (Patient not taking: Reported on 12/06/2020)     fluticasone (FLONASE) 50 MCG/ACT nasal spray Place 2 sprays into the nose 2 (two) times daily as needed for allergies or rhinitis. (Patient not taking: Reported on 12/06/2020)     fluticasone furoate-vilanterol (BREO ELLIPTA) 200-25 MCG/INH AEPB Inhale 1 puff into the lungs daily.      furosemide (LASIX) 20 MG tablet Take 1 tablet (20 mg total) by mouth daily as needed for edema. 30 tablet 11   guaiFENesin-dextromethorphan (ROBITUSSIN DM) 100-10 MG/5ML syrup Take 5 mLs by mouth every 4 (four) hours as needed for cough. 118 mL 0   letrozole (FEMARA) 2.5 MG tablet Take 1 tablet (2.5 mg total) by mouth daily. 90 tablet 1   lidocaine-prilocaine (EMLA) cream Apply 1 application as needed topically (for port access).     Magnesium Oxide 250 MG TABS Take 250 mg by mouth daily.     Melatonin 10 MG TABS Take 10 mg by mouth at bedtime as needed (sleep).     montelukast (SINGULAIR) 10 MG tablet Take 10 mg by mouth at bedtime.      ondansetron (ZOFRAN-ODT) 4 MG disintegrating tablet Take 1 tablet (4 mg total) by mouth every 8 (eight) hours as needed for nausea or vomiting. 20 tablet 0   pantoprazole (PROTONIX) 40 MG tablet TAKE 1 TABLET BY MOUTH EVERY DAY 90 tablet 1   rosuvastatin (CRESTOR) 20 MG tablet TAKE 1 TABLET BY MOUTH EVERY DAY AT 6PM 90 tablet 3   spironolactone (ALDACTONE) 25 MG tablet Take 0.5 tablets (12.5 mg total) by mouth daily. 45 tablet 3   tiotropium (SPIRIVA) 18 MCG inhalation capsule Place 18 mcg into inhaler and inhale daily.     No current facility-administered medications for this visit.   Facility-Administered Medications Ordered in Other Visits  Medication Dose Route Frequency Provider Last Rate Last Admin   heparin lock flush 100 unit/mL  500 Units Intravenous Once Lloyd Huger, MD       heparin lock flush 100 unit/mL  500 Units Intravenous Once Lloyd Huger, MD       ondansetron [facility]) 8 mg in sodium chloride 0.9 % 50 mL IVPB   Intravenous Once Lloyd Huger, MD        OBJECTIVE: Vitals:   05/12/21 1333  BP: (!) 139/51  Pulse: 81  Temp: 97.8 F (36.6 C)     Body mass index is 25.81 kg/m.    ECOG FS:0 - Asymptomatic  General: Well-developed, well-nourished, no acute distress. Eyes: Pink conjunctiva, anicteric sclera. HEENT:  Normocephalic, moist mucous membranes. Breasts: Exam deferred today. Lungs: No audible wheezing or coughing. Heart: Regular rate and rhythm. Abdomen: Soft, nontender, no obvious distention. Musculoskeletal: No edema, cyanosis, or clubbing. Neuro: Alert, answering  all questions appropriately. Cranial nerves grossly intact. Skin: No rashes or petechiae noted. Psych: Normal affect.   LAB RESULTS:  Lab Results  Component Value Date   NA 137 05/12/2021   K 4.2 05/12/2021   CL 103 05/12/2021   CO2 27 05/12/2021   GLUCOSE 87 05/12/2021   BUN 33 (H) 05/12/2021   CREATININE 1.41 (H) 05/12/2021   CALCIUM 8.8 (L) 05/12/2021   PROT 7.8 05/12/2021   ALBUMIN 3.9 05/12/2021   AST 20 05/12/2021   ALT 9 05/12/2021   ALKPHOS 60 05/12/2021   BILITOT 0.4 05/12/2021   GFRNONAA 38 (L) 05/12/2021   GFRAA 39 (L) 12/01/2019    Lab Results  Component Value Date   WBC 13.6 (H) 05/12/2021   NEUTROABS 9.4 (H) 05/12/2021   HGB 12.6 05/12/2021   HCT 41.6 05/12/2021   MCV 84.2 05/12/2021   PLT 295 05/12/2021   Lab Results  Component Value Date   IRON 42 11/08/2020   TIBC 384 11/08/2020   IRONPCTSAT 11 11/08/2020   Lab Results  Component Value Date   FERRITIN 17 11/08/2020     STUDIES: DG Chest Portable 1 View  Result Date: 04/16/2021 CLINICAL DATA:  Cough, weakness EXAM: PORTABLE CHEST 1 VIEW COMPARISON:  11/13/2020 FINDINGS: Heart is normal size. Aortic atherosclerosis. Bullous emphysema. No confluent airspace opacities. Scarring in the right mid lung. No effusions or acute bony abnormality. IMPRESSION: Bullous emphysema.  Chronic changes.  No active disease. Electronically Signed   By: Rolm Baptise M.D.   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  PLAN:    1. 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  Treatment was discontinued secondary to declining performance status.  Patient underwent lumpectomy on March 21, 2017, but no biopsy clip was seen in her surgical specimen.  She had repeat surgery on May 21, 2017 to remove her retained clip, which confirmed a complete pathological response. Patient completed her adjuvant XRT on September 27, 2017.  Continue letrozole for total 5 years completing treatment in February 2024.  Patient's most recent mammogram was a unilateral right mammogram on July 02, 2020.  Repeat bilateral mammogram in March 2023.  Return to clinic in 6 months for routine evaluation.  3. Hypokalemia: Resolved.  Continue 20 mEq oral potassium supplementation daily. 4.  Iron deficiency anemia: Resolved.  Iron stores are pending at time of dictation.  Patient last received IV Venofer on May 04, 2020.    5.  Bone health: Bone mineral density on January 01, 2020 reported T score of -0.8 which is considered normal.  Continue calcium and vitamin D supplementation.  Repeat in September 2023.   6.  Cardiac disease: Patient now has 3 stents placed.  Continue follow-up and treatment per cardiology.   Patient expressed understanding and was in agreement with this plan. She also understands that She can call clinic at any time with any questions, concerns, or complaints.    Cancer Staging  Malignant neoplasm of right female breast [facility]) Staging form: Breast, AJCC 8th Edition - Clinical stage from 10/16/2016: Stage IIB (cT2, cN1, cM0, G3, ER-, PR+, HER2+) - Signed by Lloyd Huger, MD on 10/16/2016 Histologic grading system: 3 grade system Laterality: Right   Lloyd Huger, MD  05/12/2021 1:58 PM

## 2021-05-12 ENCOUNTER — Encounter: Payer: Self-pay | Admitting: Oncology

## 2021-05-12 ENCOUNTER — Inpatient Hospital Stay (HOSPITAL_BASED_OUTPATIENT_CLINIC_OR_DEPARTMENT_OTHER): Payer: Medicare Other | Admitting: Oncology

## 2021-05-12 ENCOUNTER — Other Ambulatory Visit: Payer: Self-pay

## 2021-05-12 ENCOUNTER — Inpatient Hospital Stay: Payer: Medicare Other | Attending: Oncology

## 2021-05-12 ENCOUNTER — Inpatient Hospital Stay: Payer: Medicare Other

## 2021-05-12 VITALS — BP 139/51 | HR 81 | Temp 97.8°F | Wt 141.1 lb

## 2021-05-12 DIAGNOSIS — Z853 Personal history of malignant neoplasm of breast: Secondary | ICD-10-CM | POA: Insufficient documentation

## 2021-05-12 DIAGNOSIS — D509 Iron deficiency anemia, unspecified: Secondary | ICD-10-CM | POA: Diagnosis not present

## 2021-05-12 DIAGNOSIS — Z171 Estrogen receptor negative status [ER-]: Secondary | ICD-10-CM | POA: Diagnosis not present

## 2021-05-12 DIAGNOSIS — C50411 Malignant neoplasm of upper-outer quadrant of right female breast: Secondary | ICD-10-CM | POA: Diagnosis not present

## 2021-05-12 LAB — CBC WITH DIFFERENTIAL/PLATELET
Abs Immature Granulocytes: 0.06 10*3/uL (ref 0.00–0.07)
Basophils Absolute: 0.1 10*3/uL (ref 0.0–0.1)
Basophils Relative: 1 %
Eosinophils Absolute: 0.5 10*3/uL (ref 0.0–0.5)
Eosinophils Relative: 4 %
HCT: 41.6 % (ref 36.0–46.0)
Hemoglobin: 12.6 g/dL (ref 12.0–15.0)
Immature Granulocytes: 0 %
Lymphocytes Relative: 14 %
Lymphs Abs: 2 10*3/uL (ref 0.7–4.0)
MCH: 25.5 pg — ABNORMAL LOW (ref 26.0–34.0)
MCHC: 30.3 g/dL (ref 30.0–36.0)
MCV: 84.2 fL (ref 80.0–100.0)
Monocytes Absolute: 1.6 10*3/uL — ABNORMAL HIGH (ref 0.1–1.0)
Monocytes Relative: 12 %
Neutro Abs: 9.4 10*3/uL — ABNORMAL HIGH (ref 1.7–7.7)
Neutrophils Relative %: 69 %
Platelets: 295 10*3/uL (ref 150–400)
RBC: 4.94 MIL/uL (ref 3.87–5.11)
RDW: 18.1 % — ABNORMAL HIGH (ref 11.5–15.5)
WBC: 13.6 10*3/uL — ABNORMAL HIGH (ref 4.0–10.5)
nRBC: 0 % (ref 0.0–0.2)

## 2021-05-12 LAB — COMPREHENSIVE METABOLIC PANEL
ALT: 9 U/L (ref 0–44)
AST: 20 U/L (ref 15–41)
Albumin: 3.9 g/dL (ref 3.5–5.0)
Alkaline Phosphatase: 60 U/L (ref 38–126)
Anion gap: 7 (ref 5–15)
BUN: 33 mg/dL — ABNORMAL HIGH (ref 8–23)
CO2: 27 mmol/L (ref 22–32)
Calcium: 8.8 mg/dL — ABNORMAL LOW (ref 8.9–10.3)
Chloride: 103 mmol/L (ref 98–111)
Creatinine, Ser: 1.41 mg/dL — ABNORMAL HIGH (ref 0.44–1.00)
GFR, Estimated: 38 mL/min — ABNORMAL LOW (ref >60)
Glucose, Bld: 87 mg/dL (ref 70–99)
Potassium: 4.2 mmol/L (ref 3.5–5.1)
Sodium: 137 mmol/L (ref 135–145)
Total Bilirubin: 0.4 mg/dL (ref 0.3–1.2)
Total Protein: 7.8 g/dL (ref 6.5–8.1)

## 2021-05-12 LAB — IRON AND TIBC
Iron: 41 ug/dL (ref 28–170)
Saturation Ratios: 10 % — ABNORMAL LOW (ref 10.4–31.8)
TIBC: 412 ug/dL (ref 250–450)
UIBC: 371 ug/dL

## 2021-05-12 LAB — FERRITIN: Ferritin: 15 ng/mL (ref 11–307)

## 2021-06-02 ENCOUNTER — Ambulatory Visit
Admission: RE | Admit: 2021-06-02 | Discharge: 2021-06-02 | Disposition: A | Discharge status: Home / Self Care | Payer: Medicare Other | Source: Ambulatory Visit | Attending: Radiation Oncology | Admitting: Radiation Oncology

## 2021-06-02 ENCOUNTER — Other Ambulatory Visit: Payer: Self-pay

## 2021-06-02 VITALS — Temp 97.4°F | Resp 18 | Wt 141.4 lb

## 2021-06-02 DIAGNOSIS — Z86000 Personal history of in-situ neoplasm of breast: Secondary | ICD-10-CM | POA: Insufficient documentation

## 2021-06-02 DIAGNOSIS — Z171 Estrogen receptor negative status [ER-]: Secondary | ICD-10-CM

## 2021-06-02 DIAGNOSIS — C50411 Malignant neoplasm of upper-outer quadrant of right female breast: Secondary | ICD-10-CM

## 2021-06-02 DIAGNOSIS — Z923 Personal history of irradiation: Secondary | ICD-10-CM | POA: Diagnosis not present

## 2021-06-02 NOTE — Progress Notes (Signed)
Radiation Oncology Follow up Note  Name: Krista Cisneros   Date:   06/02/2021 MRN:  [id number] DOB: [birth year]    This 81 y.o. female presents to the clinic today for 3-1/2-year follow-up status post whole breast radiation to her right breast for stage IIb ER negative PR positive HER2/neu positive invasive mammary carcinoma.  REFERRING PROVIDER: Sharyne Peach, MD  HPI: Patient is a 81 year old female now out close to 4 years having completed whole breast radiation to her right breast for stage IIb invasive mammary carcinoma seen today in routine follow-up she is doing well specifically denies breast tenderness cough or bone pain..  She had mammograms back in March which were BI-RADS 3 probably benign.  She had some likely benign calcifications within the high upper outer quadrant of the right breast with 35-month follow-up scans recommended.  She is currently on Femara tolerating it well without side effect.  COMPLICATIONS OF TREATMENT: none  FOLLOW UP COMPLIANCE: keeps appointments   PHYSICAL EXAM:  Temp (!) 97.4 F (36.3 C)    Resp 18    Wt 141 lb 6.4 oz (64.1 kg)    BMI 25.86 kg/m  Lungs are clear to A&P cardiac examination essentially unremarkable with regular rate and rhythm. No dominant mass or nodularity is noted in either breast in 2 positions examined. Incision is well-healed. No axillary or supraclavicular adenopathy is appreciated. Cosmetic result is excellent.  Well-developed well-nourished patient in NAD. HEENT reveals PERLA, EOMI, discs not visualized.  Oral cavity is clear. No oral mucosal lesions are identified. Neck is clear without evidence of cervical or supraclavicular adenopathy. Lungs are clear to A&P. Cardiac examination is essentially unremarkable with regular rate and rhythm without murmur rub or thrill. Abdomen is benign with no organomegaly or masses noted. Motor sensory and DTR levels are equal and symmetric in the upper and lower extremities. Cranial nerves II  through XII are grossly intact. Proprioception is intact. No peripheral adenopathy or edema is identified. No motor or sensory levels are noted. Crude visual fields are within normal range.  RADIOLOGY RESULTS: Mammograms reviewed compatible with above-stated findings  PLAN: This time patient will continue follow-up care with medical oncology.  She will follow-up mammograms again this March.  I am going to discontinue follow-up care based on her age and mobility and that she is close to 4 years out.  Patient is to call at anytime with any concerns.  I would like to take this opportunity to thank you for allowing me to participate in the care of your patient.Noreene Filbert, MD

## 2021-07-04 ENCOUNTER — Ambulatory Visit
Admission: RE | Admit: 2021-07-04 | Discharge: 2021-07-04 | Disposition: A | Discharge status: Home / Self Care | Payer: Medicare Other | Source: Ambulatory Visit | Attending: Oncology | Admitting: Oncology

## 2021-07-04 ENCOUNTER — Other Ambulatory Visit: Payer: Self-pay

## 2021-07-04 DIAGNOSIS — Z1231 Encounter for screening mammogram for malignant neoplasm of breast: Secondary | ICD-10-CM | POA: Insufficient documentation

## 2021-07-04 DIAGNOSIS — R921 Mammographic calcification found on diagnostic imaging of breast: Secondary | ICD-10-CM | POA: Diagnosis present

## 2021-07-04 DIAGNOSIS — R928 Other abnormal and inconclusive findings on diagnostic imaging of breast: Secondary | ICD-10-CM | POA: Insufficient documentation

## 2021-07-04 IMAGING — MG DIGITAL DIAGNOSTIC BILAT W/ TOMO W/ CAD
8 of 14 series · 8 of 34 positions shown · non-contrast
Comparison: Previous exam(s).

CLINICAL DATA: 81-year-old female presenting for annual bilateral
mammogram and 1 year follow-up of probably benign right breast
calcifications.

EXAM:
DIGITAL DIAGNOSTIC BILATERAL MAMMOGRAM WITH TOMOSYNTHESIS AND CAD
TECHNIQUE: Bilateral digital diagnostic mammography and breast tomosynthesis
was performed. The images were evaluated with computer-aided
detection.

[R MLO]
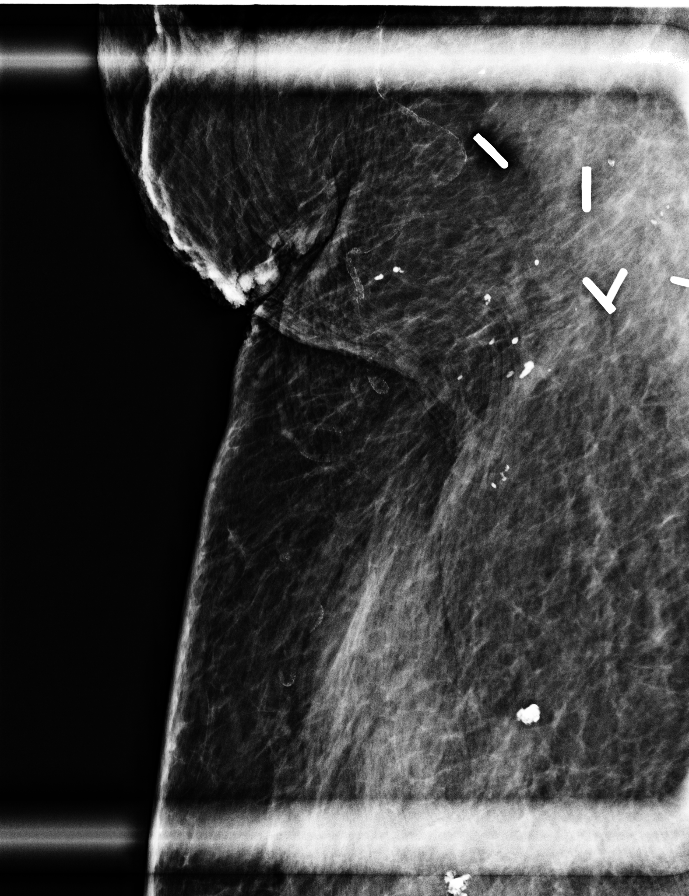

[R XCCL (1 of 2)]
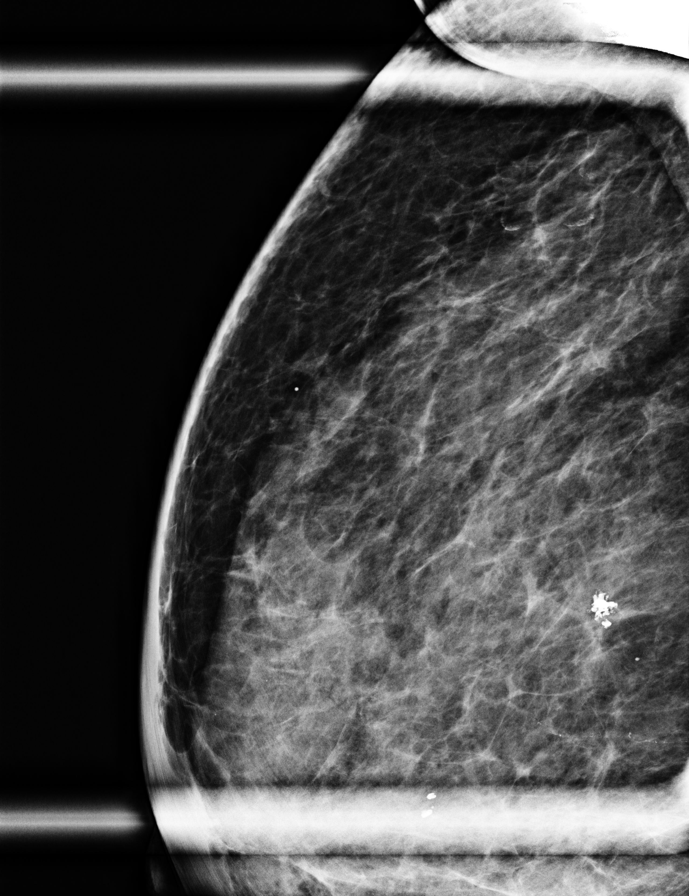

[R ML]
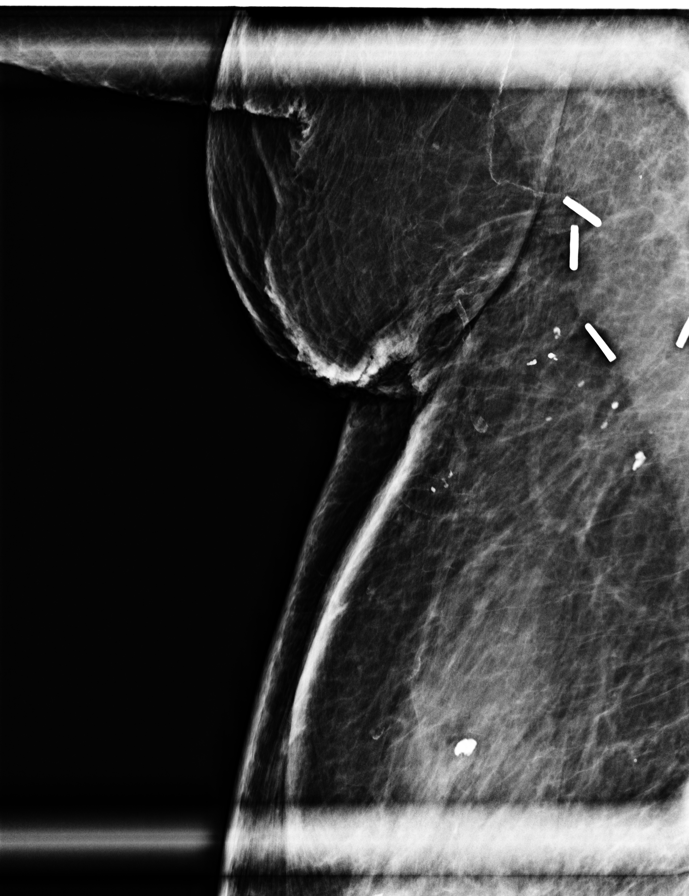

[R XCCL (2 of 2)]
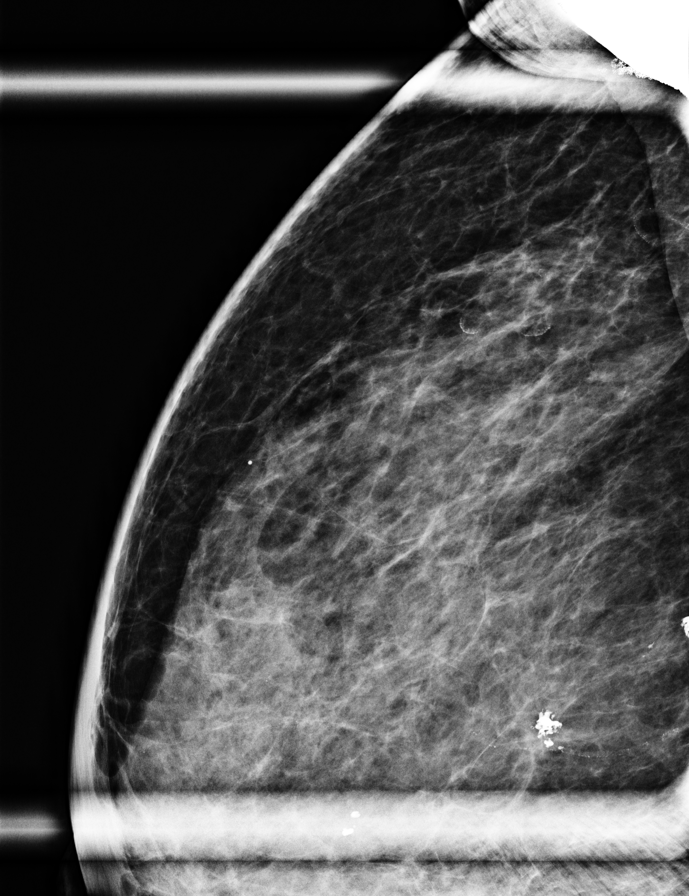

[L CC synth-2D]
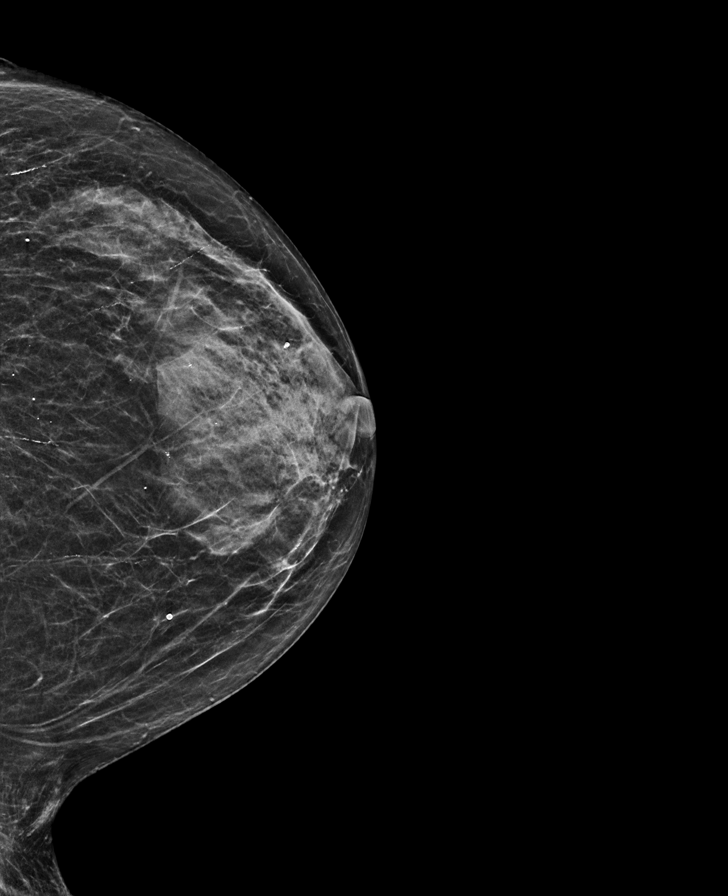

[R MLO synth-2D (1 of 2)]
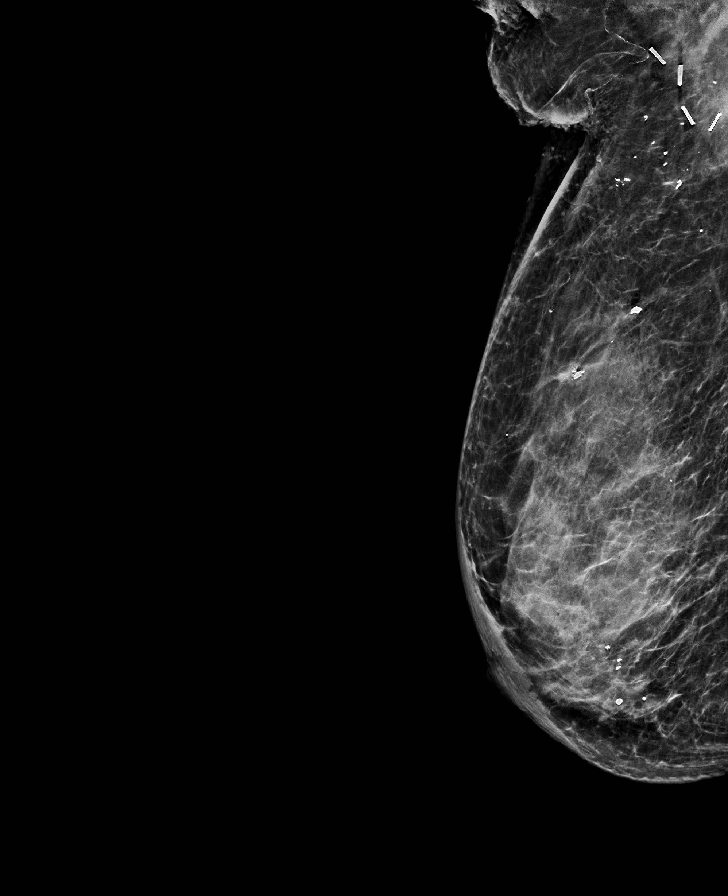

[R MLO synth-2D (2 of 2)]
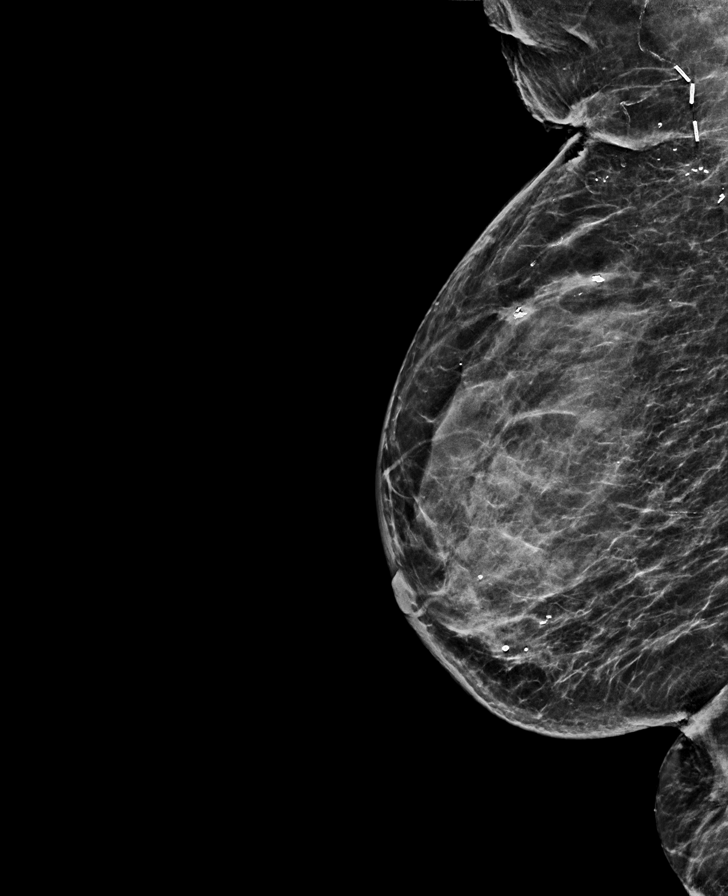

[R CC synth-2D]
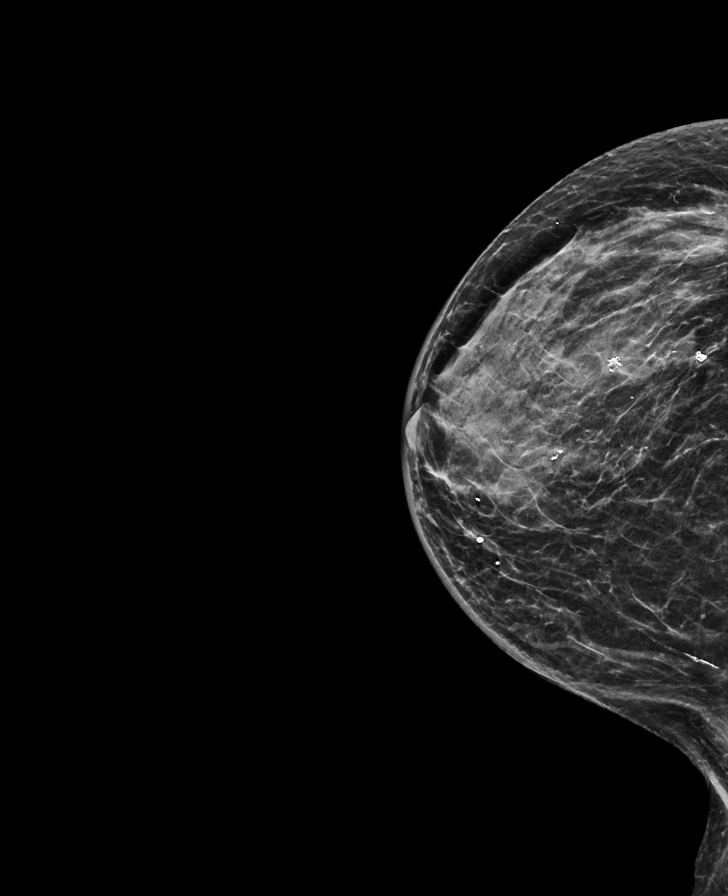

[8 of 34 positions shown; findings below may reference images not displayed]

ACR Breast Density Category c: The breast tissue is heterogeneously
dense, which may obscure small masses.
FINDINGS: Scattered calcifications in the low right axilla demonstrate an
increasingly coarse appearance. Otherwise, no new or suspicious
findings in either breast. Stable right breast posttreatment
changes.
IMPRESSION: 1. Probably benign right breast calcifications, most suggestive of
evolving fat necrosis. Recommend a final follow-up in 1 year.
2. No mammographic evidence of malignancy on the left.

RECOMMENDATION:
Bilateral diagnostic mammogram in 1 year.

I have discussed the findings and recommendations with the patient.
If applicable, a reminder letter will be sent to the patient
regarding the next appointment.

BI-RADS CATEGORY  3: Probably benign.

## 2021-07-17 ENCOUNTER — Other Ambulatory Visit: Payer: Self-pay | Admitting: Cardiovascular Disease

## 2021-07-17 ENCOUNTER — Other Ambulatory Visit: Payer: Self-pay | Admitting: Oncology

## 2021-08-25 ENCOUNTER — Emergency Department: Payer: Medicare Other

## 2021-08-25 ENCOUNTER — Other Ambulatory Visit: Payer: Self-pay

## 2021-08-25 DIAGNOSIS — Z8616 Personal history of COVID-19: Secondary | ICD-10-CM | POA: Diagnosis not present

## 2021-08-25 DIAGNOSIS — Z853 Personal history of malignant neoplasm of breast: Secondary | ICD-10-CM | POA: Diagnosis not present

## 2021-08-25 DIAGNOSIS — J189 Pneumonia, unspecified organism: Secondary | ICD-10-CM | POA: Diagnosis not present

## 2021-08-25 DIAGNOSIS — R8289 Other abnormal findings on cytological and histological examination of urine: Secondary | ICD-10-CM | POA: Insufficient documentation

## 2021-08-25 DIAGNOSIS — N1831 Chronic kidney disease, stage 3a: Secondary | ICD-10-CM | POA: Insufficient documentation

## 2021-08-25 DIAGNOSIS — J449 Chronic obstructive pulmonary disease, unspecified: Secondary | ICD-10-CM | POA: Diagnosis not present

## 2021-08-25 DIAGNOSIS — I130 Hypertensive heart and chronic kidney disease with heart failure and stage 1 through stage 4 chronic kidney disease, or unspecified chronic kidney disease: Secondary | ICD-10-CM | POA: Diagnosis not present

## 2021-08-25 DIAGNOSIS — I5022 Chronic systolic (congestive) heart failure: Secondary | ICD-10-CM | POA: Insufficient documentation

## 2021-08-25 DIAGNOSIS — I2510 Atherosclerotic heart disease of native coronary artery without angina pectoris: Secondary | ICD-10-CM | POA: Insufficient documentation

## 2021-08-25 DIAGNOSIS — D72829 Elevated white blood cell count, unspecified: Secondary | ICD-10-CM | POA: Diagnosis not present

## 2021-08-25 DIAGNOSIS — R471 Dysarthria and anarthria: Secondary | ICD-10-CM | POA: Diagnosis not present

## 2021-08-25 LAB — CBC
HCT: 40 % (ref 36.0–46.0)
Hemoglobin: 11.6 g/dL — ABNORMAL LOW (ref 12.0–15.0)
MCH: 25 pg — ABNORMAL LOW (ref 26.0–34.0)
MCHC: 29 g/dL — ABNORMAL LOW (ref 30.0–36.0)
MCV: 86.2 fL (ref 80.0–100.0)
Platelets: 588 10*3/uL — ABNORMAL HIGH (ref 150–400)
RBC: 4.64 MIL/uL (ref 3.87–5.11)
RDW: 18.3 % — ABNORMAL HIGH (ref 11.5–15.5)
WBC: 12.4 10*3/uL — ABNORMAL HIGH (ref 4.0–10.5)
nRBC: 0 % (ref 0.0–0.2)

## 2021-08-25 LAB — URINALYSIS, ROUTINE W REFLEX MICROSCOPIC
Bilirubin Urine: NEGATIVE
Glucose, UA: NEGATIVE mg/dL
Hgb urine dipstick: NEGATIVE
Ketones, ur: NEGATIVE mg/dL
Nitrite: NEGATIVE
Protein, ur: NEGATIVE mg/dL
Specific Gravity, Urine: 1.017 (ref 1.005–1.030)
pH: 5 (ref 5.0–8.0)

## 2021-08-25 LAB — BASIC METABOLIC PANEL
Anion gap: 10 (ref 5–15)
BUN: 39 mg/dL — ABNORMAL HIGH (ref 8–23)
CO2: 24 mmol/L (ref 22–32)
Calcium: 9 mg/dL (ref 8.9–10.3)
Chloride: 107 mmol/L (ref 98–111)
Creatinine, Ser: 1.76 mg/dL — ABNORMAL HIGH (ref 0.44–1.00)
GFR, Estimated: 29 mL/min — ABNORMAL LOW (ref >60)
Glucose, Bld: 162 mg/dL — ABNORMAL HIGH (ref 70–99)
Potassium: 4.4 mmol/L (ref 3.5–5.1)
Sodium: 141 mmol/L (ref 135–145)

## 2021-08-25 IMAGING — CT CT HEAD W/O CM
4 series · 17 of 47 positions shown, 19 images · non-contrast
Comparison: CT brain [DATE]

CLINICAL DATA: Difficulty finding words decreased sensation to
right arm



[Series 2: head bone · axial · 0.42mm/px · z∈[-120,-46]mm · 5 of 79 slices shown]
[im 8/79  bone]
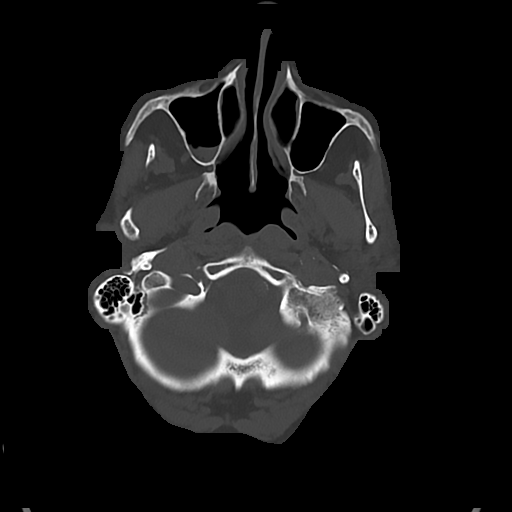
[im 15/79  bone]
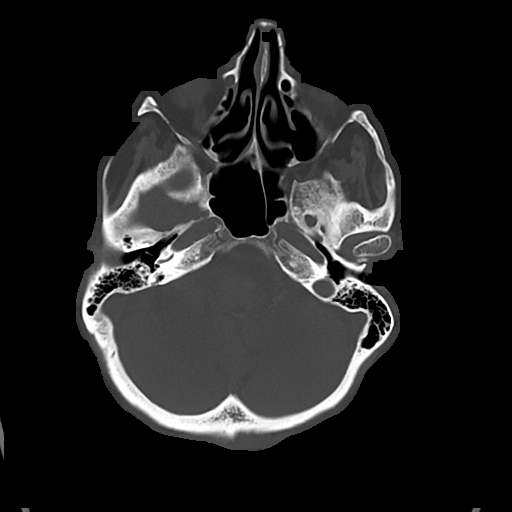
[im 27/79  bone]
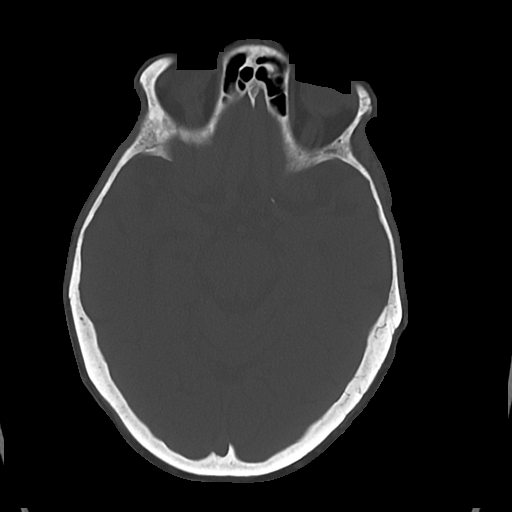
[im 34/79  bone]
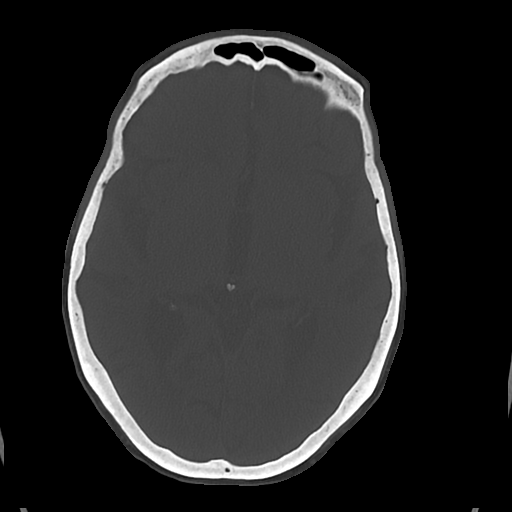
[im 45/79  bone]
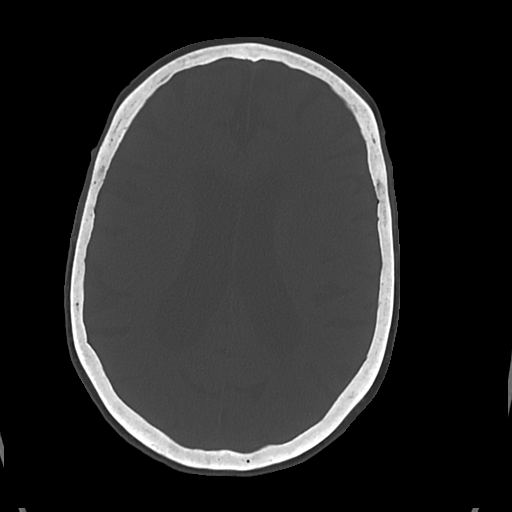

[Series 3: head wo · axial · 0.42mm/px · z∈[-114,-14]mm · 6 of 30 slices shown, 8 images]
[im 5/30  brain]
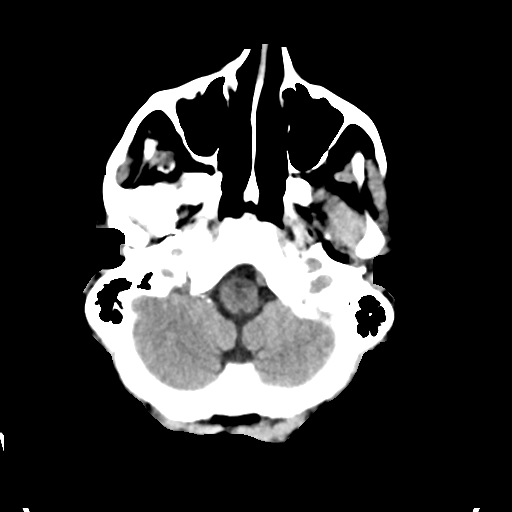
[im 5/30  bone]
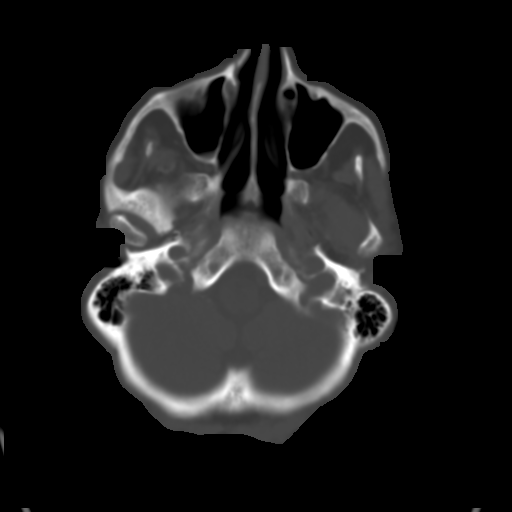
[im 9/30  brain]
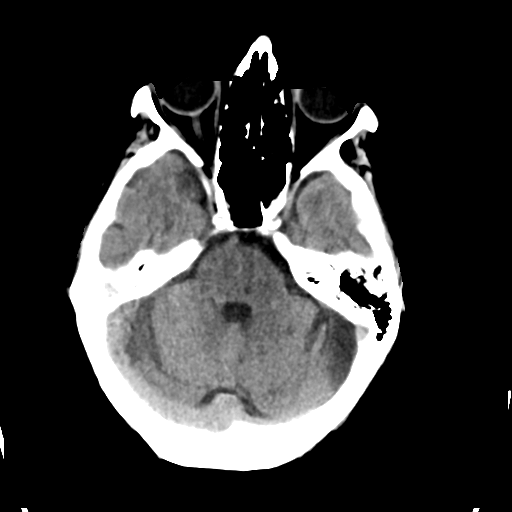
[im 13/30  brain]
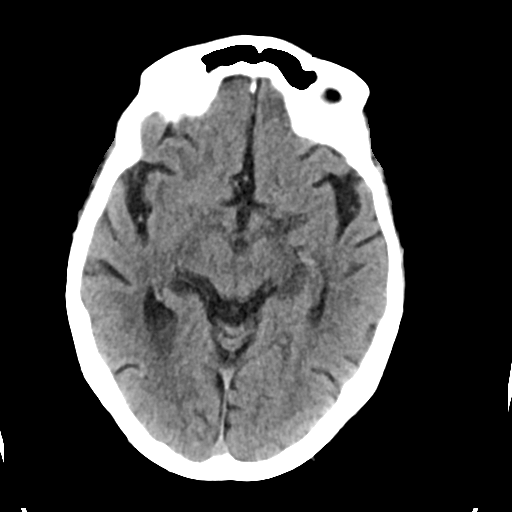
[im 17/30  brain]
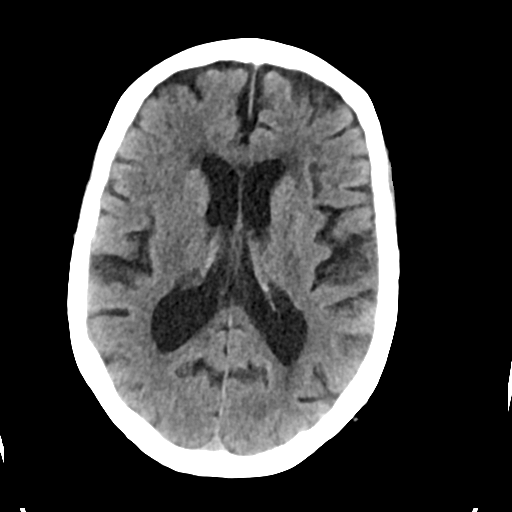
[im 21/30  brain]
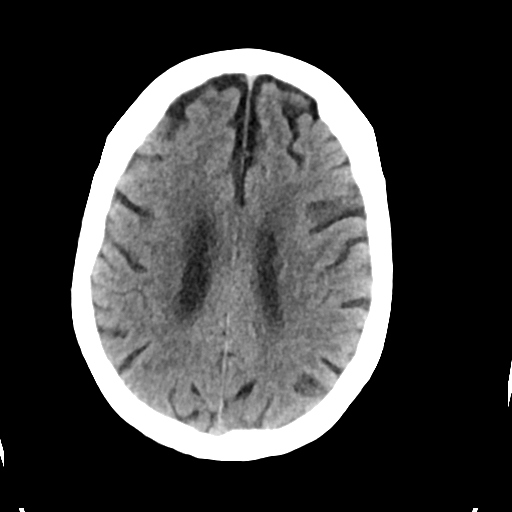
[im 21/30  bone]
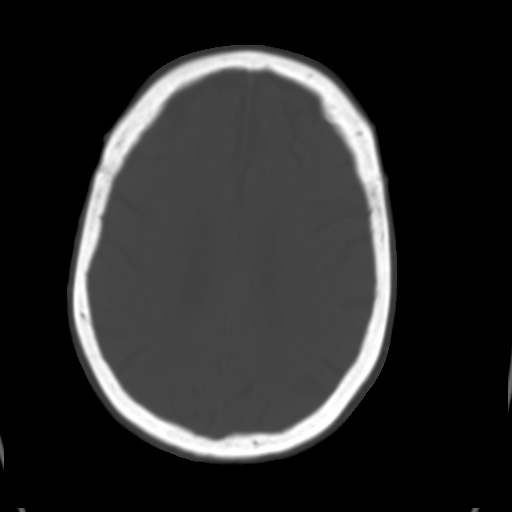
[im 25/30  brain]
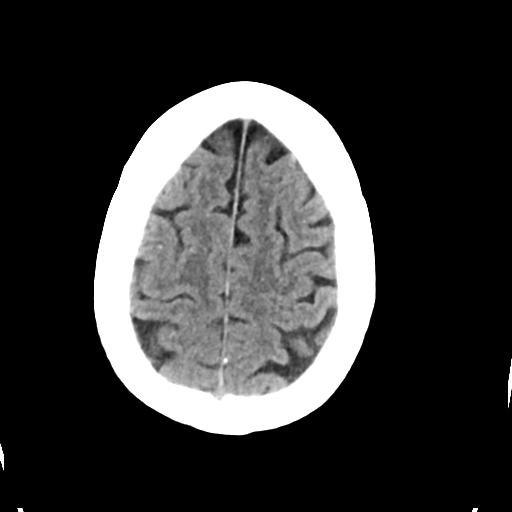

[Series 4: cor soft · coronal · 0.31mm/px · 3 of 67 slices shown]
[im 23/67  brain]
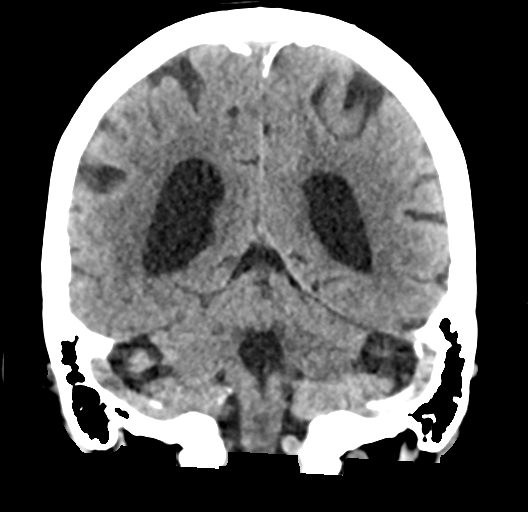
[im 30/67  brain]
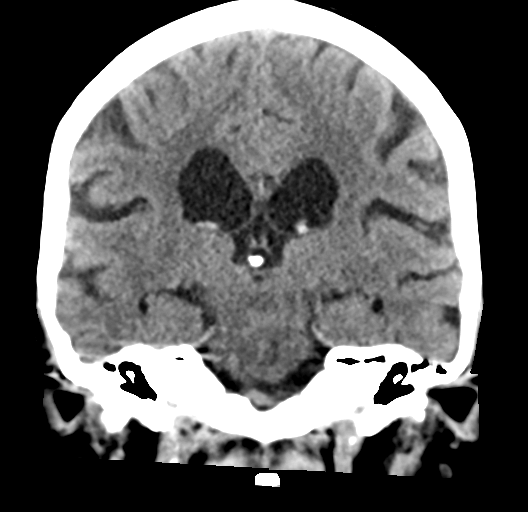
[im 37/67  brain]
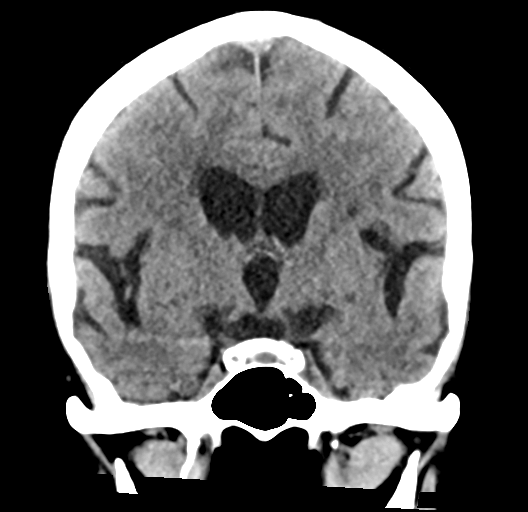

[Series 5: sag soft · sagittal · 0.31mm/px · 3 of 55 slices shown]
[im 19/55  brain]
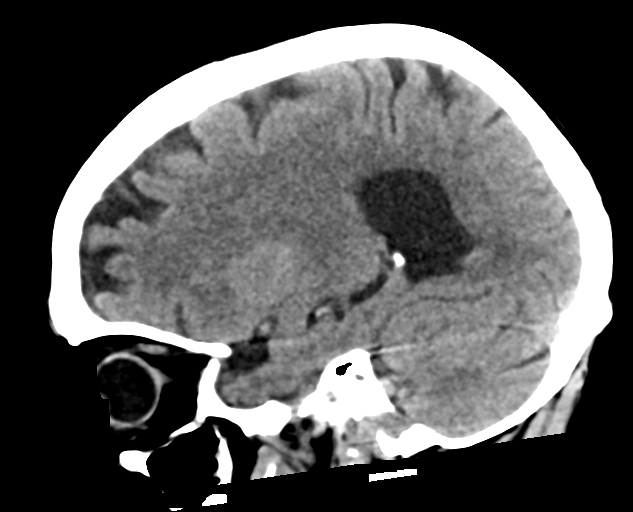
[im 28/55  brain]
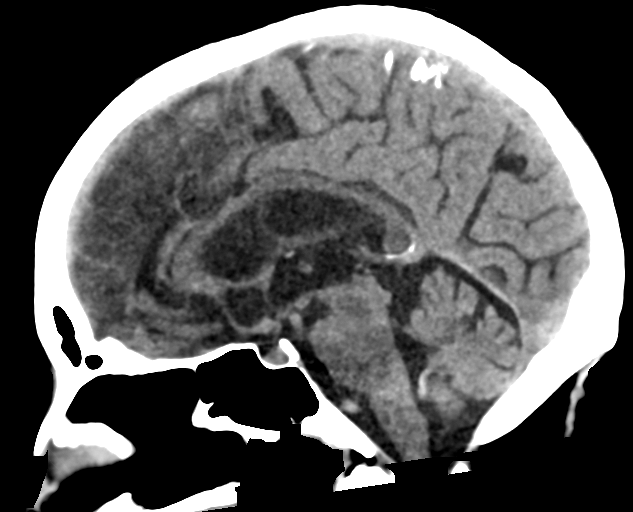
[im 37/55  brain]
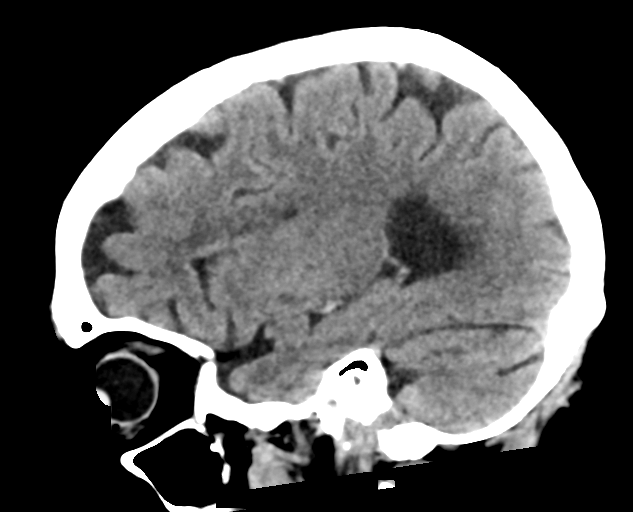

[17 of 47 positions shown; findings below may reference images not displayed]

FINDINGS: Brain: No acute territorial infarction, hemorrhage or intracranial
mass. Small chronic cerebellar infarcts. Mild atrophy and chronic
small vessel ischemic changes of the white matter. Nonenlarged
ventricles.

Vascular: No hyperdense vessels.  Carotid vascular calcification

Skull: Normal. Negative for fracture or focal lesion.

Sinuses/Orbits: Mucosal thickening and fluid in the right maxillary
sinus

Other: None
IMPRESSION: 1. No CT evidence for acute intracranial abnormality.
2. Atrophy and mild chronic small vessel ischemic changes of the
white matter

## 2021-08-25 NOTE — ED Triage Notes (Signed)
Presents to ED for difficulty "finding words" since Monday, worsening since. Last seen normal Sunday per daughter. Reports decreased sensations to (R)arm. 4 falls 1.5 weeks ago. On plavix.  ?

## 2021-08-26 ENCOUNTER — Emergency Department
Admission: EM | Admit: 2021-08-26 | Discharge: 2021-08-26 | Disposition: A | Discharge status: Home / Self Care | Payer: Medicare Other | Attending: Emergency Medicine | Admitting: Emergency Medicine

## 2021-08-26 ENCOUNTER — Emergency Department: Payer: Medicare Other

## 2021-08-26 DIAGNOSIS — R479 Unspecified speech disturbances: Secondary | ICD-10-CM

## 2021-08-26 DIAGNOSIS — J189 Pneumonia, unspecified organism: Secondary | ICD-10-CM

## 2021-08-26 LAB — CBG MONITORING, ED: Glucose-Capillary: 71 mg/dL (ref 70–99)

## 2021-08-26 IMAGING — MR MR HEAD W/O CM
12 series · 47 of 48 positions shown · non-contrast
Comparison: None Available.

CLINICAL DATA: Word-finding difficulty

EXAM:
MRI HEAD WITHOUT CONTRAST
TECHNIQUE: Multiplanar, multiecho pulse sequences of the brain and surrounding
structures were obtained without intravenous contrast.

[Series 5: ax dwi_tracew · axial · 3.0mm · 0.65mm/px · z∈[-91,+63]mm · 5 of 48 slices shown]
[im 1/48]
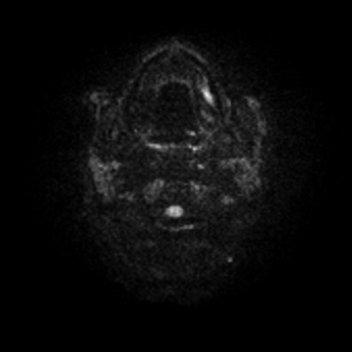
[im 12/48]
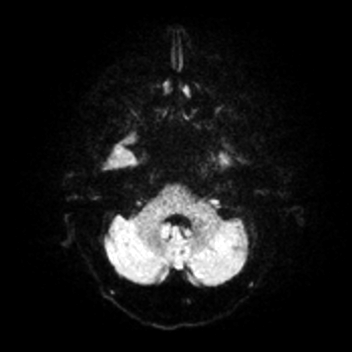
[im 24/48]
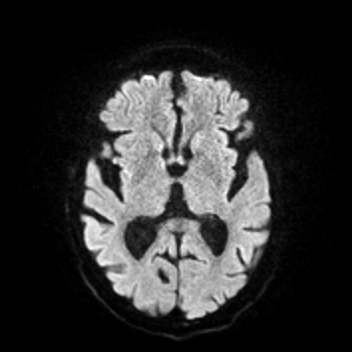
[im 36/48]
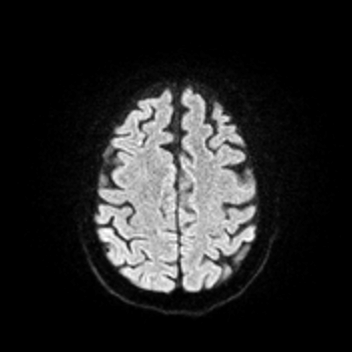
[im 48/48]
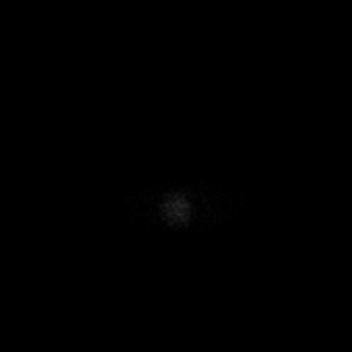

[Series 6: ax dwi_adc · axial · 3.0mm · 0.65mm/px · z∈[-91,+60]mm · 4 of 47 slices shown]
[im 1/47]
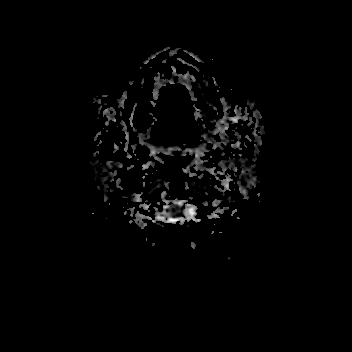
[im 16/47]
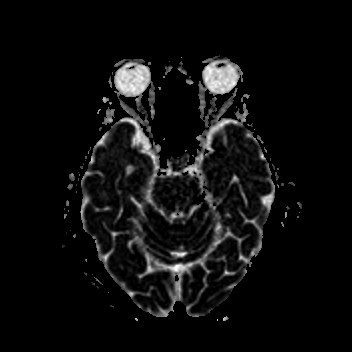
[im 31/47]
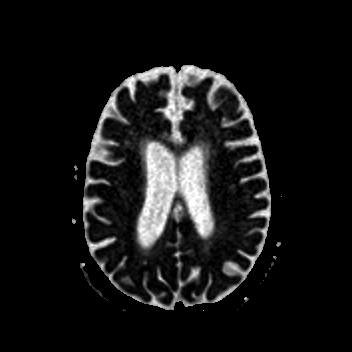
[im 47/47]
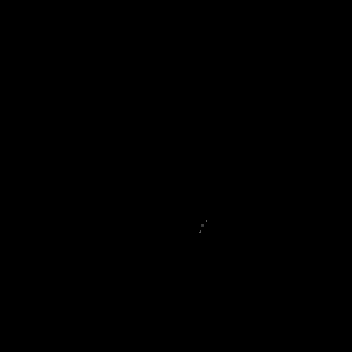

[Series 7: cor dwi_tracew · coronal · 5.0mm · 0.65mm/px · 3 of 40 slices shown]
[im 1/40]
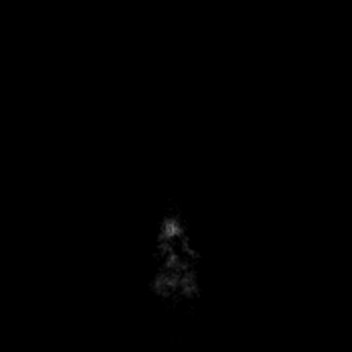
[im 20/40]
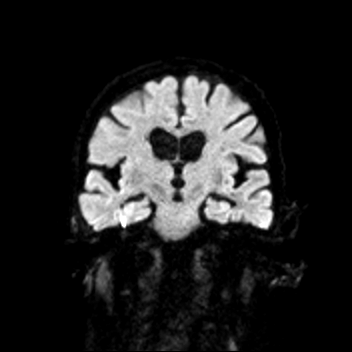
[im 40/40]
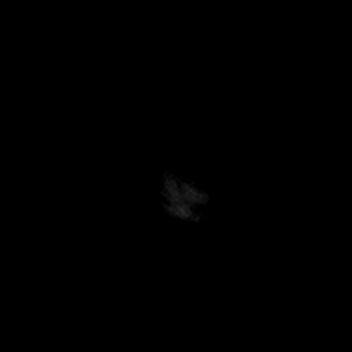

[Series 8: cor dwi_adc · coronal · 5.0mm · 0.65mm/px · 3 of 39 slices shown]
[im 1/39]
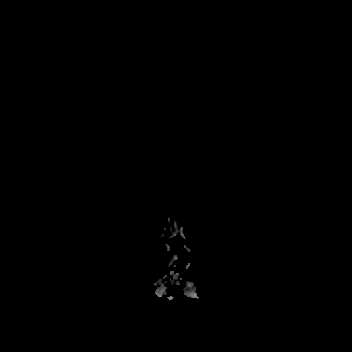
[im 20/39]
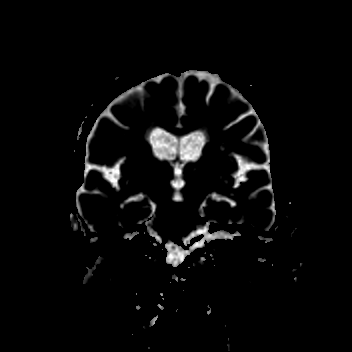
[im 39/39]
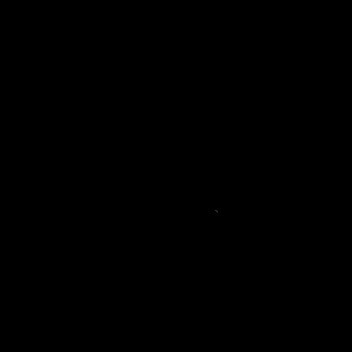

[Series 9: T1 · sagittal · 5.0mm · 0.62mm/px · 2 of 20 slices shown (1 of 3)]
[im 1/20]
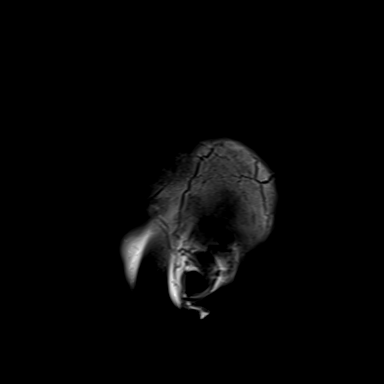
[im 20/20]
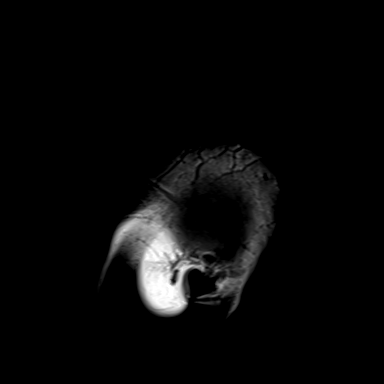

[Series 10: T2 · axial · 5.0mm · 0.53mm/px · z∈[-81,+62]mm · 2 of 25 slices shown (1 of 2)]
[im 1/25]
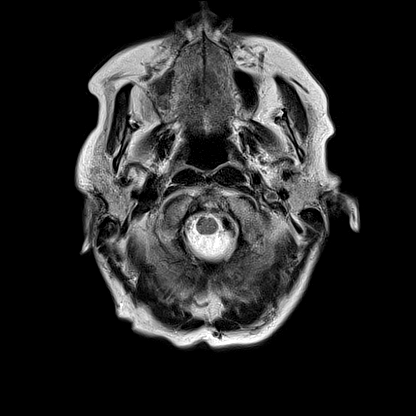
[im 25/25]
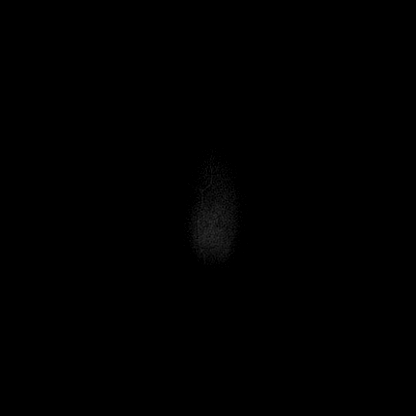

[Series 12: pha_images · axial · 3.0mm · 0.90mm/px · z∈[-86,+66]mm · 4 of 52 slices shown]
[im 1/52]
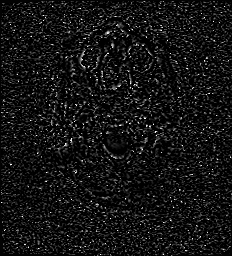
[im 18/52]
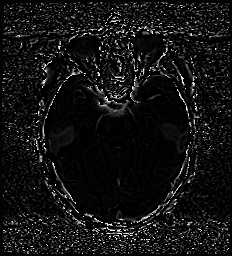
[im 35/52]
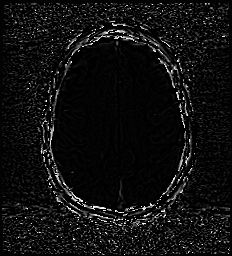
[im 52/52]
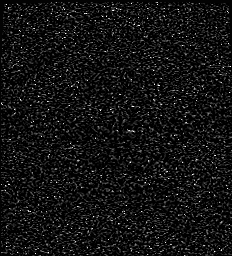

[Series 13: swi_images · axial · 3.0mm · 0.90mm/px · z∈[-86,+66]mm · 4 of 52 slices shown]
[im 1/52]
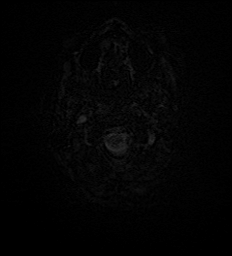
[im 18/52]
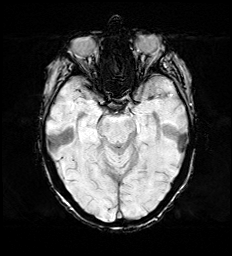
[im 35/52]
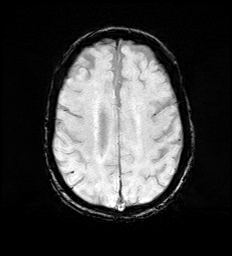
[im 52/52]
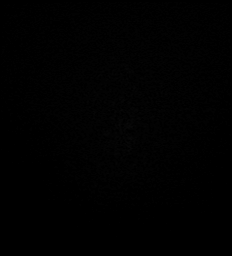

[Series 15: FLAIR · axial · 3.0mm · 0.53mm/px · z∈[-90,+71]mm · 4 of 55 slices shown]
[im 1/55]
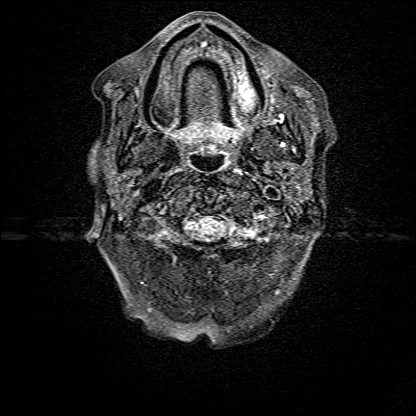
[im 19/55]
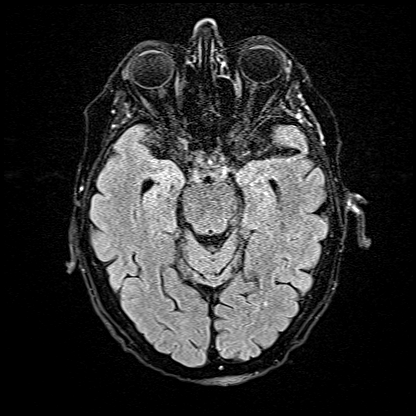
[im 37/55]
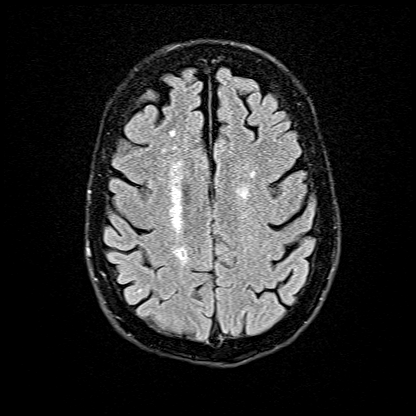
[im 55/55]
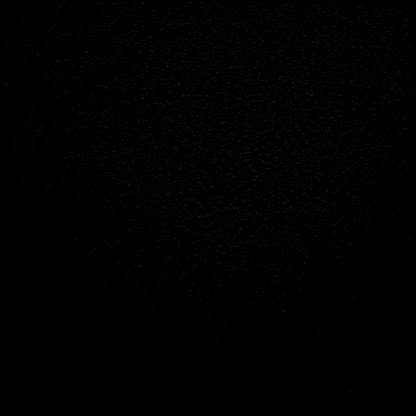

[Series 16: T1 · axial · 1.0mm · 0.98mm/px · z∈[-90,+68]mm · 12 of 160 slices shown (2 of 3)]
[im 1/160]
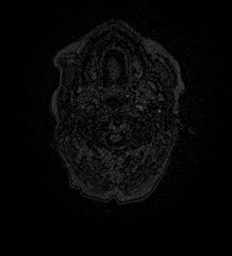
[im 14/160]
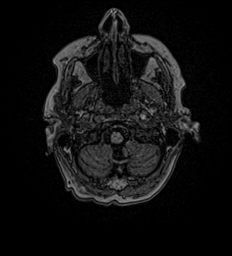
[im 27/160]
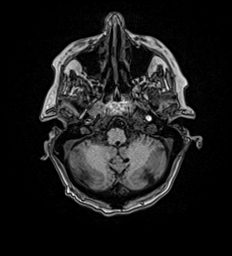
[im 40/160]
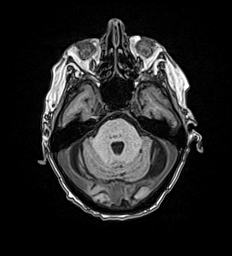
[im 54/160]
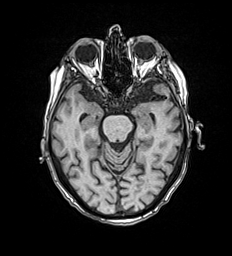
[im 67/160]
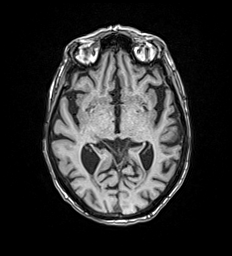
[im 80/160]
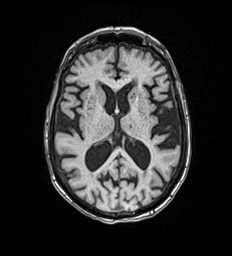
[im 93/160]
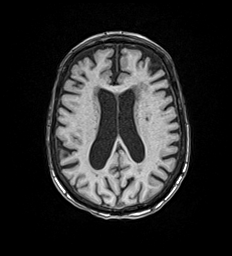
[im 107/160]
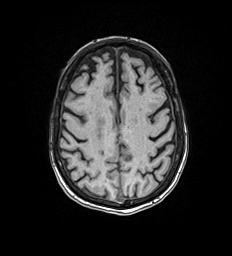
[im 120/160]
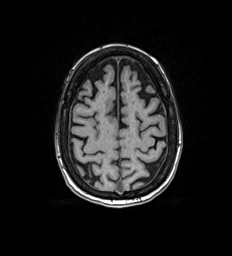
[im 133/160]
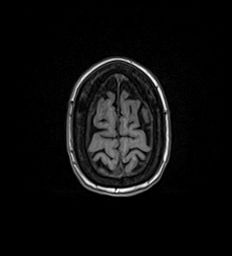
[im 160/160]
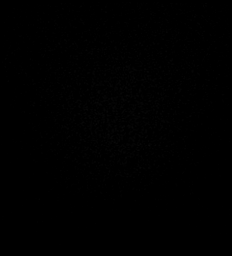

[Series 17: T2 · coronal · 5.0mm · 0.69mm/px · 2 of 29 slices shown (2 of 2)]
[im 1/29]
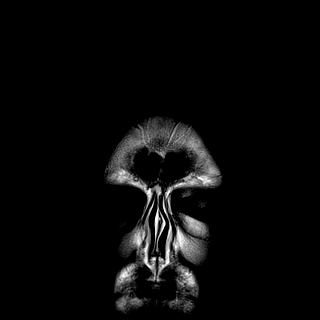
[im 29/29]
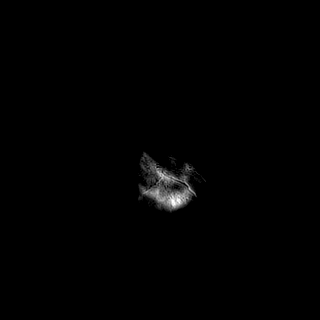

[Series 18: T1 · sagittal · 5.0mm · 0.75mm/px · 2 of 20 slices shown (3 of 3)]
[im 1/20]
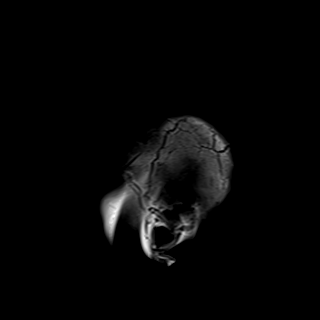
[im 20/20]
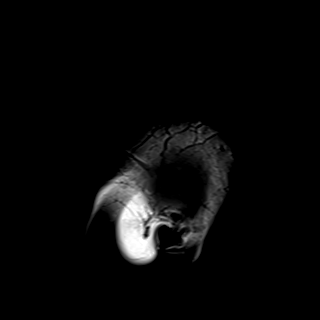

[47 of 48 positions shown; findings below may reference images not displayed]

FINDINGS: Brain: No acute infarct, mass effect or extra-axial collection. No
acute or chronic hemorrhage. There is multifocal hyperintense
T2-weighted signal within the white matter. Generalized cerebral
volume loss. There are multiple old cerebellar infarcts. The midline
structures are normal.

Vascular: Major flow voids are preserved.

Skull and upper cervical spine: Normal calvarium and skull base.
Visualized upper cervical spine and soft tissues are normal.

Sinuses/Orbits:Mild right maxillary sinus mucosal thickening. Normal
orbits.
IMPRESSION: 1. No acute intracranial abnormality.
2. Multiple old cerebellar infarcts and findings of chronic small
vessel disease.

## 2021-08-26 IMAGING — CR DG CHEST 2V
2 series · 2 of 2 positions shown · non-contrast
Comparison: [DATE]

CLINICAL DATA: Shortness of breath for several days

EXAM:
CHEST - 2 VIEW

[chest pa]
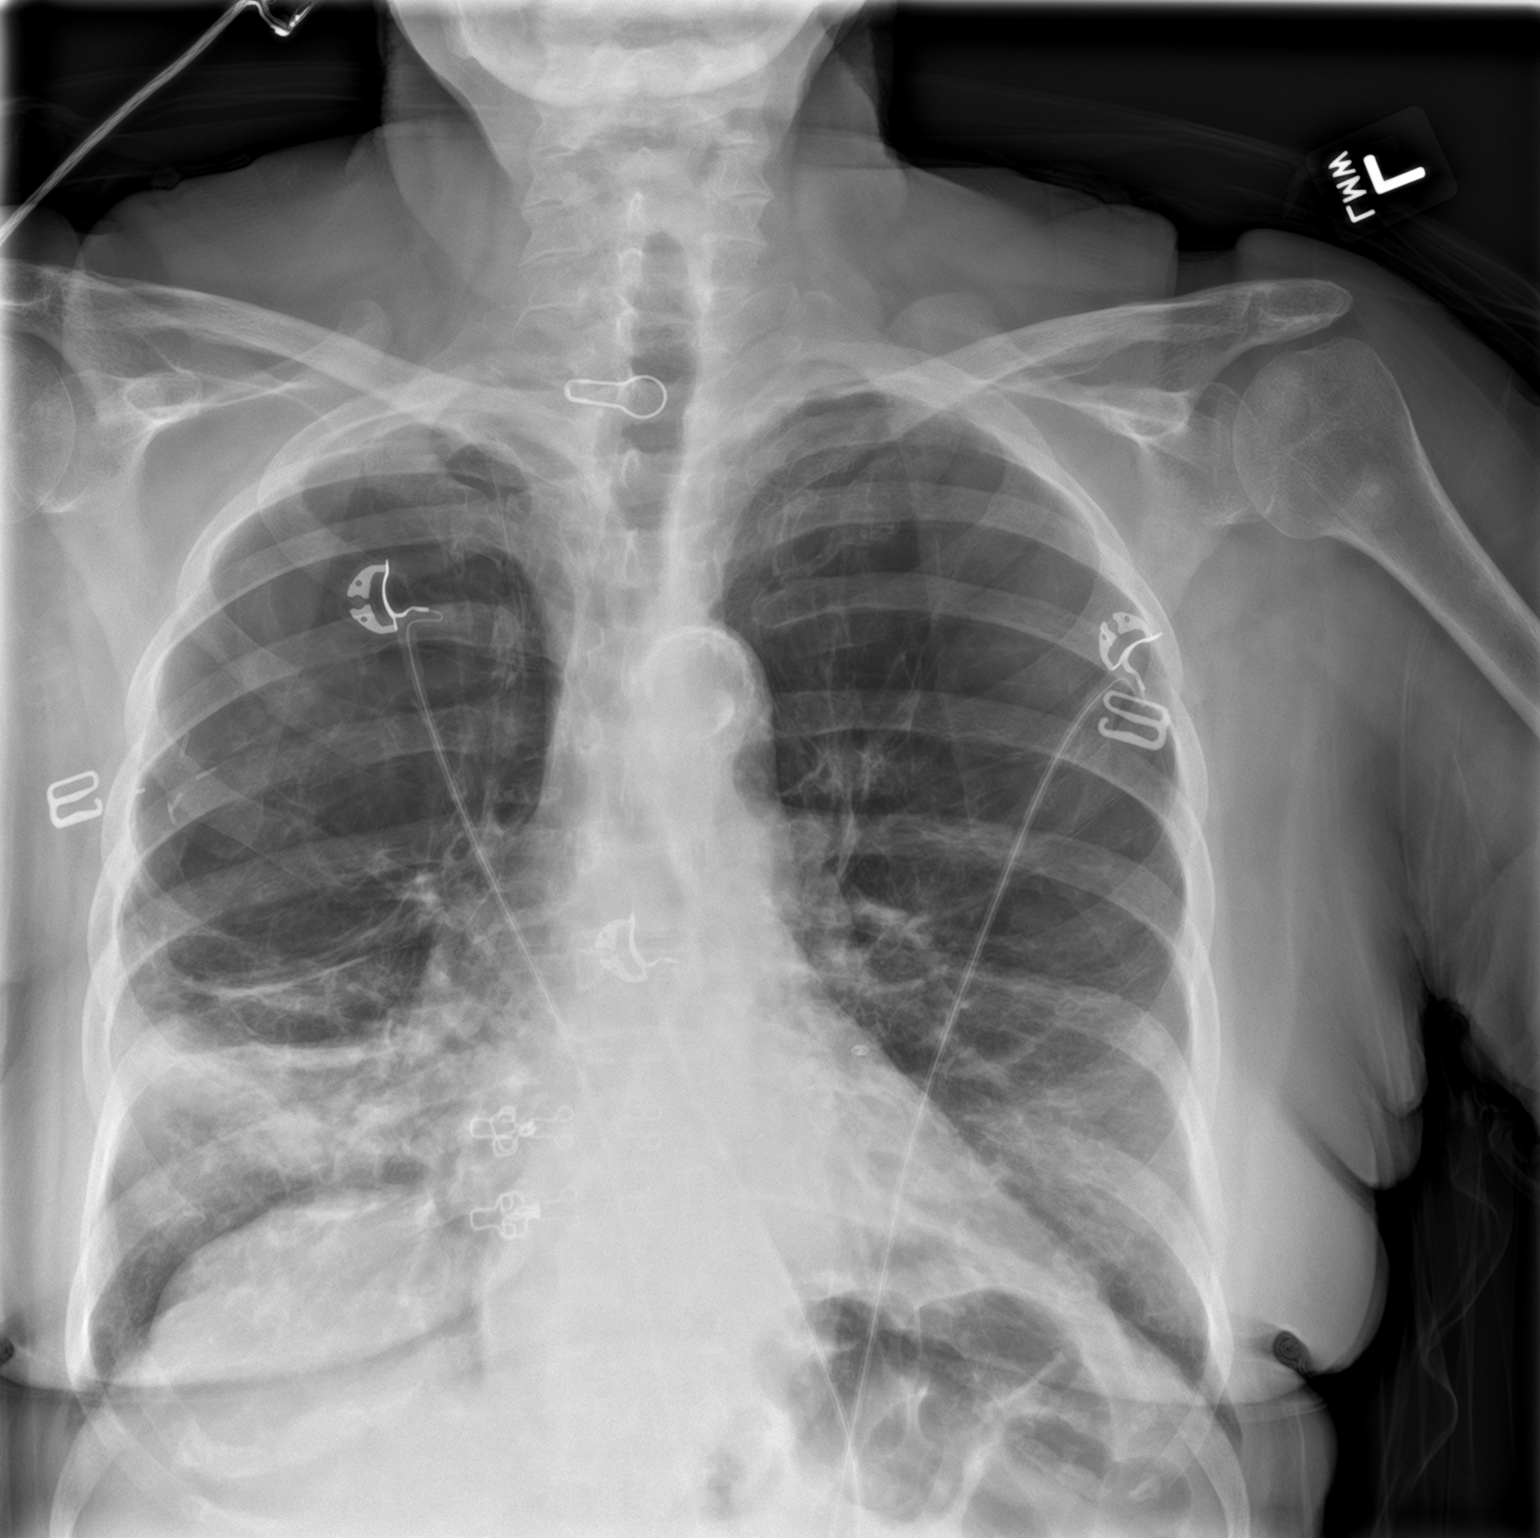

[chest lat]
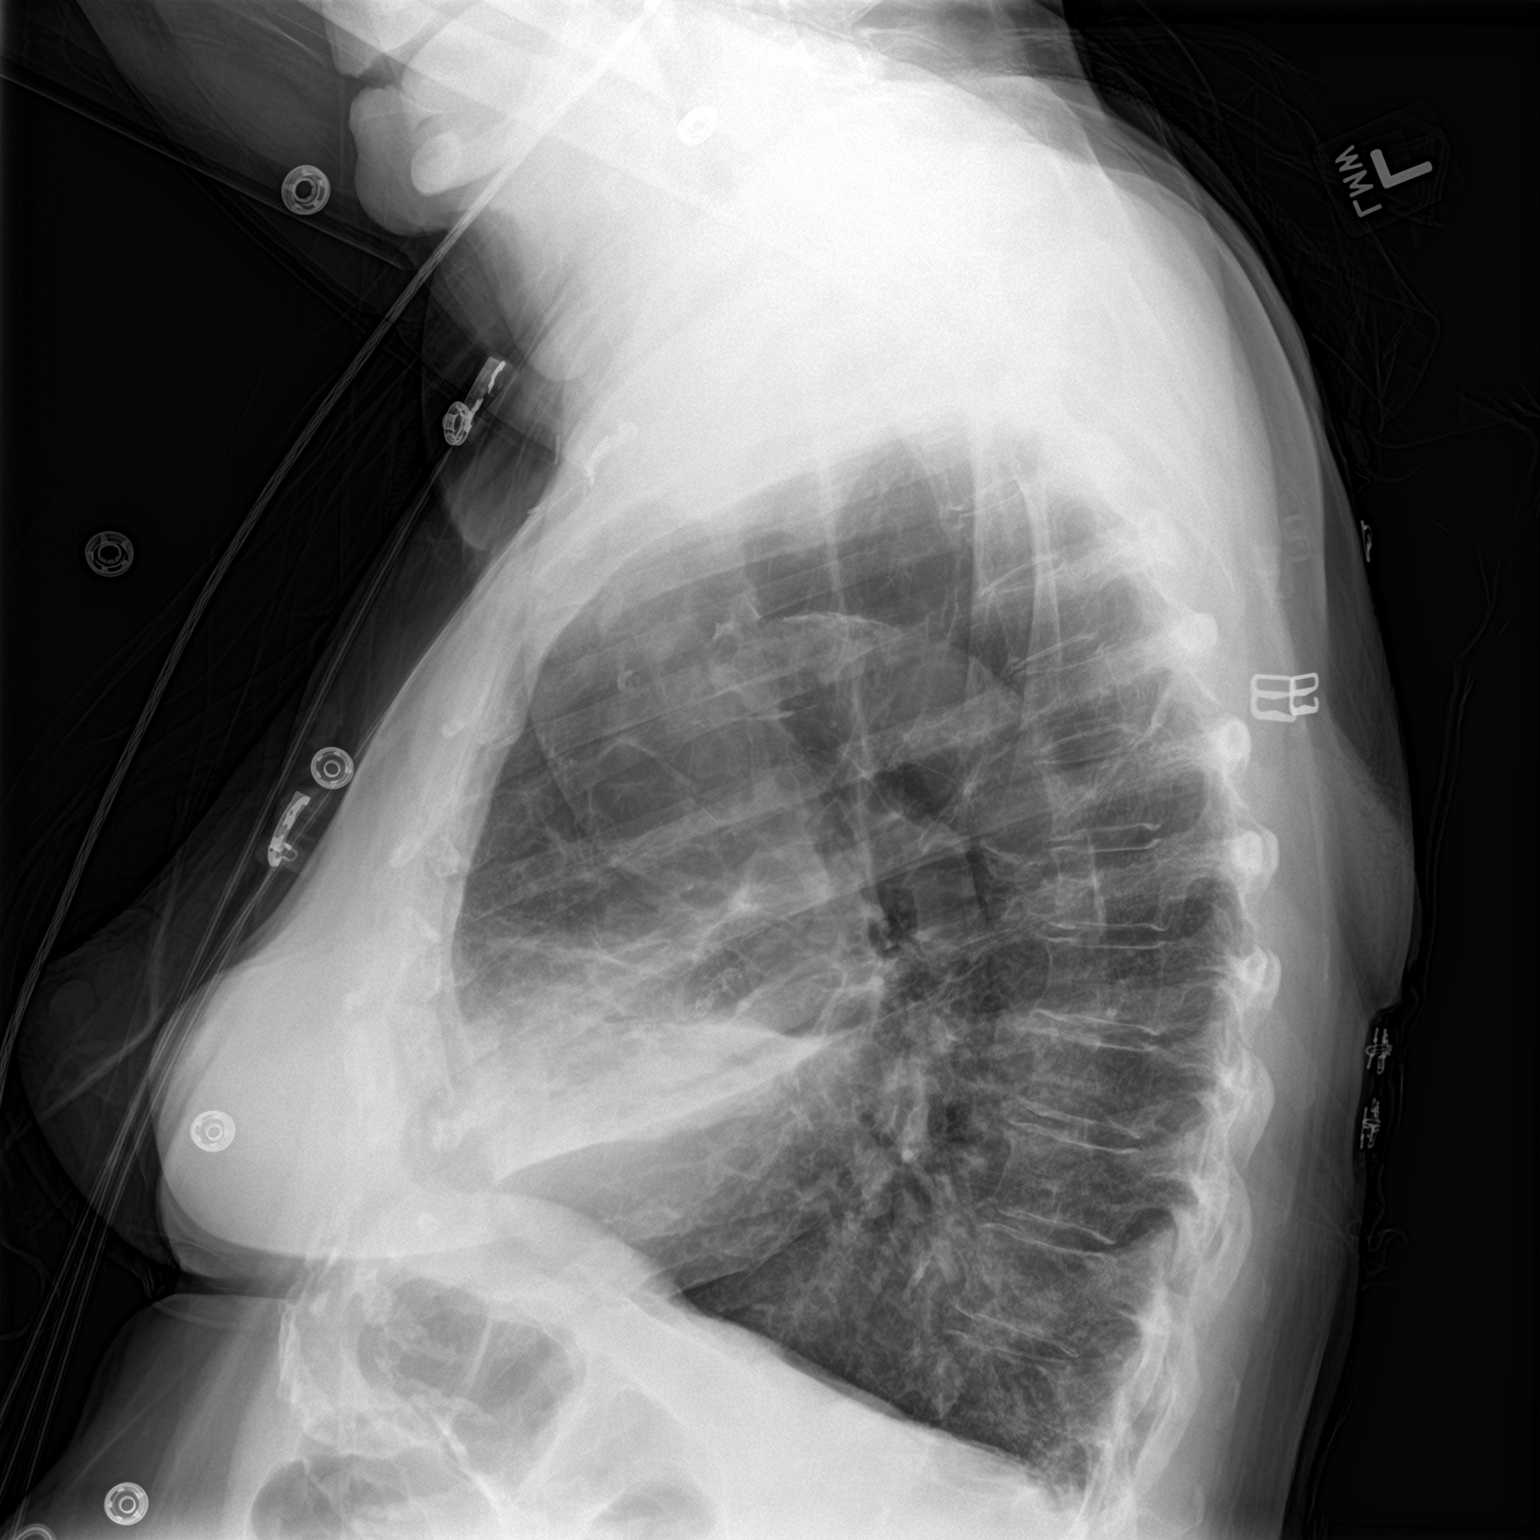

[2 of 2 positions shown; findings below may reference images not displayed]

FINDINGS: Cardiac shadow is stable. Aortic calcifications are again seen.
Significant emphysematous changes are noted in the apices with mild
scarring in the right mid lung. Patchy airspace opacities are noted
in the bases bilaterally worse on the right than the left consistent
with acute on chronic infiltrate.
IMPRESSION: Acute on chronic infiltrate in the bases worse on the right than the
left.

## 2021-08-26 MED ORDER — LEVOFLOXACIN 750 MG PO TABS
750.0000 mg | ORAL_TABLET | Freq: Once | ORAL | Status: AC
  Administered 2021-08-26: 750 mg via ORAL
  Filled 2021-08-26: qty 1

## 2021-08-26 MED ORDER — LEVOFLOXACIN 750 MG PO TABS: 750.0000 mg | ORAL_TABLET | Freq: Every day | ORAL | 0 refills | Status: AC

## 2021-08-26 NOTE — ED Provider Notes (Signed)
? ?[name] ? ? ? Event Date/Time  ? First MD Initiated Contact with Patient 08/26/21 0138   ?  (approximate) ? ? ?History  ? ?Aphasia ? ? ?HPI ? ?Krista Cisneros is a 81 y.o. female past medical history of coronary disease, breast cancer in remission, chronic systolic heart failure, COPD, hyperlipidemia who presents with difficulty with speech.  This is been going on for the last 4 days.  Her daughter notes difficulty with finding words and sounding like there is peanut butter in her mouth.  Is been rather consistent over the last 4 days.  Patient otherwise denies numbness tingling weakness difficulty with vision.  She denies fevers chest pain shortness of breath abdominal pain nausea vomiting.  Has had some lower back discomfort which she says is chronic but when she has a UTI feels worse.  Denies dysuria urgency frequency. ?  ? ?Past Medical History:  ?Diagnosis Date  ? Anxiety   ? Arthritis   ? CAD (coronary artery disease)   ? a. NSTEMI 12/19; b. LHC 04/10/18: pLAD 95%, mLAD 80%, m-dLCx 95%, OM3-1 lesion 40%, OM3-2 lesion 60%, mRCA 50%, EF 35-45%, successful PCI/DES x 2 to the LAD with recommended staged PCI of the LCx in a few weeks  ? Cancer [facility])   ? Right Breast Cancer  ? Chronic systolic CHF (congestive heart failure) ([place])   ? a. TTE 12/19: EF 30-35%, anteroseptal, anterior, and apical HK, Gr1DD, mild to mod MR, mildly dilated LA, RVSF nl  ? COPD (chronic obstructive pulmonary disease) (HCC)   ? Depression   ? Dyspnea   ? with exertion  ? GERD (gastroesophageal reflux disease)   ? Hyperlipidemia   ? Hypertension   ? Personal history of chemotherapy 2018  ? chemo prior to lumpectomy of right breast  ? Personal history of radiation therapy 2019  ? right breast ca  ? ? ?Patient Active Problem List  ? Diagnosis Date Noted  ? Acute respiratory failure with hypoxia ([place]) 11/14/2020  ? COPD with acute exacerbation ([place]) 11/13/2020  ? Community acquired pneumonia  06/17/2020  ? Generalized weakness 06/17/2020  ? History of COVID-19, December 2021 06/17/2020  ? History of breast cancer 06/17/2020  ? Chronic diastolic CHF (congestive heart failure) ([place]) 06/17/2020  ? Stage 3a chronic kidney disease ([place]) 06/17/2020  ? Sepsis due to Escherichia coli (E. coli) ([place]) 10/10/2019  ? Acute lower UTI 10/10/2019  ? SIRS (systemic inflammatory response syndrome) ([place]) 10/07/2019  ? Constipation 10/07/2019  ? Urinary retention 10/07/2019  ? Iron deficiency anemia 05/30/2019  ? Ischemic cardiomyopathy 06/03/2018  ? Coronary artery disease involving native coronary artery of native heart with angina pectoris ([place]) 05/14/2018  ? Chronic systolic heart failure ([place])   ? Coronary artery disease involving native coronary artery of native heart without angina pectoris 04/22/2018  ? Non-STEMI (non-ST elevated myocardial infarction) ([place]) 04/22/2018  ? NSTEMI (non-ST elevated myocardial infarction) ([place]) 04/09/2018  ? Rectal bleeding 03/22/2018  ? Malignant neoplasm of upper-outer quadrant of right breast in female, estrogen receptor negative ([place]) 06/19/2017  ? Incarcerated hernia 03/26/2017  ? Incarcerated inguinal hernia   ? Acute kidney injury ([place]) 02/07/2017  ? Hypomagnesemia 01/30/2017  ? Goals of care, counseling/discussion 10/20/2016  ? Malignant neoplasm of right female breast ([place]) 10/16/2016  ? Hyperlipidemia, mixed 10/08/2016  ? Chronic venous insufficiency 09/14/2016  ? GERD (gastroesophageal reflux disease) 09/14/2016  ? Pain in limb 05/29/2016  ? Essential hypertension 05/29/2016  ? COPD (  chronic obstructive pulmonary disease) ([place]) 05/29/2016  ? Hardening of the aorta (main artery of the heart) ([place]) 01/07/2016  ? Hyperglycemia, unspecified 01/07/2016  ? Neuropathy 11/15/2015  ? Severe recurrent major depression without psychotic features ([name]) 09/10/2015  ? Allergic rhinitis 04/10/2014  ? Depression 04/10/2014  ? Obesity, unspecified 04/10/2014  ? ? ? ?Physical Exam  ?Triage  Vital Signs: ?ED Triage Vitals  ?Enc Vitals Group  ?   BP 08/25/21 2212 125/73  ?   Pulse Rate 08/25/21 2212 83  ?   Resp 08/25/21 2212 18  ?   Temp 08/25/21 2212 97.6 ?F (36.4 ?C)  ?   Temp Source 08/25/21 2212 Oral  ?   SpO2 08/25/21 2212 100 %  ?   Weight 08/25/21 2209 140 lb (63.5 kg)  ?   Height 08/25/21 2209 $RemoveBefor'5\' 2"'KfqBvbFZYCur$  (1.575 m)  ?   Head Circumference --   ?   Peak Flow --   ?   Pain Score 08/25/21 2208 7  ?   Pain Loc --   ?   Pain Edu? --   ?   Excl. in [place]? --   ? ? ?Most recent vital signs: ?Vitals:  ? 08/26/21 0326 08/26/21 0330  ?BP: (!) 155/74 (!) 145/80  ?Pulse: 73   ?Resp: 18 20  ?Temp:    ?SpO2: 98%   ? ? ? ?General: Awake, no distress.  ?CV:  Good peripheral perfusion.  ?Resp:  Normal effort.  ?Abd:  No distention.  ?Neuro:             Awake, Alert, Oriented x 3  ?Other:  Aox3, nml speech  ?PERRL, EOMI, face symmetric, nml tongue movement  ?Has normal repetition and naming, occasionally with long sentences has difficulty finding her words ?5/5 strength in the BL upper and lower extremities  ?Sensation grossly intact in the BL upper and lower extremities  ?Finger-nose-finger intact BL ? ? ? ?ED Results / Procedures / Treatments  ?Labs ?(all labs ordered are listed, but only abnormal results are displayed) ?Labs Reviewed  ?BASIC METABOLIC PANEL - Abnormal; Notable for the following components:  ?    Result Value  ? Glucose, Bld 162 (*)   ? BUN 39 (*)   ? Creatinine, Ser 1.76 (*)   ? GFR, Estimated 29 (*)   ? All other components within normal limits  ?CBC - Abnormal; Notable for the following components:  ? WBC 12.4 (*)   ? Hemoglobin 11.6 (*)   ? MCH 25.0 (*)   ? MCHC 29.0 (*)   ? RDW 18.3 (*)   ? Platelets 588 (*)   ? All other components within normal limits  ?URINALYSIS, ROUTINE W REFLEX MICROSCOPIC - Abnormal; Notable for the following components:  ? Color, Urine YELLOW (*)   ? APPearance HAZY (*)   ? Leukocytes,Ua SMALL (*)   ? Bacteria, UA RARE (*)   ? All other components within normal limits   ?URINE CULTURE  ?CBG MONITORING, ED  ? ? ? ?EKG ? ?EKG interpretation performed by myself: NSR, nml axis, nml intervals, no acute ischemic changes ? ? ? ?RADIOLOGY ?I reviewed the CT scan of the brain which does not show any acute intracranial process; agree with radiology report  ? ? ?PROCEDURES: ? ?Critical Care performed: No ? ?.1-3 Lead EKG Interpretation ?Performed by: Rada Hay, MD ?Authorized by: Rada Hay, MD  ? ?  Interpretation: normal   ?  ECG rate assessment: normal   ?  Rhythm: sinus rhythm   ?  Ectopy: none   ?  Conduction: normal   ? ?The patient is on the cardiac monitor to evaluate for evidence of arrhythmia and/or significant heart rate changes. ? ? ?MEDICATIONS ORDERED IN ED: ?Medications  ?levofloxacin (LEVAQUIN) tablet 750 mg (750 mg Oral Given 08/26/21 0350)  ? ? ? ?IMPRESSION / MDM / ASSESSMENT AND PLAN / ED COURSE  ?I reviewed the triage vital signs and the nursing notes. ?             ?               ? ?Differential diagnosis includes, but is not limited to, CVA, metastatic lesion, metabolic abnormality, dehydration, UTI, pneumonia ? ?The patient is a 81 year old female presents with difficulty with speech.  Described as both word finding difficulty as well as slurred speech.  Daughter describes it is that she has a mouthful of peanut butter.  Patient otherwise complains of some low back pain that is rather chronic.  She says it does feel worse at when she has a UTI but she has no urinary symptoms.  No respiratory symptoms.  On exam her neurologic exam is rather nonfocal.  Does intermittently when speaking long sentences because and has some difficulty finding her words.  She has no numbness or weakness no facial asymmetry.  CT head obtained is negative for acute abnormality.  MRI of the brain, which was ordered from triage prior to my evaluation the patient, does not show any acute stroke.  Patient has a history of breast cancer I discussed with her that ideally she would  have an MRI with and without contrast to rule out met causing difficulty with speech but she preferred not to have repeat MRI in the ED.  Labs were obtained showing leukocytosis.  UA shows 6-10 WBCs question whethe

## 2021-08-26 NOTE — ED Notes (Signed)
Pt given orange juice and graham crackers

## 2021-08-26 NOTE — ED Notes (Signed)
Pt is in MRI at this time.

## 2021-08-26 NOTE — Discharge Instructions (Signed)
You may have pneumonia or UTI which could be causing your speech difficulty.  Your MRI and CT did not show any obvious stroke.  If your symptoms or not improving you should have an MRI with contrast to rule out any metastatic lesion to your brain.  Please follow-up with your primary care provider.  If you develop any new neurologic symptoms please return to the emergency department. ?

## 2021-08-27 LAB — URINE CULTURE: Culture: 10000 — AB

## 2021-08-31 ENCOUNTER — Ambulatory Visit: Payer: Self-pay

## 2021-08-31 ENCOUNTER — Ambulatory Visit: Payer: Medicare Other | Admitting: Cardiovascular Disease

## 2021-08-31 ENCOUNTER — Encounter: Payer: Self-pay | Admitting: Cardiovascular Disease

## 2021-08-31 VITALS — BP 120/64 | HR 83 | Ht 62.0 in | Wt 143.0 lb

## 2021-08-31 DIAGNOSIS — I10 Essential (primary) hypertension: Secondary | ICD-10-CM

## 2021-08-31 DIAGNOSIS — I25119 Atherosclerotic heart disease of native coronary artery with unspecified angina pectoris: Secondary | ICD-10-CM

## 2021-08-31 DIAGNOSIS — I639 Cerebral infarction, unspecified: Secondary | ICD-10-CM

## 2021-08-31 DIAGNOSIS — E782 Mixed hyperlipidemia: Secondary | ICD-10-CM

## 2021-08-31 DIAGNOSIS — I5032 Chronic diastolic (congestive) heart failure: Secondary | ICD-10-CM | POA: Diagnosis not present

## 2021-08-31 NOTE — Progress Notes (Signed)
Cardiology Office Note ? ?Date:  08/31/2021  ? ?ID:  Krista Cisneros, DOB 1940-12-01, MRN [id number] ? ?PCP:  Sharyne Peach, MD  ? ?Chief Complaint  ?Patient presents with  ? OTher  ?  Patient c.o swelling in ankles and SOB. Meds reviewed verbally with patient.   ? ? ?HPI: ?Krista Cisneros is a 81 y.o. female history of  ?CAD with recent NSTEMI in mid 03/2018 s/p PCI as below,   ?chronic systolic CHF secondary to ICM,  ?COPD secondary to prior tobacco abuse quitting in 1990,  ?HTN,  ?HLD,  ?breast cancer s/p chemoradiation,  ?GERD admission to [facility] 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 ?Presents today with family ?They report recent episode of aphasia Aug 26, 2021, was evaluated in the hospital ?Reported symptoms for 4 days ?Creatinine 1.76 BUN 39 WBC 12 ?Concern for UTI, treated with Levaquin ?Has felt better since that time ? ?Reports having chronic cough/congestion for 6 months, followed by Dr. Raul Del ?Bronchiectasis ?Has nebulizer but does not use it regularly ? ?Head MRI reviewed from the ER as detailed above ?1. No acute intracranial abnormality. ?2. 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 ? ?Main complaint today is worsening ankle swelling ?Taking lasix 20 daily, for 1 week or so, has not noticed much improvement in the leg swelling ? ?EKG personally reviewed by myself on todays visit ?Normal sinus rhythm with no significant ST-T wave changes rate 83 bpm ? ?Past medical history reviewed ?COVID December 2021 ?Diagnosis of bronchopneumonia June 20, 2020 ?Was on Levaquin ?Cough since then ? ?Last echocardiogram June 2021, ejection fraction 50 to 55% ? ?No recent falls ?Sedentary ?No regular exercise program ? ?Other past medical history reviewed ?cath 04/10/18 ?Multivessel  disease ?Severe mid LAD disease 95%, mid to distal stenosis as well 80% ?Proximal OM 3 disease large Cisneros 95% ?Moderate RCA disease 50% mid Cisneros ? ?Successful angioplasty and drug-eluting stent placement to the LAD x2. ? ?1. 90% mid LCx stenosis, unchanged from catheterization last month. ?2. Widely patent proximal and mid LAD stents. ?3. Successful PCI to mid LCx using Resolute Onyx 2.25 x 15 mm drug-eluting stent (postdilated to 2.6 mm) with 0% residual stenosis and TIMI-3 flow. ? ? On 05/14/2018 ?NSTEMI along with chest pain and in worsening fatigue.  ? cardiac catheterization results detailed below, discussed with her ?2 stents to the LAD, staged procedure with follow-up stent to the left circumflex ? ?Ejection fraction of 30 to 35% by echocardiogram April 10, 2018 ? ? ?PMH:   has a past medical history of Anxiety, Arthritis, CAD (coronary artery disease), Cancer ([place]), Chronic systolic CHF (congestive heart failure) ([place]), COPD (chronic obstructive pulmonary disease) ([place]), Depression, Dyspnea, GERD (gastroesophageal reflux disease), Hyperlipidemia, Hypertension, Personal history of chemotherapy (2018), and Personal history of radiation therapy (2019). ? ?PSH:    ?Past Surgical History:  ?Procedure Laterality Date  ? ABDOMINAL HYSTERECTOMY  1990  ? Partial  ? BREAST BIOPSY Right 10/04/2016  ? axilla lymph node (metastatic carcinoma) and axillay tail mass biopsy-invasive mammary carcinoma  ? BREAST LUMPECTOMY Right 03/21/2017  ? chemo first, [facility] and metastatic LN  ? BREAST LUMPECTOMY Right 05/21/2017  ? re-excision for clip  ? BREAST LUMPECTOMY WITH NEEDLE LOCALIZATION Right 03/21/2017  ? Procedure: BREAST LUMPECTOMY WITH NEEDLE LOCALIZATION;  Surgeon: Clayburn Pert, MD;  Location: [facility] ORS;  Service: General;  Laterality: Right;  ? CORONARY STENT INTERVENTION N/A 04/10/2018  ? Procedure: CORONARY STENT INTERVENTION;  Surgeon: Wellington Hampshire, MD;  Location: [place] [facility];  Service:  Cardiovascular;  Laterality: N/A;  ? CORONARY STENT INTERVENTION N/A 05/14/2018  ? Procedure: CORONARY STENT INTERVENTION;  Surgeon: Nelva Bush, MD;  Location: [place] [facility];  Service: Cardiovascular;  Laterality: N/A;  ? DILATION AND CURETTAGE OF UTERUS    ? INGUINAL HERNIA REPAIR Right 03/26/2017  ? Procedure: HERNIA REPAIR INGUINAL INCARCERATED;  Surgeon: Jules Husbands, MD;  Location: [facility] ORS;  Service: General;  Laterality: Right;  ? LEFT HEART CATH AND CORONARY ANGIOGRAPHY N/A 04/10/2018  ? Procedure: LEFT HEART CATH AND CORONARY ANGIOGRAPHY poss PCI;  Surgeon: Minna Merritts, MD;  Location: [place] [facility];  Service: Cardiovascular;  Laterality: N/A;  ? PORTA CATH REMOVAL N/A 12/06/2020  ? Procedure: PORTA CATH REMOVAL;  Surgeon: Algernon Huxley, MD;  Location: [place] [facility];  Service: Cardiovascular;  Laterality: N/A;  ? PORTACATH PLACEMENT Left 10/24/2016  ? Procedure: INSERTION PORT-A-CATH;  Surgeon: [name], MD;  Location: [facility];  Service: General;  Laterality: Left;  ? RE-EXCISION OF BREAST LUMPECTOMY Right 05/21/2017  ? Procedure: RE-EXCISION OF BREAST LUMPECTOMY;  Surgeon: Clayburn Pert, MD;  Location: [facility] OR[facility];  Service: General;  Laterality: Right;  ? SENTINEL NODE BIOPSY Right 03/21/2017  ? Procedure: SENTINEL NODE BIOPSY;  Surgeon: Clayburn Pert, MD;  Location: [facility] OR[facility];  Service: General;  Laterality: Right;  ? ? ?Current Outpatient Medications  ?Medication Sig Dispense Refill  ? albuterol (PROVENTIL HFA;VENTOLIN HFA) 108 (90 Base) MCG/ACT inhaler Inhale 2 puffs into the lungs every 6 (six) hours as needed for wheezing or shortness of breath.    ? baclofen (LIORESAL) 10 MG tablet Take 10 mg by mouth at bedtime.  9  ? carvedilol (COREG) 6.25 MG tablet Take 6.25 mg by mouth 2 (two) times daily.    ? cholecalciferol (VITAMIN D3) 25 MCG (1000 UT) tablet Take 1,000 Units by mouth daily.    ? clopidogrel (PLAVIX) 75 MG tablet TAKE 1 TABLET (75 MG TOTAL) BY MOUTH DAILY  WITH BREAKFAST. 90 tablet 3  ? Coenzyme Q10 300 MG CAPS Take 300 mg by mouth daily.     ? DULoxetine (CYMBALTA) 30 MG capsule Take 30 mg by mouth daily. (Take with '60mg'$  capsule to equal '90mg'$ )  11  ? DULoxetine (CYMBALTA) 60 MG capsule Take 60 mg by mouth daily. (Take with '30mg'$  capsule to equal '90mg'$ )    ? enalapril (VASOTEC) 5 MG tablet TAKE 1 TABLET (5 MG TOTAL) BY MOUTH DAILY. 90 tablet 3  ? feeding supplement (ENSURE ENLIVE / ENSURE PLUS) LIQD Take 237 mLs by mouth daily.    ? fexofenadine (ALLEGRA) 180 MG tablet Take 180 mg by mouth daily.    ? fluticasone (FLONASE) 50 MCG/ACT nasal spray Place 2 sprays into the nose 2 (two) times daily as needed for allergies or rhinitis.    ? fluticasone furoate-vilanterol (BREO ELLIPTA) 200-25 MCG/INH AEPB Inhale 1 puff into the lungs daily.    ? furosemide (LASIX) 20 MG tablet Take 1 tablet (20 mg total) by mouth daily as needed for edema. 30 tablet 11  ? HYDROcodone bit-homatropine (HYCODAN) 5-1.5 MG/5ML syrup Take 5 mLs by mouth every 6 (six) hours as needed.    ? isosorbide mononitrate (IMDUR) 30 MG 24 hr tablet Take 15 mg by mouth  daily.    ? letrozole (FEMARA) 2.5 MG tablet TAKE 1 TABLET BY MOUTH EVERY DAY 90 tablet 1  ? levofloxacin (LEVAQUIN) 750 MG tablet Take 1 tablet (750 mg total) by mouth daily for 5 days. 5 tablet 0  ? lidocaine-prilocaine (EMLA) cream Apply 1 application. topically as needed (for port access).    ? Magnesium Oxide 250 MG TABS Take 250 mg by mouth daily.    ? Melatonin 10 MG TABS Take 10 mg by mouth at bedtime as needed (sleep).    ? montelukast (SINGULAIR) 10 MG tablet Take 10 mg by mouth at bedtime.     ? pantoprazole (PROTONIX) 40 MG tablet Take 1 tablet (40 mg total) by mouth daily. PLEASE CALL OFFICE TO SCHEDULE ANNUAL VISIT FOR FURTHER REFILLS. 90 tablet 0  ? rosuvastatin (CRESTOR) 20 MG tablet TAKE 1 TABLET BY MOUTH EVERY DAY AT 6PM 90 tablet 3  ? spironolactone (ALDACTONE) 25 MG tablet Take 0.5 tablets (12.5 mg total) by mouth daily. 45  tablet 3  ? tiotropium (SPIRIVA) 18 MCG inhalation capsule Place 18 mcg into inhaler and inhale daily.    ? ?No current facility-administered medications for this visit.  ? ?Facility-Administered Medications Ordered in O

## 2021-08-31 NOTE — Patient Instructions (Addendum)
Medication Instructions:  ?No changes ? ?If you need a refill on your cardiac medications before your next appointment, please call your pharmacy.  ? ?Lab work: ?No new labs needed ? ?Testing/Procedures: ? ?Your physician has requested that you have an echocardiogram. Echocardiography is a painless test that uses sound waves to create images of your heart. It provides your doctor with information about the size and shape of your heart and how well your heart?s chambers and valves are working. This procedure takes approximately one hour. There are no restrictions for this procedure.  ? ?Your provider has ordered a heart monitor to wear for 14 days. This will be mailed to your home with instructions on placement. Once you have finished the time frame requested, you will return monitor in box provided. ? ?  ?Follow-Up: ?At [facility], you and your health needs are our priority.  As part of our continuing mission to provide you with exceptional heart care, we have created designated Provider Care Teams.  These Care Teams include your primary Cardiologist (physician) and Advanced Practice Providers (APPs -  Physician Assistants and Nurse Practitioners) who all work together to provide you with the care you need, when you need it. ? ?You will need a follow up appointment in 3 months ? ?Providers on your designated Care Team:   ?Murray Hodgkins, NP ?Christell Faith, PA-C ?Cadence Kathlen Mody, PA-C ? ?COVID-19 Vaccine Information can be found at: ShippingScam.co.uk For questions related to vaccine distribution or appointments, please email [email or web address] or call [phone].  ? ?

## 2021-09-02 ENCOUNTER — Observation Stay: Payer: Medicare Other

## 2021-09-02 ENCOUNTER — Emergency Department: Payer: Medicare Other

## 2021-09-02 ENCOUNTER — Other Ambulatory Visit: Payer: Self-pay

## 2021-09-02 ENCOUNTER — Inpatient Hospital Stay
Admission: EM | Admit: 2021-09-02 | Discharge: 2021-09-05 | DRG: 178 | Disposition: A | Discharge status: Home Health | Payer: Medicare Other | Attending: Osteopathic Medicine | Admitting: Osteopathic Medicine

## 2021-09-02 ENCOUNTER — Encounter: Payer: Self-pay | Admitting: Radiology

## 2021-09-02 DIAGNOSIS — Z8673 Personal history of transient ischemic attack (TIA), and cerebral infarction without residual deficits: Secondary | ICD-10-CM

## 2021-09-02 DIAGNOSIS — Z79899 Other long term (current) drug therapy: Secondary | ICD-10-CM

## 2021-09-02 DIAGNOSIS — I25119 Atherosclerotic heart disease of native coronary artery with unspecified angina pectoris: Secondary | ICD-10-CM | POA: Diagnosis present

## 2021-09-02 DIAGNOSIS — R0602 Shortness of breath: Secondary | ICD-10-CM | POA: Diagnosis present

## 2021-09-02 DIAGNOSIS — R609 Edema, unspecified: Secondary | ICD-10-CM

## 2021-09-02 DIAGNOSIS — J439 Emphysema, unspecified: Secondary | ICD-10-CM | POA: Diagnosis present

## 2021-09-02 DIAGNOSIS — R4182 Altered mental status, unspecified: Secondary | ICD-10-CM

## 2021-09-02 DIAGNOSIS — R296 Repeated falls: Secondary | ICD-10-CM | POA: Diagnosis present

## 2021-09-02 DIAGNOSIS — Z923 Personal history of irradiation: Secondary | ICD-10-CM

## 2021-09-02 DIAGNOSIS — F32A Depression, unspecified: Secondary | ICD-10-CM | POA: Diagnosis present

## 2021-09-02 DIAGNOSIS — Z9183 Wandering in diseases classified elsewhere: Secondary | ICD-10-CM

## 2021-09-02 DIAGNOSIS — Z8701 Personal history of pneumonia (recurrent): Secondary | ICD-10-CM

## 2021-09-02 DIAGNOSIS — Z79811 Long term (current) use of aromatase inhibitors: Secondary | ICD-10-CM

## 2021-09-02 DIAGNOSIS — J690 Pneumonitis due to inhalation of food and vomit: Secondary | ICD-10-CM | POA: Diagnosis not present

## 2021-09-02 DIAGNOSIS — E785 Hyperlipidemia, unspecified: Secondary | ICD-10-CM | POA: Diagnosis present

## 2021-09-02 DIAGNOSIS — N179 Acute kidney failure, unspecified: Secondary | ICD-10-CM | POA: Diagnosis not present

## 2021-09-02 DIAGNOSIS — J189 Pneumonia, unspecified organism: Principal | ICD-10-CM

## 2021-09-02 DIAGNOSIS — Z853 Personal history of malignant neoplasm of breast: Secondary | ICD-10-CM

## 2021-09-02 DIAGNOSIS — Z7189 Other specified counseling: Secondary | ICD-10-CM

## 2021-09-02 DIAGNOSIS — I252 Old myocardial infarction: Secondary | ICD-10-CM

## 2021-09-02 DIAGNOSIS — I5032 Chronic diastolic (congestive) heart failure: Secondary | ICD-10-CM | POA: Diagnosis present

## 2021-09-02 DIAGNOSIS — N189 Chronic kidney disease, unspecified: Secondary | ICD-10-CM | POA: Diagnosis present

## 2021-09-02 DIAGNOSIS — Z955 Presence of coronary angioplasty implant and graft: Secondary | ICD-10-CM

## 2021-09-02 DIAGNOSIS — Z888 Allergy status to other drugs, medicaments and biological substances status: Secondary | ICD-10-CM

## 2021-09-02 DIAGNOSIS — Z8616 Personal history of COVID-19: Secondary | ICD-10-CM

## 2021-09-02 DIAGNOSIS — M1990 Unspecified osteoarthritis, unspecified site: Secondary | ICD-10-CM | POA: Diagnosis present

## 2021-09-02 DIAGNOSIS — K449 Diaphragmatic hernia without obstruction or gangrene: Secondary | ICD-10-CM | POA: Diagnosis present

## 2021-09-02 DIAGNOSIS — Z90710 Acquired absence of both cervix and uterus: Secondary | ICD-10-CM

## 2021-09-02 DIAGNOSIS — Z87891 Personal history of nicotine dependence: Secondary | ICD-10-CM

## 2021-09-02 DIAGNOSIS — R059 Cough, unspecified: Secondary | ICD-10-CM | POA: Diagnosis present

## 2021-09-02 DIAGNOSIS — Z7902 Long term (current) use of antithrombotics/antiplatelets: Secondary | ICD-10-CM

## 2021-09-02 DIAGNOSIS — K219 Gastro-esophageal reflux disease without esophagitis: Secondary | ICD-10-CM | POA: Diagnosis present

## 2021-09-02 DIAGNOSIS — Z87440 Personal history of urinary (tract) infections: Secondary | ICD-10-CM

## 2021-09-02 DIAGNOSIS — Z7951 Long term (current) use of inhaled steroids: Secondary | ICD-10-CM

## 2021-09-02 DIAGNOSIS — F419 Anxiety disorder, unspecified: Secondary | ICD-10-CM | POA: Diagnosis present

## 2021-09-02 DIAGNOSIS — Z9221 Personal history of antineoplastic chemotherapy: Secondary | ICD-10-CM

## 2021-09-02 DIAGNOSIS — Z20822 Contact with and (suspected) exposure to covid-19: Secondary | ICD-10-CM | POA: Diagnosis present

## 2021-09-02 DIAGNOSIS — F01518 Vascular dementia, unspecified severity, with other behavioral disturbance: Secondary | ICD-10-CM | POA: Diagnosis present

## 2021-09-02 DIAGNOSIS — Z87892 Personal history of anaphylaxis: Secondary | ICD-10-CM

## 2021-09-02 DIAGNOSIS — R600 Localized edema: Secondary | ICD-10-CM | POA: Diagnosis present

## 2021-09-02 DIAGNOSIS — I5042 Chronic combined systolic (congestive) and diastolic (congestive) heart failure: Secondary | ICD-10-CM | POA: Diagnosis present

## 2021-09-02 DIAGNOSIS — R4701 Aphasia: Secondary | ICD-10-CM | POA: Diagnosis present

## 2021-09-02 DIAGNOSIS — I10 Essential (primary) hypertension: Secondary | ICD-10-CM | POA: Diagnosis present

## 2021-09-02 DIAGNOSIS — Z880 Allergy status to penicillin: Secondary | ICD-10-CM

## 2021-09-02 DIAGNOSIS — R410 Disorientation, unspecified: Secondary | ICD-10-CM | POA: Diagnosis present

## 2021-09-02 DIAGNOSIS — Z886 Allergy status to analgesic agent status: Secondary | ICD-10-CM

## 2021-09-02 DIAGNOSIS — Z79890 Hormone replacement therapy (postmenopausal): Secondary | ICD-10-CM

## 2021-09-02 DIAGNOSIS — I130 Hypertensive heart and chronic kidney disease with heart failure and stage 1 through stage 4 chronic kidney disease, or unspecified chronic kidney disease: Secondary | ICD-10-CM | POA: Diagnosis present

## 2021-09-02 DIAGNOSIS — J449 Chronic obstructive pulmonary disease, unspecified: Secondary | ICD-10-CM | POA: Diagnosis present

## 2021-09-02 LAB — CBC WITH DIFFERENTIAL/PLATELET
Abs Immature Granulocytes: 0.05 10*3/uL (ref 0.00–0.07)
Basophils Absolute: 0.1 10*3/uL (ref 0.0–0.1)
Basophils Relative: 1 %
Eosinophils Absolute: 0.6 10*3/uL — ABNORMAL HIGH (ref 0.0–0.5)
Eosinophils Relative: 6 %
HCT: 43.9 % (ref 36.0–46.0)
Hemoglobin: 12.8 g/dL (ref 12.0–15.0)
Immature Granulocytes: 1 %
Lymphocytes Relative: 20 %
Lymphs Abs: 2 10*3/uL (ref 0.7–4.0)
MCH: 24.5 pg — ABNORMAL LOW (ref 26.0–34.0)
MCHC: 29.2 g/dL — ABNORMAL LOW (ref 30.0–36.0)
MCV: 84.1 fL (ref 80.0–100.0)
Monocytes Absolute: 1.3 10*3/uL — ABNORMAL HIGH (ref 0.1–1.0)
Monocytes Relative: 14 %
Neutro Abs: 5.7 10*3/uL (ref 1.7–7.7)
Neutrophils Relative %: 58 %
Platelets: 416 10*3/uL — ABNORMAL HIGH (ref 150–400)
RBC: 5.22 MIL/uL — ABNORMAL HIGH (ref 3.87–5.11)
RDW: 18.6 % — ABNORMAL HIGH (ref 11.5–15.5)
WBC: 9.8 10*3/uL (ref 4.0–10.5)
nRBC: 0 % (ref 0.0–0.2)

## 2021-09-02 LAB — COMPREHENSIVE METABOLIC PANEL
ALT: 8 U/L (ref 0–44)
AST: 20 U/L (ref 15–41)
Albumin: 3.7 g/dL (ref 3.5–5.0)
Alkaline Phosphatase: 60 U/L (ref 38–126)
Anion gap: 9 (ref 5–15)
BUN: 47 mg/dL — ABNORMAL HIGH (ref 8–23)
CO2: 28 mmol/L (ref 22–32)
Calcium: 9.5 mg/dL (ref 8.9–10.3)
Chloride: 106 mmol/L (ref 98–111)
Creatinine, Ser: 2.04 mg/dL — ABNORMAL HIGH (ref 0.44–1.00)
GFR, Estimated: 24 mL/min — ABNORMAL LOW (ref >60)
Glucose, Bld: 120 mg/dL — ABNORMAL HIGH (ref 70–99)
Potassium: 4 mmol/L (ref 3.5–5.1)
Sodium: 143 mmol/L (ref 135–145)
Total Bilirubin: 0.6 mg/dL (ref 0.3–1.2)
Total Protein: 8 g/dL (ref 6.5–8.1)

## 2021-09-02 LAB — TSH: TSH: 5.682 u[IU]/mL — ABNORMAL HIGH (ref 0.350–4.500)

## 2021-09-02 LAB — URINALYSIS, ROUTINE W REFLEX MICROSCOPIC
Bilirubin Urine: NEGATIVE
Glucose, UA: NEGATIVE mg/dL
Hgb urine dipstick: NEGATIVE
Ketones, ur: NEGATIVE mg/dL
Leukocytes,Ua: NEGATIVE
Nitrite: NEGATIVE
Protein, ur: NEGATIVE mg/dL
Specific Gravity, Urine: 1.011 (ref 1.005–1.030)
pH: 5 (ref 5.0–8.0)

## 2021-09-02 LAB — RESP PANEL BY RT-PCR (FLU A&B, COVID) ARPGX2
Influenza A by PCR: NEGATIVE
Influenza B by PCR: NEGATIVE
SARS Coronavirus 2 by RT PCR: NEGATIVE

## 2021-09-02 LAB — PROCALCITONIN: Procalcitonin: <0.1 ng/mL

## 2021-09-02 LAB — TROPONIN I (HIGH SENSITIVITY): Troponin I (High Sensitivity): 8 ng/L (ref <18)

## 2021-09-02 LAB — T4, FREE: Free T4: 1.19 ng/dL — ABNORMAL HIGH (ref 0.61–1.12)

## 2021-09-02 LAB — BRAIN NATRIURETIC PEPTIDE: B Natriuretic Peptide: 62.5 pg/mL (ref 0.0–100.0)

## 2021-09-02 IMAGING — US US EXTREM LOW VENOUS
1 series · 13 of 24 positions shown · non-contrast
Comparison: None Available.



[Series 1: us venous img lower bilat (dvt) · portal-venous · 13 of 59 slices shown]
[im 1/59]
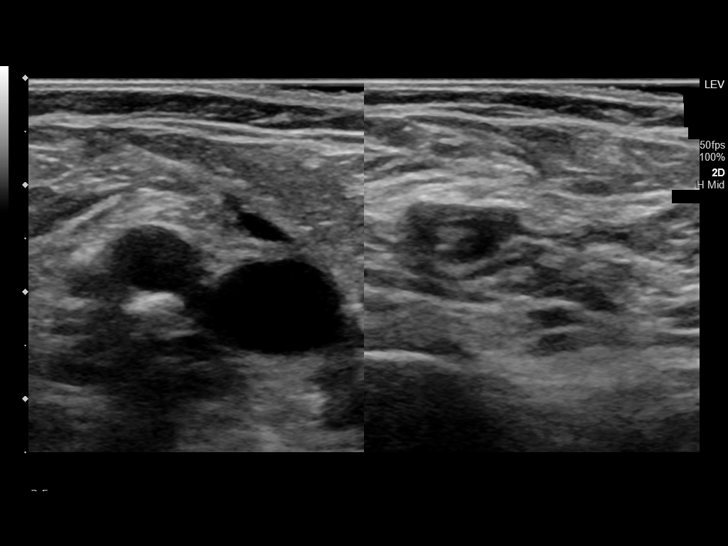
[im 6/59]
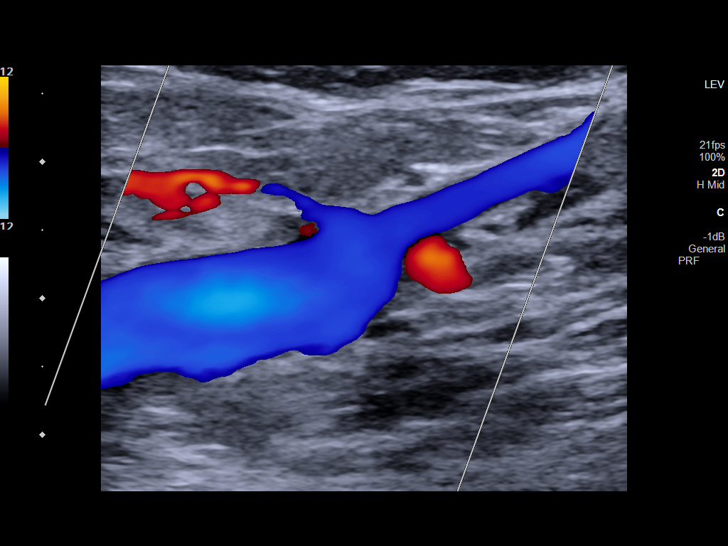
[im 11/59]
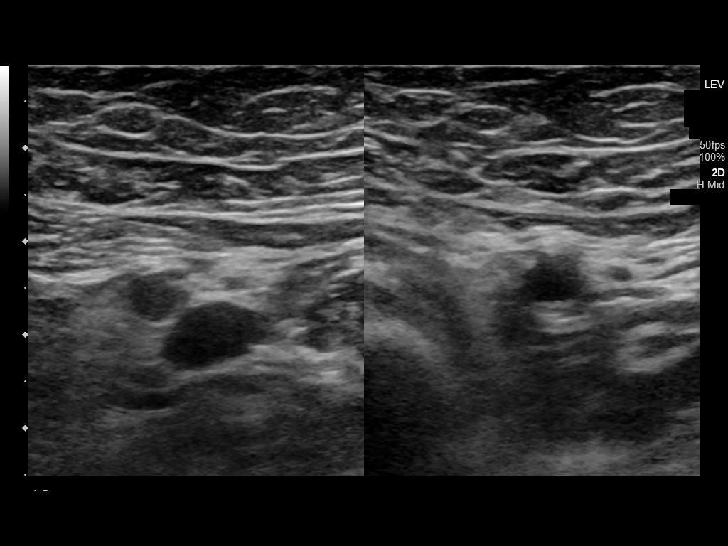
[im 16/59]
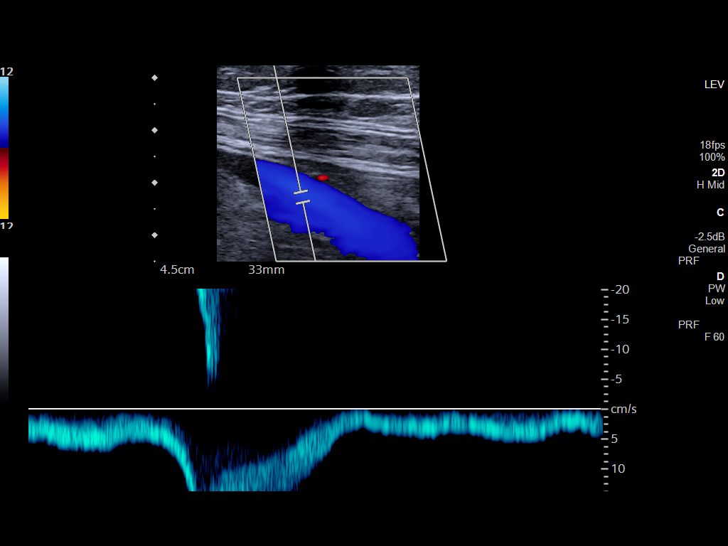
[im 21/59]
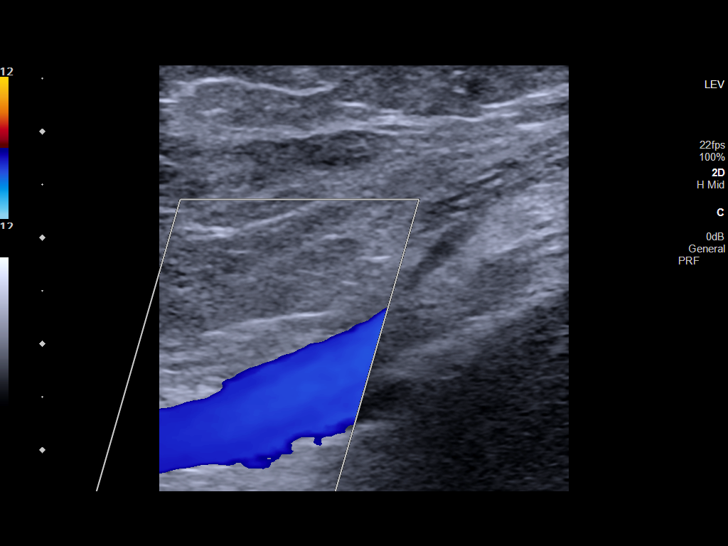
[im 26/59]
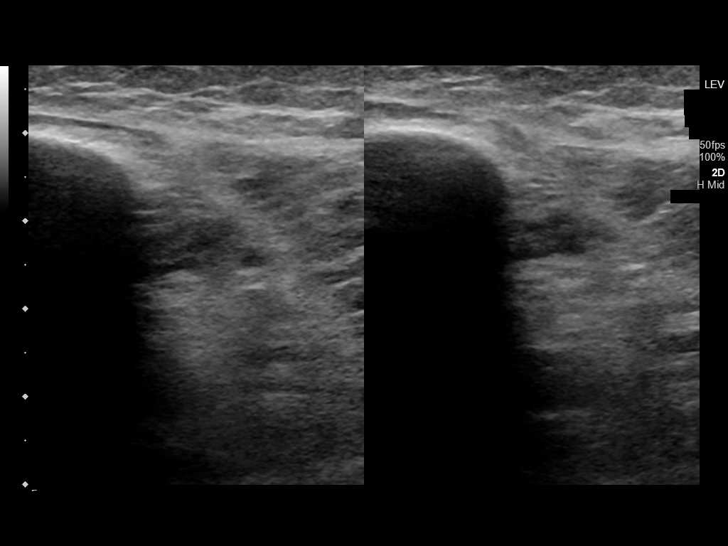
[im 31/59]
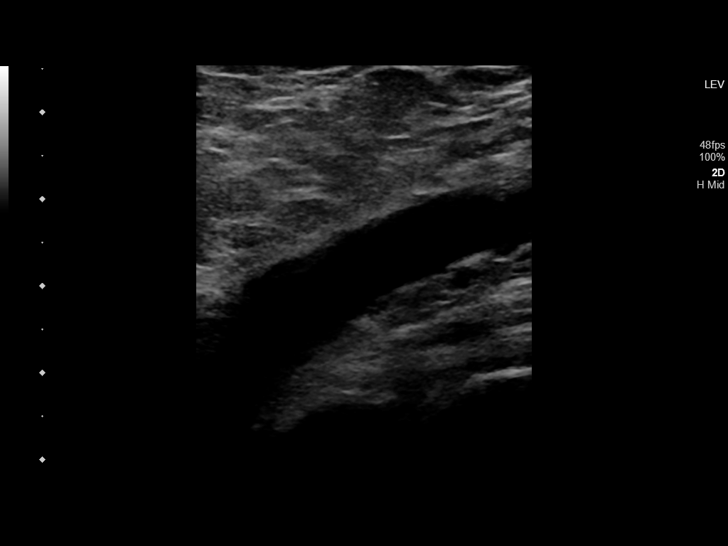
[im 33/59]
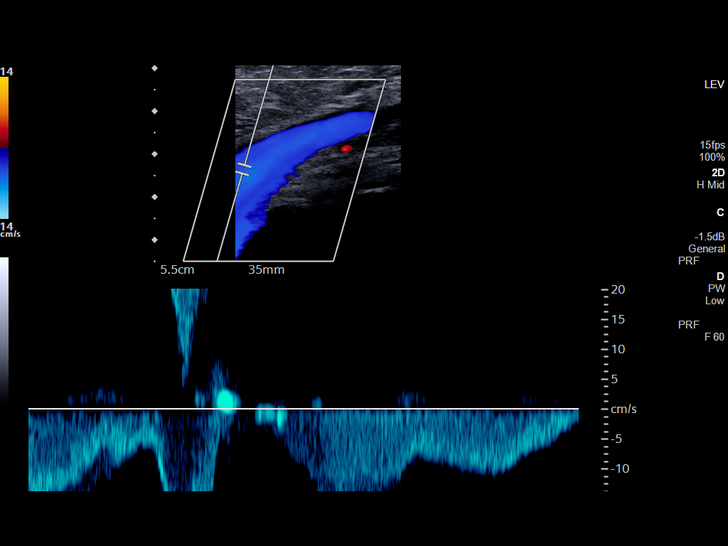
[im 38/59]
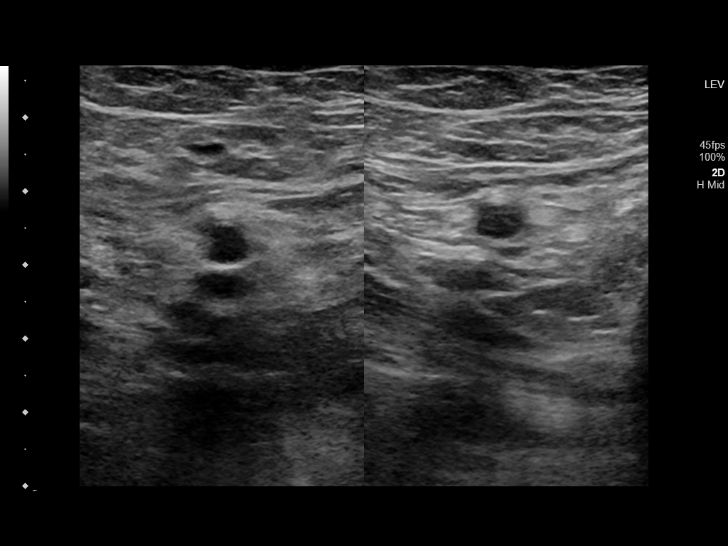
[im 43/59]
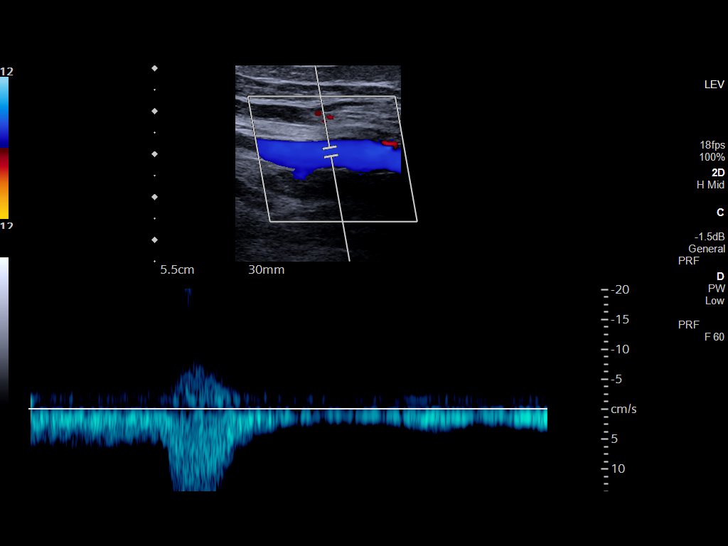
[im 48/59]
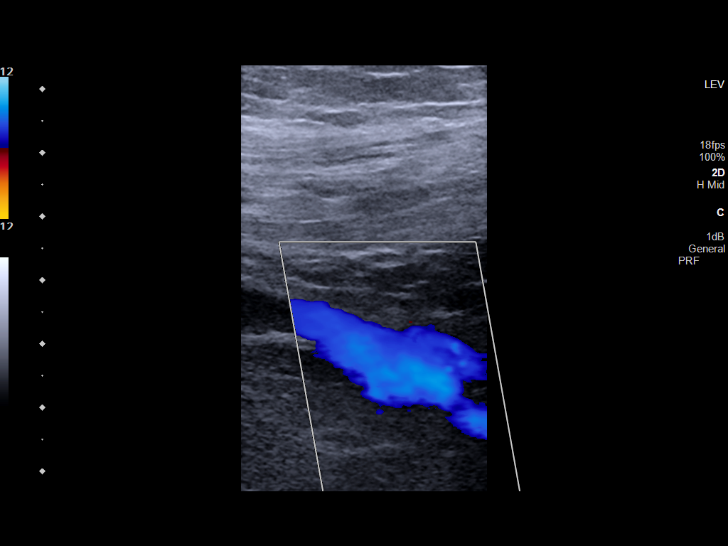
[im 53/59]
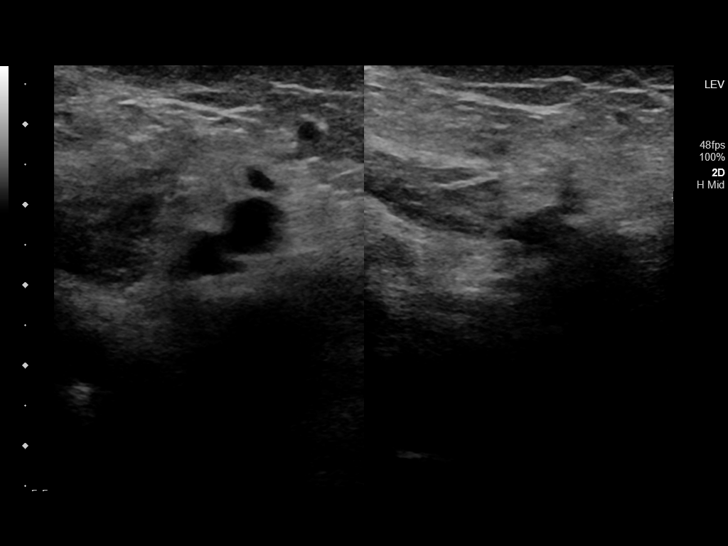
[im 59/59]
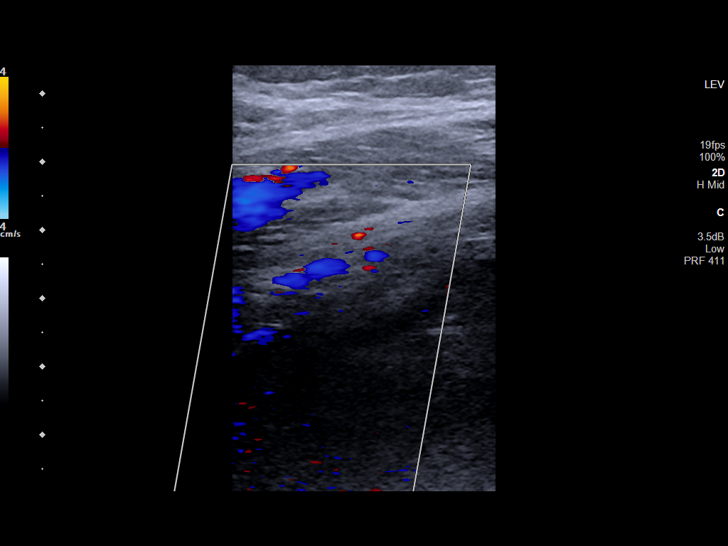

[13 of 24 positions shown; findings below may reference images not displayed]

FINDINGS: RIGHT LOWER EXTREMITY

Common Femoral Vein: No evidence of thrombus. Normal
compressibility, respiratory phasicity and response to augmentation.

Saphenofemoral Junction: No evidence of thrombus. Normal
compressibility and flow on color Doppler imaging.

Profunda Femoral Vein: No evidence of thrombus. Normal
compressibility and flow on color Doppler imaging.

Femoral Vein: No evidence of thrombus. Normal compressibility,
respiratory phasicity and response to augmentation.

Popliteal Vein: No evidence of thrombus. Normal compressibility,
respiratory phasicity and response to augmentation.

Calf Veins: No evidence of thrombus. Normal compressibility and flow
on color Doppler imaging.

Superficial Great Saphenous Vein: No evidence of thrombus. Normal
compressibility.

Limitations: Limited visualization of the calf veins.

LEFT LOWER EXTREMITY

Common Femoral Vein: No evidence of thrombus. Normal
compressibility, respiratory phasicity and response to augmentation.

Saphenofemoral Junction: No evidence of thrombus. Normal
compressibility and flow on color Doppler imaging.

Profunda Femoral Vein: No evidence of thrombus. Normal
compressibility and flow on color Doppler imaging.

Femoral Vein: No evidence of thrombus. Normal compressibility,
respiratory phasicity and response to augmentation.

Popliteal Vein: No evidence of thrombus. Normal compressibility,
respiratory phasicity and response to augmentation.

Calf Veins: No evidence of thrombus. Normal compressibility and flow
on color Doppler imaging.

Limitations: Limited visualization of the calf veins.
IMPRESSION: No evidence of deep venous thrombosis in either lower extremity.

## 2021-09-02 IMAGING — US US CAROTID DUPLEX BILAT
1 series · 13 of 24 positions shown · non-contrast
Comparison: None Available.

CLINICAL DATA: Stroke-like symptoms

EXAM:
BILATERAL CAROTID DUPLEX ULTRASOUND
TECHNIQUE: Gray scale imaging, color Doppler and duplex ultrasound were
performed of bilateral carotid and vertebral arteries in the neck.

[Series 1: us carotid bilateral · 13 of 64 slices shown]
[im 1/64]
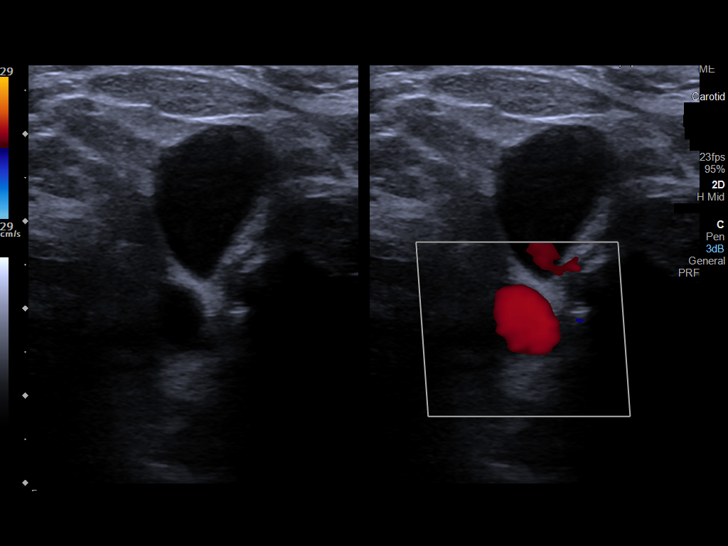
[im 6/64]
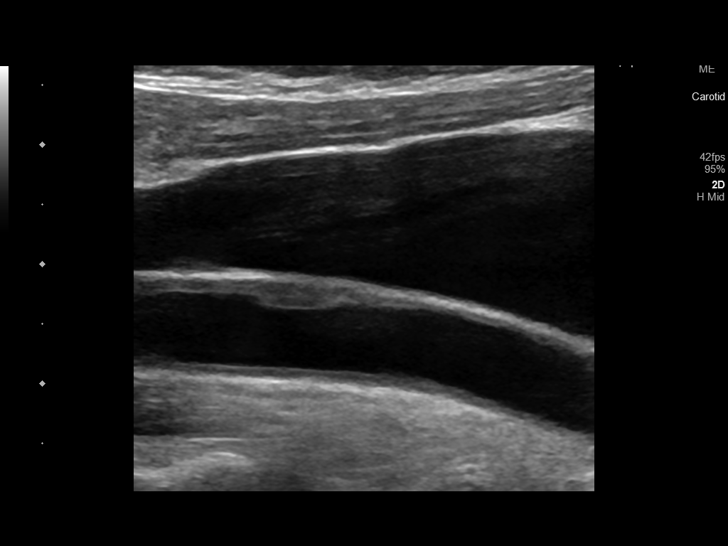
[im 11/64]
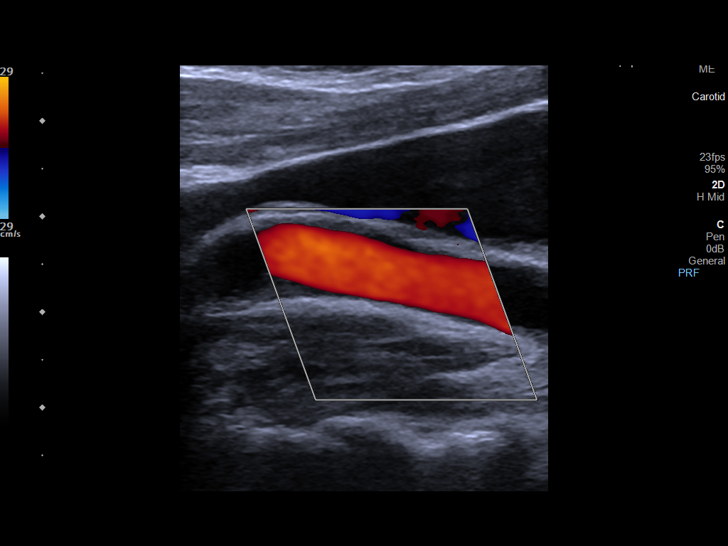
[im 17/64]
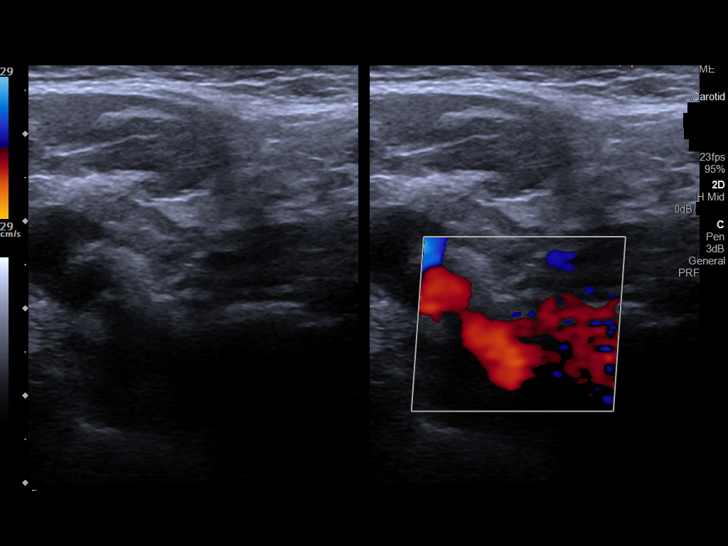
[im 22/64]
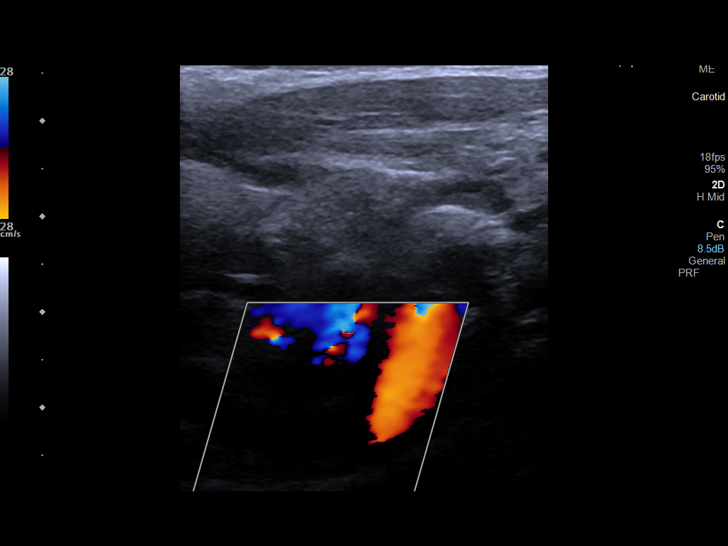
[im 28/64]
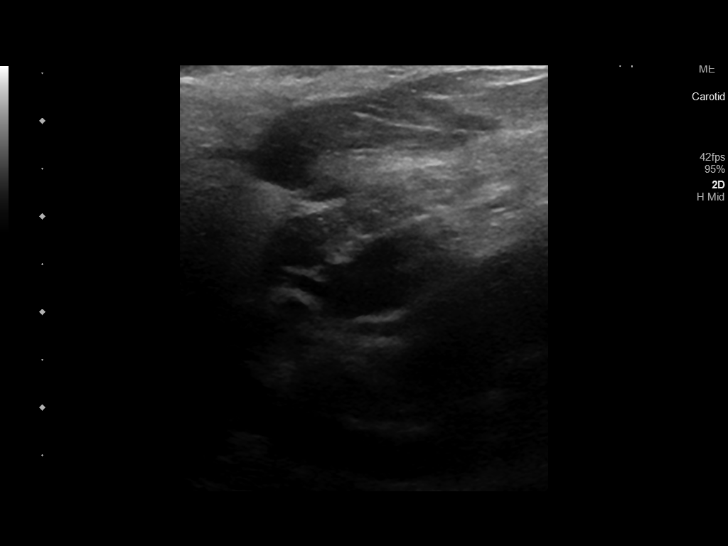
[im 33/64]
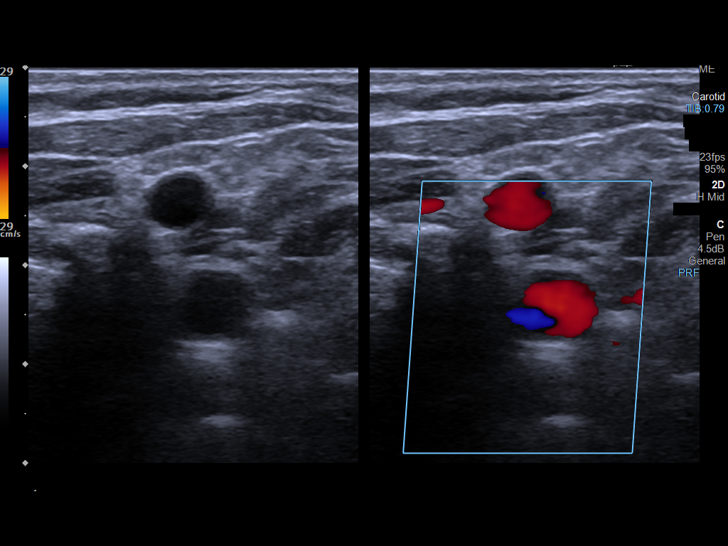
[im 36/64]
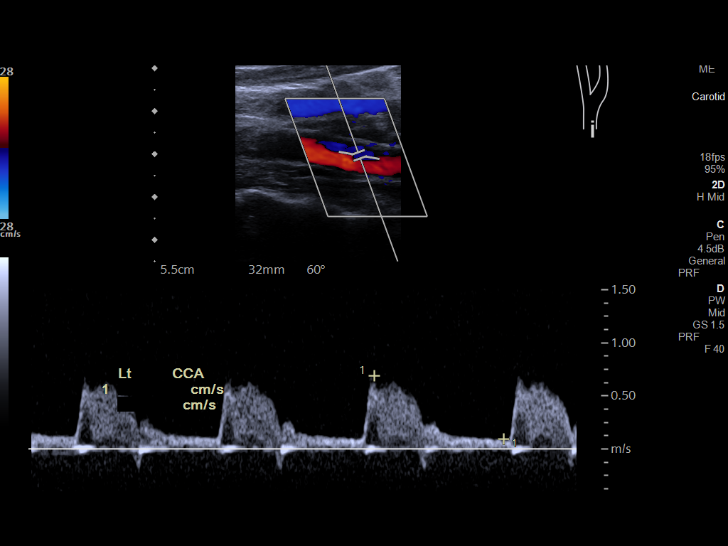
[im 42/64]
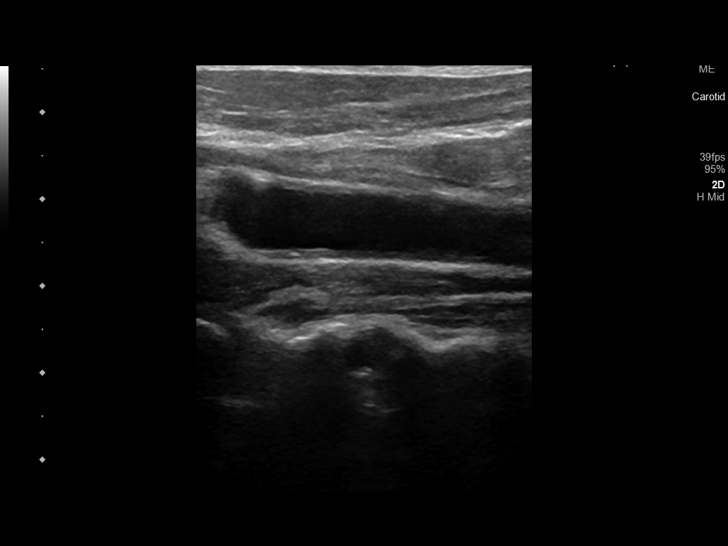
[im 47/64]
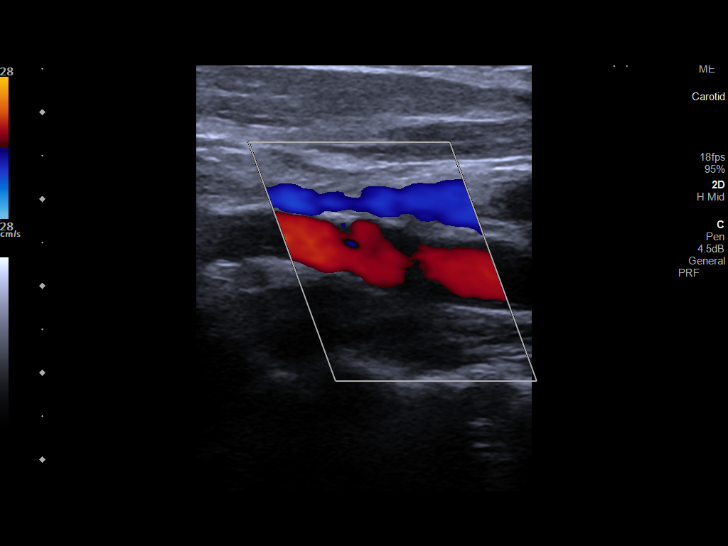
[im 53/64]
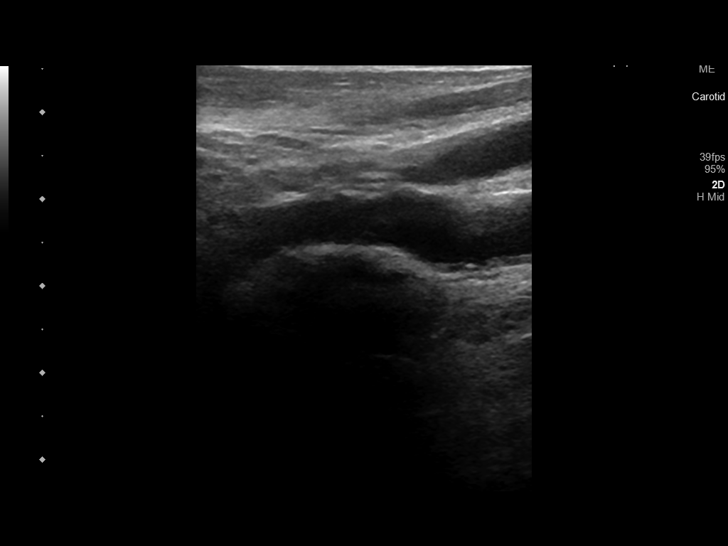
[im 58/64]
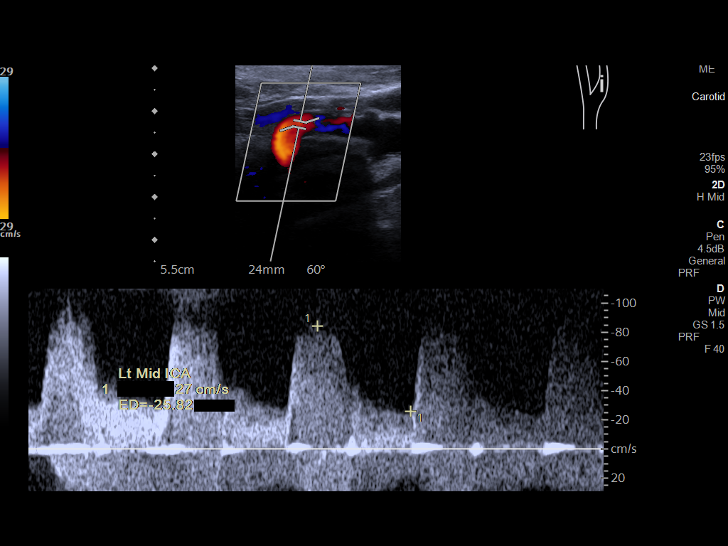
[im 64/64]
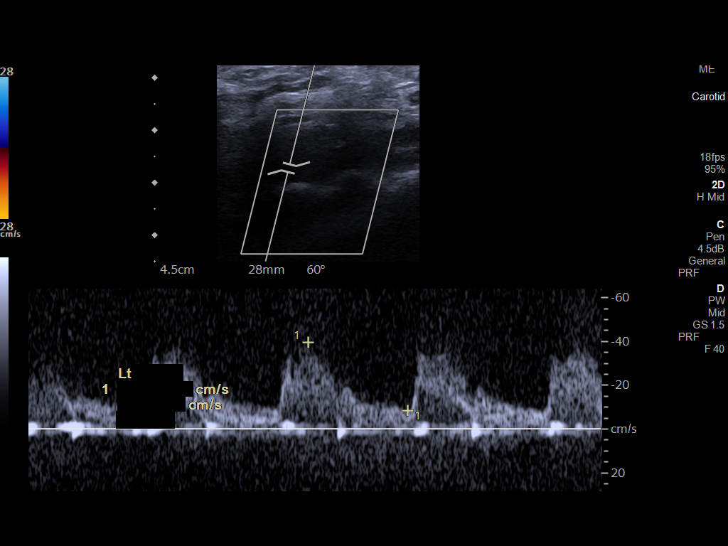

[13 of 24 positions shown; findings below may reference images not displayed]

FINDINGS: Criteria: Quantification of carotid stenosis is based on velocity
parameters that correlate the residual internal carotid diameter
with NASCET-based stenosis levels, using the diameter of the distal
internal carotid lumen as the denominator for stenosis measurement.

The following velocity measurements were obtained:

RIGHT

ICA: 75/24 cm/sec

CCA: 81/12 cm/sec

SYSTOLIC ICA/CCA RATIO:

ECA: 121 cm/sec

LEFT

ICA: 102/21 cm/sec

CCA: 74/9 cm/sec

SYSTOLIC ICA/CCA RATIO:

ECA: 101 cm/sec

RIGHT CAROTID ARTERY: Preliminary grayscale images demonstrate mild
intimal thickening within the distal common carotid artery and
extending into the proximal internal carotid artery. The waveforms,
velocities and flow velocity ratios however demonstrate no evidence
of focal hemodynamically significant stenosis.

RIGHT VERTEBRAL ARTERY:  Antegrade in nature.

LEFT CAROTID ARTERY: Preliminary grayscale images demonstrate no
significant atherosclerotic plaque. The waveforms, velocities and
flow velocity ratios show no evidence of focal hemodynamically
significant stenosis.

LEFT VERTEBRAL ARTERY:  Antegrade in nature.
IMPRESSION: No evidence of hemodynamically significant carotid stenosis.

## 2021-09-02 IMAGING — MR MR HEAD W/O CM
5 of 6 series · 35 of 48 positions shown · non-contrast
Comparison: Prior study from [DATE].  Filled

CLINICAL DATA: Initial evaluation for acute TIA.

EXAM:
MRI HEAD WITHOUT CONTRAST
TECHNIQUE: Multiplanar, multiecho pulse sequences of the brain and surrounding
structures were obtained without intravenous contrast.

[Series 5: ax dwi_tracew · axial · 3.0mm · 0.65mm/px · z∈[-94,+54]mm · 8 of 47 slices shown]
[im 1/47]
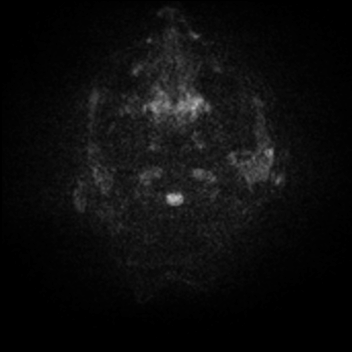
[im 6/47]
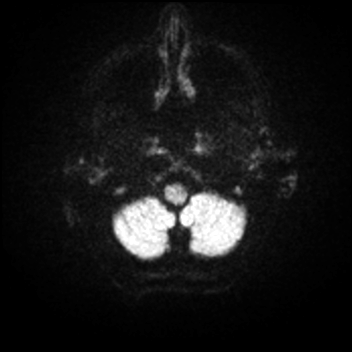
[im 16/47]
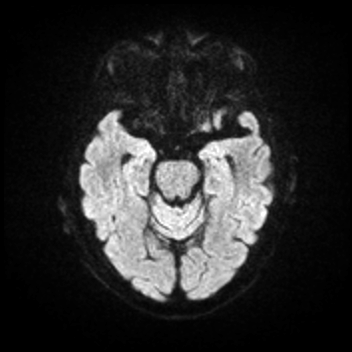
[im 21/47]
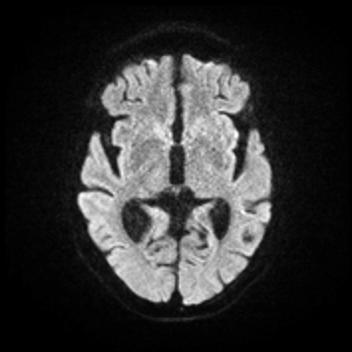
[im 26/47]
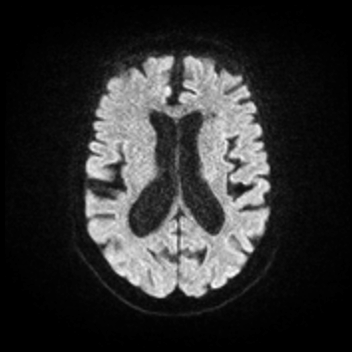
[im 31/47]
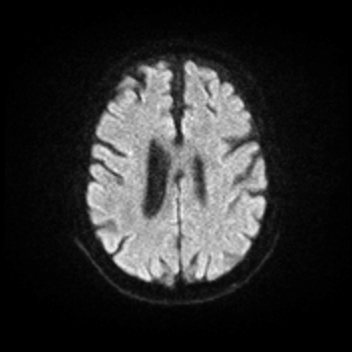
[im 41/47]
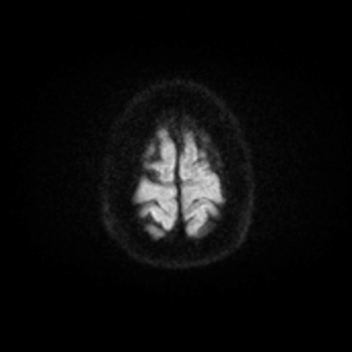
[im 47/47]
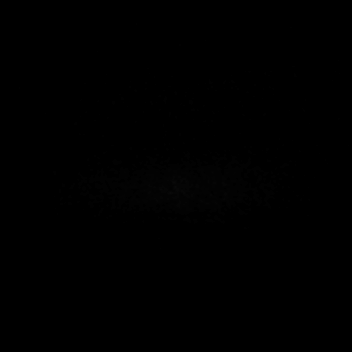

[Series 6: ax dwi_adc · axial · 3.0mm · 0.65mm/px · z∈[-97,+47]mm · 11 of 46 slices shown]
[im 1/46]
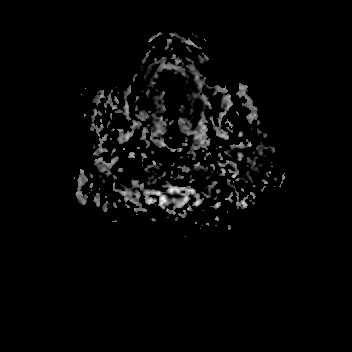
[im 5/46]
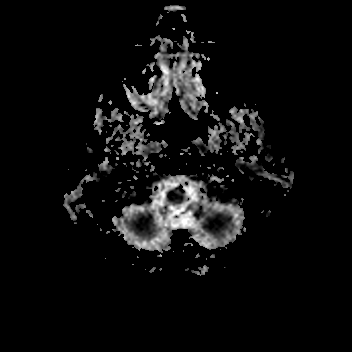
[im 10/46]
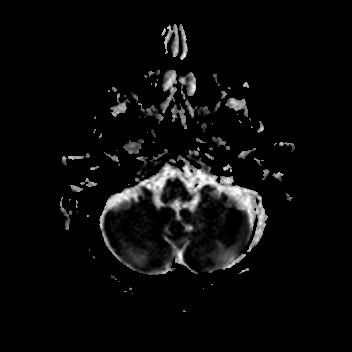
[im 14/46]
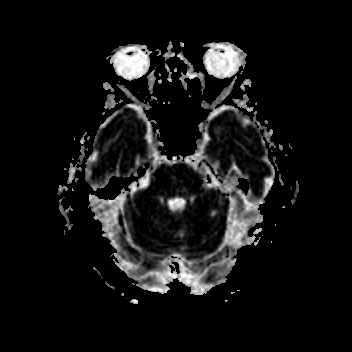
[im 19/46]
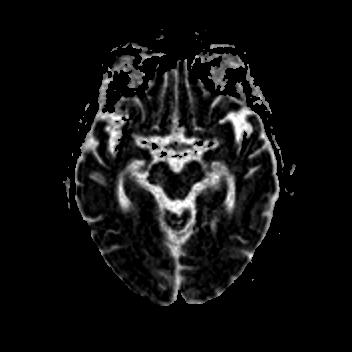
[im 23/46]
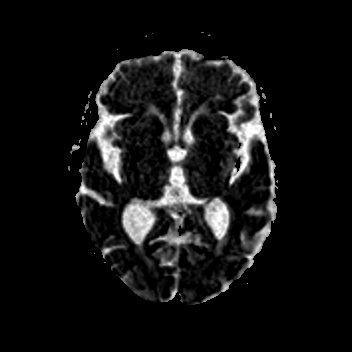
[im 28/46]
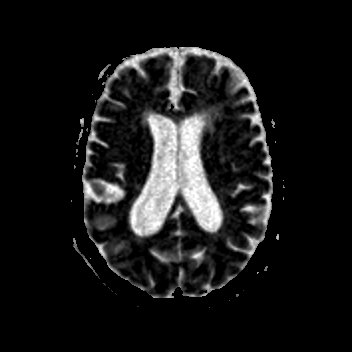
[im 32/46]
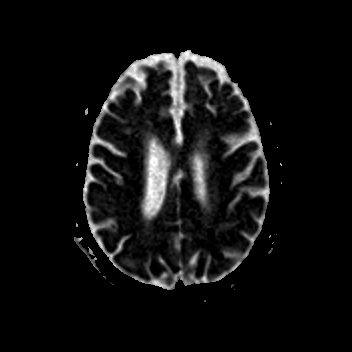
[im 37/46]
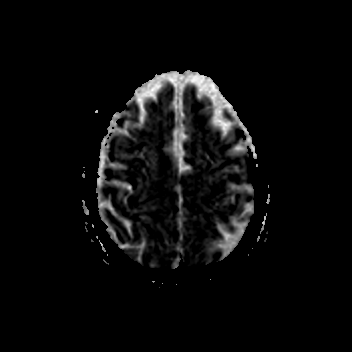
[im 41/46]
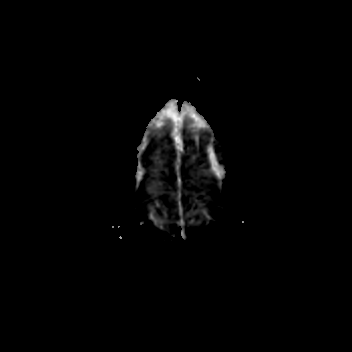
[im 46/46]
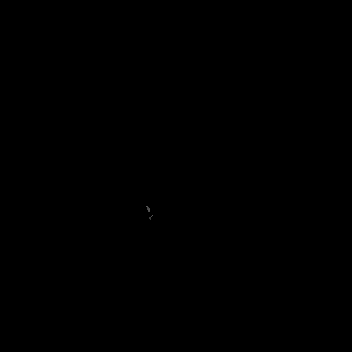

[Series 7: cor dwi_tracew · coronal · 5.0mm · 0.60mm/px · 5 of 36 slices shown]
[im 1/36]
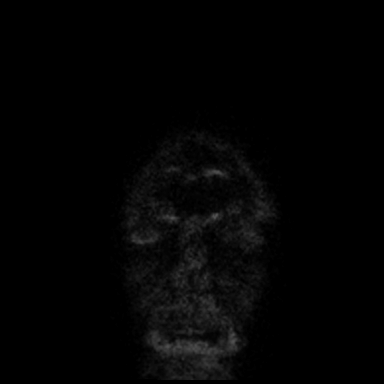
[im 6/36]
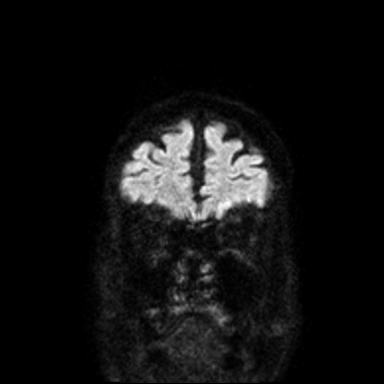
[im 11/36]
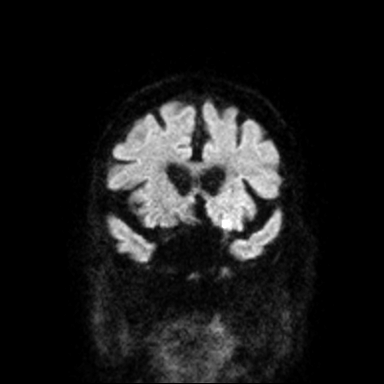
[im 16/36]
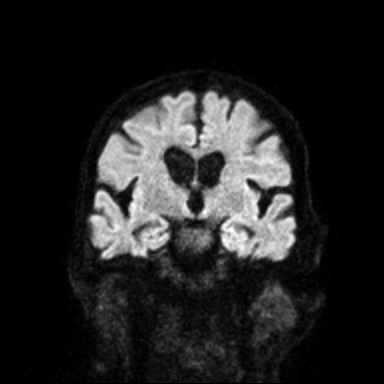
[im 21/36]
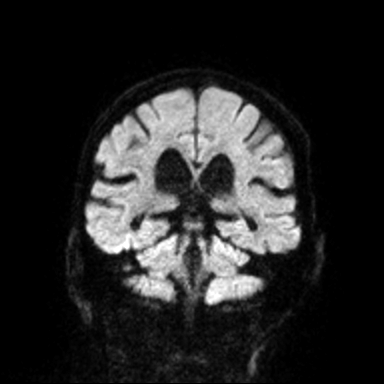

[Series 9: T1 · sagittal · 5.0mm · 0.62mm/px · 5 of 20 slices shown]
[im 1/20]
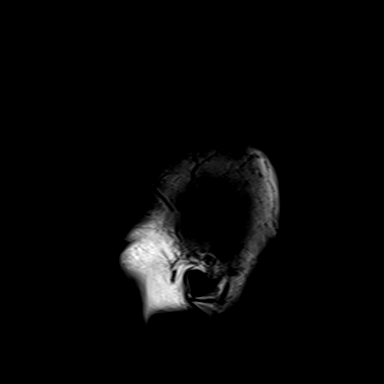
[im 5/20]
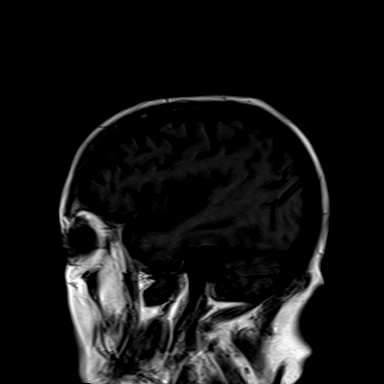
[im 10/20]
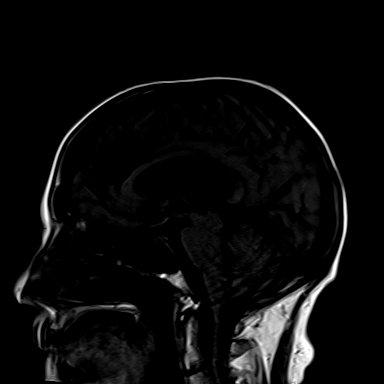
[im 15/20]
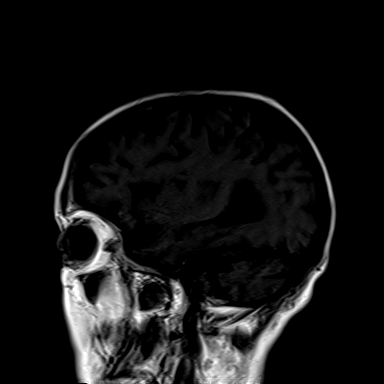
[im 20/20]
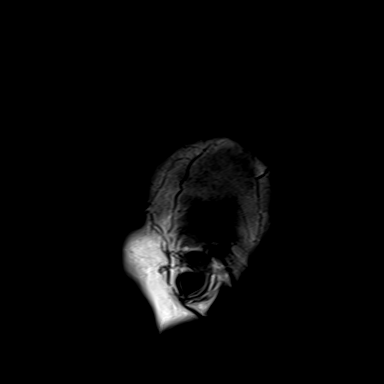

[Series 10: T2 · axial · 5.0mm · 0.53mm/px · z∈[-88,+47]mm · 6 of 24 slices shown]
[im 1/24]
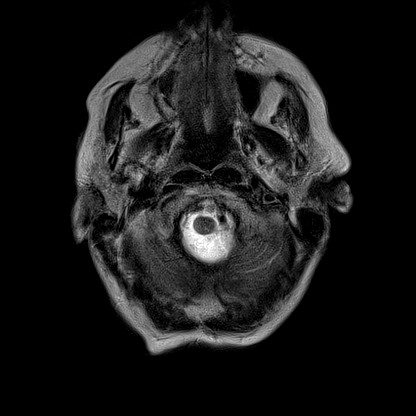
[im 5/24]
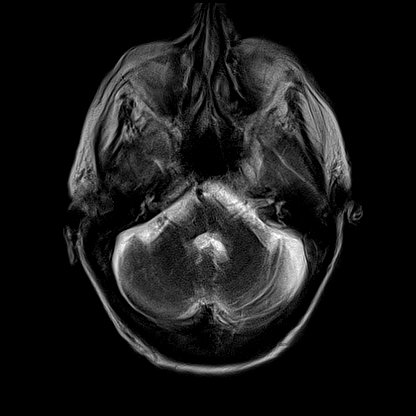
[im 10/24]
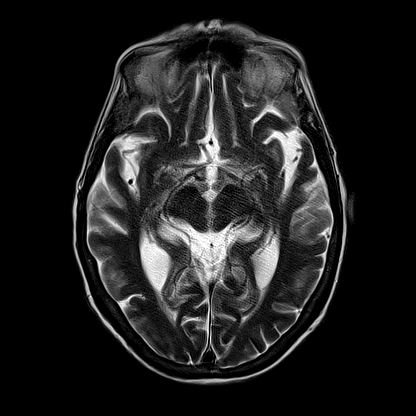
[im 14/24]
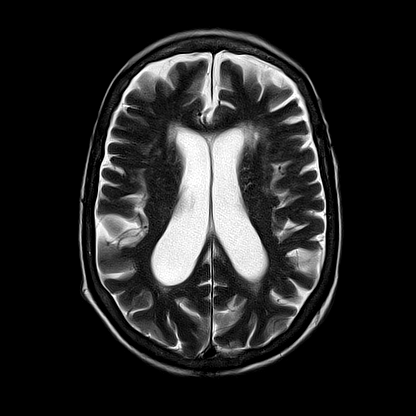
[im 19/24]
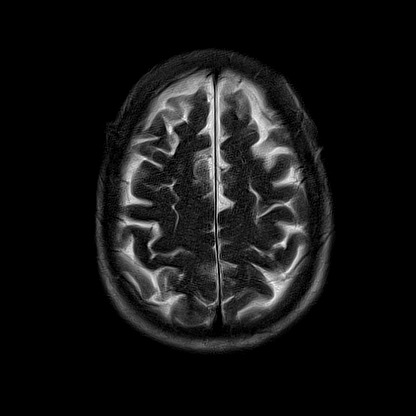
[im 24/24]
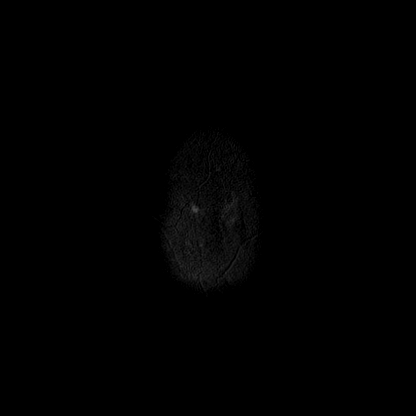

[35 of 48 positions shown; findings below may reference images not displayed]

FINDINGS: Brain: Examination technically limited as the patient was unable to
tolerate the full length of the exam. Diffusion-weighted sequences,
axial T2 weighted sequence, and sagittal T1 weighted sequence only
were performed. Additionally, provided images are degraded by
motion.

Age-related cerebral atrophy with chronic small vessel ischemic
disease. Scattered remote lacunar infarcts noted about the left
thalamus and cerebellum. No abnormal foci of restricted diffusion to
suggest acute or subacute ischemia. Gray-white matter
differentiation grossly maintained. No areas of chronic cortical
infarction.

No mass lesion or mass effect. No hydrocephalus or extra-axial fluid
collection. Pituitary gland suprasellar region normal. Midline
structures intact and normal informed.

Vascular: Major intracranial vascular flow voids are grossly
maintained.

Skull and upper cervical spine: Craniocervical junction within
normal limits. Bone marrow signal intensity normal. No scalp soft
tissue abnormality.

Sinuses/Orbits: Globes and orbital soft tissues grossly within
normal limits. Scattered mucosal thickening noted about the
ethmoidal air cells and maxillary sinuses. No significant mastoid
effusion.

Other: None.
IMPRESSION: 1. Technically limited exam due to patient's inability to tolerate
the full length of the exam.
2. No acute intracranial infarct or other abnormality.
3. Age-related cerebral atrophy with chronic small vessel ischemic
disease, with scattered remote lacunar infarcts about the left
thalamus and cerebellum.

## 2021-09-02 IMAGING — CT CT CHEST W/O CM
2 of 4 series · 15 of 36 positions shown, 18 images · non-contrast
Comparison: [DATE]

CLINICAL DATA: Pneumonia.



[Series 3: chest wo · axial · 0.67mm/px · z∈[+1051,+1315]mm · 12 of 158 slices shown, 15 images]
[im 13/158  mediastinal]
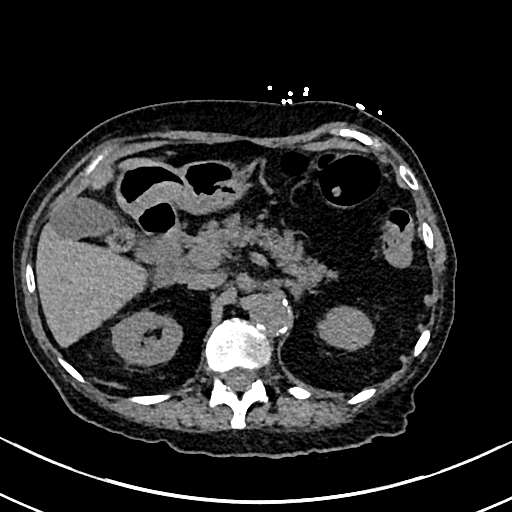
[im 13/158  lung]
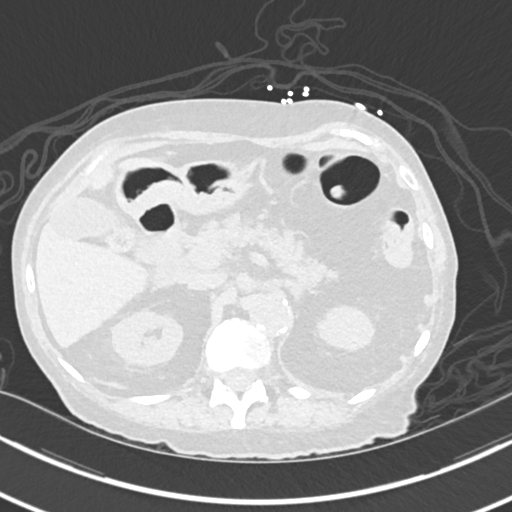
[im 25/158  lung]
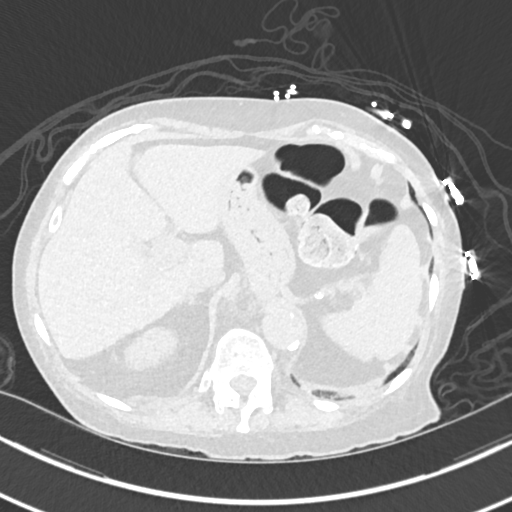
[im 37/158  lung]
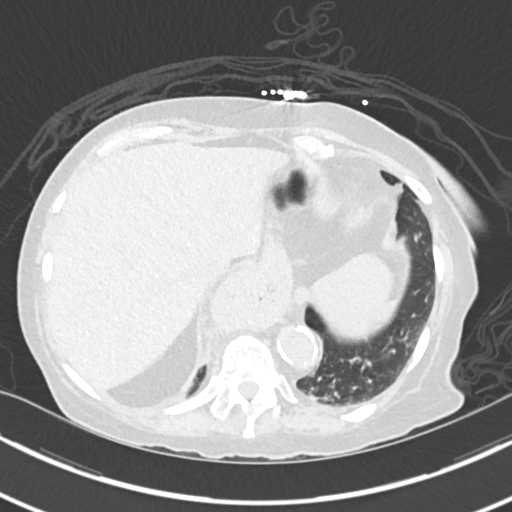
[im 49/158  lung]
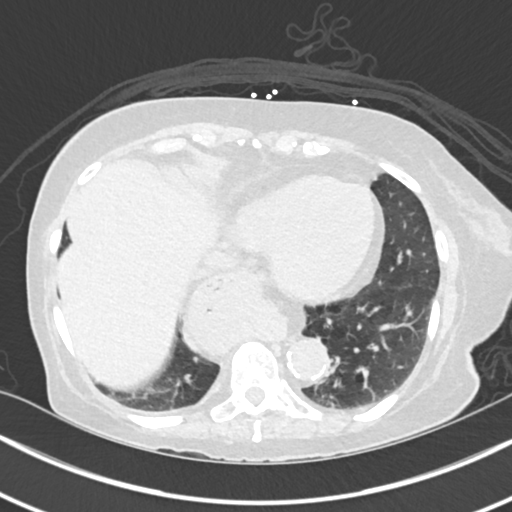
[im 61/158  mediastinal]
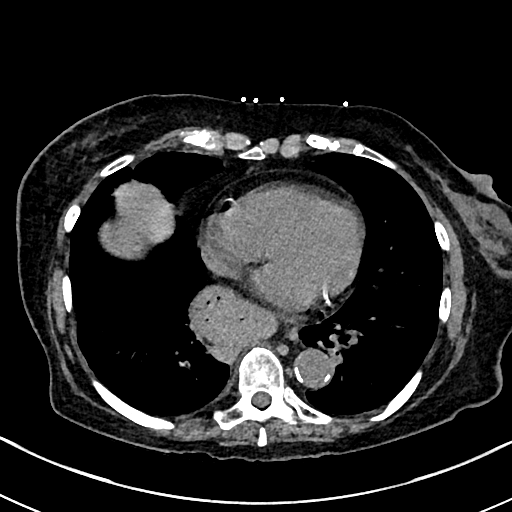
[im 61/158  lung]
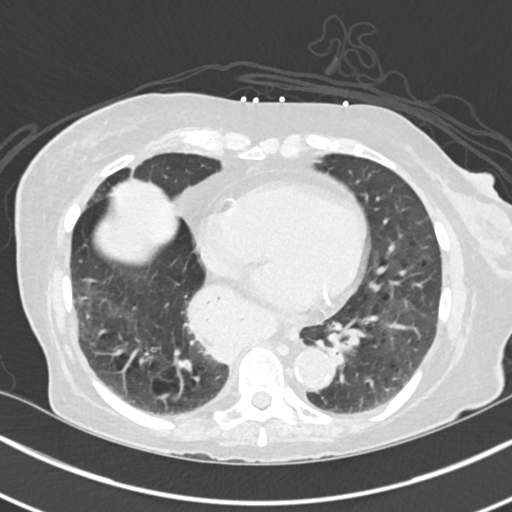
[im 73/158  lung]
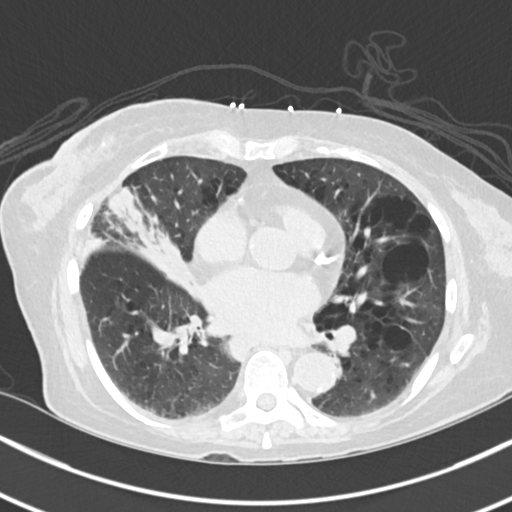
[im 85/158  lung]
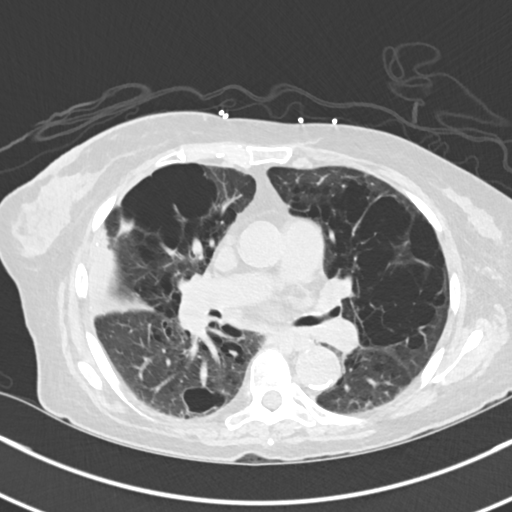
[im 97/158  lung]
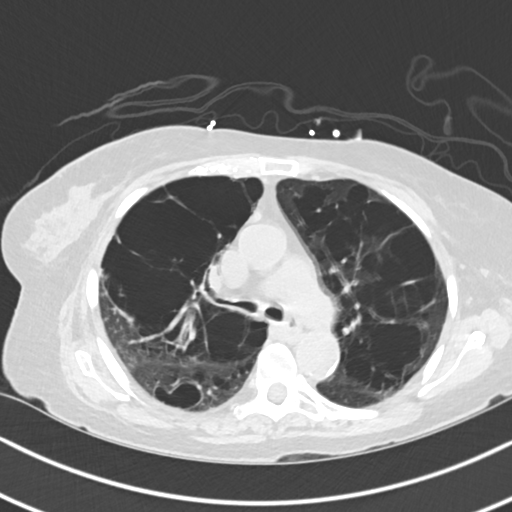
[im 109/158  mediastinal]
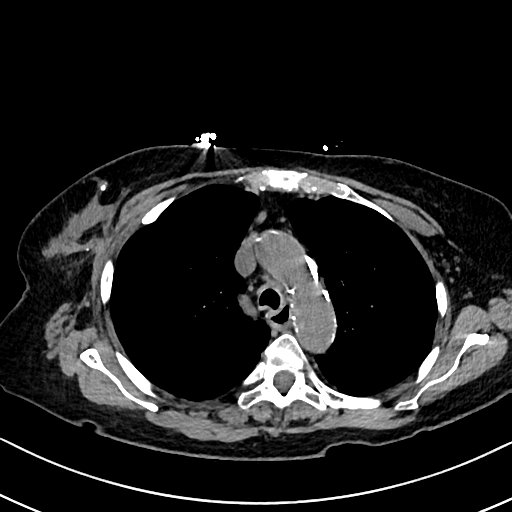
[im 109/158  lung]
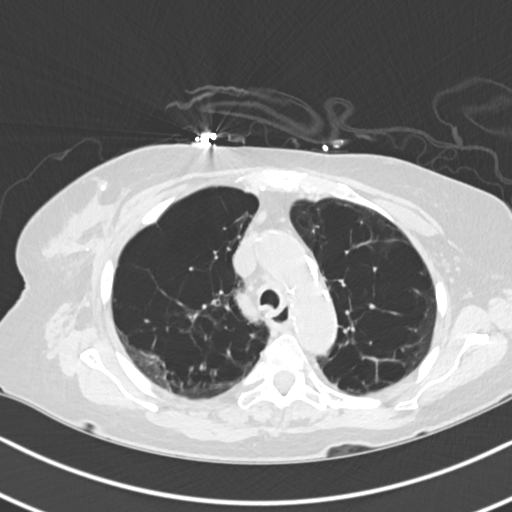
[im 121/158  lung]
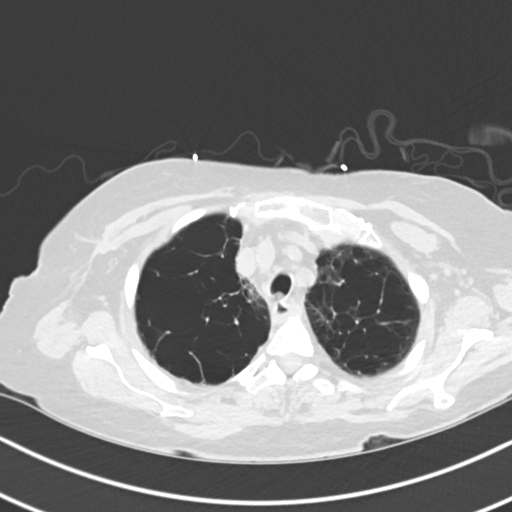
[im 133/158  lung]
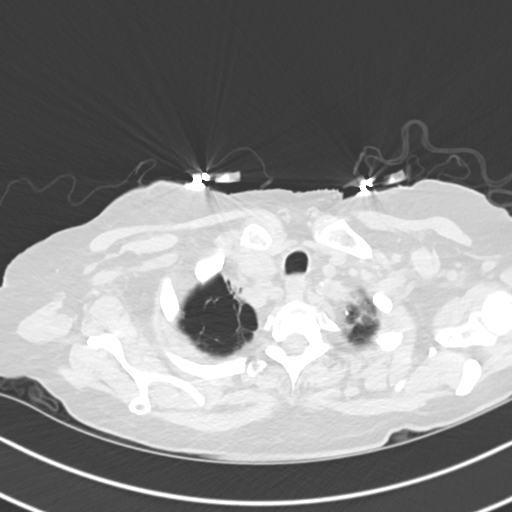
[im 145/158  lung]
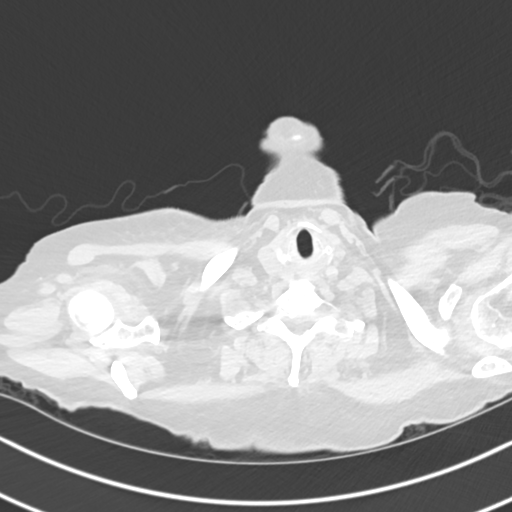

[Series 6: cor · coronal · 0.72mm/px · 3 of 125 slices shown]
[im 25/125  lung]
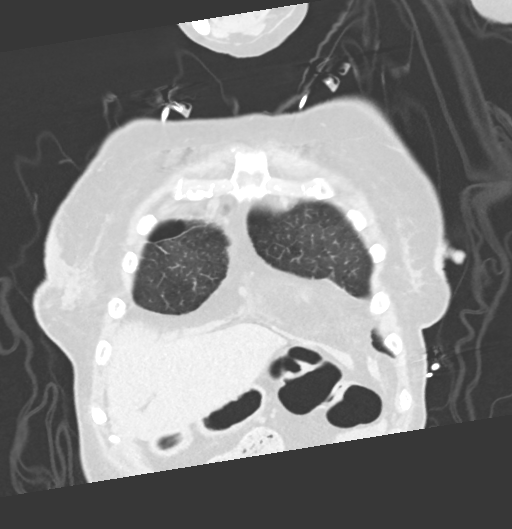
[im 50/125  lung]
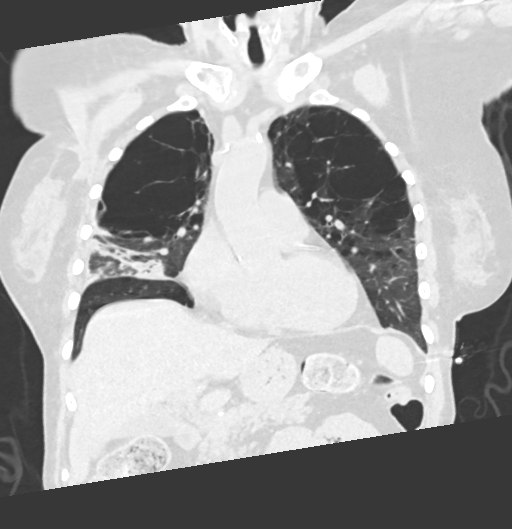
[im 75/125  lung]
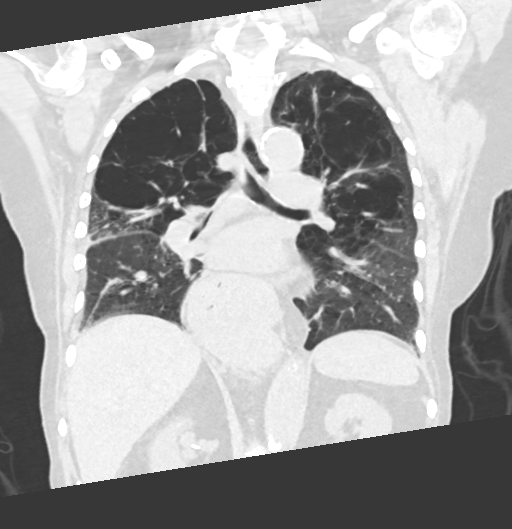

[15 of 36 positions shown; findings below may reference images not displayed]

FINDINGS: Cardiovascular: The heart is within normal limits in size for age.
No pericardial effusion. Stable tortuosity, ectasia calcification of
the thoracic aorta. No focal aneurysm. Stable three-vessel coronary
artery calcifications.

Mediastinum/Nodes: Stable borderline mediastinal hilar lymph nodes
likely inflammatory/reactive. No mass or overt adenopathy. Stable
large hiatal hernia.

Lungs/Pleura: Severe underlying lung disease with bullous emphysema.
Dense right middle lobe airspace consolidation consistent with
pneumonia. No left-sided pulmonary infiltrates. No pleural
effusions.

Upper Abdomen: No significant upper abdominal findings. Stable
aortic and branch vessel calcifications.

Musculoskeletal: No supraclavicular or axillary adenopathy. No
obvious breast masses. Suspect prior surgical changes involving
right breast. The bony thorax is intact. No worrisome bone lesions.
IMPRESSION: 1. Dense right middle lobe airspace consolidation consistent with
pneumonia.
2. Severe underlying lung disease with bullous emphysema.
3. Stable borderline mediastinal and hilar lymph nodes likely
inflammatory/reactive and due to the underlying lung disease.
4. Stable large hiatal hernia.
5. Stable atherosclerotic calcifications involving the aorta and
branch vessels including the coronary arteries.

Aortic Atherosclerosis ([1R]-[1R]) and Emphysema ([1R]-[1R]).

## 2021-09-02 MED ORDER — HEPARIN SODIUM (PORCINE) 5000 UNIT/ML IJ SOLN
5000.0000 [IU] | Freq: Two times a day (BID) | INTRAMUSCULAR | Status: DC
  Administered 2021-09-03 – 2021-09-05 (×6): 5000 [IU] via SUBCUTANEOUS
  Filled 2021-09-02 (×6): qty 1

## 2021-09-02 MED ORDER — FLUTICASONE PROPIONATE 50 MCG/ACT NA SUSP: 2.0000 | Freq: Two times a day (BID) | NASAL | Status: DC | PRN

## 2021-09-02 MED ORDER — CLOPIDOGREL BISULFATE 75 MG PO TABS
75.0000 mg | ORAL_TABLET | Freq: Every day | ORAL | Status: DC
Start: 2021-09-03 — End: 2021-09-02

## 2021-09-02 MED ORDER — HEPARIN SODIUM (PORCINE) 5000 UNIT/ML IJ SOLN: 5000.0000 [IU] | Freq: Three times a day (TID) | INTRAMUSCULAR | Status: DC

## 2021-09-02 MED ORDER — ONDANSETRON HCL 4 MG PO TABS: 4.0000 mg | ORAL_TABLET | Freq: Four times a day (QID) | ORAL | Status: DC | PRN

## 2021-09-02 MED ORDER — SODIUM CHLORIDE 0.9 % IV SOLN: INTRAVENOUS | Status: DC

## 2021-09-02 MED ORDER — HYDRALAZINE HCL 20 MG/ML IJ SOLN
10.0000 mg | Freq: Four times a day (QID) | INTRAMUSCULAR | Status: DC
  Administered 2021-09-04: 10 mg via INTRAVENOUS
  Filled 2021-09-02: qty 1

## 2021-09-02 MED ORDER — ONDANSETRON HCL 4 MG/2ML IJ SOLN
4.0000 mg | Freq: Four times a day (QID) | INTRAMUSCULAR | Status: DC | PRN
Start: 2021-09-02 — End: 2021-09-05

## 2021-09-02 MED ORDER — HYDRALAZINE HCL 20 MG/ML IJ SOLN: 10.0000 mg | Freq: Four times a day (QID) | INTRAMUSCULAR | Status: DC | PRN

## 2021-09-02 MED ORDER — DOCUSATE SODIUM 100 MG PO CAPS: 100.0000 mg | ORAL_CAPSULE | Freq: Two times a day (BID) | ORAL | Status: DC

## 2021-09-02 MED ORDER — ACETAMINOPHEN 650 MG RE SUPP: 650.0000 mg | Freq: Four times a day (QID) | RECTAL | Status: DC | PRN

## 2021-09-02 MED ORDER — CARVEDILOL 6.25 MG PO TABS
6.2500 mg | ORAL_TABLET | Freq: Two times a day (BID) | ORAL | Status: DC
Start: 2021-09-02 — End: 2021-09-02

## 2021-09-02 MED ORDER — SODIUM CHLORIDE 0.9 % IV SOLN
2.0000 g | Freq: Once | INTRAVENOUS | Status: AC
  Administered 2021-09-02: 2 g via INTRAVENOUS
  Filled 2021-09-02: qty 20

## 2021-09-02 MED ORDER — IPRATROPIUM-ALBUTEROL 0.5-2.5 (3) MG/3ML IN SOLN
3.0000 mL | Freq: Once | RESPIRATORY_TRACT | Status: AC
  Administered 2021-09-02: 3 mL via RESPIRATORY_TRACT
  Filled 2021-09-02: qty 3

## 2021-09-02 MED ORDER — ACETAMINOPHEN 325 MG PO TABS: 650.0000 mg | ORAL_TABLET | Freq: Four times a day (QID) | ORAL | Status: DC | PRN

## 2021-09-02 MED ORDER — AZITHROMYCIN 500 MG PO TABS
500.0000 mg | ORAL_TABLET | Freq: Once | ORAL | Status: DC
  Filled 2021-09-02: qty 1

## 2021-09-02 MED ORDER — ALBUTEROL SULFATE HFA 108 (90 BASE) MCG/ACT IN AERS: 2.0000 | INHALATION_SPRAY | Freq: Four times a day (QID) | RESPIRATORY_TRACT | Status: DC | PRN

## 2021-09-02 MED ORDER — DULOXETINE HCL 60 MG PO CPEP: 60.0000 mg | ORAL_CAPSULE | Freq: Every day | ORAL | Status: DC

## 2021-09-02 MED ORDER — GUAIFENESIN ER 600 MG PO TB12: 600.0000 mg | ORAL_TABLET | Freq: Two times a day (BID) | ORAL | Status: DC | PRN

## 2021-09-02 MED ORDER — METHYLPREDNISOLONE SODIUM SUCC 125 MG IJ SOLR
125.0000 mg | Freq: Once | INTRAMUSCULAR | Status: AC
Start: 2021-09-02 — End: 2021-09-02
  Administered 2021-09-02: 125 mg via INTRAVENOUS
  Filled 2021-09-02: qty 2

## 2021-09-02 MED ORDER — BISACODYL 5 MG PO TBEC: 5.0000 mg | DELAYED_RELEASE_TABLET | Freq: Every day | ORAL | Status: DC | PRN

## 2021-09-02 MED ORDER — DULOXETINE HCL 30 MG PO CPEP: 30.0000 mg | ORAL_CAPSULE | Freq: Every day | ORAL | Status: DC

## 2021-09-02 MED ORDER — HYDRALAZINE HCL 20 MG/ML IJ SOLN: 5.0000 mg | INTRAMUSCULAR | Status: DC | PRN

## 2021-09-02 MED ORDER — POLYETHYLENE GLYCOL 3350 17 G PO PACK: 17.0000 g | PACK | Freq: Every day | ORAL | Status: DC | PRN

## 2021-09-02 MED ORDER — ALBUTEROL SULFATE (2.5 MG/3ML) 0.083% IN NEBU: 2.5000 mg | INHALATION_SOLUTION | RESPIRATORY_TRACT | Status: DC | PRN

## 2021-09-02 NOTE — ED Notes (Signed)
Patient placed on 2 L O2 after drop in saturations noted. ?

## 2021-09-02 NOTE — ED Provider Notes (Signed)
? ?[facility] ?Provider Note ? ? ? Event Date/Time  ? First MD Initiated Contact with Patient 09/02/21 1459   ?  (approximate) ? ? ?History  ? ?Leg Swelling ? ? ?HPI ? ?Krista Cisneros is a 81 y.o. female with NSTEMI and 2019 status post PCI, COPD, hypertension, hyperlipidemia, breast cancer status post chemoradiation, cardiac catheterization in 2020 with stent placement who comes in with leg swelling and shortness of breath.  Patient has been on Lasix 20 mg for the past 1 week and has not had much improvement.  They had her scheduled for echocardiogram but her creatinine was 1.6 and she was told that if symptoms were to get worse that she needs to return to the emergency room for evaluation.  Family reports the leg swelling is gotten worse and her shortness of breath has gotten worse to the point where is difficult for her to lay flat otherwise she starts to feel short of breath.  Because of that she is needed to sleep in her recliner for the past few days.  She states that because of sleeping in the recliner she has not been sleeping as well and is more tired. ? ? ?I reviewed patient's cardiology note from 5/10 by Dr. Rockey Situ. ? ? ?Physical Exam  ? ?Triage Vital Signs: ?ED Triage Vitals  ?Enc Vitals Group  ?   BP 09/02/21 1458 123/69  ?   Pulse Rate 09/02/21 1458 83  ?   Resp 09/02/21 1458 20  ?   Temp 09/02/21 1458 97.8 ?F (36.6 ?C)  ?   Temp Source 09/02/21 1458 Oral  ?   SpO2 09/02/21 1456 95 %  ?   Weight 09/02/21 1459 141 lb 1.5 oz (64 kg)  ?   Height 09/02/21 1459 '5\' 2"'$  (1.575 m)  ?   Head Circumference --   ?   Peak Flow --   ?   Pain Score 09/02/21 1458 0  ?   Pain Loc --   ?   Pain Edu? --   ?   Excl. in [place]? --   ? ? ?Most recent vital signs: ?Vitals:  ? 09/02/21 1456 09/02/21 1458  ?BP:  123/69  ?Pulse:  83  ?Resp:  20  ?Temp:  97.8 ?F (36.6 ?C)  ?SpO2: 95% 95%  ? ? ? ?General: Awake, no distress.  ?CV:  Good peripheral perfusion.  ?Resp:  Normal effort.  Clear lungs ?Abd:  No  distention.  Soft and nontender ?Other:  Slight Edema noted in her bilateral ankles left slightly greater then the right.  ? ? ?ED Results / Procedures / Treatments  ? ?Labs ?(all labs ordered are listed, but only abnormal results are displayed) ?Labs Reviewed  ?CBC WITH DIFFERENTIAL/PLATELET  ?COMPREHENSIVE METABOLIC PANEL  ?BRAIN NATRIURETIC PEPTIDE  ?BLOOD GAS, VENOUS  ?TROPONIN I (HIGH SENSITIVITY)  ? ? ? ?EKG ? ?My interpretation of EKG: ? ?Normal sinus rhythm 75 without any ST elevation or T wave inversions, normal intervals ? ?RADIOLOGY ?I have reviewed and personally interpreted the xray with worsenign RLL pneumonia. ? ?IMPRESSION: ?Improved right middle and worsened right lower lobe airspace ?disease/pneumonia. Similar left base airspace disease. ?  ?Cardiomegaly without congestive failure. ?  ?Emphysema (ICD10-J43.9). ?  ?Hiatal hernia. ?  ? ? ?PROCEDURES: ? ?Critical Care performed: No ? ?.1-3 Lead EKG Interpretation ?Performed by: Vanessa Belton, MD ?Authorized by: Vanessa , MD  ? ?  Interpretation: normal   ?  ECG rate:  70 ?  ECG rate assessment: normal   ?  Rhythm: sinus rhythm   ?  Ectopy: none   ?  Conduction: normal   ? ? ?MEDICATIONS ORDERED IN ED: ?Medications  ?cefTRIAXone (ROCEPHIN) 2 g in sodium chloride 0.9 % 100 mL IVPB (has no administration in time range)  ?azithromycin (ZITHROMAX) tablet 500 mg (has no administration in time range)  ? ? ? ?IMPRESSION / MDM / ASSESSMENT AND PLAN / ED COURSE  ?I reviewed the triage vital signs and the nursing notes. ? ?Concern patient could have an acute exacerbation of her chronic COPD versus new diagnosis of CHF versus pneumonia versus pneumothorax.  Will get x-ray, labs to further evaluate.  Patient does have swelling noted in her legs left greater than right therefore will order ultrasound to make sure evidence of DVT ? ?Troponins are negative x2.  VBG without any evidence of hypercapnia.  CBC without any anemia or significant white count elevation.   CMP shows increasing creatinine.  BNP is normal. ? ?DVT ultrasounds are negative. ? ?Discussed the case with Dr. Rockey Situ who felt that most likely patient was being over diuresed and that her edema was more likely from venous stasis and that the Lasix was causing increased creatinine ? ?Given patient's bigger concern was her shortness of breath and the coughing I did get a CT scan to further evaluate patient does have pneumonia with underlying emphysema changes and lymph nodes.  We discussed admission versus discharge but given no improvement on the Levaquin we will give some antibiotics we will give a dose of steroids for the emphysema.  Will admit for AKI, pneumonia, emphysema. ? ? ?The patient is on the cardiac monitor to evaluate for evidence of arrhythmia and/or significant heart rate changes. ? ?  ? ? ?FINAL CLINICAL IMPRESSION(S) / ED DIAGNOSES  ? ?Final diagnoses:  ?Community acquired pneumonia, unspecified laterality  ?AKI (acute kidney injury) ([place])  ?Peripheral edema  ? ? ? ?Rx / DC Orders  ? ?ED Discharge Orders   ? ? None  ? ?  ? ? ? ?Note:  This document was prepared using Dragon voice recognition software and may include unintentional dictation errors. ?  ?Vanessa [place], MD ?09/02/21 1903 ? ?

## 2021-09-02 NOTE — Assessment & Plan Note (Addendum)
?   Continue isosorbide, beta-blocker, ACE/ARB, spironolactone as BP permits. ?

## 2021-09-02 NOTE — Assessment & Plan Note (Addendum)
?   PT/OT recommending HH ?

## 2021-09-02 NOTE — ED Triage Notes (Signed)
Pt arrives via ems from home, ems reports increase in leg swelling that has moved from her ankles up her legs bilaterally. Pt has been falling asleep in the middle of conversation, family states that she hasn't been sleeping at night due to sob and coughing. Pt was placed on 3L of O2 via [place] by ems due to their inability to get her pulse ox to read ?

## 2021-09-02 NOTE — Assessment & Plan Note (Addendum)
Restart home meds ?

## 2021-09-02 NOTE — H&P (Signed)
?History and Physical  ? ? ?Patient: Krista Cisneros ZMO:[id number] DOB: 16-Jun-1940 ?DOA: 09/02/2021 ?DOS: the patient was seen and examined on 09/02/2021 ?PCP: Sharyne Peach, MD  ?Patient coming from: Home ? ?Chief Complaint:  ?Chief Complaint  ?Patient presents with  ? Leg Swelling  ? ?HPI: Krista Cisneros is a 81 y.o. female with medical history significant of heart disease status post cardiac cath in 2020 with stent, chronic systolic congestive heart failure, COPD, GERD, breast cancer status post chemoradiation, hypertension presenting with cough and shortness of breath and leg swelling. ?Patient reportedly has been on Lasix 20 mg for the past week because of which her creatinine has risen. ?Daughter is not at bedside. ?PNA/ COPD. No hypoxia.  ?81 y/o SOB and coughing.On Levaquin completed recently. ?Orthopnea and cough.  ?Cards started on lasix with ? CHF. ?And has now has AKI. ?Echo pending.  ?HPI limited due to age although patient is alert awake and oriented perseverates with answering and physical exam is nonfocal however does not follow commands clearly are completely. ?Discussed with daughter on the phone if patient's speech has been a problem it is difficult to understand if patient is confabulating because of dementia or aphasic patient is certainly not dysarthric.  Daughter states that patient was seen about 2 weeks ago for fall at which time she noted that she had some difficulty in speech but since then she has declined as far as confusion and recall and not able to speak clearly not making sense.  Patient was seen on 08/26/2021 at which time patient had neuroimaging done which was negative for any acute bleed or acute stroke based on MRI. ?Chest x-ray at that time did show pneumonia was treated with Levaquin for UTI and pneumonia. ?Patient's presentation is certainly vague for her cough will admit for pneumonia . ? ? ?Review of Systems  ?Respiratory:  Positive for cough and shortness of  breath.   ?Cardiovascular:  Positive for leg swelling.  ? ?Past Medical History:  ?Diagnosis Date  ? Anxiety   ? Arthritis   ? CAD (coronary artery disease)   ? a. NSTEMI 12/19; b. LHC 04/10/18: pLAD 95%, mLAD 80%, m-dLCx 95%, OM3-1 lesion 40%, OM3-2 lesion 60%, mRCA 50%, EF 35-45%, successful PCI/DES x 2 to the LAD with recommended staged PCI of the LCx in a few weeks  ? Cancer [facility])   ? Right Breast Cancer  ? Chronic systolic CHF (congestive heart failure) ([place])   ? a. TTE 12/19: EF 30-35%, anteroseptal, anterior, and apical HK, Gr1DD, mild to mod MR, mildly dilated LA, RVSF nl  ? COPD (chronic obstructive pulmonary disease) ([facility])   ? Depression   ? Dyspnea   ? with exertion  ? GERD (gastroesophageal reflux disease)   ? Hyperlipidemia   ? Hypertension   ? Personal history of chemotherapy 2018  ? chemo prior to lumpectomy of right breast  ? Personal history of radiation therapy 2019  ? right breast ca  ? ?Past Surgical History:  ?Procedure Laterality Date  ? ABDOMINAL HYSTERECTOMY  1990  ? Partial  ? BREAST BIOPSY Right 10/04/2016  ? axilla lymph node (metastatic carcinoma) and axillay tail mass biopsy-invasive mammary carcinoma  ? BREAST LUMPECTOMY Right 03/21/2017  ? chemo first, [facility] and metastatic LN  ? BREAST LUMPECTOMY Right 05/21/2017  ? re-excision for clip  ? BREAST LUMPECTOMY WITH NEEDLE LOCALIZATION Right 03/21/2017  ? Procedure: BREAST LUMPECTOMY WITH NEEDLE LOCALIZATION;  Surgeon: Clayburn Pert, MD;  Location: [facility] ORS;  Service: General;  Laterality: Right;  ? CORONARY STENT INTERVENTION N/A 04/10/2018  ? Procedure: CORONARY STENT INTERVENTION;  Surgeon: Wellington Hampshire, MD;  Location: [place] [facility];  Service: Cardiovascular;  Laterality: N/A;  ? CORONARY STENT INTERVENTION N/A 05/14/2018  ? Procedure: CORONARY STENT INTERVENTION;  Surgeon: Nelva Bush, MD;  Location: [place] [facility];  Service: Cardiovascular;  Laterality: N/A;  ? DILATION AND CURETTAGE OF UTERUS    ? INGUINAL  HERNIA REPAIR Right 03/26/2017  ? Procedure: HERNIA REPAIR INGUINAL INCARCERATED;  Surgeon: Jules Husbands, MD;  Location: [facility] ORS;  Service: General;  Laterality: Right;  ? LEFT HEART CATH AND CORONARY ANGIOGRAPHY N/A 04/10/2018  ? Procedure: LEFT HEART CATH AND CORONARY ANGIOGRAPHY poss PCI;  Surgeon: Minna Merritts, MD;  Location: [place] [facility];  Service: Cardiovascular;  Laterality: N/A;  ? PORTA CATH REMOVAL N/A 12/06/2020  ? Procedure: PORTA CATH REMOVAL;  Surgeon: Algernon Huxley, MD;  Location: [place] [facility];  Service: Cardiovascular;  Laterality: N/A;  ? PORTACATH PLACEMENT Left 10/24/2016  ? Procedure: INSERTION PORT-A-CATH;  Surgeon: [name], MD;  Location: [facility] ORS;  Service: General;  Laterality: Left;  ? RE-EXCISION OF BREAST LUMPECTOMY Right 05/21/2017  ? Procedure: RE-EXCISION OF BREAST LUMPECTOMY;  Surgeon: Clayburn Pert, MD;  Location: [facility] OR[facility];  Service: General;  Laterality: Right;  ? SENTINEL NODE BIOPSY Right 03/21/2017  ? Procedure: SENTINEL NODE BIOPSY;  Surgeon: Clayburn Pert, MD;  Location: [facility] ORS;  Service: General;  Laterality: Right;  ? ?Social History:  reports that she quit smoking about 33 years ago. Her smoking use included cigarettes. She smoked an average of .5 packs per day. She has never used smokeless tobacco. She reports current alcohol use. She reports that she does not use drugs. ? ?Allergies  ?Allergen Reactions  ? Nsaids   ?  Bleeding risk  ? Penicillins Anaphylaxis, Swelling and Rash  ?  Has patient had a PCN reaction causing immediate rash, facial/tongue/throat swelling, SOB or lightheadedness with hypotension: Yes ?Has patient had a PCN reaction causing severe rash involving mucus membranes or skin necrosis: No ?Has patient had a PCN reaction that required hospitalization: No  ?Has patient had a PCN reaction occurring within the last 10 years: No ?If all of the above answers are "NO", then may proceed with Cephalosporin use. ?  ? Tamsulosin Nausea  Only  ? Atorvastatin Other (See Comments)  ?  Muscle Pain  ? Gabapentin Swelling  ? Ezetimibe Other (See Comments)  ?  Muscle pain  ? Aspirin   ?  GI bleeding risk  ? ? ?Family History  ?Problem Relation Age of Onset  ? Colon cancer Mother   ? Breast cancer Neg Hx   ? ? ?Prior to Admission medications   ?Medication Sig Start Date End Date Taking? Authorizing Provider  ?albuterol (PROVENTIL HFA;VENTOLIN HFA) 108 (90 Base) MCG/ACT inhaler Inhale 2 puffs into the lungs every 6 (six) hours as needed for wheezing or shortness of breath.    [provider]  ?baclofen (LIORESAL) 10 MG tablet Take 10 mg by mouth at bedtime. 01/14/17   [provider]  ?carvedilol (COREG) 6.25 MG tablet Take 6.25 mg by mouth 2 (two) times daily. 07/18/21   [provider]  ?cholecalciferol (VITAMIN D3) 25 MCG (1000 UT) tablet Take 1,000 Units by mouth daily.    [provider]  ?clopidogrel (PLAVIX) 75 MG tablet TAKE 1 TABLET (75 MG TOTAL) BY MOUTH DAILY WITH BREAKFAST. 10/05/20  Minna Merritts, MD  ?Coenzyme Q10 300 MG CAPS Take 300 mg by mouth daily.     [provider]  ?DULoxetine (CYMBALTA) 30 MG capsule Take 30 mg by mouth daily. (Take with '60mg'$  capsule to equal '90mg'$ )    [provider]  ?DULoxetine (CYMBALTA) 60 MG capsule Take 60 mg by mouth daily. (Take with '30mg'$  capsule to equal '90mg'$ )    [provider]  ?enalapril (VASOTEC) 5 MG tablet TAKE 1 TABLET (5 MG TOTAL) BY MOUTH DAILY. 03/22/21   Loel Dubonnet, NP  ?feeding supplement (ENSURE ENLIVE / ENSURE PLUS) LIQD Take 237 mLs by mouth daily. 11/17/20   Enzo Bi, MD  ?fexofenadine (ALLEGRA) 180 MG tablet Take 180 mg by mouth daily. 09/14/20   [provider]  ?fluticasone (FLONASE) 50 MCG/ACT nasal spray Place 2 sprays into the nose 2 (two) times daily as needed for allergies or rhinitis.    [provider]  ?fluticasone furoate-vilanterol (BREO ELLIPTA) 200-25 MCG/INH AEPB Inhale 1 puff into the  lungs daily.    [provider]  ?furosemide (LASIX) 20 MG tablet Take 1 tablet (20 mg total) by mouth daily as needed for edema. 10/04/20   Minna Merritts, MD  ?HYDROcodone bit-homatropine (HYCODAN) 5

## 2021-09-02 NOTE — Assessment & Plan Note (Addendum)
?   Improved/resolved to baseline. ?

## 2021-09-02 NOTE — Assessment & Plan Note (Addendum)
?   Cleared for p.o. intake, resumed home meds after BP improved 05/14 ?? Echo 05/13 stable EF 60-65% G1DDF ?

## 2021-09-02 NOTE — Assessment & Plan Note (Addendum)
Continue PPI ?

## 2021-09-02 NOTE — ED Notes (Signed)
Patient noted to be out of bed, walking around room.  RN asked patient what she needed and where she was trying to go.  Patient states "I really don't know."  Patient assisted to use bedside commode and returned to bed.  Bed alarm on and door to room open and in view of nursing station ?

## 2021-09-02 NOTE — Assessment & Plan Note (Addendum)
CT Chest 05/12: Dense right middle lobe airspace consolidation consistent with pneumonia. 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 ?? Continue ceftriaxone, received ceftriaxone and azithromycin in ED ?? O2 prn ?? Aspiration precautions ?? ST has seen patient, cleared for po intake   ?? Discharging on clindamycin given allergy to PCN precludes Augmentin Rx  ?

## 2021-09-02 NOTE — ED Notes (Signed)
Patient returned from MRI at this time.  

## 2021-09-02 NOTE — Assessment & Plan Note (Addendum)
CT Chest 05/12: Dense right middle lobe airspace consolidation consistent with pneumonia. 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 CT chest images personally reviewed with daughter, impressive bullous emphysema ?? treat for COPD exacerbation: solumedrol 40 mg IV daily, duonebs q6h scheduled, q2h prn ?? Discharging on steroid taper and continue home inhalers, duonebs prn  ?

## 2021-09-03 ENCOUNTER — Observation Stay (HOSPITAL_COMMUNITY)
Admit: 2021-09-03 | Discharge: 2021-09-03 | Disposition: A | Discharge status: Home / Self Care | Payer: Medicare Other | Attending: Internal Medicine | Admitting: Internal Medicine

## 2021-09-03 DIAGNOSIS — Z8616 Personal history of COVID-19: Secondary | ICD-10-CM | POA: Diagnosis not present

## 2021-09-03 DIAGNOSIS — N179 Acute kidney failure, unspecified: Secondary | ICD-10-CM

## 2021-09-03 DIAGNOSIS — J690 Pneumonitis due to inhalation of food and vomit: Secondary | ICD-10-CM | POA: Diagnosis present

## 2021-09-03 DIAGNOSIS — J189 Pneumonia, unspecified organism: Secondary | ICD-10-CM

## 2021-09-03 DIAGNOSIS — R410 Disorientation, unspecified: Secondary | ICD-10-CM | POA: Diagnosis present

## 2021-09-03 DIAGNOSIS — E785 Hyperlipidemia, unspecified: Secondary | ICD-10-CM | POA: Diagnosis present

## 2021-09-03 DIAGNOSIS — R600 Localized edema: Secondary | ICD-10-CM | POA: Diagnosis not present

## 2021-09-03 DIAGNOSIS — K219 Gastro-esophageal reflux disease without esophagitis: Secondary | ICD-10-CM | POA: Diagnosis present

## 2021-09-03 DIAGNOSIS — R296 Repeated falls: Secondary | ICD-10-CM | POA: Diagnosis present

## 2021-09-03 DIAGNOSIS — Z7902 Long term (current) use of antithrombotics/antiplatelets: Secondary | ICD-10-CM | POA: Diagnosis not present

## 2021-09-03 DIAGNOSIS — R4701 Aphasia: Secondary | ICD-10-CM | POA: Diagnosis present

## 2021-09-03 DIAGNOSIS — I130 Hypertensive heart and chronic kidney disease with heart failure and stage 1 through stage 4 chronic kidney disease, or unspecified chronic kidney disease: Secondary | ICD-10-CM | POA: Diagnosis present

## 2021-09-03 DIAGNOSIS — R0602 Shortness of breath: Secondary | ICD-10-CM | POA: Diagnosis present

## 2021-09-03 DIAGNOSIS — F419 Anxiety disorder, unspecified: Secondary | ICD-10-CM | POA: Diagnosis present

## 2021-09-03 DIAGNOSIS — N189 Chronic kidney disease, unspecified: Secondary | ICD-10-CM | POA: Diagnosis present

## 2021-09-03 DIAGNOSIS — R609 Edema, unspecified: Secondary | ICD-10-CM | POA: Diagnosis present

## 2021-09-03 DIAGNOSIS — J439 Emphysema, unspecified: Secondary | ICD-10-CM | POA: Diagnosis present

## 2021-09-03 DIAGNOSIS — M1990 Unspecified osteoarthritis, unspecified site: Secondary | ICD-10-CM | POA: Diagnosis present

## 2021-09-03 DIAGNOSIS — Z20822 Contact with and (suspected) exposure to covid-19: Secondary | ICD-10-CM | POA: Diagnosis present

## 2021-09-03 DIAGNOSIS — I25119 Atherosclerotic heart disease of native coronary artery with unspecified angina pectoris: Secondary | ICD-10-CM | POA: Diagnosis present

## 2021-09-03 DIAGNOSIS — I252 Old myocardial infarction: Secondary | ICD-10-CM | POA: Diagnosis not present

## 2021-09-03 DIAGNOSIS — K449 Diaphragmatic hernia without obstruction or gangrene: Secondary | ICD-10-CM | POA: Diagnosis present

## 2021-09-03 DIAGNOSIS — Z9183 Wandering in diseases classified elsewhere: Secondary | ICD-10-CM | POA: Diagnosis not present

## 2021-09-03 DIAGNOSIS — I5042 Chronic combined systolic (congestive) and diastolic (congestive) heart failure: Secondary | ICD-10-CM | POA: Diagnosis present

## 2021-09-03 DIAGNOSIS — Z79890 Hormone replacement therapy (postmenopausal): Secondary | ICD-10-CM | POA: Diagnosis not present

## 2021-09-03 DIAGNOSIS — F32A Depression, unspecified: Secondary | ICD-10-CM | POA: Diagnosis present

## 2021-09-03 DIAGNOSIS — R4182 Altered mental status, unspecified: Secondary | ICD-10-CM

## 2021-09-03 DIAGNOSIS — F01518 Vascular dementia, unspecified severity, with other behavioral disturbance: Secondary | ICD-10-CM | POA: Diagnosis present

## 2021-09-03 DIAGNOSIS — Z79811 Long term (current) use of aromatase inhibitors: Secondary | ICD-10-CM | POA: Diagnosis not present

## 2021-09-03 LAB — COMPREHENSIVE METABOLIC PANEL
ALT: 9 U/L (ref 0–44)
AST: 18 U/L (ref 15–41)
Albumin: 3.4 g/dL — ABNORMAL LOW (ref 3.5–5.0)
Alkaline Phosphatase: 51 U/L (ref 38–126)
Anion gap: 10 (ref 5–15)
BUN: 41 mg/dL — ABNORMAL HIGH (ref 8–23)
CO2: 26 mmol/L (ref 22–32)
Calcium: 9.4 mg/dL (ref 8.9–10.3)
Chloride: 106 mmol/L (ref 98–111)
Creatinine, Ser: 1.69 mg/dL — ABNORMAL HIGH (ref 0.44–1.00)
GFR, Estimated: 30 mL/min — ABNORMAL LOW (ref >60)
Glucose, Bld: 127 mg/dL — ABNORMAL HIGH (ref 70–99)
Potassium: 4.4 mmol/L (ref 3.5–5.1)
Sodium: 142 mmol/L (ref 135–145)
Total Bilirubin: 0.4 mg/dL (ref 0.3–1.2)
Total Protein: 7.6 g/dL (ref 6.5–8.1)

## 2021-09-03 LAB — PROCALCITONIN: Procalcitonin: <0.1 ng/mL

## 2021-09-03 LAB — TYPE AND SCREEN
ABO/RH(D): O POS
Antibody Screen: NEGATIVE

## 2021-09-03 LAB — HEMOGLOBIN A1C
Hgb A1c MFr Bld: 6.5 % — ABNORMAL HIGH (ref 4.8–5.6)
Mean Plasma Glucose: 139.85 mg/dL

## 2021-09-03 LAB — CBC
HCT: 41.1 % (ref 36.0–46.0)
Hemoglobin: 12.4 g/dL (ref 12.0–15.0)
MCH: 24.5 pg — ABNORMAL LOW (ref 26.0–34.0)
MCHC: 30.2 g/dL (ref 30.0–36.0)
MCV: 81.2 fL (ref 80.0–100.0)
Platelets: 363 10*3/uL (ref 150–400)
RBC: 5.06 MIL/uL (ref 3.87–5.11)
RDW: 18.3 % — ABNORMAL HIGH (ref 11.5–15.5)
WBC: 7.5 10*3/uL (ref 4.0–10.5)
nRBC: 0 % (ref 0.0–0.2)

## 2021-09-03 LAB — GLUCOSE, RANDOM
Glucose, Bld: 147 mg/dL — ABNORMAL HIGH (ref 70–99)
Glucose, Bld: 165 mg/dL — ABNORMAL HIGH (ref 70–99)

## 2021-09-03 LAB — ECHOCARDIOGRAM COMPLETE BUBBLE STUDY: S' Lateral: 2.9 cm

## 2021-09-03 LAB — MAGNESIUM: Magnesium: 2.3 mg/dL (ref 1.7–2.4)

## 2021-09-03 LAB — STREP PNEUMONIAE URINARY ANTIGEN: Strep Pneumo Urinary Antigen: NEGATIVE

## 2021-09-03 MED ORDER — FLUTICASONE FUROATE-VILANTEROL 200-25 MCG/ACT IN AEPB
1.0000 | INHALATION_SPRAY | Freq: Every day | RESPIRATORY_TRACT | Status: DC
  Administered 2021-09-04 – 2021-09-05 (×2): 1 via RESPIRATORY_TRACT
  Filled 2021-09-03: qty 28

## 2021-09-03 MED ORDER — PERFLUTREN LIPID MICROSPHERE
1.0000 mL | INTRAVENOUS | Status: AC | PRN
  Administered 2021-09-03: 3 mL via INTRAVENOUS
  Filled 2021-09-03: qty 10

## 2021-09-03 MED ORDER — BACLOFEN 10 MG PO TABS: 10.0000 mg | ORAL_TABLET | Freq: Every day | ORAL | Status: DC

## 2021-09-03 MED ORDER — ROSUVASTATIN CALCIUM 10 MG PO TABS
10.0000 mg | ORAL_TABLET | Freq: Every day | ORAL | Status: DC
  Administered 2021-09-03 – 2021-09-04 (×2): 10 mg via ORAL
  Filled 2021-09-03 (×2): qty 1

## 2021-09-03 MED ORDER — IPRATROPIUM-ALBUTEROL 0.5-2.5 (3) MG/3ML IN SOLN
3.0000 mL | Freq: Four times a day (QID) | RESPIRATORY_TRACT | Status: DC
Start: 2021-09-03 — End: 2021-09-04
  Administered 2021-09-03 (×2): 3 mL via RESPIRATORY_TRACT
  Filled 2021-09-03 (×2): qty 3

## 2021-09-03 MED ORDER — METRONIDAZOLE 500 MG/100ML IV SOLN
500.0000 mg | Freq: Two times a day (BID) | INTRAVENOUS | Status: DC
  Administered 2021-09-03: 500 mg via INTRAVENOUS
  Filled 2021-09-03 (×4): qty 100

## 2021-09-03 MED ORDER — MONTELUKAST SODIUM 10 MG PO TABS
10.0000 mg | ORAL_TABLET | Freq: Every day | ORAL | Status: DC
  Administered 2021-09-03 – 2021-09-04 (×2): 10 mg via ORAL
  Filled 2021-09-03 (×2): qty 1

## 2021-09-03 MED ORDER — CLOPIDOGREL BISULFATE 75 MG PO TABS
75.0000 mg | ORAL_TABLET | Freq: Every day | ORAL | Status: DC
  Administered 2021-09-03 – 2021-09-05 (×3): 75 mg via ORAL
  Filled 2021-09-03 (×3): qty 1

## 2021-09-03 MED ORDER — DULOXETINE HCL 30 MG PO CPEP
90.0000 mg | ORAL_CAPSULE | Freq: Every day | ORAL | Status: DC
Start: 2021-09-03 — End: 2021-09-05
  Administered 2021-09-03 – 2021-09-05 (×3): 90 mg via ORAL
  Filled 2021-09-03 (×3): qty 3

## 2021-09-03 MED ORDER — ISOSORBIDE MONONITRATE ER 30 MG PO TB24
15.0000 mg | ORAL_TABLET | Freq: Every day | ORAL | Status: DC
  Administered 2021-09-03 – 2021-09-05 (×3): 15 mg via ORAL
  Filled 2021-09-03 (×3): qty 1

## 2021-09-03 MED ORDER — METHYLPREDNISOLONE SODIUM SUCC 40 MG IJ SOLR
40.0000 mg | Freq: Every day | INTRAMUSCULAR | Status: DC
  Administered 2021-09-03 – 2021-09-05 (×3): 40 mg via INTRAVENOUS
  Filled 2021-09-03 (×3): qty 1

## 2021-09-03 MED ORDER — IPRATROPIUM-ALBUTEROL 0.5-2.5 (3) MG/3ML IN SOLN: 3.0000 mL | RESPIRATORY_TRACT | Status: DC | PRN

## 2021-09-03 MED ORDER — BACLOFEN 10 MG PO TABS
5.0000 mg | ORAL_TABLET | Freq: Every day | ORAL | Status: DC
  Administered 2021-09-03 – 2021-09-04 (×2): 5 mg via ORAL
  Filled 2021-09-03 (×2): qty 0.5

## 2021-09-03 MED ORDER — SODIUM CHLORIDE 0.9 % IV SOLN
2.0000 g | INTRAVENOUS | Status: DC
  Administered 2021-09-03 – 2021-09-04 (×2): 2 g via INTRAVENOUS
  Filled 2021-09-03: qty 2
  Filled 2021-09-03 (×2): qty 20

## 2021-09-03 MED ORDER — TIOTROPIUM BROMIDE MONOHYDRATE 18 MCG IN CAPS
18.0000 ug | ORAL_CAPSULE | Freq: Every day | RESPIRATORY_TRACT | Status: DC
  Administered 2021-09-04 – 2021-09-05 (×2): 18 ug via RESPIRATORY_TRACT
  Filled 2021-09-03: qty 5

## 2021-09-03 MED ORDER — METRONIDAZOLE 500 MG/100ML IV SOLN: 500.0000 mg | Freq: Three times a day (TID) | INTRAVENOUS | Status: DC

## 2021-09-03 MED ORDER — PANTOPRAZOLE SODIUM 40 MG PO TBEC
40.0000 mg | DELAYED_RELEASE_TABLET | Freq: Every day | ORAL | Status: DC
  Administered 2021-09-03 – 2021-09-05 (×3): 40 mg via ORAL
  Filled 2021-09-03 (×3): qty 1

## 2021-09-03 NOTE — ED Notes (Signed)
ECHO tech came and got RN advised IV unable to be used. RN assessed IV and was infiltrated. Removed.  ?

## 2021-09-03 NOTE — Evaluation (Addendum)
Clinical/Bedside Swallow Evaluation ?Patient Details  ?Name: Krista Cisneros ?MRN: [id number] ?Date of Birth: Jun 09, 1940 ? ?Today's Date: 09/03/2021 ?Time: SLP Start Time (ACUTE ONLY): 0900 SLP Stop Time (ACUTE ONLY): 0915 ?SLP Time Calculation (min) (ACUTE ONLY): 10 min ? ?Past Medical History:  ?Past Medical History:  ?Diagnosis Date  ? Anxiety   ? Arthritis   ? CAD (coronary artery disease)   ? a. NSTEMI 12/19; b. LHC 04/10/18: pLAD 95%, mLAD 80%, m-dLCx 95%, OM3-1 lesion 40%, OM3-2 lesion 60%, mRCA 50%, EF 35-45%, successful PCI/DES x 2 to the LAD with recommended staged PCI of the LCx in a few weeks  ? Cancer [facility])   ? Right Breast Cancer  ? Chronic systolic CHF (congestive heart failure) ([place])   ? a. TTE 12/19: EF 30-35%, anteroseptal, anterior, and apical HK, Gr1DD, mild to mod MR, mildly dilated LA, RVSF nl  ? COPD (chronic obstructive pulmonary disease) ([facility])   ? Depression   ? Dyspnea   ? with exertion  ? GERD (gastroesophageal reflux disease)   ? Hyperlipidemia   ? Hypertension   ? Personal history of chemotherapy 2018  ? chemo prior to lumpectomy of right breast  ? Personal history of radiation therapy 2019  ? right breast ca  ? ?Past Surgical History:  ?Past Surgical History:  ?Procedure Laterality Date  ? ABDOMINAL HYSTERECTOMY  1990  ? Partial  ? BREAST BIOPSY Right 10/04/2016  ? axilla lymph node (metastatic carcinoma) and axillay tail mass biopsy-invasive mammary carcinoma  ? BREAST LUMPECTOMY Right 03/21/2017  ? chemo first, [facility] and metastatic LN  ? BREAST LUMPECTOMY Right 05/21/2017  ? re-excision for clip  ? BREAST LUMPECTOMY WITH NEEDLE LOCALIZATION Right 03/21/2017  ? Procedure: BREAST LUMPECTOMY WITH NEEDLE LOCALIZATION;  Surgeon: Clayburn Pert, MD;  Location: [facility] ORS;  Service: General;  Laterality: Right;  ? CORONARY STENT INTERVENTION N/A 04/10/2018  ? Procedure: CORONARY STENT INTERVENTION;  Surgeon: Wellington Hampshire, MD;  Location: [place] [facility] LAB;  Service: Cardiovascular;   Laterality: N/A;  ? CORONARY STENT INTERVENTION N/A 05/14/2018  ? Procedure: CORONARY STENT INTERVENTION;  Surgeon: Nelva Bush, MD;  Location: [place] [facility] LAB;  Service: Cardiovascular;  Laterality: N/A;  ? DILATION AND CURETTAGE OF UTERUS    ? INGUINAL HERNIA REPAIR Right 03/26/2017  ? Procedure: HERNIA REPAIR INGUINAL INCARCERATED;  Surgeon: Jules Husbands, MD;  Location: [facility] ORS;  Service: General;  Laterality: Right;  ? LEFT HEART CATH AND CORONARY ANGIOGRAPHY N/A 04/10/2018  ? Procedure: LEFT HEART CATH AND CORONARY ANGIOGRAPHY poss PCI;  Surgeon: Minna Merritts, MD;  Location: [place] [facility] LAB;  Service: Cardiovascular;  Laterality: N/A;  ? PORTA CATH REMOVAL N/A 12/06/2020  ? Procedure: PORTA CATH REMOVAL;  Surgeon: Algernon Huxley, MD;  Location: [place] [facility];  Service: Cardiovascular;  Laterality: N/A;  ? PORTACATH PLACEMENT Left 10/24/2016  ? Procedure: INSERTION PORT-A-CATH;  Surgeon: [name], MD;  Location: [facility] ORS;  Service: General;  Laterality: Left;  ? RE-EXCISION OF BREAST LUMPECTOMY Right 05/21/2017  ? Procedure: RE-EXCISION OF BREAST LUMPECTOMY;  Surgeon: Clayburn Pert, MD;  Location: [facility] ORS;  Service: General;  Laterality: Right;  ? SENTINEL NODE BIOPSY Right 03/21/2017  ? Procedure: SENTINEL NODE BIOPSY;  Surgeon: Clayburn Pert, MD;  Location: [facility] ORS;  Service: General;  Laterality: Right;  ? ?HPI:  ?Krista Cisneros is a 81 y.o. female with medical history significant of heart disease status post cardiac cath in 2020 with stent, chronic systolic congestive  heart failure, COPD, GERD, breast cancer status post chemoradiation, hypertension presenting with cough and shortness of breath and leg swelling. Patient was seen on 08/26/2021 at which time patient had neuroimaging done which was negative for any acute bleed or acute stroke based on MRI. Current MRI found Technically limited exam due to patient's inability to tolerate  the full length of the exam. 2. No  acute intracranial infarct or other abnormality. 3. Age-related cerebral atrophy with chronic small vessel ischemic disease, with scattered remote lacunar infarcts about the left thalamus and cerebellum.  ?  ?Assessment / Plan / Recommendation  ?Clinical Impression ? Pt failed [employer] Screen d/t stopped drinking while consuming 3 oz water.  Pt seen at bedside and repositioned for safe consumption of POs. Pt with cup at bedside with straw, therefore had pt consume water via straw. Pt consumed at an appropriate rate without any overt s/s of aspiration. Pt's vitals remained stable with clear vocal quality. Swallow initiation appeared swift. Pt also observed with salines and while her oral phase appeared functional, she required continual reminders to remain upright in bed. At this time, pt appears appropriate for regular diet with thin liquids via straw, medicine whole with thin liquids as tolerated.  ? ?Pt tends to slide down in bed, please make sure pt is upright when consuming POs.  ? ?Skilled ST intervention is not indicated at this time.  ? ?SLP Visit Diagnosis: Dysphagia, unspecified (R13.10) ?   ?Aspiration Risk ? No limitations  ?  ?Diet Recommendation Regular;Thin liquid  ? ?Liquid Administration via: Cup ?Medication Administration: Whole meds with liquid ?Supervision: Patient able to self feed ?Compensations: Minimize environmental distractions;Slow rate;Small sips/bites ?Postural Changes: Seated upright at 90 degrees;Remain upright for at least 30 minutes after po intake  ?  ?Other  Recommendations Oral Care Recommendations: Oral care BID   ? ?Recommendations for follow up therapy are one component of a multi-disciplinary discharge planning process, led by the attending physician.  Recommendations may be updated based on patient status, additional functional criteria and insurance authorization. ? ?Follow up Recommendations No SLP follow up  ? ? ?  ?Assistance Recommended at Discharge None  ?Functional  Status Assessment Patient has not had a recent decline in their functional status  ?Frequency and Duration    ? N/A ?  ?   ? ?Prognosis   N/A ? ?  ? ?Swallow Study   ?General Date of Onset: 09/02/21 ?HPI: Devin Foskey is a 81 y.o. female with medical history significant of heart disease status post cardiac cath in 2020 with stent, chronic systolic congestive heart failure, COPD, GERD, breast cancer status post chemoradiation, hypertension presenting with cough and shortness of breath and leg swelling. Patient was seen on 08/26/2021 at which time patient had neuroimaging done which was negative for any acute bleed or acute stroke based on MRI. Current MRI found Technically limited exam due to patient's inability to tolerate  the full length of the exam. 2. No acute intracranial infarct or other abnormality. 3. Age-related cerebral atrophy with chronic small vessel ischemic disease, with scattered remote lacunar infarcts about the left thalamus and cerebellum. ?Type of Study: Bedside Swallow Evaluation ?Previous Swallow Assessment: none in chart ?Diet Prior to this Study: NPO ?Temperature Spikes Noted: No ?Respiratory Status: Room air ?History of Recent Intubation: No ?Behavior/Cognition: Alert;Pleasant mood ?Oral Cavity Assessment: Within Functional Limits ?Oral Care Completed by SLP: No ?Vision: Functional for self-feeding ?Self-Feeding Abilities: Able to feed self ?Patient Positioning: Upright in bed ?Baseline  Vocal Quality: Normal ?Volitional Cough: Strong ?Volitional Swallow: Able to elicit  ?  ?Oral/Motor/Sensory Function Overall Oral Motor/Sensory Function: Within functional limits   ?Ice Chips Ice chips: Not tested   ?Thin Liquid Thin Liquid: Within functional limits ?Presentation: Self Fed (via coke can)  ?  ?Nectar Thick Nectar Thick Liquid: Not tested   ?Honey Thick Honey Thick Liquid: Not tested   ?Puree Puree: Within functional limits   ?Solid ? ? ?  Solid: Within functional limits  ? ?  ?Zella Dewan B.  Rutherford Nail, M.S., CCC-SLP, CBIS ?Speech-Language Pathologist ?Rehabilitation Services ?Office [phone] ? ?Lino Wickliff ?09/03/2021,9:47 AM ? ? ? ?

## 2021-09-03 NOTE — Assessment & Plan Note (Addendum)
?   MRI on this admission does not show acute CVA, however evidence of prior CVA and microvascular ischemic change, certainly could be component of vascular dementia that would explain her progressive worsening of mental status over the past 3 weeks per family, could also be complicated by aspiration pneumonia recently, I have some concern for hypoxia with ambulation as well given her significant COPD ?? Consider neurology evaluation, at this point will trial treating underlying pneumonia and if patient continues to decline will involve specialist ?? Restarted Plavix, statin ?

## 2021-09-03 NOTE — ED Notes (Signed)
Patient assisted to ambulate to and from bedside commode.  Patient appears to have intermittent confusion.   ?

## 2021-09-03 NOTE — ED Notes (Signed)
Pt ate lunch.

## 2021-09-03 NOTE — ED Notes (Signed)
RN to bedside. Pt halfway out of bed. Pt redirected back to bed.  ?

## 2021-09-03 NOTE — Evaluation (Signed)
Physical Therapy Evaluation ?Patient Details ?Name: Krista Cisneros ?MRN: [id number] ?DOB: 1941-01-03 ?Today's Date: 09/03/2021 ? ?History of Present Illness ? presented to ER secondary to bilat LE edema; admitted for management of PNA (aspiration?).  Per chart review, family endorses fall approx 2 weeks prior to this admission with increase in confusion, speech since.  ?Clinical Impression ? Patient resting in bed upon arrival to room.  Unable to state name, but does reference [place] as location, states month and year.  However, in same conversation, references being at home and exhibits global confusion, active hallucinations (speaking to person not in room, perseverating on moving "that container"-sharps box).  No clinical indicators of pain noted.  Bilat UE/LE strength and ROM grossly symmetrical and WFL; no focal weakness appreciated.  Able to complete bed mobility with min assist; sit/stand, basic transfers and gait (50') with Rw, min assist.  Demonstrates short, shuffling steps with limited step height/length; maintains RW arms length anterior to patient.  Very easily distractible by external environment; often requires manual assist for RW advancement throughout gait trial to maintain stepping pattern.  Will plan to trial gait without assist device next session. ?Would benefit from skilled PT to address above deficits and promote optimal return to PLOF.; Recommend transition to HHPT upon discharge from acute hospitalization.  Do recommend constant/24/7 supervision for safety purposes at this time due to cognitive deficits.  ?    ?   ? ?Recommendations for follow up therapy are one component of a multi-disciplinary discharge planning process, led by the attending physician.  Recommendations may be updated based on patient status, additional functional criteria and insurance authorization. ? ?Follow Up Recommendations Home health PT ? ?  ?Assistance Recommended at Discharge Frequent or constant  Supervision/Assistance  ?Patient can return home with the following ? A little help with walking and/or transfers;A little help with bathing/dressing/bathroom;Assistance with cooking/housework;Direct supervision/assist for medications management;Assist for transportation;Direct supervision/assist for financial management;Help with stairs or ramp for entrance ? ?  ?Equipment Recommendations    ?Recommendations for Other Services ?    ?  ?Functional Status Assessment Patient has had a recent decline in their functional status and demonstrates the ability to make significant improvements in function in a reasonable and predictable amount of time.  ? ?  ?Precautions / Restrictions Precautions ?Precautions: Fall ?Restrictions ?Weight Bearing Restrictions: No  ? ?  ? ?Mobility ? Bed Mobility ?Overal bed mobility: Needs Assistance ?Bed Mobility: Supine to Sit, Sit to Supine ?  ?  ?Supine to sit: Supervision ?Sit to supine: Supervision ?  ?  ?  ? ?Transfers ?Overall transfer level: Needs assistance ?Equipment used: Rolling walker (2 wheels) ?Transfers: Sit to/from Stand ?Sit to Stand: Min guard ?  ?  ?  ?  ?  ?General transfer comment: cuing for hand placement ?  ? ?Ambulation/Gait ?Ambulation/Gait assistance: Min guard ?Gait Distance (Feet): 50 Feet ?Assistive device: Rolling walker (2 wheels) ?  ?  ?  ?  ?General Gait Details: short, shuffling steps with limited step height/length; maintains RW arms length anterior to patient.  Very easily distractible by external environment; often requires manual assist for RW advancement throughout gait trial to maintain stepping pattern ? ?Stairs ?  ?  ?  ?  ?  ? ?Wheelchair Mobility ?  ? ?Modified Rankin (Stroke Patients Only) ?  ? ?  ? ?Balance Overall balance assessment: Needs assistance ?Sitting-balance support: No upper extremity supported, Feet supported ?Sitting balance-Leahy Scale: Good ?  ?  ?Standing balance support:  Bilateral upper extremity supported ?Standing  balance-Leahy Scale: Fair ?Standing balance comment: standing functional reach approx 4-5", does indep seek external stabilization as needed ?  ?  ?  ?  ?  ?  ?  ?  ?  ?  ?  ?   ? ? ? ?Pertinent Vitals/Pain Pain Assessment ?Pain Assessment: No/denies pain  ? ? ?Home Living Family/patient expects to be discharged to:: Private residence ?Living Arrangements: Children ?Available Help at Discharge: Family;Available 24 hours/day;Available PRN/intermittently ?Type of Home: House ?Home Access: Stairs to enter ?Entrance Stairs-Rails: Right ?Entrance Stairs-Number of Steps: 3 ?Alternate Level Stairs-Number of Steps: 14 ?Home Layout: Two level;Bed/bath upstairs ?  ?Additional Comments: Patient unable to provide information; family not present.  Information taken from previous documentation-will verify with family as available.  Patient lives with daughter and son in law. Her bed/bath are on 2nd floor.  ?  ?Prior Function   ?  ?  ?  ?  ?  ?  ?  ?  ?  ? ? ?Hand Dominance  ?   ? ?  ?Extremity/Trunk Assessment  ? Upper Extremity Assessment ?Upper Extremity Assessment: Overall WFL for tasks assessed (grossly at least 4/5 throughout) ?  ? ?Lower Extremity Assessment ?Lower Extremity Assessment: Overall WFL for tasks assessed (grossly at least 4/5 throughout) ?  ? ?   ?Communication  ?    ?Cognition Arousal/Alertness: Awake/alert ?Behavior During Therapy: Restless ?Overall Cognitive Status: No family/caregiver present to determine baseline cognitive functioning ?  ?  ?  ?  ?  ?  ?  ?  ?  ?  ?  ?  ?  ?  ?  ?  ?General Comments: Unable to state name when asked; does reference [place] as location, states month and year. However, in same conversation references being at home.  Very perseverative in speech at times; notable hallucinations and global confusion obvious throughout session. ?  ?  ? ?  ?General Comments   ? ?  ?Exercises    ? ?Assessment/Plan  ?  ?PT Assessment Patient needs continued PT services  ?PT Problem List  Decreased strength;Decreased range of motion;Decreased activity tolerance;Decreased balance;Decreased mobility;Decreased coordination;Decreased cognition;Decreased knowledge of use of DME;Decreased safety awareness;Decreased knowledge of precautions ? ?   ?  ?PT Treatment Interventions Gait training;Stair training;Functional mobility training;DME instruction;Therapeutic activities;Therapeutic exercise;Balance training;Neuromuscular re-education;Cognitive remediation;Patient/family education   ? ?PT Goals (Current goals can be found in the Care Plan section)  ?Acute Rehab PT Goals ?Patient Stated Goal: to return home ?PT Goal Formulation: With patient ?Time For Goal Achievement: 09/17/21 ?Potential to Achieve Goals: Good ? ?  ?Frequency Min 2X/week ?  ? ? ?Co-evaluation   ?  ?  ?  ?  ? ? ?  ?AM-PAC PT "6 Clicks" Mobility  ?Outcome Measure Help needed turning from your back to your side while in a flat bed without using bedrails?: None ?Help needed moving from lying on your back to sitting on the side of a flat bed without using bedrails?: None ?Help needed moving to and from a bed to a chair (including a wheelchair)?: A Little ?Help needed standing up from a chair using your arms (e.g., wheelchair or bedside chair)?: A Little ?Help needed to walk in hospital room?: A Little ?Help needed climbing 3-5 steps with a railing? : A Little ?6 Click Score: 20 ? ?  ?End of Session Equipment Utilized During Treatment: Gait belt ?Activity Tolerance: Patient tolerated treatment well ?Patient left: in bed;with call bell/phone within  reach;with bed alarm set (OT at bedside) ?Nurse Communication: Mobility status ?PT Visit Diagnosis: Muscle weakness (generalized) (M62.81);Difficulty in walking, not elsewhere classified (R26.2) ?  ? ?Time: 1020-1041 ?PT Time Calculation (min) (ACUTE ONLY): 21 min ? ? ?Charges:   PT Evaluation ?$PT Eval Moderate Complexity: 1 Mod ?  ?  ?   ? ?Bryleigh Ottaway H. Owens Shark, PT, DPT, NCS ?09/03/21, 11:40  AM ?[phone] ? ? ?

## 2021-09-03 NOTE — Progress Notes (Signed)
?PROGRESS NOTE ? ? ? ?Krista Cisneros  VOH:[id number] DOB: [birth year]  ?DOA: 09/02/2021 ?Date of Service: 09/03/21 ?PCP: Sharyne Peach, MD ? ? ? ? ?Brief Narrative / Hospital Course:  ?Brought to ED 09/02/2021 with concern for lower extremity edema, increasing confusion.  Noted to have AKI, likely due to increased dose of Lasix at home.  Concern for increasing confusion, in ED patient received IV fluids, started on antibiotics for aspiration pneumonia given CT results showing right middle lobe pneumonia with significant bullous emphysema.  Po home meds held pending speech evaluation, which was later passed.  MRI brain showed no acute process but evidence of old infarcts, chronic microvascular change.  09/03/2021, echocardiogram pending, patient saturating well on room air.  Pending PT/OT evaluation, suspect will need home health and possibly home O2 on ambulation.  Continuing IV antibiotics for aspiration pneumonia.  [place] discussion with family, see A/P ? ?Consultants:  ?None ? ?Procedures: ?none ? ? ? ?Subjective: ?Patient not able to participate in history, she is disoriented, confused, speech is clear but she is not participating meaningfully in conversation.  History obtained from daughter and son-in-law who states she has been a bit more confused over the past 3 weeks.  She lives at home with them, is typically able to perform most ADLs independently but worsening SOB and confusion prompted them to bring her to the ED ? ? ? ? ?ASSESSMENT & PLAN: ?  ?Active Problems: ?  Essential hypertension ?  COPD (chronic obstructive pulmonary disease) ([place]) ?  GERD (gastroesophageal reflux disease) ?  Goals of care, counseling/discussion ?  Acute kidney injury superimposed on chronic kidney disease ([place]) ?  Coronary artery disease involving native coronary artery of native heart with angina pectoris ([place]) ?  Chronic diastolic CHF (congestive heart failure) ([place]) ?  Aspiration pneumonia ([place]) ?  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  Multiple falls ?  Altered mental state ? ? ? ?Aspiration pneumonia ([place]) ?CT Chest 05/12: Dense right middle lobe airspace consolidation consistent with pneumonia. 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 ?Continue ceftriaxone and metronidazole, received ceftriaxone and azithromycin in ED ?O2 prn ?Aspiration precautions ?ST has seen patient, cleared for po intake   ? ?COPD (chronic obstructive pulmonary disease) ([place]) ?CT Chest 05/12: Dense right middle lobe airspace consolidation consistent with pneumonia. 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 ?Chronic diastolic CHF (congestive heart failure) ([place]) ?Holding home beta-blocker, ARB/ACE, spironolactone for now given soft blood pressure ?Cleared for p.o. intake, if BP holds may consider restarting these home medications ? ?Essential hypertension ?Patient has been normotensive or even soft BP, will hold home meds ? ?GERD (gastroesophageal reflux disease) ?Continue PPI. ? ?Coronary artery disease involving native coronary artery of native heart with angina pectoris ([place]) ?Continued low-dose isosorbide ?Held other home medications given low BP, hopefully can restart beta-blocker, ACE/ARB, spironolactone as BP permits. ? ?Multiple falls ?PT/OT to assess likely needs at least home health. ? ?Acute kidney injury superimposed on chronic kidney disease ([place]) ?Hold nephrotoxic medications ?Monitor a.m. BMP ?Received IV fluids in ED, currently tolerating p.o. ? ?Altered mental state ?Most likely secondary to acute illness, some concern for progression of vascular dementia ?Aspiration precautions, fall precautions ?Consider neurology consultation if no better, MRI brain reassuring ? ?History of cerebrovascular accident (CVA) due to  ischemia ?MRI on  this admission does not show acute CVA, however evidence of prior CVA and microvascular ischemic change, certainly could be component of vascular dementia that would explain her progressive worsening of mental status over the past 3 weeks per family, could also be complicated by aspiration pneumonia recently, I have some concern for hypoxia with ambulation as well given her significant COPD ?Consider neurology evaluation, at this point will trial treating underlying pneumonia and if patient continues to decline will involve specialist ?Restarted Plavix, statin ? ?Goals of care, counseling/discussion ?Discussed with daughter and son-in-law in the ED 09/03/21, showed images from CT chest, discussed comorbidities: given significant pulmonary disease, cardiac disease, concern for advancing dementia, I counseled that I do not believe this patient would survive CPR/intubation, or if she did survive such treatments for cardiac/respiratory arrest she would be unlikely to ever come off life support and regain any meaningful QOL.  Daughter states she will consider this, I advised strongly consider DNR, daughter will think about it, will revisit conversation at a later time ? ?  ? ? ?DVT prophylaxis: heparin until AKI resolves ?Code Status: FULL -see assessment/plan regarding goals of care discussion ?Family Communication: Daughter and son-in-law at bedside in ED ?Disposition Plan / TOC needs: PT/OT to evaluate, patient lives at home and I suspect she will at least need home health services, possible oxygen ?Barriers to discharge / significant pending items: Medical improvement, continued IV antibiotics for aspiration pneumonia, treating for COPD exacerbation, altered mental status likely secondary to above versus progressing likely vascular dementia, high risk of decompensation ? ? ? ? ? ? ? ? ? ? ? ? ?Objective: ?Vitals:  ? 09/03/21 1000 09/03/21 1230 09/03/21 1300 09/03/21 1520  ?BP: (!) 110/58 118/69 (!)  129/97 138/69  ?Pulse: 83 94 94 87  ?Resp: '16 20 19 18  '$ ?Temp:    98.3 ?F (36.8 ?C)  ?TempSrc:    Oral  ?SpO2: 93% 93% 92% 94%  ?Weight:      ?Height:      ? ? ?Intake/Output Summary (Last 24 hours) at 09/03/2021 1556 ?Last data filed at 09/02/2021 2018 ?Gross per 24 hour  ?Intake 100 ml  ?Output --  ?Net 100 ml  ? ?Filed Weights  ? 09/02/21 1459  ?Weight: 64 kg  ? ? ?Examination:  ?Constitutional:  ?VS as above ?General Appearance: alert, well-nourished, NAD ?Eyes: ?Normal lids and conjunctive, non-icteric sclera ?Ears, Nose, Mouth, Throat: ?Normal appearance ?Neck: ?No masses, trachea midline ?Respiratory: ?Normal respiratory effort ?Breath sounds present but diminshed ?Cardiovascular: ?S1/S2 normal, no murmur/rub/gallop auscultated ?Trace lower extremity edema ?Gastrointestinal: ?Nontender, no masses ?No hernia appreciated ?Musculoskeletal:  ?No clubbing/cyanosis of digits ?Neurological: ?No cranial nerve deficit on limited exam ?Patient is alert, speech is clear, no focal motor deficits ?Patient is not oriented to person, place, time, situation ?Psychiatric: ?Lacking judgment/insight ?Calm mood and affect ? ? ? ? ? ? ?Scheduled Medications:  ? clopidogrel  75 mg Oral Daily  ? DULoxetine  90 mg Oral Daily  ? [START ON 09/04/2021] fluticasone furoate-vilanterol  1 puff Inhalation Daily  ? heparin injection (subcutaneous)  5,000 Units Subcutaneous Q12H  ? hydrALAZINE  10 mg Intravenous Q6H  ? ipratropium-albuterol  3 mL Nebulization Q6H  ? isosorbide mononitrate  15 mg Oral Daily  ? methylPREDNISolone (SOLU-MEDROL) injection  40 mg Intravenous Daily  ? montelukast  10 mg Oral QHS  ? pantoprazole  40 mg Oral Daily  ? rosuvastatin  10 mg Oral QHS  ? [START ON 09/04/2021] tiotropium  18  mcg Inhalation Daily  ? ? ?Continuous Infusions: ? sodium chloride Stopped (09/03/21 0813)  ? cefTRIAXone (ROCEPHIN)  IV    ? metronidazole    ? ? ?PRN Medications:  ?acetaminophen **OR** acetaminophen, bisacodyl, fluticasone,  ipratropium-albuterol, [DISCONTINUED] ondansetron **OR** ondansetron (ZOFRAN) IV ? ?Antimicrobials:  ?Anti-infectives (From admission, onward)  ? ? Start     Dose/Rate Route Frequency Ordered Stop  ? 09/03/21 2000  cefTRIA

## 2021-09-03 NOTE — Consult Note (Signed)
?Neurology Consultation ?Reason for Consult: Altered mental status ?Requesting Physician: Florina Ou ? ?CC: AMS ? ?History is obtained from: Daughter, granddaughter, patient and chart review  ? ?HPI: Krista Cisneros is a 81 y.o. female with a past medical history significant for hypertension, hyperlipidemia, coronary artery disease s/p stents (03/2018), COPD, breast cancer (s/p radiation and chemotherapy, currently on letrozole), prior cerebellar infarcts, remote smoking history. ? ?Recently her health problems have included COVID-19 infection (December 2021), bronchopneumonia in (07/07/4006), complicated by chronic cough ? ?Family reports that in the last few days she has had increased lower extremity swelling, and a great deal of difficulty sleeping as she is unable to lie flat and therefore has just been napping intermittently in her recliner.  In this setting she has been getting up in the middle of the night and doing inappropriate things like cleaning the house or packing for a trip that no one has actually plans to take.  Regarding her functional baseline, she moved into the house with her daughter many years ago when her husband passed away, but still manages her bills and was even driving until a few weeks ago when she was found to have cataracts on ophthalmology evaluation.  She was only driving short distances (1 to 3 miles to grocery stores or on small errands), but had not had any accidents or gotten lost.  She still cooks at home although family notes that for the past few years she will occasionally leave burners on and they have to monitor her cooking carefully.  She still pays her own bills and they do not think any bills have been missed.  She additionally manages her own medications.  They have noticed in the last few weeks in the evenings she will occasionally get confused and paranoid for example stating "robbers are here go get the guns" but this will be very brief and resolve after a few  minutes.  She has needed some help getting up the stairs.  In mid April she did have some falls at home that were mechanical (socks slipping on the floor).  She was seen by her PCP on 08/17/2021 for back pain after these falls, and her baclofen dose was increased from 10 mg nightly to 3 times daily.  Daughter reports she thinks the patient was taking it twice a day, but patient reports she was taking it 3 times a day and seems quite clear on this.  Lumbar spine x-rays demonstrated at the time multilevel lumbar spondylosis without acute process.  Family reports she is continent at baseline and there have been no recent concerns about her bowel or bladder habits.  She does occasionally need help getting up the stairs but otherwise she is fairly active. ? ?Additionally, she was seen by her pulmonologist on 07/18/2021 for stage II COPD with recurrent cough and was treated with a 10-day course of doxycycline, 7-day course of prednisone.  She was also evaluated by ophthalmology on 5/1 for evaluation of her cataracts and was noted to be somewhat disoriented with concern for early dementia. ? ?Additionally she had an episode of aphasia Aug 26, 2021, at which time symptoms of speech difficulty had been ongoing for 4 days.  MRI brain without contrast was negative for acute stroke, and she declined repeat MRI with contrast.  She was also found to have a UTI and treated with Levaquin, with improvement in her symptoms.  Subsequently she presented on 09/02/2021 (this admission) due to increasing leg swelling, somnolence and difficulty sleeping  at night due to shortness of breath and coughing (she has had to sleep in a recliner due to inability to lay flat).  She was found to have increasing creatinine.  She was felt to be getting over diuresed with edema potentially secondary to venous stasis and she was admitted for AKI, pneumonia and emphysema, and started on ceftriaxone.  Overnight the patient was noted to be delirious, wandering  around in her room aimlessly, removing her monitors gown and IV, repeatedly getting out of bed and at times being unable to answer name and date of birth ? ?ROS: Unable to obtain reliably due to altered mental status, but pertinent ROS in HPI as above.  ? ?Past Medical History:  ?Diagnosis Date  ? Anxiety   ? Arthritis   ? CAD (coronary artery disease)   ? a. NSTEMI 12/19; b. LHC 04/10/18: pLAD 95%, mLAD 80%, m-dLCx 95%, OM3-1 lesion 40%, OM3-2 lesion 60%, mRCA 50%, EF 35-45%, successful PCI/DES x 2 to the LAD with recommended staged PCI of the LCx in a few weeks  ? [facility])   ? Right Breast Cancer  ? Chronic systolic CHF (congestive heart failure) ([place])   ? a. TTE 12/19: EF 30-35%, anteroseptal, anterior, and apical HK, Gr1DD, mild to mod MR, mildly dilated LA, RVSF nl  ? COPD (chronic obstructive pulmonary disease) ([facility])   ? Depression   ? Dyspnea   ? with exertion  ? GERD (gastroesophageal reflux disease)   ? Hyperlipidemia   ? Hypertension   ? Personal history of chemotherapy 2018  ? chemo prior to lumpectomy of right breast  ? Personal history of radiation therapy 2019  ? right breast ca  ? ?Past Surgical History:  ?Procedure Laterality Date  ? ABDOMINAL HYSTERECTOMY  1990  ? Partial  ? BREAST BIOPSY Right 10/04/2016  ? axilla lymph node (metastatic carcinoma) and axillay tail mass biopsy-invasive mammary carcinoma  ? BREAST LUMPECTOMY Right 03/21/2017  ? chemo first, [facility] and metastatic LN  ? BREAST LUMPECTOMY Right 05/21/2017  ? re-excision for clip  ? BREAST LUMPECTOMY WITH NEEDLE LOCALIZATION Right 03/21/2017  ? Procedure: BREAST LUMPECTOMY WITH NEEDLE LOCALIZATION;  Surgeon: Clayburn Pert, MD;  Location: [facility] ORS;  Service: General;  Laterality: Right;  ? CORONARY STENT INTERVENTION N/A 04/10/2018  ? Procedure: CORONARY STENT INTERVENTION;  Surgeon: Wellington Hampshire, MD;  Location: [place] [facility];  Service: Cardiovascular;  Laterality: N/A;  ? CORONARY STENT INTERVENTION N/A 05/14/2018  ?  Procedure: CORONARY STENT INTERVENTION;  Surgeon: Nelva Bush, MD;  Location: [place] [facility];  Service: Cardiovascular;  Laterality: N/A;  ? DILATION AND CURETTAGE OF UTERUS    ? INGUINAL HERNIA REPAIR Right 03/26/2017  ? Procedure: HERNIA REPAIR INGUINAL INCARCERATED;  Surgeon: Jules Husbands, MD;  Location: [facility] ORS;  Service: General;  Laterality: Right;  ? LEFT HEART CATH AND CORONARY ANGIOGRAPHY N/A 04/10/2018  ? Procedure: LEFT HEART CATH AND CORONARY ANGIOGRAPHY poss PCI;  Surgeon: Minna Merritts, MD;  Location: [place] [facility];  Service: Cardiovascular;  Laterality: N/A;  ? PORTA CATH REMOVAL N/A 12/06/2020  ? Procedure: PORTA CATH REMOVAL;  Surgeon: Algernon Huxley, MD;  Location: [place] [facility];  Service: Cardiovascular;  Laterality: N/A;  ? PORTACATH PLACEMENT Left 10/24/2016  ? Procedure: INSERTION PORT-A-CATH;  Surgeon: [name], MD;  Location: [facility];  Service: General;  Laterality: Left;  ? RE-EXCISION OF BREAST LUMPECTOMY Right 05/21/2017  ? Procedure: RE-EXCISION OF BREAST LUMPECTOMY;  Surgeon: Clayburn Pert,  MD;  Location: [facility] ORS;  Service: General;  Laterality: Right;  ? SENTINEL NODE BIOPSY Right 03/21/2017  ? Procedure: SENTINEL NODE BIOPSY;  Surgeon: Clayburn Pert, MD;  Location: [facility] OR[facility];  Service: General;  Laterality: Right;  ? ?Current Outpatient Medications  ?Medication Instructions  ? acetaminophen (ACETAMINOPHEN 8 HOUR) 650 mg, Oral, Every 8 hours PRN  ? albuterol (PROVENTIL HFA;VENTOLIN HFA) 108 (90 Base) MCG/ACT inhaler 2 puffs, Inhalation, Every 6 hours PRN  ? baclofen (LIORESAL) 10 mg, Oral, Daily at bedtime  ? carvedilol (COREG) 6.25 mg, Oral, 2 times daily  ? cholecalciferol (VITAMIN D3) 1,000 Units, Oral, Daily  ? clopidogrel (PLAVIX) 75 mg, Oral, Daily with breakfast  ? Coenzyme Q10 300 mg, Oral, Daily  ? DULoxetine (CYMBALTA) 30 mg, Oral, Daily, (Take with '60mg'$  capsule to equal '90mg'$ )  ? DULoxetine (CYMBALTA) 60 mg, Oral, Daily, (Take with '30mg'$   capsule to equal '90mg'$ )  ? enalapril (VASOTEC) 5 mg, Oral, Daily  ? feeding supplement (ENSURE ENLIVE / ENSURE PLUS) LIQD 237 mLs, Oral, Every 24 hours  ? fexofenadine (ALLEGRA) 180 mg, Oral, Daily  ? fluticaso

## 2021-09-03 NOTE — ED Notes (Signed)
RN to bedside. Pt is trying to get out of bed. Pt had to be redirected.  ?

## 2021-09-03 NOTE — ED Notes (Signed)
Patient continues to pull off pulse ox.  Attempted multiple times to re-educate patient on importance of monitor.  Pulse ox removed from finger and placed on toe ?

## 2021-09-03 NOTE — Assessment & Plan Note (Addendum)
?   Most likely secondary to acute illness, some concern for progression of vascular dementia ?? Improved on HD2 and stable on HD3 ?? Cleared by neurology for outpatient workup  ?? MRI brain reassuring ? ?

## 2021-09-03 NOTE — ED Notes (Signed)
Pt is very confused. Pt cannot tell me her name and DOB. Unsure of baseline for her. No family.  ?

## 2021-09-03 NOTE — Evaluation (Signed)
Occupational Therapy Evaluation ?Patient Details ?Name: Krista Cisneros ?MRN: [id number] ?DOB: 03-13-1941 ?Today's Date: 09/03/2021 ? ? ?History of Present Illness presented to ER secondary to bilat LE edema; admitted for management of PNA (aspiration?).  Per chart review, family endorses fall approx 2 weeks prior to this admission with increase in confusion, speech since.  ? ?Clinical Impression ?  ?Krista Cisneros was seen for OT evaluation this date. Pt not oriented and unable to provide PLOF/home setup - per chart pt lives with daughter. Pt presents to acute OT demonstrating impaired ADL performance and functional cognition 2/2 decreased activity tolerance, decreased safety awareness, and impaired task initiation. Upon arrival pt stnading in room with PT.   ? ?Pt currently requires CGA + RW for ADL t/f - assist for lines/leads mgmt and to redirect. Assessment limited by poor command folowing and perseverations - requires step by step cues to attend to tasks. Demonstrates appropriate strength/ROM to complete dressing and toileting with no assist however ultimately anticipate requiring MIN A 2/2 cogntion deficits. When directed to word search task pt completes with MIN cues. Pt would benefit from skilled OT to address noted impairments and functional limitations (see below for any additional details). Upon hospital discharge, recommend [place] with 24/7 SUPERVISION to maximize pt safety and return to PLOF. ?   ? ?Recommendations for follow up therapy are one component of a multi-disciplinary discharge planning process, led by the attending physician.  Recommendations may be updated based on patient status, additional functional criteria and insurance authorization.  ? ?Follow Up Recommendations ? Home health OT  ?  ?Assistance Recommended at Discharge Frequent or constant Supervision/Assistance  ?Patient can return home with the following A lot of help with walking and/or transfers;A lot of help with  bathing/dressing/bathroom;Help with stairs or ramp for entrance ? ?  ?Functional Status Assessment ? Patient has had a recent decline in their functional status and demonstrates the ability to make significant improvements in function in a reasonable and predictable amount of time.  ?Equipment Recommendations ? Other (comment) (defer)  ?  ?Recommendations for Other Services   ? ? ?  ?Precautions / Restrictions Precautions ?Precautions: Fall ?Restrictions ?Weight Bearing Restrictions: No  ? ?  ? ?Mobility Bed Mobility ?Overal bed mobility: Needs Assistance ?Bed Mobility: Sit to Supine ?  ?  ?  ?Sit to supine: Min guard ?  ?  ?  ? ?Transfers ?Overall transfer level: Needs assistance ?Equipment used: Rolling walker (2 wheels) ?Transfers: Sit to/from Stand ?Sit to Stand: Min guard ?  ?  ?  ?  ?  ?  ?  ? ?  ?Balance Overall balance assessment: Needs assistance ?Sitting-balance support: No upper extremity supported, Feet supported ?Sitting balance-Leahy Scale: Good ?  ?  ?Standing balance support: Single extremity supported, During functional activity ?Standing balance-Leahy Scale: Fair ?  ?  ?  ?  ?  ?  ?  ?  ?  ?  ?  ?  ?   ? ?ADL either performed or assessed with clinical judgement  ? ?ADL Overall ADL's : Needs assistance/impaired ?  ?  ?  ?  ?  ?  ?  ?  ?  ?  ?  ?  ?  ?  ?  ?  ?  ?  ?  ?General ADL Comments: assessment limited by poor command folowing and perseverations - requires step by step cues to attend to tasks. Demonstrates appropriate strength/ROM to complete dressing and toileting with no assist however ultimately anticipate  requiring MIN A 2/2 cogntion deficits. When directed to word search task pt completes with MIN cues  ? ? ? ? ?Pertinent Vitals/Pain Pain Assessment ?Pain Assessment: No/denies pain  ? ? ? ?Hand Dominance   ?  ?Extremity/Trunk Assessment Upper Extremity Assessment ?Upper Extremity Assessment: Overall WFL for tasks assessed ?  ?Lower Extremity Assessment ?Lower Extremity Assessment: Overall  WFL for tasks assessed ?  ?  ?  ?Communication Communication ?Communication: Expressive difficulties (intermittent word salad) ?  ?Cognition Arousal/Alertness: Awake/alert ?Behavior During Therapy: Restless, Impulsive ?Overall Cognitive Status: No family/caregiver present to determine baseline cognitive functioning ?  ?  ?  ?  ?  ?  ?  ?  ?  ?  ?  ?  ?  ?  ?  ?  ?General Comments: does not state name, reads and writes staff names, perseverative on removing needle discard box from wall ?  ?  ?General Comments    ? ?  ?Exercises   ?  ?Shoulder Instructions    ? ? ?Home Living Family/patient expects to be discharged to:: Private residence ?Living Arrangements: Children ?Available Help at Discharge: Family;Available 24 hours/day;Available PRN/intermittently ?Type of Home: House ?Home Access: Stairs to enter ?Entrance Stairs-Number of Steps: 3 ?Entrance Stairs-Rails: Right ?Home Layout: Two level;Bed/bath upstairs ?Alternate Level Stairs-Number of Steps: 14 ?Alternate Level Stairs-Rails: Left ?Bathroom Shower/Tub: Tub/shower unit ?  ?Bathroom Toilet: Standard ?  ?  ?  ?  ?Additional Comments: Patient unable to provide information; family not present.  Information taken from previous documentation-will verify with family as available.  Patient lives with daughter and son in law. Her bed/bath are on 2nd floor. ?  ? ?  ?Prior Functioning/Environment Prior Level of Function : Patient poor historian/Family not available ?  ?  ?  ?  ?  ?  ?  ?  ?  ? ?  ?  ?OT Problem List: Decreased strength;Decreased activity tolerance;Impaired balance (sitting and/or standing);Decreased cognition;Decreased safety awareness ?  ?   ?OT Treatment/Interventions: Self-care/ADL training;Therapeutic exercise;Energy conservation;DME and/or AE instruction;Therapeutic activities;Patient/family education;Balance training  ?  ?OT Goals(Current goals can be found in the care plan section) Acute Rehab OT Goals ?Patient Stated Goal: to be left alone ?OT  Goal Formulation: With patient ?Time For Goal Achievement: 09/17/21 ?Potential to Achieve Goals: Fair ?ADL Goals ?Pt Will Perform Grooming: with set-up;with supervision;standing ?Pt Will Perform Lower Body Dressing: with modified independence;sit to/from stand ?Pt Will Transfer to Toilet: with supervision;ambulating;regular height toilet  ?OT Frequency: Min 2X/week ?  ? ?Co-evaluation   ?  ?  ?  ?  ? ?  ?AM-PAC OT "6 Clicks" Daily Activity     ?Outcome Measure Help from another person eating meals?: A Little ?Help from another person taking care of personal grooming?: A Little ?Help from another person toileting, which includes using toliet, bedpan, or urinal?: A Little ?Help from another person bathing (including washing, rinsing, drying)?: A Little ?Help from another person to put on and taking off regular upper body clothing?: A Little ?Help from another person to put on and taking off regular lower body clothing?: A Little ?6 Click Score: 18 ?  ?End of Session Equipment Utilized During Treatment: Rolling walker (2 wheels);Gait belt ?Nurse Communication: Mobility status ? ?Activity Tolerance: Patient tolerated treatment well ?Patient left: in bed;with call bell/phone within reach;with bed alarm set ? ?OT Visit Diagnosis: Unsteadiness on feet (R26.81);Repeated falls (R29.6)  ?              ?Time:  2947-6546 ?OT Time Calculation (min): 15 min ?Charges:  OT General Charges ?$OT Visit: 1 Visit ?OT Evaluation ?$OT Eval Moderate Complexity: 1 Mod ? ?Dessie Coma, M.S. OTR/L  ?09/03/21, 12:41 PM  ?ascom [phone] ? ?

## 2021-09-03 NOTE — ED Notes (Signed)
Family at bedside. Daughter advised pt lives with her. Daughter noticed pt had some memory issues about 3-4 weeks ago. The last few days it got worse. Otherwise, the pt is very active at home.  ?

## 2021-09-03 NOTE — ED Notes (Signed)
RN into room to check on patient.  Patient had removed all monitors, gown, and IV to L AC.  Patient able to tell RN she is currently in the hospital and the current month, but unable to indicate why she had removed everything.   ?

## 2021-09-03 NOTE — Assessment & Plan Note (Signed)
?   Discussed with daughter and son-in-law in the ED 09/03/21, showed images from CT chest, discussed comorbidities: given significant pulmonary disease, cardiac disease, concern for advancing dementia, I counseled that I do not believe this patient would survive CPR/intubation, or if she did survive such treatments for cardiac/respiratory arrest she would be unlikely to ever come off life support and regain any meaningful QOL.  Daughter states she will consider this, I advised strongly consider DNR, daughter will think about it, will revisit conversation at a later time ?

## 2021-09-03 NOTE — ED Notes (Signed)
RN to bedside to redirect pt back into bed.  ?

## 2021-09-03 NOTE — ED Notes (Signed)
Patient noted to be at end of bed.  Patient reports she "is in a bedroom" when asked where she currently is.  Re-oriented patient and patient repositioned in bed.  Bed alarm remains in place.   ?

## 2021-09-04 ENCOUNTER — Encounter: Payer: Self-pay | Admitting: Internal Medicine

## 2021-09-04 DIAGNOSIS — R410 Disorientation, unspecified: Secondary | ICD-10-CM

## 2021-09-04 LAB — BASIC METABOLIC PANEL
Anion gap: 14 (ref 5–15)
BUN: 45 mg/dL — ABNORMAL HIGH (ref 8–23)
CO2: 26 mmol/L (ref 22–32)
Calcium: 9.4 mg/dL (ref 8.9–10.3)
Chloride: 105 mmol/L (ref 98–111)
Creatinine, Ser: 1.5 mg/dL — ABNORMAL HIGH (ref 0.44–1.00)
GFR, Estimated: 35 mL/min — ABNORMAL LOW (ref >60)
Glucose, Bld: 133 mg/dL — ABNORMAL HIGH (ref 70–99)
Potassium: 4.8 mmol/L (ref 3.5–5.1)
Sodium: 145 mmol/L (ref 135–145)

## 2021-09-04 LAB — BLOOD GAS, VENOUS
Acid-Base Excess: 4.2 mmol/L — ABNORMAL HIGH (ref 0.0–2.0)
Bicarbonate: 31.1 mmol/L — ABNORMAL HIGH (ref 20.0–28.0)
Drawn by: 533
Patient temperature: 37
pCO2, Ven: 55 mmHg (ref 44–60)
pH, Ven: 7.36 (ref 7.25–7.43)

## 2021-09-04 LAB — RESPIRATORY PANEL BY PCR

## 2021-09-04 LAB — CBC
HCT: 38.8 % (ref 36.0–46.0)
Hemoglobin: 11.7 g/dL — ABNORMAL LOW (ref 12.0–15.0)
MCH: 24.6 pg — ABNORMAL LOW (ref 26.0–34.0)
MCHC: 30.2 g/dL (ref 30.0–36.0)
MCV: 81.5 fL (ref 80.0–100.0)
Platelets: 393 10*3/uL (ref 150–400)
RBC: 4.76 MIL/uL (ref 3.87–5.11)
RDW: 18.3 % — ABNORMAL HIGH (ref 11.5–15.5)
WBC: 13.4 10*3/uL — ABNORMAL HIGH (ref 4.0–10.5)
nRBC: 0 % (ref 0.0–0.2)

## 2021-09-04 LAB — HIV ANTIBODY (ROUTINE TESTING W REFLEX): HIV Screen 4th Generation wRfx: NONREACTIVE

## 2021-09-04 LAB — PROCALCITONIN: Procalcitonin: <0.1 ng/mL

## 2021-09-04 LAB — VITAMIN B12: Vitamin B-12: 208 pg/mL (ref 180–914)

## 2021-09-04 MED ORDER — ENALAPRIL MALEATE 5 MG PO TABS
5.0000 mg | ORAL_TABLET | Freq: Every day | ORAL | Status: DC
  Administered 2021-09-04 – 2021-09-05 (×2): 5 mg via ORAL
  Filled 2021-09-04 (×2): qty 1

## 2021-09-04 MED ORDER — CARVEDILOL 6.25 MG PO TABS
6.2500 mg | ORAL_TABLET | Freq: Two times a day (BID) | ORAL | Status: DC
Start: 2021-09-04 — End: 2021-09-05
  Administered 2021-09-04 – 2021-09-05 (×2): 6.25 mg via ORAL
  Filled 2021-09-04 (×2): qty 1

## 2021-09-04 MED ORDER — MELATONIN 5 MG PO TABS
2.5000 mg | ORAL_TABLET | Freq: Every evening | ORAL | Status: DC | PRN
  Administered 2021-09-04: 2.5 mg via ORAL
  Filled 2021-09-04: qty 1

## 2021-09-04 MED ORDER — IPRATROPIUM-ALBUTEROL 0.5-2.5 (3) MG/3ML IN SOLN
3.0000 mL | Freq: Three times a day (TID) | RESPIRATORY_TRACT | Status: DC
  Administered 2021-09-04 – 2021-09-05 (×4): 3 mL via RESPIRATORY_TRACT
  Filled 2021-09-04 (×5): qty 3

## 2021-09-04 MED ORDER — SPIRONOLACTONE 25 MG PO TABS
12.5000 mg | ORAL_TABLET | Freq: Every day | ORAL | Status: DC
  Administered 2021-09-04 – 2021-09-05 (×2): 12.5 mg via ORAL
  Filled 2021-09-04 (×2): qty 1
  Filled 2021-09-04 (×2): qty 0.5

## 2021-09-04 NOTE — Plan of Care (Signed)
?  Problem: Education: ?Goal: Knowledge of disease or condition will improve ?Outcome: Progressing ?Goal: Knowledge of the prescribed therapeutic regimen will improve ?Outcome: Progressing ?Goal: Individualized Educational Video(s) ?Outcome: Progressing ?  ?Problem: Respiratory: ?Goal: Ability to maintain a clear airway will improve ?Outcome: Progressing ?Goal: Levels of oxygenation will improve ?Outcome: Progressing ?Goal: Ability to maintain adequate ventilation will improve ?Outcome: Progressing ?  ?Problem: Clinical Measurements: ?Goal: Ability to maintain a body temperature in the normal range will improve ?Outcome: Progressing ?  ?Problem: Respiratory: ?Goal: Ability to maintain adequate ventilation will improve ?Outcome: Progressing ?Goal: Ability to maintain a clear airway will improve ?Outcome: Progressing ?  ?Problem: Education: ?Goal: Knowledge of General Education information will improve ?Description: Including pain rating scale, medication(s)/side effects and non-pharmacologic comfort measures ?Outcome: Progressing ?  ?Problem: Health Behavior/Discharge Planning: ?Goal: Ability to manage health-related needs will improve ?Outcome: Progressing ?  ?Problem: Clinical Measurements: ?Goal: Ability to maintain clinical measurements within normal limits will improve ?Outcome: Progressing ?Goal: Will remain free from infection ?Outcome: Progressing ?Goal: Diagnostic test results will improve ?Outcome: Progressing ?Goal: Respiratory complications will improve ?Outcome: Progressing ?Goal: Cardiovascular complication will be avoided ?Outcome: Progressing ?  ?Problem: Activity: ?Goal: Risk for activity intolerance will decrease ?Outcome: Progressing ?  ?Problem: Nutrition: ?Goal: Adequate nutrition will be maintained ?Outcome: Progressing ?  ?Problem: Coping: ?Goal: Level of anxiety will decrease ?Outcome: Progressing ?  ?Problem: Elimination: ?Goal: Will not experience complications related to bowel  motility ?Outcome: Progressing ?Goal: Will not experience complications related to urinary retention ?Outcome: Progressing ?  ?Problem: Pain Managment: ?Goal: General experience of comfort will improve ?Outcome: Progressing ?  ?Problem: Safety: ?Goal: Ability to remain free from injury will improve ?Outcome: Progressing ?  ?Problem: Skin Integrity: ?Goal: Risk for impaired skin integrity will decrease ?Outcome: Progressing ?  ?

## 2021-09-04 NOTE — Progress Notes (Signed)
Subjective/Interval History:  ?-On ceftriaxone and metronidazole for her pneumonia, as well as prednisone (125 mg received on 5/12, 40 mg on 5/13 and 5/14 a.m.) ?-Remained agitated and paranoid overnight ?-Refused EEG  ? ?Objective: ?Current vital signs: ?BP (!) 154/77 (BP Location: Left Arm)   Pulse 85   Temp 97.9 ?F (36.6 ?C)   Resp 16   Ht '5\' 2"'$  (1.575 m)   Wt 64 kg   SpO2 97%   BMI 25.81 kg/m?  ?Vital signs in last 24 hours: ?Temp:  [97.6 ?F (36.4 ?C)-98.9 ?F (37.2 ?C)] 97.9 ?F (36.6 ?C) (05/14 3500) ?Pulse Rate:  [82-94] 85 (05/14 0814) ?Resp:  [16-20] 16 (05/14 0814) ?BP: (118-154)/(59-97) 154/77 (05/14 9381) ?SpO2:  [92 %-100 %] 97 % (05/14 0814) ? ?Exam: ?Mental status: Alert, awake, oriented to family members in the room and the fact that she is here with breathing trouble ?Cranial nerves: EOMI, face symmetric, tongue midline ?Motor: Moving all fours equally antigravity spontaneously ? ? ?Lab Results: ? ?Basic Metabolic Panel: ?Recent Labs  ?Lab 09/02/21 ?1508 09/03/21 ?0014 09/03/21 ?8299 09/03/21 ?1918 09/04/21 ?0515  ?NA 143  --  142  --  145  ?K 4.0  --  4.4  --  4.8  ?CL 106  --  106  --  105  ?CO2 28  --  26  --  26  ?GLUCOSE 120* 147* 127* 165* 133*  ?BUN 47*  --  41*  --  45*  ?CREATININE 2.04*  --  1.69*  --  1.50*  ?CALCIUM 9.5  --  9.4  --  9.4  ?MG  --   --  2.3  --   --   ?Estimated Creatinine Clearance: 26.3 mL/min (A) (by C-G formula based on SCr of 1.5 mg/dL (H)).  ? ?CBC: ?Recent Labs  ?Lab 09/02/21 ?1508 09/03/21 ?3716 09/04/21 ?0515  ?WBC 9.8 7.5 13.4*  ?NEUTROABS 5.7  --   --   ?HGB 12.8 12.4 11.7*  ?HCT 43.9 41.1 38.8  ?MCV 84.1 81.2 81.5  ?PLT 416* 363 393  ? ? ? ?Studies/Results: ?DG Chest 2 View ? ?Result Date: 09/02/2021 ?CLINICAL DATA:  Shortness of breath EXAM: CHEST - 2 VIEW COMPARISON:  08/26/2021 FINDINGS: Numerous leads and wires project over the chest. Midline trachea. Mild cardiomegaly. Right axillary node dissection. Atherosclerosis in the transverse aorta. Moderate  hiatal hernia. No pleural effusion or pneumothorax. Slight improvement in right middle lobe airspace disease. New or increased right lower lobe airspace disease, with difference most apparent on the lateral view. Persistent left base airspace disease. Underlying emphysema in the upper lobes. IMPRESSION: Improved right middle and worsened right lower lobe airspace disease/pneumonia. Similar left base airspace disease. Cardiomegaly without congestive failure. Emphysema (ICD10-J43.9). Hiatal hernia. Electronically Signed   By: Abigail Miyamoto M.D.   On: 09/02/2021 15:39  ? ?CT Chest Wo Contrast ? ?Result Date: 09/02/2021 ?CLINICAL DATA:  Pneumonia. EXAM: CT CHEST WITHOUT CONTRAST TECHNIQUE: Multidetector CT imaging of the chest was performed following the standard protocol without IV contrast. RADIATION DOSE REDUCTION: This exam was performed according to the departmental dose-optimization program which includes automated exposure control, adjustment of the mA and/or kV according to patient size and/or use of iterative reconstruction technique. COMPARISON:  06/17/2020 FINDINGS: Cardiovascular: The heart is within normal limits in size for age. No pericardial effusion. Stable tortuosity, ectasia calcification of the thoracic aorta. No focal aneurysm. Stable three-vessel coronary artery calcifications. Mediastinum/Nodes: Stable borderline mediastinal hilar lymph nodes likely inflammatory/reactive. No mass or overt  adenopathy. Stable large hiatal hernia. 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 Dense right middle lobe airspace consolidation consistent with pneumonia. No left-sided pulmonary infiltrates. No pleural effusions. Upper Abdomen: No significant upper abdominal findings. Stable aortic and branch vessel calcifications. Musculoskeletal: No supraclavicular or axillary adenopathy. No obvious breast masses. Suspect prior surgical changes involving right breast. The bony thorax is intact. No  worrisome bone lesions. IMPRESSION: 1. Dense right middle lobe airspace consolidation consistent with pneumonia. 2. 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 3. Stable borderline mediastinal and hilar lymph nodes likely inflammatory/reactive and due to the underlying lung disease. 4. Stable large hiatal hernia. 5. Stable atherosclerotic calcifications involving the aorta and branch vessels including the coronary arteries. Aortic Atherosclerosis (ICD10-I70.0) and Emphysema (ICD10-J43.9). Electronically Signed   By: Marijo Sanes M.D.   On: 09/02/2021 18:26  ? ?MR BRAIN WO CONTRAST ? ?Result Date: 09/03/2021 ?CLINICAL DATA:  Initial evaluation for acute TIA. EXAM: MRI HEAD WITHOUT CONTRAST TECHNIQUE: Multiplanar, multiecho pulse sequences of the brain and surrounding structures were obtained without intravenous contrast. COMPARISON:  Prior study from 08/26/2021.  Filled FINDINGS: Brain: Examination technically limited as the patient was unable to tolerate the full length of the exam. Diffusion-weighted sequences, axial T2 weighted sequence, and sagittal T1 weighted sequence only were performed. Additionally, provided images are degraded by motion. Age-related cerebral atrophy with chronic small vessel ischemic disease. Scattered remote lacunar infarcts noted about the left thalamus and cerebellum. No abnormal foci of restricted diffusion to suggest acute or subacute ischemia. Gray-white matter differentiation grossly maintained. No areas of chronic cortical infarction. No mass lesion or mass effect. No hydrocephalus or extra-axial fluid collection. Pituitary gland suprasellar region normal. Midline structures intact and normal informed. Vascular: Major intracranial vascular flow voids are grossly maintained. Skull and upper cervical spine: Craniocervical junction within normal limits. Bone marrow signal intensity normal. No scalp soft tissue abnormality. Sinuses/Orbits: Globes and orbital soft tissues  grossly within normal limits. Scattered mucosal thickening noted about the ethmoidal air cells and maxillary sinuses. No significant mastoid effusion. Other: None. IMPRESSION: 1. Technically limited exam due to patient's inability to tolerate the full length of the exam. 2. No acute intracranial infarct or other abnormality. 3. Age-related cerebral atrophy with chronic small vessel ischemic disease, with scattered remote lacunar infarcts about the left thalamus and cerebellum. Electronically Signed   By: Jeannine Boga M.D.   On: 09/03/2021 01:40  ? ?US Carotid Bilateral ? ?Result Date: 09/02/2021 ?CLINICAL DATA:  Stroke-like symptoms EXAM: BILATERAL CAROTID DUPLEX ULTRASOUND TECHNIQUE: Pearline Cables scale imaging, color Doppler and duplex ultrasound were performed of bilateral carotid and vertebral arteries in the neck. COMPARISON:  None Available. FINDINGS: Criteria: Quantification of carotid stenosis is based on velocity parameters that correlate the residual internal carotid diameter with NASCET-based stenosis levels, using the diameter of the distal internal carotid lumen as the denominator for stenosis measurement. The following velocity measurements were obtained: RIGHT ICA: 75/24 cm/sec CCA: 81/01 cm/sec SYSTOLIC ICA/CCA RATIO:  0.8 ECA: 121 cm/sec LEFT ICA: 102/21 cm/sec CCA: 75/1 cm/sec SYSTOLIC ICA/CCA RATIO:  1.4 ECA: 101 cm/sec RIGHT CAROTID ARTERY: Preliminary grayscale images demonstrate mild intimal thickening within the distal common carotid artery and extending into the proximal internal carotid artery. The waveforms, velocities and flow velocity ratios however demonstrate no evidence of focal hemodynamically significant stenosis. RIGHT VERTEBRAL ARTERY:  Antegrade in nature. LEFT CAROTID ARTERY: Preliminary grayscale images demonstrate no significant atherosclerotic plaque. The waveforms, velocities and flow velocity ratios show no evidence of focal  hemodynamically significant stenosis. LEFT VERTEBRAL  ARTERY:  Antegrade in nature. IMPRESSION: No evidence of hemodynamically significant carotid stenosis. Electronically Signed   By: Inez Catalina M.D.   On: 09/02/2021 21:58  ? ?US Venous Img Lower Bilateral ? ?Resul

## 2021-09-04 NOTE — Progress Notes (Signed)
?PROGRESS NOTE ? ? ? ?Krista Cisneros  GQB:[id number] DOB: 1940-12-23  ?DOA: 09/02/2021 ?Date of Service: 09/04/21 ?PCP: Sharyne Peach, MD ? ? ? ? ?Brief Narrative / Hospital Course:  ?Brought to ED 09/02/2021 with concern for lower extremity edema, increasing confusion.  Noted to have AKI, likely due to increased dose of Lasix at home.  Concern for increasing confusion, in ED patient received IV fluids, started on antibiotics for aspiration pneumonia given CT results showing right middle lobe pneumonia with significant bullous emphysema.  Po home meds held pending speech evaluation, which was later passed.  MRI brain showed no acute process but evidence of old infarcts, chronic microvascular change.  09/03/2021, echocardiogram pending, patient saturating well on room air.  Pending PT/OT evaluation, suspect will need home health and possibly home O2 on ambulation.  Continuing IV antibiotics for aspiration pneumonia. Respiratory panel PCR negative. [place] discussion with family, see A/P.  09/04/2021: Mental status is significantly improved, patient is alert, oriented to person, place, time (month, year), situation.  Some improvement in creatinine, 1.69-1.5.  Some elevation in WBC, as expected with steroids.  Echocardiogram EF 60 to 82% grade 1 diastolic dysfunction.  No stenosis on carotid ultrasound. SpO2 good on RA.  Neurology has again evaluated the patient, likely multifactorial delirium, low concern for seizures and patient unable to complete EEG.  Taper baclofen, recommend outpatient neurocognitive testing ? ? ? ?Consultants:  ?None ? ?Procedures: ?none ? ? ? ?Subjective: ?Patient seen and examined resting comfortably in bed this morning, reexamined again in the afternoon with family present.  No complaints/concerns.  Mentation and mood are much better than yesterday. ? ? ? ?ASSESSMENT & PLAN: ?  ?Active Problems: ?  Essential hypertension ?  COPD (chronic obstructive pulmonary disease) ([place]) ?  GERD  (gastroesophageal reflux disease) ?  Goals of care, counseling/discussion ?  Acute kidney injury superimposed on chronic kidney disease (Krista Cisneros) ?  Coronary artery disease involving native coronary artery of native heart with angina pectoris (Krista Cisneros) ?  Chronic diastolic CHF (congestive heart failure) ([place]) ?  Aspiration pneumonia (Krista Cisneros) ?  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  Multiple falls ?  Altered mental state ? ? ? ?Aspiration pneumonia (Krista Cisneros) ?CT Chest 05/12: Dense right middle lobe airspace consolidation consistent with pneumonia. 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 ?Continue ceftriaxone, stopped metronidazole, received ceftriaxone and azithromycin in ED ?O2 prn been doing well on room air ?Aspiration precautions ?ST has seen patient, cleared for po intake   ? ?COPD (chronic obstructive pulmonary disease) (Krista Cisneros) ?CT Chest 05/12: Dense right middle lobe airspace consolidation consistent with pneumonia. 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 taper ? ?Coronary artery disease involving native coronary artery of native heart with angina pectoris [facility]), Essential hypertension, Chronic diastolic CHF (congestive heart failure) (Krista Cisneros) ?Continued low-dose isosorbide ?Initially held home beta-blocker, ARB/ACE, spironolactone for now given soft blood pressure ?Cleared for p.o. intake, somewhat hypertensive, restarted home medication ? ?GERD (gastroesophageal reflux disease) ?Continue PPI. ? ?Multiple falls ?PT/OT to assess likely needs at least home health. ? ?Acute kidney injury superimposed on chronic kidney disease (Krista Cisneros) ?Hold nephrotoxic  medications ?Monitor a.m. BMP -creatinine improved ?Received IV  fluids in ED, currently tolerating p.o. ? ?Altered mental state ?Most likely secondary to acute illness, some concern for progression of vascular dementia ?Aspiration precautions, fall precautions ?Neurology consult appreciated. Unable to complete EEG here, consider outpatient.  Have tapered down on baclofen, plan to stop this altogether versus continue at no more than 20 mg nightly.  Pending HIV, MMA, RPR, B1 ? 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 ?Goals of care, counseling/discussion ?Discussed with daughter and son-in-law in the ED 09/03/21, showed images from CT chest, discussed comorbidities: given significant pulmonary disease, cardiac disease, concern for advancing dementia, I counseled that I do not believe this patient would survive CPR/intubation, or if she did survive such treatments for cardiac/respiratory arrest she would be unlikely to ever come off life support and regain any meaningful QOL.  Daughter states she will consider this, I advised strongly consider DNR, daughter will think about it, will revisit conversation at a later time /follow-up outpatient ? ?  ? ? ?DVT prophylaxis: heparin  ?Code Status: FULL -see assessment/plan regarding goals of care discussion ?Family Communication: Daughter and son-in-law at bedside this afternoon ?Disposition Plan / TOC needs: PT/OT to evaluate, patient lives at home and I suspect she will at least need home health services, possible oxygen ?Barriers to  discharge / significant pending items: Medical improvement, continued IV antibiotics for aspiration pneumonia, treating for COPD exacerbation, altered mental status likely secondary to above versus progressing likely vascular dementia, pending PT/OT, mental status has improved today, if continues stable overnight may be able to be discharged tomorrow 09/05/2021 ? ? ? ? ? ? ? ? ? ? ? ? ?Objective: ?Vitals:  ? 09/03/21 2328 09/04/21 0300 09/04/21 0814 09/04/21 1250  ?BP: 139/66 140/70 (!) 154/77 (!) 142/70  ?Pulse: 88 82 85 83  ?Resp: '20 18 16 16  '$ ?Temp: 98.9 ?F (37.2 ?C) 98.5 ?F (36.9 ?C) 97.9 ?F (36.6 ?C) 98.1 ?F (36.7 ?C)  ?TempSrc: Oral Oral    ?SpO2: 100% 98% 97% 95%  ?Weight:      ?Height:      ? ? ?Intake/Output Summary (Last 24 hours) at 09/04/2021 1532 ?Last data filed at 09/04/2021 1045 ?Gross per 24 hour  ?Intake 650.33 ml  ?Output --  ?Net 650.33 ml  ? ? ?Filed Weights  ? 09/02/21 1459  ?Weight: 64 kg  ? ? ?Examination:  ?Constitutional:  ?VS as above ?General Appearance: alert, well-nourished, NAD ?Eyes: ?Normal lids and conjunctive, non-icteric sclera ?Ears, Nose, Mouth, Throat: ?Normal appearance ?Neck: ?No masses, trachea midline ?Respiratory: ?Normal respiratory effort ?Breath sounds present but diminshed ?Cardiovascular: ?S1/S2 normal, no murmur/rub/gallop auscultated ?Trace lower extremity edema ?Gastrointestinal: ?Nontender, no masses ?No hernia appreciated ?Musculoskeletal:  ?No clubbing/cyanosis of digits ?Neurological: ?No cranial nerve deficit on limited exam ?Patient is alert, speech is clear, no focal motor deficits ?Patient is not oriented to person, place, time, situation ?Psychiatric: ?Lacking judgment/insight ?Calm mood and affect ? ? ? ? ? ? ?Scheduled Medications:  ? baclofen  5 mg Oral QHS  ? clopidogrel  75 mg Oral Daily  ? DULoxetine  90 mg Oral Daily  ?  enalapril  5 mg Oral Daily  ? fluticasone furoate-vilanterol  1 puff Inhalation Daily  ? heparin injection (subcutaneous)  5,000 Units  Subcutaneous Q12H  ? hydrALAZINE  10 mg Intravenous Q6H  ? ipratropium-albuterol  3 mL Nebulization TID  ? isosorbide mononitrate  15 mg Oral Daily  ? methylPREDNISolone (SOLU-MEDROL) injection  40 mg Intravenous

## 2021-09-05 LAB — CBC
HCT: 37.9 % (ref 36.0–46.0)
Hemoglobin: 11.3 g/dL — ABNORMAL LOW (ref 12.0–15.0)
MCH: 24.1 pg — ABNORMAL LOW (ref 26.0–34.0)
MCHC: 29.8 g/dL — ABNORMAL LOW (ref 30.0–36.0)
MCV: 80.8 fL (ref 80.0–100.0)
Platelets: 347 10*3/uL (ref 150–400)
RBC: 4.69 MIL/uL (ref 3.87–5.11)
RDW: 18.6 % — ABNORMAL HIGH (ref 11.5–15.5)
WBC: 13.3 10*3/uL — ABNORMAL HIGH (ref 4.0–10.5)
nRBC: 0 % (ref 0.0–0.2)

## 2021-09-05 LAB — BASIC METABOLIC PANEL
Anion gap: 8 (ref 5–15)
BUN: 46 mg/dL — ABNORMAL HIGH (ref 8–23)
CO2: 25 mmol/L (ref 22–32)
Calcium: 9.2 mg/dL (ref 8.9–10.3)
Chloride: 109 mmol/L (ref 98–111)
Creatinine, Ser: 1.52 mg/dL — ABNORMAL HIGH (ref 0.44–1.00)
GFR, Estimated: 34 mL/min — ABNORMAL LOW (ref >60)
Glucose, Bld: 104 mg/dL — ABNORMAL HIGH (ref 70–99)
Potassium: 4.6 mmol/L (ref 3.5–5.1)
Sodium: 142 mmol/L (ref 135–145)

## 2021-09-05 LAB — RPR: RPR Ser Ql: NONREACTIVE

## 2021-09-05 MED ORDER — CLINDAMYCIN HCL 150 MG PO CAPS: 450.0000 mg | ORAL_CAPSULE | Freq: Three times a day (TID) | ORAL | 0 refills | Status: AC

## 2021-09-05 MED ORDER — MELATONIN 5 MG PO TABS: 2.5000 mg | ORAL_TABLET | Freq: Every evening | ORAL | 0 refills | Status: AC | PRN

## 2021-09-05 MED ORDER — PREDNISONE 10 MG PO TABS: ORAL_TABLET | ORAL | 0 refills | Status: AC

## 2021-09-05 MED ORDER — BACLOFEN 5 MG PO TABS: ORAL_TABLET | ORAL | 0 refills | Status: AC

## 2021-09-05 NOTE — Progress Notes (Signed)
Physical Therapy Treatment ?Patient Details ?Name: Krista Cisneros ?MRN: [id number] ?DOB: 1940-12-14 ?Today's Date: 09/05/2021 ? ? ?History of Present Illness presented to ER secondary to bilat LE edema; admitted for management of PNA (aspiration?).  Per chart review, family endorses fall approx 2 weeks prior to this admission with increase in confusion, speech since. ? ?  ?PT Comments  ? ? Patient alert, agreeable to mobility, per RN in room pt did just finish ambulating with nursing staff. Denied pain throughout session. Pt oriented x3, still slightly impulsive with mobility. She was able to perform supine to sit with handheld assist from RN though not necessarily required, did return to bed with supervision.sit <> stand with and without RW, supervision. Pt reaches for external support (mostly with BUE) if not using RW. Pt stated at home she'll use her "go go machine" AKA the walker when she feels like her legs are weak. Able to stand at the sink supervision for mouth rinsing, and ambulated ~55f with RW and supervision. No LOB noted. The patient would benefit from further skilled PT intervention to continue to progress towards goals. Recommendation remains appropriate.  ?  ?Recommendations for follow up therapy are one component of a multi-disciplinary discharge planning process, led by the attending physician.  Recommendations may be updated based on patient status, additional functional criteria and insurance authorization. ? ?Follow Up Recommendations ? Home health PT ?  ?  ?Assistance Recommended at Discharge Frequent or constant Supervision/Assistance  ?Patient can return home with the following A little help with walking and/or transfers;A little help with bathing/dressing/bathroom;Assistance with cooking/housework;Direct supervision/assist for medications management;Assist for transportation;Direct supervision/assist for financial management;Help with stairs or ramp for entrance ?  ?Equipment  Recommendations ? None recommended by PT  ?  ?Recommendations for Other Services   ? ? ?  ?Precautions / Restrictions Precautions ?Precautions: Fall ?Restrictions ?Weight Bearing Restrictions: No  ?  ? ?Mobility ? Bed Mobility ?Overal bed mobility: Needs Assistance ?Bed Mobility: Sit to Supine ?  ?  ?Supine to sit: Min guard ?Sit to supine: Supervision ?  ?General bed mobility comments: RN in room provided handheld assist to pt, though not necessarily required ?  ? ?Transfers ?Overall transfer level: Needs assistance ?Equipment used: Rolling walker (2 wheels), None ?Transfers: Sit to/from Stand ?Sit to Stand: Supervision ?  ?  ?  ?  ?  ?General transfer comment: pt somewhat impulsive, able to stand with external support prior to use of RW to get to the sink ?  ? ?Ambulation/Gait ?Ambulation/Gait assistance: Supervision ?Gait Distance (Feet): 80 Feet ?Assistive device: Rolling walker (2 wheels) ?  ?  ?  ?  ?General Gait Details: pt does tend to keep RW outside BOS, but no LOB noted, pt reported that she will use RW and cane at home as needed ? ? ?Stairs ?  ?  ?  ?  ?  ? ? ?Wheelchair Mobility ?  ? ?Modified Rankin (Stroke Patients Only) ?  ? ? ?  ?Balance Overall balance assessment: Needs assistance ?Sitting-balance support: Feet supported ?Sitting balance-Leahy Scale: Good ?  ?  ?Standing balance support: Single extremity supported, During functional activity ?Standing balance-Leahy Scale: Fair ?Standing balance comment: reliant on some kind of external UE support, whether its bed rails or RW, counter, etc ?  ?  ?  ?  ?  ?  ?  ?  ?  ?  ?  ?  ? ?  ?Cognition Arousal/Alertness: Awake/alert ?Behavior During Therapy: Restless ?Overall Cognitive Status: No  family/caregiver present to determine baseline cognitive functioning ?  ?  ?  ?  ?  ?  ?  ?  ?  ?  ?  ?  ?  ?  ?  ?  ?  ?  ?  ? ?  ?Exercises   ? ?  ?General Comments   ?  ?  ? ?Pertinent Vitals/Pain Pain Assessment ?Pain Assessment: No/denies pain  ? ? ?Home Living    ?  ?  ?  ?  ?  ?  ?  ?  ?  ?   ?  ?Prior Function    ?  ?  ?   ? ?PT Goals (current goals can now be found in the care plan section) Progress towards PT goals: Progressing toward goals ? ?  ?Frequency ? ? ? Min 2X/week ? ? ? ?  ?PT Plan Current plan remains appropriate  ? ? ?Co-evaluation   ?  ?  ?  ?  ? ?  ?AM-PAC PT "6 Clicks" Mobility   ?Outcome Measure ? Help needed turning from your back to your side while in a flat bed without using bedrails?: None ?Help needed moving from lying on your back to sitting on the side of a flat bed without using bedrails?: None ?Help needed moving to and from a bed to a chair (including a wheelchair)?: None ?Help needed standing up from a chair using your arms (e.g., wheelchair or bedside chair)?: None ?Help needed to walk in hospital room?: None ?Help needed climbing 3-5 steps with a railing? : A Little ?6 Click Score: 23 ? ?  ?End of Session   ?Activity Tolerance: Patient tolerated treatment well ?Patient left: in bed;with call bell/phone within reach;with bed alarm set;with nursing/sitter in room ?Nurse Communication: Mobility status ?PT Visit Diagnosis: Muscle weakness (generalized) (M62.81);Difficulty in walking, not elsewhere classified (R26.2) ?  ? ? ?Time: 3382-5053 ?PT Time Calculation (min) (ACUTE ONLY): 12 min ? ?Charges:  $Therapeutic Activity: 8-22 mins          ?          ?Lieutenant Diego PT, DPT ?10:41 AM,09/05/21 ? ? ?

## 2021-09-05 NOTE — TOC Initial Note (Addendum)
Transition of Care (TOC) - Initial/Assessment Note  ? ? ?Patient Details  ?Name: Krista Cisneros ?MRN: [id number] ?Date of Birth: Jan 16, 1941 ? ?Transition of Care (TOC) CM/SW Contact:    ?Candie Chroman, LCSW ?Phone Number: ?09/05/2021, 11:45 AM ? ?Clinical Narrative:  Readmission prevention screen complete. CSW met with patient. No supports at bedside. CSW introduced role and explained that discharge planning would be discussed. PCP is Salome Holmes, MD. Family takes her to appointments. [facility] [place]. No issues obtaining medications. No home health prior to admission. She said she last worked with an agency about 6 months ago. According to chart review this was [facility]. She is agreeable to this agency again. Faxed referral for review. If they are unable to accept patient does not have another preference. Patient has a 4-prong cane, RW, and wheelchair at home. She also has a shower chair but does not use it. No further concerns. CSW encouraged patient to contact CSW as needed. CSW will continue to follow patient for support and facilitate return home when stable.  ? ?1:09 pm: Spoke with Angie at [facility]. She is waiting to hear from their intake dept whether they can accept or not and said to call back if CSW has not received call within 30 minutes. ? ?Expected Discharge Plan: [place] ?Barriers to Discharge: Continued Medical Work up ? ? ?Patient Goals and CMS Choice ?  ?CMS Medicare.gov Compare Post Acute Care list provided to:: Patient ?  ? ?Expected Discharge Plan and Services ?Expected Discharge Plan: [place] ?  ?  ?Post Acute Care Choice: Home Health ?Living arrangements for the past 2 months: [place] ?                ?  ?  ?  ?  ?  ?  ?  ?  ?  ?  ? ?Prior Living Arrangements/Services ?Living arrangements for the past 2 months: [place] ?Lives with:: Adult Children ?Patient language and need for interpreter reviewed::  Yes ?Do you feel safe going back to the place where you live?: Yes      ?Need for Family Participation in Patient Care: Yes (Comment) ?Care giver support system in place?: Yes (comment) ?Current home services: DME ?Criminal Activity/Legal Involvement Pertinent to Current Situation/Hospitalization: No - Comment as needed ? ?Activities of Daily Living ?Home Assistive Devices/Equipment: None ?ADL Screening (condition at time of admission) ?Patient's cognitive ability adequate to safely complete daily activities?: Yes ?Is the patient deaf or have difficulty hearing?: No ?Does the patient have difficulty seeing, even when wearing glasses/contacts?: No ?Does the patient have difficulty concentrating, remembering, or making decisions?: Yes ?Patient able to express need for assistance with ADLs?: Yes ?Does the patient have difficulty dressing or bathing?: No ?Independently performs ADLs?: No ?Communication: Independent ?Toileting: Needs assistance ?Is this a change from baseline?: Change from baseline, expected to last <3 days ?In/Out Bed: Needs assistance ?Is this a change from baseline?: Change from baseline, expected to last <3 days ?Does the patient have difficulty walking or climbing stairs?: Yes ?Weakness of Legs: Both ?Weakness of Arms/Hands: None ? ?Permission Sought/Granted ?Permission sought to share information with : Customer service manager ?Permission granted to share information with : Yes, Verbal Permission Granted ?   ? Permission granted to share info w AGENCY: [place] ?   ?   ? ?Emotional Assessment ?Appearance:: Appears stated age ?Attitude/Demeanor/Rapport: Engaged, Gracious ?Affect (typically observed): Accepting,  Appropriate, Calm, Pleasant ?Orientation: : Oriented to Self, Oriented to Place, Oriented to  Time, Oriented to Situation ?Alcohol / Substance Use: Not Applicable ?Psych Involvement: No (comment) ? ?Admission diagnosis:  Peripheral edema [R60.9] ?SOB (shortness of breath)  [R06.02] ?AKI (acute kidney injury) ([place]) [N17.9] ?Community acquired pneumonia, unspecified laterality [J18.9] ?Patient Active Problem List  ? Diagnosis Date Noted  ? Altered mental state 09/03/2021  ? SOB (shortness of breath) 09/03/2021  ? Aspiration pneumonia ([place]) 09/02/2021  ? 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 Multiple falls 09/02/2021  ? Acute respiratory failure with hypoxia ([place]) 11/14/2020  ? COPD with acute exacerbation ([place]) 11/13/2020  ? Community acquired pneumonia 06/17/2020  ? Generalized weakness 06/17/2020  ? History of COVID-19, December 2021 06/17/2020  ? History of breast cancer 06/17/2020  ? Chronic diastolic CHF (congestive heart failure) ([place]) 06/17/2020  ? Stage 3a chronic kidney disease ([place]) 06/17/2020  ? Sepsis due to Escherichia coli (E. coli) ([place]) 10/10/2019  ? Acute lower UTI 10/10/2019  ? SIRS (systemic inflammatory response syndrome) ([place]) 10/07/2019  ? Constipation 10/07/2019  ? Urinary retention 10/07/2019  ? Iron deficiency anemia 05/30/2019  ? Ischemic cardiomyopathy 06/03/2018  ? Coronary artery disease involving native coronary artery of native heart with angina pectoris ([place]) 05/14/2018  ? Chronic systolic heart failure ([place])   ? Coronary artery disease involving native coronary artery of native heart without angina pectoris 04/22/2018  ? Non-STEMI (non-ST elevated myocardial infarction) ([place]) 04/22/2018  ? NSTEMI (non-ST elevated myocardial infarction) ([place]) 04/09/2018  ? Rectal bleeding 03/22/2018  ? Malignant neoplasm of upper-outer quadrant of right breast in female, estrogen receptor negative ([place]) 06/19/2017  ? Incarcerated hernia 03/26/2017  ? Incarcerated inguinal hernia   ? Acute kidney injury superimposed on chronic kidney disease ([place]) 02/07/2017  ? Hypomagnesemia 01/30/2017  ? Goals of care, counseling/discussion 10/20/2016  ? Malignant neoplasm of right female breast ([place]) 10/16/2016  ? Hyperlipidemia, mixed 10/08/2016  ?  Chronic venous insufficiency 09/14/2016  ? GERD (gastroesophageal reflux disease) 09/14/2016  ? Pain in limb 05/29/2016  ? Essential hypertension 05/29/2016  ? COPD (chronic obstructive pulmonary disease) ([place]) 05/29/2016  ? Hardening of the aorta (main artery of the heart) ([place]) 01/07/2016  ? Hyperglycemia, unspecified 01/07/2016  ? Neuropathy 11/15/2015  ? Severe recurrent major depression without psychotic features ([place]) 09/10/2015  ? Allergic rhinitis 04/10/2014  ? Depression 04/10/2014  ? Obesity, unspecified 04/10/2014  ? ?PCP:  Sharyne Peach, MD ?Pharmacy:   ?[id number] [place] [facility] [address] ?[address] ?[id number]?Phone: [phone]: [phone]? ? ? ? ?Social Determinants of Health (SDOH) Interventions ?  ? ?Readmission Risk Interventions ? ?  09/05/2021  ? 11:43 AM 11/17/2020  ?  2:04 PM 11/15/2020  ?  4:03 PM  ?Readmission Risk Prevention Plan  ?Transportation Screening Complete Complete Complete  ?Medication Review (Press photographer Complete Complete Complete  ?PCP or Specialist appointment within 3-5 days of discharge Complete    ?[place] Consult   Complete  ?SW Recovery Care/Counseling Consult Complete Complete Complete  ?Palliative Care Screening Not Applicable Complete Complete  ?SBonoNot Applicable Complete Patient Refused  ? ? ? ?

## 2021-09-05 NOTE — [place] Notes (Signed)
PT Cancellation Note ? ?Patient Details ?Name: Krista Cisneros ?MRN: [id number] ?DOB: 1940-09-02 ? ? ?Cancelled Treatment:    Reason Eval/Treat Not Completed: Other (comment). Pt eating breakfast at this time, PT to re-attempt as able. ? ? ?Lieutenant Diego PT, DPT ?9:19 AM,09/05/21 ? ?

## 2021-09-05 NOTE — Discharge Summary (Signed)
Physician Discharge Summary  ?Krista Cisneros EXB:[id number] DOB: [birth year] DOA: 09/02/2021 ? ?PCP: Sharyne Peach, MD ? ?Admit date: 09/02/2021 ?Discharge date: 09/05/2021 ? ?Admitted From: home ?Disposition:  home w/ HH ? ? ? ?Recommendations for Outpatient Follow-up:  ?Follow up with PCP in 1-2 weeks ?Please obtain labs/tests: BMP and CBC in 1-2 weeks ?Please follow up with neurology for cognitive assessment  ?Tapering off Baclofen ?Continue steroids and antibiotics for COPD and aspiration pneumonia ?Please follow up on the following pending results: neurology to follow up on delirium labs as below see A/P ? ?Home Health: yes  ?Equipment/Devices: n/a ? ?Discharge Condition: good  ?CODE STATUS: FULL  ?Diet recommendation:  ?Diet Orders (From admission, onward)  ? ?  Start     Ordered  ? 09/05/21 0000  Diet - low sodium heart healthy       ? 09/05/21 1349  ? 09/05/21 0000  Diet - low sodium heart healthy       ? 09/05/21 1349  ? 09/03/21 0941  Diet regular Room service appropriate? Yes; Fluid consistency: Thin  Diet effective now       ?Question Answer Comment  ?Room service appropriate? Yes   ?Fluid consistency: Thin   ?  ? 09/03/21 0940  ? ?  ?  ? ?  ? ? ? ?Brief Narrative / Hospital Course:  ?Brought to ED 09/02/2021 with concern for lower extremity edema, increasing confusion.  Noted to have AKI, likely due to increased dose of Lasix at home.  Concern for increasing confusion, in ED patient received IV fluids, started on antibiotics for aspiration pneumonia given CT results showing right middle lobe pneumonia with significant bullous emphysema.  Po home meds held pending speech evaluation, which was later passed.  MRI brain showed no acute process but evidence of old infarcts, chronic microvascular change.  09/03/2021, echocardiogram pending, patient saturating well on room air.  Pending PT/OT evaluation, suspect will need home health and possibly home O2 on ambulation.  Continuing IV antibiotics for aspiration  pneumonia. Respiratory panel PCR negative. [place] discussion with family, see A/P.  09/04/2021: Mental status is significantly improved, patient is alert, oriented to person, place, time (month, year), situation.  Some improvement in creatinine, 1.69-1.5.  Some elevation in WBC, as expected with steroids.  Echocardiogram EF 60 to 10% grade 1 diastolic dysfunction.  No stenosis on carotid ultrasound. SpO2 good on RA.  Neurology has again evaluated the patient, likely multifactorial delirium, low concern for seizures and patient unable to complete EEG.  Taper baclofen, recommend outpatient neurocognitive testing ?  ?  ?  ?Consultants:  ?Neurology ?  ?Procedures: ?none ?  ? ? ? ?Discharge Diagnoses: ?Active Problems: ?  Aspiration pneumonia ([place]) ?  COPD (chronic obstructive pulmonary disease) ([place]) ?  Chronic diastolic CHF (congestive heart failure) ([place]) ?  Essential hypertension ?  GERD (gastroesophageal reflux disease) ?  Coronary artery disease involving native coronary artery of native heart with angina pectoris ([place]) ?  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  Multiple falls ?  Acute kidney injury superimposed on chronic kidney disease ([place]) ?  Goals of care, counseling/discussion ?  Altered mental state ?  SOB (shortness of breath) ? ? ? ?Assessment & Plan: ?Bilateral leg edema ?. ? ?Cough ?. ? ?SOB (shortness of breath) ?. ? ?Aspiration pneumonia ([place]) ?CT Chest 05/12: Dense right middle lobe airspace consolidation consistent with pneumonia. 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 ?Continue ceftriaxone, received ceftriaxone and azithromycin in  ED ?O2 prn ?Aspiration precautions ?ST has seen patient, cleared for po intake   ?Discharging on clindamycin given allergy to PCN precludes Augmentin Rx  ? ?COPD (chronic obstructive pulmonary disease) ([place]) ?CT Chest 05/12: Dense right middle lobe airspace consolidation consistent with pneumonia. Severe underlying lung disease with bullous  emphysema ?CT chest images personally reviewed with daughter, impressive bullous emphysema ?treat for COPD exacerbation: solumedrol 40 mg IV daily, duonebs q6h scheduled, q2h prn ?Discharging on steroid taper and continue home inhalers, duonebs prn  ? ?Chronic diastolic CHF (congestive heart failure) ([place]) ?Cleared for p.o. intake, resumed home meds after BP improved 05/14 ?Echo 05/13 stable EF 60-65% G1DDF ? ?Essential hypertension ?Patient has been normotensive or even soft BP, will hold home meds ? ?GERD (gastroesophageal reflux disease) ?Continue PPI. ? ?Coronary artery disease involving native coronary artery of native heart with angina pectoris ([place]) ?Continue isosorbide, beta-blocker, ACE/ARB, spironolactone as BP permits. ? ?Multiple falls ?PT/OT recommending HH ? ?Acute kidney injury superimposed on chronic kidney disease ([place]) ?Improved/resolved to baseline. ? ?Altered mental state ?Most likely secondary to acute illness, some concern for progression of vascular dementia ?Improved on HD2 and stable on HD3 ?Cleared by neurology for outpatient workup  ?MRI brain reassuring ? ? 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 ?Goals of care, counseling/discussion ?Discussed with daughter and son-in-law in the ED 09/03/21, showed images from CT chest, discussed comorbidities: given significant pulmonary disease, cardiac disease,  concern for advancing dementia, I counseled that I do not believe this patient would survive CPR/intubation, or if she did survive such treatments for cardiac/respiratory arrest she would be unlikely to ever come off life support and regain any meaningful QOL.  Daughter states she will consider this, I advised strongly consider DNR, daughter will think about it, will revisit conversation at a later time ? ? ? ? ? ?Discharge Instructions ? ?Discharge Instructions   ? ? Diet - low sodium heart healthy   Complete by: As directed ?  ? Diet - low sodium heart healthy   Complete by: As directed ?  ? Discharge instructions   Complete by: As directed ?  ? Reduce Baclofen to 5 mg every evening  ? Discharge instructions   Complete by: As directed ?  ? Finish antibiotics for aspiration pneumonia ?Finish steroid taper for COPD ?Reduce Baclofen to 5 mg per night and try to come off this medication as directed (see medication list)  ? Increase activity slowly   Complete by: As directed ?  ? Increase activity slowly   Complete by: As directed ?  ? ?  ? ?Allergies as of 09/05/2021   ? ?   Reactions  ? Nsaids   ? Bleeding risk  ? Penicillins Anaphylaxis, Swelling, Rash  ? Has patient had a PCN reaction causing immediate rash, facial/tongue/throat swelling, SOB or lightheadedness with hypotension: Yes ?Has patient had a PCN reaction causing severe rash involving mucus membranes or skin necrosis: No ?Has patient had a PCN reaction that  required hospitalization: No  ?Has patient had a PCN reaction occurring within the last 10 years: No ?If all of the above answers are "NO", then may proceed with Cephalosporin use.  ? Tamsulosin Nausea Only  ? Atorvastatin Other (See Comments)  ? Muscle Pain  ? Gabapentin Swelling  ? Ezetimibe Other (See Comments)  ? Muscle pain  ? Aspirin   ? GI bleeding risk  ? ?  ? ?  ?Medication List  ?  ? ?TAKE these medications   ? ?Acetaminophen 8 Hour 650 MG CR tablet ?Generic drug: acetaminophen ?Take 650 mg by  mouth every 8 (eight) hours as needed for pain. ?  ?albuterol 108 (90 Base) MCG/ACT inhaler ?Commonly known as: VENTOLIN HFA ?Inhale 2 puffs into the lungs every 6 (six) hours as needed for wheezing or s

## 2021-09-05 NOTE — TOC Transition Note (Signed)
Transition of Care (TOC) - CM/SW Discharge Note ? ? ?Patient Details  ?Name: Mirjana Tarleton ?MRN: [id number] ?Date of Birth: March 29, 1941 ? ?Transition of Care (TOC) CM/SW Contact:  ?Candie Chroman, LCSW ?Phone Number: ?09/05/2021, 2:13 PM ? ? ?Clinical Narrative:   Patient has orders to discharge home today. [facility] accepted referral for PT, OT, RN. Patient and daughter are aware. No further concerns. CSW signing off. ? ?Final next level of care: [place] ?Barriers to Discharge: No Barriers Identified ? ? ?Patient Goals and CMS Choice ?  ?CMS Medicare.gov Compare Post Acute Care list provided to:: Patient ?Choice offered to / list presented to : Patient, Adult Children ? ?Discharge Placement ?  ?           ?  ?Patient to be transferred to facility by: Daughter ?Name of family member notified: Thomasena Edis ?Patient and family notified of of transfer: 09/05/21 ? ?Discharge Plan and Services ?  ?  ?Post Acute Care Choice: Home Health          ?  ?  ?  ?  ?  ?HH Arranged: RN, PT, OT ?[place] Agency: Other - See comment ([place]) ?Date HH Agency Contacted: 09/05/21 ?  ?Representative spoke with at [place]: Graciella Belton and Janace Hoard ? ?Social Determinants of Health (SDOH) Interventions ?  ? ? ?Readmission Risk Interventions ? ?  09/05/2021  ? 11:43 AM 11/17/2020  ?  2:04 PM 11/15/2020  ?  4:03 PM  ?Readmission Risk Prevention Plan  ?Transportation Screening Complete Complete Complete  ?Medication Review Press photographer) Complete Complete Complete  ?PCP or Specialist appointment within 3-5 days of discharge Complete    ?[place] or Home Care Consult   Complete  ?SW Recovery Care/Counseling Consult Complete Complete Complete  ?Palliative Care Screening Not Applicable Complete Complete  ?[place] Not Applicable Complete Patient Refused  ? ? ? ? ? ?

## 2021-09-05 NOTE — Plan of Care (Signed)

## 2021-09-05 NOTE — Progress Notes (Signed)
This morning patient called out to use the restroom. While getting her back into bed, she began talking to the ceiling calling out for Shaun. I asked her who Alverda Skeans was and if he was alive. She stated that he was dead. She then began talking to the pictures of her great grandchildren in the room telling them to come sit beside her. Behaviors were not present during the night.She has not slept any. ?

## 2021-09-08 LAB — VITAMIN B1: Vitamin B1 (Thiamine): 200.6 nmol/L — ABNORMAL HIGH (ref 66.5–200.0)

## 2021-09-09 LAB — METHYLMALONIC ACID, SERUM: Methylmalonic Acid, Quantitative: 1117 nmol/L — ABNORMAL HIGH (ref 0–378)

## 2021-09-13 ENCOUNTER — Encounter: Payer: Self-pay | Admitting: Neurology

## 2021-09-28 ENCOUNTER — Ambulatory Visit (INDEPENDENT_AMBULATORY_CARE_PROVIDER_SITE_OTHER): Payer: Medicare Other

## 2021-09-28 DIAGNOSIS — I25119 Atherosclerotic heart disease of native coronary artery with unspecified angina pectoris: Secondary | ICD-10-CM

## 2021-09-28 LAB — ECHOCARDIOGRAM COMPLETE
AR max vel: 2.32 cm2
AV Area VTI: 2.5 cm2
AV Area mean vel: 2.57 cm2
AV Mean grad: 3 mmHg
AV Peak grad: 5.6 mmHg
Ao pk vel: 1.18 m/s
Area-P 1/2: 3.53 cm2
Calc EF: 49.6 %
S' Lateral: 3 cm
Single Plane A2C EF: 49.5 %
Single Plane A4C EF: 50.5 %

## 2021-09-30 ENCOUNTER — Telehealth: Payer: Self-pay | Admitting: Emergency Medicine

## 2021-09-30 NOTE — Telephone Encounter (Signed)
-----   Message from Minna Merritts, MD sent at 09/28/2021  7:04 PM EDT ----- Echocardiogram Normal left and right ventricular size and function, no significant valvular heart disease, normal pressures Overall good study

## 2021-09-30 NOTE — Telephone Encounter (Signed)
Called patient. No answer. Detailed message left per DPR 

## 2021-10-24 ENCOUNTER — Other Ambulatory Visit: Payer: Self-pay | Admitting: Cardiovascular Disease

## 2021-11-01 ENCOUNTER — Emergency Department
Admission: EM | Admit: 2021-11-01 | Discharge: 2021-11-01 | Disposition: A | Discharge status: Home / Self Care | Payer: Medicare Other | Attending: Emergency Medicine | Admitting: Emergency Medicine

## 2021-11-01 ENCOUNTER — Other Ambulatory Visit: Payer: Self-pay

## 2021-11-01 DIAGNOSIS — Y92410 Unspecified street and highway as the place of occurrence of the external cause: Secondary | ICD-10-CM | POA: Insufficient documentation

## 2021-11-01 DIAGNOSIS — Z041 Encounter for examination and observation following transport accident: Secondary | ICD-10-CM | POA: Diagnosis present
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
## 2021-11-01 NOTE — ED Notes (Signed)
D/C and OTC meds discussed with pt, pt verbalized understanding. NAD noted on D/C.
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
## 2021-11-01 NOTE — ED Notes (Signed)
Pt D/C'ed at [employer]

## 2021-11-05 ENCOUNTER — Other Ambulatory Visit: Payer: Self-pay | Admitting: Cardiovascular Disease

## 2021-11-10 ENCOUNTER — Ambulatory Visit: Payer: Medicare Other | Admitting: Oncology

## 2021-11-10 ENCOUNTER — Encounter: Payer: Self-pay | Admitting: Medical Oncology

## 2021-11-10 ENCOUNTER — Inpatient Hospital Stay: Payer: Medicare Other | Attending: Oncology | Admitting: Medical Oncology

## 2021-11-10 VITALS — BP 160/76 | HR 73 | Temp 98.7°F | Resp 20 | Wt 139.0 lb

## 2021-11-10 DIAGNOSIS — Z923 Personal history of irradiation: Secondary | ICD-10-CM | POA: Diagnosis not present

## 2021-11-10 DIAGNOSIS — E876 Hypokalemia: Secondary | ICD-10-CM | POA: Insufficient documentation

## 2021-11-10 DIAGNOSIS — C50411 Malignant neoplasm of upper-outer quadrant of right female breast: Secondary | ICD-10-CM | POA: Insufficient documentation

## 2021-11-10 DIAGNOSIS — Z171 Estrogen receptor negative status [ER-]: Secondary | ICD-10-CM | POA: Diagnosis not present

## 2021-11-10 DIAGNOSIS — Z79811 Long term (current) use of aromatase inhibitors: Secondary | ICD-10-CM | POA: Insufficient documentation

## 2021-11-10 DIAGNOSIS — D509 Iron deficiency anemia, unspecified: Secondary | ICD-10-CM | POA: Diagnosis not present

## 2021-11-11 ENCOUNTER — Encounter: Payer: Self-pay | Admitting: Oncology

## 2021-11-11 NOTE — Progress Notes (Signed)
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  INTERVAL HISTORY: [name] returns to clinic today for routine 73-month evaluation for letrozole and breast cancer monitoring.  She reports that she is tolerating her Letrozole well. She is asymptomatic. UTD on mammograms. She has no neurologic complaints. She denies any recent fevers or illnesses.  She denies any chest pain, shortness of breath, cough, or hemoptysis.  She denies any nausea, vomiting, constipation, or diarrhea.  She denies any melena or hematochezia.  She has no urinary complaints.  Patient offers no specific complaints today.  REVIEW OF SYSTEMS:   Review of Systems  Constitutional: Negative.  Negative for fever and weight loss.  Respiratory: Negative.  Negative for cough and shortness of breath.   Cardiovascular: Negative.  Negative for chest pain and leg swelling.  Gastrointestinal: Negative.  Negative for abdominal pain, constipation, diarrhea and nausea.  Genitourinary: Negative.  Negative for dysuria and hematuria.  Musculoskeletal: Negative.  Negative for joint pain.  Skin: Negative.  Negative for rash.  Neurological: Negative.  Negative for sensory change, focal weakness, weakness and headaches.  Psychiatric/Behavioral: Negative.  The patient is not nervous/anxious.     As per HPI. Otherwise, a complete review of systems is negative.  PAST MEDICAL HISTORY: Past Medical History:  Diagnosis Date   Anxiety    Arthritis    CAD  (coronary artery disease)    a. NSTEMI 12/19; b. LHC 04/10/18: pLAD 95%, mLAD 80%, m-dLCx 95%, OM3-1 lesion 40%, OM3-2 lesion 60%, mRCA 50%, EF 35-45%, successful PCI/DES x 2 to the LAD with recommended staged PCI of the LCx in a few weeks   Cancer (HCC)    Right Breast Cancer   Chronic systolic CHF (congestive heart failure) (HCC)    a. TTE 12/19: EF 30-35%, anteroseptal, anterior, and apical HK, Gr1DD, mild to mod MR, mildly dilated LA, RVSF nl   COPD (chronic obstructive pulmonary disease) (HCC)    Depression    Dyspnea    with exertion   GERD (gastroesophageal reflux disease)    Hyperlipidemia    Hypertension    Personal history of chemotherapy 2018   chemo prior to lumpectomy of right breast   Personal history of radiation therapy 2019   right breast ca    PAST SURGICAL HISTORY: Past Surgical History:  Procedure Laterality Date   ABDOMINAL HYSTERECTOMY  1990   Partial   BREAST BIOPSY Right 10/04/2016   axilla lymph node (metastatic carcinoma) and axillay tail mass biopsy-invasive mammary carcinoma   BREAST LUMPECTOMY Right 03/21/2017   chemo first, [facility] and metastatic LN   BREAST LUMPECTOMY Right 05/21/2017   re-excision for clip   BREAST LUMPECTOMY WITH NEEDLE LOCALIZATION Right 03/21/2017   Procedure: BREAST LUMPECTOMY WITH NEEDLE LOCALIZATION;  Surgeon: Ricarda Frame, MD;  Location: [facility];  Service: General;  Laterality: Right;   CORONARY STENT INTERVENTION N/A 04/10/2018   Procedure: CORONARY STENT INTERVENTION;  Surgeon: Iran Ouch, MD;  Location: [facility];  Service: Cardiovascular;  Laterality: N/A;   CORONARY STENT INTERVENTION N/A 05/14/2018  Procedure: CORONARY STENT INTERVENTION;  Surgeon: Nelva Bush, MD;  Location: [place] [facility];  Service: Cardiovascular;  Laterality: N/A;   DILATION AND CURETTAGE OF UTERUS     INGUINAL HERNIA REPAIR Right 03/26/2017   Procedure: HERNIA REPAIR INGUINAL INCARCERATED;  Surgeon: Jules Husbands,  MD;  Location: [facility];  Service: General;  Laterality: Right;   LEFT HEART CATH AND CORONARY ANGIOGRAPHY N/A 04/10/2018   Procedure: LEFT HEART CATH AND CORONARY ANGIOGRAPHY poss PCI;  Surgeon: Minna Merritts, MD;  Location: [place] [facility];  Service: Cardiovascular;  Laterality: N/A;   PORTA CATH REMOVAL N/A 12/06/2020   Procedure: PORTA CATH REMOVAL;  Surgeon: Algernon Huxley, MD;  Location: [place] [facility];  Service: Cardiovascular;  Laterality: N/A;   PORTACATH PLACEMENT Left 10/24/2016   Procedure: INSERTION PORT-A-CATH;  Surgeon: [name], MD;  Location: [facility];  Service: General;  Laterality: Left;   RE-EXCISION OF BREAST LUMPECTOMY Right 05/21/2017   Procedure: RE-EXCISION OF BREAST LUMPECTOMY;  Surgeon: Clayburn Pert, MD;  Location: [facility];  Service: General;  Laterality: Right;   SENTINEL NODE BIOPSY Right 03/21/2017   Procedure: SENTINEL NODE BIOPSY;  Surgeon: Clayburn Pert, MD;  Location: [facility];  Service: General;  Laterality: Right;    FAMILY HISTORY: Family History  Problem Relation Age of Onset   Colon cancer Mother    Breast cancer Neg Hx     ADVANCED DIRECTIVES (Y/N):  N  HEALTH MAINTENANCE: Social History   Tobacco Use   Smoking status: Former    Packs/day: 0.50    Types: Cigarettes    Quit date: 07/23/1988    Years since quitting: 33.3   Smokeless tobacco: Never  Vaping Use   Vaping Use: Never used  Substance Use Topics   Alcohol use: Yes    Comment: rare   Drug use: No     Colonoscopy:  PAP:  Bone density:  Lipid panel:  Allergies  Allergen Reactions   Nsaids     Bleeding risk   Penicillins Anaphylaxis, Swelling and Rash    Has patient had a PCN reaction causing immediate rash, facial/tongue/throat swelling, SOB or lightheadedness with hypotension: Yes Has patient had a PCN reaction causing severe rash involving mucus membranes or skin necrosis: No Has patient had a PCN reaction that required hospitalization: No  Has  patient had a PCN reaction occurring within the last 10 years: No If all of the above answers are "NO", then may proceed with Cephalosporin use.    Tamsulosin Nausea Only   Atorvastatin Other (See Comments)    Muscle Pain   Gabapentin Swelling   Ezetimibe Other (See Comments)    Muscle pain   Aspirin     GI bleeding risk    Current Outpatient Medications  Medication Sig Dispense Refill   acetaminophen (ACETAMINOPHEN 8 HOUR) 650 MG CR tablet Take 650 mg by mouth every 8 (eight) hours as needed for pain.     albuterol (PROVENTIL HFA;VENTOLIN HFA) 108 (90 Base) MCG/ACT inhaler Inhale 2 puffs into the lungs every 6 (six) hours as needed for wheezing or shortness of breath.     carvedilol (COREG) 6.25 MG tablet Take 6.25 mg by mouth 2 (two) times daily.     cholecalciferol (VITAMIN D3) 25 MCG (1000 UT) tablet Take 1,000 Units by mouth daily.     clopidogrel (PLAVIX) 75 MG tablet TAKE 1 TABLET (75 MG TOTAL) BY MOUTH DAILY WITH BREAKFAST. 90 tablet 3   Coenzyme Q10 300 MG  CAPS Take 300 mg by mouth daily.      DULoxetine (CYMBALTA) 30 MG capsule Take 30 mg by mouth daily. (Take with $RemoveBe'60mg'inFBWzfID$  capsule to equal $Remove'90mg'QJJXAlH$ )  11   DULoxetine (CYMBALTA) 60 MG capsule Take 60 mg by mouth daily. (Take with $RemoveBe'30mg'hHsJRFyQH$  capsule to equal $Remove'90mg'jOeitZL$ )     enalapril (VASOTEC) 5 MG tablet TAKE 1 TABLET (5 MG TOTAL) BY MOUTH DAILY. 90 tablet 3   fexofenadine (ALLEGRA) 180 MG tablet Take 180 mg by mouth daily.     fluticasone (FLONASE) 50 MCG/ACT nasal spray Place 2 sprays into the nose 2 (two) times daily as needed for allergies or rhinitis.     fluticasone furoate-vilanterol (BREO ELLIPTA) 200-25 MCG/INH AEPB Inhale 1 puff into the lungs daily.     isosorbide mononitrate (IMDUR) 30 MG 24 hr tablet Take 15 mg by mouth daily.     letrozole (FEMARA) 2.5 MG tablet TAKE 1 TABLET BY MOUTH EVERY DAY (Patient taking differently: Take 2.5 mg by mouth daily.) 90 tablet 1   Magnesium Oxide 250 MG TABS Take 250 mg by mouth daily.     melatonin  5 MG TABS Take 0.5 tablets (2.5 mg total) by mouth at bedtime as needed. 30 tablet 0   montelukast (SINGULAIR) 10 MG tablet Take 10 mg by mouth at bedtime.      pantoprazole (PROTONIX) 40 MG tablet Take 1 tablet (40 mg total) by mouth daily. 90 tablet 0   rosuvastatin (CRESTOR) 20 MG tablet TAKE 1 TABLET BY MOUTH EVERY DAY AT 6PM (Patient taking differently: Take 20 mg by mouth daily.) 90 tablet 3   spironolactone (ALDACTONE) 25 MG tablet TAKE 1 TABLET BY MOUTH EVERY DAY 90 tablet 0   tiotropium (SPIRIVA) 18 MCG inhalation capsule Place 18 mcg into inhaler and inhale daily.     feeding supplement (ENSURE ENLIVE / ENSURE PLUS) LIQD Take 237 mLs by mouth daily. (Patient not taking: Reported on 09/02/2021)     furosemide (LASIX) 20 MG tablet Take 1 tablet (20 mg total) by mouth daily as needed for edema. (Patient not taking: Reported on 09/02/2021) 30 tablet 11   HYDROcodone bit-homatropine (HYCODAN) 5-1.5 MG/5ML syrup Take 5 mLs by mouth every 6 (six) hours as needed. (Patient not taking: Reported on 09/02/2021)     lidocaine-prilocaine (EMLA) cream Apply 1 application. topically as needed (for port access). (Patient not taking: Reported on 09/02/2021)     No current facility-administered medications for this visit.   Facility-Administered Medications Ordered in Other Visits  Medication Dose Route Frequency Provider Last Rate Last Admin   heparin lock flush 100 unit/mL  500 Units Intravenous Once Lloyd Huger, MD       heparin lock flush 100 unit/mL  500 Units Intravenous Once Lloyd Huger, MD       ondansetron [facility]) 8 mg in sodium chloride 0.9 % 50 mL IVPB   Intravenous Once Lloyd Huger, MD        OBJECTIVE: Vitals:   11/10/21 1358  BP: (!) 160/76  Pulse: 73  Resp: 20  Temp: 98.7 F (37.1 C)  SpO2: 96%     Body mass index is 25.42 kg/m.    ECOG FS:0 - Asymptomatic  General: Well-developed, well-nourished, no acute distress. Eyes: Pink conjunctiva, anicteric  sclera. HEENT: Normocephalic, moist mucous membranes. Breasts: Exam deferred today. Lungs: No audible wheezing or coughing. Heart: Regular rate and rhythm. Abdomen: Soft, nontender, no obvious distention. Musculoskeletal: No edema, cyanosis, or clubbing. Neuro: Alert, answering  all questions appropriately. Cranial nerves grossly intact. Skin: No rashes or petechiae noted. Psych: Normal affect.   LAB RESULTS:  Lab Results  Component Value Date   NA 142 09/05/2021   K 4.6 09/05/2021   CL 109 09/05/2021   CO2 25 09/05/2021   GLUCOSE 104 (H) 09/05/2021   BUN 46 (H) 09/05/2021   CREATININE 1.52 (H) 09/05/2021   CALCIUM 9.2 09/05/2021   PROT 7.6 09/03/2021   ALBUMIN 3.4 (L) 09/03/2021   AST 18 09/03/2021   ALT 9 09/03/2021   ALKPHOS 51 09/03/2021   BILITOT 0.4 09/03/2021   GFRNONAA 34 (L) 09/05/2021   GFRAA 39 (L) 12/01/2019    Lab Results  Component Value Date   WBC 13.3 (H) 09/05/2021   NEUTROABS 5.7 09/02/2021   HGB 11.3 (L) 09/05/2021   HCT 37.9 09/05/2021   MCV 80.8 09/05/2021   PLT 347 09/05/2021   Lab Results  Component Value Date   IRON 41 05/12/2021   TIBC 412 05/12/2021   IRONPCTSAT 10 (L) 05/12/2021   Lab Results  Component Value Date   FERRITIN 15 05/12/2021     STUDIES: No results found.  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  PLAN:    1. 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  Treatment was discontinued secondary to declining performance status.  Patient underwent lumpectomy on March 21, 2017, but no biopsy clip was seen in her surgical specimen.  She had repeat surgery on May 21, 2017 to remove her retained clip, which confirmed a complete pathological response. Patient completed her adjuvant XRT on  September 27, 2017.  Continue letrozole for total 5 years completing treatment in February 2024.  TODAY: Her recent mammogram was benign. Repeat bilateral mammogram in March 2024.  Return to clinic in 6 months for routine evaluation.  2. Hypokalemia: Chronic but resolved. Recent potassium 4.4. Continue 20 mEq oral potassium supplementation daily. 3.  Iron deficiency anemia: Hemoglobin stable- most recent 11.4 Pt elected to not have labs today but future orders placed.  4.  Bone health: Bone mineral density on January 01, 2020 reported T score of -0.8 which is considered normal. TODAY: We reviewed this information. Encouraged daily  calcium and vitamin D supplementation.  Repeat DEXA in September 2023.     Patient expressed understanding and was in agreement with this plan. She also understands that She can call clinic at any time with any questions, concerns, or complaints.    Cancer Staging  Malignant neoplasm of right female breast [facility]) Staging form: Breast, AJCC 8th Edition - Clinical stage from 10/16/2016: Stage IIB (cT2, cN1, cM0, G3, ER-, PR+, HER2+) - Signed by Lloyd Huger, MD on 10/16/2016 Histologic grading system: 3 grade system Laterality: Right   Hughie Closs, PA-C   11/11/2021 5:25 PM

## 2021-11-15 ENCOUNTER — Other Ambulatory Visit: Payer: Self-pay | Admitting: Cardiovascular Disease

## 2021-11-15 DIAGNOSIS — I2510 Atherosclerotic heart disease of native coronary artery without angina pectoris: Secondary | ICD-10-CM

## 2021-11-17 ENCOUNTER — Telehealth: Payer: Self-pay | Admitting: Cardiovascular Disease

## 2021-11-17 MED ORDER — CLOPIDOGREL BISULFATE 75 MG PO TABS: 75.0000 mg | ORAL_TABLET | Freq: Every day | ORAL | 0 refills | Status: DC

## 2021-11-17 NOTE — Telephone Encounter (Signed)
Requested Prescriptions   Signed Prescriptions Disp Refills   clopidogrel (PLAVIX) 75 MG tablet 90 tablet 0    Sig: Take 1 tablet (75 mg total) by mouth daily with breakfast.    Authorizing Provider: Minna Merritts    Ordering User: Britt Bottom

## 2021-11-17 NOTE — Telephone Encounter (Signed)
*  STAT* If patient is at the pharmacy, call can be transferred to refill team.   1. Which medications need to be refilled? (please list name of each medication and dose if known)   clopidogrel (PLAVIX) 75 MG tablet  2. Which pharmacy/location (including street and city if local pharmacy) is medication to be sent to?  [id number] [place] - [address]  3. Do they need a 30 day or 90 day supply?   90 day  Patient states that she is out of this medication.

## 2021-12-06 ENCOUNTER — Ambulatory Visit: Payer: Medicare Other | Admitting: Cardiovascular Disease

## 2021-12-16 ENCOUNTER — Other Ambulatory Visit: Payer: Medicare Other

## 2021-12-21 ENCOUNTER — Other Ambulatory Visit: Payer: Medicare Other

## 2021-12-26 ENCOUNTER — Other Ambulatory Visit: Payer: Self-pay | Admitting: Oncology

## 2021-12-27 ENCOUNTER — Encounter: Payer: Self-pay | Admitting: Oncology

## 2022-01-18 ENCOUNTER — Encounter: Payer: Self-pay | Admitting: Cardiovascular Disease

## 2022-01-18 ENCOUNTER — Ambulatory Visit: Payer: Medicare Other | Attending: Cardiovascular Disease | Admitting: Cardiovascular Disease

## 2022-01-18 VITALS — BP 112/68 | HR 72 | Ht 62.0 in | Wt 145.4 lb

## 2022-01-18 DIAGNOSIS — I25119 Atherosclerotic heart disease of native coronary artery with unspecified angina pectoris: Secondary | ICD-10-CM | POA: Diagnosis not present

## 2022-01-18 DIAGNOSIS — I255 Ischemic cardiomyopathy: Secondary | ICD-10-CM | POA: Diagnosis not present

## 2022-01-18 DIAGNOSIS — I10 Essential (primary) hypertension: Secondary | ICD-10-CM

## 2022-01-18 DIAGNOSIS — R0602 Shortness of breath: Secondary | ICD-10-CM | POA: Diagnosis not present

## 2022-01-18 DIAGNOSIS — E782 Mixed hyperlipidemia: Secondary | ICD-10-CM

## 2022-01-18 DIAGNOSIS — I5032 Chronic diastolic (congestive) heart failure: Secondary | ICD-10-CM

## 2022-01-18 MED ORDER — ISOSORBIDE MONONITRATE ER 30 MG PO TB24: 15.0000 mg | ORAL_TABLET | Freq: Every day | ORAL | 3 refills | Status: DC

## 2022-01-18 NOTE — Progress Notes (Signed)
Cardiology Office Note  Date:  01/18/2022   ID:  Krista Cisneros, DOB [birth year], MRN [id number]  PCP:  Sharyne Peach, MD   Chief Complaint  Patient presents with   3 month follow up     Discuss Echo results. The patient never wore the [facility] monitor. Patient c/o bilateral LE edema. Medications reviewed by the patient verbally.     HPI: Krista Cisneros is a 81 y.o. 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 Flemiing, 30 day supply Thick cough the office today, Bronchiectasis, no significant sputum production And nebulizer twice a day Sitting in recliner Reports she is taking Lasix daily, despite this has mild ankle swelling  EKG personally reviewed by myself on todays visit Normal sinus rhythm rate 72 bpm no significant ST-T wave changes  Head MRI reviewed from the ER  1. No acute intracranial abnormality. 2. Multiple old cerebellar infarcts and findings of chronic small vessel disease.  Past medical history reviewed COVID December 2021 Diagnosis of  bronchopneumonia June 20, 2020 on Levaquin  Last echocardiogram June 2021, ejection fraction 50 to 55%  cath 04/10/18 Multivessel disease Severe mid LAD disease 95%, mid to distal stenosis as well 80% Proximal OM 3 disease large vessel 95% Moderate RCA disease 50% mid vessel  Successful angioplasty and drug-eluting stent placement to the LAD x2.  1. 90% mid LCx stenosis, unchanged from catheterization last month. 2. Widely patent proximal and mid LAD stents. 3. Successful PCI to mid LCx using Resolute Onyx 2.25 x 15 mm drug-eluting stent (postdilated to 2.6 mm) with 0% residual stenosis and TIMI-3 flow.   On 05/14/2018 NSTEMI along with chest pain and in worsening fatigue.   cardiac catheterization results detailed below, discussed with her 2 stents to the LAD, staged procedure with follow-up stent to the left circumflex  Ejection fraction of 30 to 35% by echocardiogram April 10, 2018   PMH:   has a past medical history of Anxiety, Arthritis, CAD (coronary artery disease), Cancer ([place]), Chronic systolic CHF (congestive heart failure) ([place]), COPD (chronic obstructive pulmonary disease) ([place]), Depression, Dyspnea, GERD (gastroesophageal reflux disease), Hyperlipidemia, Hypertension, Personal history of chemotherapy (2018), and Personal history of radiation therapy (2019).  PSH:    Past Surgical History:  Procedure Laterality Date   ABDOMINAL HYSTERECTOMY  1990   Partial   BREAST BIOPSY Right 10/04/2016   axilla lymph node (metastatic carcinoma) and axillay tail mass biopsy-invasive mammary carcinoma   BREAST LUMPECTOMY Right 03/21/2017   chemo first, [facility] and metastatic LN   BREAST LUMPECTOMY  Right 05/21/2017   re-excision for clip   BREAST LUMPECTOMY WITH NEEDLE LOCALIZATION Right 03/21/2017   Procedure: BREAST LUMPECTOMY WITH NEEDLE LOCALIZATION;  Surgeon: Clayburn Pert, MD;  Location: [facility] ORS;  Service: General;  Laterality: Right;   CORONARY STENT INTERVENTION N/A  04/10/2018   Procedure: CORONARY STENT INTERVENTION;  Surgeon: Wellington Hampshire, MD;  Location: [place] [facility];  Service: Cardiovascular;  Laterality: N/A;   CORONARY STENT INTERVENTION N/A 05/14/2018   Procedure: CORONARY STENT INTERVENTION;  Surgeon: Nelva Bush, MD;  Location: [place] [facility];  Service: Cardiovascular;  Laterality: N/A;   DILATION AND CURETTAGE OF UTERUS     INGUINAL HERNIA REPAIR Right 03/26/2017   Procedure: HERNIA REPAIR INGUINAL INCARCERATED;  Surgeon: Jules Husbands, MD;  Location: [facility] OR[facility];  Service: General;  Laterality: Right;   LEFT HEART CATH AND CORONARY ANGIOGRAPHY N/A 04/10/2018   Procedure: LEFT HEART CATH AND CORONARY ANGIOGRAPHY poss PCI;  Surgeon: Minna Merritts, MD;  Location: [place] [facility];  Service: Cardiovascular;  Laterality: N/A;   PORTA CATH REMOVAL N/A 12/06/2020   Procedure: PORTA CATH REMOVAL;  Surgeon: Algernon Huxley, MD;  Location: [place] [facility];  Service: Cardiovascular;  Laterality: N/A;   PORTACATH PLACEMENT Left 10/24/2016   Procedure: INSERTION PORT-A-CATH;  Surgeon: [name], MD;  Location: [facility];  Service: General;  Laterality: Left;   RE-EXCISION OF BREAST LUMPECTOMY Right 05/21/2017   Procedure: RE-EXCISION OF BREAST LUMPECTOMY;  Surgeon: Clayburn Pert, MD;  Location: [facility];  Service: General;  Laterality: Right;   SENTINEL NODE BIOPSY Right 03/21/2017   Procedure: SENTINEL NODE BIOPSY;  Surgeon: Clayburn Pert, MD;  Location: [facility];  Service: General;  Laterality: Right;    Current Outpatient Medications  Medication Sig Dispense Refill   acetaminophen (ACETAMINOPHEN 8 HOUR) 650 MG CR tablet Take 650 mg by mouth every 8 (eight) hours as needed for pain.     albuterol (PROVENTIL HFA;VENTOLIN HFA) 108 (90 Base) MCG/ACT inhaler Inhale 2 puffs into the lungs every 6 (six) hours as needed for wheezing or shortness of breath.     carvedilol (COREG) 6.25 MG tablet Take 6.25 mg by mouth 2 (two) times  daily.     cholecalciferol (VITAMIN D3) 25 MCG (1000 UT) tablet Take 1,000 Units by mouth daily.     clopidogrel (PLAVIX) 75 MG tablet Take 1 tablet (75 mg total) by mouth daily with breakfast. 90 tablet 0   Coenzyme Q10 300 MG CAPS Take 300 mg by mouth daily.      DULoxetine (CYMBALTA) 30 MG capsule Take 30 mg by mouth daily. (Take with '60mg'$  capsule to equal '90mg'$ )  11   DULoxetine (CYMBALTA) 60 MG capsule Take 60 mg by mouth daily. (Take with '30mg'$  capsule to equal '90mg'$ )     enalapril (VASOTEC) 5 MG tablet TAKE 1 TABLET (5 MG TOTAL) BY MOUTH DAILY. 90 tablet 3   feeding supplement (ENSURE ENLIVE / ENSURE PLUS) LIQD Take 237 mLs by mouth daily.     fexofenadine (ALLEGRA) 180 MG tablet Take 180 mg by mouth daily.     fluticasone (FLONASE) 50 MCG/ACT nasal spray Place 2 sprays into the nose 2 (two) times daily as needed for allergies or rhinitis.     fluticasone furoate-vilanterol (BREO ELLIPTA) 200-25 MCG/INH AEPB Inhale 1 puff into the lungs daily.     furosemide (LASIX) 20 MG tablet Take 1 tablet (20 mg total) by mouth daily as needed for edema. 30 tablet 11  HYDROcodone bit-homatropine (HYCODAN) 5-1.5 MG/5ML syrup Take 5 mLs by mouth every 6 (six) hours as needed.     letrozole (FEMARA) 2.5 MG tablet Take 1 tablet (2.5 mg total) by mouth daily. 90 tablet 0   lidocaine-prilocaine (EMLA) cream Apply 1 application  topically as needed (for port access).     Magnesium Oxide 250 MG TABS Take 250 mg by mouth daily.     melatonin 5 MG TABS Take 0.5 tablets (2.5 mg total) by mouth at bedtime as needed. 30 tablet 0   montelukast (SINGULAIR) 10 MG tablet Take 10 mg by mouth at bedtime.      pantoprazole (PROTONIX) 40 MG tablet TAKE 1 TABLET BY MOUTH EVERY DAY 90 tablet 3   predniSONE (DELTASONE) 5 MG tablet Take by mouth.     rosuvastatin (CRESTOR) 20 MG tablet TAKE 1 TABLET BY MOUTH EVERY DAY AT 6PM 90 tablet 3   spironolactone (ALDACTONE) 25 MG tablet TAKE 1 TABLET BY MOUTH EVERY DAY 90 tablet 3    tiotropium (SPIRIVA) 18 MCG inhalation capsule Place 18 mcg into inhaler and inhale daily.     isosorbide mononitrate (IMDUR) 30 MG 24 hr tablet Take 0.5 tablets (15 mg total) by mouth daily. 45 tablet 3   No current facility-administered medications for this visit.   Facility-Administered Medications Ordered in Other Visits  Medication Dose Route Frequency Provider Last Rate Last Admin   heparin lock flush 100 unit/mL  500 Units Intravenous Once Lloyd Huger, MD       heparin lock flush 100 unit/mL  500 Units Intravenous Once Lloyd Huger, MD       ondansetron [facility]) 8 mg in sodium chloride 0.9 % 50 mL IVPB   Intravenous Once Lloyd Huger, MD         Allergies:   Nsaids, Penicillins, Tamsulosin, Atorvastatin, Gabapentin, Ezetimibe, and Aspirin   Social History:  The patient  reports that she quit smoking about 33 years ago. Her smoking use included cigarettes. She smoked an average of .5 packs per day. She has never used smokeless tobacco. She reports current alcohol use. She reports that she does not use drugs.   Family History:   family history includes Colon cancer in her mother.    Review of Systems: Review of Systems  Constitutional: Negative.   HENT: Negative.    Respiratory:  Positive for cough. Negative for shortness of breath.   Cardiovascular: Negative.  Negative for chest pain.  Gastrointestinal: Negative.   Genitourinary: Negative.   Musculoskeletal: Negative.   Neurological: Negative.   Psychiatric/Behavioral: Negative.    All other systems reviewed and are negative.   PHYSICAL EXAM: VS:  BP 112/68 (BP Location: Left Arm, Patient Position: Sitting, Cuff Size: Normal)   Pulse 72   Ht '5\' 2"'$  (1.575 m)   Wt 145 lb 6 oz (65.9 kg)   SpO2 92%   BMI 26.59 kg/m  , BMI Body mass index is 26.59 kg/m.  Constitutional:  oriented to person, place, and time. No distress.  HENT:  Head: Grossly normal Eyes:  no discharge. No scleral icterus.  Neck:  No JVD, no carotid bruits  Cardiovascular: Regular rate and rhythm, no murmurs appreciated Trace ankle swelling Pulmonary/Chest: Clear to auscultation bilaterally, no wheezes or rails Abdominal: Soft.  no distension.  no tenderness.  Musculoskeletal: Normal range of motion Neurological:  normal muscle tone. Coordination normal. No atrophy Skin: Skin warm and dry Psychiatric: normal affect, pleasant  Recent Labs: 09/02/2021: B Natriuretic  Peptide 62.5; TSH 5.682 09/03/2021: ALT 9; Magnesium 2.3 09/05/2021: BUN 46; Creatinine, Ser 1.52; Hemoglobin 11.3; Platelets 347; Potassium 4.6; Sodium 142    Lipid Panel Lab Results  Component Value Date   CHOL 189 04/10/2018   HDL 40 (L) 04/10/2018   LDLCALC 134 (H) 04/10/2018   TRIG 77 04/10/2018     Wt Readings from Last 3 Encounters:  01/18/22 145 lb 6 oz (65.9 kg)  11/10/21 139 lb (63 kg)  09/02/21 141 lb 1.5 oz (64 kg)     ASSESSMENT AND PLAN:  Coronary artery disease involving native coronary artery of native heart with chronic stable angina pectoris (HCC) Currently with no symptoms of angina. No further workup at this time. Continue current medication regimen.  Non-STEMI (non-ST elevated myocardial infarction) ([place]) Prior ejection fraction 30 to 35% echocardiogram 09/2019 normal 50 to 55% Most recent echocardiogram performed last month EF greater than 55% No further cardiac testing needed  Centrilobular emphysema (HCC) Long history of smoking, followed by pulmonary Has been on prednisone past several weeks using nebulizers twice a day Chronic COPD with bronchiectasis, significant cough in exam room  CRI Followed by primary care Creatinine 1.5 ,recommend she try to avoid overdiuresis Given normal echocardiogram, recommend she hold Lasix twice a week  Debility Limited exercise capacity in the setting of chronic cough, shortness of breath/ED  Leg edema Minimal swelling around the ankles Recommend she continue Lasix 5 days  a week Ankle swelling worse from prednisone Leg elevation recommended, compression hose, echocardiogram with normal LV function, normal right heart pressures  History of cerebellar strokes Currently on Plavix She did not complete her Zio monitor  Total encounter time more than 30 minutes. Greater than 50% was spent in counseling and coordination of care with the patient.    Signed, Esmond Plants, M.D., Ph.D. 01/18/2022  [place], [place]

## 2022-01-18 NOTE — Patient Instructions (Signed)
Medication Instructions:  No changes  If you need a refill on your cardiac medications before your next appointment, please call your pharmacy.   Lab work: No new labs needed  Testing/Procedures: No new testing needed  Follow-Up: At [facility], you and your health needs are our priority.  As part of our continuing mission to provide you with exceptional heart care, we have created designated Provider Care Teams.  These Care Teams include your primary Cardiologist (physician) and Advanced Practice Providers (APPs -  Physician Assistants and Nurse Practitioners) who all work together to provide you with the care you need, when you need it.  You will need a follow up appointment in 12 months  Providers on your designated Care Team:   Christopher Berge, NP Ryan Dunn, PA-C Cadence Furth, PA-C  COVID-19 Vaccine Information can be found at: [email or web address] For questions related to vaccine distribution or appointments, please email [email or web address] or call [phone].   

## 2022-02-20 ENCOUNTER — Encounter (INDEPENDENT_AMBULATORY_CARE_PROVIDER_SITE_OTHER): Payer: Self-pay

## 2022-03-07 ENCOUNTER — Ambulatory Visit: Payer: Medicare Other | Admitting: Cardiovascular Disease

## 2022-04-10 ENCOUNTER — Other Ambulatory Visit: Payer: Self-pay | Admitting: Family

## 2022-04-10 ENCOUNTER — Other Ambulatory Visit: Payer: Self-pay | Admitting: Oncology

## 2022-04-10 NOTE — Telephone Encounter (Signed)
Patient of Dr. Gollan. Please review for refill. Thank you!  

## 2022-05-15 ENCOUNTER — Other Ambulatory Visit: Payer: Self-pay

## 2022-05-15 DIAGNOSIS — C50411 Malignant neoplasm of upper-outer quadrant of right female breast: Secondary | ICD-10-CM

## 2022-05-15 DIAGNOSIS — D509 Iron deficiency anemia, unspecified: Secondary | ICD-10-CM

## 2022-05-16 ENCOUNTER — Other Ambulatory Visit: Payer: Self-pay

## 2022-05-16 ENCOUNTER — Inpatient Hospital Stay (HOSPITAL_BASED_OUTPATIENT_CLINIC_OR_DEPARTMENT_OTHER): Payer: Medicare Other | Admitting: Oncology

## 2022-05-16 ENCOUNTER — Inpatient Hospital Stay: Payer: Medicare Other | Attending: Oncology

## 2022-05-16 ENCOUNTER — Encounter: Payer: Self-pay | Admitting: Oncology

## 2022-05-16 VITALS — BP 128/66 | HR 89 | Temp 96.6°F | Resp 19 | Wt 145.1 lb

## 2022-05-16 DIAGNOSIS — Z171 Estrogen receptor negative status [ER-]: Secondary | ICD-10-CM | POA: Diagnosis not present

## 2022-05-16 DIAGNOSIS — D509 Iron deficiency anemia, unspecified: Secondary | ICD-10-CM

## 2022-05-16 DIAGNOSIS — C50411 Malignant neoplasm of upper-outer quadrant of right female breast: Secondary | ICD-10-CM | POA: Diagnosis not present

## 2022-05-16 DIAGNOSIS — D72829 Elevated white blood cell count, unspecified: Secondary | ICD-10-CM

## 2022-05-16 DIAGNOSIS — Z79811 Long term (current) use of aromatase inhibitors: Secondary | ICD-10-CM | POA: Diagnosis not present

## 2022-05-16 DIAGNOSIS — Z87891 Personal history of nicotine dependence: Secondary | ICD-10-CM | POA: Insufficient documentation

## 2022-05-16 LAB — CBC WITH DIFFERENTIAL/PLATELET
Abs Immature Granulocytes: 0.09 10*3/uL — ABNORMAL HIGH (ref 0.00–0.07)
Basophils Absolute: 0.1 10*3/uL (ref 0.0–0.1)
Basophils Relative: 1 %
Eosinophils Absolute: 0.8 10*3/uL — ABNORMAL HIGH (ref 0.0–0.5)
Eosinophils Relative: 6 %
HCT: 39.1 % (ref 36.0–46.0)
Hemoglobin: 11.4 g/dL — ABNORMAL LOW (ref 12.0–15.0)
Immature Granulocytes: 1 %
Lymphocytes Relative: 18 %
Lymphs Abs: 2.2 10*3/uL (ref 0.7–4.0)
MCH: 22.7 pg — ABNORMAL LOW (ref 26.0–34.0)
MCHC: 29.2 g/dL — ABNORMAL LOW (ref 30.0–36.0)
MCV: 77.7 fL — ABNORMAL LOW (ref 80.0–100.0)
Monocytes Absolute: 1.5 10*3/uL — ABNORMAL HIGH (ref 0.1–1.0)
Monocytes Relative: 13 %
Neutro Abs: 7.6 10*3/uL (ref 1.7–7.7)
Neutrophils Relative %: 61 %
Platelets: 353 10*3/uL (ref 150–400)
RBC: 5.03 MIL/uL (ref 3.87–5.11)
RDW: 19.6 % — ABNORMAL HIGH (ref 11.5–15.5)
WBC: 12.3 10*3/uL — ABNORMAL HIGH (ref 4.0–10.5)
nRBC: 0 % (ref 0.0–0.2)

## 2022-05-16 LAB — FERRITIN: Ferritin: 21 ng/mL (ref 11–307)

## 2022-05-16 LAB — IRON AND TIBC
Iron: 24 ug/dL — ABNORMAL LOW (ref 28–170)
Saturation Ratios: 7 % — ABNORMAL LOW (ref 10.4–31.8)
TIBC: 344 ug/dL (ref 250–450)
UIBC: 320 ug/dL

## 2022-05-16 LAB — BASIC METABOLIC PANEL
Anion gap: 11 (ref 5–15)
BUN: 21 mg/dL (ref 8–23)
CO2: 26 mmol/L (ref 22–32)
Calcium: 8.7 mg/dL — ABNORMAL LOW (ref 8.9–10.3)
Chloride: 102 mmol/L (ref 98–111)
Creatinine, Ser: 1.21 mg/dL — ABNORMAL HIGH (ref 0.44–1.00)
GFR, Estimated: 45 mL/min — ABNORMAL LOW (ref >60)
Glucose, Bld: 101 mg/dL — ABNORMAL HIGH (ref 70–99)
Potassium: 4.6 mmol/L (ref 3.5–5.1)
Sodium: 139 mmol/L (ref 135–145)

## 2022-05-16 NOTE — Progress Notes (Signed)
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  INTERVAL HISTORY: Patient returns to clinic today for routine 6-monthevaluation.  She continues to feel well and remains asymptomatic.  She is tolerating letrozole without significant side effects.  She does not complain of any weakness or fatigue.  She has no neurologic complaints. She denies any recent fevers or illnesses.  She denies any chest pain, shortness of breath, cough, or hemoptysis.  She denies any nausea, vomiting, constipation, or diarrhea.  She denies any melena or hematochezia.  She has no urinary complaints.  Patient offers no specific complaints today.  REVIEW OF SYSTEMS:   Review of Systems  Constitutional: Negative.  Negative for fever and weight loss.  Respiratory: Negative.  Negative for cough and shortness of breath.   Cardiovascular: Negative.  Negative for chest pain and leg swelling.  Gastrointestinal: Negative.  Negative for abdominal pain, constipation, diarrhea and nausea.  Genitourinary: Negative.  Negative for dysuria and hematuria.  Musculoskeletal: Negative.  Negative for joint pain.  Skin: Negative.  Negative for rash.  Neurological: Negative.  Negative for sensory change, focal weakness, weakness and headaches.  Psychiatric/Behavioral: Negative.  The patient is not nervous/anxious.     As per HPI. Otherwise, a complete review of systems is negative.  PAST MEDICAL HISTORY: Past Medical History:  Diagnosis Date   Anxiety     Arthritis    CAD (coronary artery disease)    a. NSTEMI 12/19; b. LHC 04/10/18: pLAD 95%, mLAD 80%, m-dLCx 95%, OM3-1 lesion 40%, OM3-2 lesion 60%, mRCA 50%, EF 35-45%, successful PCI/DES x 2 to the LAD with recommended staged PCI of the LCx in a few weeks   Cancer (HCC)    Right Breast Cancer   Chronic systolic CHF (congestive heart failure) ([place]    a. TTE 12/19: EF 30-35%, anteroseptal, anterior, and apical HK, Gr1DD, mild to mod MR, mildly dilated LA, RVSF nl   COPD (chronic obstructive pulmonary disease) (HCC)    Depression    Dyspnea    with exertion   GERD (gastroesophageal reflux disease)    Hyperlipidemia    Hypertension    Personal history of chemotherapy 2018   chemo prior to lumpectomy of right breast   Personal history of radiation therapy 2019   right breast ca    PAST SURGICAL HISTORY: Past Surgical History:  Procedure Laterality Date   ABDOMINAL HYSTERECTOMY  1990   Partial   BREAST BIOPSY Right 10/04/2016   axilla lymph node (metastatic carcinoma) and axillay tail mass biopsy-invasive mammary carcinoma   [facility]21/2017   [facility]   BREAST LUMPECTOMY Right 05/21/2017   re-excision for clip   BREAST LUMPECTOMY WITH NEEDLE LOCALIZATION Right 03/21/2017   Procedure: BREAST LUMPECTOMY WITH NEEDLE LOCALIZATION;  Surgeon: WClayburn Pert MD;  Location: [facility];  Service: General;  Laterality: Right;   CORONARY STENT INTERVENTION N/A 04/10/2018   Procedure: CORONARY STENT INTERVENTION;  Surgeon: AWellington Hampshire MD;  [place] [facility];  Service: Cardiovascular;  Laterality: N/A;   CORONARY  STENT INTERVENTION N/A 05/14/2018   Procedure: CORONARY STENT INTERVENTION;  Surgeon: Nelva Bush, MD;  Location: [place] [facility];  Service: Cardiovascular;  Laterality: N/A;   DILATION AND CURETTAGE OF UTERUS     INGUINAL HERNIA REPAIR Right 03/26/2017   Procedure: HERNIA REPAIR INGUINAL INCARCERATED;   Surgeon: Jules Husbands, MD;  Location: [facility];  Service: General;  Laterality: Right;   LEFT HEART CATH AND CORONARY ANGIOGRAPHY N/A 04/10/2018   Procedure: LEFT HEART CATH AND CORONARY ANGIOGRAPHY poss PCI;  Surgeon: Minna Merritts, MD;  Location: [place] [facility];  Service: Cardiovascular;  Laterality: N/A;   PORTA CATH REMOVAL N/A 12/06/2020   Procedure: PORTA CATH REMOVAL;  Surgeon: Algernon Huxley, MD;  Location: [place] [facility];  Service: Cardiovascular;  Laterality: N/A;   PORTACATH PLACEMENT Left 10/24/2016   Procedure: INSERTION PORT-A-CATH;  Surgeon: [name], MD;  Location: [facility];  Service: General;  Laterality: Left;   RE-EXCISION OF BREAST LUMPECTOMY Right 05/21/2017   Procedure: RE-EXCISION OF BREAST LUMPECTOMY;  Surgeon: Clayburn Pert, MD;  Location: [facility];  Service: General;  Laterality: Right;   SENTINEL NODE BIOPSY Right 03/21/2017   Procedure: SENTINEL NODE BIOPSY;  Surgeon: Clayburn Pert, MD;  Location: [facility];  Service: General;  Laterality: Right;    FAMILY HISTORY: Family History  Problem Relation Age of Onset   Colon cancer Mother    Breast cancer Neg Hx     ADVANCED DIRECTIVES (Y/N):  N  HEALTH MAINTENANCE: Social History   Tobacco Use   Smoking status: Former    Packs/day: 0.50    Types: Cigarettes    Quit date: 07/23/1988    Years since quitting: 33.8   Smokeless tobacco: Never  Vaping Use   Vaping Use: Never used  Substance Use Topics   Alcohol use: Yes    Comment: rare   Drug use: No     Colonoscopy:  PAP:  Bone density:  Lipid panel:  Allergies  Allergen Reactions   Nsaids     Bleeding risk   Penicillins Anaphylaxis, Swelling and Rash    Has patient had a PCN reaction causing immediate rash, facial/tongue/throat swelling, SOB or lightheadedness with hypotension: Yes Has patient had a PCN reaction causing severe rash involving mucus membranes or skin necrosis: No Has patient had a PCN reaction that required  hospitalization: No  Has patient had a PCN reaction occurring within the last 10 years: No If all of the above answers are "NO", then may proceed with Cephalosporin use.    Tamsulosin Nausea Only   Atorvastatin Other (See Comments)    Muscle Pain   Gabapentin Swelling   Ezetimibe Other (See Comments)    Muscle pain   Aspirin     GI bleeding risk    Current Outpatient Medications  Medication Sig Dispense Refill   acetaminophen (ACETAMINOPHEN 8 HOUR) 650 MG CR tablet Take 650 mg by mouth every 8 (eight) hours as needed for pain.     albuterol (PROVENTIL HFA;VENTOLIN HFA) 108 (90 Base) MCG/ACT inhaler Inhale 2 puffs into the lungs every 6 (six) hours as needed for wheezing or shortness of breath.     carvedilol (COREG) 6.25 MG tablet Take 6.25 mg by mouth 2 (two) times daily.     cholecalciferol (VITAMIN D3) 25 MCG (1000 UT) tablet Take 1,000 Units by mouth daily.     clopidogrel (PLAVIX) 75 MG tablet Take 1 tablet (75 mg total) by mouth daily with breakfast. 90 tablet 0  Coenzyme Q10 300 MG CAPS Take 300 mg by mouth daily.      DULoxetine (CYMBALTA) 30 MG capsule Take 30 mg by mouth daily. (Take with '60mg'$  capsule to equal '90mg'$ )  11   DULoxetine (CYMBALTA) 60 MG capsule Take 60 mg by mouth daily. (Take with '30mg'$  capsule to equal '90mg'$ )     enalapril (VASOTEC) 5 MG tablet TAKE 1 TABLET (5 MG TOTAL) BY MOUTH DAILY. 90 tablet 1   feeding supplement (ENSURE ENLIVE / ENSURE PLUS) LIQD Take 237 mLs by mouth daily.     fexofenadine (ALLEGRA) 180 MG tablet Take 180 mg by mouth daily.     fluticasone (FLONASE) 50 MCG/ACT nasal spray Place 2 sprays into the nose 2 (two) times daily as needed for allergies or rhinitis.     fluticasone furoate-vilanterol (BREO ELLIPTA) 200-25 MCG/INH AEPB Inhale 1 puff into the lungs daily.     furosemide (LASIX) 20 MG tablet Take 1 tablet (20 mg total) by mouth daily as needed for edema. 30 tablet 11   HYDROcodone bit-homatropine (HYCODAN) 5-1.5 MG/5ML syrup Take 5  mLs by mouth every 6 (six) hours as needed.     isosorbide mononitrate (IMDUR) 30 MG 24 hr tablet Take 0.5 tablets (15 mg total) by mouth daily. 45 tablet 3   letrozole (FEMARA) 2.5 MG tablet TAKE 1 TABLET BY MOUTH EVERY DAY 90 tablet 0   lidocaine-prilocaine (EMLA) cream Apply 1 application  topically as needed (for port access).     Magnesium Oxide 250 MG TABS Take 250 mg by mouth daily.     melatonin 5 MG TABS Take 0.5 tablets (2.5 mg total) by mouth at bedtime as needed. 30 tablet 0   montelukast (SINGULAIR) 10 MG tablet Take 10 mg by mouth at bedtime.      pantoprazole (PROTONIX) 40 MG tablet TAKE 1 TABLET BY MOUTH EVERY DAY 90 tablet 3   predniSONE (DELTASONE) 5 MG tablet Take by mouth.     rosuvastatin (CRESTOR) 20 MG tablet TAKE 1 TABLET BY MOUTH EVERY DAY AT 6PM 90 tablet 3   spironolactone (ALDACTONE) 25 MG tablet TAKE 1 TABLET BY MOUTH EVERY DAY 90 tablet 3   tiotropium (SPIRIVA) 18 MCG inhalation capsule Place 18 mcg into inhaler and inhale daily.     No current facility-administered medications for this visit.   Facility-Administered Medications Ordered in Other Visits  Medication Dose Route Frequency Provider Last Rate Last Admin   heparin lock flush 100 unit/mL  500 Units Intravenous Once Lloyd Huger, MD       heparin lock flush 100 unit/mL  500 Units Intravenous Once Lloyd Huger, MD       ondansetron [facility]) 8 mg in sodium chloride 0.9 % 50 mL IVPB   Intravenous Once Lloyd Huger, MD        OBJECTIVE: Vitals:   05/16/22 1457  BP: 128/66  Pulse: 89  Resp: 19  Temp: (!) 96.6 F (35.9 C)  SpO2: 95%     Body mass index is 26.54 kg/m.    ECOG FS:0 - Asymptomatic  General: Well-developed, well-nourished, no acute distress. Eyes: Pink conjunctiva, anicteric sclera. HEENT: Normocephalic, moist mucous membranes. Lungs: No audible wheezing or coughing. Heart: Regular rate and rhythm. Abdomen: Soft, nontender, no obvious  distention. Musculoskeletal: No edema, cyanosis, or clubbing. Neuro: Alert, answering all questions appropriately. Cranial nerves grossly intact. Skin: No rashes or petechiae noted. Psych: Normal affect.  LAB RESULTS:  Lab Results  Component Value Date  NA 139 05/16/2022   K 4.6 05/16/2022   CL 102 05/16/2022   CO2 26 05/16/2022   GLUCOSE 101 (H) 05/16/2022   BUN 21 05/16/2022   CREATININE 1.21 (H) 05/16/2022   CALCIUM 8.7 (L) 05/16/2022   PROT 7.6 09/03/2021   ALBUMIN 3.4 (L) 09/03/2021   AST 18 09/03/2021   ALT 9 09/03/2021   ALKPHOS 51 09/03/2021   BILITOT 0.4 09/03/2021   GFRNONAA 45 (L) 05/16/2022   GFRAA 39 (L) 12/01/2019    Lab Results  Component Value Date   WBC 12.3 (H) 05/16/2022   NEUTROABS 7.6 05/16/2022   HGB 11.4 (L) 05/16/2022   HCT 39.1 05/16/2022   MCV 77.7 (L) 05/16/2022   PLT 353 05/16/2022   Lab Results  Component Value Date   IRON 41 05/12/2021   TIBC 412 05/12/2021   IRONPCTSAT 10 (L) 05/12/2021   Lab Results  Component Value Date   FERRITIN 15 05/12/2021     STUDIES: No results found.  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  PLAN:    1. 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  Treatment was discontinued secondary to declining performance status.  Patient underwent lumpectomy on March 21, 2017, but no biopsy clip was seen in her surgical specimen.  She had repeat surgery on May 21, 2017 to remove her retained clip, which confirmed a complete pathological response. Patient completed her adjuvant XRT on September 27, 2017.  Continue letrozole for a total of 5 years completing treatment in approximately April 2024.  Patient recently filled a 90-day supply of her medication and was instructed to complete  this prescription and then discontinue.  She will require repeat mammogram in March 2024.  Return to clinic in 3 months for further evaluation at which point she can be transition to yearly visits or possibly discharged to primary care.   2.  Iron deficiency anemia: Essentially resolved.  Patient's most recent hemoglobin is decreased, but stable at 11.4.  Iron stores are pending at time of dictation. Patient last received IV Venofer on May 04, 2020.    3.  Bone health:  Bone mineral density on January 01, 2020 reported T score of -0.8 which is considered normal.  Continue calcium and vitamin D supplementation.  Repeat in March 2024 along with mammogram as above. 4.  Cardiac disease: Patient now has 3 stents placed.  Continue follow-up and treatment per cardiology. 5.  Renal insufficiency: Patient's creatinine is mildly improved to 1.21. 6.  Leukocytosis: Possibly reactive, monitor.  Will get peripheral blood flow cytometry at next clinic visit.   Patient expressed understanding and was in agreement with this plan. She also understands that She can call clinic at any time with any questions, concerns, or complaints.    Cancer Staging  Malignant neoplasm of right female breast [facility]) Staging form: Breast, AJCC 8th Edition - Clinical stage from 10/16/2016: Stage IIB (cT2, cN1, cM0, G3, ER-, PR+, HER2+) - Signed by Lloyd Huger, MD on 10/16/2016 Histologic grading system: 3 grade system Laterality: Right   Lloyd Huger, MD   05/16/2022 3:53 PM

## 2022-06-21 ENCOUNTER — Other Ambulatory Visit: Payer: Self-pay | Admitting: Gastroenterology

## 2022-06-21 DIAGNOSIS — R053 Chronic cough: Secondary | ICD-10-CM

## 2022-06-27 ENCOUNTER — Ambulatory Visit
Admission: RE | Admit: 2022-06-27 | Discharge: 2022-06-27 | Disposition: A | Discharge status: Home / Self Care | Payer: Medicare Other | Source: Ambulatory Visit | Attending: Gastroenterology | Admitting: Gastroenterology

## 2022-06-27 DIAGNOSIS — R053 Chronic cough: Secondary | ICD-10-CM

## 2022-07-06 ENCOUNTER — Ambulatory Visit
Admission: RE | Admit: 2022-07-06 | Discharge: 2022-07-06 | Disposition: A | Discharge status: Home / Self Care | Payer: Medicare Other | Source: Ambulatory Visit | Attending: Medical Oncology | Admitting: Medical Oncology

## 2022-07-06 ENCOUNTER — Other Ambulatory Visit: Payer: Self-pay | Admitting: Medical Oncology

## 2022-07-06 DIAGNOSIS — C50411 Malignant neoplasm of upper-outer quadrant of right female breast: Secondary | ICD-10-CM | POA: Diagnosis present

## 2022-07-06 DIAGNOSIS — M85851 Other specified disorders of bone density and structure, right thigh: Secondary | ICD-10-CM | POA: Diagnosis not present

## 2022-07-06 DIAGNOSIS — Z171 Estrogen receptor negative status [ER-]: Secondary | ICD-10-CM | POA: Diagnosis present

## 2022-07-06 DIAGNOSIS — N6310 Unspecified lump in the right breast, unspecified quadrant: Secondary | ICD-10-CM

## 2022-07-06 DIAGNOSIS — Z780 Asymptomatic menopausal state: Secondary | ICD-10-CM | POA: Diagnosis not present

## 2022-07-07 ENCOUNTER — Other Ambulatory Visit: Payer: Self-pay | Admitting: Medical Oncology

## 2022-07-07 DIAGNOSIS — N63 Unspecified lump in unspecified breast: Secondary | ICD-10-CM

## 2022-07-07 DIAGNOSIS — R928 Other abnormal and inconclusive findings on diagnostic imaging of breast: Secondary | ICD-10-CM

## 2022-07-11 ENCOUNTER — Ambulatory Visit
Admission: RE | Admit: 2022-07-11 | Discharge: 2022-07-11 | Disposition: A | Discharge status: Home / Self Care | Payer: Medicare Other | Source: Ambulatory Visit | Attending: Medical Oncology | Admitting: Medical Oncology

## 2022-07-11 DIAGNOSIS — R928 Other abnormal and inconclusive findings on diagnostic imaging of breast: Secondary | ICD-10-CM

## 2022-07-11 DIAGNOSIS — N63 Unspecified lump in unspecified breast: Secondary | ICD-10-CM | POA: Insufficient documentation

## 2022-07-11 HISTORY — PX: BREAST BIOPSY: SHX20

## 2022-07-11 MED ORDER — LIDOCAINE-EPINEPHRINE 1 %-1:100000 IJ SOLN
8.0000 mL | Freq: Once | INTRAMUSCULAR | Status: AC
  Administered 2022-07-11: 8 mL via INTRADERMAL

## 2022-07-11 MED ORDER — LIDOCAINE HCL (PF) 1 % IJ SOLN
2.0000 mL | Freq: Once | INTRAMUSCULAR | Status: AC
  Administered 2022-07-11: 2 mL via INTRADERMAL

## 2022-07-12 LAB — SURGICAL PATHOLOGY

## 2022-07-18 ENCOUNTER — Other Ambulatory Visit: Payer: Self-pay | Admitting: Cardiovascular Disease

## 2022-08-04 ENCOUNTER — Other Ambulatory Visit: Payer: Self-pay | Admitting: Oncology

## 2022-08-15 ENCOUNTER — Inpatient Hospital Stay: Payer: Medicare Other

## 2022-08-15 ENCOUNTER — Encounter: Payer: Self-pay | Admitting: Oncology

## 2022-08-15 ENCOUNTER — Inpatient Hospital Stay: Payer: Medicare Other | Attending: Oncology | Admitting: Oncology

## 2022-08-15 VITALS — BP 140/72 | HR 72 | Temp 96.0°F | Resp 17 | Wt 145.0 lb

## 2022-08-15 DIAGNOSIS — C50411 Malignant neoplasm of upper-outer quadrant of right female breast: Secondary | ICD-10-CM | POA: Diagnosis not present

## 2022-08-15 DIAGNOSIS — D72829 Elevated white blood cell count, unspecified: Secondary | ICD-10-CM | POA: Diagnosis not present

## 2022-08-15 DIAGNOSIS — N289 Disorder of kidney and ureter, unspecified: Secondary | ICD-10-CM | POA: Diagnosis not present

## 2022-08-15 DIAGNOSIS — Z853 Personal history of malignant neoplasm of breast: Secondary | ICD-10-CM | POA: Diagnosis present

## 2022-08-15 DIAGNOSIS — M8580 Other specified disorders of bone density and structure, unspecified site: Secondary | ICD-10-CM | POA: Diagnosis not present

## 2022-08-15 DIAGNOSIS — D509 Iron deficiency anemia, unspecified: Secondary | ICD-10-CM | POA: Diagnosis not present

## 2022-08-15 DIAGNOSIS — Z171 Estrogen receptor negative status [ER-]: Secondary | ICD-10-CM | POA: Diagnosis not present

## 2022-08-15 DIAGNOSIS — Z08 Encounter for follow-up examination after completed treatment for malignant neoplasm: Secondary | ICD-10-CM | POA: Insufficient documentation

## 2022-08-15 LAB — COMPREHENSIVE METABOLIC PANEL
ALT: 8 U/L (ref 0–44)
AST: 19 U/L (ref 15–41)
Albumin: 3.8 g/dL (ref 3.5–5.0)
Alkaline Phosphatase: 48 U/L (ref 38–126)
Anion gap: 7 (ref 5–15)
BUN: 33 mg/dL — ABNORMAL HIGH (ref 8–23)
CO2: 27 mmol/L (ref 22–32)
Calcium: 9.1 mg/dL (ref 8.9–10.3)
Chloride: 106 mmol/L (ref 98–111)
Creatinine, Ser: 1.32 mg/dL — ABNORMAL HIGH (ref 0.44–1.00)
GFR, Estimated: 41 mL/min — ABNORMAL LOW (ref >60)
Glucose, Bld: 100 mg/dL — ABNORMAL HIGH (ref 70–99)
Potassium: 5.2 mmol/L — ABNORMAL HIGH (ref 3.5–5.1)
Sodium: 140 mmol/L (ref 135–145)
Total Bilirubin: 0.4 mg/dL (ref 0.3–1.2)
Total Protein: 7.6 g/dL (ref 6.5–8.1)

## 2022-08-15 LAB — IRON AND TIBC
Iron: 34 ug/dL (ref 28–170)
Saturation Ratios: 8 % — ABNORMAL LOW (ref 10.4–31.8)
TIBC: 420 ug/dL (ref 250–450)
UIBC: 386 ug/dL

## 2022-08-15 LAB — CBC WITH DIFFERENTIAL/PLATELET
Abs Immature Granulocytes: 0.03 10*3/uL (ref 0.00–0.07)
Basophils Absolute: 0.1 10*3/uL (ref 0.0–0.1)
Basophils Relative: 1 %
Eosinophils Absolute: 0.6 10*3/uL — ABNORMAL HIGH (ref 0.0–0.5)
Eosinophils Relative: 5 %
HCT: 42.9 % (ref 36.0–46.0)
Hemoglobin: 12.4 g/dL (ref 12.0–15.0)
Immature Granulocytes: 0 %
Lymphocytes Relative: 21 %
Lymphs Abs: 2.2 10*3/uL (ref 0.7–4.0)
MCH: 24.2 pg — ABNORMAL LOW (ref 26.0–34.0)
MCHC: 28.9 g/dL — ABNORMAL LOW (ref 30.0–36.0)
MCV: 83.6 fL (ref 80.0–100.0)
Monocytes Absolute: 1 10*3/uL (ref 0.1–1.0)
Monocytes Relative: 10 %
Neutro Abs: 6.7 10*3/uL (ref 1.7–7.7)
Neutrophils Relative %: 63 %
Platelets: 346 10*3/uL (ref 150–400)
RBC: 5.13 MIL/uL — ABNORMAL HIGH (ref 3.87–5.11)
RDW: 22.5 % — ABNORMAL HIGH (ref 11.5–15.5)
WBC: 10.6 10*3/uL — ABNORMAL HIGH (ref 4.0–10.5)
nRBC: 0 % (ref 0.0–0.2)

## 2022-08-15 LAB — FERRITIN: Ferritin: 19 ng/mL (ref 11–307)

## 2022-08-15 NOTE — Progress Notes (Signed)
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  INTERVAL HISTORY: Patient returns to clinic today for routine evaluation.  She continues to feel well and remains asymptomatic.  She is tolerating letrozole without significant side effects.  She denies weakness or fatigue.  She has no neurologic complaints. She denies any recent fevers or illnesses.  She denies any chest pain, shortness of breath, cough, or hemoptysis.  She denies any nausea, vomiting, constipation, or diarrhea.  She denies any melena or hematochezia.  She has no urinary complaints.  Patient offers no specific complaints today.  REVIEW OF SYSTEMS:   Review of Systems  Constitutional: Negative.  Negative for fever and weight loss.  Respiratory: Negative.  Negative for cough and shortness of breath.   Cardiovascular: Negative.  Negative for chest pain and leg swelling.  Gastrointestinal: Negative.  Negative for abdominal pain, constipation, diarrhea and nausea.  Genitourinary: Negative.  Negative for dysuria and hematuria.  Musculoskeletal: Negative.  Negative for joint pain.  Skin: Negative.  Negative for rash.  Neurological: Negative.  Negative for sensory change, focal weakness, weakness and headaches.  Psychiatric/Behavioral: Negative.  The patient is not nervous/anxious.     As per HPI. Otherwise, a complete review of systems is negative.  PAST MEDICAL HISTORY: Past Medical History:  Diagnosis Date   Anxiety    Arthritis    CAD  (coronary artery disease)    a. NSTEMI 12/19; b. LHC 04/10/18: pLAD 95%, mLAD 80%, m-dLCx 95%, OM3-1 lesion 40%, OM3-2 lesion 60%, mRCA 50%, EF 35-45%, successful PCI/DES x 2 to the LAD with recommended staged PCI of the LCx in a few weeks   Cancer    Right Breast Cancer   Chronic systolic CHF (congestive heart failure)    a. TTE 12/19: EF 30-35%, anteroseptal, anterior, and apical HK, Gr1DD, mild to mod MR, mildly dilated LA, RVSF nl   COPD (chronic obstructive pulmonary disease)    Depression    Dyspnea    with exertion   GERD (gastroesophageal reflux disease)    Hyperlipidemia    Hypertension    Personal history of chemotherapy 2018   chemo prior to lumpectomy of right breast   Personal history of radiation therapy 2019   right breast ca    PAST SURGICAL HISTORY: Past Surgical History:  Procedure Laterality Date   ABDOMINAL HYSTERECTOMY  1990   Partial   BREAST BIOPSY Right 10/04/2016   axilla lymph node (metastatic carcinoma) and axillay tail mass biopsy-invasive mammary carcinoma   BREAST BIOPSY Right 07/11/2022   [place] Core Bx coil clip- path pending   BREAST BIOPSY Right 07/11/2022   [place] RT BREAST BX W LOC DEV 1ST LESION IMG BX SPEC US GUIDE 07/11/2022 [facility]   BREAST LUMPECTOMY Right 03/21/2017   chemo first, [facility] and metastatic LN   BREAST LUMPECTOMY Right 05/21/2017   re-excision for clip   BREAST LUMPECTOMY WITH NEEDLE LOCALIZATION Right 03/21/2017   Procedure: BREAST LUMPECTOMY WITH NEEDLE LOCALIZATION;  Surgeon: Ricarda Frame, MD;  Location: [facility];  Service: General;  Laterality: Right;   CORONARY  STENT INTERVENTION N/A 04/10/2018   Procedure: CORONARY STENT INTERVENTION;  Surgeon: Iran Ouch, MD;  Location: [facility];  Service: Cardiovascular;  Laterality: N/A;   CORONARY STENT INTERVENTION N/A 05/14/2018   Procedure: CORONARY STENT INTERVENTION;  Surgeon: Yvonne Kendall, MD;  Location: [facility];  Service: Cardiovascular;   Laterality: N/A;   DILATION AND CURETTAGE OF UTERUS     INGUINAL HERNIA REPAIR Right 03/26/2017   Procedure: HERNIA REPAIR INGUINAL INCARCERATED;  Surgeon: Leafy Ro, MD;  Location: [facility];  Service: General;  Laterality: Right;   LEFT HEART CATH AND CORONARY ANGIOGRAPHY N/A 04/10/2018   Procedure: LEFT HEART CATH AND CORONARY ANGIOGRAPHY poss PCI;  Surgeon: Antonieta Iba, MD;  Location: [facility];  Service: Cardiovascular;  Laterality: N/A;   PORTA CATH REMOVAL N/A 12/06/2020   Procedure: PORTA CATH REMOVAL;  Surgeon: Annice Needy, MD;  Location: [facility];  Service: Cardiovascular;  Laterality: N/A;   PORTACATH PLACEMENT Left 10/24/2016   Procedure: INSERTION PORT-A-CATH;  Surgeon: Hulda Marin, MD;  Location: [facility];  Service: General;  Laterality: Left;   RE-EXCISION OF BREAST LUMPECTOMY Right 05/21/2017   Procedure: RE-EXCISION OF BREAST LUMPECTOMY;  Surgeon: Ricarda Frame, MD;  Location: [facility];  Service: General;  Laterality: Right;   SENTINEL NODE BIOPSY Right 03/21/2017   Procedure: SENTINEL NODE BIOPSY;  Surgeon: Ricarda Frame, MD;  Location: [facility];  Service: General;  Laterality: Right;    FAMILY HISTORY: Family History  Problem Relation Age of Onset   Colon cancer Mother    Breast cancer Neg Hx     ADVANCED DIRECTIVES (Y/N):  N  HEALTH MAINTENANCE: Social History   Tobacco Use   Smoking status: Former    Packs/day: .5    Types: Cigarettes    Quit date: 07/23/1988    Years since quitting: 34.0   Smokeless tobacco: Never  Vaping Use   Vaping Use: Never used  Substance Use Topics   Alcohol use: Yes    Comment: rare   Drug use: No     Colonoscopy:  PAP:  Bone density:  Lipid panel:  Allergies  Allergen Reactions   Nsaids     Bleeding risk   Penicillins Anaphylaxis, Swelling and Rash    Has patient had a PCN reaction causing immediate rash, facial/tongue/throat swelling, SOB or lightheadedness with hypotension:  Yes Has patient had a PCN reaction causing severe rash involving mucus membranes or skin necrosis: No Has patient had a PCN reaction that required hospitalization: No  Has patient had a PCN reaction occurring within the last 10 years: No If all of the above answers are "NO", then may proceed with Cephalosporin use.    Tamsulosin Nausea Only   Atorvastatin Other (See Comments)    Muscle Pain   Gabapentin Swelling   Ezetimibe Other (See Comments)    Muscle pain   Aspirin     GI bleeding risk    Current Outpatient Medications  Medication Sig Dispense Refill   acetaminophen (ACETAMINOPHEN 8 HOUR) 650 MG CR tablet Take 650 mg by mouth every 8 (eight) hours as needed for pain.     albuterol (PROVENTIL HFA;VENTOLIN HFA) 108 (90 Base) MCG/ACT inhaler Inhale 2 puffs into the lungs every 6 (six) hours as needed for wheezing or shortness of breath.     baclofen (LIORESAL) 10 MG tablet TAKE 1 TABLET BY MOUTH EVERY DAY AT NIGHT     carvedilol (COREG) 6.25 MG tablet Take  6.25 mg by mouth 2 (two) times daily.     cholecalciferol (VITAMIN D3) 25 MCG (1000 UT) tablet Take 1,000 Units by mouth daily.     clopidogrel (PLAVIX) 75 MG tablet Take 1 tablet (75 mg total) by mouth daily with breakfast. 90 tablet 0   Coenzyme Q10 300 MG CAPS Take 300 mg by mouth daily.      doxycycline (VIBRAMYCIN) 100 MG capsule Take 100 mg by mouth 2 (two) times daily.     DULoxetine (CYMBALTA) 30 MG capsule Take 30 mg by mouth daily. (Take with 60mg  capsule to equal 90mg )  11   DULoxetine (CYMBALTA) 60 MG capsule Take 60 mg by mouth daily. (Take with 30mg  capsule to equal 90mg )     enalapril (VASOTEC) 5 MG tablet TAKE 1 TABLET (5 MG TOTAL) BY MOUTH DAILY. 90 tablet 1   feeding supplement (ENSURE ENLIVE / ENSURE PLUS) LIQD Take 237 mLs by mouth daily.     fexofenadine (ALLEGRA) 180 MG tablet Take 180 mg by mouth daily.     fluticasone (FLONASE) 50 MCG/ACT nasal spray Place 2 sprays into the nose 2 (two) times daily as needed  for allergies or rhinitis.     fluticasone furoate-vilanterol (BREO ELLIPTA) 200-25 MCG/INH AEPB Inhale 1 puff into the lungs daily.     furosemide (LASIX) 20 MG tablet Take 1 tablet (20 mg total) by mouth daily as needed for edema. 30 tablet 11   HYDROcodone bit-homatropine (HYCODAN) 5-1.5 MG/5ML syrup Take 5 mLs by mouth every 6 (six) hours as needed.     isosorbide mononitrate (IMDUR) 30 MG 24 hr tablet Take 0.5 tablets (15 mg total) by mouth daily. 45 tablet 3   letrozole (FEMARA) 2.5 MG tablet TAKE 1 TABLET BY MOUTH EVERY DAY 90 tablet 0   lidocaine-prilocaine (EMLA) cream Apply 1 application  topically as needed (for port access).     Magnesium Oxide 250 MG TABS Take 250 mg by mouth daily.     melatonin 5 MG TABS Take 0.5 tablets (2.5 mg total) by mouth at bedtime as needed. 30 tablet 0   montelukast (SINGULAIR) 10 MG tablet Take 10 mg by mouth at bedtime.      pantoprazole (PROTONIX) 40 MG tablet TAKE 1 TABLET BY MOUTH EVERY DAY 90 tablet 3   predniSONE (DELTASONE) 5 MG tablet Take by mouth.     rosuvastatin (CRESTOR) 20 MG tablet TAKE 1 TABLET BY MOUTH EVERY DAY AT 6PM 90 tablet 3   spironolactone (ALDACTONE) 25 MG tablet TAKE 1 TABLET BY MOUTH EVERY DAY 90 tablet 3   tiotropium (SPIRIVA) 18 MCG inhalation capsule Place 18 mcg into inhaler and inhale daily.     No current facility-administered medications for this visit.   Facility-Administered Medications Ordered in Other Visits  Medication Dose Route Frequency Provider Last Rate Last Admin   heparin lock flush 100 unit/mL  500 Units Intravenous Once Jeralyn Ruths, MD       heparin lock flush 100 unit/mL  500 Units Intravenous Once Jeralyn Ruths, MD       ondansetron [facility]) 8 mg in sodium chloride 0.9 % 50 mL IVPB   Intravenous Once Jeralyn Ruths, MD        OBJECTIVE: Vitals:   08/15/22 1454 08/15/22 1456  BP: (!) 142/74 (!) 140/72  Pulse: 72   Resp: 17   Temp: (!) 96 F (35.6 C)   SpO2: 97%      Body  mass index is 26.52  kg/m.    ECOG FS:0 - Asymptomatic  General: Well-developed, well-nourished, no acute distress. Eyes: Pink conjunctiva, anicteric sclera. HEENT: Normocephalic, moist mucous membranes. Breast: Exam deferred today. Lungs: No audible wheezing or coughing. Heart: Regular rate and rhythm. Abdomen: Soft, nontender, no obvious distention. Musculoskeletal: No edema, cyanosis, or clubbing. Neuro: Alert, answering all questions appropriately. Cranial nerves grossly intact. Skin: No rashes or petechiae noted. Psych: Normal affect.  LAB RESULTS:  Lab Results  Component Value Date   NA 140 08/15/2022   K 5.2 (H) 08/15/2022   CL 106 08/15/2022   CO2 27 08/15/2022   GLUCOSE 100 (H) 08/15/2022   BUN 33 (H) 08/15/2022   CREATININE 1.32 (H) 08/15/2022   CALCIUM 9.1 08/15/2022   PROT 7.6 08/15/2022   ALBUMIN 3.8 08/15/2022   AST 19 08/15/2022   ALT 8 08/15/2022   ALKPHOS 48 08/15/2022   BILITOT 0.4 08/15/2022   GFRNONAA 41 (L) 08/15/2022   GFRAA 39 (L) 12/01/2019    Lab Results  Component Value Date   WBC 10.6 (H) 08/15/2022   NEUTROABS 6.7 08/15/2022   HGB 12.4 08/15/2022   HCT 42.9 08/15/2022   MCV 83.6 08/15/2022   PLT 346 08/15/2022   Lab Results  Component Value Date   IRON 34 08/15/2022   TIBC 420 08/15/2022   IRONPCTSAT 8 (L) 08/15/2022   Lab Results  Component Value Date   FERRITIN 19 08/15/2022     STUDIES: No results found.  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  Treatment was discontinued secondary to declining performance status.  Patient underwent lumpectomy on March 21, 2017, but no biopsy clip was seen in her surgical specimen.  She had repeat  surgery on May 21, 2017 to remove her retained clip, which confirmed a complete pathological response. Patient completed her adjuvant XRT on September 27, 2017.  Continue letrozole for a total of 5 years completing treatment in approximately April 2024.  Patient has been instructed to complete her current prescription and then discontinue.  After lengthy discussion with the patient, it was agreed upon that primary care can continue ordering her screening mammograms and no further follow-up is necessary.   Iron deficiency anemia: Essentially resolved.  Patient's most recent hemoglobin is within normal limits at 12.4.  She has a mildly decreased iron saturation ratio, but otherwise iron panel is within normal limits.  She does not require additional IV iron at this time.  Continue oral iron supplementation.    Osteopenia: Patient's most recent bone mineral density on July 06, 2022 reported T-score of -1.2, which is slightly worse than prior.  Continue calcium and vitamin D supplementation.  Further bone mineral densities as per primary care.    Cardiac disease: Patient now has 3 stents placed.  Continue follow-up and treatment per cardiology. Renal insufficiency: Chronic and unchanged.  Patient's most recent creatinine is 1.32. Leukocytosis: Chronic and unchanged.  Patient's total white blood cell count has ranged from 10.0-24.2 since approximately June 2021.  Today's result is 10.6.  Peripheral blood flow cytometry was ordered for completeness and is pending at time of dictation.  No intervention is needed.  Patient does not require bone biopsy.  Patient expressed understanding and was in agreement  with this plan. She also understands that She can call clinic at any time with any questions, concerns, or complaints.    Cancer Staging  Malignant neoplasm of right female breast Staging form: Breast, AJCC 8th Edition - Clinical stage from 10/16/2016: Stage IIB (cT2, cN1, cM0, G3, ER-, PR+, HER2+) - Signed by  Jeralyn Ruths, MD on 10/16/2016 Histologic grading system: 3 grade system Laterality: Right   Jeralyn Ruths, MD   08/15/2022 3:53 PM

## 2022-08-15 NOTE — Progress Notes (Signed)
No new concerns today 

## 2022-08-17 LAB — COMP PANEL: LEUKEMIA/LYMPHOMA

## 2022-09-15 ENCOUNTER — Other Ambulatory Visit: Payer: Self-pay | Admitting: Specialist

## 2022-09-15 DIAGNOSIS — R911 Solitary pulmonary nodule: Secondary | ICD-10-CM

## 2022-10-04 ENCOUNTER — Ambulatory Visit: Payer: Medicare Other

## 2022-10-11 ENCOUNTER — Ambulatory Visit
Admission: RE | Admit: 2022-10-11 | Discharge: 2022-10-11 | Disposition: A | Discharge status: Home / Self Care | Payer: Medicare Other | Source: Ambulatory Visit | Attending: Specialist | Admitting: Specialist

## 2022-10-11 DIAGNOSIS — R911 Solitary pulmonary nodule: Secondary | ICD-10-CM | POA: Diagnosis present

## 2022-10-29 ENCOUNTER — Other Ambulatory Visit: Payer: Self-pay | Admitting: Oncology

## 2022-10-29 ENCOUNTER — Other Ambulatory Visit: Payer: Self-pay | Admitting: Cardiovascular Disease

## 2022-10-30 ENCOUNTER — Encounter: Payer: Self-pay | Admitting: Oncology

## 2022-11-05 ENCOUNTER — Other Ambulatory Visit: Payer: Self-pay | Admitting: Cardiovascular Disease

## 2022-11-05 DIAGNOSIS — I2510 Atherosclerotic heart disease of native coronary artery without angina pectoris: Secondary | ICD-10-CM

## 2023-01-21 ENCOUNTER — Other Ambulatory Visit: Payer: Self-pay | Admitting: Cardiovascular Disease

## 2023-01-29 ENCOUNTER — Other Ambulatory Visit: Payer: Self-pay | Admitting: Cardiovascular Disease

## 2023-01-29 DIAGNOSIS — I2510 Atherosclerotic heart disease of native coronary artery without angina pectoris: Secondary | ICD-10-CM

## 2023-01-29 NOTE — Progress Notes (Unsigned)
Cardiology Office Note  Date:  01/30/2023   ID:  Krista Cisneros, DOB December 12, 1940, MRN [id number]  PCP:  Krista Humphrey, MD   Chief Complaint  Patient presents with   Follow-up    Intermittent chest pain   Chest Pain    The last couple of months    HPI: Krista Cisneros is a 82 y.o. 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 Meredeth Ide Previously treated with courses of prednisone  Chronic renal sufficiency creatinine 1.5 up to 1.6  Echo  normal LV function, EF greater than 55%, normal RV function, no indication of valvular heart disease or elevated right heart pressures  EKG personally reviewed by myself on todays visit EKG Interpretation Date/Time:  Tuesday January 30 2023 10:22:43 EDT Ventricular Rate:  75 PR Interval:  168 QRS Duration:  68 QT Interval:  388 QTC Calculation: 433 R Axis:   4  Text Interpretation: Normal sinus rhythm Low voltage QRS Possible Inferior infarct , age undetermined When compared with ECG of 02-Sep-2021 15:02, No significant change was found Confirmed by Julien Nordmann [phone]) on 01/30/2023 10:38:08  AM    Head MRI reviewed from the ER  1. No acute intracranial abnormality. 2. Multiple old cerebellar infarcts and findings of chronic small vessel disease.  Past medical history reviewed COVID December 2021 Diagnosis of bronchopneumonia June 20, 2020 on Levaquin  Last echocardiogram June 2021, ejection fraction 50 to 55%  cath 04/10/18 Multivessel disease Severe mid LAD disease 95%, mid to distal stenosis as well 80% Proximal OM 3 disease large vessel 95% Moderate RCA disease 50% mid vessel  Successful angioplasty and drug-eluting stent placement to the LAD x2.  1. 90% mid LCx stenosis, unchanged from catheterization last month. 2. Widely patent proximal and mid LAD stents. 3. Successful PCI to mid LCx using Resolute Onyx 2.25 x 15 mm drug-eluting stent (postdilated to 2.6 mm) with 0% residual stenosis and TIMI-3 flow.   On 05/14/2018 NSTEMI along with chest pain and in worsening fatigue.   cardiac catheterization results detailed below, discussed with her 2 stents to the LAD, staged procedure with follow-up stent to the left circumflex  Ejection fraction of 30 to 35% by echocardiogram April 10, 2018   PMH:   has a past medical history of Anxiety, Arthritis, CAD (coronary artery disease), Cancer (HCC), Chronic systolic CHF (congestive heart failure) (HCC), COPD (chronic obstructive pulmonary disease) (HCC), Depression, Dyspnea, GERD (gastroesophageal reflux disease), Hyperlipidemia, Hypertension, Personal history of chemotherapy (2018), and Personal history of radiation therapy (2019).  PSH:    Past Surgical History:  Procedure Laterality Date   ABDOMINAL HYSTERECTOMY  1990  Partial   BREAST BIOPSY Right 10/04/2016   axilla lymph node (metastatic carcinoma) and axillay tail mass biopsy-invasive mammary carcinoma   BREAST BIOPSY Right 07/11/2022   [place] Core Bx coil clip- path pending   BREAST BIOPSY Right 07/11/2022   [place] RT BREAST BX W LOC DEV 1ST LESION IMG BX  SPEC US GUIDE 07/11/2022 [facility]   BREAST LUMPECTOMY Right 03/21/2017   chemo first, [facility] and metastatic LN   BREAST LUMPECTOMY Right 05/21/2017   re-excision for clip   BREAST LUMPECTOMY WITH NEEDLE LOCALIZATION Right 03/21/2017   Procedure: BREAST LUMPECTOMY WITH NEEDLE LOCALIZATION;  Surgeon: Ricarda Frame, MD;  Location: [facility];  Service: General;  Laterality: Right;   CORONARY STENT INTERVENTION N/A 04/10/2018   Procedure: CORONARY STENT INTERVENTION;  Surgeon: Iran Ouch, MD;  Location: [facility];  Service: Cardiovascular;  Laterality: N/A;   CORONARY STENT INTERVENTION N/A 05/14/2018   Procedure: CORONARY STENT INTERVENTION;  Surgeon: Yvonne Kendall, MD;  Location: [facility];  Service: Cardiovascular;  Laterality: N/A;   DILATION AND CURETTAGE OF UTERUS     INGUINAL HERNIA REPAIR Right 03/26/2017   Procedure: HERNIA REPAIR INGUINAL INCARCERATED;  Surgeon: Leafy Ro, MD;  Location: [facility];  Service: General;  Laterality: Right;   LEFT HEART CATH AND CORONARY ANGIOGRAPHY N/A 04/10/2018   Procedure: LEFT HEART CATH AND CORONARY ANGIOGRAPHY poss PCI;  Surgeon: Antonieta Iba, MD;  Location: [facility];  Service: Cardiovascular;  Laterality: N/A;   PORTA CATH REMOVAL N/A 12/06/2020   Procedure: PORTA CATH REMOVAL;  Surgeon: Annice Needy, MD;  Location: [facility];  Service: Cardiovascular;  Laterality: N/A;   PORTACATH PLACEMENT Left 10/24/2016   Procedure: INSERTION PORT-A-CATH;  Surgeon: Hulda Marin, MD;  Location: [facility];  Service: General;  Laterality: Left;   RE-EXCISION OF BREAST LUMPECTOMY Right 05/21/2017   Procedure: RE-EXCISION OF BREAST LUMPECTOMY;  Surgeon: Ricarda Frame, MD;  Location: [facility];  Service: General;  Laterality: Right;   SENTINEL NODE BIOPSY Right 03/21/2017   Procedure: SENTINEL NODE BIOPSY;  Surgeon: Ricarda Frame, MD;  Location: [facility];  Service: General;  Laterality: Right;     Current Outpatient Medications  Medication Sig Dispense Refill   acetaminophen (ACETAMINOPHEN 8 HOUR) 650 MG CR tablet Take 650 mg by mouth every 8 (eight) hours as needed for pain.     albuterol (PROVENTIL HFA;VENTOLIN HFA) 108 (90 Base) MCG/ACT inhaler Inhale 2 puffs into the lungs every 6 (six) hours as needed for wheezing or shortness of breath.     baclofen (LIORESAL) 10 MG tablet TAKE 1 TABLET BY MOUTH EVERY DAY AT NIGHT     carvedilol (COREG) 6.25 MG tablet Take 6.25 mg by mouth 2 (two) times daily.     cholecalciferol (VITAMIN D3) 25 MCG (1000 UT) tablet Take 1,000 Units by mouth daily.     clopidogrel (PLAVIX) 75 MG tablet Take 1 tablet (75 mg total) by mouth daily with breakfast. PLEASE CALL OFFICE TO SCHEDULE APPOINTMENT PRIOR TO NEXT REFILL 90 tablet 0   Coenzyme Q10 300 MG CAPS Take 300 mg by mouth daily.      doxycycline (VIBRAMYCIN) 100 MG capsule Take 100 mg by mouth 2 (two) times daily.     DULoxetine (CYMBALTA) 30 MG capsule Take 30 mg by mouth daily. (Take with 60mg  capsule to equal 90mg )  11   DULoxetine (CYMBALTA) 60 MG capsule Take 60 mg by mouth daily. (Take with 30mg  capsule to equal  90mg )     enalapril (VASOTEC) 5 MG tablet Take 1 tablet (5 mg total) by mouth daily. PLEASE KEEP UPCOMING APPT ON 10/08 FOR FURTHER REFILLS (FIRST ATTEMPT) 30 tablet 0   feeding supplement (ENSURE ENLIVE / ENSURE PLUS) LIQD Take 237 mLs by mouth daily.     fexofenadine (ALLEGRA) 180 MG tablet Take 180 mg by mouth daily.     fluticasone (FLONASE) 50 MCG/ACT nasal spray Place 2 sprays into the nose 2 (two) times daily as needed for allergies or rhinitis.     fluticasone furoate-vilanterol (BREO ELLIPTA) 200-25 MCG/INH AEPB Inhale 1 puff into the lungs daily.     furosemide (LASIX) 20 MG tablet Take 1 tablet (20 mg total) by mouth daily as needed for edema. 30 tablet 11   HYDROcodone bit-homatropine (HYCODAN) 5-1.5 MG/5ML syrup Take 5 mLs by mouth every 6 (six) hours as needed.      isosorbide mononitrate (IMDUR) 30 MG 24 hr tablet Take 0.5 tablets (15 mg total) by mouth daily. PLEASE CALL OFFICE TO SCHEDULE APPOINTMENT PRIOR TO NEXT REFILL 45 tablet 0   lidocaine-prilocaine (EMLA) cream Apply 1 application  topically as needed (for port access).     Magnesium Oxide 250 MG TABS Take 250 mg by mouth daily.     melatonin 5 MG TABS Take 0.5 tablets (2.5 mg total) by mouth at bedtime as needed. 30 tablet 0   montelukast (SINGULAIR) 10 MG tablet Take 10 mg by mouth at bedtime.      pantoprazole (PROTONIX) 40 MG tablet Take 1 tablet (40 mg total) by mouth daily. PLEASE CALL OFFICE TO SCHEDULE APPOINTMENT PRIOR TO NEXT REFILL 90 tablet 0   rosuvastatin (CRESTOR) 20 MG tablet Take 1 tablet (20 mg total) by mouth daily. 90 tablet 0   spironolactone (ALDACTONE) 25 MG tablet Take 1 tablet (25 mg total) by mouth daily. PLEASE CALL OFFICE TO SCHEDULE APPOINTMENT PRIOR TO NEXT REFILL 90 tablet 0   tiotropium (SPIRIVA) 18 MCG inhalation capsule Place 18 mcg into inhaler and inhale daily.     letrozole (FEMARA) 2.5 MG tablet TAKE 1 TABLET BY MOUTH EVERY DAY (Patient not taking: Reported on 01/30/2023) 90 tablet 0   predniSONE (DELTASONE) 5 MG tablet Take by mouth. (Patient not taking: Reported on 01/30/2023)     No current facility-administered medications for this visit.   Facility-Administered Medications Ordered in Other Visits  Medication Dose Route Frequency Provider Last Rate Last Admin   heparin lock flush 100 unit/mL  500 Units Intravenous Once Jeralyn Ruths, MD       heparin lock flush 100 unit/mL  500 Units Intravenous Once Jeralyn Ruths, MD       ondansetron [facility]) 8 mg in sodium chloride 0.9 % 50 mL IVPB   Intravenous Once Jeralyn Ruths, MD         Allergies:   Nsaids, Penicillins, Tamsulosin, Atorvastatin, Gabapentin, Ezetimibe, and Aspirin   Social History:  The patient  reports that she quit smoking about 34 years ago. Her smoking use included  cigarettes. She has never used smokeless tobacco. She reports current alcohol use. She reports that she does not use drugs.   Family History:   family history includes Colon cancer in her mother.    Review of Systems: Review of Systems  Constitutional: Negative.   HENT: Negative.    Respiratory:  Positive for cough. Negative for shortness of breath.   Cardiovascular: Negative.  Negative for chest pain.  Gastrointestinal: Negative.  Genitourinary: Negative.   Musculoskeletal: Negative.   Neurological: Negative.   Psychiatric/Behavioral: Negative.    All other systems reviewed and are negative.   PHYSICAL EXAM: VS:  BP 128/70 (BP Location: Left Arm, Patient Position: Sitting, Cuff Size: Normal)   Pulse 75   Ht 5\' 2"  (1.575 m)   Wt 152 lb (68.9 kg)   SpO2 98%   BMI 27.80 kg/m  , BMI Body mass index is 27.8 kg/m.  Constitutional:  oriented to person, place, and time. No distress.  HENT:  Head: Grossly normal Eyes:  no discharge. No scleral icterus.  Neck: No JVD, no carotid bruits  Cardiovascular: Regular rate and rhythm, no murmurs appreciated Pulmonary/Chest: Clear to auscultation bilaterally, no wheezes or rails Abdominal: Soft.  no distension.  no tenderness.  Musculoskeletal: Normal range of motion Neurological:  normal muscle tone. Coordination normal. No atrophy Skin: Skin warm and dry Psychiatric: normal affect, pleasant  Recent Labs: 08/15/2022: ALT 8; BUN 33; Creatinine, Ser 1.32; Hemoglobin 12.4; Platelets 346; Potassium 5.2; Sodium 140    Lipid Panel Lab Results  Component Value Date   CHOL 189 04/10/2018   HDL 40 (L) 04/10/2018   LDLCALC 134 (H) 04/10/2018   TRIG 77 04/10/2018     Wt Readings from Last 3 Encounters:  01/30/23 152 lb (68.9 kg)  08/15/22 145 lb (65.8 kg)  05/16/22 145 lb 1.6 oz (65.8 kg)     ASSESSMENT AND PLAN:  Coronary artery disease involving native coronary artery of native heart with chronic stable angina pectoris  (HCC) Reports having chest pain symptoms concerning for angina Given underlying chronic renal sufficiency, we will order pharmacologic Myoview to rule out ischemia  Non-STEMI (non-ST elevated myocardial infarction) (HCC) Prior ejection fraction 30 to 35% echocardiogram 09/2019 normal 50 to 55% Most recent echocardiogram ,  EF greater than 55% Appears euvolemic on today's visit  Centrilobular emphysema (HCC) Long history of smoking, followed by pulmonary Chronic bronchiectasis on nebulizers, inhalers significant cough in exam room, reports this is her baseline  CRI Followed by primary care Creatinine 1.5  Lasix as needed  Debility Limited exercise capacity in the setting of chronic cough, shortness of breath/ED Reports leg strength is stable  Leg edema Minimal swelling around the ankles Recommend she continue Lasix 5 days a week Ankle swelling worse from prednisone Leg elevation recommended, compression hose,  History of cerebellar strokes Currently on Plavix She did not complete her Zio monitor  Total encounter time more than 40 minutes. Greater than 50% was spent in counseling and coordination of care with the patient.    Signed, Dossie Arbour, M.D., Ph.D. 01/30/2023  [facility] [place], [place] [phone]

## 2023-01-30 ENCOUNTER — Ambulatory Visit: Payer: Medicare Other | Attending: Cardiovascular Disease | Admitting: Cardiovascular Disease

## 2023-01-30 VITALS — BP 128/70 | HR 75 | Ht 62.0 in | Wt 152.0 lb

## 2023-01-30 DIAGNOSIS — I255 Ischemic cardiomyopathy: Secondary | ICD-10-CM | POA: Diagnosis not present

## 2023-01-30 DIAGNOSIS — E782 Mixed hyperlipidemia: Secondary | ICD-10-CM | POA: Diagnosis not present

## 2023-01-30 DIAGNOSIS — I10 Essential (primary) hypertension: Secondary | ICD-10-CM

## 2023-01-30 DIAGNOSIS — R0602 Shortness of breath: Secondary | ICD-10-CM

## 2023-01-30 DIAGNOSIS — I5032 Chronic diastolic (congestive) heart failure: Secondary | ICD-10-CM | POA: Diagnosis not present

## 2023-01-30 DIAGNOSIS — I25119 Atherosclerotic heart disease of native coronary artery with unspecified angina pectoris: Secondary | ICD-10-CM

## 2023-01-30 DIAGNOSIS — I209 Angina pectoris, unspecified: Secondary | ICD-10-CM

## 2023-01-30 MED ORDER — ISOSORBIDE MONONITRATE ER 30 MG PO TB24: 30.0000 mg | ORAL_TABLET | Freq: Every day | ORAL | 3 refills | Status: DC

## 2023-01-30 MED ORDER — NITROGLYCERIN 0.4 MG SL SUBL: 0.4000 mg | SUBLINGUAL_TABLET | SUBLINGUAL | 3 refills | Status: AC | PRN

## 2023-01-30 NOTE — Patient Instructions (Addendum)
Medication Instructions:  Please increase the imdur/isosorbide up to 30 mg daily  If you need a refill on your cardiac medications before your next appointment, please call your pharmacy.   Lab work: No new labs needed  Testing/Procedures: Your provider has ordered a Lexiscan/ Exercise Myoview Stress test. This will take place at [facility]. Please report to the [facility] medical mall entrance. The volunteers at the first desk will direct you where to go.  [facility] MYOVIEW  Your provider has ordered a Stress Test with nuclear imaging. The purpose of this test is to evaluate the blood supply to your heart muscle. This procedure is referred to as a "Non-Invasive Stress Test." This is because other than having an IV started in your vein, nothing is inserted or "invades" your body. Cardiac stress tests are done to find areas of poor blood flow to the heart by determining the extent of coronary artery disease (CAD). Some patients exercise on a treadmill, which naturally increases the blood flow to your heart, while others who are unable to walk on a treadmill due to physical limitations will have a pharmacologic/chemical stress agent called Lexiscan . This medicine will mimic walking on a treadmill by temporarily increasing your coronary blood flow.   Please note: these test may take anywhere between 2-4 hours to complete  How to prepare for your Myoview test:  Nothing to eat for 6 hours prior to the test No caffeine for 24 hours prior to test No smoking 24 hours prior to test. Your medication may be taken with water.  If your doctor stopped a medication because of this test, do not take that medication. Ladies, please do not wear dresses.  Skirts or pants are appropriate. Please wear a short sleeve shirt. No perfume, cologne or lotion. Wear comfortable walking shoes. No heels!   PLEASE NOTIFY THE OFFICE AT LEAST 24 HOURS IN ADVANCE IF YOU ARE UNABLE TO KEEP YOUR APPOINTMENT.  [phone] AND  PLEASE  NOTIFY NUCLEAR MEDICINE AT [facility] AT LEAST 24 HOURS IN ADVANCE IF YOU ARE UNABLE TO KEEP YOUR APPOINTMENT. [phone]   Follow-Up: At [facility], you and your health needs are our priority.  As part of our continuing mission to provide you with exceptional heart care, we have created designated Provider Care Teams.  These Care Teams include your primary Cardiologist (physician) and Advanced Practice Providers (APPs -  Physician Assistants and Nurse Practitioners) who all work together to provide you with the care you need, when you need it.  You will need a follow up appointment in 6 months, APP ok  Providers on your designated Care Team:   Nicolasa Ducking, NP Eula Listen, PA-C Cadence Fransico Michael, [place]  COVID-19 Vaccine Information can be found at: [email or web address] For questions related to vaccine distribution or appointments, please email [email or web address] or call [phone].

## 2023-02-06 ENCOUNTER — Ambulatory Visit
Admission: RE | Admit: 2023-02-06 | Discharge: 2023-02-06 | Disposition: A | Discharge status: Home / Self Care | Payer: Medicare Other | Source: Ambulatory Visit | Attending: Cardiovascular Disease | Admitting: Cardiovascular Disease

## 2023-02-06 DIAGNOSIS — I209 Angina pectoris, unspecified: Secondary | ICD-10-CM | POA: Diagnosis present

## 2023-02-06 DIAGNOSIS — R0602 Shortness of breath: Secondary | ICD-10-CM | POA: Diagnosis present

## 2023-02-06 DIAGNOSIS — I25119 Atherosclerotic heart disease of native coronary artery with unspecified angina pectoris: Secondary | ICD-10-CM | POA: Insufficient documentation

## 2023-02-06 LAB — NM MYOCAR MULTI W/SPECT W/WALL MOTION / EF
LV dias vol: 44 mL (ref 46–106)
LV sys vol: 14 mL
Nuc Stress EF: 68 %
Rest Nuclear Isotope Dose: 10.7 mCi
SDS: 0
SRS: 5
SSS: 0
ST Depression (mm): 0 mm
Stress Nuclear Isotope Dose: 30.8 mCi
TID: 1.05

## 2023-02-06 MED ORDER — TECHNETIUM TC 99M TETROFOSMIN IV KIT
30.8300 | PACK | Freq: Once | INTRAVENOUS | Status: AC | PRN
  Administered 2023-02-06: 30.83 via INTRAVENOUS

## 2023-02-06 MED ORDER — TECHNETIUM TC 99M TETROFOSMIN IV KIT
10.0000 | PACK | Freq: Once | INTRAVENOUS | Status: AC | PRN
  Administered 2023-02-06: 10.72 via INTRAVENOUS

## 2023-02-06 MED ORDER — REGADENOSON 0.4 MG/5ML IV SOLN
0.4000 mg | Freq: Once | INTRAVENOUS | Status: AC
  Administered 2023-02-06: 0.4 mg via INTRAVENOUS

## 2023-02-14 ENCOUNTER — Other Ambulatory Visit: Payer: Self-pay | Admitting: Cardiovascular Disease

## 2023-03-09 ENCOUNTER — Telehealth: Payer: Self-pay | Admitting: Cardiovascular Disease

## 2023-03-09 MED ORDER — ISOSORBIDE MONONITRATE ER 30 MG PO TB24: 30.0000 mg | ORAL_TABLET | Freq: Every day | ORAL | 3 refills | Status: DC

## 2023-03-09 NOTE — Telephone Encounter (Signed)
*  STAT* If patient is at the pharmacy, call can be transferred to refill team.   1. Which medications need to be refilled? (please list name of each medication and dose if known) isosorbide mononitrate (IMDUR) 30 MG 24 hr tablet   2. Which pharmacy/location (including street and city if local pharmacy) is medication to be sent to?  [id number] - [place], [place] - [address]      3. Do they need a 30 day or 90 day supply? 90 day    Pt is out of medication

## 2023-03-09 NOTE — Telephone Encounter (Signed)
 RX sent to requested Pharmacy

## 2023-04-02 ENCOUNTER — Other Ambulatory Visit: Payer: Self-pay | Admitting: Cardiovascular Disease

## 2023-04-15 ENCOUNTER — Other Ambulatory Visit: Payer: Self-pay | Admitting: Cardiovascular Disease

## 2023-04-24 ENCOUNTER — Other Ambulatory Visit: Payer: Self-pay | Admitting: Cardiovascular Disease

## 2023-04-24 DIAGNOSIS — I2510 Atherosclerotic heart disease of native coronary artery without angina pectoris: Secondary | ICD-10-CM

## 2023-06-26 ENCOUNTER — Other Ambulatory Visit: Payer: Self-pay | Admitting: Family Medicine

## 2023-06-26 DIAGNOSIS — Z1231 Encounter for screening mammogram for malignant neoplasm of breast: Secondary | ICD-10-CM

## 2023-07-13 ENCOUNTER — Ambulatory Visit
Admission: RE | Admit: 2023-07-13 | Discharge: 2023-07-13 | Disposition: A | Discharge status: Home / Self Care | Source: Ambulatory Visit | Attending: Family Medicine | Admitting: Family Medicine

## 2023-07-13 DIAGNOSIS — Z1231 Encounter for screening mammogram for malignant neoplasm of breast: Secondary | ICD-10-CM | POA: Insufficient documentation

## 2023-07-16 NOTE — Progress Notes (Unsigned)
 Cardiology Office Note  Date:  07/17/2023   ID:  Krista Cisneros, Krista Cisneros Oct 30, 1940, MRN [id number]  PCP:  Rayetta Humphrey, MD   Chief Complaint  Patient presents with   6 month follow up     Patient c/o shortness of breath with little to no exertion & bilateral LE edema.     HPI: Krista Cisneros is a 83 y.o. 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 Meredeth Ide,  Planning on traveling to [place] this weekend  Weight trending upwards, worsening leg swelling Not taking Lasix Weight up 13 pounds over the past year, 6-1/2 pounds from last October  Reports she is not very active at baseline  Echo  normal LV function, EF greater than 55%, normal RV function, no indication of valvular heart disease or elevated right heart pressures  EKG personally reviewed by myself on todays visit EKG Interpretation Date/Time:  Tuesday July 17 2023 15:12:24 EDT Ventricular Rate:  88 PR Interval:  166 QRS Duration:  64 QT Interval:  336 QTC Calculation: 406 R Axis:   -8  Text Interpretation: Normal sinus rhythm When compared with ECG of 30-Jan-2023 10:22, No significant change was found Confirmed by Julien Nordmann [id number]) on 07/17/2023 3:23:47 PM    Head MRI  1. No acute intracranial abnormality. 2. Multiple old cerebellar infarcts and findings of  chronic small vessel disease.  COVID December 2021 Diagnosis of bronchopneumonia June 20, 2020 on Levaquin  Last echocardiogram June 2021, ejection fraction 50 to 55%  cath 04/10/18 Multivessel disease Severe mid LAD disease 95%, mid to distal stenosis as well 80% Proximal OM 3 disease large vessel 95% Moderate RCA disease 50% mid vessel  Successful angioplasty and drug-eluting stent placement to the LAD x2.  1. 90% mid LCx stenosis, unchanged from catheterization last month. 2. Widely patent proximal and mid LAD stents. 3. Successful PCI to mid LCx using Resolute Onyx 2.25 x 15 mm drug-eluting stent (postdilated to 2.6 mm) with 0% residual stenosis and TIMI-3 flow.   On 05/14/2018 NSTEMI along with chest pain and in worsening fatigue.   cardiac catheterization results detailed below, discussed with her 2 stents to the LAD, staged procedure with follow-up stent to the left circumflex  Ejection fraction of 30 to 35% by echocardiogram April 10, 2018   PMH:   has a past medical history of Anxiety, Arthritis, CAD (coronary artery disease), Cancer (HCC), Chronic systolic CHF (congestive heart failure) (HCC), COPD (chronic obstructive pulmonary disease) (HCC), Depression, Dyspnea, GERD (gastroesophageal reflux disease), Hyperlipidemia, Hypertension, Personal history of chemotherapy (2018), and Personal history of radiation therapy (2019).  PSH:    Past Surgical History:  Procedure Laterality Date   ABDOMINAL HYSTERECTOMY  1990   Partial   BREAST BIOPSY Right 10/04/2016   axilla lymph node (metastatic carcinoma) and axillay tail mass biopsy-invasive mammary carcinoma   BREAST  BIOPSY Right 07/11/2022   [place] Core Bx coil clip- - ORGANIZING FAT NECROSIS WITH CALCIFICATION. -   BREAST BIOPSY Right 07/11/2022   [place] RT BREAST BX W LOC DEV 1ST LESION IMG BX SPEC US GUIDE 07/11/2022 [facility]   BREAST LUMPECTOMY Right 03/21/2017   chemo first, [facility] and metastatic LN   BREAST  LUMPECTOMY Right 05/21/2017   re-excision for clip   BREAST LUMPECTOMY WITH NEEDLE LOCALIZATION Right 03/21/2017   Procedure: BREAST LUMPECTOMY WITH NEEDLE LOCALIZATION;  Surgeon: Ricarda Frame, MD;  Location: [facility];  Service: General;  Laterality: Right;   CORONARY STENT INTERVENTION N/A 04/10/2018   Procedure: CORONARY STENT INTERVENTION;  Surgeon: Iran Ouch, MD;  Location: [facility];  Service: Cardiovascular;  Laterality: N/A;   CORONARY STENT INTERVENTION N/A 05/14/2018   Procedure: CORONARY STENT INTERVENTION;  Surgeon: Yvonne Kendall, MD;  Location: [facility];  Service: Cardiovascular;  Laterality: N/A;   DILATION AND CURETTAGE OF UTERUS     INGUINAL HERNIA REPAIR Right 03/26/2017   Procedure: HERNIA REPAIR INGUINAL INCARCERATED;  Surgeon: Leafy Ro, MD;  Location: [facility];  Service: General;  Laterality: Right;   LEFT HEART CATH AND CORONARY ANGIOGRAPHY N/A 04/10/2018   Procedure: LEFT HEART CATH AND CORONARY ANGIOGRAPHY poss PCI;  Surgeon: Antonieta Iba, MD;  Location: [facility];  Service: Cardiovascular;  Laterality: N/A;   PORTA CATH REMOVAL N/A 12/06/2020   Procedure: PORTA CATH REMOVAL;  Surgeon: Annice Needy, MD;  Location: [facility];  Service: Cardiovascular;  Laterality: N/A;   PORTACATH PLACEMENT Left 10/24/2016   Procedure: INSERTION PORT-A-CATH;  Surgeon: Hulda Marin, MD;  Location: [facility];  Service: General;  Laterality: Left;   RE-EXCISION OF BREAST LUMPECTOMY Right 05/21/2017   Procedure: RE-EXCISION OF BREAST LUMPECTOMY;  Surgeon: Ricarda Frame, MD;  Location: [facility];  Service: General;  Laterality: Right;   SENTINEL NODE BIOPSY Right 03/21/2017   Procedure: SENTINEL NODE BIOPSY;  Surgeon: Ricarda Frame, MD;  Location: [facility];  Service: General;  Laterality: Right;    Current Outpatient Medications  Medication Sig Dispense Refill   acetaminophen (ACETAMINOPHEN 8 HOUR) 650 MG CR tablet  Take 650 mg by mouth every 8 (eight) hours as needed for pain.     albuterol (PROVENTIL HFA;VENTOLIN HFA) 108 (90 Base) MCG/ACT inhaler Inhale 2 puffs into the lungs every 6 (six) hours as needed for wheezing or shortness of breath.     baclofen (LIORESAL) 10 MG tablet TAKE 1 TABLET BY MOUTH EVERY DAY AT NIGHT     carvedilol (COREG) 6.25 MG tablet Take 6.25 mg by mouth 2 (two) times daily.     cholecalciferol (VITAMIN D3) 25 MCG (1000 UT) tablet Take 1,000 Units by mouth daily.     clopidogrel (PLAVIX) 75 MG tablet Take 1 tablet (75 mg total) by mouth daily with breakfast. 90 tablet 1   Coenzyme Q10 300 MG CAPS Take 300 mg by mouth daily.      doxycycline (VIBRAMYCIN) 100 MG capsule Take 100 mg by mouth 2 (two) times daily.     DULoxetine (CYMBALTA) 30 MG capsule Take 30 mg by mouth daily. (Take with 60mg  capsule to equal 90mg )  11   DULoxetine (CYMBALTA) 60 MG capsule Take 60 mg by mouth daily. (Take with 30mg  capsule to equal 90mg )     enalapril (VASOTEC) 5 MG tablet Take 1 tablet (5 mg total) by mouth daily. 90 tablet 2   feeding supplement (ENSURE ENLIVE /  ENSURE PLUS) LIQD Take 237 mLs by mouth daily.     fexofenadine (ALLEGRA) 180 MG tablet Take 180 mg by mouth daily.     fluticasone (FLONASE) 50 MCG/ACT nasal spray Place 2 sprays into the nose 2 (two) times daily as needed for allergies or rhinitis.     fluticasone furoate-vilanterol (BREO ELLIPTA) 200-25 MCG/INH AEPB Inhale 1 puff into the lungs daily.     furosemide (LASIX) 20 MG tablet Take 1 tablet (20 mg total) by mouth daily as needed for edema. 30 tablet 11   HYDROcodone bit-homatropine (HYCODAN) 5-1.5 MG/5ML syrup Take 5 mLs by mouth every 6 (six) hours as needed.     isosorbide mononitrate (IMDUR) 30 MG 24 hr tablet Take 1 tablet (30 mg total) by mouth daily. 90 tablet 3   letrozole (FEMARA) 2.5 MG tablet TAKE 1 TABLET BY MOUTH EVERY DAY 90 tablet 0   lidocaine-prilocaine (EMLA) cream Apply 1 application  topically as needed (for  port access).     Magnesium Oxide 250 MG TABS Take 250 mg by mouth daily.     melatonin 5 MG TABS Take 0.5 tablets (2.5 mg total) by mouth at bedtime as needed. 30 tablet 0   montelukast (SINGULAIR) 10 MG tablet Take 10 mg by mouth at bedtime.      nitroGLYCERIN (NITROSTAT) 0.4 MG SL tablet Place 1 tablet (0.4 mg total) under the tongue every 5 (five) minutes as needed for chest pain. 25 tablet 3   pantoprazole (PROTONIX) 40 MG tablet Take 1 tablet (40 mg total) by mouth daily. 90 tablet 0   predniSONE (DELTASONE) 5 MG tablet Take by mouth.     rosuvastatin (CRESTOR) 20 MG tablet TAKE 1 TABLET BY MOUTH EVERY DAY 90 tablet 0   spironolactone (ALDACTONE) 25 MG tablet Take 1 tablet (25 mg total) by mouth daily. 90 tablet 1   tiotropium (SPIRIVA) 18 MCG inhalation capsule Place 18 mcg into inhaler and inhale daily.     No current facility-administered medications for this visit.   Facility-Administered Medications Ordered in Other Visits  Medication Dose Route Frequency Provider Last Rate Last Admin   heparin lock flush 100 unit/mL  500 Units Intravenous Once Jeralyn Ruths, MD       heparin lock flush 100 unit/mL  500 Units Intravenous Once Jeralyn Ruths, MD       ondansetron [facility]) 8 mg in sodium chloride 0.9 % 50 mL IVPB   Intravenous Once Jeralyn Ruths, MD         Allergies:   Nsaids, Penicillins, Tamsulosin, Atorvastatin, Gabapentin, Ezetimibe, and Aspirin   Social History:  The patient  reports that she quit smoking about 35 years ago. Her smoking use included cigarettes. She has never used smokeless tobacco. She reports current alcohol use. She reports that she does not use drugs.   Family History:   family history includes Colon cancer in her mother.    Review of Systems: Review of Systems  Constitutional: Negative.   HENT: Negative.    Respiratory:  Positive for cough. Negative for shortness of breath.   Cardiovascular: Negative.  Negative for chest pain.   Gastrointestinal: Negative.   Genitourinary: Negative.   Musculoskeletal: Negative.   Neurological: Negative.   Psychiatric/Behavioral: Negative.    All other systems reviewed and are negative.   PHYSICAL EXAM: VS:  BP 120/80 (BP Location: Left Arm, Patient Position: Sitting, Cuff Size: Normal)   Pulse 88   Ht 5\' 2"  (1.575 m)  Wt 158 lb 6 oz (71.8 kg)   SpO2 93%   BMI 28.97 kg/m  , BMI Body mass index is 28.97 kg/m.  Constitutional:  oriented to person, place, and time. No distress.  HENT:  Head: Grossly normal Eyes:  no discharge. No scleral icterus.  Neck: No JVD, no carotid bruits  Cardiovascular: Regular rate and rhythm, no murmurs appreciated Pulmonary/Chest: Coarse breath sounds bilaterally Abdominal: Soft.  no distension.  no tenderness.  Musculoskeletal: Normal range of motion Neurological:  normal muscle tone. Coordination normal. No atrophy Skin: Skin warm and dry Psychiatric: normal affect, pleasant  Recent Labs: 08/15/2022: ALT 8; BUN 33; Creatinine, Ser 1.32; Hemoglobin 12.4; Platelets 346; Potassium 5.2; Sodium 140    Lipid Panel Lab Results  Component Value Date   CHOL 189 04/10/2018   HDL 40 (L) 04/10/2018   LDLCALC 134 (H) 04/10/2018   TRIG 77 04/10/2018     Wt Readings from Last 3 Encounters:  07/17/23 158 lb 6 oz (71.8 kg)  01/30/23 152 lb (68.9 kg)  08/15/22 145 lb (65.8 kg)     ASSESSMENT AND PLAN:  Coronary artery disease involving native coronary artery of native heart with chronic stable angina pectoris (HCC) Currently with no symptoms of angina. No further workup at this time. Continue current medication regimen.  Stress test October 2024, low risk  Non-STEMI (non-ST elevated myocardial infarction) (HCC) Prior ejection fraction 30 to 35% echocardiogram 09/2019 normal 50 to 55% echocardiogram ,  EF greater than 55% Recommend she take Lasix several days per week given leg swelling, significant weight gain, worsening respiratory  status, concern for diastolic CHF  Centrilobular emphysema (HCC) Long history of smoking, followed by pulmonary Clearly with COPD exacerbation on today's visit, seen by pulmonary scheduled to start prednisone and doxycycline today per Dr. Meredeth Ide Recommend she avoid long distance travel until her respiratory status has improved Reports she was scheduled to travel to [place] this weekend in 5 days time  CRI Followed by primary care Creatinine stable if not improving over the past year  Debility Limited by chronic shortness of breath, leg weakness  Leg edema Previously on Lasix 5 days a week, reports she is not taking Lasix at all Dramatic weight gain over the past year Recommend she consider taking Lasix several days per week  History of cerebellar strokes Currently on Plavix She did not complete her Zio monitor    Signed, Dossie Arbour, M.D., Ph.D. 07/17/2023  [facility] [place], [place] [phone]

## 2023-07-17 ENCOUNTER — Encounter: Payer: Self-pay | Admitting: Cardiovascular Disease

## 2023-07-17 ENCOUNTER — Ambulatory Visit: Payer: Medicare Other | Attending: Cardiovascular Disease | Admitting: Cardiovascular Disease

## 2023-07-17 VITALS — BP 120/80 | HR 88 | Ht 62.0 in | Wt 158.4 lb

## 2023-07-17 DIAGNOSIS — I25119 Atherosclerotic heart disease of native coronary artery with unspecified angina pectoris: Secondary | ICD-10-CM

## 2023-07-17 DIAGNOSIS — I5032 Chronic diastolic (congestive) heart failure: Secondary | ICD-10-CM | POA: Diagnosis not present

## 2023-07-17 DIAGNOSIS — I255 Ischemic cardiomyopathy: Secondary | ICD-10-CM

## 2023-07-17 DIAGNOSIS — E782 Mixed hyperlipidemia: Secondary | ICD-10-CM | POA: Diagnosis not present

## 2023-07-17 DIAGNOSIS — I639 Cerebral infarction, unspecified: Secondary | ICD-10-CM

## 2023-07-17 DIAGNOSIS — I10 Essential (primary) hypertension: Secondary | ICD-10-CM

## 2023-07-17 DIAGNOSIS — I209 Angina pectoris, unspecified: Secondary | ICD-10-CM

## 2023-07-17 DIAGNOSIS — R0602 Shortness of breath: Secondary | ICD-10-CM

## 2023-07-17 MED ORDER — FUROSEMIDE 20 MG PO TABS: 20.0000 mg | ORAL_TABLET | Freq: Every day | ORAL | 3 refills | Status: AC | PRN

## 2023-07-17 NOTE — Patient Instructions (Addendum)
 Medication Instructions:  No changes  Lasix daily as needed for swelling and breath  If you need a refill on your cardiac medications before your next appointment, please call your pharmacy.   Lab work: No new labs needed  Testing/Procedures: No new testing needed  Follow-Up: At [facility], you and your health needs are our priority.  As part of our continuing mission to provide you with exceptional heart care, we have created designated Provider Care Teams.  These Care Teams include your primary Cardiologist (physician) and Advanced Practice Providers (APPs -  Physician Assistants and Nurse Practitioners) who all work together to provide you with the care you need, when you need it.  You will need a follow up appointment in 6 months, APP ok  Providers on your designated Care Team:   Nicolasa Ducking, NP Eula Listen, PA-C Cadence Fransico Michael, [place]  COVID-19 Vaccine Information can be found at: [email or web address] For questions related to vaccine distribution or appointments, please email [email or web address] or call [phone].

## 2023-07-18 ENCOUNTER — Other Ambulatory Visit: Payer: Self-pay | Admitting: Family Medicine

## 2023-07-18 DIAGNOSIS — R928 Other abnormal and inconclusive findings on diagnostic imaging of breast: Secondary | ICD-10-CM

## 2023-07-24 ENCOUNTER — Other Ambulatory Visit: Payer: Self-pay | Admitting: Cardiovascular Disease

## 2023-07-24 DIAGNOSIS — I2510 Atherosclerotic heart disease of native coronary artery without angina pectoris: Secondary | ICD-10-CM

## 2023-07-31 ENCOUNTER — Ambulatory Visit
Admission: RE | Admit: 2023-07-31 | Discharge: 2023-07-31 | Disposition: A | Discharge status: Home / Self Care | Source: Ambulatory Visit | Attending: Family Medicine | Admitting: Family Medicine

## 2023-07-31 DIAGNOSIS — R928 Other abnormal and inconclusive findings on diagnostic imaging of breast: Secondary | ICD-10-CM

## 2023-08-01 ENCOUNTER — Other Ambulatory Visit: Payer: Self-pay | Admitting: Family Medicine

## 2023-08-01 DIAGNOSIS — R928 Other abnormal and inconclusive findings on diagnostic imaging of breast: Secondary | ICD-10-CM

## 2023-08-08 ENCOUNTER — Encounter: Payer: Self-pay | Admitting: Physician Assistant

## 2023-08-16 ENCOUNTER — Inpatient Hospital Stay: Admission: RE | Admit: 2023-08-16 | Source: Ambulatory Visit

## 2023-08-16 ENCOUNTER — Ambulatory Visit
Admission: RE | Admit: 2023-08-16 | Discharge: 2023-08-16 | Disposition: A | Discharge status: Home / Self Care | Source: Ambulatory Visit | Attending: Family Medicine | Admitting: Family Medicine

## 2023-08-16 DIAGNOSIS — N641 Fat necrosis of breast: Secondary | ICD-10-CM | POA: Insufficient documentation

## 2023-08-16 DIAGNOSIS — R928 Other abnormal and inconclusive findings on diagnostic imaging of breast: Secondary | ICD-10-CM

## 2023-08-16 HISTORY — PX: BREAST BIOPSY: SHX20

## 2023-08-16 MED ORDER — LIDOCAINE 1 % OPTIME INJ - NO CHARGE
2.0000 mL | Freq: Once | INTRAMUSCULAR | Status: AC
  Administered 2023-08-16: 2 mL
  Filled 2023-08-16: qty 2

## 2023-08-16 MED ORDER — LIDOCAINE-EPINEPHRINE 1 %-1:100000 IJ SOLN
8.0000 mL | Freq: Once | INTRAMUSCULAR | Status: AC
  Administered 2023-08-16: 8 mL
  Filled 2023-08-16: qty 8

## 2023-08-17 LAB — SURGICAL PATHOLOGY

## 2023-08-30 ENCOUNTER — Encounter: Payer: Self-pay | Admitting: Cardiovascular Disease

## 2023-10-02 ENCOUNTER — Other Ambulatory Visit: Payer: Self-pay | Admitting: Cardiovascular Disease

## 2023-10-24 ENCOUNTER — Other Ambulatory Visit: Payer: Self-pay | Admitting: Cardiovascular Disease

## 2023-11-04 ENCOUNTER — Other Ambulatory Visit: Payer: Self-pay | Admitting: Cardiovascular Disease

## 2024-04-23 ENCOUNTER — Other Ambulatory Visit: Payer: Self-pay | Admitting: Cardiovascular Disease

## 2024-04-24 ENCOUNTER — Other Ambulatory Visit: Payer: Self-pay | Admitting: Cardiovascular Disease

## 2024-04-25 ENCOUNTER — Other Ambulatory Visit: Payer: Self-pay | Admitting: Cardiovascular Disease
